# Patient Record
Sex: Male | Born: 1940 | Race: White | Hispanic: No | Smoking: Former smoker
Health system: Southern US, Community
[De-identification: ages and names within clinical notes are randomized; demographics above are authoritative.]

## PROBLEM LIST (undated history)

## (undated) DIAGNOSIS — M1990 Unspecified osteoarthritis, unspecified site: Secondary | ICD-10-CM

## (undated) DIAGNOSIS — T8859XA Other complications of anesthesia, initial encounter: Secondary | ICD-10-CM

## (undated) DIAGNOSIS — K219 Gastro-esophageal reflux disease without esophagitis: Secondary | ICD-10-CM

## (undated) DIAGNOSIS — T4145XA Adverse effect of unspecified anesthetic, initial encounter: Secondary | ICD-10-CM

## (undated) DIAGNOSIS — I82409 Acute embolism and thrombosis of unspecified deep veins of unspecified lower extremity: Secondary | ICD-10-CM

## (undated) DIAGNOSIS — G9520 Unspecified cord compression: Secondary | ICD-10-CM

## (undated) DIAGNOSIS — L0390 Cellulitis, unspecified: Secondary | ICD-10-CM

## (undated) DIAGNOSIS — I2699 Other pulmonary embolism without acute cor pulmonale: Secondary | ICD-10-CM

## (undated) DIAGNOSIS — R1310 Dysphagia, unspecified: Secondary | ICD-10-CM

## (undated) DIAGNOSIS — Z923 Personal history of irradiation: Secondary | ICD-10-CM

## (undated) DIAGNOSIS — L0291 Cutaneous abscess, unspecified: Secondary | ICD-10-CM

## (undated) DIAGNOSIS — C9000 Multiple myeloma not having achieved remission: Secondary | ICD-10-CM

## (undated) HISTORY — PX: BACK SURGERY: SHX140

## (undated) HISTORY — PX: FRACTURE SURGERY: SHX138

## (undated) HISTORY — PX: HEMORRHOID SURGERY: SHX153

## (undated) HISTORY — DX: Personal history of irradiation: Z92.3

---

## 2012-03-09 ENCOUNTER — Inpatient Hospital Stay (HOSPITAL_COMMUNITY): Payer: BC Managed Care – PPO

## 2012-03-09 ENCOUNTER — Encounter (HOSPITAL_BASED_OUTPATIENT_CLINIC_OR_DEPARTMENT_OTHER): Payer: Self-pay

## 2012-03-09 ENCOUNTER — Emergency Department (HOSPITAL_BASED_OUTPATIENT_CLINIC_OR_DEPARTMENT_OTHER): Payer: BC Managed Care – PPO

## 2012-03-09 ENCOUNTER — Inpatient Hospital Stay (HOSPITAL_BASED_OUTPATIENT_CLINIC_OR_DEPARTMENT_OTHER)
Admission: EM | Admit: 2012-03-09 | Discharge: 2012-03-24 | DRG: 401 | Disposition: A | Discharge status: Skilled Nursing Facility | Payer: BC Managed Care – PPO | Attending: Internal Medicine | Admitting: Internal Medicine

## 2012-03-09 DIAGNOSIS — IMO0002 Reserved for concepts with insufficient information to code with codable children: Secondary | ICD-10-CM

## 2012-03-09 DIAGNOSIS — K219 Gastro-esophageal reflux disease without esophagitis: Secondary | ICD-10-CM | POA: Diagnosis present

## 2012-03-09 DIAGNOSIS — M549 Dorsalgia, unspecified: Secondary | ICD-10-CM | POA: Diagnosis present

## 2012-03-09 DIAGNOSIS — E010 Iodine-deficiency related diffuse (endemic) goiter: Secondary | ICD-10-CM | POA: Diagnosis present

## 2012-03-09 DIAGNOSIS — I2510 Atherosclerotic heart disease of native coronary artery without angina pectoris: Secondary | ICD-10-CM | POA: Diagnosis present

## 2012-03-09 DIAGNOSIS — M899 Disorder of bone, unspecified: Secondary | ICD-10-CM | POA: Diagnosis present

## 2012-03-09 DIAGNOSIS — C9000 Multiple myeloma not having achieved remission: Principal | ICD-10-CM | POA: Diagnosis present

## 2012-03-09 DIAGNOSIS — R109 Unspecified abdominal pain: Secondary | ICD-10-CM | POA: Diagnosis present

## 2012-03-09 DIAGNOSIS — I10 Essential (primary) hypertension: Secondary | ICD-10-CM | POA: Diagnosis present

## 2012-03-09 DIAGNOSIS — E669 Obesity, unspecified: Secondary | ICD-10-CM | POA: Diagnosis present

## 2012-03-09 DIAGNOSIS — E049 Nontoxic goiter, unspecified: Secondary | ICD-10-CM | POA: Diagnosis present

## 2012-03-09 DIAGNOSIS — K5900 Constipation, unspecified: Secondary | ICD-10-CM | POA: Diagnosis present

## 2012-03-09 DIAGNOSIS — R7309 Other abnormal glucose: Secondary | ICD-10-CM | POA: Diagnosis present

## 2012-03-09 DIAGNOSIS — C7951 Secondary malignant neoplasm of bone: Secondary | ICD-10-CM

## 2012-03-09 DIAGNOSIS — K449 Diaphragmatic hernia without obstruction or gangrene: Secondary | ICD-10-CM | POA: Diagnosis present

## 2012-03-09 DIAGNOSIS — M4810 Ankylosing hyperostosis [Forestier], site unspecified: Secondary | ICD-10-CM

## 2012-03-09 DIAGNOSIS — E871 Hypo-osmolality and hyponatremia: Secondary | ICD-10-CM | POA: Diagnosis present

## 2012-03-09 HISTORY — DX: Other complications of anesthesia, initial encounter: T88.59XA

## 2012-03-09 HISTORY — DX: Unspecified osteoarthritis, unspecified site: M19.90

## 2012-03-09 HISTORY — DX: Adverse effect of unspecified anesthetic, initial encounter: T41.45XA

## 2012-03-09 HISTORY — DX: Gastro-esophageal reflux disease without esophagitis: K21.9

## 2012-03-09 LAB — COMPREHENSIVE METABOLIC PANEL
ALT: 51 U/L (ref 0–53)
Alkaline Phosphatase: 77 U/L (ref 39–117)
BUN: 13 mg/dL (ref 6–23)
CO2: 24 mEq/L (ref 19–32)
Calcium: 10.2 mg/dL (ref 8.4–10.5)
GFR calc Af Amer: 76 mL/min — ABNORMAL LOW (ref >90)
GFR calc non Af Amer: 66 mL/min — ABNORMAL LOW (ref >90)
Glucose, Bld: 111 mg/dL — ABNORMAL HIGH (ref 70–99)
Potassium: 3.8 mEq/L (ref 3.5–5.1)
Sodium: 135 mEq/L (ref 135–145)
Total Protein: 9.2 g/dL — ABNORMAL HIGH (ref 6.0–8.3)

## 2012-03-09 LAB — URINALYSIS, ROUTINE W REFLEX MICROSCOPIC
Bilirubin Urine: NEGATIVE
Glucose, UA: NEGATIVE mg/dL
Hgb urine dipstick: NEGATIVE
Specific Gravity, Urine: 1.036 — ABNORMAL HIGH (ref 1.005–1.030)
pH: 7 (ref 5.0–8.0)

## 2012-03-09 LAB — CBC
MCV: 91.2 fL (ref 78.0–100.0)
Platelets: 133 10*3/uL — ABNORMAL LOW (ref 150–400)
RDW: 13.5 % (ref 11.5–15.5)
WBC: 5.8 10*3/uL (ref 4.0–10.5)

## 2012-03-09 LAB — CBC WITH DIFFERENTIAL/PLATELET
Eosinophils Absolute: 0.1 10*3/uL (ref 0.0–0.7)
Eosinophils Relative: 2 % (ref 0–5)
Hemoglobin: 14.8 g/dL (ref 13.0–17.0)
Lymphocytes Relative: 24 % (ref 12–46)
Lymphs Abs: 1.4 10*3/uL (ref 0.7–4.0)
MCH: 32.7 pg (ref 26.0–34.0)
MCV: 89.8 fL (ref 78.0–100.0)
Monocytes Relative: 11 % (ref 3–12)
RBC: 4.52 MIL/uL (ref 4.22–5.81)
WBC: 5.9 10*3/uL (ref 4.0–10.5)

## 2012-03-09 LAB — CARDIAC PANEL(CRET KIN+CKTOT+MB+TROPI)
Relative Index: INVALID (ref 0.0–2.5)
Total CK: 80 U/L (ref 7–232)

## 2012-03-09 LAB — CREATININE, SERUM
Creatinine, Ser: 0.88 mg/dL (ref 0.50–1.35)
GFR calc Af Amer: >90 mL/min (ref >90)

## 2012-03-09 LAB — OCCULT BLOOD X 1 CARD TO LAB, STOOL: Fecal Occult Bld: NEGATIVE

## 2012-03-09 LAB — LIPASE, BLOOD: Lipase: 28 U/L (ref 11–59)

## 2012-03-09 MED ORDER — ONDANSETRON HCL 4 MG/2ML IJ SOLN
4.0000 mg | Freq: Once | INTRAMUSCULAR | Status: AC
  Administered 2012-03-09: 4 mg via INTRAVENOUS
  Filled 2012-03-09: qty 2

## 2012-03-09 MED ORDER — OXYCODONE HCL 5 MG PO TABS
5.0000 mg | ORAL_TABLET | ORAL | Status: DC | PRN
  Administered 2012-03-09 – 2012-03-24 (×30): 5 mg via ORAL
  Filled 2012-03-09 (×32): qty 1

## 2012-03-09 MED ORDER — SODIUM CHLORIDE 0.9 % IV BOLUS (SEPSIS)
1000.0000 mL | Freq: Once | INTRAVENOUS | Status: AC
  Administered 2012-03-09: 1000 mL via INTRAVENOUS

## 2012-03-09 MED ORDER — BISACODYL 10 MG RE SUPP: 10.0000 mg | Freq: Every day | RECTAL | Status: DC | PRN

## 2012-03-09 MED ORDER — ALUM & MAG HYDROXIDE-SIMETH 200-200-20 MG/5ML PO SUSP
30.0000 mL | ORAL | Status: DC | PRN
  Administered 2012-03-09 – 2012-03-24 (×11): 30 mL via ORAL
  Filled 2012-03-09 (×11): qty 30

## 2012-03-09 MED ORDER — MAGNESIUM CITRATE PO SOLN
1.0000 | Freq: Once | ORAL | Status: AC
  Administered 2012-03-09: 1 via ORAL

## 2012-03-09 MED ORDER — IOHEXOL 300 MG/ML  SOLN
100.0000 mL | Freq: Once | INTRAMUSCULAR | Status: AC | PRN
  Administered 2012-03-09: 100 mL via INTRAVENOUS

## 2012-03-09 MED ORDER — ONDANSETRON HCL 4 MG/2ML IJ SOLN
4.0000 mg | Freq: Four times a day (QID) | INTRAMUSCULAR | Status: DC | PRN
  Administered 2012-03-10 – 2012-03-24 (×3): 4 mg via INTRAVENOUS
  Filled 2012-03-09 (×3): qty 2

## 2012-03-09 MED ORDER — IOHEXOL 300 MG/ML  SOLN: 20.0000 mL | INTRAMUSCULAR | Status: DC

## 2012-03-09 MED ORDER — ACETAMINOPHEN 325 MG PO TABS: 650.0000 mg | ORAL_TABLET | Freq: Four times a day (QID) | ORAL | Status: DC | PRN

## 2012-03-09 MED ORDER — HYDROMORPHONE HCL PF 1 MG/ML IJ SOLN
1.0000 mg | Freq: Once | INTRAMUSCULAR | Status: AC
  Administered 2012-03-09: 1 mg via INTRAVENOUS
  Filled 2012-03-09: qty 1

## 2012-03-09 MED ORDER — SODIUM CHLORIDE 0.9 % IV SOLN: INTRAVENOUS | Status: DC

## 2012-03-09 MED ORDER — DOCUSATE SODIUM 100 MG PO CAPS
100.0000 mg | ORAL_CAPSULE | Freq: Two times a day (BID) | ORAL | Status: DC
  Administered 2012-03-09: 100 mg via ORAL
  Filled 2012-03-09 (×3): qty 1

## 2012-03-09 MED ORDER — ONDANSETRON HCL 4 MG PO TABS
4.0000 mg | ORAL_TABLET | Freq: Four times a day (QID) | ORAL | Status: DC | PRN
  Administered 2012-03-17: 4 mg via ORAL
  Filled 2012-03-09: qty 1

## 2012-03-09 MED ORDER — MAGNESIUM CITRATE PO SOLN
ORAL | Status: AC
  Filled 2012-03-09: qty 296

## 2012-03-09 MED ORDER — FLEET ENEMA 7-19 GM/118ML RE ENEM: 1.0000 | ENEMA | Freq: Once | RECTAL | Status: AC | PRN

## 2012-03-09 MED ORDER — SENNA 8.6 MG PO TABS
1.0000 | ORAL_TABLET | Freq: Every day | ORAL | Status: DC
  Administered 2012-03-09: 8.6 mg via ORAL
  Filled 2012-03-09: qty 1

## 2012-03-09 MED ORDER — ENOXAPARIN SODIUM 40 MG/0.4ML ~~LOC~~ SOLN: 40.0000 mg | SUBCUTANEOUS | Status: DC

## 2012-03-09 MED ORDER — MORPHINE SULFATE 4 MG/ML IJ SOLN
4.0000 mg | Freq: Once | INTRAMUSCULAR | Status: AC
  Administered 2012-03-09: 4 mg via INTRAVENOUS
  Filled 2012-03-09: qty 1

## 2012-03-09 MED ORDER — ZOLPIDEM TARTRATE 5 MG PO TABS
5.0000 mg | ORAL_TABLET | Freq: Once | ORAL | Status: AC
  Administered 2012-03-09: 5 mg via ORAL
  Filled 2012-03-09: qty 1

## 2012-03-09 MED ORDER — ENOXAPARIN SODIUM 60 MG/0.6ML ~~LOC~~ SOLN
60.0000 mg | SUBCUTANEOUS | Status: DC
  Administered 2012-03-09 – 2012-03-11 (×3): 60 mg via SUBCUTANEOUS
  Filled 2012-03-09 (×4): qty 0.6

## 2012-03-09 MED ORDER — ONDANSETRON HCL 4 MG/2ML IJ SOLN: 4.0000 mg | Freq: Three times a day (TID) | INTRAMUSCULAR | Status: DC | PRN

## 2012-03-09 MED ORDER — ACETAMINOPHEN 650 MG RE SUPP: 650.0000 mg | Freq: Four times a day (QID) | RECTAL | Status: DC | PRN

## 2012-03-09 MED ORDER — PANTOPRAZOLE SODIUM 40 MG PO TBEC
40.0000 mg | DELAYED_RELEASE_TABLET | Freq: Every day | ORAL | Status: DC
  Administered 2012-03-10 – 2012-03-24 (×15): 40 mg via ORAL
  Filled 2012-03-09 (×17): qty 1

## 2012-03-09 MED ORDER — SODIUM CHLORIDE 0.9 % IV SOLN
INTRAVENOUS | Status: DC
  Administered 2012-03-10 – 2012-03-11 (×2): via INTRAVENOUS

## 2012-03-09 NOTE — ED Notes (Signed)
Pt transported to and from CT.

## 2012-03-09 NOTE — ED Notes (Signed)
MD at bedside. 

## 2012-03-09 NOTE — ED Notes (Signed)
Urinal provided and pt drinking PO contrast. Wife at bedside.

## 2012-03-09 NOTE — ED Notes (Signed)
Pt reports abdominal pain x 1 week and constipation.

## 2012-03-09 NOTE — ED Provider Notes (Signed)
History     CSN: [id number]  Arrival date & time 03/09/12  1012   First MD Initiated Contact with Patient 03/09/12 1026      Chief Complaint  Patient presents with  . Abdominal Pain  . Constipation    (Consider location/radiation/quality/duration/timing/severity/associated sxs/prior treatment) HPI Comments: Patient presents with left-sided abdominal pain and constipation for the past week. He states 3 days ago and he felt constipated and took 3 laxatives. He had a bowel movement and then felt better. The pain returned shortly after the left side of his abdomen. He has not had a bowel movement since. Denies vomiting, fever, chest pain or shortness of breath. He has not had pain like this in the past. Denies any diarrhea. He was given pain medication for his back but has not had any more than 3 weeks. The pain is left paraspinal and has substantially improved. Is not radiate. No focal weakness, bowel or bladder incontinence, fever.  The history is provided by the patient.    Past Medical History  Diagnosis Date  . Arthritis   . GERD (gastroesophageal reflux disease)     Past Surgical History  Procedure Date  . Fracture surgery     No family history on file.  History  Substance Use Topics  . Smoking status: Former Games developer  . Smokeless tobacco: Never Used  . Alcohol Use: Yes     moderately      Review of Systems  Constitutional: Negative for fever, activity change and appetite change.  HENT: Negative for congestion and rhinorrhea.   Respiratory: Negative for cough, chest tightness and shortness of breath.   Cardiovascular: Negative for chest pain.  Gastrointestinal: Positive for abdominal pain and constipation. Negative for nausea, vomiting and diarrhea.  Genitourinary: Negative for dysuria.  Musculoskeletal: Negative for back pain.  Skin: Negative for rash.  Neurological: Negative for dizziness, weakness and headaches.    Allergies  Penicillins  Home Medications     Current Outpatient Rx  Name Route Sig Dispense Refill  . NAPROXEN 250 MG PO TABS Oral Take 250 mg by mouth 3 (three) times daily with meals.     . OMEPRAZOLE 20 MG PO CPDR Oral Take 20 mg by mouth daily as needed. GERD      BP 158/77  Pulse 108  Temp 97.8 F (36.6 C) (Oral)  Resp 22  Ht 5\' 8"  (1.727 m)  Wt 250 lb (113.399 kg)  BMI 38.01 kg/m2  SpO2 100%  Physical Exam  Constitutional: He is oriented to person, place, and time. He appears well-developed and well-nourished. No distress.       Uncomfortable  HENT:  Head: Normocephalic and atraumatic.  Mouth/Throat: Oropharynx is clear and moist. No oropharyngeal exudate.  Eyes: Conjunctivae are normal. Pupils are equal, round, and reactive to light.  Neck: Normal range of motion. Neck supple.  Cardiovascular: Normal rate, regular rhythm and normal heart sounds.   No murmur heard.      +2 DP, PT bilaterally, right femoral pulse Difficult to find L femoral pulse, patient reports history of same. Identified on bedside US with doppler wave form. Easily palpable DP and PT in L foot.  Pulmonary/Chest: Effort normal and breath sounds normal. No respiratory distress.  Abdominal: Soft. There is tenderness. There is no rebound and no guarding.       Left lower quadrant tenderness with mild guarding  Genitourinary:       Normal rectal tone No fecal impaction  Musculoskeletal: Normal range of motion.  He exhibits no edema and no tenderness.  Neurological: He is alert and oriented to person, place, and time. No cranial nerve deficit.  Skin: Skin is warm and dry.    ED Course  [place] bedside Date/Time: 03/09/2012 10:59 AM Performed by: Glynn Octave Authorized by: Glynn Octave Consent: Verbal consent obtained. Risks and benefits: risks, benefits and alternatives were discussed Consent given by: patient Local anesthesia used: no Patient sedated: no Patient tolerance: Patient tolerated the procedure well with no immediate  complications.   (including critical care time)  Labs Reviewed  CBC WITH DIFFERENTIAL - Abnormal; Notable for the following:    MCHC 36.5 (*)     Platelets 135 (*)     All other components within normal limits  COMPREHENSIVE METABOLIC PANEL - Abnormal; Notable for the following:    Glucose, Bld 111 (*)     Total Protein 9.2 (*)     AST 38 (*)     GFR calc non Af Amer 66 (*)     GFR calc Af Amer 76 (*)     All other components within normal limits  URINALYSIS, ROUTINE W REFLEX MICROSCOPIC - Abnormal; Notable for the following:    Specific Gravity, Urine 1.036 (*)     Ketones, ur 15 (*)     All other components within normal limits  CARDIAC PANEL(CRET KIN+CKTOT+MB+TROPI)  LACTIC ACID, PLASMA  LIPASE, BLOOD  OCCULT BLOOD X 1 CARD TO LAB, STOOL   Ct Abdomen Pelvis W Contrast  03/09/2012  *RADIOLOGY REPORT*  Clinical Data: LLQ pain, constipation  CT ABDOMEN AND PELVIS WITH CONTRAST  Technique:  Multidetector CT imaging of the abdomen and pelvis was performed following the standard protocol during bolus administration of intravenous contrast.  Contrast: OMNIPAQUE IOHEXOL 300 MG/ML  SOLN  Comparison: None.  Findings: Lung bases are essentially clear.  Small hiatal hernia.  Liver, pancreas, and adrenal glands are within normal limits.  Gallbladder is mildly distended but without associated inflammatory changes by CT.  No intrahepatic or extrahepatic ductal dilatation.  Kidneys are within normal limits.  No hydronephrosis.  No evidence of bowel obstruction.  Normal appendix.  Colonic diverticulosis, without associated inflammatory changes.  No suspicious abdominopelvic lymphadenopathy.  Prostatomegaly, measuring 6.1 cm in transverse dimension.  Bladder is unremarkable.  Expansile lytic lesion within the left posterior T12 vertebral body (series 2/image 26) with suspected cortical disruption posteriorly (sagittal image 69).  Additional suspected lytic lesions anteriorly in the L1 vertebral body  (sagittal image 66) and posteriorly in the L3 vertebral body (sagittal image 63).  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  IMPRESSION: No evidence of bowel obstruction.  Normal appendix.  Colonic diverticulosis, without associated inflammatory changes.  No CT findings to account for the patient's abdominal pain.  Expansile lytic lesion within the left T12 vertebral body with suspected cortical disruption posteriorly.  Additional suspected lytic lesions and L1 and L3.  Primary differential considerations are metastases and myeloma.  Original Report Authenticated By: Charline Bills, M.D.     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 Abdominal pain       MDM  Left lower quadrant abdominal pain with constipation. Recent back injury with narcotic use. No focal neurological deficits.  No fecal impaction on exam. Hemoccult negative. Enlarged prostate nontender.  Labs unremarkable. CT scan does not show explanation for the patient's abdominal pain. Lytic bone lesions however noted.  Concern for metastatic cancer, possibly prostate primary. Patient has no  PCP.  He still having abdominal pain and flank pain radiating to his back. Results discussed with patient and wife. Admission appropriate for symptom control in urgent followup of probable metastases.       Glynn Octave, MD 03/09/12 [phone]

## 2012-03-09 NOTE — ED Notes (Signed)
Pt is unable to void at present time. 

## 2012-03-09 NOTE — ED Notes (Signed)
Report called to Boneta Lucks, RN on unit 1300.

## 2012-03-09 NOTE — H&P (Signed)
[facility] History and Physical  [name] ZOX:[id number] DOB: [birth year] DOA: 03/09/2012  Referring physician: Dr. Glynn Octave PCP: No primary provider on file. Confirms that he does not have a PCP, but has seen Dr. Providence Lanius at [facility] in the past.  Chief Complaint: Left sided abdominal pain, constipation, back pain.  History of Present Illness: Robert Berger is an 71 y.o. male with a PMH of back injury under workman's compensation.  Approximately 1 week ago, began to have abdominal pain and constipation that was unrelieved by TID metamucil and OTC dulcolax use (finally moved bowels after 3 doses).  His abdominal pain improved after his bowels moved, but they returned within 24 hours.  Took a 4th dose of laxative without any relief, took a 5th dose which prompted a bowel movement.  Pain is left lower abdomen, but also has some right lower abdominal pain.  Pain is constant, varies in intensity from achy to sharp, and can be exacerbated by movement.  Went to [facility] this morning, but they were unable to see him so he presented to the [facility] ER.  He was given morphine which has now eased off the pain.    Review of Systems: Constitutional: No fever, no chills;  Appetite diminished x 1 month; +weight loss of 10 lbs in past month, no weight gain.  HEENT: No blurry vision, no diplopia, no pharyngitis, no dysphagia CV: No chest pain, no palpitations.  Resp: No SOB, no cough. GI: No nausea, no vomiting, + GERD symptoms, no diarrhea, no melena, no hematochezia, + constipation.  GU: + mild dysuria, no hematuria, +weak urine stream.  MSK: + myalgias and arthralgias of the lower back.  Neuro:  No headache, no focal neurological deficits, no history of seizures.  Psych: No depression, no anxiety.  Endo: No thyroid disease, no DM, no heat intolerance, no cold intolerance, no polyuria, no polydipsia  Skin: No rashes, no skin lesions.  Heme: No easy bruising, no history of blood diseases.  Past  Medical History Past Medical History  Diagnosis Date  . Arthritis   . GERD (gastroesophageal reflux disease)   . Complication of anesthesia     aspiration during surgery     Past Surgical History Past Surgical History  Procedure Date  . Fracture surgery     ankle  . Hemorrhoid surgery   . Back surgery     47 years ago     Social History: History   Social History  . Marital Status: Single    Spouse Name: N/A    Number of Children: 3  . Years of Education: 14   Occupational History  . Games developer, on medical leave    Social History Main Topics  . Smoking status: Former Smoker    Types: Cigarettes    Quit date: 03/09/1978  . Smokeless tobacco: Never Used  . Alcohol Use: Yes     1 glass of wine or a burbon daily.  . Drug Use: No  . Sexually Active: No   Other Topics Concern  . Not on file   Social History Narrative   Divorced x 2, lives with male partner (x 20 years). Normally able to ambulate without assistance.    Family History:  Family History  Problem Relation Age of Onset  . Cancer Maternal Grandfather   . Cancer Maternal Grandmother     Allergies: Penicillins  Meds: Prior to Admission medications   Medication Sig Start Date End Date Taking? Authorizing Provider  naproxen (  NAPROSYN) 250 MG tablet Take 250 mg by mouth 3 (three) times daily with meals.    Yes Historical Provider, MD  omeprazole (PRILOSEC) 20 MG capsule Take 20 mg by mouth daily as needed. GERD   Yes Historical Provider, MD    Physical Exam: Filed Vitals:   03/09/12 1204 03/09/12 1317 03/09/12 1352 03/09/12 1455  BP: 134/69 158/77 158/77 151/92  Pulse: 77 72 108 77  Temp:   97.8 F (36.6 C) 97.7 F (36.5 C)  TempSrc:    Oral  Resp: 18  22 24   Height:    5\' 8"  (1.727 m)  Weight:    113.399 kg (250 lb)  SpO2: 94% 97% 100% 92%     Physical Exam: Blood pressure 151/92, pulse 77, temperature 97.7 F (36.5 C), temperature source Oral, resp. rate 24, height 5\' 8"   (1.727 m), weight 113.399 kg (250 lb), SpO2 92.00%. BP 151/92  Pulse 77  Temp 97.7 F (36.5 C) (Oral)  Resp 24  Ht 5\' 8"  (1.727 m)  Wt 113.399 kg (250 lb)  BMI 38.01 kg/m2  SpO2 92%  General Appearance:    Alert, cooperative, no distress, appears stated age  Head:    Normocephalic, without obvious abnormality, atraumatic  Eyes:    PERRL, conjunctiva/corneas clear, EOM's intact   Ears:    Normal external ear canals, both ears  Nose:   Nares normal, septum midline, mucosa normal, no drainage   or sinus tenderness  Throat:   Lips, mucosa, and tongue normal; missing teeth with multiple caries  Neck:   Supple, symmetrical, trachea midline, no adenopathy;       thyroid:  No enlargement/tenderness/nodules; no carotid   bruit or JVD  Back:     Symmetric, no curvature, ROM normal, no CVA tenderness  Lungs:     Clear to auscultation bilaterally, respirations unlabored  Chest wall:    No tenderness or deformity  Heart:    Regular rate and rhythm, S1 and S2 normal, no murmur, rub   or gallop  Abdomen:     Soft, non-tender, bowel sounds active all four quadrants,    no masses, no organomegaly  Rectal:    Deferred.  Done by EDP.  Noted to have prostatomegaly  Extremities:   Extremities atraumatic, + hemosiderin deposits, trace edema  Pulses:   2+ and symmetric all extremities  Skin:   Skin color, texture, turgor normal, no rashes or lesions  Lymph nodes:   Cervical, supraclavicular, and axillary nodes normal  Neurologic:   CNII-XII intact. Normal strength    Labs on Admission:  Basic Metabolic Panel:  Lab 03/09/12 1610  NA 135  K 3.8  CL 102  CO2 24  GLUCOSE 111*  BUN 13  CREATININE 1.10  CALCIUM 10.2  MG --  PHOS --   Liver Function Tests:  Lab 03/09/12 1100  AST 38*  ALT 51  ALKPHOS 77  BILITOT 1.2  PROT 9.2*  ALBUMIN 3.9    Lab 03/09/12 1100  LIPASE 28  AMYLASE --   CBC:  Lab 03/09/12 1100  WBC 5.9  NEUTROABS 3.7  HGB 14.8  HCT 40.6  MCV 89.8  PLT 135*     Cardiac Enzymes:  Lab 03/09/12 1100  CKTOTAL 80  CKMB 1.9  CKMBINDEX --  TROPONINI <0.30   Radiological Exams on Admission:  Ct Abdomen Pelvis W Contrast 03/09/2012 IMPRESSION: No evidence of bowel obstruction.  Normal appendix.  Colonic diverticulosis, without associated inflammatory changes.  No CT findings to  account for the patient's abdominal pain.  Expansile lytic lesion within the left T12 vertebral body with suspected cortical disruption posteriorly.  Additional suspected lytic lesions and L1 and L3.  Primary differential considerations are metastases and myeloma.  Original Report Authenticated By: Charline Bills, M.D.    EKG: None obtained.  Assessment/Plan Principal Problem:  *Lytic bone lesions on xray  SPEP, UPEP, immunofixation, beta 2 microglobulin, serum light chains to rule out multiple myeloma.  Bone scan to evaluate other bones.  PSA panel to rule out prostate cancer.  Will get CXR to evaluate for lung cancer, may need CT chest, but multiple myeloma and prostate cancer higher in differential. Active Problems:  Abdominal pain  Likely from borderline hypercalcemia or constipation.  Check ionized calcium and place on bowel regimen.  Hydrate.  Back pain  Recent back strain and new lytic lesions likely the cause.  Pain medication as needed.  Constipation  Bowel regimen initiated.  Code Status: Full Family Communication: Michail Sermon (partner): [phone]; Jayshon Dommer (mother) [phone] Disposition Plan: Home, when stable.  Time spent: 1 hour.  Trine Fread [facility] Pager [phone]  If 7PM-7AM, please contact night-coverage [email or web address] Password [facility] 03/09/2012, 4:43 PM

## 2012-03-09 NOTE — Progress Notes (Signed)
Robert Berger, is a 71 y.o. male,   MRN: [id number]  -  DOB - [birth year]  Outpatient Primary MD for the patient is No primary provider on file.  in for    Chief Complaint  Patient presents with  . Abdominal Pain  . Constipation     Blood pressure 134/69, pulse 77, temperature 97.5 F (36.4 C), temperature source Oral, resp. rate 18, height 5\' 8"  (1.727 m), weight 113.399 kg (250 lb), SpO2 94.00%.  Active Problems:  Abdominal pain  Back pain  Constipation  Lytic bone lesions on xray    71 yo male with no PCP from [facility] being admitted for evaluation of abdominal pain, back pain. Does report constipation of late due to recent narcotic use to control back pain but has not taken for 3 weeks. No n/v/. No fever, sob. No LE weakness. Ct of abd pelvis done in ed and yields expansile lytic lesion within the left T12 vertebral body with suspected cortical disruption posteriorly. Additional suspected lytic lesions and L1 and L3. Primary differential considerations are metastases and myeloma. No bowel obstruction.  ED MD indicated that prostate fairly enlarged on exam and concerned for metastatic CA with possible primary of prostate.  VSS. Admit to medical floor  Gwenyth Bender, NP

## 2012-03-10 ENCOUNTER — Inpatient Hospital Stay (HOSPITAL_COMMUNITY): Payer: BC Managed Care – PPO

## 2012-03-10 ENCOUNTER — Observation Stay (HOSPITAL_COMMUNITY): Payer: BC Managed Care – PPO

## 2012-03-10 LAB — BETA 2 MICROGLOBULIN, SERUM: Beta-2 Microglobulin: 2.34 mg/L — ABNORMAL HIGH (ref 1.01–1.73)

## 2012-03-10 LAB — PSA: PSA: 3.89 ng/mL (ref <=4.00)

## 2012-03-10 LAB — BASIC METABOLIC PANEL
Calcium: 8.7 mg/dL (ref 8.4–10.5)
GFR calc Af Amer: >90 mL/min (ref >90)
GFR calc non Af Amer: 88 mL/min — ABNORMAL LOW (ref >90)
Glucose, Bld: 103 mg/dL — ABNORMAL HIGH (ref 70–99)
Sodium: 132 mEq/L — ABNORMAL LOW (ref 135–145)

## 2012-03-10 LAB — FREE PSA: PSA, Free Pct: 18 % — ABNORMAL LOW (ref >25)

## 2012-03-10 LAB — KAPPA/LAMBDA LIGHT CHAINS
Kappa free light chain: 1.6 mg/dL (ref 0.33–1.94)
Kappa, lambda light chain ratio: 6.15 — ABNORMAL HIGH (ref 0.26–1.65)
Lambda free light chains: 0.26 mg/dL — ABNORMAL LOW (ref 0.57–2.63)

## 2012-03-10 MED ORDER — NAPROXEN 250 MG PO TABS
250.0000 mg | ORAL_TABLET | Freq: Three times a day (TID) | ORAL | Status: DC
  Administered 2012-03-10 – 2012-03-15 (×15): 250 mg via ORAL
  Filled 2012-03-10 (×19): qty 1

## 2012-03-10 MED ORDER — IOHEXOL 300 MG/ML  SOLN
80.0000 mL | Freq: Once | INTRAMUSCULAR | Status: AC | PRN
  Administered 2012-03-10: 80 mL via INTRAVENOUS

## 2012-03-10 MED ORDER — HYOSCYAMINE SULFATE 0.125 MG SL SUBL
0.1250 mg | SUBLINGUAL_TABLET | SUBLINGUAL | Status: DC | PRN
  Administered 2012-03-10: 0.125 mg via SUBLINGUAL
  Filled 2012-03-10 (×2): qty 1

## 2012-03-10 MED ORDER — OXYCODONE HCL 15 MG PO TB12
15.0000 mg | ORAL_TABLET | Freq: Two times a day (BID) | ORAL | Status: DC
  Administered 2012-03-10 – 2012-03-16 (×13): 15 mg via ORAL
  Filled 2012-03-10 (×13): qty 1

## 2012-03-10 MED ORDER — TECHNETIUM TC 99M MEDRONATE IV KIT
25.0000 | PACK | Freq: Once | INTRAVENOUS | Status: AC | PRN
  Administered 2012-03-10: 25 via INTRAVENOUS

## 2012-03-10 MED ORDER — ZOLPIDEM TARTRATE 5 MG PO TABS
5.0000 mg | ORAL_TABLET | Freq: Every evening | ORAL | Status: DC | PRN
  Administered 2012-03-10 – 2012-03-21 (×5): 5 mg via ORAL
  Filled 2012-03-10 (×5): qty 1

## 2012-03-10 MED ORDER — ENSURE COMPLETE PO LIQD
237.0000 mL | Freq: Two times a day (BID) | ORAL | Status: DC
  Administered 2012-03-10 – 2012-03-24 (×19): 237 mL via ORAL

## 2012-03-10 NOTE — Progress Notes (Signed)
Patient has had two episodes of incontinent bowel this a.m. States he was given mag citrate in the E.D.  Bedside Commode provided but patient's second episode occurred while he was sleeping and he did not make it to the [facility].  Patient verbalized, "I wonder if I am losing control of my sphincter."  Will continue to monitor.

## 2012-03-10 NOTE — Progress Notes (Signed)
[facility] PROGRESS NOTE  Robert Berger ZOX:[id number] DOB: 1941-07-25 DOA: 03/09/2012 PCP: No primary provider on file.  Assessment/Plan: Principal Problem:  *Lytic bone lesions on xray   SPEP, UPEP, immunofixation, beta 2 microglobulin, serum light chains to rule out multiple myeloma studies pending.   Bone scan to evaluate other bones.   PSA panel not consistent with prostate cancer.   CXR unremarkable, get CT chest, but multiple myeloma and prostate cancer higher in differential.  Message left with IR to see if they can biopsy one of the bone lesions. Active Problems:  Abdominal pain   Likely from borderline hypercalcemia or constipation. Follow up ionized calcium.  Placed on bowel regimen.   Hydrated.  KVO IVF. Back pain   Recent back strain and new lytic lesions likely the cause.   Start Oxycontin for long acting pain control, continue OxyIR for breakthrough pain.  PT/OT evaluations. Constipation   Continue bowel regimen.  Code Status: Full Family Communication: Wife updated at bedside. Disposition Plan: Home, when stable.   Brief narrative: Robert Berger is a 71 year old man who presented to the hospital on 03/09/12 with a 1 week history of abdominal pain and constipation.  CT scans done in the ER showed lytic lesions of the thoracic and lumbar vertebrae concerning for malignancy so he was referred for admission.  Medical Consultants:  None.  Other Consultants:  None.  Procedures:  None.  Antibiotics:  None.  HPI/Subjective: Robert Berger is still having intermittent abdominal pain.  He has been incontinent of stool after being given magnesium citrate.  He still has some back pain but no dyspnea, cough, N/V.  Objective: Filed Vitals:   03/09/12 1352 03/09/12 1455 03/09/12 2118 03/10/12 0528  BP: 158/77 151/92 122/76 149/95  Pulse: 108 77 78 74  Temp: 97.8 F (36.6 C) 97.7 F (36.5 C) 97.8 F (36.6 C) 98.3 F (36.8 C)  TempSrc:  Oral  Oral Oral  Resp: 22 24 20 20   Height:  5\' 8"  (1.727 m)    Weight:  113.399 kg (250 lb)    SpO2: 100% 92% 95% 95%    Intake/Output Summary (Last 24 hours) at 03/10/12 1238 Last data filed at 03/10/12 0930  Gross per 24 hour  Intake    475 ml  Output    475 ml  Net      0 ml    Exam: Gen:  NAD Cardiovascular:  RRR, No M/R/G Respiratory: Lungs CTAB Gastrointestinal: Abdomen soft, NT/ND with hyperactive bowel sounds. Extremities: Trace edema  Data Reviewed: Basic Metabolic Panel:  Lab 03/10/12 8119 03/09/12 1727 03/09/12 1100  NA 132* -- 135  K 3.9 -- 3.8  CL 98 -- 102  CO2 27 -- 24  GLUCOSE 103* -- 111*  BUN 10 -- 13  CREATININE 0.80 0.88 1.10  CALCIUM 8.7 -- 10.2  MG -- -- --  PHOS -- -- --   GFR Estimated Creatinine Clearance: 103.5 ml/min (by C-G formula based on Cr of 0.8). Liver Function Tests:  Lab 03/09/12 1100  AST 38*  ALT 51  ALKPHOS 77  BILITOT 1.2  PROT 9.2*  ALBUMIN 3.9    Lab 03/09/12 1100  LIPASE 28  AMYLASE --   CBC:  Lab 03/09/12 1727 03/09/12 1100  WBC 5.8 5.9  NEUTROABS -- 3.7  HGB 14.3 14.8  HCT 40.3 40.6  MCV 91.2 89.8  PLT 133* 135*   Cardiac Enzymes:  Lab 03/09/12 1100  CKTOTAL 80  CKMB 1.9  CKMBINDEX --  TROPONINI <0.30     Ref. Range 03/09/2012 17:26  PSA Latest Range: <=4.00 ng/mL 3.89  PSA, Free No range found 0.69  PSA, Free Pct Latest Range: >25 % 18 (L)    Studies:  X-ray Chest Pa And Lateral  03/09/2012  IMPRESSION: Low lung volumes without acute disease.  Original Report Authenticated By: Consuello Bossier, M.D.    Nm Bone Scan Whole Body 03/10/2012  IMPRESSION: Increased radiotracer uptake in the T12 vertebra corresponds with the lytic lesion on recent CT, most compatible with neoplasm, likely metastatic disease or myeloma.  Original Report Authenticated By: Andreas Newport, M.D.    Ct Abdomen Pelvis W Contrast 03/09/2012 IMPRESSION: No evidence of bowel obstruction.  Normal appendix.  Colonic diverticulosis,  without associated inflammatory changes.  No CT findings to account for the patient's abdominal pain.  Expansile lytic lesion within the left T12 vertebral body with suspected cortical disruption posteriorly.  Additional suspected lytic lesions and L1 and L3.  Primary differential considerations are metastases and myeloma.  Original Report Authenticated By: Charline Bills, M.D.    Scheduled Meds:   . docusate sodium  100 mg Oral BID  . enoxaparin (LOVENOX) injection  60 mg Subcutaneous Q24H  .  HYDROmorphone (DILAUDID) injection  1 mg Intravenous Once  . magnesium citrate  1 Bottle Oral Once  . naproxen  250 mg Oral TID WC  . pantoprazole  40 mg Oral Q1200  . senna  1 tablet Oral QHS  . sodium chloride  1,000 mL Intravenous Once  . zolpidem  5 mg Oral Once  . DISCONTD: sodium chloride   Intravenous STAT  . DISCONTD: enoxaparin (LOVENOX) injection  40 mg Subcutaneous Q24H  . DISCONTD: iohexol  20 mL Oral Q1 Hr x 2   Continuous Infusions:   . sodium chloride 100 mL/hr at 03/10/12 0841    Time spent: 35 minutes.   LOS: 1 day   RAMA,CHRISTINA  [facility] Pager [phone].  If 8PM-8AM, please contact night-coverage at [email or web address], password [facility] 03/10/2012, 12:38 PM

## 2012-03-10 NOTE — Progress Notes (Signed)
INITIAL ADULT NUTRITION ASSESSMENT Date: 03/10/2012   Time: 9:51 AM Reason for Assessment: Consult   INTERVENTION: Ensure Complete BID. Recommend MD monitor and replete pt's magnesium and phosphorus as pt at risk of refeeding syndrome r/t pt's report of minimal intake over the past 5 weeks. Potassium WNL. Will monitor.   ASSESSMENT: Male 71 y.o.  Dx: Lytic bone lesions on xray  Food/Nutrition Related Hx: Pt admitted with history of abdominal pain and constipation for 1 week. CT of abdomen/pelvis yesterday showed no evidence of bowel obstruction, however showed expansile lytic lesion within the left T12 vertebral body with suspect cortical disruption posteriorly with other suspected lytic lesions. Pt suspected to have multiple myeloma or prostate CA. Met with pt and wife who report pt with poor appetite for the past 5 weeks with resulting 10 pound unintended weight loss. Pt reports during this time frame sometimes he would go 1-2 days without eating and when he would eat it would be 1 meal/day, typically a frozen dinner. Pt ate 75% of breakfast this morning and is interested in getting an Ensure for additional nutrition. Pt reports improvement in abdominal pain today.   Hx:  Past Medical History  Diagnosis Date  . Arthritis   . GERD (gastroesophageal reflux disease)   . Complication of anesthesia     aspiration during surgery   Related Meds:  Scheduled Meds:   . docusate sodium  100 mg Oral BID  . enoxaparin (LOVENOX) injection  60 mg Subcutaneous Q24H  .  HYDROmorphone (DILAUDID) injection  1 mg Intravenous Once  . magnesium citrate  1 Bottle Oral Once  .  morphine injection  4 mg Intravenous Once  . naproxen  250 mg Oral TID WC  . ondansetron (ZOFRAN) IV  4 mg Intravenous Once  . pantoprazole  40 mg Oral Q1200  . senna  1 tablet Oral QHS  . sodium chloride  1,000 mL Intravenous Once  . zolpidem  5 mg Oral Once  . DISCONTD: sodium chloride   Intravenous STAT  . DISCONTD: enoxaparin  (LOVENOX) injection  40 mg Subcutaneous Q24H  . DISCONTD: iohexol  20 mL Oral Q1 Hr x 2   Continuous Infusions:   . sodium chloride 100 mL/hr at 03/10/12 0841   PRN Meds:.acetaminophen, acetaminophen, alum & mag hydroxide-simeth, bisacodyl, iohexol, ondansetron (ZOFRAN) IV, ondansetron, oxyCODONE, sodium phosphate, DISCONTD: ondansetron (ZOFRAN) IV  Ht: 5\' 8"  (172.7 cm)  Wt: 250 lb (113.399 kg)  Ideal Wt: 154 lb % Ideal Wt: 162  Usual Wt: 260 lb % Usual Wt: 96  Body mass index is 38.01 kg/(m^2). Class II Obesity   Labs:  CMP     Component Value Date/Time   NA 132* 03/10/2012 0320   K 3.9 03/10/2012 0320   CL 98 03/10/2012 0320   CO2 27 03/10/2012 0320   GLUCOSE 103* 03/10/2012 0320   BUN 10 03/10/2012 0320   CREATININE 0.80 03/10/2012 0320   CALCIUM 8.7 03/10/2012 0320   PROT 9.2* 03/09/2012 1100   ALBUMIN 3.9 03/09/2012 1100   AST 38* 03/09/2012 1100   ALT 51 03/09/2012 1100   ALKPHOS 77 03/09/2012 1100   BILITOT 1.2 03/09/2012 1100   GFRNONAA 88* 03/10/2012 0320   GFRAA >90 03/10/2012 0320    Intake/Output Summary (Last 24 hours) at 03/10/12 1414 Last data filed at 03/10/12 0930  Gross per 24 hour  Intake    475 ml  Output    475 ml  Net      0 ml  Last BM - 03/10/12, diarrhea  Diet Order: General   IVF:    sodium chloride Last Rate: 100 mL/hr at 03/10/12 0841    Estimated Nutritional Needs:   Kcal: 1750-2100 Protein: 85-105g Fluid: 1.7-2.1L  NUTRITION DIAGNOSIS: -Increased nutrient needs (NI-5.1).  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   EDUCATION NEEDS: -No education needs identified at this time   Dietitian #: [phone]  DOCUMENTATION CODES Per approved criteria  -Severe malnutrition in the context of acute illness or injury -Obesity Unspecified    Marshall Cork 03/10/2012, 9:51 AM

## 2012-03-10 NOTE — Progress Notes (Addendum)
PT Cancellation Note  Treatment cancelled today due to medical issues with patient which prohibited therapy.  Pt reports he has too much pain to get out of bed today.  He states he has too much " internal abdominal pain " and low back muscles have been working too hard when he has to get up to bedside commode.  Pt is concerned about continuing his workman's comp physical therapy  Donnetta Hail 03/10/2012, 3:14 PM

## 2012-03-10 NOTE — Progress Notes (Signed)
Patient ID: Robert Berger, male   DOB: 1940/11/26, 71 y.o.   MRN: [id number] Pt tent scheduled for CT guided biopsy of L1 lytic lesion on 8/5. Imaging studies reviewed by Dr. Leonel Ramsay. Exam- Chest -CTA bilat, heart-RRR, abd - obese, soft, +BS, mild diffuse tenderness.  Filed Vitals:   03/09/12 1455 03/09/12 2118 03/10/12 0528 03/10/12 1415  BP: 151/92 122/76 149/95 122/69  Pulse: 77 78 74 73  Temp: 97.7 F (36.5 C) 97.8 F (36.6 C) 98.3 F (36.8 C) 98.4 F (36.9 C)  TempSrc: Oral Oral Oral Oral  Resp: 24 20 20 20   Height: 5\' 8"  (1.727 m)     Weight: 250 lb (113.399 kg)     SpO2: 92% 95% 95% 91%    Past Medical History  Diagnosis Date  . Arthritis   . GERD (gastroesophageal reflux disease)   . Complication of anesthesia     aspiration during surgery   Past Surgical History  Procedure Date  . Fracture surgery     ankle  . Hemorrhoid surgery   . Back surgery     47 years ago   X-ray Chest Pa And Lateral   03/09/2012  *RADIOLOGY REPORT*  Clinical Data: No chest complaints.  Osseous lytic lesions on CT of the abdomen.  CHEST - 2 VIEW  Comparison: Abdominal pelvic CT of earlier today.  Findings: Mild osteopenia.  Maintenance of thoracic vertebral body height.  Suspect ankylosing spondylitis or diffuse idiopathic skeletal hyperostosis involving the thoracic spine.  Low-volume AP frontal view.  Patient minimally rotated to the right. Normal heart size.  Normal mediastinal contours. No pleural effusion or pneumothorax.  Mildly low lung volumes. Clear lungs.  No other focal osseous lytic lesions are identified.  IMPRESSION: Low lung volumes without acute disease.  Original Report Authenticated By: Consuello Bossier, M.D.   Nm Bone Scan Whole Body  03/10/2012  *RADIOLOGY REPORT*  Clinical Data: Lumbar spine pain.  Evaluate lytic lesions.  NUCLEAR MEDICINE WHOLE BODY BONE SCINTIGRAPHY  Technique:  Whole body anterior and posterior images were obtained approximately 3 hours after intravenous  injection of radiopharmaceutical.  Radiopharmaceutical: CURIE TC-MDP TECHNETIUM TC 47M MEDRONATE IV KIT  Comparison: CT 03/09/2012.  Findings: Increased uptake is present in the T12 vertebra, greater on the right than left.  This corresponds with the lytic lesions seen on recent CT.  This is best seen on the posterior views with increased uptake projected over the pedicles and extension into the posterior elements.  Degenerative changes are present at the knees, shoulders, and ankles.  There is a small right posterior focus of increased uptake in the right ribs, apparently in the right posterior ninth rib. This could be degenerative or represent a tiny focus of metastatic disease not visualized on prior CT.  Retrospective review of the CT was performed and no definite lytic lesion was present.  IMPRESSION: Increased radiotracer uptake in the T12 vertebra corresponds with the lytic lesion on recent CT, most compatible with neoplasm, likely metastatic disease or myeloma.  Original Report Authenticated By: Andreas Newport, M.D.   Ct Abdomen Pelvis W Contrast  03/09/2012  *RADIOLOGY REPORT*  Clinical Data: LLQ pain, constipation  CT ABDOMEN AND PELVIS WITH CONTRAST  Technique:  Multidetector CT imaging of the abdomen and pelvis was performed following the standard protocol during bolus administration of intravenous contrast.  Contrast: OMNIPAQUE IOHEXOL 300 MG/ML  SOLN  Comparison: None.  Findings: Lung bases are essentially clear.  Small hiatal hernia.  Liver, pancreas,  and adrenal glands are within normal limits.  Gallbladder is mildly distended but without associated inflammatory changes by CT.  No intrahepatic or extrahepatic ductal dilatation.  Kidneys are within normal limits.  No hydronephrosis.  No evidence of bowel obstruction.  Normal appendix.  Colonic diverticulosis, without associated inflammatory changes.  No suspicious abdominopelvic lymphadenopathy.  Prostatomegaly, measuring 6.1 cm in  transverse dimension.  Bladder is unremarkable.  Expansile lytic lesion within the left posterior T12 vertebral body (series 2/image 26) with suspected cortical disruption posteriorly (sagittal image 69).  Additional suspected lytic lesions anteriorly in the L1 vertebral body (sagittal image 66) and posteriorly in the L3 vertebral body (sagittal image 63).  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  IMPRESSION: No evidence of bowel obstruction.  Normal appendix.  Colonic diverticulosis, without associated inflammatory changes.  No CT findings to account for the patient's abdominal pain.  Expansile lytic lesion within the left T12 vertebral body with suspected cortical disruption posteriorly.  Additional suspected lytic lesions and L1 and L3.  Primary differential considerations are metastases and myeloma.  Original Report Authenticated By: Charline Bills, M.D.   Results for orders placed during the hospital encounter of 03/09/12  CBC WITH DIFFERENTIAL      Component Value Range   WBC 5.9  4.0 - 10.5 K/uL   RBC 4.52  4.22 - 5.81 MIL/uL   Hemoglobin 14.8  13.0 - 17.0 g/dL   HCT 40.9  81.1 - 91.4 %   MCV 89.8  78.0 - 100.0 fL   MCH 32.7  26.0 - 34.0 pg   MCHC 36.5 (*) 30.0 - 36.0 g/dL   RDW 78.2  95.6 - 21.3 %   Platelets 135 (*) 150 - 400 K/uL   Neutrophils Relative 62  43 - 77 %   Neutro Abs 3.7  1.7 - 7.7 K/uL   Lymphocytes Relative 24  12 - 46 %   Lymphs Abs 1.4  0.7 - 4.0 K/uL   Monocytes Relative 11  3 - 12 %   Monocytes Absolute 0.6  0.1 - 1.0 K/uL   Eosinophils Relative 2  0 - 5 %   Eosinophils Absolute 0.1  0.0 - 0.7 K/uL   Basophils Relative 1  0 - 1 %   Basophils Absolute 0.0  0.0 - 0.1 K/uL  COMPREHENSIVE METABOLIC PANEL      Component Value Range   Sodium 135  135 - 145 mEq/L   Potassium 3.8  3.5 - 5.1 mEq/L   Chloride 102  96 - 112 mEq/L   CO2 24  19 - 32 mEq/L   Glucose, Bld 111 (*) 70 - 99 mg/dL   BUN 13  6  - 23 mg/dL   Creatinine, Ser 0.86  0.50 - 1.35 mg/dL   Calcium 57.8  8.4 - 46.9 mg/dL   Total Protein 9.2 (*) 6.0 - 8.3 g/dL   Albumin 3.9  3.5 - 5.2 g/dL   AST 38 (*) 0 - 37 U/L   ALT 51  0 - 53 U/L   Alkaline Phosphatase 77  39 - 117 U/L   Total Bilirubin 1.2  0.3 - 1.2 mg/dL   GFR calc non Af Amer 66 (*) >90 mL/min   GFR calc Af Amer 76 (*) >90 mL/min  CARDIAC PANEL(CRET KIN+CKTOT+MB+TROPI)      Component Value Range   Total CK 80  7 - 232 U/L   CK, MB 1.9  0.3 - 4.0 ng/mL   Troponin  I <0.30  <0.30 ng/mL   Relative Index RELATIVE INDEX IS INVALID  0.0 - 2.5  LACTIC ACID, PLASMA      Component Value Range   Lactic Acid, Venous 1.3  0.5 - 2.2 mmol/L  URINALYSIS, ROUTINE W REFLEX MICROSCOPIC      Component Value Range   Color, Urine YELLOW  YELLOW   APPearance CLEAR  CLEAR   Specific Gravity, Urine 1.036 (*) 1.005 - 1.030   pH 7.0  5.0 - 8.0   Glucose, UA NEGATIVE  NEGATIVE mg/dL   Hgb urine dipstick NEGATIVE  NEGATIVE   Bilirubin Urine NEGATIVE  NEGATIVE   Ketones, ur 15 (*) NEGATIVE mg/dL   Protein, ur NEGATIVE  NEGATIVE mg/dL   Urobilinogen, UA 0.2  0.0 - 1.0 mg/dL   Nitrite NEGATIVE  NEGATIVE   Leukocytes, UA NEGATIVE  NEGATIVE  LIPASE, BLOOD      Component Value Range   Lipase 28  11 - 59 U/L  OCCULT BLOOD X 1 CARD TO LAB, STOOL      Component Value Range   Fecal Occult Bld NEGATIVE    CBC      Component Value Range   WBC 5.8  4.0 - 10.5 K/uL   RBC 4.42  4.22 - 5.81 MIL/uL   Hemoglobin 14.3  13.0 - 17.0 g/dL   HCT 16.1  09.6 - 04.5 %   MCV 91.2  78.0 - 100.0 fL   MCH 32.4  26.0 - 34.0 pg   MCHC 35.5  30.0 - 36.0 g/dL   RDW 40.9  81.1 - 91.4 %   Platelets 133 (*) 150 - 400 K/uL  CREATININE, SERUM      Component Value Range   Creatinine, Ser 0.88  0.50 - 1.35 mg/dL   GFR calc non Af Amer 84 (*) >90 mL/min   GFR calc Af Amer >90  >90 mL/min  IMMUNOFIXATION ELECTROPHORESIS, URINE (WITH TOT PROT)      Component Value Range   Time RANDOM     Volume, Urine  RANDOM     Total Protein, Urine 5.3     Total Protein, Urine-Ur/day NOT CALC  10 - 140 mg/day   Albumin, U PENDING     Alpha 1, Urine PENDING     Alpha 2, Urine PENDING     Beta, Urine PENDING     Gamma Globulin, Urine PENDING     Free Kappa Lt Chains,Ur 4.13 (*) 0.14 - 2.42 mg/dL   Free Lt Chn Excr Rate NOT CALC     Free Lambda Lt Chains,Ur 0.28  0.02 - 0.67 mg/dL   Free Lambda Excretion/Day NOT CALC     Free Kappa/Lambda Ratio 14.75 (*) 2.04 - 10.37 ratio   Immunofixation, Urine PENDING    PSA      Component Value Range   PSA 3.89  <=4.00 ng/mL  FREE PSA      Component Value Range   PSA, Free 0.69     PSA, Free Pct 18 (*) >25 %  BETA 2 MICROGLOBULINE, SERUM      Component Value Range   Beta-2 Microglobulin 2.34 (*) 1.01 - 1.73 mg/L  KAPPA/LAMBDA LIGHT CHAINS      Component Value Range   Kappa free light chain 1.60  0.33 - 1.94 mg/dL   Lamda free light chains 0.26 (*) 0.57 - 2.63 mg/dL   Kappa, lamda light chain ratio 6.15 (*) 0.26 - 1.65  BASIC METABOLIC PANEL      Component Value  Range   Sodium 132 (*) 135 - 145 mEq/L   Potassium 3.9  3.5 - 5.1 mEq/L   Chloride 98  96 - 112 mEq/L   CO2 27  19 - 32 mEq/L   Glucose, Bld 103 (*) 70 - 99 mg/dL   BUN 10  6 - 23 mg/dL   Creatinine, Ser 5.28  0.50 - 1.35 mg/dL   Calcium 8.7  8.4 - 41.3 mg/dL   GFR calc non Af Amer 88 (*) >90 mL/min   GFR calc Af Amer >90  >90 mL/min    A/P: Pt with hx of abd/back pain and scattered bony lytic lesions on recent imaging suspicious for mets vs multiple myeloma. Plan is for CT guided biopsy of L1 lytic lesion on 8/5. Details/risks of above d/w pt/family with their understanding and consent.

## 2012-03-10 NOTE — Progress Notes (Signed)
Family requesting to speak with a financial counselor re: affordability of medications now that Workmen's Compensation is not longer able to pay.

## 2012-03-10 NOTE — Progress Notes (Signed)
   CARE MANAGEMENT NOTE 03/10/2012  Patient:  Robert Berger, Robert Berger   Account Number:  [id number]  Date Initiated:  03/10/2012  Documentation initiated by:  PEARSON,COOKIE  Subjective/Objective Assessment:   71 y.o. male with a PMH of back injury under workman's compensation. Presented with cco Left sided abdominal pain, constipation, back pain.     Action/Plan:   Plan to dc home with wife.   Anticipated DC Date:  03/11/2012   Anticipated DC Plan:  HOME/SELF CARE      DC Planning Services  CM consult      Choice offered to / List presented to:             Status of service:  In process, will continue to follow Medicare Important Message given?  NA - LOS <3 / Initial given by admissions (If response is "NO", the following Medicare IM given date fields will be blank) Date Medicare IM given:   Date Additional Medicare IM given:    Discharge Disposition:  HOME/SELF CARE  Per UR Regulation:  Reviewed for med. necessity/level of care/duration of stay  If discussed at Long Length of Stay Meetings, dates discussed:    Comments:  03/10/12 MPearson, RN, BSN Spoke with pt and wife at bedside concerning discharge plans, PCP and medications. Pt and wife states there is problem getting his medications. Pt has [facility]. Pt did not want PCP at present time related to pt being under workman's compensation. Pt was encouraged to call to make an appointment with a PCP, [facility]/ pt's wife. Pt states he will continue with [facility] for workman's comp.

## 2012-03-11 ENCOUNTER — Observation Stay (HOSPITAL_COMMUNITY): Payer: BC Managed Care – PPO

## 2012-03-11 DIAGNOSIS — M4810 Ankylosing hyperostosis [Forestier], site unspecified: Secondary | ICD-10-CM | POA: Diagnosis present

## 2012-03-11 DIAGNOSIS — K449 Diaphragmatic hernia without obstruction or gangrene: Secondary | ICD-10-CM | POA: Diagnosis present

## 2012-03-11 DIAGNOSIS — E010 Iodine-deficiency related diffuse (endemic) goiter: Secondary | ICD-10-CM | POA: Diagnosis present

## 2012-03-11 DIAGNOSIS — I2510 Atherosclerotic heart disease of native coronary artery without angina pectoris: Secondary | ICD-10-CM | POA: Diagnosis present

## 2012-03-11 DIAGNOSIS — K219 Gastro-esophageal reflux disease without esophagitis: Secondary | ICD-10-CM | POA: Diagnosis present

## 2012-03-11 LAB — LACTATE DEHYDROGENASE: LDH: 205 U/L (ref 94–250)

## 2012-03-11 MED ORDER — METHOCARBAMOL 500 MG PO TABS
500.0000 mg | ORAL_TABLET | Freq: Four times a day (QID) | ORAL | Status: DC | PRN
  Administered 2012-03-12 – 2012-03-22 (×11): 500 mg via ORAL
  Filled 2012-03-11 (×11): qty 1

## 2012-03-11 NOTE — Progress Notes (Signed)
[facility] PROGRESS NOTE  Roddy Bellamy ZOX:[id number] DOB: 08/19/1940 DOA: 03/09/2012 PCP: No primary provider on file.  Assessment/Plan: Principal Problem:  *Lytic bone lesions on xray   SPEP, UPEP, immunofixation, beta 2 microglobulin, serum light chains to rule out multiple myeloma studies pending. Add quantitative immunoglobulins.  Bone scan as noted below.   PSA panel not consistent with prostate cancer.   CXR unremarkable, CT chest negative for lung cancer.  Differential includes multiple myeloma, lymphoma (no palpable lymphadenopathy), or metastatic cancer from some other primary.  Preliminary SPEP/UPEP results abnormal.  Check LDH.  Check thyroid ultrasound.  IR to biopsy L1 lytic lesion. Active Problems: Thyromegaly, probable goiter  Check TSH.  Thyroid ultrasound. Hiatal hernia and fluid filled esophagus / GERD  Outpatient GI follow up for consideration of EGD.  PPI started for symptoms of GERD. 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   Start Oxycontin for long acting pain control, continue OxyIR for breakthrough pain.  PT/OT evaluations pending. Coronary atherosclerosis  Noted incidentally on CT of chest.  Will need outpatient evaluation.  Abdominal pain   Likely from borderline hypercalcemia or constipation. Follow up ionized calcium.  Placed on bowel regimen.   Hydrated.  KVO IVF. Constipation   Continue bowel regimen.  Code Status: Full Family Communication: Wife updated at bedside. Disposition Plan: Home, when stable.   Brief narrative: Mr. Vea is a 71 year old man who presented to the hospital on 03/09/12 with a 1 week history of abdominal pain and constipation.  CT scans done in the ER showed lytic lesions of the thoracic and lumbar vertebrae concerning for malignancy so he was referred for admission.  Medical  Consultants:  None.  Other Consultants:  None.  Procedures:  None.  Antibiotics:  None.  HPI/Subjective: Mr. Skolnick is still having intermittent abdominal pain and back spasms.  He was unable to participate in PT yesterday because of the pain.  He has been incontinent of stool but denies loss of sensation to the genitals or anus.  No dyspnea, cough, N/V.  Appetite is poor.  Objective: Filed Vitals:   03/10/12 0528 03/10/12 1415 03/10/12 2050 03/11/12 0525  BP: 149/95 122/69 112/68 143/79  Pulse: 74 73 72 84  Temp: 98.3 F (36.8 C) 98.4 F (36.9 C) 98.6 F (37 C) 98.1 F (36.7 C)  TempSrc: Oral Oral Oral Oral  Resp: 20 20 17 18   Height:      Weight:      SpO2: 95% 91% 92% 93%    Intake/Output Summary (Last 24 hours) at 03/11/12 0946 Last data filed at 03/11/12 0500  Gross per 24 hour  Intake   1330 ml  Output    650 ml  Net    680 ml    Exam: Gen:  NAD Cardiovascular:  RRR, No M/R/G Respiratory: Lungs CTAB Gastrointestinal: Abdomen soft, NT/ND with hyperactive bowel sounds. Extremities: Trace edema  Data Reviewed: Basic Metabolic Panel:  Lab 03/10/12 8119 03/09/12 1727 03/09/12 1100  NA 132* -- 135  K 3.9 -- 3.8  CL 98 -- 102  CO2 27 -- 24  GLUCOSE 103* -- 111*  BUN 10 -- 13  CREATININE 0.80 0.88 1.10  CALCIUM 8.7 -- 10.2  MG -- -- --  PHOS -- -- --   GFR Estimated Creatinine Clearance: 103.5 ml/min (by C-G formula based on Cr of 0.8). Liver Function Tests:  Lab 03/09/12  1100  AST 38*  ALT 51  ALKPHOS 77  BILITOT 1.2  PROT 9.2*  ALBUMIN 3.9    Lab 03/09/12 1100  LIPASE 28  AMYLASE --   CBC:  Lab 03/09/12 1727 03/09/12 1100  WBC 5.8 5.9  NEUTROABS -- 3.7  HGB 14.3 14.8  HCT 40.3 40.6  MCV 91.2 89.8  PLT 133* 135*   Cardiac Enzymes:  Lab 03/09/12 1100  CKTOTAL 80  CKMB 1.9  CKMBINDEX --  TROPONINI <0.30     Ref. Range 03/09/2012 17:26  PSA Latest Range: <=4.00 ng/mL 3.89  PSA, Free No range found 0.69  PSA, Free Pct  Latest Range: >25 % 18 (L)    Ref. Range 03/09/2012 19:52  Immunofixation, Urine No range found PENDING  Time-UPE24 No range found RANDOM  Volume, Urine-UPE24 No range found RANDOM  Total Protein, Urine-UPE24 No range found 5.3  Total Protein, Urine-Ur/day Latest Range: 10-140 mg/day NOT CALC  ALBUMIN, U No range found PENDING  Alpha 1, Urine No range found PENDING  Alpha 2, Urine No range found PENDING  Beta, Urine No range found PENDING  Gamma Globulin, Urine No range found PENDING  Free Kappa Lt Chains,Ur Latest Range: 0.14-2.42 mg/dL 8.29 (H)  Free Lt Chn Excr Rate No range found NOT CALC  Free Lambda Lt Chains,Ur Latest Range: 0.02-0.67 mg/dL 5.62  Free Lambda Excretion/Day No range found NOT CALC  Free Kappa/Lambda Ratio Latest Range: 2.04-10.37 ratio 14.75 (H)    Studies:  X-ray Chest Pa And Lateral  03/09/2012  IMPRESSION: Low lung volumes without acute disease.  Original Report Authenticated By: Consuello Bossier, M.D.    Nm Bone Scan Whole Body 03/10/2012  IMPRESSION: Increased radiotracer uptake in the T12 vertebra corresponds with the lytic lesion on recent CT, most compatible with neoplasm, likely metastatic disease or myeloma.  Original Report Authenticated By: Andreas Newport, M.D.    Ct Abdomen Pelvis W Contrast 03/09/2012 IMPRESSION: No evidence of bowel obstruction.  Normal appendix.  Colonic diverticulosis, without associated inflammatory changes.  No CT findings to account for the patient's abdominal pain.  Expansile lytic lesion within the left T12 vertebral body with suspected cortical disruption posteriorly.  Additional suspected lytic lesions and L1 and L3.  Primary differential considerations are metastases and myeloma.  Original Report Authenticated By: Charline Bills, M.D.    Ct Chest W Contrast 03/10/2012   IMPRESSION: 1.  Negative for lung cancer. 2.  Soft tissue density osteolytic lesions occupying T9 and T10 vertebral bodies, with possible small amount of  extraosseous extension to the right of T10.  Although nonspecific, lymphoma, myeloma and metastatic disease of the primary considerations. Small metastatic lesion in the right sternal manubrium also present. 3.  Small hiatal hernia and fluid distended esophagus.  There may be some esophageal thickening distally.  Consider follow-up endoscopy.  Original Report Authenticated By: Andreas Newport, M.D.    Scheduled Meds:    . enoxaparin (LOVENOX) injection  60 mg Subcutaneous Q24H  . feeding supplement  237 mL Oral BID BM  . naproxen  250 mg Oral TID WC  . oxyCODONE  15 mg Oral Q12H  . pantoprazole  40 mg Oral Q1200  . DISCONTD: docusate sodium  100 mg Oral BID  . DISCONTD: senna  1 tablet Oral QHS   Continuous Infusions:    . sodium chloride 20 mL/hr (03/10/12 1345)    Time spent: 35 minutes.   LOS: 2 days   Markeise Mathews  [facility] Pager [phone].  If  8PM-8AM, please contact night-coverage at [email or web address], password [email or web address] 03/11/2012, 9:46 AM

## 2012-03-11 NOTE — Evaluation (Signed)
Occupational Therapy Evaluation Patient Details Name: Damen Windsor MRN: [id number] DOB: 08-Jun-1941 Today's Date: 03/11/2012 Time: 4098-1191 OT Time Calculation (min): 20 min  OT Assessment / Plan / Recommendation Clinical Impression  This 71 year old man was admitted with back pain.  He was undergoing OP PT and is now found to have lytic bone lesions.  He will benefit from skilled OT to increase activity tolerance within pain tolerance while in acute setting.      OT Assessment  Patient needs continued OT Services    Follow Up Recommendations  No OT follow up (likely)    Barriers to Discharge      Equipment Recommendations   3:1 commode    Recommendations for Other Services    Frequency  Min 2X/week    Precautions / Restrictions Precautions Precautions: Back (for comfort) Precaution Comments: pt was getting OPPT due to LB strain prior adm/worker's comp injury Restrictions Weight Bearing Restrictions: No   Pertinent Vitals/Pain Back pain present at rest increased with bed mobility and sitting and ambulation.  Not rated.  Premedicated and repositioned    ADL  Eating/Feeding: Simulated;Independent (to set up:  depending on angle of HOB) Grooming: Simulated;Set up Where Assessed - Grooming: Unsupported sitting Upper Body Bathing: Simulated;Set up Where Assessed - Upper Body Bathing: Supine, head of bed flat Lower Body Bathing: Simulated;Minimal assistance Where Assessed - Lower Body Bathing: Rolling right and/or left Upper Body Dressing: Simulated;Set up Where Assessed - Upper Body Dressing: Supine, head of bed flat Lower Body Dressing: Performed;Minimal assistance Where Assessed - Lower Body Dressing: Rolling right and/or left Toilet Transfer: Simulated;Min guard (bed walked to opposite side of bed) Toilet Transfer Method: Sit to stand Toileting - Clothing Manipulation and Hygiene: Simulated;Minimal assistance Where Assessed - Glass blower/designer Manipulation and Hygiene:  Standing Equipment Used: Rolling walker Transfers/Ambulation Related to ADLs: min guard walking in room.  Pt's pain increases with sitting and standing activities:  sitting moreso than standing.  Able to sit only a few minutes either EOB or in straight back chair.    ADL Comments: Significant other has been helping with ADLs but he can cross legs to adjust socks, hook pants.    OT Diagnosis: Generalized weakness;Acute pain  OT Problem List: Decreased strength;Decreased activity tolerance;Pain;Decreased knowledge of use of DME or AE OT Treatment Interventions: Self-care/ADL training;Therapeutic activities;Patient/family education;DME and/or AE instruction   OT Goals Acute Rehab OT Goals OT Goal Formulation: With patient Time For Goal Achievement: 03/25/12 Potential to Achieve Goals: Good ADL Goals Pt Will Perform Grooming: with supervision;Standing at sink;Supported ADL Goal: Grooming - Progress: Goal set today Pt Will Transfer to Toilet: with supervision;Ambulation;3-in-1 ADL Goal: Toilet Transfer - Progress: Goal set today Pt Will Perform Toileting - Hygiene: with min assist;Standing at 3-in-1/toilet (all aspects) ADL Goal: Toileting - Hygiene - Progress: Goal set today Miscellaneous OT Goals Miscellaneous OT Goal #1: Pt will verbalize vs. demonstrate independently the use of AE to promote independence and minimize back pain OT Goal: Miscellaneous Goal #1 - Progress: Goal set today  Visit Information  Last OT Received On: 03/11/12 Assistance Needed: +1 PT/OT Co-Evaluation/Treatment: Yes    Subjective Data  Subjective: I'll wait on the short acting pain medicine and see how I do Patient Stated Goal: decrease pain   Prior Functioning  Vision/Perception  Home Living Lives With: Significant other (significant other) Available Help at Discharge: Family;Available PRN/intermittently Type of Home: House Home Access: Stairs to enter Entrance Stairs-Number of Steps: 1-2 Home Layout:  One level Bathroom  Shower/Tub: Health visitor: Standard Home Adaptive Equipment: Straight cane Prior Function Level of Independence: Independent Able to Take Stairs?: Yes Driving: Yes Communication Communication: No difficulties      Cognition  Overall Cognitive Status: Appears within functional limits for tasks assessed/performed Arousal/Alertness: Awake/alert Orientation Level: Appears intact for tasks assessed Behavior During Session: [facility] for tasks performed    Extremity/Trunk Assessment Right Upper Extremity Assessment RUE ROM/Strength/Tone: Within functional levels Left Upper Extremity Assessment LUE ROM/Strength/Tone: Within functional levels     Mobility Bed Mobility Bed Mobility: Rolling Left;Left Sidelying to Sit;Sit to Sidelying Right Rolling Left: 5: Supervision Left Sidelying to Sit: 4: Min assist;HOB flat Sit to Sidelying Right: 4: Min guard Details for Bed Mobility Assistance: cues for hand log roll Transfers Sit to Stand: 4: Min guard;From bed Stand to Sit: 4: Min guard;To bed Details for Transfer Assistance: pt places hands on RW to stand due to pain   Exercise    Balance    End of Session OT - End of Session Activity Tolerance: Patient limited by pain Patient left: in bed;with call bell/phone within reach;with family/visitor present  GO Functional Assessment Tool Used: clinical judgment Functional Limitation: Self care Self Care Current Status (O9629): At least 1 percent but less than 20 percent impaired, limited or restricted Self Care Goal Status (B2841): At least 1 percent but less than 20 percent impaired, limited or restricted   Adithya Difrancesco 03/11/2012, 1:13 PM Marica Otter, OTR/L [phone] 03/11/2012

## 2012-03-11 NOTE — Evaluation (Signed)
Physical Therapy Evaluation Patient Details Name: Robert Berger MRN: [id number] DOB: 27-May-1941 Today's Date: 03/11/2012 Time: 4696-2952 PT Time Calculation (min): 22 min  PT Assessment / Plan / Recommendation Clinical Impression   Robert Berger is a 71 year old man who presented to the hospital on 03/09/12 with a 1 week history of abdominal pain and constipation.  CT scans done in the ER showed lytic lesions of the thoracic and lumbar vertebrae concerning for malignancy so he was referred for admission.;pt will benefit from PT to maximize independence for home with support of significant other prn    PT Assessment  Patient needs continued PT services    Follow Up Recommendations  Home health PT    Barriers to Discharge        Equipment Recommendations  Rolling walker with 5" wheels    Recommendations for Other Services     Frequency Min 3X/week    Precautions / Restrictions Precautions Precautions: Back (for comfort) Precaution Comments: pt was getting OPPT due to LB strain prior adm/worker's comp injury Restrictions Weight Bearing Restrictions: No   Pertinent Vitals/Pain       Mobility  Bed Mobility Bed Mobility: Rolling Left;Left Sidelying to Sit;Sit to Sidelying Right Rolling Left: 5: Supervision Left Sidelying to Sit: 4: Min assist;HOB flat Sit to Sidelying Right: 4: Min guard Details for Bed Mobility Assistance: cues for hand log roll Transfers Transfers: Sit to Stand;Stand to Sit Sit to Stand: 4: Min guard;From bed Stand to Sit: 4: Min guard;To bed Details for Transfer Assistance: pt places hands on RW to stand due to pain Ambulation/Gait Ambulation/Gait Assistance: 4: Min guard;4: Min Environmental consultant (Feet): 10 Feet Assistive device: Rolling walker Gait Pattern: Step-to pattern    Exercises     PT Diagnosis: Difficulty walking  PT Problem List: Decreased activity tolerance;Decreased mobility;Pain;Decreased knowledge of use of DME;Decreased  knowledge of precautions PT Treatment Interventions: DME instruction;Gait training;Stair training;Functional mobility training;Therapeutic activities;Therapeutic exercise;Patient/family education   PT Goals Acute Rehab PT Goals PT Goal Formulation: With patient Time For Goal Achievement: 03/25/12 Potential to Achieve Goals: Good Pt will Roll Supine to Right Side: with supervision PT Goal: Rolling Supine to Right Side - Progress: Goal set today Pt will go Supine/Side to Sit: with supervision PT Goal: Supine/Side to Sit - Progress: Goal set today Pt will go Sit to Stand: with modified independence PT Goal: Sit to Stand - Progress: Goal set today Pt will Ambulate: 51 - 150 feet PT Goal: Ambulate - Progress: Goal set today Pt will Go Up / Down Stairs: 1-2 stairs;with min assist;with least restrictive assistive device PT Goal: Up/Down Stairs - Progress: Goal set today  Visit Information  Last PT Received On: 03/11/12 Assistance Needed: +1    Subjective Data  Subjective: pain is not too bad right now Patient Stated Goal: get better   Prior Functioning  Home Living Lives With: Significant other (significant other) Available Help at Discharge: Family;Available PRN/intermittently Type of Home: House Home Access: Stairs to enter [employer] of Steps: 1-2 Home Layout: One level Bathroom Shower/Tub: Health visitor: Standard Home Adaptive Equipment: Straight cane Prior Function Level of Independence: Independent Able to Take Stairs?: Yes Driving: Yes Communication Communication: No difficulties    Cognition  Overall Cognitive Status: Appears within functional limits for tasks assessed/performed Arousal/Alertness: Awake/alert Orientation Level: Appears intact for tasks assessed Behavior During Session: [facility] for tasks performed    Extremity/Trunk Assessment Right Upper Extremity Assessment RUE ROM/Strength/Tone: Within functional levels Left Upper  Extremity Assessment LUE ROM/Strength/Tone: Within functional levels Right Lower Extremity Assessment RLE ROM/Strength/Tone: [facility] for tasks assessed Left Lower Extremity Assessment LLE ROM/Strength/Tone: WFL for tasks assessed   Balance    End of Session PT - End of Session Activity Tolerance: Patient limited by pain Patient left: in bed;with call bell/phone within reach;with family/visitor present  GP Functional Assessment Tool Used: clinical judgement Functional Limitation: Mobility: Walking and moving around Mobility: Walking and Moving Around Current Status (R6045): At least 20 percent but less than 40 percent impaired, limited or restricted Mobility: Walking and Moving Around Goal Status [phone]): At least 1 percent but less than 20 percent impaired, limited or restricted   [facility] 03/11/2012, 1:30 PM

## 2012-03-12 ENCOUNTER — Observation Stay (HOSPITAL_COMMUNITY): Payer: BC Managed Care – PPO

## 2012-03-12 NOTE — Progress Notes (Signed)
[facility] PROGRESS NOTE  Robert Berger ZOX:[id number] DOB: [birth year] DOA: 03/09/2012 PCP: No primary provider on file.  Assessment/Plan: Principal Problem:  *Lytic bone lesions on xray   SPEP, UPEP, immunofixation, beta 2 microglobulin, serum light chains, quantitative immunoglobulins preliminary, final results still pending.    Bone scan as noted below.   PSA panel not consistent with prostate cancer.   CXR unremarkable, CT chest negative for lung cancer.  Differential includes multiple myeloma, lymphoma (no palpable lymphadenopathy), or metastatic cancer from some other primary.  Preliminary SPEP/UPEP results mildly abnormal.  LDH not elevated so lymphoma less likely.  Thyroid ultrasound shows multiple nodules, for biopsy of these per IR.  IR to biopsy L1 lytic lesion. 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  Thyroid ultrasound done 03/11/12, shows multiple nodules, will request IR biopsy. Hiatal hernia and fluid filled esophagus / GERD  Outpatient GI follow up for consideration of EGD, but may need sooner if no malignant source found with current work up.  PPI started for symptoms of GERD. 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   Continue Oxycontin for long acting pain control, continue OxyIR for breakthrough pain.  Add K pad.  PT/OT evaluations done, home health PT and a 3:1 commode recommended. Coronary atherosclerosis  Noted incidentally on CT of chest.  Will need outpatient evaluation.  Abdominal pain   Likely from borderline hypercalcemia or constipation. Follow up ionized calcium.  Placed on bowel regimen.   Hydrated.  KVO IVF. Constipation   Continue bowel regimen.  Code Status: Full Family Communication: Wife updated at bedside. Disposition Plan: Home, when stable.   Brief narrative: Robert Berger is a 71 year  old man who presented to the hospital on 03/09/12 with a 1 week history of abdominal pain and constipation.  CT scans done in the ER showed lytic lesions of the thoracic and lumbar vertebrae concerning for malignancy so he was referred for admission.  Medical Consultants:  None.  Other Consultants:  Physical therapy: HHPT recommended.  Occupational therapy: No further OT.  3:1 commode recommended.  Procedures:  None.  Antibiotics:  None.  HPI/Subjective: Robert Berger is still having intermittent abdominal pain and lower back spasms.   Appetite is poor.  Stated the Robaxin made him nauseated.  No vomiting.  Objective: Filed Vitals:   03/11/12 0525 03/11/12 1431 03/11/12 2044 03/12/12 0539  BP: 143/79 115/69 156/85 144/70  Pulse: 84 74 74 74  Temp: 98.1 F (36.7 C) 98.8 F (37.1 C) 98 F (36.7 C) 97.9 F (36.6 C)  TempSrc: Oral Oral Oral Oral  Resp: 18 16 18 18   Height:      Weight:      SpO2: 93% 93% 94% 97%    Intake/Output Summary (Last 24 hours) at 03/12/12 1048 Last data filed at 03/12/12 0900  Gross per 24 hour  Intake    840 ml  Output   1400 ml  Net   -560 ml    Exam: Gen:  NAD Cardiovascular:  RRR, No M/R/G Respiratory: Lungs CTAB Gastrointestinal: Abdomen soft, NT/ND with hyperactive bowel sounds. Extremities: Trace edema  Data Reviewed: Basic Metabolic Panel:  Lab 03/10/12 8119 03/09/12 1727 03/09/12 1100  NA 132* -- 135  K 3.9 -- 3.8  CL 98 -- 102  CO2 27 -- 24  GLUCOSE 103* -- 111*  BUN 10 -- 13  CREATININE 0.80 0.88 1.10  CALCIUM 8.7 -- 10.2  MG -- -- --  PHOS -- -- --   GFR Estimated Creatinine Clearance: 103.5 ml/min (by C-G formula based on Cr of 0.8). Liver Function Tests:  Lab 03/09/12 1100  AST 38*  ALT 51  ALKPHOS 77  BILITOT 1.2  PROT 9.2*  ALBUMIN 3.9    Ref. Range 03/11/2012 11:55  LDH Latest Range: 94-250 U/L 205    Lab 03/09/12 1100  LIPASE 28  AMYLASE --   CBC:  Lab 03/09/12 1727 03/09/12 1100  WBC 5.8  5.9  NEUTROABS -- 3.7  HGB 14.3 14.8  HCT 40.3 40.6  MCV 91.2 89.8  PLT 133* 135*   Cardiac Enzymes:  Lab 03/09/12 1100  CKTOTAL 80  CKMB 1.9  CKMBINDEX --  TROPONINI <0.30     Ref. Range 03/09/2012 17:26  PSA Latest Range: <=4.00 ng/mL 3.89  PSA, Free No range found 0.69  PSA, Free Pct Latest Range: >25 % 18 (L)     Ref. Range 03/11/2012 11:55  TSH Latest Range: 0.350-4.500 uIU/mL 0.815     Ref. Range 03/09/2012 17:26  Beta-2 Microglobulin Latest Range: 1.01-1.73 mg/L 2.34 (H)  Kappa free light chain Latest Range: 0.33-1.94 mg/dL 1.61  Lamda free light chains Latest Range: 0.57-2.63 mg/dL 0.96 (L)  Kappa, lamda light chain ratio Latest Range: 0.26-1.65  6.15 (H)   Studies:  X-ray Chest Pa And Lateral  03/09/2012  IMPRESSION: Low lung volumes without acute disease.  Original Report Authenticated By: Consuello Bossier, M.D.    Nm Bone Scan Whole Body 03/10/2012  IMPRESSION: Increased radiotracer uptake in the T12 vertebra corresponds with the lytic lesion on recent CT, most compatible with neoplasm, likely metastatic disease or myeloma.  Original Report Authenticated By: Andreas Newport, M.D.    Ct Abdomen Pelvis W Contrast 03/09/2012 IMPRESSION: No evidence of bowel obstruction.  Normal appendix.  Colonic diverticulosis, without associated inflammatory changes.  No CT findings to account for the patient's abdominal pain.  Expansile lytic lesion within the left T12 vertebral body with suspected cortical disruption posteriorly.  Additional suspected lytic lesions and L1 and L3.  Primary differential considerations are metastases and myeloma.  Original Report Authenticated By: Charline Bills, M.D.    Ct Chest W Contrast 03/10/2012   IMPRESSION: 1.  Negative for lung cancer. 2.  Soft tissue density osteolytic lesions occupying T9 and T10 vertebral bodies, with possible small amount of extraosseous extension to the right of T10.  Although nonspecific, lymphoma, myeloma and metastatic disease of  the primary considerations. Small metastatic lesion in the right sternal manubrium also present. 3.  Small hiatal hernia and fluid distended esophagus.  There may be some esophageal thickening distally.  Consider follow-up endoscopy.  Original Report Authenticated By: Andreas Newport, M.D.    US Soft Tissue Head/neck 03/11/2012  IMPRESSION:  Multiple bilateral thyroid nodules, dominant lesion on the left. Findings meet consensus criteria for biopsy.  Ultrasound-guided fine needle aspiration should be considered, as per the consensus statement: Management of Thyroid Nodules Detected at [place]:  Society of Radiologists in Ultrasound Consensus Conference Statement. Radiology 2005; [id number].  Original Report Authenticated By: Thora Lance III, M.D.    Scheduled Meds:    . enoxaparin (LOVENOX) injection  60 mg Subcutaneous Q24H  . feeding supplement  237 mL Oral BID BM  . naproxen  250 mg Oral TID WC  . oxyCODONE  15 mg Oral Q12H  . pantoprazole  40 mg Oral Q1200   Continuous Infusions:    .  sodium chloride 20 mL/hr at 03/11/12 1509    Time spent: 35 minutes.   LOS: 3 days   RAMA,CHRISTINA  [facility] Pager [phone].  If 8PM-8AM, please contact night-coverage at [email or web address], password [email or web address] 03/12/2012, 10:48 AM

## 2012-03-13 ENCOUNTER — Observation Stay (HOSPITAL_COMMUNITY): Payer: BC Managed Care – PPO

## 2012-03-13 DIAGNOSIS — R894 Abnormal immunological findings in specimens from other organs, systems and tissues: Secondary | ICD-10-CM

## 2012-03-13 LAB — UIFE/LIGHT CHAINS/TP QN, 24-HR UR
Albumin, U: DETECTED
Alpha 2, Urine: DETECTED — AB
Beta, Urine: DETECTED — AB
Gamma Globulin, Urine: DETECTED — AB

## 2012-03-13 LAB — PROTEIN ELECTROPHORESIS, SERUM
Alpha-2-Globulin: 12.4 % — ABNORMAL HIGH (ref 7.1–11.8)
Gamma Globulin: 29.9 % — ABNORMAL HIGH (ref 11.1–18.8)
M-Spike, %: 2.42 g/dL
Total Protein ELP: 9 g/dL — ABNORMAL HIGH (ref 6.0–8.3)

## 2012-03-13 LAB — CBC
HCT: 39 % (ref 39.0–52.0)
MCHC: 34.4 g/dL (ref 30.0–36.0)
Platelets: 138 10*3/uL — ABNORMAL LOW (ref 150–400)
RDW: 13.6 % (ref 11.5–15.5)

## 2012-03-13 LAB — PROTIME-INR: INR: 0.93 (ref 0.00–1.49)

## 2012-03-13 MED ORDER — FENTANYL CITRATE 0.05 MG/ML IJ SOLN
INTRAMUSCULAR | Status: AC | PRN
  Administered 2012-03-13 (×2): 50 ug via INTRAVENOUS
  Administered 2012-03-13: 100 ug via INTRAVENOUS

## 2012-03-13 MED ORDER — DEXTROSE-NACL 5-0.9 % IV SOLN
INTRAVENOUS | Status: DC
  Administered 2012-03-13 – 2012-03-14 (×2): via INTRAVENOUS

## 2012-03-13 MED ORDER — SODIUM CHLORIDE 0.9 % IV SOLN
90.0000 mg | Freq: Once | INTRAVENOUS | Status: AC
  Administered 2012-03-13: 90 mg via INTRAVENOUS
  Filled 2012-03-13: qty 10

## 2012-03-13 MED ORDER — MIDAZOLAM HCL 5 MG/5ML IJ SOLN
INTRAMUSCULAR | Status: AC | PRN
  Administered 2012-03-13: 1 mg via INTRAVENOUS
  Administered 2012-03-13: 2 mg via INTRAVENOUS

## 2012-03-13 MED ORDER — BISACODYL 5 MG PO TBEC: 5.0000 mg | DELAYED_RELEASE_TABLET | Freq: Every day | ORAL | Status: DC | PRN

## 2012-03-13 MED ORDER — VITAMINS A & D EX OINT
TOPICAL_OINTMENT | CUTANEOUS | Status: AC
  Filled 2012-03-13: qty 5

## 2012-03-13 MED ORDER — DOCUSATE SODIUM 100 MG PO CAPS
100.0000 mg | ORAL_CAPSULE | Freq: Two times a day (BID) | ORAL | Status: DC
  Administered 2012-03-13 – 2012-03-24 (×23): 100 mg via ORAL
  Filled 2012-03-13 (×24): qty 1

## 2012-03-13 NOTE — ED Notes (Signed)
Pt transported via bed with RN to [place] for thyroid Bx.

## 2012-03-13 NOTE — Consult Note (Signed)
#   [id number] is consult note.  Pete E.

## 2012-03-13 NOTE — Progress Notes (Signed)
[facility] PROGRESS NOTE  Robert Berger ZOX:[id number] DOB: October 13, 1940 DOA: 03/09/2012 PCP: No primary provider on file.  Assessment/Plan: Principal Problem:  *Lytic bone lesions on xray   SPEP, UPEP, immunofixation, beta 2 microglobulin, serum light chains, quantitative immunoglobulins preliminary, final results still pending.    Bone scan as noted below.   PSA panel not consistent with prostate cancer.   CXR unremarkable, CT chest negative for lung cancer.  Differential includes multiple myeloma, lymphoma (no palpable lymphadenopathy), or metastatic cancer from some other primary.  Preliminary SPEP/UPEP results mildly abnormal.  LDH not elevated so lymphoma less likely.  Thyroid ultrasound shows multiple nodules, for biopsy of these per IR.  IR to biopsy L1 lytic lesion. 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  Thyroid ultrasound done 03/11/12, shows multiple nodules, for biopsy. Hiatal hernia and fluid filled esophagus / GERD  Outpatient GI follow up for consideration of EGD, but may need sooner if no malignant source found with current work up.  PPI started for symptoms of GERD. 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   Continue Oxycontin for long acting pain control, continue OxyIR for breakthrough pain.  Add K pad.  PT/OT evaluations done, home health PT and a 3:1 commode recommended. Coronary atherosclerosis  Noted incidentally on CT of chest.  Will need outpatient evaluation.  Abdominal pain   Likely from borderline hypercalcemia or constipation. Follow up ionized calcium.  Placed on bowel regimen.   Hydrated.  KVO IVF. Constipation   Continue bowel regimen: Colace BID started, dulcolax PRN moderate constipation.  Code Status: Full Family Communication: Wife updated at bedside. Disposition Plan: Home, when  stable.   Brief narrative: Robert Berger is a 71 year old man who presented to the hospital on 03/09/12 with a 1 week history of abdominal pain and constipation.  CT scans done in the ER showed lytic lesions of the thoracic and lumbar vertebrae concerning for malignancy so he was referred for admission.  Medical Consultants:  None.  Other Consultants:  Physical therapy: HHPT recommended.  Occupational therapy: No further OT.  3:1 commode recommended.  Procedures:  None.  Antibiotics:  None.  HPI/Subjective: Robert Berger is still having intermittent abdominal pain and lower back spasms.  He feels a bit constipated today.  Reluctant to take laxatives because of his episodes of fecal incontinence early in his hospital stay.     Objective: Filed Vitals:   03/12/12 0539 03/12/12 1447 03/12/12 2040 03/13/12 0540  BP: 144/70 130/76 139/88 136/70  Pulse: 74 76 103 73  Temp: 97.9 F (36.6 C) 98.6 F (37 C) 98 F (36.7 C) 98.6 F (37 C)  TempSrc: Oral Oral Axillary Oral  Resp: 18 18 16 16   Height:      Weight:      SpO2: 97% 94% 93% 92%    Intake/Output Summary (Last 24 hours) at 03/13/12 1044 Last data filed at 03/13/12 0755  Gross per 24 hour  Intake    160 ml  Output   1225 ml  Net  -1065 ml    Exam: Gen:  NAD Cardiovascular:  RRR, No M/R/G Respiratory: Lungs CTAB Gastrointestinal: Abdomen soft, NT/ND with normal active bowel sounds. Extremities: Trace edema  Data Reviewed: Basic Metabolic Panel:  Lab 03/10/12 8119 03/09/12 1727 03/09/12 1100  NA 132* -- 135  K 3.9 -- 3.8  CL 98 -- 102  CO2 27 --  24  GLUCOSE 103* -- 111*  BUN 10 -- 13  CREATININE 0.80 0.88 1.10  CALCIUM 8.7 -- 10.2  MG -- -- --  PHOS -- -- --   GFR Estimated Creatinine Clearance: 103.5 ml/min (by C-G formula based on Cr of 0.8). Liver Function Tests:  Lab 03/09/12 1100  AST 38*  ALT 51  ALKPHOS 77  BILITOT 1.2  PROT 9.2*  ALBUMIN 3.9    Ref. Range 03/11/2012 11:55  LDH Latest  Range: 94-250 U/L 205    Lab 03/09/12 1100  LIPASE 28  AMYLASE --   CBC:  Lab 03/13/12 0428 03/09/12 1727 03/09/12 1100  WBC 4.4 5.8 5.9  NEUTROABS -- -- 3.7  HGB 13.4 14.3 14.8  HCT 39.0 40.3 40.6  MCV 93.1 91.2 89.8  PLT 138* 133* 135*   Cardiac Enzymes:  Lab 03/09/12 1100  CKTOTAL 80  CKMB 1.9  CKMBINDEX --  TROPONINI <0.30     Ref. Range 03/09/2012 17:26  PSA Latest Range: <=4.00 ng/mL 3.89  PSA, Free No range found 0.69  PSA, Free Pct Latest Range: >25 % 18 (L)     Ref. Range 03/11/2012 11:55  TSH Latest Range: 0.350-4.500 uIU/mL 0.815     Ref. Range 03/09/2012 17:26  Beta-2 Microglobulin Latest Range: 1.01-1.73 mg/L 2.34 (H)  Kappa free light chain Latest Range: 0.33-1.94 mg/dL 1.61  Lamda free light chains Latest Range: 0.57-2.63 mg/dL 0.96 (L)  Kappa, lamda light chain ratio Latest Range: 0.26-1.65  6.15 (H)   Studies:  X-ray Chest Pa And Lateral  03/09/2012  IMPRESSION: Low lung volumes without acute disease.  Original Report Authenticated By: Consuello Bossier, M.D.    Nm Bone Scan Whole Body 03/10/2012  IMPRESSION: Increased radiotracer uptake in the T12 vertebra corresponds with the lytic lesion on recent CT, most compatible with neoplasm, likely metastatic disease or myeloma.  Original Report Authenticated By: Andreas Newport, M.D.    Ct Abdomen Pelvis W Contrast 03/09/2012 IMPRESSION: No evidence of bowel obstruction.  Normal appendix.  Colonic diverticulosis, without associated inflammatory changes.  No CT findings to account for the patient's abdominal pain.  Expansile lytic lesion within the left T12 vertebral body with suspected cortical disruption posteriorly.  Additional suspected lytic lesions and L1 and L3.  Primary differential considerations are metastases and myeloma.  Original Report Authenticated By: Charline Bills, M.D.    Ct Chest W Contrast 03/10/2012   IMPRESSION: 1.  Negative for lung cancer. 2.  Soft tissue density osteolytic lesions occupying T9  and T10 vertebral bodies, with possible small amount of extraosseous extension to the right of T10.  Although nonspecific, lymphoma, myeloma and metastatic disease of the primary considerations. Small metastatic lesion in the right sternal manubrium also present. 3.  Small hiatal hernia and fluid distended esophagus.  There may be some esophageal thickening distally.  Consider follow-up endoscopy.  Original Report Authenticated By: Andreas Newport, M.D.    US Soft Tissue Head/neck 03/11/2012  IMPRESSION:  Multiple bilateral thyroid nodules, dominant lesion on the left. Findings meet consensus criteria for biopsy.  Ultrasound-guided fine needle aspiration should be considered, as per the consensus statement: Management of Thyroid Nodules Detected at [place]:  Society of Radiologists in Ultrasound Consensus Conference Statement. Radiology 2005; [id number].  Original Report Authenticated By: Thora Lance III, M.D.    Scheduled Meds:    . feeding supplement  237 mL Oral BID BM  . naproxen  250 mg Oral TID WC  . oxyCODONE  15 mg Oral Q12H  .  pantoprazole  40 mg Oral Q1200  . DISCONTD: enoxaparin (LOVENOX) injection  60 mg Subcutaneous Q24H   Continuous Infusions:    . sodium chloride 15 mL/hr at 03/12/12 1501    Time spent: 35 minutes.   LOS: 4 days   RAMA,CHRISTINA  [facility] Pager [phone].  If 8PM-8AM, please contact night-coverage at [email or web address], password [facility] 03/13/2012, 10:44 AM

## 2012-03-13 NOTE — Progress Notes (Signed)
  Subjective: Pt awaiting biopsies ;states he is hungry and thirsty;has intermittent back /abd pain; recent imaging studies reviewed with Dr. Bonnielee Haff and he feels that T12 lesion is best site to biopsy; we will also proceed with biopsy of a dominant left thyroid nodule as requested by Dr. Darnelle Catalan.  Objective: Vital signs in last 24 hours: Temp:  [98 F (36.7 C)-98.6 F (37 C)] 98.6 F (37 C) (08/05 0540) Pulse Rate:  [73-103] 73  (08/05 0540) Resp:  [16-18] 16  (08/05 0540) BP: (130-139)/(70-88) 136/70 mmHg (08/05 0540) SpO2:  [92 %-94 %] 92 % (08/05 0540) Last BM Date: 03/11/12  Intake/Output from previous day: 08/04 0701 - 08/05 0700 In: 520 [P.O.:360; I.V.:160] Out: 1175 [Urine:1175] Intake/Output this shift: Total I/O In: -  Out: 300 [Urine:300]  Chest- CTA bilat, heart-RRR  Lab Results:   [facility] 03/13/12 0428  WBC 4.4  HGB 13.4  HCT 39.0  PLT 138*   BMET No results found for this basename: NA:2,K:2,CL:2,CO2:2,GLUCOSE:2,BUN:2,CREATININE:2,CALCIUM:2 in the last 72 hours PT/INR  Basename 03/13/12 0428  LABPROT 12.7  INR 0.93   ABG No results found for this basename: PHART:2,PCO2:2,PO2:2,HCO3:2 in the last 72 hours  Studies/Results: US Soft Tissue Head/neck  03/11/2012  *RADIOLOGY REPORT*  Clinical Data: Asymmetric enlargement on recent CT  THYROID ULTRASOUND  Technique: Ultrasound examination of the thyroid gland and adjacent soft tissues was performed.  Comparison:  CT 03/10/2012  Findings:  Right thyroid lobe:  24 x 26 x 56 mm, inhomogeneous Left thyroid lobe:  24 x 35 x 59 mm Isthmus:  5 mm in thickness  Focal nodules:  7 x 8 x 10 mm complex mostly solid, mid-right 7 x 10 x 15 mm complex mostly solid, inferior right 8 x 12 x 14 mm solid, superior left 16 x 19 x 23 mm complex mostly solid, mid-left 12 x 14 x 14 mm hypoechoic solid, inferior left There are least three additional smaller bilateral and isthmic nodules measuring less than 1 cm.  Lymphadenopathy:  None  visualized.  IMPRESSION:  Multiple bilateral thyroid nodules, dominant lesion on the left. Findings meet consensus criteria for biopsy.  Ultrasound-guided fine needle aspiration should be considered, as per the consensus statement: Management of Thyroid Nodules Detected at [place]:  Society of Radiologists in Ultrasound Consensus Conference Statement. Radiology 2005; [id number].  Original Report Authenticated By: Osa Craver, M.D.    Anti-infectives: Anti-infectives    None      Assessment/Plan: Pt with scattered bony lytic lesions and thyroid nodules; plan is for CT guided biopsy of T12 lytic lesion and dominant left thyroid nodule. Details/risks of above d/w pt/wife with their understanding and consent.   LOS: 4 days    ALLRED,D [facility] 03/13/2012

## 2012-03-13 NOTE — Progress Notes (Signed)
Patient back from biopsies.  Resting comfortably.  VSS. Biopsy sites clean,dry intact. Will continue to monitor

## 2012-03-13 NOTE — ED Notes (Signed)
Pt arrived in [place].

## 2012-03-13 NOTE — Procedures (Signed)
L thyr nodule No comp

## 2012-03-13 NOTE — Progress Notes (Signed)
Nutrition Brief Note  Diet: NPO for biopsy, however previously on regular diet yesterday with intake 75-85% of meals  - Pt currently downstairs for thyroid biopsy, however nursing reports pt ate excellent yesterday. Magnesium and phosphorus not ordered. Pt with diarrhea on 8/3. Recommend adding Florastor.   Will continue to monitor intake.   Dietitian# [phone]

## 2012-03-13 NOTE — Progress Notes (Signed)
OT Note:  Pt was out having biopsy.  Will reattempt tomorrow as schedule permits.  [place]essmer, Flasher [phone] 03/13/2012

## 2012-03-13 NOTE — Progress Notes (Signed)
Physical Therapy Treatment Patient Details Name: Robert Berger MRN: [id number] DOB: Nov 14, 1940 Today's Date: 03/13/2012 Time: 6213-0865 PT Time Calculation (min): 12 min  PT Assessment / Plan / Recommendation Comments on Treatment Session  patient with back pain, moves impulsively.  unable to sit on BSC long enough for BM    Follow Up Recommendations  Home health PT    Barriers to Discharge        Equipment Recommendations  Rolling walker with 5" wheels    Recommendations for Other Services    Frequency Min 3X/week   Plan Discharge plan remains appropriate    Precautions / Restrictions Restrictions Weight Bearing Restrictions: No   Pertinent Vitals/Pain Increased pain with mobility so that pt was unable to progress gait    Mobility  Bed Mobility Bed Mobility: Rolling Left;Left Sidelying to Sit;Sit to Sidelying Right Rolling Left: 5: Supervision Left Sidelying to Sit: 4: Min assist;HOB flat Sit to Sidelying Right: 4: Min guard Details for Bed Mobility Assistance: pt directs how to assist him Transfers Transfers: Sit to Stand;Stand to Sit Sit to Stand: From bed;5: Supervision;From chair/3-in-1 Stand to Sit: To bed;5: Supervision;To chair/3-in-1 Details for Transfer Assistance: pt moves impulsively.   Ambulation/Gait Assistive device: Rolling walker Ambulation/Gait Assistance Details: pt deferred due to back pain General Gait Details: pt impulsive.  limited by back pain and spasm today . Awaiting procedure today to assess bone lesions Wheelchair Mobility Wheelchair Mobility: No    Exercises     PT Diagnosis:    PT Problem List:   PT Treatment Interventions:     PT Goals Acute Rehab PT Goals PT Goal: Rolling Supine to Right Side - Progress: Progressing toward goal PT Goal: Supine/Side to Sit - Progress: Progressing toward goal PT Goal: Sit to Stand - Progress: Progressing toward goal  Visit Information  Last PT Received On: 03/13/12 Assistance Needed: +1      Subjective Data  Subjective: I have to have a BM   Cognition  Overall Cognitive Status: Appears within functional limits for tasks assessed/performed Arousal/Alertness: Awake/alert Orientation Level: Appears intact for tasks assessed Behavior During Session: Robert Berger for tasks performed    Balance     End of Session PT - End of Session Activity Tolerance: Patient limited by pain Patient left: in bed;with call bell/phone within reach;with family/visitor present Nurse Communication: Mobility status   GP     Donnetta Hail 03/13/2012, 12:03 PM

## 2012-03-14 ENCOUNTER — Observation Stay (HOSPITAL_COMMUNITY): Payer: BC Managed Care – PPO

## 2012-03-14 ENCOUNTER — Inpatient Hospital Stay (HOSPITAL_COMMUNITY): Payer: BC Managed Care – PPO

## 2012-03-14 ENCOUNTER — Other Ambulatory Visit (HOSPITAL_COMMUNITY): Payer: BC Managed Care – PPO

## 2012-03-14 LAB — IGG, IGA, IGM: IgM, Serum: 40 mg/dL — ABNORMAL LOW (ref 41–251)

## 2012-03-14 LAB — PROTEIN ELECTROPH W RFLX QUANT IMMUNOGLOBULINS
Alpha-2-Globulin: 11.6 % (ref 7.1–11.8)
Gamma Globulin: 29.6 % — ABNORMAL HIGH (ref 11.1–18.8)
M-Spike, %: 1.95 g/dL

## 2012-03-14 MED ORDER — MAGNESIUM CITRATE PO SOLN
1.0000 | Freq: Once | ORAL | Status: AC
  Administered 2012-03-14: 1 via ORAL
  Filled 2012-03-14: qty 296

## 2012-03-14 MED ORDER — SALINE SPRAY 0.65 % NA SOLN
1.0000 | NASAL | Status: DC | PRN
  Filled 2012-03-14: qty 44

## 2012-03-14 MED ORDER — LORAZEPAM 2 MG/ML IJ SOLN
1.0000 mg | Freq: Once | INTRAMUSCULAR | Status: AC
  Administered 2012-03-14: 1 mg via INTRAVENOUS
  Filled 2012-03-14: qty 1

## 2012-03-14 NOTE — Progress Notes (Signed)
OT Note:  Pt declined OT this am due to pain whenever he gets up and moves around.    He has tests scheduled later.  Will check another time.  [place], OTR/L [phone] 03/14/2012

## 2012-03-14 NOTE — Progress Notes (Signed)
Pt's mother changed appointment made by CM for PCP to Dr. Oliver Barre on 05/03/12.  Dr. Myna Hidalgo states he will continue to follow pt from an Oncology prospective.  Spoke with pt concerning admission status and he understands that he is observation. Plan for pt to discharge home, follow up with Worker's Comp PT, MD and follow up MD appointments. mp

## 2012-03-14 NOTE — Progress Notes (Signed)
Physical Therapy Treatment note (late entry) 03/13/12 1157  PT Visit Information  Assistance Needed +1  PT Time Calculation  PT Start Time 1058  PT Stop Time 1110  PT Time Calculation (min) 12 min  Subjective Data  Subjective I have to have a BM  Cognition  Overall Cognitive Status Appears within functional limits for tasks assessed/performed  Arousal/Alertness Awake/alert  Orientation Level Appears intact for tasks assessed  Behavior During Session [facility] for tasks performed  Bed Mobility  Bed Mobility Rolling Left;Left Sidelying to Sit;Sit to Sidelying Right  Rolling Left 5: Supervision  Left Sidelying to Sit 4: Min assist;HOB flat  Sit to Sidelying Right 4: Min guard  Details for Bed Mobility Assistance pt directs how to assist him  Transfers  Transfers Sit to Stand;Stand to Sit  Sit to Stand From bed;5: Supervision;From chair/3-in-1  Stand to Sit To bed;5: Supervision;To chair/3-in-1  Details for Transfer Assistance pt moves impulsively.    Ambulation/Gait  Assistive device Rolling walker  Ambulation/Gait Assistance Details pt deferred due to back pain  General Gait Details pt impulsive.  limited by back pain and spasm today . Awaiting procedure today to assess bone lesions  Wheelchair Mobility  Wheelchair Mobility No  PT - End of Session  Activity Tolerance Patient limited by pain  Patient left in bed;with call bell/phone within reach;with family/visitor present  Nurse Communication Mobility status  PT - Assessment/Plan  Comments on Treatment Session patient with back pain, moves impulsively.  unable to sit on BSC long enough for BM  PT Plan Discharge plan remains appropriate  PT Frequency Min 3X/week  Follow Up Recommendations Home health PT  Equipment Recommended Rolling walker with 5" wheels  Acute Rehab PT Goals  PT Goal: Rolling Supine to Right Side - Progress Progressing toward goal  PT Goal: Supine/Side to Sit - Progress Progressing toward goal  PT Goal: Sit to  Stand - Progress Progressing toward goal

## 2012-03-14 NOTE — Progress Notes (Addendum)
Workup remains in progress. He is s/p thyroid biopsy and T12 vertebral body biopsy. No pathology back yet. MRIs ordered for today. Cannot accommodate Bone marrow biopsy today. Tentatively on schedule for 1100 tomorrow but would wait on other path reports.  Brayton El PA-C 03/14/2012 8:34 AM

## 2012-03-14 NOTE — Progress Notes (Signed)
Results of MRI called to Dr. Myna Hidalgo. He will review results. Robert Berger

## 2012-03-14 NOTE — Progress Notes (Signed)
[facility] PROGRESS NOTE  Robert Berger OZH:[id number] DOB: July 28, 1941 DOA: 03/09/2012 PCP: No primary provider on file.  Assessment/Plan: Principal Problem:  *Lytic bone lesions on xray   SPEP, UPEP, immunofixation, beta 2 microglobulin, serum light chains, quantitative immunoglobulins ordered on admission, and significant for an M-spike of 2.42.   Bone scan as noted below. Bone survey ordered.  MRI of C,T,L spine ordered.  PSA panel not consistent with prostate cancer.   CXR unremarkable, CT chest negative for lung cancer.  Differential includes multiple myeloma, lymphoma (no palpable lymphadenopathy), or metastatic cancer from some other primary.  SPEP/UPEP results mildly abnormal.  LDH not elevated so lymphoma less likely.  Thyroid ultrasound shows multiple nodules, status post biopsy per IR.  Status post biopsy of L1 lytic lesion, pathology pending.  Given a dose of Pamidronate 03/13/12. 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  Thyroid ultrasound done 03/11/12, shows multiple nodules, status post biopsy.  Pathology pending. Hiatal hernia and fluid filled esophagus / GERD  Outpatient GI follow up for consideration of EGD, but may need sooner if no malignant source found with current work up.  PPI started for symptoms of GERD. 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   Continue Oxycontin for long acting pain control, continue OxyIR for breakthrough pain.  Continue K pad.  PT/OT evaluations done, home health PT and a 3:1 commode recommended. Coronary atherosclerosis  Noted incidentally on CT of chest.  Will need outpatient evaluation.  Abdominal pain   Likely from borderline hypercalcemia or constipation. Unfortunately, ionized calcium never run from original samples, and sample "too old" to run (called lab to find out what the delay  was in resulting this).  Since calcium down after hydration, not much utility to obtaining specimen now.  Placed on bowel regimen.   Hydrated.  KVO IVF. Constipation   Continue bowel regimen: Colace BID started, dulcolax PRN moderate constipation.  No BM x 3 days, will give a bottle of magnesium citrate today.  Code Status: Full Family Communication: Girlfriend updated at bedside 03/13/12. Disposition Plan: Home, when stable.   Brief narrative: Robert Berger is a 71 year old man who presented to the hospital on 03/09/12 with a 1 week history of abdominal pain and constipation.  CT scans done in the ER showed lytic lesions of the thoracic and lumbar vertebrae concerning for malignancy so he was referred for admission.  He does not have a PCP and the reason his work up is taking place in the hospital is because there is a danger his diagnosis will be delayed if pursued in the outpatient arena, especially given that he does not have a PCP to coordinate his care.  Medical Consultants:  Dr. Arlan Organ, Oncology.  Other Consultants:  Physical therapy: HHPT recommended.  Occupational therapy: No further OT.  3:1 commode recommended.  Procedures:  None.  Antibiotics:  None.  HPI/Subjective: Robert Berger is still having intermittent abdominal pain and lower back spasms.  No BM x 3 days.  Complains of nasal congestion.  No dyspnea or chest pain.  Objective: Filed Vitals:   03/13/12 1817 03/13/12 1913 03/13/12 2136 03/14/12 0544  BP: 100/55 121/59 117/73 115/74  Pulse: 65 79 71 84  Temp: 97.6 F (36.4 C) 97.5 F (36.4 C) 97.8 F (36.6 C) 98.4 F (36.9 C)  TempSrc: Oral Oral Oral Oral  Resp: 14 14 16 16   Height:  Weight:      SpO2: 90% 92% 93% 95%    Intake/Output Summary (Last 24 hours) at 03/14/12 0746 Last data filed at 03/14/12 0700  Gross per 24 hour  Intake   2432 ml  Output   1150 ml  Net   1282 ml    Exam: Gen:  NAD Cardiovascular:  RRR, No  M/R/G Respiratory: Lungs CTAB Gastrointestinal: Abdomen soft, NT/ND with normal active bowel sounds. Extremities: Trace edema  Data Reviewed: Basic Metabolic Panel:  Lab 03/10/12 1610 03/09/12 1727 03/09/12 1100  NA 132* -- 135  K 3.9 -- 3.8  CL 98 -- 102  CO2 27 -- 24  GLUCOSE 103* -- 111*  BUN 10 -- 13  CREATININE 0.80 0.88 1.10  CALCIUM 8.7 -- 10.2  MG -- -- --  PHOS -- -- --   GFR Estimated Creatinine Clearance: 103.5 ml/min (by C-G formula based on Cr of 0.8). Liver Function Tests:  Lab 03/09/12 1100  AST 38*  ALT 51  ALKPHOS 77  BILITOT 1.2  PROT 9.2*  ALBUMIN 3.9    Ref. Range 03/11/2012 11:55  LDH Latest Range: 94-250 U/L 205    Lab 03/09/12 1100  LIPASE 28  AMYLASE --   CBC:  Lab 03/13/12 0428 03/09/12 1727 03/09/12 1100  WBC 4.4 5.8 5.9  NEUTROABS -- -- 3.7  HGB 13.4 14.3 14.8  HCT 39.0 40.3 40.6  MCV 93.1 91.2 89.8  PLT 138* 133* 135*   Cardiac Enzymes:  Lab 03/09/12 1100  CKTOTAL 80  CKMB 1.9  CKMBINDEX --  TROPONINI <0.30     Ref. Range 03/09/2012 17:26  PSA Latest Range: <=4.00 ng/mL 3.89  PSA, Free No range found 0.69  PSA, Free Pct Latest Range: >25 % 18 (L)     Ref. Range 03/11/2012 11:55  TSH Latest Range: 0.350-4.500 uIU/mL 0.815     Ref. Range 03/09/2012 17:26  Beta-2 Microglobulin Latest Range: 1.01-1.73 mg/L 2.34 (H)  Kappa free light chain Latest Range: 0.33-1.94 mg/dL 9.60  Lamda free light chains Latest Range: 0.57-2.63 mg/dL 4.54 (L)  Kappa, lamda light chain ratio Latest Range: 0.26-1.65  6.15 (H)   Studies:  X-ray Chest Pa And Lateral  03/09/2012  IMPRESSION: Low lung volumes without acute disease.  Original Report Authenticated By: Consuello Bossier, M.D.    Nm Bone Scan Whole Body 03/10/2012  IMPRESSION: Increased radiotracer uptake in the T12 vertebra corresponds with the lytic lesion on recent CT, most compatible with neoplasm, likely metastatic disease or myeloma.  Original Report Authenticated By: Andreas Newport, M.D.     Ct Abdomen Pelvis W Contrast 03/09/2012 IMPRESSION: No evidence of bowel obstruction.  Normal appendix.  Colonic diverticulosis, without associated inflammatory changes.  No CT findings to account for the patient's abdominal pain.  Expansile lytic lesion within the left T12 vertebral body with suspected cortical disruption posteriorly.  Additional suspected lytic lesions and L1 and L3.  Primary differential considerations are metastases and myeloma.  Original Report Authenticated By: Charline Bills, M.D.    Ct Chest W Contrast 03/10/2012   IMPRESSION: 1.  Negative for lung cancer. 2.  Soft tissue density osteolytic lesions occupying T9 and T10 vertebral bodies, with possible small amount of extraosseous extension to the right of T10.  Although nonspecific, lymphoma, myeloma and metastatic disease of the primary considerations. Small metastatic lesion in the right sternal manubrium also present. 3.  Small hiatal hernia and fluid distended esophagus.  There may be some esophageal thickening distally.  Consider follow-up  endoscopy.  Original Report Authenticated By: Andreas Newport, M.D.    US Soft Tissue Head/neck 03/11/2012  IMPRESSION:  Multiple bilateral thyroid nodules, dominant lesion on the left. Findings meet consensus criteria for biopsy.  Ultrasound-guided fine needle aspiration should be considered, as per the consensus statement: Management of Thyroid Nodules Detected at [place]:  Society of Radiologists in Ultrasound Consensus Conference Statement. Radiology 2005; [id number].  Original Report Authenticated By: Thora Lance III, M.D.    US Thyroid Biopsy 03/13/2012 IMPRESSION: Successful ultrasound-guided fine needle aspiration of a dominant left thyroid nodule.  Original Report Authenticated By: Donavan Burnet, M.D.    Ct Biopsy 03/13/2012 IMPRESSION: Successful CT-guided core biopsy of a T12 vertebral body lesion.  Original Report Authenticated By: Donavan Burnet, M.D.    Scheduled Meds:     . docusate sodium  100 mg Oral BID  . feeding supplement  237 mL Oral BID BM  . naproxen  250 mg Oral TID WC  . oxyCODONE  15 mg Oral Q12H  . pamidronate (AREDIA) 90 mg IVPB  90 mg Intravenous Once  . pantoprazole  40 mg Oral Q1200  . vitamin A & D       Continuous Infusions:    . dextrose 5 % and 0.9% NaCl 50 mL/hr at 03/13/12 1108  . DISCONTD: sodium chloride 15 mL/hr at 03/12/12 1501    Time spent: 35 minutes.   LOS: 5 days   RAMA,CHRISTINA  [facility] Pager [phone].  If 8PM-8AM, please contact night-coverage at [email or web address], password [facility] 03/14/2012, 7:46 AM

## 2012-03-14 NOTE — Consult Note (Signed)
NAMEMarland Kitchen  JOBIN, MONTELONGO NO.:  [id number]  MEDICAL RECORD NO.:  [id number]  LOCATION:  1334                         FACILITY:  [facility]  PHYSICIAN:  [name], M.D.  DATE OF BIRTH:  09-20-1940  DATE OF CONSULTATION: DATE OF DISCHARGE:                                CONSULTATION   REASON FOR CONSULTATION: 1. Multiple lytic bone lesions. 2. Monoclonal spike.  HISTORY OF PRESENT ILLNESS:  Mr. Corcoran is a 71 year old gentleman who is still working.  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  He is on [employer] for back injury.  He is having some issues with abdominal pain and constipation.  This began to worsen on him.  He ultimately was admitted on March 09, 2012.  However, when he was admitted and worked up, he was noted to have some lytic lesions in his bones.  CT scan of the abdomen and pelvis done on admission showed no bowel obstruction. He had diverticulosis.  He had expansile lytic lesion at the left T12 vertebral body.  Also noted were some lytic lesions at L1 and L3.  When he was admitted, he had a total protein of 9.2 with an albumin of 3.9.  Calcium is 10.2.  He was not anemic.  He had some additional x-ray studies done.  CT of the chest was done. This showed enlargement in the left thyroid lobe.  He did have some calcifications in the left thyroid.  There is no mediastinal or hilar adenopathy.  He had a coronary artery atherosclerosis.  Heart looked okay.  There is no mediastinal or hilar lymphadenopathy.  Lungs looked okay.  He had a lytic lesion in the right sternal manubrium.  He had soft tissue density lesions at T9 and T10.  There may be some right- sided extraosseous extension at T10.  No destructive bone lesions were noted.  No bone survey have been ordered yet.  No MRIs have been ordered yet.  A 24-hour urine was done.  He only had 5.3 mg of protein.  There was no monoclonal Bence-Jones protein noted in his 24-hour urine.  He did  have the SPEP done.  This did show M-spike of 2.42 g/dL.  There was no quantitative immunoglobulins done.  No serum IFE was done.  He had normal free light chains.  The kappa light chain of 1.6 mg/dL and lambda free light chain of 0.26 mg/dL.  Dr. Darnelle Catalan called [place], wanted to know that he was admitted for [place] to see the patient, so that he would not get lost to followup once he was discharged.  He has had no weight loss or weight gain.  There has been no problems with recurrent infections.  He has had no rashes.  He has had no bleeding.  There has been no change in bowel or bladder habits outside the recent constipation.  PAST MEDICAL HISTORY:  Remarkable for: 1. "Arthritis." 2. GERD.  ALLERGIES:  PENICILLIN.  ADMISSION MEDICATIONS:  Naprosyn and Prilosec.  SOCIAL HISTORY:  Remarkable for past tobacco use.  He stopped in 1979. He has daily alcohol use, that is mild.  He lives with his girlfriend who is being with now for  18 years.  Again, he is an Retail banker. There is exposures to solvents and petrochemicals.  FAMILY HISTORY:  Remarkable for "cancer" in maternal grandfather and maternal grandmother.  REVIEW OF SYSTEMS:  This is pretty much unremarkable.  PHYSICAL EXAMINATION:  GENERAL:  This is a somewhat disheveled-appearing white gentleman in no obvious distress.  VITAL SIGNS:  Temperature 97.7, pulse 68, respiratory rate 15, blood pressure 124/64. HEAD AND NECK:  Shows a normocephalic, atraumatic skull.  There are no ocular or oral lesions.  No adenopathy is noted in the neck. LUNGS:  Clear bilaterally.  There may be some slight decrease at the bases. CARDIAC:  Regular rate and rhythm with a normal S1 and S2.  There are no murmurs, rubs, or bruits. ABDOMEN:  Soft, he has good bowel sounds.  There is no fluid wave. There is no palpable abdominal mass.  There is no palpable hepatosplenomegaly. EXTREMITIES:  Shows no clubbing, cyanosis, or edema. SKIN:  No rashes,  ecchymosis, or petechiae. NEUROLOGIC:  Shows no focal neurological deficits.  LABORATORY STUDIES:  White cell count 4.4, hemoglobin 13.4, hematocrit 39, platelet count 138.  TSH is 0.185.  PSA is 3.89.  BUN 10, creatinine 0.8.  LDH is 205.  IMPRESSION:  Mr. Chait is a 71 year old gentleman with lytic bone lesions.  He has a monoclonal spike.  I am not convinced that he has myeloma.  His total protein is up with his albumin okay.  We will have to do a serum IFE.  He does not have any Bence-Jones protein in his urine, which is little bit troublesome for myeloma.  The M-spike still not be reactive too and underlying malignancy.  I suspect that his spike need to have a biopsy.  I do think the bone marrow apply would not be a bad idea.  I still think this would help [place] with respect to myeloma diagnosis.  He has these lytic bone lesions.  He does need to have a bone survey done.  We will get that ordered.  I think the Zometa or Aredia would be a good idea for him.  I think this could certainly give his bone some protection.  Again, some form of tissue diagnosis is going to be necessary here.  It is hard for [place] to really make any other recommendations till we know exactly what is going on with him.  He is a real nice guy.  He is originally from [place], [place].  We certainly had a good talk about the old [place] oilers back in the 58s and 79s.  His girlfriend was with [place].  I answered her questions.  He clearly needs to be in the hospital to continue his workup.     [name], M.D.     PRE/MEDQ  D:  03/13/2012  T:  03/14/2012  Job:  [id number]

## 2012-03-14 NOTE — Progress Notes (Signed)
Workup still in progress. He might be going for a bone marrow test today. He does need to have an MRI of the spine.  No thyroid biopsy results yet.  He is eating okay. He's had no fever. There's no bleeding. Still tough for him to move around too much secondary to pain. Again, and MRI can help [place] better assess for spine involvement. We might consider kyphoplasty or vertebroplasty depending on what our MRI shows.  Is still possible that this is myeloma. He has minimally elevated kappa light chains in his urine. We will we will see what his serum immunofixation shows.  Not much else to recommend for right now. Nutrition will be key.  We will still follow along. I agree with physical therapy.  Pete E.

## 2012-03-15 ENCOUNTER — Inpatient Hospital Stay (HOSPITAL_COMMUNITY): Payer: BC Managed Care – PPO

## 2012-03-15 ENCOUNTER — Ambulatory Visit
Admit: 2012-03-15 | Discharge: 2012-03-15 | Disposition: A | Discharge status: Home / Self Care | Payer: BC Managed Care – PPO | Attending: Radiation Oncology | Admitting: Radiation Oncology

## 2012-03-15 ENCOUNTER — Encounter (HOSPITAL_COMMUNITY): Payer: Self-pay | Admitting: Radiology

## 2012-03-15 DIAGNOSIS — M549 Dorsalgia, unspecified: Secondary | ICD-10-CM

## 2012-03-15 DIAGNOSIS — E871 Hypo-osmolality and hyponatremia: Secondary | ICD-10-CM | POA: Diagnosis present

## 2012-03-15 DIAGNOSIS — K5900 Constipation, unspecified: Secondary | ICD-10-CM

## 2012-03-15 DIAGNOSIS — C7951 Secondary malignant neoplasm of bone: Secondary | ICD-10-CM

## 2012-03-15 DIAGNOSIS — C801 Malignant (primary) neoplasm, unspecified: Secondary | ICD-10-CM

## 2012-03-15 DIAGNOSIS — G959 Disease of spinal cord, unspecified: Secondary | ICD-10-CM

## 2012-03-15 DIAGNOSIS — R109 Unspecified abdominal pain: Secondary | ICD-10-CM

## 2012-03-15 LAB — BASIC METABOLIC PANEL
CO2: 27 mEq/L (ref 19–32)
Chloride: 96 mEq/L (ref 96–112)
Glucose, Bld: 150 mg/dL — ABNORMAL HIGH (ref 70–99)
Potassium: 4 mEq/L (ref 3.5–5.1)
Sodium: 129 mEq/L — ABNORMAL LOW (ref 135–145)

## 2012-03-15 LAB — CBC
Hemoglobin: 13.4 g/dL (ref 13.0–17.0)
MCH: 32.4 pg (ref 26.0–34.0)
MCV: 91.5 fL (ref 78.0–100.0)
RBC: 4.14 MIL/uL — ABNORMAL LOW (ref 4.22–5.81)
WBC: 4.3 10*3/uL (ref 4.0–10.5)

## 2012-03-15 LAB — IMMUNOFIXATION ELECTROPHORESIS: IgA: 43 mg/dL — ABNORMAL LOW (ref 68–379)

## 2012-03-15 MED ORDER — HYDROMORPHONE HCL PF 1 MG/ML IJ SOLN
1.0000 mg | INTRAMUSCULAR | Status: DC | PRN
  Administered 2012-03-15 – 2012-03-21 (×14): 1 mg via INTRAVENOUS
  Filled 2012-03-15 (×16): qty 1

## 2012-03-15 MED ORDER — SODIUM CHLORIDE 0.9 % IV SOLN
INTRAVENOUS | Status: DC
  Administered 2012-03-15 – 2012-03-19 (×6): via INTRAVENOUS
  Administered 2012-03-20 (×2): 1000 mL via INTRAVENOUS
  Administered 2012-03-21: 22:00:00 via INTRAVENOUS

## 2012-03-15 MED ORDER — DEXAMETHASONE SODIUM PHOSPHATE 4 MG/ML IJ SOLN
20.0000 mg | INTRAMUSCULAR | Status: DC
  Administered 2012-03-15: 20 mg via INTRAVENOUS
  Filled 2012-03-15 (×2): qty 5

## 2012-03-15 MED ORDER — BACLOFEN 5 MG HALF TABLET
5.0000 mg | ORAL_TABLET | Freq: Three times a day (TID) | ORAL | Status: DC
  Administered 2012-03-15 – 2012-03-24 (×28): 5 mg via ORAL
  Filled 2012-03-15 (×30): qty 1

## 2012-03-15 MED ORDER — LORAZEPAM 2 MG/ML IJ SOLN: 1.0000 mg | Freq: Once | INTRAMUSCULAR | Status: DC

## 2012-03-15 MED ORDER — GADOBENATE DIMEGLUMINE 529 MG/ML IV SOLN
20.0000 mL | Freq: Once | INTRAVENOUS | Status: AC | PRN
  Administered 2012-03-15: 20 mL via INTRAVENOUS

## 2012-03-15 NOTE — Progress Notes (Signed)
Subjective: Pt is s/p thyroid bx and T12 vert body biopsy. We are requested to do bone marrow biopsy today. Also now requested for possible ablation/augmentation of lower Thoracic vertebral deformities on MRI, particularly T9, T10, and T12.   Objective: Physical Exam: BP 141/86  Pulse 99  Temp 98.8 F (37.1 C) (Oral)  Resp 18  Ht 5\' 8"  (1.727 m)  Wt 250 lb (113.399 kg)  BMI 38.01 kg/m2  SpO2 95% Lungs: CTA without w/r/r Heart: Regular   Labs: CBC  Basename 03/15/12 0402 03/13/12 0428  WBC 4.3 4.4  HGB 13.4 13.4  HCT 37.9* 39.0  PLT 123* 138*   BMET  Basename 03/15/12 0402  NA 129*  K 4.0  CL 96  CO2 27  GLUCOSE 150*  BUN 7  CREATININE 0.83  CALCIUM 8.2*   LFT No results found for this basename: PROT,ALBUMIN,AST,ALT,ALKPHOS,BILITOT,BILIDIR,IBILI,LIPASE in the last 72 hours PT/INR  Basename 03/13/12 0428  LABPROT 12.7  INR 0.93     Studies/Results: Mr Cervical Spine Wo Contrast  03/14/2012  *RADIOLOGY REPORT*  Clinical Data:  Lytic lesions noted on CT.  Possible myeloma.  Back pain.  MRI CERVICAL AND THORACIC SPINE WITHOUT CONTRAST  Technique:  Multiplanar and multiecho pulse sequences of the cervical spine, to include the craniocervical junction and cervicothoracic junction, and the thoracic spine, were obtained without intravenous contrast.  Comparison:  03/09/2012 CT of the abdomen and pelvis.  08/02 03/28/2012 CT of the chest.  03/10/2012 bone scan.  The present examination was ordered as a cervical, thoracic and lumbar spine MR with contrast however, the patient was not able to tolerate imaging and only a motion degraded MR of the cervical and thoracic spine without contrast was not able to be obtained.  MRI CERVICAL SPINE  Findings:  Motion degraded exam.  Subtle altered signal intensity of bone marrow throughout the cervical spine raises possibility of involvement by tumor although less pronounced than seen in the thoracic region.  No obvious epidural  extension of tumor in the region of the cervical spine.  Small areas of altered signal intensity suggest tumor involvement of portions of the clivus and occipital bone.  C5-6:  Broad-based disc osteophyte complex greater to the right with mild spinal stenosis and mild cord contact.  Uncinate bony overgrowth with moderate bilateral foraminal narrowing greater on the right.  C6-7:  Bulge/osteophyte greater to the right.  Mild spinal stenosis.  Minimal cord contact.  Mild bilateral foraminal narrowing.  IMPRESSION: Motion degraded exam.  Subtle altered signal intensity of bone marrow throughout the cervical spine raises possibility of involvement by tumor although less pronounced than seen in the thoracic region.  No obvious epidural extension of tumor in the region of the cervical spine.  Small areas of altered signal intensity suggest tumor involvement of portions of the clivus and occipital bone.  Degenerative changes C5-6 and C6-7 as noted above.  MRI THORACIC SPINE  Small areas of altered signal intensity involving portions of T 3 through T8 vertebral body and left T6 facet suggestive of involvement by tumor.  Tumor involvement of a majority of the T9 and T10 vertebral body with extension into the T8-9 disc space and superior aspect of the T11 vertebral body. T9 and T10 tumor involvement extends into the posterior elements and there is mild bony expansion of the posterior aspect of the diseased vertebra greater to left midline without significant spinal stenosis or cord compression. Paraspinal extension tumor extends in the anterior lateral position bilaterally.  Destructive lesion  T12 vertebra with 15% loss of height. Epidural extension of tumor with spinal stenosis and crowding of the distal cord / conus.  Extension of tumor into the neural foramen greater at the T12-L1 level with small amount of tumor extension into the T11-12 neural foramen.  Abnormal appearance of the ribs bilaterally.  This may represent  result of anemia.  IMPRESSION: Destructive lesion T12 vertebra with epidural and foraminal extension of tumor as detailed above.  This causes spinal stenosis and crowding of the distal cord / conus.  Tumor involvement from the lower T8 to upper T11 with mild bony expansion of the  posterior aspect of the diseased T9 and T10 vertebral body but without cord compression.  Small areas of tumor involvement T3-T8.  Please see above for further detail.  This has been made a PRA call report utilizing dashboard call feature.  Original Report Authenticated By: Fuller Canada, M.D.   Mr Thoracic Spine Wo Contrast  03/14/2012  *RADIOLOGY REPORT*  Clinical Data:  Lytic lesions noted on CT.  Possible myeloma.  Back pain.  MRI CERVICAL AND THORACIC SPINE WITHOUT CONTRAST  Technique:  Multiplanar and multiecho pulse sequences of the cervical spine, to include the craniocervical junction and cervicothoracic junction, and the thoracic spine, were obtained without intravenous contrast.  Comparison:  03/09/2012 CT of the abdomen and pelvis.  08/02 03/28/2012 CT of the chest.  03/10/2012 bone scan.  The present examination was ordered as a cervical, thoracic and lumbar spine MR with contrast however, the patient was not able to tolerate imaging and only a motion degraded MR of the cervical and thoracic spine without contrast was not able to be obtained.  MRI CERVICAL SPINE  Findings:  Motion degraded exam.  Subtle altered signal intensity of bone marrow throughout the cervical spine raises possibility of involvement by tumor although less pronounced than seen in the thoracic region.  No obvious epidural extension of tumor in the region of the cervical spine.  Small areas of altered signal intensity suggest tumor involvement of portions of the clivus and occipital bone.  C5-6:  Broad-based disc osteophyte complex greater to the right with mild spinal stenosis and mild cord contact.  Uncinate bony overgrowth with moderate bilateral  foraminal narrowing greater on the right.  C6-7:  Bulge/osteophyte greater to the right.  Mild spinal stenosis.  Minimal cord contact.  Mild bilateral foraminal narrowing.  IMPRESSION: Motion degraded exam.  Subtle altered signal intensity of bone marrow throughout the cervical spine raises possibility of involvement by tumor although less pronounced than seen in the thoracic region.  No obvious epidural extension of tumor in the region of the cervical spine.  Small areas of altered signal intensity suggest tumor involvement of portions of the clivus and occipital bone.  Degenerative changes C5-6 and C6-7 as noted above.  MRI THORACIC SPINE  Small areas of altered signal intensity involving portions of T 3 through T8 vertebral body and left T6 facet suggestive of involvement by tumor.  Tumor involvement of a majority of the T9 and T10 vertebral body with extension into the T8-9 disc space and superior aspect of the T11 vertebral body. T9 and T10 tumor involvement extends into the posterior elements and there is mild bony expansion of the posterior aspect of the diseased vertebra greater to left midline without significant spinal stenosis or cord compression. Paraspinal extension tumor extends in the anterior lateral position bilaterally.  Destructive lesion T12 vertebra with 15% loss of height. Epidural extension of tumor with  spinal stenosis and crowding of the distal cord / conus.  Extension of tumor into the neural foramen greater at the T12-L1 level with small amount of tumor extension into the T11-12 neural foramen.  Abnormal appearance of the ribs bilaterally.  This may represent result of anemia.  IMPRESSION: Destructive lesion T12 vertebra with epidural and foraminal extension of tumor as detailed above.  This causes spinal stenosis and crowding of the distal cord / conus.  Tumor involvement from the lower T8 to upper T11 with mild bony expansion of the  posterior aspect of the diseased T9 and T10 vertebral  body but without cord compression.  Small areas of tumor involvement T3-T8.  Please see above for further detail.  This has been made a PRA call report utilizing dashboard call feature.  Original Report Authenticated By: Fuller Canada, M.D.   US Thyroid Biopsy  03/13/2012  *RADIOLOGY REPORT*  Clinical Data/Indication: Dominant left thyroid nodule  ULTRASOUND-GUIDED BIOPSY OF A DOMINANT LEFT THYROID NODULE.  FINE NEEDLE ASPIRATION.  Procedure: The procedure, risks, benefits, and alternatives were explained to the patient. Questions regarding the procedure were encouraged and answered. The patient understands and consents to the procedure.  The neck was prepped with betadine in a sterile fashion, and a sterile drape was applied covering the operative field. A mask and sterile gloves were used for the procedure.  Under sonographic guidance, three 25 gauge fine needle aspirates of the dominant left thyroid nodule were obtained. Final imaging was performed.  Findings: The images document guide needle placement within the abdominal left thyroid nodule. Post biopsy images demonstrate no hemorrhage.  IMPRESSION: Successful ultrasound-guided fine needle aspiration of a dominant left thyroid nodule.  Original Report Authenticated By: Donavan Burnet, M.D.   Ct Biopsy  03/13/2012  *RADIOLOGY REPORT*  Clinical Data/Indication: T12 VERTEBRAL BODY LESION  CT-GUIDED BIOPSY HAVE A T12 VERTEBRAL BODY LESION.  CORE.  Sedation: Versed 2.0 mg, Fentanyl 100 mcg.  Total Moderate Sedation Time: 30 minutes.  Procedure: The procedure, risks, benefits, and alternatives were explained to the patient. Questions regarding the procedure were encouraged and answered. The patient understands and consents to the procedure.  The low back in the prone position was prepped with betadine in a sterile fashion, and a sterile drape was applied covering the operative field. A sterile gown and sterile gloves were used for the procedure.  Under CT  guidance, the 17 gauge needle was inserted into the T12 vertebral body lesion via left paramedian approach.  Four 18 gauge core biopsies were obtained. Final imaging was performed.  Patient tolerated the procedure well without complication.  Vital sign monitoring by nursing staff during the procedure will continue as patient is in the special procedures unit for post procedure observation.  Findings: The images document guide needle placement within the T12 vertebral body lesion.  Post biopsy images demonstrate no hemorrhage.  IMPRESSION: Successful CT-guided core biopsy of a T12 vertebral body lesion.  Original Report Authenticated By: Donavan Burnet, M.D.    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 Consent signed in chart. Will review all imaging after MRI L-Spine is completed. MD to review case for poss ablation/augmentation procedure. T12 biopsy done 8/5 still pending and BM bx for  today.    LOS: 6 days    Brayton El PA-C 03/15/2012 9:16 AM

## 2012-03-15 NOTE — Progress Notes (Signed)
I have seen the MRI results. We have a significant issue in the lower thoracic spine. At T12, this might be the biggest problem. I am going to see about the possibility of vertebral plasty by interventional radiology. If not at T12, possibly T9 and T10 might be feasible. He clearly will need radiation therapy. Please get them involved. I know that they will'll not do any treatment until a diagnosis is confirmed.  He cannot finish up the lumbar MRI. We'll see about doing this today under sedation with Ativan.  He goes for a bone marrow biopsy today. Still not sure that this is myeloma. 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  It is still critical that a diagnosis be made. I suspect that if the bone marrow is negative, a vertebroplasty with biopsies of the vertebral bodies would be the next step.  A bone survey still would be helpful. This however is not mandatory right now.  I just don't see any way that he is able to be discharged. He has these significant spinal lesions. If not treated properly, he could be at risk for cord involvement.  I really do not see him being able to copack to work given his current of work and the degree of spinal involvement.  I will be out of town for 3 days at a meeting. If there is any additional issues that need our attention, please let us know. We really are in" holding pattern" until we get some type of tissue confirmation of malignancy.  Pete E.  1 Corinthians 13:13

## 2012-03-15 NOTE — Progress Notes (Signed)
OT Cancellation Note  Treatment cancelled today due to patient receiving procedure or test. Pt currently at CT for biopsy. Will check back another time.  Camerin Ladouceur A OTR/L [phone] 03/15/2012, 11:44 AM

## 2012-03-15 NOTE — ED Notes (Signed)
New procedure.  Bone marrow bx.

## 2012-03-15 NOTE — Progress Notes (Signed)
PT Cancellation Note  ___Treatment cancelled today due to medical issues with patient which prohibited physical therapy/ awaiting results of BX.  ___ Treatment cancelled today due to patient receiving procedure or test   ___ Treatment cancelled today due to patient's refusal to participate   ___ Treatment cancelled today due to    [facility] PT [phone]

## 2012-03-15 NOTE — Progress Notes (Signed)
[facility] PROGRESS NOTE  Robert Berger ZOX:[id number] DOB: 03/03/1941 DOA: 03/09/2012 PCP: No primary provider on file.  Brief narrative: 71 year old man who presented to the hospital on 03/09/12 with a 1 week history of abdominal pain and constipation. CT scans done in the ER showed lytic lesions of the thoracic and lumbar vertebrae concerning for malignancy.  He does not have a PCP to coordinate his care and he is at risk of having cord involvement due spinal lytic lesions.  Assessment/Plan:   Principal Problem:  *Lytic bone lesions on xray  SPEP, UPEP, immunofixation, beta 2 microglobulin, serum light chains, quantitative immunoglobulins ordered on admission, and significant for an M-spike of 2.42.  CT guided biopsy of T12 vertebral body done 03/13/2012 Bone survey ordered.  Awaiting MRI to be completed. Patient did have some issues with MRI but was given option of having ativan prior to going for MRI. Will follow up PSA panel not consistent with prostate cancer.  CXR non revealing CT chest negative for lung malignancy hyroid ultrasound shows multiple nodules, status post biopsy per IR Given a dose of Pamidronate 03/13/12.  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  Thyroid ultrasound was done 03/11/12, shows multiple nodules status post biopsy[- pending pathology Hiatal hernia and fluid filled esophagus / GERD  PPI started for symptoms of GERD. 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 Continue K pad.  PT/OT evaluations done - home health PT and a 3:1 commode recommended. Abdominal pain  Likely due to constipation Continue current bowel regimen Hydrated.   Code Status: Full  Family Communication: Girlfriend updated at bedside 03/13/12.  Disposition Plan: Home, when stable.   Medical  Consultants:  Dr. Arlan Organ, Oncology.  Other Consultants:  Physical therapy: HHPT recommended.  Occupational therapy: No further OT. 3:1 commode recommended.  Procedures:  None.  Antibiotics:  None.  [name], MD  [facility] Pager [phone]  If 7PM-7AM, please contact night-coverage [email or web address] Password [facility] 03/15/2012, 7:35 AM   LOS: 6 days   HPI/Subjective: No acute events overnight. Still has pain.  Objective: Filed Vitals:   03/13/12 2136 03/14/12 0544 03/14/12 2147 03/15/12 0514  BP: 117/73 115/74 115/71 141/86  Pulse: 71 84 79 99  Temp: 97.8 F (36.6 C) 98.4 F (36.9 C) 98.8 F (37.1 C) 98.8 F (37.1 C)  TempSrc: Oral Oral Oral Oral  Resp: 16 16 16 18   Height:      Weight:      SpO2: 93% 95% 94% 95%    Intake/Output Summary (Last 24 hours) at 03/15/12 0735 Last data filed at 03/15/12 0600  Gross per 24 hour  Intake    275 ml  Output   1080 ml  Net   -805 ml    Exam:   General:  Pt is alert, follows commands appropriately, not in acute distress  Cardiovascular: Regular rate and rhythm, S1/S2, no murmurs, no rubs, no gallops  Respiratory: Clear to auscultation bilaterally, no wheezing, no crackles, no rhonchi  Abdomen: Soft, non tender, non distended, bowel sounds present, no guarding  Extremities:still lot of pain and not really able to move a lot  Neuro: Grossly nonfocal  Data Reviewed: Basic Metabolic Panel:  Lab 03/15/12 8295 03/10/12 0320 03/09/12 1100  NA 129* 132* 135  K 4.0 3.9 3.8  CL 96 98 102  CO2  27 27 24   GLUCOSE 150* 103* 111*  BUN 7 10 13   CREATININE 0.83 0.80 1.10  CALCIUM 8.2* 8.7 10.2   Liver Function Tests:  Lab 03/09/12 1100  AST 38*  ALT 51  ALKPHOS 77  BILITOT 1.2  PROT 9.2*  ALBUMIN 3.9    Lab 03/09/12 1100  LIPASE 28  AMYLASE --   No results found for this basename: AMMONIA:5 in the last 168 hours CBC:  Lab 03/15/12 0402 03/13/12 0428 03/09/12 1727 03/09/12 1100  WBC  4.3 4.4 5.8 5.9  HGB 13.4 13.4 14.3 14.8  HCT 37.9* 39.0 40.3 40.6  MCV 91.5 93.1 91.2 89.8  PLT 123* 138* 133* 135*   Cardiac Enzymes:  Lab 03/09/12 1100  CKTOTAL 80  CKMB 1.9  CKMBINDEX --  TROPONINI <0.30   BNP: No components found with this basename: POCBNP:5 CBG: No results found for this basename: GLUCAP:5 in the last 168 hours  No results found for this or any previous visit (from the past 240 hour(s)).   Studies: Mr Cervical Spine Wo Contrast  03/14/2012  *RADIOLOGY REPORT*  Clinical Data:  Lytic lesions noted on CT.  Possible myeloma.  Back pain.  MRI CERVICAL AND THORACIC SPINE WITHOUT CONTRAST  Technique:  Multiplanar and multiecho pulse sequences of the cervical spine, to include the craniocervical junction and cervicothoracic junction, and the thoracic spine, were obtained without intravenous contrast.  Comparison:  03/09/2012 CT of the abdomen and pelvis.  08/02 03/28/2012 CT of the chest.  03/10/2012 bone scan.  The present examination was ordered as a cervical, thoracic and lumbar spine MR with contrast however, the patient was not able to tolerate imaging and only a motion degraded MR of the cervical and thoracic spine without contrast was not able to be obtained.  MRI CERVICAL SPINE  Findings:  Motion degraded exam.  Subtle altered signal intensity of bone marrow throughout the cervical spine raises possibility of involvement by tumor although less pronounced than seen in the thoracic region.  No obvious epidural extension of tumor in the region of the cervical spine.  Small areas of altered signal intensity suggest tumor involvement of portions of the clivus and occipital bone.  C5-6:  Broad-based disc osteophyte complex greater to the right with mild spinal stenosis and mild cord contact.  Uncinate bony overgrowth with moderate bilateral foraminal narrowing greater on the right.  C6-7:  Bulge/osteophyte greater to the right.  Mild spinal stenosis.  Minimal cord contact.   Mild bilateral foraminal narrowing.  IMPRESSION: Motion degraded exam.  Subtle altered signal intensity of bone marrow throughout the cervical spine raises possibility of involvement by tumor although less pronounced than seen in the thoracic region.  No obvious epidural extension of tumor in the region of the cervical spine.  Small areas of altered signal intensity suggest tumor involvement of portions of the clivus and occipital bone.  Degenerative changes C5-6 and C6-7 as noted above.  MRI THORACIC SPINE  Small areas of altered signal intensity involving portions of T 3 through T8 vertebral body and left T6 facet suggestive of involvement by tumor.  Tumor involvement of a majority of the T9 and T10 vertebral body with extension into the T8-9 disc space and superior aspect of the T11 vertebral body. T9 and T10 tumor involvement extends into the posterior elements and there is mild bony expansion of the posterior aspect of the diseased vertebra greater to left midline without significant spinal stenosis or cord compression. Paraspinal extension tumor extends in  the anterior lateral position bilaterally.  Destructive lesion T12 vertebra with 15% loss of height. Epidural extension of tumor with spinal stenosis and crowding of the distal cord / conus.  Extension of tumor into the neural foramen greater at the T12-L1 level with small amount of tumor extension into the T11-12 neural foramen.  Abnormal appearance of the ribs bilaterally.  This may represent result of anemia.  IMPRESSION: Destructive lesion T12 vertebra with epidural and foraminal extension of tumor as detailed above.  This causes spinal stenosis and crowding of the distal cord / conus.  Tumor involvement from the lower T8 to upper T11 with mild bony expansion of the  posterior aspect of the diseased T9 and T10 vertebral body but without cord compression.  Small areas of tumor involvement T3-T8.  Please see above for further detail.  This has been made a  PRA call report utilizing dashboard call feature.  Original Report Authenticated By: Fuller Canada, M.D.   Mr Thoracic Spine Wo Contrast  03/14/2012  *RADIOLOGY REPORT*  Clinical Data:  Lytic lesions noted on CT.  Possible myeloma.  Back pain.  MRI CERVICAL AND THORACIC SPINE WITHOUT CONTRAST  Technique:  Multiplanar and multiecho pulse sequences of the cervical spine, to include the craniocervical junction and cervicothoracic junction, and the thoracic spine, were obtained without intravenous contrast.  Comparison:  03/09/2012 CT of the abdomen and pelvis.  08/02 03/28/2012 CT of the chest.  03/10/2012 bone scan.  The present examination was ordered as a cervical, thoracic and lumbar spine MR with contrast however, the patient was not able to tolerate imaging and only a motion degraded MR of the cervical and thoracic spine without contrast was not able to be obtained.  MRI CERVICAL SPINE  Findings:  Motion degraded exam.  Subtle altered signal intensity of bone marrow throughout the cervical spine raises possibility of involvement by tumor although less pronounced than seen in the thoracic region.  No obvious epidural extension of tumor in the region of the cervical spine.  Small areas of altered signal intensity suggest tumor involvement of portions of the clivus and occipital bone.  C5-6:  Broad-based disc osteophyte complex greater to the right with mild spinal stenosis and mild cord contact.  Uncinate bony overgrowth with moderate bilateral foraminal narrowing greater on the right.  C6-7:  Bulge/osteophyte greater to the right.  Mild spinal stenosis.  Minimal cord contact.  Mild bilateral foraminal narrowing.  IMPRESSION: Motion degraded exam.  Subtle altered signal intensity of bone marrow throughout the cervical spine raises possibility of involvement by tumor although less pronounced than seen in the thoracic region.  No obvious epidural extension of tumor in the region of the cervical spine.  Small  areas of altered signal intensity suggest tumor involvement of portions of the clivus and occipital bone.  Degenerative changes C5-6 and C6-7 as noted above.  MRI THORACIC SPINE  Small areas of altered signal intensity involving portions of T 3 through T8 vertebral body and left T6 facet suggestive of involvement by tumor.  Tumor involvement of a majority of the T9 and T10 vertebral body with extension into the T8-9 disc space and superior aspect of the T11 vertebral body. T9 and T10 tumor involvement extends into the posterior elements and there is mild bony expansion of the posterior aspect of the diseased vertebra greater to left midline without significant spinal stenosis or cord compression. Paraspinal extension tumor extends in the anterior lateral position bilaterally.  Destructive lesion T12 vertebra with 15%  loss of height. Epidural extension of tumor with spinal stenosis and crowding of the distal cord / conus.  Extension of tumor into the neural foramen greater at the T12-L1 level with small amount of tumor extension into the T11-12 neural foramen.  Abnormal appearance of the ribs bilaterally.  This may represent result of anemia.  IMPRESSION: Destructive lesion T12 vertebra with epidural and foraminal extension of tumor as detailed above.  This causes spinal stenosis and crowding of the distal cord / conus.  Tumor involvement from the lower T8 to upper T11 with mild bony expansion of the  posterior aspect of the diseased T9 and T10 vertebral body but without cord compression.  Small areas of tumor involvement T3-T8.  Please see above for further detail.  This has been made a PRA call report utilizing dashboard call feature.  Original Report Authenticated By: Fuller Canada, M.D.   US Thyroid Biopsy  03/13/2012  *RADIOLOGY REPORT*  Clinical Data/Indication: Dominant left thyroid nodule  ULTRASOUND-GUIDED BIOPSY OF A DOMINANT LEFT THYROID NODULE.  FINE NEEDLE ASPIRATION.  Procedure: The procedure,  risks, benefits, and alternatives were explained to the patient. Questions regarding the procedure were encouraged and answered. The patient understands and consents to the procedure.  The neck was prepped with betadine in a sterile fashion, and a sterile drape was applied covering the operative field. A mask and sterile gloves were used for the procedure.  Under sonographic guidance, three 25 gauge fine needle aspirates of the dominant left thyroid nodule were obtained. Final imaging was performed.  Findings: The images document guide needle placement within the abdominal left thyroid nodule. Post biopsy images demonstrate no hemorrhage.  IMPRESSION: Successful ultrasound-guided fine needle aspiration of a dominant left thyroid nodule.  Original Report Authenticated By: Donavan Burnet, M.D.   Ct Biopsy  03/13/2012  *RADIOLOGY REPORT*  Clinical Data/Indication: T12 VERTEBRAL BODY LESION  CT-GUIDED BIOPSY HAVE A T12 VERTEBRAL BODY LESION.  CORE.  Sedation: Versed 2.0 mg, Fentanyl 100 mcg.  Total Moderate Sedation Time: 30 minutes.  Procedure: The procedure, risks, benefits, and alternatives were explained to the patient. Questions regarding the procedure were encouraged and answered. The patient understands and consents to the procedure.  The low back in the prone position was prepped with betadine in a sterile fashion, and a sterile drape was applied covering the operative field. A sterile gown and sterile gloves were used for the procedure.  Under CT guidance, the 17 gauge needle was inserted into the T12 vertebral body lesion via left paramedian approach.  Four 18 gauge core biopsies were obtained. Final imaging was performed.  Patient tolerated the procedure well without complication.  Vital sign monitoring by nursing staff during the procedure will continue as patient is in the special procedures unit for post procedure observation.  Findings: The images document guide needle placement within the T12 vertebral  body lesion.  Post biopsy images demonstrate no hemorrhage.  IMPRESSION: Successful CT-guided core biopsy of a T12 vertebral body lesion.  Original Report Authenticated By: Donavan Burnet, M.D.    Scheduled Meds:   . docusate sodium  100 mg Oral BID  . feeding supplement  237 mL Oral BID BM  . LORazepam  1 mg Intravenous Once  . magnesium citrate  1 Bottle Oral Once  . naproxen  250 mg Oral TID WC  . oxyCODONE  15 mg Oral Q12H  . pantoprazole  40 mg Oral Q1200   Continuous Infusions:   . dextrose 5 % and  0.9% NaCl 50 mL/hr at 03/14/12 1557

## 2012-03-15 NOTE — Progress Notes (Signed)
Review of imaging by Dr. Corliss Skains. Pt is a candidate for co-ablation procedure of levels T9-T12. He wanted to set up for 8/8 am with anesthesia. Pathology from T12 biopsy is c/w myeloma. Bone marrow biopsy done today-pending I did give results to pt as I needed to discuss the procedure with him.  He is very overwhelmed and actually still a bit groggy from prior sedation procedures today. His wife is not here at present. He states he only had short conversation with Dr. Myna Hidalgo regarding possible diagnosis and subsequent treatments without too much detail, as final pathology was not back as of earlier today. RadOnc consultation pending also.  At this time, he would prefer to wait on any procedure until he has a better understanding of his diagnosis and all possible treatments, expectations, and care involved. He is also concerned about the cost of all this care. We will defer treatment until pt and wife can have a 'clear concise' treatment plan. We will follow very closely as co-ablation procedure can still be offered.  Robert El PA-C 03/15/2012 4:47 PM

## 2012-03-15 NOTE — Procedures (Signed)
Technically successful CT guided bone marrow aspiration and biopsy of left iliac crest. No immediate complications. Awaiting pathology report.    

## 2012-03-15 NOTE — Progress Notes (Addendum)
Radiation Oncology         [phone]) [phone] ________________________________  Initial inpatient Consultation  Name: Robert Berger MRN: [id number]  Date: 03/09/2012  DOB: 1940/10/01  REFERRING MD: Arlan Organ  DIAGNOSIS: Multiple myeloma  HISTORY OF PRESENT ILLNESS::Robert Berger is a 71 y.o. male who has a history of back pain and sciatica pain related to back injuries; however, the patient developed new pain in the low back weeks ago. It should be noted that he is fairly somnolent today due to pain medication/anesthesia and could only provide partial history.   The patient has multiple bone lesions which according to a recently released pathology report are due to multiple myeloma. The most worrisome lesion is at T12. This area was biopsied and the report revealed plasmacytoma/ plasma cell myeloma.  I reviewed the patient's full spine MRIs. On MRI, He has multilevel disease suspicious for tumor from the top of the C-spine through the sacrum. He has disease in the ilium as well. At the level of T12, there is diffuse tumor involvement with epidural extension of tumor with crowding of the distal cord/conus. He is receiving Decadron, IV.  Bone survey today reveals multiple lesions consistent with multiple myeloma.  Chest CT on 03/10/2012 was negative for lung cancer.  He is being considered for vertebroplasty. Results from a  bone marrow biopsy are pending. A thyroid biopsy was performed on  03/13/2012 which demonstrated nonneoplastic goiter.  The patient reports that he has not been able to walk due to the pain in his back; he denies any weakness in his extremities. He denies numbness in his extremities other than transient numbness in his left hand during an MRI. He denies any incontinence. He does report constipation, flatulence, and urinary symptoms (frequency, urgency) related to a chronic condition of BPH.   PREVIOUS RADIATION THERAPY: No  PAST MEDICAL HISTORY:  has a past medical  history of Arthritis; GERD (gastroesophageal reflux disease); and Complication of anesthesia.    PAST SURGICAL HISTORY: Past Surgical History  Procedure Date  . Fracture surgery     ankle  . Hemorrhoid surgery   . Back surgery     47 years ago    FAMILY HISTORY: family history includes Cancer in his maternal grandfather and maternal grandmother.  SOCIAL HISTORY:  reports that he quit smoking about 34 years ago. His smoking use included Cigarettes. He has never used smokeless tobacco. He reports that he drinks alcohol. He reports that he does not use illicit drugs.  ALLERGIES: Penicillins  MEDICATIONS:  Current Facility-Administered Medications  Medication Dose Route Frequency Provider Last Rate Last Dose  . 0.9 %  sodium chloride infusion   Intravenous Continuous [name], MD 50 mL/hr at 03/15/12 1011    . acetaminophen (TYLENOL) tablet 650 mg  650 mg Oral Q6H PRN Maryruth Bun Rama, MD       Or  . acetaminophen (TYLENOL) suppository 650 mg  650 mg Rectal Q6H PRN Maryruth Bun Rama, MD      . alum & mag hydroxide-simeth (MAALOX/MYLANTA) 200-200-20 MG/5ML suspension 30 mL  30 mL Oral Q4H PRN Maryruth Bun Rama, MD   30 mL at 03/14/12 2325  . baclofen (LIORESAL) tablet 5 mg  5 mg Oral TID Alison Murray, MD   5 mg at 03/15/12 1539  . bisacodyl (DULCOLAX) EC tablet 5 mg  5 mg Oral Daily PRN Maryruth Bun Rama, MD      . dexamethasone (DECADRON) injection 20 mg  20 mg Intravenous Q24H  [name], MD      . docusate sodium (COLACE) capsule 100 mg  100 mg Oral BID Maryruth Bun Rama, MD   100 mg at 03/15/12 1004  . feeding supplement (ENSURE COMPLETE) liquid 237 mL  237 mL Oral BID BM Lavena Bullion, RD   237 mL at 03/15/12 1329  . gadobenate dimeglumine (MULTIHANCE) injection 20 mL  20 mL Intravenous Once PRN Medication Radiologist, MD   20 mL at 03/15/12 1253  . HYDROmorphone (DILAUDID) injection 1 mg  1 mg Intravenous Q4H PRN [name], MD   1 mg at 03/15/12 1003  . hyoscyamine  (LEVSIN SL) SL tablet 0.125 mg  0.125 mg Sublingual Q4H PRN Maryruth Bun Rama, MD   0.125 mg at 03/10/12 1445  . LORazepam (ATIVAN) injection 1 mg  1 mg Intravenous Once [name], MD      . methocarbamol (ROBAXIN) tablet 500 mg  500 mg Oral Q6H PRN Maryruth Bun Rama, MD   500 mg at 03/15/12 0128  . ondansetron (ZOFRAN) tablet 4 mg  4 mg Oral Q6H PRN Maryruth Bun Rama, MD       Or  . ondansetron (ZOFRAN) injection 4 mg  4 mg Intravenous Q6H PRN Maryruth Bun Rama, MD   4 mg at 03/12/12 0122  . oxyCODONE (Oxy IR/ROXICODONE) immediate release tablet 5 mg  5 mg Oral Q4H PRN Maryruth Bun Rama, MD   5 mg at 03/15/12 0128  . oxyCODONE (OXYCONTIN) 12 hr tablet 15 mg  15 mg Oral Q12H Maryruth Bun Rama, MD   15 mg at 03/15/12 1005  . pantoprazole (PROTONIX) EC tablet 40 mg  40 mg Oral Q1200 Maryruth Bun Rama, MD   40 mg at 03/15/12 1330  . sodium chloride (OCEAN) 0.65 % nasal spray 1 spray  1 spray Each Nare PRN Maryruth Bun Rama, MD      . zolpidem (AMBIEN) tablet 5 mg  5 mg Oral QHS PRN Rolan Lipa, NP   5 mg at 03/13/12 2139  . DISCONTD: dextrose 5 %-0.9 % sodium chloride infusion   Intravenous Continuous Maryruth Bun Rama, MD 50 mL/hr at 03/14/12 1557    . DISCONTD: naproxen (NAPROSYN) tablet 250 mg  250 mg Oral TID WC Maryruth Bun Rama, MD   250 mg at 03/15/12 0840    REVIEW OF SYSTEMS:  As above   PHYSICAL EXAM:  height is 5\' 8"  (1.727 m) and weight is 250 lb (113.399 kg). His oral temperature is 98.1 F (36.7 C). His blood pressure is 112/67 and his pulse is 90. His respiration is 16 and oxygen saturation is 93%.   He is somnolent. Heart is regular in rate and rhythm. Chest is clear to auscultation bilaterally with minimal respiratory effort.  He has tenderness to palpation throughout the lumbar spine. No focal weakness in his legs. He denies numbness in his extremities. No lymphedema in his extremities.   LABORATORY DATA:  Lab Results  Component Value Date   WBC 4.3 03/15/2012   HGB  13.4 03/15/2012   HCT 37.9* 03/15/2012   MCV 91.5 03/15/2012   PLT 123* 03/15/2012   CMP     Component Value Date/Time   NA 129* 03/15/2012 0402   K 4.0 03/15/2012 0402   CL 96 03/15/2012 0402   CO2 27 03/15/2012 0402   GLUCOSE 150* 03/15/2012 0402   BUN 7 03/15/2012 0402   CREATININE 0.83 03/15/2012 0402   CALCIUM 8.2* 03/15/2012 0402  PROT 9.2* 03/09/2012 1100   ALBUMIN 3.9 03/09/2012 1100   AST 38* 03/09/2012 1100   ALT 51 03/09/2012 1100   ALKPHOS 77 03/09/2012 1100   BILITOT 1.2 03/09/2012 1100   GFRNONAA 86* 03/15/2012 0402   GFRAA >90 03/15/2012 0402      RADIOGRAPHY: X-ray Chest Pa And Lateral   03/09/2012  *RADIOLOGY REPORT*  Clinical Data: No chest complaints.  Osseous lytic lesions on CT of the abdomen.  CHEST - 2 VIEW  Comparison: Abdominal pelvic CT of earlier today.  Findings: Mild osteopenia.  Maintenance of thoracic vertebral body height.  Suspect ankylosing spondylitis or diffuse idiopathic skeletal hyperostosis involving the thoracic spine.  Low-volume AP frontal view.  Patient minimally rotated to the right. Normal heart size.  Normal mediastinal contours. No pleural effusion or pneumothorax.  Mildly low lung volumes. Clear lungs.  No other focal osseous lytic lesions are identified.  IMPRESSION: Low lung volumes without acute disease.  Original Report Authenticated By: Consuello Bossier, M.D.   Ct Chest W Contrast  03/10/2012  *RADIOLOGY REPORT*  Clinical Data: Lung cancer.  Lytic bone lesion.  Evaluate for primary.  CT CHEST WITH CONTRAST  Technique:  Multidetector CT imaging of the chest was performed following the standard protocol during bolus administration of intravenous contrast.  Contrast: 80mL OMNIPAQUE IOHEXOL 300 MG/ML  SOLN  Comparison: CT abdomen and bone scan.  Findings: Enlargement of the left thyroid lobe is present, incompletely visualized.  This probably represents thyroid goiter. Coarse calcifications are present in the enlarged left thyroid lobe.  There is no axillary adenopathy.  No  mediastinal or hilar adenopathy. Coronary artery atherosclerosis is present. If office based assessment of coronary risk factors has not been performed, it is now recommended.  Heart grossly appears within normal limits otherwise.  No pericardial effusion.  No pleural effusion.  Dependent atelectasis in the left lung.  Medial right upper lobe atelectasis or scarring with collapse along the right vertebral column.  Incidental imaging of the abdomen appears similar to the recent prior.  Fluid distends the esophagus.  Small hiatal hernia. Small calcified granuloma is present in the lateral right lower lobe (image 36 series 5).  There is a lytic lesion in the right sternal manubrium (image 11 series 5) measuring 15 mm.  Soft tissue density lesions also occupy the T9 and T10 vertebral bodies, best seen on sagittal imaging. There may be some right-sided extraosseous extension at T10.  No definite soft tissue mass in the central canal.  No destructive rib lesion is identified on the right to account for focus of increased radiotracer uptake and this may be secondary to degenerative disease.  Thoracic spine DISH.  IMPRESSION: 1.  Negative for lung cancer. 2.  Soft tissue density osteolytic lesions occupying T9 and T10 vertebral bodies, with possible small amount of extraosseous extension to the right of T10.  Although nonspecific, lymphoma, myeloma and metastatic disease of the primary considerations. Small metastatic lesion in the right sternal manubrium also present. 3.  Small hiatal hernia and fluid distended esophagus.  There may be some esophageal thickening distally.  Consider follow-up endoscopy.  Original Report Authenticated By: Andreas Newport, M.D.   Mr Cervical Spine Wo Contrast  03/14/2012  *RADIOLOGY REPORT*  Clinical Data:  Lytic lesions noted on CT.  Possible myeloma.  Back pain.  MRI CERVICAL AND THORACIC SPINE WITHOUT CONTRAST  Technique:  Multiplanar and multiecho pulse sequences of the cervical spine,  to include the craniocervical junction and cervicothoracic junction, and  the thoracic spine, were obtained without intravenous contrast.  Comparison:  03/09/2012 CT of the abdomen and pelvis.  08/02 03/28/2012 CT of the chest.  03/10/2012 bone scan.  The present examination was ordered as a cervical, thoracic and lumbar spine MR with contrast however, the patient was not able to tolerate imaging and only a motion degraded MR of the cervical and thoracic spine without contrast was not able to be obtained.  MRI CERVICAL SPINE  Findings:  Motion degraded exam.  Subtle altered signal intensity of bone marrow throughout the cervical spine raises possibility of involvement by tumor although less pronounced than seen in the thoracic region.  No obvious epidural extension of tumor in the region of the cervical spine.  Small areas of altered signal intensity suggest tumor involvement of portions of the clivus and occipital bone.  C5-6:  Broad-based disc osteophyte complex greater to the right with mild spinal stenosis and mild cord contact.  Uncinate bony overgrowth with moderate bilateral foraminal narrowing greater on the right.  C6-7:  Bulge/osteophyte greater to the right.  Mild spinal stenosis.  Minimal cord contact.  Mild bilateral foraminal narrowing.  IMPRESSION: Motion degraded exam.  Subtle altered signal intensity of bone marrow throughout the cervical spine raises possibility of involvement by tumor although less pronounced than seen in the thoracic region.  No obvious epidural extension of tumor in the region of the cervical spine.  Small areas of altered signal intensity suggest tumor involvement of portions of the clivus and occipital bone.  Degenerative changes C5-6 and C6-7 as noted above.  MRI THORACIC SPINE  Small areas of altered signal intensity involving portions of T 3 through T8 vertebral body and left T6 facet suggestive of involvement by tumor.  Tumor involvement of a majority of the T9 and T10  vertebral body with extension into the T8-9 disc space and superior aspect of the T11 vertebral body. T9 and T10 tumor involvement extends into the posterior elements and there is mild bony expansion of the posterior aspect of the diseased vertebra greater to left midline without significant spinal stenosis or cord compression. Paraspinal extension tumor extends in the anterior lateral position bilaterally.  Destructive lesion T12 vertebra with 15% loss of height. Epidural extension of tumor with spinal stenosis and crowding of the distal cord / conus.  Extension of tumor into the neural foramen greater at the T12-L1 level with small amount of tumor extension into the T11-12 neural foramen.  Abnormal appearance of the ribs bilaterally.  This may represent result of anemia.  IMPRESSION: Destructive lesion T12 vertebra with epidural and foraminal extension of tumor as detailed above.  This causes spinal stenosis and crowding of the distal cord / conus.  Tumor involvement from the lower T8 to upper T11 with mild bony expansion of the  posterior aspect of the diseased T9 and T10 vertebral body but without cord compression.  Small areas of tumor involvement T3-T8.  Please see above for further detail.  This has been made a PRA call report utilizing dashboard call feature.  Original Report Authenticated By: Fuller Canada, M.D.   Mr Cervical Spine W Contrast  03/15/2012  *RADIOLOGY REPORT*  Clinical Data:  Back pain.  Possible myeloma.  MRI CERVICAL AND THORACIC SPINE WITH CONTRAST  Technique:  Multiplanar and multiecho pulse sequences of the cervical spine, to include the craniocervical junction and cervicothoracic junction, and the thoracic spine, were obtained with contrast.  Contrast: 20mL MULTIHANCE GADOBENATE DIMEGLUMINE 529 MG/ML IV SOLN  Comparison:  Precontrast MR of the cervical and thoracic spine performed 03/14/2012.  MRI CERVICAL SPINE  Findings:  Motion degraded exam.  Abnormal areas of enhancement  consistent with involvement by tumor involving portions of the clivus, occipital condyles and all of the cervical vertebra.  Most prominent involvement is that involving the C2, C3 and C5 vertebral body as well as the right C5 facet and left C7 facet.  No obvious epidural extension of tumor noted on this motion degraded examination nor is there evidence of foraminal extension.  Regions of altered signal intensity involving portions of the cervical cord at the C3 and C5-6 level probably related to motion artifact rather than  involvement by tumor.  Enlarged left thyroid gland.  Tumor not excluded.  Ultrasound can be obtained for further delineation.  IMPRESSION: Motion degraded exam.  Abnormal areas of enhancement consistent with involvement by tumor involving portions of the clivus, occipital condyles and all of the cervical vertebra.  Most prominent involvement is that involving the C2, C3 and C5 vertebral body as well as the right C5 facet and left C7 facet.  No obvious epidural extension of tumor noted on this motion degraded examination nor is there evidence of foraminal extension.  Enlarged left thyroid gland.  Tumor not excluded.  Ultrasound can be obtained for further delineation.  MRI THORACIC SPINE  Findings: Only a significantly motion degraded sagittal post contrast examination of the thoracic spine was able to be obtained secondary to patient discomfort. Tumor involvement of T12 vertebra is better delineated on the lumbar spine MR.  Diffuse tumor involvement extends from the inferior T8 plate through the upper T11 end plate involving a majority of the T9 and T10 vertebral body extending into the posterior elements with mild expansion of the diseased vertebra contributing to the mild spinal stenosis.  Epidural extension of tumor/engorged veins greater on the right extending from T9 to below T12 (most notable epidural tumor at the T12 level).  Diffuse tumor involvement of the T12 vertebral body involving  posterior elements with loss of height centrally inferior aspect. Epidural extension of tumor with crowding of the distal cord / conus.  Foraminal extension of epidural disease more notable T12-L1 then at the T11-T12 level with encroachment upon the exiting T12 nerve roots.  Small areas of tumor involvement T3 through T8 vertebra including T3 and T4 spinous process, left T6 facet and left T8 facet.  IMPRESSION: Only a significantly motion degraded sagittal post contrast examination of the thoracic spine was able to be obtained.  Diffuse tumor involvement extends from the inferior T8 plate through the upper T11 end plate involving a majority of the T9 and T10 vertebral body extending into the posterior elements with mild expansion of the diseased vertebra contributing to the mild spinal stenosis.  Epidural extension of tumor/engorged veins greater on the right extending from T9 to below T12 (most notable epidural tumor at the T12 level).  Diffuse tumor involvement of the T12 vertebral body involving posterior elements with loss of height centrally inferior aspect. Epidural extension of tumor with crowding of the distal cord / conus.  Foraminal extension of epidural disease more notable T12-L1 then at the T11-T12 level with encroachment upon the exiting T12 nerve roots.  Small areas of tumor involvement T3 through T8 vertebra including T3 and T4 spinous process, left T6 facet and left T8 facet.  MRI LUMBAR SPINE WITHOUT AND WITH CONTRAST  Technique:  Multiplanar and multiecho pulse sequences of the lumbar spine were obtained without and with  intravenous contrast.  Contrast: 20mL MULTIHANCE GADOBENATE DIMEGLUMINE 529 MG/ML IV SOLN  Comparison:  CT abdomen and pelvis 03/09/2012.  Findings:  Tumor involvement of all of the visualized vertebra from L1 through the lowest aspect of the sacrum imaged (S3). Tumor involvement of the ilium bilaterally. Most notable tumor involvement L3 vertebral body and right aspect of the  sacral ala.  No epidural extension of tumor in the lumbar-sacral spine.  Conus L1 level.  L1-2:  Right posterior lateral/paracentral protrusion.  Slight crowding of the exiting right L1 nerve root and mild impression upon the right ventral aspect of the thecal sac.  L2-3:  Broad-based disc osteophyte complex greater right lateral position with slight impression upon the exiting right L2 nerve root.  Facet joint degenerative changes.  L3-4:  Baseline broad-based disc osteophyte complex.  Lateral extension touches but does not compress the exiting L3 nerve roots. Small central disc protrusion with minimal indentation upon the thecal sac.  Mild facet joint degenerative changes.  L4-5:  Small to moderate sized broad-based disc protrusion greater to the left.  Left greater than right lateral recess stenosis with mild spinal stenosis.  L5-S1:  Minimal bulge.  IMPRESSION: Tumor involvement of all of the visualized vertebra from L1 through the lowest aspect of the sacrum imaged (S3). Tumor involvement of the ilium bilaterally. Most notable tumor involvement L3 vertebral body and right aspect of the sacral ala.  No epidural extension of tumor in the lumbar-sacral spine.  Lumbar spine degenerative changes as detailed above.  Original Report Authenticated By: Fuller Canada, M.D.   Mr Thoracic Spine Wo Contrast  03/14/2012  *RADIOLOGY REPORT*  Clinical Data:  Lytic lesions noted on CT.  Possible myeloma.  Back pain.  MRI CERVICAL AND THORACIC SPINE WITHOUT CONTRAST  Technique:  Multiplanar and multiecho pulse sequences of the cervical spine, to include the craniocervical junction and cervicothoracic junction, and the thoracic spine, were obtained without intravenous contrast.  Comparison:  03/09/2012 CT of the abdomen and pelvis.  08/02 03/28/2012 CT of the chest.  03/10/2012 bone scan.  The present examination was ordered as a cervical, thoracic and lumbar spine MR with contrast however, the patient was not able to  tolerate imaging and only a motion degraded MR of the cervical and thoracic spine without contrast was not able to be obtained.  MRI CERVICAL SPINE  Findings:  Motion degraded exam.  Subtle altered signal intensity of bone marrow throughout the cervical spine raises possibility of involvement by tumor although less pronounced than seen in the thoracic region.  No obvious epidural extension of tumor in the region of the cervical spine.  Small areas of altered signal intensity suggest tumor involvement of portions of the clivus and occipital bone.  C5-6:  Broad-based disc osteophyte complex greater to the right with mild spinal stenosis and mild cord contact.  Uncinate bony overgrowth with moderate bilateral foraminal narrowing greater on the right.  C6-7:  Bulge/osteophyte greater to the right.  Mild spinal stenosis.  Minimal cord contact.  Mild bilateral foraminal narrowing.  IMPRESSION: Motion degraded exam.  Subtle altered signal intensity of bone marrow throughout the cervical spine raises possibility of involvement by tumor although less pronounced than seen in the thoracic region.  No obvious epidural extension of tumor in the region of the cervical spine.  Small areas of altered signal intensity suggest tumor involvement of portions of the clivus and occipital bone.  Degenerative changes C5-6 and C6-7 as noted above.  MRI THORACIC SPINE  Small areas of altered signal intensity involving portions of T 3 through T8 vertebral body and left T6 facet suggestive of involvement by tumor.  Tumor involvement of a majority of the T9 and T10 vertebral body with extension into the T8-9 disc space and superior aspect of the T11 vertebral body. T9 and T10 tumor involvement extends into the posterior elements and there is mild bony expansion of the posterior aspect of the diseased vertebra greater to left midline without significant spinal stenosis or cord compression. Paraspinal extension tumor extends in the anterior  lateral position bilaterally.  Destructive lesion T12 vertebra with 15% loss of height. Epidural extension of tumor with spinal stenosis and crowding of the distal cord / conus.  Extension of tumor into the neural foramen greater at the T12-L1 level with small amount of tumor extension into the T11-12 neural foramen.  Abnormal appearance of the ribs bilaterally.  This may represent result of anemia.  IMPRESSION: Destructive lesion T12 vertebra with epidural and foraminal extension of tumor as detailed above.  This causes spinal stenosis and crowding of the distal cord / conus.  Tumor involvement from the lower T8 to upper T11 with mild bony expansion of the  posterior aspect of the diseased T9 and T10 vertebral body but without cord compression.  Small areas of tumor involvement T3-T8.  Please see above for further detail.  This has been made a PRA call report utilizing dashboard call feature.  Original Report Authenticated By: Fuller Canada, M.D.   Mr Thoracic Spine W Contrast  03/15/2012  *RADIOLOGY REPORT*  Clinical Data:  Back pain.  Possible myeloma.  MRI CERVICAL AND THORACIC SPINE WITH CONTRAST  Technique:  Multiplanar and multiecho pulse sequences of the cervical spine, to include the craniocervical junction and cervicothoracic junction, and the thoracic spine, were obtained with contrast.  Contrast: 20mL MULTIHANCE GADOBENATE DIMEGLUMINE 529 MG/ML IV SOLN  Comparison:  Precontrast MR of the cervical and thoracic spine performed 03/14/2012.  MRI CERVICAL SPINE  Findings:  Motion degraded exam.  Abnormal areas of enhancement consistent with involvement by tumor involving portions of the clivus, occipital condyles and all of the cervical vertebra.  Most prominent involvement is that involving the C2, C3 and C5 vertebral body as well as the right C5 facet and left C7 facet.  No obvious epidural extension of tumor noted on this motion degraded examination nor is there evidence of foraminal extension.   Regions of altered signal intensity involving portions of the cervical cord at the C3 and C5-6 level probably related to motion artifact rather than  involvement by tumor.  Enlarged left thyroid gland.  Tumor not excluded.  Ultrasound can be obtained for further delineation.  IMPRESSION: Motion degraded exam.  Abnormal areas of enhancement consistent with involvement by tumor involving portions of the clivus, occipital condyles and all of the cervical vertebra.  Most prominent involvement is that involving the C2, C3 and C5 vertebral body as well as the right C5 facet and left C7 facet.  No obvious epidural extension of tumor noted on this motion degraded examination nor is there evidence of foraminal extension.  Enlarged left thyroid gland.  Tumor not excluded.  Ultrasound can be obtained for further delineation.  MRI THORACIC SPINE  Findings: Only a significantly motion degraded sagittal post contrast examination of the thoracic spine was able to be obtained secondary to patient discomfort. Tumor involvement of T12 vertebra is better delineated on the lumbar spine MR.  Diffuse tumor involvement extends from the  inferior T8 plate through the upper T11 end plate involving a majority of the T9 and T10 vertebral body extending into the posterior elements with mild expansion of the diseased vertebra contributing to the mild spinal stenosis.  Epidural extension of tumor/engorged veins greater on the right extending from T9 to below T12 (most notable epidural tumor at the T12 level).  Diffuse tumor involvement of the T12 vertebral body involving posterior elements with loss of height centrally inferior aspect. Epidural extension of tumor with crowding of the distal cord / conus.  Foraminal extension of epidural disease more notable T12-L1 then at the T11-T12 level with encroachment upon the exiting T12 nerve roots.  Small areas of tumor involvement T3 through T8 vertebra including T3 and T4 spinous process, left T6 facet  and left T8 facet.  IMPRESSION: Only a significantly motion degraded sagittal post contrast examination of the thoracic spine was able to be obtained.  Diffuse tumor involvement extends from the inferior T8 plate through the upper T11 end plate involving a majority of the T9 and T10 vertebral body extending into the posterior elements with mild expansion of the diseased vertebra contributing to the mild spinal stenosis.  Epidural extension of tumor/engorged veins greater on the right extending from T9 to below T12 (most notable epidural tumor at the T12 level).  Diffuse tumor involvement of the T12 vertebral body involving posterior elements with loss of height centrally inferior aspect. Epidural extension of tumor with crowding of the distal cord / conus.  Foraminal extension of epidural disease more notable T12-L1 then at the T11-T12 level with encroachment upon the exiting T12 nerve roots.  Small areas of tumor involvement T3 through T8 vertebra including T3 and T4 spinous process, left T6 facet and left T8 facet.  MRI LUMBAR SPINE WITHOUT AND WITH CONTRAST  Technique:  Multiplanar and multiecho pulse sequences of the lumbar spine were obtained without and with intravenous contrast.  Contrast: 20mL MULTIHANCE GADOBENATE DIMEGLUMINE 529 MG/ML IV SOLN  Comparison:  CT abdomen and pelvis 03/09/2012.  Findings:  Tumor involvement of all of the visualized vertebra from L1 through the lowest aspect of the sacrum imaged (S3). Tumor involvement of the ilium bilaterally. Most notable tumor involvement L3 vertebral body and right aspect of the sacral ala.  No epidural extension of tumor in the lumbar-sacral spine.  Conus L1 level.  L1-2:  Right posterior lateral/paracentral protrusion.  Slight crowding of the exiting right L1 nerve root and mild impression upon the right ventral aspect of the thecal sac.  L2-3:  Broad-based disc osteophyte complex greater right lateral position with slight impression upon the exiting right  L2 nerve root.  Facet joint degenerative changes.  L3-4:  Baseline broad-based disc osteophyte complex.  Lateral extension touches but does not compress the exiting L3 nerve roots. Small central disc protrusion with minimal indentation upon the thecal sac.  Mild facet joint degenerative changes.  L4-5:  Small to moderate sized broad-based disc protrusion greater to the left.  Left greater than right lateral recess stenosis with mild spinal stenosis.  L5-S1:  Minimal bulge.  IMPRESSION: Tumor involvement of all of the visualized vertebra from L1 through the lowest aspect of the sacrum imaged (S3). Tumor involvement of the ilium bilaterally. Most notable tumor involvement L3 vertebral body and right aspect of the sacral ala.  No epidural extension of tumor in the lumbar-sacral spine.  Lumbar spine degenerative changes as detailed above.  Original Report Authenticated By: Fuller Canada, M.D.   Mr Lumbar  Spine W Wo Contrast  03/15/2012  *RADIOLOGY REPORT*  Clinical Data:  Back pain.  Possible myeloma.  MRI CERVICAL AND THORACIC SPINE WITH CONTRAST  Technique:  Multiplanar and multiecho pulse sequences of the cervical spine, to include the craniocervical junction and cervicothoracic junction, and the thoracic spine, were obtained with contrast.  Contrast: 20mL MULTIHANCE GADOBENATE DIMEGLUMINE 529 MG/ML IV SOLN  Comparison:  Precontrast MR of the cervical and thoracic spine performed 03/14/2012.  MRI CERVICAL SPINE  Findings:  Motion degraded exam.  Abnormal areas of enhancement consistent with involvement by tumor involving portions of the clivus, occipital condyles and all of the cervical vertebra.  Most prominent involvement is that involving the C2, C3 and C5 vertebral body as well as the right C5 facet and left C7 facet.  No obvious epidural extension of tumor noted on this motion degraded examination nor is there evidence of foraminal extension.  Regions of altered signal intensity involving portions of the  cervical cord at the C3 and C5-6 level probably related to motion artifact rather than  involvement by tumor.  Enlarged left thyroid gland.  Tumor not excluded.  Ultrasound can be obtained for further delineation.  IMPRESSION: Motion degraded exam.  Abnormal areas of enhancement consistent with involvement by tumor involving portions of the clivus, occipital condyles and all of the cervical vertebra.  Most prominent involvement is that involving the C2, C3 and C5 vertebral body as well as the right C5 facet and left C7 facet.  No obvious epidural extension of tumor noted on this motion degraded examination nor is there evidence of foraminal extension.  Enlarged left thyroid gland.  Tumor not excluded.  Ultrasound can be obtained for further delineation.  MRI THORACIC SPINE  Findings: Only a significantly motion degraded sagittal post contrast examination of the thoracic spine was able to be obtained secondary to patient discomfort. Tumor involvement of T12 vertebra is better delineated on the lumbar spine MR.  Diffuse tumor involvement extends from the inferior T8 plate through the upper T11 end plate involving a majority of the T9 and T10 vertebral body extending into the posterior elements with mild expansion of the diseased vertebra contributing to the mild spinal stenosis.  Epidural extension of tumor/engorged veins greater on the right extending from T9 to below T12 (most notable epidural tumor at the T12 level).  Diffuse tumor involvement of the T12 vertebral body involving posterior elements with loss of height centrally inferior aspect. Epidural extension of tumor with crowding of the distal cord / conus.  Foraminal extension of epidural disease more notable T12-L1 then at the T11-T12 level with encroachment upon the exiting T12 nerve roots.  Small areas of tumor involvement T3 through T8 vertebra including T3 and T4 spinous process, left T6 facet and left T8 facet.  IMPRESSION: Only a significantly motion  degraded sagittal post contrast examination of the thoracic spine was able to be obtained.  Diffuse tumor involvement extends from the inferior T8 plate through the upper T11 end plate involving a majority of the T9 and T10 vertebral body extending into the posterior elements with mild expansion of the diseased vertebra contributing to the mild spinal stenosis.  Epidural extension of tumor/engorged veins greater on the right extending from T9 to below T12 (most notable epidural tumor at the T12 level).  Diffuse tumor involvement of the T12 vertebral body involving posterior elements with loss of height centrally inferior aspect. Epidural extension of tumor with crowding of the distal cord / conus.  Foraminal extension  of epidural disease more notable T12-L1 then at the T11-T12 level with encroachment upon the exiting T12 nerve roots.  Small areas of tumor involvement T3 through T8 vertebra including T3 and T4 spinous process, left T6 facet and left T8 facet.  MRI LUMBAR SPINE WITHOUT AND WITH CONTRAST  Technique:  Multiplanar and multiecho pulse sequences of the lumbar spine were obtained without and with intravenous contrast.  Contrast: 20mL MULTIHANCE GADOBENATE DIMEGLUMINE 529 MG/ML IV SOLN  Comparison:  CT abdomen and pelvis 03/09/2012.  Findings:  Tumor involvement of all of the visualized vertebra from L1 through the lowest aspect of the sacrum imaged (S3). Tumor involvement of the ilium bilaterally. Most notable tumor involvement L3 vertebral body and right aspect of the sacral ala.  No epidural extension of tumor in the lumbar-sacral spine.  Conus L1 level.  L1-2:  Right posterior lateral/paracentral protrusion.  Slight crowding of the exiting right L1 nerve root and mild impression upon the right ventral aspect of the thecal sac.  L2-3:  Broad-based disc osteophyte complex greater right lateral position with slight impression upon the exiting right L2 nerve root.  Facet joint degenerative changes.  L3-4:   Baseline broad-based disc osteophyte complex.  Lateral extension touches but does not compress the exiting L3 nerve roots. Small central disc protrusion with minimal indentation upon the thecal sac.  Mild facet joint degenerative changes.  L4-5:  Small to moderate sized broad-based disc protrusion greater to the left.  Left greater than right lateral recess stenosis with mild spinal stenosis.  L5-S1:  Minimal bulge.  IMPRESSION: Tumor involvement of all of the visualized vertebra from L1 through the lowest aspect of the sacrum imaged (S3). Tumor involvement of the ilium bilaterally. Most notable tumor involvement L3 vertebral body and right aspect of the sacral ala.  No epidural extension of tumor in the lumbar-sacral spine.  Lumbar spine degenerative changes as detailed above.  Original Report Authenticated By: Fuller Canada, M.D.   Nm Bone Scan Whole Body  03/10/2012  *RADIOLOGY REPORT*  Clinical Data: Lumbar spine pain.  Evaluate lytic lesions.  NUCLEAR MEDICINE WHOLE BODY BONE SCINTIGRAPHY  Technique:  Whole body anterior and posterior images were obtained approximately 3 hours after intravenous injection of radiopharmaceutical.  Radiopharmaceutical: CURIE TC-MDP TECHNETIUM TC 61M MEDRONATE IV KIT  Comparison: CT 03/09/2012.  Findings: Increased uptake is present in the T12 vertebra, greater on the right than left.  This corresponds with the lytic lesions seen on recent CT.  This is best seen on the posterior views with increased uptake projected over the pedicles and extension into the posterior elements.  Degenerative changes are present at the knees, shoulders, and ankles.  There is a small right posterior focus of increased uptake in the right ribs, apparently in the right posterior ninth rib. This could be degenerative or represent a tiny focus of metastatic disease not visualized on prior CT.  Retrospective review of the CT was performed and no definite lytic lesion was present.  IMPRESSION:  Increased radiotracer uptake in the T12 vertebra corresponds with the lytic lesion on recent CT, most compatible with neoplasm, likely metastatic disease or myeloma.  Original Report Authenticated By: Andreas Newport, M.D.   US Soft Tissue Head/neck  03/11/2012  *RADIOLOGY REPORT*  Clinical Data: Asymmetric enlargement on recent CT  THYROID ULTRASOUND  Technique: Ultrasound examination of the thyroid gland and adjacent soft tissues was performed.  Comparison:  CT 03/10/2012  Findings:  Right thyroid lobe:  24 x 26 x  56 mm, inhomogeneous Left thyroid lobe:  24 x 35 x 59 mm Isthmus:  5 mm in thickness  Focal nodules:  7 x 8 x 10 mm complex mostly solid, mid-right 7 x 10 x 15 mm complex mostly solid, inferior right 8 x 12 x 14 mm solid, superior left 16 x 19 x 23 mm complex mostly solid, mid-left 12 x 14 x 14 mm hypoechoic solid, inferior left There are least three additional smaller bilateral and isthmic nodules measuring less than 1 cm.  Lymphadenopathy:  None visualized.  IMPRESSION:  Multiple bilateral thyroid nodules, dominant lesion on the left. Findings meet consensus criteria for biopsy.  Ultrasound-guided fine needle aspiration should be considered, as per the consensus statement: Management of Thyroid Nodules Detected at [place]:  Society of Radiologists in Ultrasound Consensus Conference Statement. Radiology 2005; [id number].  Original Report Authenticated By: Osa Craver, M.D.   Ct Abdomen Pelvis W Contrast  03/09/2012  *RADIOLOGY REPORT*  Clinical Data: LLQ pain, constipation  CT ABDOMEN AND PELVIS WITH CONTRAST  Technique:  Multidetector CT imaging of the abdomen and pelvis was performed following the standard protocol during bolus administration of intravenous contrast.  Contrast: OMNIPAQUE IOHEXOL 300 MG/ML  SOLN  Comparison: None.  Findings: Lung bases are essentially clear.  Small hiatal hernia.  Liver, pancreas, and adrenal glands are within normal limits.  Gallbladder is mildly  distended but without associated inflammatory changes by CT.  No intrahepatic or extrahepatic ductal dilatation.  Kidneys are within normal limits.  No hydronephrosis.  No evidence of bowel obstruction.  Normal appendix.  Colonic diverticulosis, without associated inflammatory changes.  No suspicious abdominopelvic lymphadenopathy.  Prostatomegaly, measuring 6.1 cm in transverse dimension.  Bladder is unremarkable.  Expansile lytic lesion within the left posterior T12 vertebral body (series 2/image 26) with suspected cortical disruption posteriorly (sagittal image 69).  Additional suspected lytic lesions anteriorly in the L1 vertebral body (sagittal image 66) and posteriorly in the L3 vertebral body (sagittal image 63).  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  IMPRESSION: No evidence of bowel obstruction.  Normal appendix.  Colonic diverticulosis, without associated inflammatory changes.  No CT findings to account for the patient's abdominal pain.  Expansile lytic lesion within the left T12 vertebral body with suspected cortical disruption posteriorly.  Additional suspected lytic lesions and L1 and L3.  Primary differential considerations are metastases and myeloma.  Original Report Authenticated By: Charline Bills, M.D.   US Thyroid Biopsy  03/13/2012  *RADIOLOGY REPORT*  Clinical Data/Indication: Dominant left thyroid nodule  ULTRASOUND-GUIDED BIOPSY OF A DOMINANT LEFT THYROID NODULE.  FINE NEEDLE ASPIRATION.  Procedure: The procedure, risks, benefits, and alternatives were explained to the patient. Questions regarding the procedure were encouraged and answered. The patient understands and consents to the procedure.  The neck was prepped with betadine in a sterile fashion, and a sterile drape was applied covering the operative field. A mask and sterile gloves were used for the procedure.  Under sonographic guidance, three 25 gauge fine needle  aspirates of the dominant left thyroid nodule were obtained. Final imaging was performed.  Findings: The images document guide needle placement within the abdominal left thyroid nodule. Post biopsy images demonstrate no hemorrhage.  IMPRESSION: Successful ultrasound-guided fine needle aspiration of a dominant left thyroid nodule.  Original Report Authenticated By: Donavan Burnet, M.D.   Ct Biopsy  03/15/2012  *RADIOLOGY REPORT*  Indication: Evaluate for multiple myeloma  CT GUIDED LEFT ILIAC BONE MARROW ASPIRATION  AND BONE MARROW CORE BIOPSY  Intravenous medications: Fentanyl 100 mcg IV; Versed 1 mg IV  Sedation time: 10 minutes  Contrast volume: None  Complications: None immediate  PROCEDURE/FINDINGS:  Informed consent was obtained from the patient following an explanation of the procedure, risks, benefits and alternatives. The patient understands, agrees and consents for the procedure. All questions were addressed.  A time out was performed prior to the initiation of the procedure.  The patient was positioned prone and noncontrast localization CT was performed of the pelvis to demonstrate the iliac marrow spaces.  The operative site was prepped and draped in the usual sterile fashion.  Under sterile conditions and local anesthesia, an 11 gauge coaxial bone biopsy needle was advanced into the left iliac marrow space. Needle position was confirmed with CT imaging.  Initially, bone marrow aspiration was performed. Next, a bone marrow biopsy was obtained with the 11 gauge outer bone marrow device.  Samples were prepared with the cytotechnologist and deemed adequate.  The needle was removed intact.  Hemostasis was obtained with compression and a dressing was placed. The patient tolerated the procedure well without immediate post procedural complication.  IMPRESSION:  Successful CT guided left iliac bone marrow aspiration and core biopsy.  Original Report Authenticated By: Waynard Reeds, M.D.   Ct  Biopsy  03/13/2012  *RADIOLOGY REPORT*  Clinical Data/Indication: T12 VERTEBRAL BODY LESION  CT-GUIDED BIOPSY HAVE A T12 VERTEBRAL BODY LESION.  CORE.  Sedation: Versed 2.0 mg, Fentanyl 100 mcg.  Total Moderate Sedation Time: 30 minutes.  Procedure: The procedure, risks, benefits, and alternatives were explained to the patient. Questions regarding the procedure were encouraged and answered. The patient understands and consents to the procedure.  The low back in the prone position was prepped with betadine in a sterile fashion, and a sterile drape was applied covering the operative field. A sterile gown and sterile gloves were used for the procedure.  Under CT guidance, the 17 gauge needle was inserted into the T12 vertebral body lesion via left paramedian approach.  Four 18 gauge core biopsies were obtained. Final imaging was performed.  Patient tolerated the procedure well without complication.  Vital sign monitoring by nursing staff during the procedure will continue as patient is in the special procedures unit for post procedure observation.  Findings: The images document guide needle placement within the T12 vertebral body lesion.  Post biopsy images demonstrate no hemorrhage.  IMPRESSION: Successful CT-guided core biopsy of a T12 vertebral body lesion.  Original Report Authenticated By: Donavan Burnet, M.D.   Dg Bone Survey Met  03/15/2012  *RADIOLOGY REPORT*  Clinical Data: Multiple myeloma.  METASTATIC BONE SURVEY  Comparison: None.  Findings: There is one tiny lytic lesion in the frontal bone of the skull on the lateral view.  There are lytic lesions in T12, T10, T9 and T3 and T4.  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  Posterior elements and the vertebral body are partially destroyed at T12.  There are a few tiny areas of lucency in the pelvic bones and in the proximal femurs bilaterally. There are tiny lucent lesions in the right scapula and proximal right  humerus.  The lesions in the sacrum seen on the lumbar MRI are not appreciable on these radiographs.  IMPRESSION: Multiple lesions of multiple myeloma including destruction of the T12 vertebral body.  Lytic lesions elsewhere in the thoracic spine as described and as reported on the thoracic MRI.  Original Report Authenticated By: Gwynn Burly, M.D.      IMPRESSION/PLAN: This is an unfortunate 70 old gentleman with a new diagnosis of multiple myeloma and widespread bony disease. I think it's appropriate for him to proceed with vertebroplasty as this would probably give him more immediate palliation of his pain and also some stabilization. Once released by interventional radiology, he will be an excellent candidate for palliative radiotherapy to his spine.  I discussed the risks benefits and side effects of radiotherapy to the thoracic and lumbar spine with the patient; however, he was fairly groggy and I therefore did not ask him to sign the consent form. This can be signed at simulation.  The extent of tissue that I treat will in part depend on his sites of pain at the time of simuluation.  I will contact interventional radiology team in hopes that they can move forward with vertebroplasty. I will then be happy to follow with radiotherapy. I anticipate 2 weeks of treatment approximately 20 Gy/10 fractions; but, this can be shortened to a 1 week regimen if the patient's performance status is still fragile at simulation.  Of note, I will be on vacation starting on August 15. If the patient is ready, I would be happy to start his radiation planning before then. Otherwise, one of my partners will be able to do this while I am gone.   -----------------------------------  Lonie Peak, MD

## 2012-03-16 ENCOUNTER — Encounter (HOSPITAL_COMMUNITY)
Admission: EM | Disposition: A | Discharge status: Skilled Nursing Facility | Payer: Self-pay | Source: Home / Self Care | Attending: Internal Medicine

## 2012-03-16 ENCOUNTER — Ambulatory Visit (HOSPITAL_COMMUNITY)
Admission: RE | Admit: 2012-03-16 | Payer: BC Managed Care – PPO | Source: Ambulatory Visit | Admitting: Interventional Radiology

## 2012-03-16 ENCOUNTER — Ambulatory Visit (HOSPITAL_COMMUNITY): Admit: 2012-03-16 | Payer: BC Managed Care – PPO

## 2012-03-16 ENCOUNTER — Other Ambulatory Visit (HOSPITAL_COMMUNITY): Payer: BC Managed Care – PPO

## 2012-03-16 DIAGNOSIS — K219 Gastro-esophageal reflux disease without esophagitis: Secondary | ICD-10-CM

## 2012-03-16 LAB — CBC
HCT: 41.1 % (ref 39.0–52.0)
Hemoglobin: 14.2 g/dL (ref 13.0–17.0)
MCHC: 34.5 g/dL (ref 30.0–36.0)
MCV: 93 fL (ref 78.0–100.0)
RDW: 14 % (ref 11.5–15.5)

## 2012-03-16 LAB — BASIC METABOLIC PANEL
BUN: 9 mg/dL (ref 6–23)
Chloride: 102 mEq/L (ref 96–112)
Creatinine, Ser: 0.81 mg/dL (ref 0.50–1.35)
GFR calc Af Amer: >90 mL/min (ref >90)
Glucose, Bld: 223 mg/dL — ABNORMAL HIGH (ref 70–99)
Potassium: 4.4 mEq/L (ref 3.5–5.1)

## 2012-03-16 SURGERY — RADIOLOGY WITH ANESTHESIA
Anesthesia: General

## 2012-03-16 MED ORDER — DEXAMETHASONE 4 MG PO TABS
4.0000 mg | ORAL_TABLET | Freq: Four times a day (QID) | ORAL | Status: DC
  Administered 2012-03-16 – 2012-03-22 (×21): 4 mg via ORAL
  Filled 2012-03-16 (×29): qty 1

## 2012-03-16 MED ORDER — OXYCODONE HCL 20 MG PO TB12
20.0000 mg | ORAL_TABLET | Freq: Two times a day (BID) | ORAL | Status: DC
  Administered 2012-03-16 – 2012-03-24 (×15): 20 mg via ORAL
  Filled 2012-03-16 (×15): qty 1

## 2012-03-16 NOTE — Progress Notes (Addendum)
[facility] PROGRESS NOTE  Robert Berger ZOX:[id number] DOB: 1941-06-21 DOA: 03/09/2012 PCP: No primary provider on file.  Brief narrative:  71 year old man who presented to the hospital on 03/09/12 with a 1 week history of abdominal pain, back pain  and constipation. CT scan findings showed expansile lytic lesions involving thoracic and lumbar spine  The patient underwent CT guided biopsy of T 12 lesion with pathology finding consistent with plasma cell disorder. Diagnostic bone marrow biopsy subsequently done with preliminary finding of 11% plasma cells. Bone marrow aspirate for cytogenics and myeloma FISH panel are still pending.  Assessment/Plan:   Principal Problem:  *Back pain secondary to IgG kappa multiple myeloma Per oncology patient needs local therapy to take care of his pain before chemotherapy which includes kyphoplasty and palliative radiation (after kyphoplasty) I appreciate very much a wonderful explanation by an oncologist in regards to multiple myeloma treatment plan which is dependant on cytogenetics results and FISH panel Additionally, patient would benefit from Zometa to prevent skeletal related events but prior to that he would need dental evaluation as zometa can cause osteonecrosis of the jaw and this patient already has a poor dentition Continue dexamethasone IV 4 mg Q 4 hours  Pain regimen: Oxycontin 20 mg BID PO scheduled and dilaudid 1 mg Q 4 hours IV PRN and Roxicodone 5 mg Q 4 hours PRN PT evaluation - follow up; patient may benefit from short term rehab  Active Problems:  Thyromegaly, probable goiter  TSH WNL.  Thyroid ultrasound done 03/11/12, shows multiple nodules  status post biopsy[- pending pathology  Hiatal hernia and fluid filled esophagus / GERD  PPI started for symptoms of GERD.  Outpatient GI follow up  Abdominal pain  Likely radiating pain from back related to multiple myeloma Constipation likely but patient did have a BM today Continue  current bowel regimen    Code Status: Full  Family Communication: no family at bedside Disposition Plan: PT evaluation   Medical Consultants:  Dr. Arlan Organ, Oncology. Other Consultants:  Physical therapy Occupational therapy:  3:1 commode recommended. Procedures:  None. Antibiotics:  None.  [name], MD  [facility] Pager [phone]  If 7PM-7AM, please contact night-coverage [email or web address] Password [email or web address] 03/16/2012, 6:09 PM   LOS: 7 days   HPI/Subjective: No significant events overnight. Still complains of pain.  Objective: Filed Vitals:   03/15/12 1440 03/15/12 2115 03/16/12 0629 03/16/12 1410  BP: 112/67 99/65 130/82 123/57  Pulse: 90 72 109 102  Temp: 98.1 F (36.7 C) 98.2 F (36.8 C) 98.3 F (36.8 C) 97.6 F (36.4 C)  TempSrc: Oral Oral Oral Oral  Resp: 16 16 18 18   Height:      Weight:      SpO2: 93% 94% 94% 95%    Intake/Output Summary (Last 24 hours) at 03/16/12 1809 Last data filed at 03/16/12 1500  Gross per 24 hour  Intake   1080 ml  Output   3870 ml  Net  -2790 ml    Exam:   General:  Pt is alert, follows commands appropriately, not in acute distress  Cardiovascular: Regular rate and rhythm, S1/S2, no murmurs, no rubs, no gallops  Respiratory: Clear to auscultation bilaterally, no wheezing, no crackles, no rhonchi  Abdomen: Soft, non tender, non distended, bowel sounds present, no guarding  Extremities: No edema, pulses DP and PT palpable bilaterally  Neuro: Grossly nonfocal  Data Reviewed: Basic Metabolic Panel:  Lab 03/16/12 8295 03/15/12 0402 03/10/12 0320  NA 132* 129*  132*  K 4.4 4.0 3.9  CL 102 96 98  CO2 21 27 27   GLUCOSE 223* 150* 103*  BUN 9 7 10   CREATININE 0.81 0.83 0.80  CALCIUM 8.2* 8.2* 8.7  MG -- -- --  PHOS -- -- --   Liver Function Tests: No results found for this basename: AST:5,ALT:5,ALKPHOS:5,BILITOT:5,PROT:5,ALBUMIN:5 in the last 168 hours No results found for this basename:  LIPASE:5,AMYLASE:5 in the last 168 hours No results found for this basename: AMMONIA:5 in the last 168 hours  CBC:  Lab 03/16/12 0356 03/15/12 0402 03/13/12 0428  WBC 4.3 4.3 4.4  NEUTROABS -- -- --  HGB 14.2 13.4 13.4  HCT 41.1 37.9* 39.0  MCV 93.0 91.5 93.1  PLT 144* 123* 138*   Cardiac Enzymes: No results found for this basename: CKTOTAL:5,CKMB:5,CKMBINDEX:5,TROPONINI:5 in the last 168 hours BNP: No components found with this basename: POCBNP:5 CBG: No results found for this basename: GLUCAP:5 in the last 168 hours  No results found for this or any previous visit (from the past 240 hour(s)).   Studies: Mr Cervical Spine W Contrast 03/15/2012  *RADIOLOGY REPORT*  Clinical Data:  Back pain.  Possible myeloma.  MRI CERVICAL AND THORACIC SPINE WITH CONTRAST  Technique:  Multiplanar and multiecho pulse sequences of the cervical spine, to include the craniocervical junction and cervicothoracic junction, and the thoracic spine, were obtained with contrast.  Contrast: 20mL MULTIHANCE GADOBENATE DIMEGLUMINE 529 MG/ML IV SOLN  Comparison:  Precontrast MR of the cervical and thoracic spine performed 03/14/2012.  MRI CERVICAL SPINE  Findings:  Motion degraded exam.  Abnormal areas of enhancement consistent with involvement by tumor involving portions of the clivus, occipital condyles and all of the cervical vertebra.  Most prominent involvement is that involving the C2, C3 and C5 vertebral body as well as the right C5 facet and left C7 facet.  No obvious epidural extension of tumor noted on this motion degraded examination nor is there evidence of foraminal extension.  Regions of altered signal intensity involving portions of the cervical cord at the C3 and C5-6 level probably related to motion artifact rather than  involvement by tumor.  Enlarged left thyroid gland.  Tumor not excluded.  Ultrasound can be obtained for further delineation.  IMPRESSION: Motion degraded exam.  Abnormal areas of  enhancement consistent with involvement by tumor involving portions of the clivus, occipital condyles and all of the cervical vertebra.  Most prominent involvement is that involving the C2, C3 and C5 vertebral body as well as the right C5 facet and left C7 facet.  No obvious epidural extension of tumor noted on this motion degraded examination nor is there evidence of foraminal extension.  Enlarged left thyroid gland.  Tumor not excluded.  Ultrasound can be obtained for further delineation.  MRI THORACIC SPINE  Findings: Only a significantly motion degraded sagittal post contrast examination of the thoracic spine was able to be obtained secondary to patient discomfort. Tumor involvement of T12 vertebra is better delineated on the lumbar spine MR.  Diffuse tumor involvement extends from the inferior T8 plate through the upper T11 end plate involving a majority of the T9 and T10 vertebral body extending into the posterior elements with mild expansion of the diseased vertebra contributing to the mild spinal stenosis.  Epidural extension of tumor/engorged veins greater on the right extending from T9 to below T12 (most notable epidural tumor at the T12 level).  Diffuse tumor involvement of the T12 vertebral body involving posterior elements with loss of height  centrally inferior aspect. Epidural extension of tumor with crowding of the distal cord / conus.  Foraminal extension of epidural disease more notable T12-L1 then at the T11-T12 level with encroachment upon the exiting T12 nerve roots.  Small areas of tumor involvement T3 through T8 vertebra including T3 and T4 spinous process, left T6 facet and left T8 facet.  IMPRESSION: Only a significantly motion degraded sagittal post contrast examination of the thoracic spine was able to be obtained.  Diffuse tumor involvement extends from the inferior T8 plate through the upper T11 end plate involving a majority of the T9 and T10 vertebral body extending into the posterior  elements with mild expansion of the diseased vertebra contributing to the mild spinal stenosis.  Epidural extension of tumor/engorged veins greater on the right extending from T9 to below T12 (most notable epidural tumor at the T12 level).  Diffuse tumor involvement of the T12 vertebral body involving posterior elements with loss of height centrally inferior aspect. Epidural extension of tumor with crowding of the distal cord / conus.  Foraminal extension of epidural disease more notable T12-L1 then at the T11-T12 level with encroachment upon the exiting T12 nerve roots.  Small areas of tumor involvement T3 through T8 vertebra including T3 and T4 spinous process, left T6 facet and left T8 facet.  MRI LUMBAR SPINE WITHOUT AND WITH CONTRAST  Technique:  Multiplanar and multiecho pulse sequences of the lumbar spine were obtained without and with intravenous contrast.  Contrast: 20mL MULTIHANCE GADOBENATE DIMEGLUMINE 529 MG/ML IV SOLN  Comparison:  CT abdomen and pelvis 03/09/2012.  Findings:  Tumor involvement of all of the visualized vertebra from L1 through the lowest aspect of the sacrum imaged (S3). Tumor involvement of the ilium bilaterally. Most notable tumor involvement L3 vertebral body and right aspect of the sacral ala.  No epidural extension of tumor in the lumbar-sacral spine.  Conus L1 level.  L1-2:  Right posterior lateral/paracentral protrusion.  Slight crowding of the exiting right L1 nerve root and mild impression upon the right ventral aspect of the thecal sac.  L2-3:  Broad-based disc osteophyte complex greater right lateral position with slight impression upon the exiting right L2 nerve root.  Facet joint degenerative changes.  L3-4:  Baseline broad-based disc osteophyte complex.  Lateral extension touches but does not compress the exiting L3 nerve roots. Small central disc protrusion with minimal indentation upon the thecal sac.  Mild facet joint degenerative changes.  L4-5:  Small to moderate  sized broad-based disc protrusion greater to the left.  Left greater than right lateral recess stenosis with mild spinal stenosis.  L5-S1:  Minimal bulge.  IMPRESSION: Tumor involvement of all of the visualized vertebra from L1 through the lowest aspect of the sacrum imaged (S3). Tumor involvement of the ilium bilaterally. Most notable tumor involvement L3 vertebral body and right aspect of the sacral ala.  No epidural extension of tumor in the lumbar-sacral spine.  Lumbar spine degenerative changes as detailed above.  Original Report Authenticated By: Fuller Canada, M.D.   Mr Thoracic Spine W Contrast 03/15/2012  *RADIOLOGY REPORT*  Clinical Data:  Back pain.  Possible myeloma.  MRI CERVICAL AND THORACIC SPINE WITH CONTRAST  Technique:  Multiplanar and multiecho pulse sequences of the cervical spine, to include the craniocervical junction and cervicothoracic junction, and the thoracic spine, were obtained with contrast.  Contrast: 20mL MULTIHANCE GADOBENATE DIMEGLUMINE 529 MG/ML IV SOLN  Comparison:  Precontrast MR of the cervical and thoracic spine performed 03/14/2012.  MRI  CERVICAL SPINE  Findings:  Motion degraded exam.  Abnormal areas of enhancement consistent with involvement by tumor involving portions of the clivus, occipital condyles and all of the cervical vertebra.  Most prominent involvement is that involving the C2, C3 and C5 vertebral body as well as the right C5 facet and left C7 facet.  No obvious epidural extension of tumor noted on this motion degraded examination nor is there evidence of foraminal extension.  Regions of altered signal intensity involving portions of the cervical cord at the C3 and C5-6 level probably related to motion artifact rather than  involvement by tumor.  Enlarged left thyroid gland.  Tumor not excluded.  Ultrasound can be obtained for further delineation.  IMPRESSION: Motion degraded exam.  Abnormal areas of enhancement consistent with involvement by tumor involving  portions of the clivus, occipital condyles and all of the cervical vertebra.  Most prominent involvement is that involving the C2, C3 and C5 vertebral body as well as the right C5 facet and left C7 facet.  No obvious epidural extension of tumor noted on this motion degraded examination nor is there evidence of foraminal extension.  Enlarged left thyroid gland.  Tumor not excluded.  Ultrasound can be obtained for further delineation.  MRI THORACIC SPINE  Findings: Only a significantly motion degraded sagittal post contrast examination of the thoracic spine was able to be obtained secondary to patient discomfort. Tumor involvement of T12 vertebra is better delineated on the lumbar spine MR.  Diffuse tumor involvement extends from the inferior T8 plate through the upper T11 end plate involving a majority of the T9 and T10 vertebral body extending into the posterior elements with mild expansion of the diseased vertebra contributing to the mild spinal stenosis.  Epidural extension of tumor/engorged veins greater on the right extending from T9 to below T12 (most notable epidural tumor at the T12 level).  Diffuse tumor involvement of the T12 vertebral body involving posterior elements with loss of height centrally inferior aspect. Epidural extension of tumor with crowding of the distal cord / conus.  Foraminal extension of epidural disease more notable T12-L1 then at the T11-T12 level with encroachment upon the exiting T12 nerve roots.  Small areas of tumor involvement T3 through T8 vertebra including T3 and T4 spinous process, left T6 facet and left T8 facet.  IMPRESSION: Only a significantly motion degraded sagittal post contrast examination of the thoracic spine was able to be obtained.  Diffuse tumor involvement extends from the inferior T8 plate through the upper T11 end plate involving a majority of the T9 and T10 vertebral body extending into the posterior elements with mild expansion of the diseased vertebra  contributing to the mild spinal stenosis.  Epidural extension of tumor/engorged veins greater on the right extending from T9 to below T12 (most notable epidural tumor at the T12 level).  Diffuse tumor involvement of the T12 vertebral body involving posterior elements with loss of height centrally inferior aspect. Epidural extension of tumor with crowding of the distal cord / conus.  Foraminal extension of epidural disease more notable T12-L1 then at the T11-T12 level with encroachment upon the exiting T12 nerve roots.  Small areas of tumor involvement T3 through T8 vertebra including T3 and T4 spinous process, left T6 facet and left T8 facet.  MRI LUMBAR SPINE WITHOUT AND WITH CONTRAST  Technique:  Multiplanar and multiecho pulse sequences of the lumbar spine were obtained without and with intravenous contrast.  Contrast: 20mL MULTIHANCE GADOBENATE DIMEGLUMINE 529 MG/ML IV SOLN  Comparison:  CT abdomen and pelvis 03/09/2012.  Findings:  Tumor involvement of all of the visualized vertebra from L1 through the lowest aspect of the sacrum imaged (S3). Tumor involvement of the ilium bilaterally. Most notable tumor involvement L3 vertebral body and right aspect of the sacral ala.  No epidural extension of tumor in the lumbar-sacral spine.  Conus L1 level.  L1-2:  Right posterior lateral/paracentral protrusion.  Slight crowding of the exiting right L1 nerve root and mild impression upon the right ventral aspect of the thecal sac.  L2-3:  Broad-based disc osteophyte complex greater right lateral position with slight impression upon the exiting right L2 nerve root.  Facet joint degenerative changes.  L3-4:  Baseline broad-based disc osteophyte complex.  Lateral extension touches but does not compress the exiting L3 nerve roots. Small central disc protrusion with minimal indentation upon the thecal sac.  Mild facet joint degenerative changes.  L4-5:  Small to moderate sized broad-based disc protrusion greater to the left.   Left greater than right lateral recess stenosis with mild spinal stenosis.  L5-S1:  Minimal bulge.  IMPRESSION: Tumor involvement of all of the visualized vertebra from L1 through the lowest aspect of the sacrum imaged (S3). Tumor involvement of the ilium bilaterally. Most notable tumor involvement L3 vertebral body and right aspect of the sacral ala.  No epidural extension of tumor in the lumbar-sacral spine.  Lumbar spine degenerative changes as detailed above.  Original Report Authenticated By: Fuller Canada, M.D.   Mr Lumbar Spine W Wo Contrast 03/15/2012  *RADIOLOGY REPORT*  Clinical Data:  Back pain.  Possible myeloma.  MRI CERVICAL AND THORACIC SPINE WITH CONTRAST  Technique:  Multiplanar and multiecho pulse sequences of the cervical spine, to include the craniocervical junction and cervicothoracic junction, and the thoracic spine, were obtained with contrast.  Contrast: 20mL MULTIHANCE GADOBENATE DIMEGLUMINE 529 MG/ML IV SOLN  Comparison:  Precontrast MR of the cervical and thoracic spine performed 03/14/2012.  MRI CERVICAL SPINE  Findings:  Motion degraded exam.  Abnormal areas of enhancement consistent with involvement by tumor involving portions of the clivus, occipital condyles and all of the cervical vertebra.  Most prominent involvement is that involving the C2, C3 and C5 vertebral body as well as the right C5 facet and left C7 facet.  No obvious epidural extension of tumor noted on this motion degraded examination nor is there evidence of foraminal extension.  Regions of altered signal intensity involving portions of the cervical cord at the C3 and C5-6 level probably related to motion artifact rather than  involvement by tumor.  Enlarged left thyroid gland.  Tumor not excluded.  Ultrasound can be obtained for further delineation.  IMPRESSION: Motion degraded exam.  Abnormal areas of enhancement consistent with involvement by tumor involving portions of the clivus, occipital condyles and all of the  cervical vertebra.  Most prominent involvement is that involving the C2, C3 and C5 vertebral body as well as the right C5 facet and left C7 facet.  No obvious epidural extension of tumor noted on this motion degraded examination nor is there evidence of foraminal extension.  Enlarged left thyroid gland.  Tumor not excluded.  Ultrasound can be obtained for further delineation.  MRI THORACIC SPINE  Findings: Only a significantly motion degraded sagittal post contrast examination of the thoracic spine was able to be obtained secondary to patient discomfort. Tumor involvement of T12 vertebra is better delineated on the lumbar spine MR.  Diffuse tumor involvement extends from the inferior  T8 plate through the upper T11 end plate involving a majority of the T9 and T10 vertebral body extending into the posterior elements with mild expansion of the diseased vertebra contributing to the mild spinal stenosis.  Epidural extension of tumor/engorged veins greater on the right extending from T9 to below T12 (most notable epidural tumor at the T12 level).  Diffuse tumor involvement of the T12 vertebral body involving posterior elements with loss of height centrally inferior aspect. Epidural extension of tumor with crowding of the distal cord / conus.  Foraminal extension of epidural disease more notable T12-L1 then at the T11-T12 level with encroachment upon the exiting T12 nerve roots.  Small areas of tumor involvement T3 through T8 vertebra including T3 and T4 spinous process, left T6 facet and left T8 facet.  IMPRESSION: Only a significantly motion degraded sagittal post contrast examination of the thoracic spine was able to be obtained.  Diffuse tumor involvement extends from the inferior T8 plate through the upper T11 end plate involving a majority of the T9 and T10 vertebral body extending into the posterior elements with mild expansion of the diseased vertebra contributing to the mild spinal stenosis.  Epidural extension of  tumor/engorged veins greater on the right extending from T9 to below T12 (most notable epidural tumor at the T12 level).  Diffuse tumor involvement of the T12 vertebral body involving posterior elements with loss of height centrally inferior aspect. Epidural extension of tumor with crowding of the distal cord / conus.  Foraminal extension of epidural disease more notable T12-L1 then at the T11-T12 level with encroachment upon the exiting T12 nerve roots.  Small areas of tumor involvement T3 through T8 vertebra including T3 and T4 spinous process, left T6 facet and left T8 facet.  MRI LUMBAR SPINE WITHOUT AND WITH CONTRAST  Technique:  Multiplanar and multiecho pulse sequences of the lumbar spine were obtained without and with intravenous contrast.  Contrast: 20mL MULTIHANCE GADOBENATE DIMEGLUMINE 529 MG/ML IV SOLN  Comparison:  CT abdomen and pelvis 03/09/2012.  Findings:  Tumor involvement of all of the visualized vertebra from L1 through the lowest aspect of the sacrum imaged (S3). Tumor involvement of the ilium bilaterally. Most notable tumor involvement L3 vertebral body and right aspect of the sacral ala.  No epidural extension of tumor in the lumbar-sacral spine.  Conus L1 level.  L1-2:  Right posterior lateral/paracentral protrusion.  Slight crowding of the exiting right L1 nerve root and mild impression upon the right ventral aspect of the thecal sac.  L2-3:  Broad-based disc osteophyte complex greater right lateral position with slight impression upon the exiting right L2 nerve root.  Facet joint degenerative changes.  L3-4:  Baseline broad-based disc osteophyte complex.  Lateral extension touches but does not compress the exiting L3 nerve roots. Small central disc protrusion with minimal indentation upon the thecal sac.  Mild facet joint degenerative changes.  L4-5:  Small to moderate sized broad-based disc protrusion greater to the left.  Left greater than right lateral recess stenosis with mild spinal  stenosis.  L5-S1:  Minimal bulge.  IMPRESSION: Tumor involvement of all of the visualized vertebra from L1 through the lowest aspect of the sacrum imaged (S3). Tumor involvement of the ilium bilaterally. Most notable tumor involvement L3 vertebral body and right aspect of the sacral ala.  No epidural extension of tumor in the lumbar-sacral spine.  Lumbar spine degenerative changes as detailed above.  Original Report Authenticated By: Fuller Canada, M.D.   Ct Biopsy 03/15/2012  *  IMPRESSION:  Successful CT guided left iliac bone marrow aspiration and core biopsy.    Dg Bone Survey Met 03/15/2012  *  IMPRESSION: Multiple lesions of multiple myeloma including destruction of the T12 vertebral body.  Lytic lesions elsewhere in the thoracic spine.   Scheduled Meds:   . baclofen  5 mg Oral TID  . dexamethasone  4 mg Oral Q6H  . docusate sodium  100 mg Oral BID  . feeding supplement  237 mL Oral BID BM  . LORazepam  1 mg Intravenous Once  . oxyCODONE  20 mg Oral Q12H  . pantoprazole  40 mg Oral Q1200   Continuous Infusions:   . sodium chloride 50 mL/hr at 03/16/12 0246

## 2012-03-16 NOTE — Progress Notes (Signed)
PT Cancellation Note  Treatment cancelled today due to medical issues with patient which prohibited therapy.  Results of  Tests reviewed and note that pt is under consideration for interventional radiology.  Will discontinue PT attempts at this time due to uncontrolled pain and spinal instability. Please reorder PT when pt will be ready to participate and progress ambulation  Donnetta Hail 03/16/2012, 1:54 PM

## 2012-03-16 NOTE — Progress Notes (Signed)
Lengthy visit.  Patient gave me his life history. 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 He is taking in all the pieces of knowledge and putting them to together to figure out what the cancer means for his life; this means getting all the information he can get is important to him. He has been with his current partner 18 years. They have a deep and caring relationship. He is very smart, sharp, and perceptive. 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  Presence; listening; support.  03/16/12 1600  Clinical Encounter Type  Visited With Patient  Visit Type Initial;Spiritual support;Social support  Referral From Nurse  Consult/Referral To Chaplain  Recommendations Follow up  Spiritual Encounters  Spiritual Needs Emotional;Other (Comment) (Presence; listening)  Stress Factors  Patient Stress Factors Health changes

## 2012-03-16 NOTE — Progress Notes (Addendum)
BP 130/82  Pulse 109  Temp 98.3 F (36.8 C) (Oral)  Resp 18  Ht 5\' 8"  (1.727 m)  Wt 250 lb (113.399 kg)  BMI 38.01 kg/m2  SpO2 94% Pt much more alert and coherent today. His wife is also present. Reviewed notes and communication from Dr. Basilio Cairo. Agree that we could do co-ablation procedure prior to palliative radiation. We re-discussed co-ablation procedure today as a part of his treatment plan. He has a better understanding of that and seems more willing to proceed with getting that set up. However, he and his wife still have many questions regarding his diagnosis, treatment plans, overall prognosis, etc. We will begin looking at next available time on schedule to set up co-ablation procedure with Dr. Corliss Skains, as this will need to be done at [facility] with general anesthesia. Will contact Dr. Gustavo Lah office, though per his note, he will be out of town. Perhaps a partner will be able to come and discuss with pt and family.   Brayton El PA-C 03/16/2012 11:27 AM  Tentatively next available with Dr. Corliss Skains and anesthesia at [facility] is Tues 8/13. We are placing on schedule for then.  1:49 PM

## 2012-03-16 NOTE — Consult Note (Signed)
[facility] INPATIENT PROGRESS NOTE - CONSULT FOLLOW UP NOTE  Name: Robert Berger      MRN: [id number]    Location: 1334/1334-01  Date: 03/16/2012 Time:6:08 PM   Subjective: Interval History:Mathan Blancett was diagnosed with multiple myeloma today with returned pathology.  Dr. Myna Hidalgo is out of town; therefore, primary team asked me for discussion of pathology result.    I personally reviewed his PMH, current presentation, lab test, radiology.     He was in [facility] until a few weeks ago when he hurt his left back when he twisted his back.  However, recently, he developed severe bilateral abdominal pain and back pain.  Pain is crampy; 10/10; decreased to 4/10 with pain med.  He denied bone pain elsewhere.  He denied anorexia, SOB, chest pain, recurrent infection, paresthesia, leg weakness, bowel/bladder incontinence.   Work up on 03/09/12 with abd CT which showed an expansile lytic lesion within the left T12 vertebral body with suspected cortical disruption posteriorly.  There were additional suspected lytic lesions and L1 and L3. CT chest on 03/10/12 showed no lung mass.  However, there were soft tissue density osteolytic lesions occupying T9 and T10 vertebral bodies, with possible small amount of extraosseous  extension to the right of T10.  He underwent CT-guided biopsy of T12 lesion with pathology today consistent with plasma cell disorder.  He underwent on 03/15/2012 diagnostic bone marrow biopsy with prelim result of 11% plasma cell.  Bone marrow aspirate was sent for cytogenetics and myeloma FISH panel; result of both are pending for about 3 more weeks.      Objective: Vital signs in last 24 hours: Temp:  [97.6 F (36.4 C)-98.3 F (36.8 C)] 97.6 F (36.4 C) (08/08 1410) Pulse Rate:  [72-109] 102  (08/08 1410) Resp:  [16-18] 18  (08/08 1410) BP: (99-130)/(57-82) 123/57 mmHg (08/08 1410) SpO2:  [94 %-95 %] 95 % (08/08 1410)     PHYSICAL EXAM:  Gen: obese man, in no acute  distress. Eyes: No scleral icterus or jaundice. ENT: There was no oropharyngeal lesions. Neck was supple without thyromegaly. Lymphatics: Negative for cervical, supraclavicular, axillary, or inguinal adenopathy.  Respiratory: Lungs were clear bilaterally without wheezing or crackles. Cardiovascular: normal heart rate and rhythm; S1/S2; without murmur, rubs, or gallop. There was no pedal edema. GI: Abdomen was soft, flat, nondistended, without organomegaly. There was tenderness to palpation of diffuse all four abdominal quadrants. He could not sit up for me to palpate his vertebral spine due to pain.  Skin exam was without ecchymosis, petechiae. Neuro exam was nonfocal.  He was moving all 4 extremities.   Studies/Results: WBC 4.3; Hgb 14.2; Plt 144.  Cr 0.81;  Na 132;  Albumin at 3.9.  SPEP with positive M spike at 1.95 gm/L; Ig G elevated at 3010 mg/dL; IgA 50; IgM 40.  Serum kappa:lambda ratio elevated at 6.15.   beta2 microglobulin  at 2.34 mg/dL  MEDICATIONS: reviewed.     Assessment/Plan:  IgG kappa multiple myeloma; International staging system stage I due to (beta2 microglobulin <3.5 and albumin >3.5).  Staging in myeloma is not as important as cytogenetics and myeloma FISH panel.  Patients with del17 tends to do very poorly with available treatments.  He does not have anemia, hypercalcemia, or renal failure.  However, he has very symptomatic lytic bone lesions.  He will need systemic treatment.  However, he needs local therapy first to take care of his bone pain before chemo.  I agree with kyphoplasty  and palliative radiation.  I discussed with him and his wife potential chemotherapy regimens including but not limited to Revlimid/Cytoxan/Dex; or Revlimid/Velcade/Dex.  The choice of chemo is physician-dependent and risk dependent.  If he has del 17 mutation, he would benefit from Velcade-containing regimen.  Therefore, I will defer the exact treatment regimen to Dr. Myna Hidalgo when he returns and  cytogenetic/FISH data are available.   He will also benefit from Zometa or pamidronate to decrease risk of skeletal-related event (pain, fracture).  However, he will needs dental clearance first to decrease risk of osteonecrosis of the jaw.   Given his age of 7; he may not be a candidate for autologous bone marrow transplant.  However, depending on how he tolerates induction chemo; and aggressiveness of the transplant center, he may be considered.  Again, this is to be discussed after a few cycles of chemo.   RECOMMENDATIONS: - I switched IV dexamethasone to 4mg  PO q6hours.  This is to be down titrated by Dr. Myna Hidalgo as outpatient.  - I increased OxyContin from 15mg  to 20mg  PO BID since he still requires breakthrough Dilaudid.  - Hospitalist to consider switching IV Dilaudid to oral Dilaudid for breakthrough after kyphoplasty.  - I agree with Dr. Basilio Cairo to start palliative radiation after kyphoplasty.  - Consider setting up for SNF as patient will benefit from rehab in my opinion.  - I placed Dental consult before starting bisphosphonate.  - I ordered 24hour urine collection for urine protein electrophoresis.     The length of time of the face-to-face encounter was 60  minutes. More than 50% of time was spent counseling and coordination of care.

## 2012-03-17 ENCOUNTER — Encounter (HOSPITAL_COMMUNITY): Payer: Self-pay | Admitting: Pharmacy Technician

## 2012-03-17 LAB — BASIC METABOLIC PANEL
BUN: 16 mg/dL (ref 6–23)
Calcium: 8.2 mg/dL — ABNORMAL LOW (ref 8.4–10.5)
Chloride: 103 mEq/L (ref 96–112)
Creatinine, Ser: 0.75 mg/dL (ref 0.50–1.35)
GFR calc Af Amer: >90 mL/min (ref >90)
GFR calc non Af Amer: >90 mL/min (ref >90)

## 2012-03-17 LAB — CBC
HCT: 38.6 % — ABNORMAL LOW (ref 39.0–52.0)
MCH: 32.4 pg (ref 26.0–34.0)
MCHC: 35.2 g/dL (ref 30.0–36.0)
MCV: 91.9 fL (ref 78.0–100.0)
RDW: 14.3 % (ref 11.5–15.5)

## 2012-03-17 NOTE — Progress Notes (Signed)
Nutrition Follow-up  Intervention: Nutrition signing off, all goals met, pt eating excellent without any nutritional needs. Will continue to monitor intake and be available as needed.   Diet Order: Regular, intake 100% of meals, Ensure intake variable, typically has been getting 1/day   - Pt found to have multiple myeloma. Plan is for ablation/vertebroplasty followed by palliative radiation therapy. No BM since 8/5. Pt has been eating excellent.   Meds: Scheduled Meds:   . baclofen  5 mg Oral TID  . dexamethasone  4 mg Oral Q6H  . docusate sodium  100 mg Oral BID  . feeding supplement  237 mL Oral BID BM  . LORazepam  1 mg Intravenous Once  . oxyCODONE  20 mg Oral Q12H  . pantoprazole  40 mg Oral Q1200  . DISCONTD: dexamethasone  20 mg Intravenous Q24H  . DISCONTD: oxyCODONE  15 mg Oral Q12H   Continuous Infusions:   . sodium chloride 50 mL/hr at 03/16/12 2136   PRN Meds:.acetaminophen, acetaminophen, alum & mag hydroxide-simeth, bisacodyl, HYDROmorphone (DILAUDID) injection, hyoscyamine, methocarbamol, ondansetron (ZOFRAN) IV, ondansetron, oxyCODONE, sodium chloride, zolpidem  Labs:  CMP     Component Value Date/Time   NA 134* 03/17/2012 0347   K 4.4 03/17/2012 0347   CL 103 03/17/2012 0347   CO2 23 03/17/2012 0347   GLUCOSE 149* 03/17/2012 0347   BUN 16 03/17/2012 0347   CREATININE 0.75 03/17/2012 0347   CALCIUM 8.2* 03/17/2012 0347   PROT 9.2* 03/09/2012 1100   ALBUMIN 3.9 03/09/2012 1100   AST 38* 03/09/2012 1100   ALT 51 03/09/2012 1100   ALKPHOS 77 03/09/2012 1100   BILITOT 1.2 03/09/2012 1100   GFRNONAA >90 03/17/2012 0347   GFRAA >90 03/17/2012 0347     Intake/Output Summary (Last 24 hours) at 03/17/12 1637 Last data filed at 03/17/12 1200  Gross per 24 hour  Intake 2783.33 ml  Output   1300 ml  Net 1483.33 ml    Weight Status:  No new weights  Estimated needs:   1750-2100 calories 85-105g protein  Nutrition Dx: Increased nutrient needs - ongoing  Goal: Pt to consume >90% of  meals/supplements.   Monitor: Intake  Dietitian# [phone]

## 2012-03-17 NOTE — Progress Notes (Signed)
[facility] PROGRESS NOTE  Robert Berger ZOX:[id number] DOB: 19-Dec-1940 DOA: 03/09/2012 PCP: No primary provider on file.   Brief narrative:  71 year old man who presented to the hospital on 03/09/12 with a 1 week history of abdominal pain, back pain and constipation. CT scan findings showed expansile lytic lesions involving thoracic and lumbar spine The patient underwent CT guided biopsy of T 12 lesion with pathology finding consistent with plasma cell disorder. Diagnostic bone marrow biopsy subsequently done with preliminary finding of 11% plasma cells. Bone marrow aspirate for cytogenics and myeloma FISH panel are still pending.  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  Per oncology patient needs local therapy to take care of his pain before chemotherapy which includes kyphoplasty and palliative radiation (after kyphoplasty)  I appreciate very much a wonderful explanation by an oncologist in regards to multiple myeloma treatment plan which is dependant on cytogenetics results and FISH panel  Pre oncology the patient would benefit from Zometa to prevent skeletal related events but prior to that he would need dental evaluation as zometa can cause osteonecrosis of the jaw and this patient already has a poor dentition  Continue dexamethasone IV 4 mg Q 4 hours; monitor CBG at least twice daily  Current pain regimen: Oxycontin 20 mg BID PO scheduled and dilaudid 1 mg Q 4 hours IV PRN and Roxicodone 5 mg Q 4 hours PRN  PT evaluation - although appropriate may not be done at this time due to back pain with evan small starins  Active Problems:  Thyromegaly, probable goiter  TSH WNL.  Thyroid ultrasound done 03/11/12, shows multiple nodules  status post biopsy[- pending pathology  Hiatal hernia and fluid filled esophagus / GERD  Continue  Protonix daily Outpatient GI follow up  Abdominal pain  Likely radiating pain from back related to multiple myeloma  Constipation likely but patient did have a BM today  Continue current bowel regimen   Code Status: Full  Family Communication: no family at bedside  Disposition Plan: PT evaluation - pending  Medical Consultants:  Dr. Arlan Organ, Oncology.  Other Consultants:  Physical therapy  Occupational therapy: 3:1 commode recommended. Procedures:  Plan 03/21/2012 ablation/vertebroplasty procedure of T9-T12 with general anesthesia Antibiotics:  None.  [name], MD  [facility] Pager [phone]  If 7PM-7AM, please contact night-coverage [email or web address] Password [email or web address] 03/17/2012, 10:22 AM   LOS: 8 days   HPI/Subjective: No acute events overnight.  Objective: Filed Vitals:   03/16/12 1410 03/16/12 2133 03/17/12 0208 03/17/12 0606  BP: 123/57 159/91  134/83  Pulse: 102 95  82  Temp: 97.6 F (36.4 C) 98.2 F (36.8 C)  98.8 F (37.1 C)  TempSrc: Oral Oral  Oral  Resp: 18 18 13 18   Height:      Weight:      SpO2: 95% 98%  99%    Intake/Output Summary (Last 24 hours) at 03/17/12 1022 Last data filed at 03/17/12 0900  Gross per 24 hour  Intake 2783.33 ml  Output   2050 ml  Net 733.33 ml    Exam:   General:  Pt is alert, follows commands appropriately, not in acute distress  Cardiovascular: Regular rate and rhythm, S1/S2, no murmurs, no rubs, no gallops  Respiratory: Clear to auscultation bilaterally, no wheezing, no crackles, no rhonchi  Abdomen: Soft, non tender, non distended, bowel sounds present,  no guarding  Extremities: No edema, pulses DP and PT palpable bilaterally  Neuro: Grossly nonfocal  Data Reviewed: Basic Metabolic Panel:  Lab 03/17/12 1610 03/16/12 0356 03/15/12 0402  NA 134* 132* 129*  K 4.4 4.4 4.0  CL 103 102 96  CO2 23 21 27   GLUCOSE 149* 223* 150*  BUN 16 9 7   CREATININE 0.75 0.81 0.83  CALCIUM 8.2*  8.2* 8.2*  MG -- -- --  PHOS -- -- --   CBC:  Lab 03/17/12 0347 03/16/12 0356 03/15/12 0402 03/13/12 0428  WBC 9.2 4.3 4.3 4.4  HGB 13.6 14.2 13.4 13.4  HCT 38.6* 41.1 37.9* 39.0  MCV 91.9 93.0 91.5 93.1  PLT 160 144* 123* 138*   Cardiac Enzymes: No results found for this basename: CKTOTAL:5,CKMB:5,CKMBINDEX:5,TROPONINI:5 in the last 168 hours BNP: No components found with this basename: POCBNP:5 CBG: No results found for this basename: GLUCAP:5 in the last 168 hours  No results found for this or any previous visit (from the past 240 hour(s)).   Studies: Mr Cervical Spine W Contrast 03/15/2012  * IMPRESSION: Motion degraded exam.  Abnormal areas of enhancement consistent with involvement by tumor involving portions of the clivus, occipital condyles and all of the cervical vertebra.  Most prominent involvement is that involving the C2, C3 and C5 vertebral body as well as the right C5 facet and left C7 facet.  No obvious epidural extension of tumor noted on this motion degraded examination nor is there evidence of foraminal extension.  Enlarged left thyroid gland.  Tumor not excluded.  Ultrasound can be obtained for further delineation.  MRI THORACIC SPINE  Findings: Only a significantly motion degraded sagittal post contrast examination of the thoracic spine was able to be obtained secondary to patient discomfort. Tumor involvement of T12 vertebra is better delineated on the lumbar spine MR.  Diffuse tumor involvement extends from the inferior T8 plate through the upper T11 end plate involving a majority of the T9 and T10 vertebral body extending into the posterior elements with mild expansion of the diseased vertebra contributing to the mild spinal stenosis.  Epidural extension of tumor/engorged veins greater on the right extending from T9 to below T12 (most notable epidural tumor at the T12 level).  Diffuse tumor involvement of the T12 vertebral body involving posterior elements with loss of  height centrally inferior aspect. Epidural extension of tumor with crowding of the distal cord / conus.  Foraminal extension of epidural disease more notable T12-L1 then at the T11-T12 level with encroachment upon the exiting T12 nerve roots.  Small areas of tumor involvement T3 through T8 vertebra including T3 and T4 spinous process, left T6 facet and left T8 facet.  IMPRESSION: Only a significantly motion degraded sagittal post contrast examination of the thoracic spine was able to be obtained.  Diffuse tumor involvement extends from the inferior T8 plate through the upper T11 end plate involving a majority of the T9 and T10 vertebral body extending into the posterior elements with mild expansion of the diseased vertebra contributing to the mild spinal stenosis.  Epidural extension of tumor/engorged veins greater on the right extending from T9 to below T12 (most notable epidural tumor at the T12 level).  Diffuse tumor involvement of the T12 vertebral body involving posterior elements with loss of height centrally inferior aspect. Epidural extension of tumor with crowding of the distal cord / conus.  Foraminal extension of epidural disease more notable T12-L1 then at the T11-T12 level with encroachment upon the exiting  T12 nerve roots.  Small areas of tumor involvement T3 through T8 vertebra including T3 and T4 spinous process, left T6 facet and left T8 facet.  MRI LUMBAR SPINE WITHOUT AND WITH CONTRAST  Technique:  Multiplanar and multiecho pulse sequences of the lumbar spine were obtained without and with intravenous contrast.  Contrast: 20mL MULTIHANCE GADOBENATE DIMEGLUMINE 529 MG/ML IV SOLN  Comparison:  CT abdomen and pelvis 03/09/2012.  Findings:  Tumor involvement of all of the visualized vertebra from L1 through the lowest aspect of the sacrum imaged (S3). Tumor involvement of the ilium bilaterally. Most notable tumor involvement L3 vertebral body and right aspect of the sacral ala.  No epidural extension  of tumor in the lumbar-sacral spine.  Conus L1 level.  L1-2:  Right posterior lateral/paracentral protrusion.  Slight crowding of the exiting right L1 nerve root and mild impression upon the right ventral aspect of the thecal sac.  L2-3:  Broad-based disc osteophyte complex greater right lateral position with slight impression upon the exiting right L2 nerve root.  Facet joint degenerative changes.  L3-4:  Baseline broad-based disc osteophyte complex.  Lateral extension touches but does not compress the exiting L3 nerve roots. Small central disc protrusion with minimal indentation upon the thecal sac.  Mild facet joint degenerative changes.  L4-5:  Small to moderate sized broad-based disc protrusion greater to the left.  Left greater than right lateral recess stenosis with mild spinal stenosis.  L5-S1:  Minimal bulge.  IMPRESSION: Tumor involvement of all of the visualized vertebra from L1 through the lowest aspect of the sacrum imaged (S3). Tumor involvement of the ilium bilaterally. Most notable tumor involvement L3 vertebral body and right aspect of the sacral ala.  No epidural extension of tumor in the lumbar-sacral spine.  Lumbar spine degenerative changes as detailed above.    Mr Thoracic Spine W Contrast 03/15/2012  * IMPRESSION: Motion degraded exam.  Abnormal areas of enhancement consistent with involvement by tumor involving portions of the clivus, occipital condyles and all of the cervical vertebra.  Most prominent involvement is that involving the C2, C3 and C5 vertebral body as well as the right C5 facet and left C7 facet.  No obvious epidural extension of tumor noted on this motion degraded examination nor is there evidence of foraminal extension.  Enlarged left thyroid gland.  Tumor not excluded.  Ultrasound can be obtained for further delineation.  MRI THORACIC SPINE  Findings: Only a significantly motion degraded sagittal post contrast examination of the thoracic spine was able to be obtained  secondary to patient discomfort. Tumor involvement of T12 vertebra is better delineated on the lumbar spine MR.  Diffuse tumor involvement extends from the inferior T8 plate through the upper T11 end plate involving a majority of the T9 and T10 vertebral body extending into the posterior elements with mild expansion of the diseased vertebra contributing to the mild spinal stenosis.  Epidural extension of tumor/engorged veins greater on the right extending from T9 to below T12 (most notable epidural tumor at the T12 level).  Diffuse tumor involvement of the T12 vertebral body involving posterior elements with loss of height centrally inferior aspect. Epidural extension of tumor with crowding of the distal cord / conus.  Foraminal extension of epidural disease more notable T12-L1 then at the T11-T12 level with encroachment upon the exiting T12 nerve roots.  Small areas of tumor involvement T3 through T8 vertebra including T3 and T4 spinous process, left T6 facet and left T8 facet.  IMPRESSION: Only  a significantly motion degraded sagittal post contrast examination of the thoracic spine was able to be obtained.  Diffuse tumor involvement extends from the inferior T8 plate through the upper T11 end plate involving a majority of the T9 and T10 vertebral body extending into the posterior elements with mild expansion of the diseased vertebra contributing to the mild spinal stenosis.  Epidural extension of tumor/engorged veins greater on the right extending from T9 to below T12 (most notable epidural tumor at the T12 level).  Diffuse tumor involvement of the T12 vertebral body involving posterior elements with loss of height centrally inferior aspect. Epidural extension of tumor with crowding of the distal cord / conus.  Foraminal extension of epidural disease more notable T12-L1 then at the T11-T12 level with encroachment upon the exiting T12 nerve roots.  Small areas of tumor involvement T3 through T8 vertebra including  T3 and T4 spinous process, left T6 facet and left T8 facet.  MRI LUMBAR SPINE WITHOUT AND WITH CONTRAST  Technique:  Multiplanar and multiecho pulse sequences of the lumbar spine were obtained without and with intravenous contrast.  Contrast: 20mL MULTIHANCE GADOBENATE DIMEGLUMINE 529 MG/ML IV SOLN  Comparison:  CT abdomen and pelvis 03/09/2012.  Findings:  Tumor involvement of all of the visualized vertebra from L1 through the lowest aspect of the sacrum imaged (S3). Tumor involvement of the ilium bilaterally. Most notable tumor involvement L3 vertebral body and right aspect of the sacral ala.  No epidural extension of tumor in the lumbar-sacral spine.  Conus L1 level.  L1-2:  Right posterior lateral/paracentral protrusion.  Slight crowding of the exiting right L1 nerve root and mild impression upon the right ventral aspect of the thecal sac.  L2-3:  Broad-based disc osteophyte complex greater right lateral position with slight impression upon the exiting right L2 nerve root.  Facet joint degenerative changes.  L3-4:  Baseline broad-based disc osteophyte complex.  Lateral extension touches but does not compress the exiting L3 nerve roots. Small central disc protrusion with minimal indentation upon the thecal sac.  Mild facet joint degenerative changes.  L4-5:  Small to moderate sized broad-based disc protrusion greater to the left.  Left greater than right lateral recess stenosis with mild spinal stenosis.  L5-S1:  Minimal bulge.  IMPRESSION: Tumor involvement of all of the visualized vertebra from L1 through the lowest aspect of the sacrum imaged (S3). Tumor involvement of the ilium bilaterally. Most notable tumor involvement L3 vertebral body and right aspect of the sacral ala.  No epidural extension of tumor in the lumbar-sacral spine.  Lumbar spine degenerative changes as detailed above.     Mr Lumbar Spine W Wo Contrast 03/15/2012  *IMPRESSION: Motion degraded exam.  Abnormal areas of enhancement consistent  with involvement by tumor involving portions of the clivus, occipital condyles and all of the cervical vertebra.  Most prominent involvement is that involving the C2, C3 and C5 vertebral body as well as the right C5 facet and left C7 facet.  No obvious epidural extension of tumor noted on this motion degraded examination nor is there evidence of foraminal extension.  Enlarged left thyroid gland.  Tumor not excluded.  Ultrasound can be obtained for further delineation.  MRI THORACIC SPINE  Findings: Only a significantly motion degraded sagittal post contrast examination of the thoracic spine was able to be obtained secondary to patient discomfort. Tumor involvement of T12 vertebra is better delineated on the lumbar spine MR.  Diffuse tumor involvement extends from the inferior T8 plate through  the upper T11 end plate involving a majority of the T9 and T10 vertebral body extending into the posterior elements with mild expansion of the diseased vertebra contributing to the mild spinal stenosis.  Epidural extension of tumor/engorged veins greater on the right extending from T9 to below T12 (most notable epidural tumor at the T12 level).  Diffuse tumor involvement of the T12 vertebral body involving posterior elements with loss of height centrally inferior aspect. Epidural extension of tumor with crowding of the distal cord / conus.  Foraminal extension of epidural disease more notable T12-L1 then at the T11-T12 level with encroachment upon the exiting T12 nerve roots.  Small areas of tumor involvement T3 through T8 vertebra including T3 and T4 spinous process, left T6 facet and left T8 facet.  IMPRESSION: Only a significantly motion degraded sagittal post contrast examination of the thoracic spine was able to be obtained.  Diffuse tumor involvement extends from the inferior T8 plate through the upper T11 end plate involving a majority of the T9 and T10 vertebral body extending into the posterior elements with mild  expansion of the diseased vertebra contributing to the mild spinal stenosis.  Epidural extension of tumor/engorged veins greater on the right extending from T9 to below T12 (most notable epidural tumor at the T12 level).  Diffuse tumor involvement of the T12 vertebral body involving posterior elements with loss of height centrally inferior aspect. Epidural extension of tumor with crowding of the distal cord / conus.  Foraminal extension of epidural disease more notable T12-L1 then at the T11-T12 level with encroachment upon the exiting T12 nerve roots.  Small areas of tumor involvement T3 through T8 vertebra including T3 and T4 spinous process, left T6 facet and left T8 facet.  MRI LUMBAR SPINE WITHOUT AND WITH CONTRAST  Technique:  Multiplanar and multiecho pulse sequences of the lumbar spine were obtained without and with intravenous contrast.  Contrast: 20mL MULTIHANCE GADOBENATE DIMEGLUMINE 529 MG/ML IV SOLN  Comparison:  CT abdomen and pelvis 03/09/2012.  Findings:  Tumor involvement of all of the visualized vertebra from L1 through the lowest aspect of the sacrum imaged (S3). Tumor involvement of the ilium bilaterally. Most notable tumor involvement L3 vertebral body and right aspect of the sacral ala.  No epidural extension of tumor in the lumbar-sacral spine.  Conus L1 level.  L1-2:  Right posterior lateral/paracentral protrusion.  Slight crowding of the exiting right L1 nerve root and mild impression upon the right ventral aspect of the thecal sac.  L2-3:  Broad-based disc osteophyte complex greater right lateral position with slight impression upon the exiting right L2 nerve root.  Facet joint degenerative changes.  L3-4:  Baseline broad-based disc osteophyte complex.  Lateral extension touches but does not compress the exiting L3 nerve roots. Small central disc protrusion with minimal indentation upon the thecal sac.  Mild facet joint degenerative changes.  L4-5:  Small to moderate sized broad-based disc  protrusion greater to the left.  Left greater than right lateral recess stenosis with mild spinal stenosis.  L5-S1:  Minimal bulge.  IMPRESSION: Tumor involvement of all of the visualized vertebra from L1 through the lowest aspect of the sacrum imaged (S3). Tumor involvement of the ilium bilaterally. Most notable tumor involvement L3 vertebral body and right aspect of the sacral ala.  No epidural extension of tumor in the lumbar-sacral spine.  Lumbar spine degenerative changes as detailed above.    Ct Biopsy 03/15/2012  * IMPRESSION:  Successful CT guided left iliac bone marrow  aspiration and core biopsy.    Dg Bone Survey Met 03/15/2012  *  IMPRESSION: Multiple lesions of multiple myeloma including destruction of the T12 vertebral body.  Lytic lesions elsewhere in the thoracic spine as described and as reported on the thoracic MRI.     Scheduled Meds:   . baclofen  5 mg Oral TID  . dexamethasone  4 mg Oral Q6H  . docusate sodium  100 mg Oral BID  . feeding supplement  237 mL Oral BID BM  . LORazepam  1 mg Intravenous Once  . oxyCODONE  20 mg Oral Q12H  . pantoprazole  40 mg Oral Q1200   Continuous Infusions:   . sodium chloride 50 mL/hr at 03/16/12 2136

## 2012-03-17 NOTE — Progress Notes (Signed)
OT Cancellation Note  Treatment cancelled today due to medical issues with patient which prohibited therapy. Results of Tests reviewed and note that pt is under consideration for interventional radiology. Will discontinue OT attempts at this time due to uncontrolled pain and spinal instability. Please reorder OT when pt will be ready to participate.   Khalil Belote A OTR/L [phone] 03/17/2012, 7:56 AM

## 2012-03-17 NOTE — Progress Notes (Signed)
BP 134/83  Pulse 82  Temp 98.8 F (37.1 C) (Oral)  Resp 18  Ht 5\' 8"  (1.727 m)  Wt 250 lb (113.399 kg)  BMI 38.01 kg/m2  SpO2 99% Pt feels ok. Had good extensive conversation with Dr. Gaylyn Rong yesterday regarding diagnosis, prognosis, and all treatments involved. He has a much better grasp on the overall picture. He is very much agreeable to move forward with co-ablation procedure followed by palliative radiation therapy. Our next earliest available date/time to do ablation/vertebroplasty procedure of T9-T12 with general anesthesia is next week Tues, 8/13 and he is on our schedule. We rediscussed the procedure including risks, complications, and expectations for recovery. Pain control until then and we will follow along in preparation for Tues. Will report to Dr. Corliss Skains.  Brayton El PA-C 03/17/2012 9:34 AM

## 2012-03-18 MED ORDER — POLYETHYLENE GLYCOL 3350 17 G PO PACK
17.0000 g | PACK | Freq: Every day | ORAL | Status: DC
  Administered 2012-03-18 – 2012-03-23 (×4): 17 g via ORAL
  Filled 2012-03-18 (×7): qty 1

## 2012-03-18 MED ORDER — BISACODYL 5 MG PO TBEC: 10.0000 mg | DELAYED_RELEASE_TABLET | Freq: Every day | ORAL | Status: DC | PRN

## 2012-03-18 NOTE — Progress Notes (Addendum)
[facility] PROGRESS NOTE  Robert Berger RUE:[id number] DOB: 02-23-1941 DOA: 03/09/2012 PCP: No primary provider on file.  Brief narrative:  71 year old man who presented to the hospital on 03/09/12 with a 1 week history of abdominal pain, back pain and constipation. CT scan findings showed expansile lytic lesions involving thoracic and lumbar spine The patient underwent CT guided biopsy of T 12 lesion with pathology finding consistent with plasma cell disorder. Diagnostic bone marrow biopsy subsequently done with preliminary finding of 11% plasma cells. Bone marrow aspirate for cytogenics and myeloma FISH panel are still pending.  Plan pr radiology is for ablation/vertebroplasty procedure of T9-T12 with general anesthesia is 03/21/2012   Assessment/Plan:   Principal Problem:  *Back pain secondary to IgG kappa multiple myeloma  needs local therapy to take care of his pain before chemotherapy which includes kyphoplasty and palliative radiation (after kyphoplasty)  I appreciate very much a wonderful explanation by an oncologist in regards to multiple myeloma treatment plan which is dependant on cytogenetics results and FISH panel  patient would benefit from Zometa to prevent skeletal related events but prior to that he would need dental evaluation as zometa can cause osteonecrosis of the jaw and this patient already has a poor dentition  Will continue dexamethasone IV 4 mg Q 4 hours; monitor CBG at least twice daily  Current pain regimen: Oxycontin 20 mg BID PO scheduled and dilaudid 1 mg Q 4 hours IV PRN and Roxicodone 5 mg Q 4 hours PRN  PT evaluation - although appropriate may not be done at this time due to back pain with evan small strains  Active Problems:  Thyromegaly, probable goiter  TSH WNL.  Thyroid ultrasound done 03/11/12, shows multiple nodules  status post biopsy[- pending pathology Hiatal hernia and fluid filled esophagus / GERD  Continue Protonix daily  Abdominal pain    Likely radiating pain from back related to multiple myeloma  Will add miralax to current bowel regimen  Code Status: Full  Family Communication: no family at bedside  Disposition Plan: PT evaluation - pending   Medical Consultants:  Dr. Arlan Organ, Oncology.  Other Consultants:  Physical therapy  Occupational therapy: 3:1 commode recommended. Procedures:  Plan 03/21/2012 ablation/vertebroplasty procedure of T9-T12 with general anesthesia Antibiotics:  None.  [name], MD  [facility] Pager [phone]  If 7PM-7AM, please contact night-coverage [email or web address] Password [facility] 03/18/2012, 8:54 AM   LOS: 9 days   HPI/Subjective: Has back pain this morning but was relieved with dilaudid.   Objective: Filed Vitals:   03/17/12 0606 03/17/12 1414 03/17/12 2046 03/18/12 0534  BP: 134/83 155/88 142/80 145/80  Pulse: 82 86 78 78  Temp: 98.8 F (37.1 C) 98.2 F (36.8 C) 97.3 F (36.3 C) 97.8 F (36.6 C)  TempSrc: Oral  Oral Oral  Resp: 18 16 18 18   Height:      Weight:      SpO2: 99% 96% 97% 93%    Intake/Output Summary (Last 24 hours) at 03/18/12 0854 Last data filed at 03/18/12 0413  Gross per 24 hour  Intake   1560 ml  Output   2260 ml  Net   -700 ml    Exam:   General:  Pt is alert, follows commands appropriately, not in acute distress  Cardiovascular: Regular rate and rhythm, S1/S2, no murmurs, no rubs, no gallops  Respiratory: Clear to auscultation bilaterally, no wheezing, no crackles, no rhonchi  Abdomen: Soft, non tender, non distended, bowel sounds present, no guarding  Extremities: No edema, pulses DP and PT palpable bilaterally  Neuro: Grossly nonfocal  Data Reviewed: Basic Metabolic Panel:  Lab 03/17/12 1610 03/16/12 0356 03/15/12 0402  NA 134* 132* 129*  K 4.4 4.4 4.0  CL 103 102 96  CO2 23 21 27   GLUCOSE 149* 223* 150*  BUN 16 9 7   CREATININE 0.75 0.81 0.83  CALCIUM 8.2* 8.2* 8.2*  MG -- -- --  PHOS -- -- --    Liver Function Tests: No results found for this basename: AST:5,ALT:5,ALKPHOS:5,BILITOT:5,PROT:5,ALBUMIN:5 in the last 168 hours No results found for this basename: LIPASE:5,AMYLASE:5 in the last 168 hours No results found for this basename: AMMONIA:5 in the last 168 hours CBC:  Lab 03/17/12 0347 03/16/12 0356 03/15/12 0402 03/13/12 0428  WBC 9.2 4.3 4.3 4.4  NEUTROABS -- -- -- --  HGB 13.6 14.2 13.4 13.4  HCT 38.6* 41.1 37.9* 39.0  MCV 91.9 93.0 91.5 93.1  PLT 160 144* 123* 138*   Cardiac Enzymes: No results found for this basename: CKTOTAL:5,CKMB:5,CKMBINDEX:5,TROPONINI:5 in the last 168 hours BNP: No components found with this basename: POCBNP:5 CBG: No results found for this basename: GLUCAP:5 in the last 168 hours  No results found for this or any previous visit (from the past 240 hour(s)).   Studies: No results found.  Scheduled Meds:   . baclofen  5 mg Oral TID  . dexamethasone  4 mg Oral Q6H  . docusate sodium  100 mg Oral BID  . feeding supplement  237 mL Oral BID BM  . LORazepam  1 mg Intravenous Once  . oxyCODONE  20 mg Oral Q12H  . pantoprazole  40 mg Oral Q1200   Continuous Infusions:   . sodium chloride 50 mL/hr at 03/17/12 1733

## 2012-03-19 NOTE — Progress Notes (Signed)
Pt. States if he lays flat his pain is #4 and if he moves or bends it is a sharp pain that goes up to 8-9.Marland Kitchen  Rolls side to side in bed.

## 2012-03-19 NOTE — Plan of Care (Signed)
Problem: Phase II Progression Outcomes Goal: Progress activity as tolerated unless otherwise ordered Outcome: Progressing Ambulating with assist

## 2012-03-19 NOTE — Progress Notes (Addendum)
Patient ID: Robert Berger, male   DOB: [birth year], 70 y.o.   MRN: [id number] [facility] PROGRESS NOTE  Robert Berger BJY:[id number] DOB: 1940/09/06 DOA: 03/09/2012 PCP: No primary provider on file.  Brief narrative: 71 year old man who presented to the hospital on 03/09/12 with a 1 week history of abdominal pain, back pain and constipation. CT scan findings showed expansile lytic lesions involving thoracic and lumbar spine The patient underwent CT guided biopsy of T 12 lesion with pathology finding consistent with plasma cell disorder. Diagnostic bone marrow biopsy subsequently done with preliminary finding of 11% plasma cells. Bone marrow aspirate for cytogenics and myeloma FISH panel are still pending.  Plan pr radiology is for ablation/vertebroplasty procedure of T9-T12 with general anesthesia is 03/21/2012   Assessment/Plan:  Principal Problem:  *Back pain secondary to IgG kappa multiple myeloma  Patient needs local therapy to take care of his pain before chemotherapy which includes kyphoplasty and palliative radiation (after kyphoplasty)  Appreciated very much a wonderful explanation by an oncologist in regards to multiple myeloma treatment plan which is dependant on cytogenetics results and FISH panel  After dental evaluation will start Zometa to prevent skeletal related events but prior to that he would need dental evaluation as zometa can cause osteonecrosis of the jaw and this patient already has a poor dentition  Will continue dexamethasone IV 4 mg Q 4 hours; monitor CBG at least twice daily  Current pain regimen: Oxycontin 20 mg BID PO scheduled and dilaudid 1 mg Q 4 hours IV PRN and Roxicodone 5 mg Q 4 hours PRN  PT evaluation - although appropriate may not be done at this time due to back pain with evan small strains  Active Problems:  Thyromegaly, probable goiter  TSH WNL.  Thyroid ultrasound done 03/11/12, shows multiple nodules  status post biopsy[- non neoplastic  goiter Hiatal hernia and fluid filled esophagus / GERD  Continue Protonix daily  Abdominal pain  Secondary to radiating pain from back related to multiple myeloma  Patient had BM today; continue current bowel regimen  Code Status: Full  Family Communication: no family at bedside  Disposition Plan: PT evaluation - pending   Medical Consultants:  Dr. Arlan Organ, Oncology.  Other Consultants:  Physical therapy  Occupational therapy: 3:1 commode recommended. Procedures:  Plan 03/21/2012 ablation/vertebroplasty procedure of T9-T12 with general anesthesia Antibiotics:  None.  [name], MD  [facility] Pager [phone]  If 7PM-7AM, please contact night-coverage [email or web address] Password [facility] 03/19/2012, 6:49 AM   LOS: 10 days   HPI/Subjective: No acute events overnight.  Objective: Filed Vitals:   03/18/12 0534 03/18/12 1443 03/18/12 2105 03/19/12 0507  BP: 145/80 146/90 134/79 152/81  Pulse: 78 79 70 91  Temp: 97.8 F (36.6 C) 98.2 F (36.8 C) 98.2 F (36.8 C) 98.8 F (37.1 C)  TempSrc: Oral Oral Oral Oral  Resp: 18 18 20 18   Height:      Weight:      SpO2: 93% 95% 93% 96%    Intake/Output Summary (Last 24 hours) at 03/19/12 0649 Last data filed at 03/19/12 0600  Gross per 24 hour  Intake   4317 ml  Output   2350 ml  Net   1967 ml    Exam:   General:  Pt is alert, follows commands appropriately, not in acute distress  Cardiovascular: Regular rate and rhythm, S1/S2, no murmurs, no rubs, no gallops  Respiratory: Clear to auscultation bilaterally, no wheezing, no crackles, no rhonchi  Abdomen: Soft,  non tender, non distended, bowel sounds present, no guarding  Extremities: No edema, pulses DP and PT palpable bilaterally  Neuro: Grossly nonfocal  Data Reviewed: Basic Metabolic Panel:  Lab 03/17/12 1610 03/16/12 0356 03/15/12 0402  NA 134* 132* 129*  K 4.4 4.4 4.0  CL 103 102 96  CO2 23 21 27   GLUCOSE 149* 223* 150*  BUN 16 9 7    CREATININE 0.75 0.81 0.83  CALCIUM 8.2* 8.2* 8.2*   CBC:  Lab 03/17/12 0347 03/16/12 0356 03/15/12 0402 03/13/12 0428  WBC 9.2 4.3 4.3 4.4  NEUTROABS -- -- -- --  HGB 13.6 14.2 13.4 13.4  HCT 38.6* 41.1 37.9* 39.0  MCV 91.9 93.0 91.5 93.1  PLT 160 144* 123* 138*    Studies: No results found.  Scheduled Meds:   . baclofen  5 mg Oral TID  . dexamethasone  4 mg Oral Q6H  . docusate sodium  100 mg Oral BID  . feeding supplement  237 mL Oral BID BM  . LORazepam  1 mg Intravenous Once  . oxyCODONE  20 mg Oral Q12H  . pantoprazole  40 mg Oral Q1200  . polyethylene glycol  17 g Oral Daily   Continuous Infusions:   . sodium chloride 50 mL/hr at 03/19/12 [phone]

## 2012-03-19 NOTE — Plan of Care (Signed)
Problem: Phase III Progression Outcomes Goal: Voiding independently Outcome: Progressing Using urinal     

## 2012-03-20 ENCOUNTER — Encounter (HOSPITAL_COMMUNITY): Payer: Self-pay | Admitting: *Deleted

## 2012-03-20 DIAGNOSIS — M899 Disorder of bone, unspecified: Secondary | ICD-10-CM

## 2012-03-20 DIAGNOSIS — C9000 Multiple myeloma not having achieved remission: Principal | ICD-10-CM

## 2012-03-20 DIAGNOSIS — M949 Disorder of cartilage, unspecified: Secondary | ICD-10-CM

## 2012-03-20 LAB — BASIC METABOLIC PANEL
Calcium: 8 mg/dL — ABNORMAL LOW (ref 8.4–10.5)
Chloride: 102 mEq/L (ref 96–112)
Creatinine, Ser: 0.72 mg/dL (ref 0.50–1.35)
GFR calc Af Amer: >90 mL/min (ref >90)

## 2012-03-20 LAB — GLUCOSE, CAPILLARY
Glucose-Capillary: 162 mg/dL — ABNORMAL HIGH (ref 70–99)
Glucose-Capillary: 177 mg/dL — ABNORMAL HIGH (ref 70–99)

## 2012-03-20 LAB — CBC
MCV: 91.4 fL (ref 78.0–100.0)
Platelets: 215 10*3/uL (ref 150–400)
RDW: 13.9 % (ref 11.5–15.5)
WBC: 10.5 10*3/uL (ref 4.0–10.5)

## 2012-03-20 MED ORDER — INSULIN ASPART 100 UNIT/ML ~~LOC~~ SOLN
0.0000 [IU] | Freq: Three times a day (TID) | SUBCUTANEOUS | Status: DC
  Administered 2012-03-21 – 2012-03-22 (×4): 2 [IU] via SUBCUTANEOUS
  Administered 2012-03-23: 8 [IU] via SUBCUTANEOUS
  Administered 2012-03-23 (×2): 2 [IU] via SUBCUTANEOUS
  Administered 2012-03-24: 3 [IU] via SUBCUTANEOUS
  Administered 2012-03-24: 2 [IU] via SUBCUTANEOUS

## 2012-03-20 MED ORDER — SODIUM CHLORIDE 0.9 % IV SOLN
1500.0000 mg | INTRAVENOUS | Status: AC
  Administered 2012-03-21: 1500 mg via INTRAVENOUS
  Filled 2012-03-20: qty 1500

## 2012-03-20 MED ORDER — AMLODIPINE BESYLATE 5 MG PO TABS
5.0000 mg | ORAL_TABLET | Freq: Every day | ORAL | Status: DC
  Administered 2012-03-20 – 2012-03-24 (×4): 5 mg via ORAL
  Filled 2012-03-20 (×5): qty 1

## 2012-03-20 MED ORDER — INSULIN ASPART 100 UNIT/ML ~~LOC~~ SOLN
0.0000 [IU] | Freq: Every day | SUBCUTANEOUS | Status: DC
  Administered 2012-03-20 – 2012-03-23 (×3): 2 [IU] via SUBCUTANEOUS

## 2012-03-20 NOTE — Progress Notes (Signed)
Subjective: Pt doing fairly well today; no sig back  /flank pain as long as recumbent but during movement pain exacerbated, primarily in bilateral flank regions  Objective: Vital signs in last 24 hours: Temp:  [97.7 F (36.5 C)-98.4 F (36.9 C)] 98.3 F (36.8 C) (08/12 0525) Pulse Rate:  [67-80] 67  (08/12 0525) Resp:  [18-20] 20  (08/12 0525) BP: (143-151)/(83-92) 150/92 mmHg (08/12 0525) SpO2:  [96 %-98 %] 97 % (08/12 0525) Last BM Date: 03/19/12  Intake/Output from previous day: 08/11 0701 - 08/12 0700 In: 2394 [P.O.:1794; I.V.:600] Out: 1975 [Urine:1975] Intake/Output this shift: Total I/O In: -  Out: 401 [Urine:400; Stool:1]  Pt awake/alert;chest- CTA bilat, heart-RRR, abd - obese, soft, +BS, tender lat abd regions bilat, ext with FROM, sens fxn intact, no sig edema  Lab Results:  No results found for this basename: WBC:2,HGB:2,HCT:2,PLT:2 in the last 72 hours BMET No results found for this basename: NA:2,K:2,CL:2,CO2:2,GLUCOSE:2,BUN:2,CREATININE:2,CALCIUM:2 in the last 72 hours PT/INR No results found for this basename: LABPROT:2,INR:2 in the last 72 hours ABG No results found for this basename: PHART:2,PCO2:2,PO2:2,HCO3:2 in the last 72 hours Results for orders placed during the hospital encounter of 03/09/12  CBC WITH DIFFERENTIAL      Component Value Range   WBC 5.9  4.0 - 10.5 K/uL   RBC 4.52  4.22 - 5.81 MIL/uL   Hemoglobin 14.8  13.0 - 17.0 g/dL   HCT 08.6  57.8 - 46.9 %   MCV 89.8  78.0 - 100.0 fL   MCH 32.7  26.0 - 34.0 pg   MCHC 36.5 (*) 30.0 - 36.0 g/dL   RDW 62.9  52.8 - 41.3 %   Platelets 135 (*) 150 - 400 K/uL   Neutrophils Relative 62  43 - 77 %   Neutro Abs 3.7  1.7 - 7.7 K/uL   Lymphocytes Relative 24  12 - 46 %   Lymphs Abs 1.4  0.7 - 4.0 K/uL   Monocytes Relative 11  3 - 12 %   Monocytes Absolute 0.6  0.1 - 1.0 K/uL   Eosinophils Relative 2  0 - 5 %   Eosinophils Absolute 0.1  0.0 - 0.7 K/uL   Basophils Relative 1  0 - 1 %   Basophils  Absolute 0.0  0.0 - 0.1 K/uL  COMPREHENSIVE METABOLIC PANEL      Component Value Range   Sodium 135  135 - 145 mEq/L   Potassium 3.8  3.5 - 5.1 mEq/L   Chloride 102  96 - 112 mEq/L   CO2 24  19 - 32 mEq/L   Glucose, Bld 111 (*) 70 - 99 mg/dL   BUN 13  6 - 23 mg/dL   Creatinine, Ser 2.44  0.50 - 1.35 mg/dL   Calcium 01.0  8.4 - 27.2 mg/dL   Total Protein 9.2 (*) 6.0 - 8.3 g/dL   Albumin 3.9  3.5 - 5.2 g/dL   AST 38 (*) 0 - 37 U/L   ALT 51  0 - 53 U/L   Alkaline Phosphatase 77  39 - 117 U/L   Total Bilirubin 1.2  0.3 - 1.2 mg/dL   GFR calc non Af Amer 66 (*) >90 mL/min   GFR calc Af Amer 76 (*) >90 mL/min  CARDIAC PANEL(CRET KIN+CKTOT+MB+TROPI)      Component Value Range   Total CK 80  7 - 232 U/L   CK, MB 1.9  0.3 - 4.0 ng/mL   Troponin I <0.30  <0.30 ng/mL  Relative Index RELATIVE INDEX IS INVALID  0.0 - 2.5  LACTIC ACID, PLASMA      Component Value Range   Lactic Acid, Venous 1.3  0.5 - 2.2 mmol/L  URINALYSIS, ROUTINE W REFLEX MICROSCOPIC      Component Value Range   Color, Urine YELLOW  YELLOW   APPearance CLEAR  CLEAR   Specific Gravity, Urine 1.036 (*) 1.005 - 1.030   pH 7.0  5.0 - 8.0   Glucose, UA NEGATIVE  NEGATIVE mg/dL   Hgb urine dipstick NEGATIVE  NEGATIVE   Bilirubin Urine NEGATIVE  NEGATIVE   Ketones, ur 15 (*) NEGATIVE mg/dL   Protein, ur NEGATIVE  NEGATIVE mg/dL   Urobilinogen, UA 0.2  0.0 - 1.0 mg/dL   Nitrite NEGATIVE  NEGATIVE   Leukocytes, UA NEGATIVE  NEGATIVE  LIPASE, BLOOD      Component Value Range   Lipase 28  11 - 59 U/L  OCCULT BLOOD X 1 CARD TO LAB, STOOL      Component Value Range   Fecal Occult Bld NEGATIVE    CBC      Component Value Range   WBC 5.8  4.0 - 10.5 K/uL   RBC 4.42  4.22 - 5.81 MIL/uL   Hemoglobin 14.3  13.0 - 17.0 g/dL   HCT 16.1  09.6 - 04.5 %   MCV 91.2  78.0 - 100.0 fL   MCH 32.4  26.0 - 34.0 pg   MCHC 35.5  30.0 - 36.0 g/dL   RDW 40.9  81.1 - 91.4 %   Platelets 133 (*) 150 - 400 K/uL  CREATININE, SERUM       Component Value Range   Creatinine, Ser 0.88  0.50 - 1.35 mg/dL   GFR calc non Af Amer 84 (*) >90 mL/min   GFR calc Af Amer >90  >90 mL/min  IMMUNOFIXATION ELECTROPHORESIS, URINE (WITH TOT PROT)      Component Value Range   Time RANDOM     Volume, Urine RANDOM     Total Protein, Urine 5.3     Total Protein, Urine-Ur/day NOT CALC  10 - 140 mg/day   Albumin, U DETECTED  DETECTED   Alpha 1, Urine DETECTED (*) NONE DETECTED   Alpha 2, Urine DETECTED (*) NONE DETECTED   Beta, Urine DETECTED (*) NONE DETECTED   Gamma Globulin, Urine DETECTED (*) NONE DETECTED   Free Kappa Lt Chains,Ur 4.13 (*) 0.14 - 2.42 mg/dL   Free Lt Chn Excr Rate NOT CALC     Free Lambda Lt Chains,Ur 0.28  0.02 - 0.67 mg/dL   Free Lambda Excretion/Day NOT CALC     Free Kappa/Lambda Ratio 14.75 (*) 2.04 - 10.37 ratio   Immunofixation, Urine (NOTE)    PSA      Component Value Range   PSA 3.89  <=4.00 ng/mL  FREE PSA      Component Value Range   PSA, Free 0.69     PSA, Free Pct 18 (*) >25 %  BETA 2 MICROGLOBULINE, SERUM      Component Value Range   Beta-2 Microglobulin 2.34 (*) 1.01 - 1.73 mg/L  KAPPA/LAMBDA LIGHT CHAINS      Component Value Range   Kappa free light chain 1.60  0.33 - 1.94 mg/dL   Lamda free light chains 0.26 (*) 0.57 - 2.63 mg/dL   Kappa, lamda light chain ratio 6.15 (*) 0.26 - 1.65  PROTEIN ELECTROPHORESIS, SERUM      Component Value Range  Total Protein ELP 9.0 (*) 6.0 - 8.3 g/dL   Albumin ELP 09.8 (*) 55.8 - 66.1 %   Alpha-1-Globulin 3.9  2.9 - 4.9 %   Alpha-2-Globulin 12.4 (*) 7.1 - 11.8 %   Beta Globulin 4.2 (*) 4.7 - 7.2 %   Beta 2 3.2  3.2 - 6.5 %   Gamma Globulin 29.9 (*) 11.1 - 18.8 %   M-Spike, % 2.42     SPE Interp. (NOTE)     Comment (NOTE)    BASIC METABOLIC PANEL      Component Value Range   Sodium 132 (*) 135 - 145 mEq/L   Potassium 3.9  3.5 - 5.1 mEq/L   Chloride 98  96 - 112 mEq/L   CO2 27  19 - 32 mEq/L   Glucose, Bld 103 (*) 70 - 99 mg/dL   BUN 10  6 - 23 mg/dL     Creatinine, Ser 1.19  0.50 - 1.35 mg/dL   Calcium 8.7  8.4 - 14.7 mg/dL   GFR calc non Af Amer 88 (*) >90 mL/min   GFR calc Af Amer >90  >90 mL/min  TSH      Component Value Range   TSH 0.815  0.350 - 4.500 uIU/mL  LACTATE DEHYDROGENASE      Component Value Range   LDH 205  94 - 250 U/L  PROTEIN ELECTROPH W RFLX QUANT IMMUNOGLOBULINS      Component Value Range   Total Protein ELP 7.4  6.0 - 8.3 g/dL   Albumin ELP 82.9 (*) 55.8 - 66.1 %   Alpha-1-Globulin 4.4  2.9 - 4.9 %   Alpha-2-Globulin 11.6  7.1 - 11.8 %   Beta Globulin 5.3  4.7 - 7.2 %   Beta 2 2.3 (*) 3.2 - 6.5 %   Gamma Globulin 29.6 (*) 11.1 - 18.8 %   M-Spike, % 1.95     SPE Interp. (NOTE)     Comment (NOTE)    CBC      Component Value Range   WBC 4.4  4.0 - 10.5 K/uL   RBC 4.19 (*) 4.22 - 5.81 MIL/uL   Hemoglobin 13.4  13.0 - 17.0 g/dL   HCT 56.2  13.0 - 86.5 %   MCV 93.1  78.0 - 100.0 fL   MCH 32.0  26.0 - 34.0 pg   MCHC 34.4  30.0 - 36.0 g/dL   RDW 78.4  69.6 - 29.5 %   Platelets 138 (*) 150 - 400 K/uL  PROTIME-INR      Component Value Range   Prothrombin Time 12.7  11.6 - 15.2 seconds   INR 0.93  0.00 - 1.49  APTT      Component Value Range   aPTT 31  24 - 37 seconds  IMMUNOFIXATION ELECTROPHORESIS      Component Value Range   Total Protein ELP 7.0  6.0 - 8.3 g/dL   IgG (Immunoglobin G), Serum 2760 (*) 650 - 1600 mg/dL   IgA 43 (*) 68 - 284 mg/dL   IgM, Serum 37 (*) 41 - 251 mg/dL   Immunofix Electr Int (NOTE)    IMMUNOFIXATION ADD-ON      Component Value Range   Immunofix Electr Int (NOTE)    IGG, IGA, IGM      Component Value Range   IgG (Immunoglobin G), Serum 3010 (*) 650 - 1600 mg/dL   IgA 50 (*) 68 - 132 mg/dL   IgM, Serum 40 (*) 41 - 251 mg/dL  BASIC METABOLIC PANEL      Component Value Range   Sodium 129 (*) 135 - 145 mEq/L   Potassium 4.0  3.5 - 5.1 mEq/L   Chloride 96  96 - 112 mEq/L   CO2 27  19 - 32 mEq/L   Glucose, Bld 150 (*) 70 - 99 mg/dL   BUN 7  6 - 23 mg/dL   Creatinine,  Ser 0.86  0.50 - 1.35 mg/dL   Calcium 8.2 (*) 8.4 - 10.5 mg/dL   GFR calc non Af Amer 86 (*) >90 mL/min   GFR calc Af Amer >90  >90 mL/min  CBC      Component Value Range   WBC 4.3  4.0 - 10.5 K/uL   RBC 4.14 (*) 4.22 - 5.81 MIL/uL   Hemoglobin 13.4  13.0 - 17.0 g/dL   HCT 57.8 (*) 46.9 - 62.9 %   MCV 91.5  78.0 - 100.0 fL   MCH 32.4  26.0 - 34.0 pg   MCHC 35.4  30.0 - 36.0 g/dL   RDW 52.8  41.3 - 24.4 %   Platelets 123 (*) 150 - 400 K/uL  CBC      Component Value Range   WBC 4.3  4.0 - 10.5 K/uL   RBC 4.42  4.22 - 5.81 MIL/uL   Hemoglobin 14.2  13.0 - 17.0 g/dL   HCT 01.0  27.2 - 53.6 %   MCV 93.0  78.0 - 100.0 fL   MCH 32.1  26.0 - 34.0 pg   MCHC 34.5  30.0 - 36.0 g/dL   RDW 64.4  03.4 - 74.2 %   Platelets 144 (*) 150 - 400 K/uL  BASIC METABOLIC PANEL      Component Value Range   Sodium 132 (*) 135 - 145 mEq/L   Potassium 4.4  3.5 - 5.1 mEq/L   Chloride 102  96 - 112 mEq/L   CO2 21  19 - 32 mEq/L   Glucose, Bld 223 (*) 70 - 99 mg/dL   BUN 9  6 - 23 mg/dL   Creatinine, Ser 5.95  0.50 - 1.35 mg/dL   Calcium 8.2 (*) 8.4 - 10.5 mg/dL   GFR calc non Af Amer 87 (*) >90 mL/min   GFR calc Af Amer >90  >90 mL/min  CBC      Component Value Range   WBC 9.2  4.0 - 10.5 K/uL   RBC 4.20 (*) 4.22 - 5.81 MIL/uL   Hemoglobin 13.6  13.0 - 17.0 g/dL   HCT 63.8 (*) 75.6 - 43.3 %   MCV 91.9  78.0 - 100.0 fL   MCH 32.4  26.0 - 34.0 pg   MCHC 35.2  30.0 - 36.0 g/dL   RDW 29.5  18.8 - 41.6 %   Platelets 160  150 - 400 K/uL  BASIC METABOLIC PANEL      Component Value Range   Sodium 134 (*) 135 - 145 mEq/L   Potassium 4.4  3.5 - 5.1 mEq/L   Chloride 103  96 - 112 mEq/L   CO2 23  19 - 32 mEq/L   Glucose, Bld 149 (*) 70 - 99 mg/dL   BUN 16  6 - 23 mg/dL   Creatinine, Ser 6.06  0.50 - 1.35 mg/dL   Calcium 8.2 (*) 8.4 - 10.5 mg/dL   GFR calc non Af Amer >90  >90 mL/min   GFR calc Af Amer >90  >90 mL/min   X-ray Chest Pa And  Lateral   03/09/2012  *RADIOLOGY REPORT*  Clinical Data: No  chest complaints.  Osseous lytic lesions on CT of the abdomen.  CHEST - 2 VIEW  Comparison: Abdominal pelvic CT of earlier today.  Findings: Mild osteopenia.  Maintenance of thoracic vertebral body height.  Suspect ankylosing spondylitis or diffuse idiopathic skeletal hyperostosis involving the thoracic spine.  Low-volume AP frontal view.  Patient minimally rotated to the right. Normal heart size.  Normal mediastinal contours. No pleural effusion or pneumothorax.  Mildly low lung volumes. Clear lungs.  No other focal osseous lytic lesions are identified.  IMPRESSION: Low lung volumes without acute disease.  Original Report Authenticated By: Consuello Bossier, M.D.   Ct Chest W Contrast  03/10/2012  *RADIOLOGY REPORT*  Clinical Data: Lung cancer.  Lytic bone lesion.  Evaluate for primary.  CT CHEST WITH CONTRAST  Technique:  Multidetector CT imaging of the chest was performed following the standard protocol during bolus administration of intravenous contrast.  Contrast: 80mL OMNIPAQUE IOHEXOL 300 MG/ML  SOLN  Comparison: CT abdomen and bone scan.  Findings: Enlargement of the left thyroid lobe is present, incompletely visualized.  This probably represents thyroid goiter. Coarse calcifications are present in the enlarged left thyroid lobe.  There is no axillary adenopathy.  No mediastinal or hilar adenopathy. Coronary artery atherosclerosis is present. If office based assessment of coronary risk factors has not been performed, it is now recommended.  Heart grossly appears within normal limits otherwise.  No pericardial effusion.  No pleural effusion.  Dependent atelectasis in the left lung.  Medial right upper lobe atelectasis or scarring with collapse along the right vertebral column.  Incidental imaging of the abdomen appears similar to the recent prior.  Fluid distends the esophagus.  Small hiatal hernia. Small calcified granuloma is present in the lateral right lower lobe (image 36 series 5).  There is a lytic  lesion in the right sternal manubrium (image 11 series 5) measuring 15 mm.  Soft tissue density lesions also occupy the T9 and T10 vertebral bodies, best seen on sagittal imaging. There may be some right-sided extraosseous extension at T10.  No definite soft tissue mass in the central canal.  No destructive rib lesion is identified on the right to account for focus of increased radiotracer uptake and this may be secondary to degenerative disease.  Thoracic spine DISH.  IMPRESSION: 1.  Negative for lung cancer. 2.  Soft tissue density osteolytic lesions occupying T9 and T10 vertebral bodies, with possible small amount of extraosseous extension to the right of T10.  Although nonspecific, lymphoma, myeloma and metastatic disease of the primary considerations. Small metastatic lesion in the right sternal manubrium also present. 3.  Small hiatal hernia and fluid distended esophagus.  There may be some esophageal thickening distally.  Consider follow-up endoscopy.  Original Report Authenticated By: Andreas Newport, M.D.   Mr Cervical Spine Wo Contrast  03/14/2012  *RADIOLOGY REPORT*  Clinical Data:  Lytic lesions noted on CT.  Possible myeloma.  Back pain.  MRI CERVICAL AND THORACIC SPINE WITHOUT CONTRAST  Technique:  Multiplanar and multiecho pulse sequences of the cervical spine, to include the craniocervical junction and cervicothoracic junction, and the thoracic spine, were obtained without intravenous contrast.  Comparison:  03/09/2012 CT of the abdomen and pelvis.  08/02 03/28/2012 CT of the chest.  03/10/2012 bone scan.  The present examination was ordered as a cervical, thoracic and lumbar spine MR with contrast however, the patient was not able to tolerate imaging and only  a motion degraded MR of the cervical and thoracic spine without contrast was not able to be obtained.  MRI CERVICAL SPINE  Findings:  Motion degraded exam.  Subtle altered signal intensity of bone marrow throughout the cervical spine raises  possibility of involvement by tumor although less pronounced than seen in the thoracic region.  No obvious epidural extension of tumor in the region of the cervical spine.  Small areas of altered signal intensity suggest tumor involvement of portions of the clivus and occipital bone.  C5-6:  Broad-based disc osteophyte complex greater to the right with mild spinal stenosis and mild cord contact.  Uncinate bony overgrowth with moderate bilateral foraminal narrowing greater on the right.  C6-7:  Bulge/osteophyte greater to the right.  Mild spinal stenosis.  Minimal cord contact.  Mild bilateral foraminal narrowing.  IMPRESSION: Motion degraded exam.  Subtle altered signal intensity of bone marrow throughout the cervical spine raises possibility of involvement by tumor although less pronounced than seen in the thoracic region.  No obvious epidural extension of tumor in the region of the cervical spine.  Small areas of altered signal intensity suggest tumor involvement of portions of the clivus and occipital bone.  Degenerative changes C5-6 and C6-7 as noted above.  MRI THORACIC SPINE  Small areas of altered signal intensity involving portions of T 3 through T8 vertebral body and left T6 facet suggestive of involvement by tumor.  Tumor involvement of a majority of the T9 and T10 vertebral body with extension into the T8-9 disc space and superior aspect of the T11 vertebral body. T9 and T10 tumor involvement extends into the posterior elements and there is mild bony expansion of the posterior aspect of the diseased vertebra greater to left midline without significant spinal stenosis or cord compression. Paraspinal extension tumor extends in the anterior lateral position bilaterally.  Destructive lesion T12 vertebra with 15% loss of height. Epidural extension of tumor with spinal stenosis and crowding of the distal cord / conus.  Extension of tumor into the neural foramen greater at the T12-L1 level with small amount of  tumor extension into the T11-12 neural foramen.  Abnormal appearance of the ribs bilaterally.  This may represent result of anemia.  IMPRESSION: Destructive lesion T12 vertebra with epidural and foraminal extension of tumor as detailed above.  This causes spinal stenosis and crowding of the distal cord / conus.  Tumor involvement from the lower T8 to upper T11 with mild bony expansion of the  posterior aspect of the diseased T9 and T10 vertebral body but without cord compression.  Small areas of tumor involvement T3-T8.  Please see above for further detail.  This has been made a PRA call report utilizing dashboard call feature.  Original Report Authenticated By: Fuller Canada, M.D.   Mr Cervical Spine W Contrast  03/15/2012  *RADIOLOGY REPORT*  Clinical Data:  Back pain.  Possible myeloma.  MRI CERVICAL AND THORACIC SPINE WITH CONTRAST  Technique:  Multiplanar and multiecho pulse sequences of the cervical spine, to include the craniocervical junction and cervicothoracic junction, and the thoracic spine, were obtained with contrast.  Contrast: 20mL MULTIHANCE GADOBENATE DIMEGLUMINE 529 MG/ML IV SOLN  Comparison:  Precontrast MR of the cervical and thoracic spine performed 03/14/2012.  MRI CERVICAL SPINE  Findings:  Motion degraded exam.  Abnormal areas of enhancement consistent with involvement by tumor involving portions of the clivus, occipital condyles and all of the cervical vertebra.  Most prominent involvement is that involving the C2, C3 and  C5 vertebral body as well as the right C5 facet and left C7 facet.  No obvious epidural extension of tumor noted on this motion degraded examination nor is there evidence of foraminal extension.  Regions of altered signal intensity involving portions of the cervical cord at the C3 and C5-6 level probably related to motion artifact rather than  involvement by tumor.  Enlarged left thyroid gland.  Tumor not excluded.  Ultrasound can be obtained for further delineation.   IMPRESSION: Motion degraded exam.  Abnormal areas of enhancement consistent with involvement by tumor involving portions of the clivus, occipital condyles and all of the cervical vertebra.  Most prominent involvement is that involving the C2, C3 and C5 vertebral body as well as the right C5 facet and left C7 facet.  No obvious epidural extension of tumor noted on this motion degraded examination nor is there evidence of foraminal extension.  Enlarged left thyroid gland.  Tumor not excluded.  Ultrasound can be obtained for further delineation.  MRI THORACIC SPINE  Findings: Only a significantly motion degraded sagittal post contrast examination of the thoracic spine was able to be obtained secondary to patient discomfort. Tumor involvement of T12 vertebra is better delineated on the lumbar spine MR.  Diffuse tumor involvement extends from the inferior T8 plate through the upper T11 end plate involving a majority of the T9 and T10 vertebral body extending into the posterior elements with mild expansion of the diseased vertebra contributing to the mild spinal stenosis.  Epidural extension of tumor/engorged veins greater on the right extending from T9 to below T12 (most notable epidural tumor at the T12 level).  Diffuse tumor involvement of the T12 vertebral body involving posterior elements with loss of height centrally inferior aspect. Epidural extension of tumor with crowding of the distal cord / conus.  Foraminal extension of epidural disease more notable T12-L1 then at the T11-T12 level with encroachment upon the exiting T12 nerve roots.  Small areas of tumor involvement T3 through T8 vertebra including T3 and T4 spinous process, left T6 facet and left T8 facet.  IMPRESSION: Only a significantly motion degraded sagittal post contrast examination of the thoracic spine was able to be obtained.  Diffuse tumor involvement extends from the inferior T8 plate through the upper T11 end plate involving a majority of the T9  and T10 vertebral body extending into the posterior elements with mild expansion of the diseased vertebra contributing to the mild spinal stenosis.  Epidural extension of tumor/engorged veins greater on the right extending from T9 to below T12 (most notable epidural tumor at the T12 level).  Diffuse tumor involvement of the T12 vertebral body involving posterior elements with loss of height centrally inferior aspect. Epidural extension of tumor with crowding of the distal cord / conus.  Foraminal extension of epidural disease more notable T12-L1 then at the T11-T12 level with encroachment upon the exiting T12 nerve roots.  Small areas of tumor involvement T3 through T8 vertebra including T3 and T4 spinous process, left T6 facet and left T8 facet.  MRI LUMBAR SPINE WITHOUT AND WITH CONTRAST  Technique:  Multiplanar and multiecho pulse sequences of the lumbar spine were obtained without and with intravenous contrast.  Contrast: 20mL MULTIHANCE GADOBENATE DIMEGLUMINE 529 MG/ML IV SOLN  Comparison:  CT abdomen and pelvis 03/09/2012.  Findings:  Tumor involvement of all of the visualized vertebra from L1 through the lowest aspect of the sacrum imaged (S3). Tumor involvement of the ilium bilaterally. Most notable tumor involvement L3 vertebral  body and right aspect of the sacral ala.  No epidural extension of tumor in the lumbar-sacral spine.  Conus L1 level.  L1-2:  Right posterior lateral/paracentral protrusion.  Slight crowding of the exiting right L1 nerve root and mild impression upon the right ventral aspect of the thecal sac.  L2-3:  Broad-based disc osteophyte complex greater right lateral position with slight impression upon the exiting right L2 nerve root.  Facet joint degenerative changes.  L3-4:  Baseline broad-based disc osteophyte complex.  Lateral extension touches but does not compress the exiting L3 nerve roots. Small central disc protrusion with minimal indentation upon the thecal sac.  Mild facet joint  degenerative changes.  L4-5:  Small to moderate sized broad-based disc protrusion greater to the left.  Left greater than right lateral recess stenosis with mild spinal stenosis.  L5-S1:  Minimal bulge.  IMPRESSION: Tumor involvement of all of the visualized vertebra from L1 through the lowest aspect of the sacrum imaged (S3). Tumor involvement of the ilium bilaterally. Most notable tumor involvement L3 vertebral body and right aspect of the sacral ala.  No epidural extension of tumor in the lumbar-sacral spine.  Lumbar spine degenerative changes as detailed above.  Original Report Authenticated By: Fuller Canada, M.D.   Mr Thoracic Spine Wo Contrast  03/14/2012  *RADIOLOGY REPORT*  Clinical Data:  Lytic lesions noted on CT.  Possible myeloma.  Back pain.  MRI CERVICAL AND THORACIC SPINE WITHOUT CONTRAST  Technique:  Multiplanar and multiecho pulse sequences of the cervical spine, to include the craniocervical junction and cervicothoracic junction, and the thoracic spine, were obtained without intravenous contrast.  Comparison:  03/09/2012 CT of the abdomen and pelvis.  08/02 03/28/2012 CT of the chest.  03/10/2012 bone scan.  The present examination was ordered as a cervical, thoracic and lumbar spine MR with contrast however, the patient was not able to tolerate imaging and only a motion degraded MR of the cervical and thoracic spine without contrast was not able to be obtained.  MRI CERVICAL SPINE  Findings:  Motion degraded exam.  Subtle altered signal intensity of bone marrow throughout the cervical spine raises possibility of involvement by tumor although less pronounced than seen in the thoracic region.  No obvious epidural extension of tumor in the region of the cervical spine.  Small areas of altered signal intensity suggest tumor involvement of portions of the clivus and occipital bone.  C5-6:  Broad-based disc osteophyte complex greater to the right with mild spinal stenosis and mild cord contact.   Uncinate bony overgrowth with moderate bilateral foraminal narrowing greater on the right.  C6-7:  Bulge/osteophyte greater to the right.  Mild spinal stenosis.  Minimal cord contact.  Mild bilateral foraminal narrowing.  IMPRESSION: Motion degraded exam.  Subtle altered signal intensity of bone marrow throughout the cervical spine raises possibility of involvement by tumor although less pronounced than seen in the thoracic region.  No obvious epidural extension of tumor in the region of the cervical spine.  Small areas of altered signal intensity suggest tumor involvement of portions of the clivus and occipital bone.  Degenerative changes C5-6 and C6-7 as noted above.  MRI THORACIC SPINE  Small areas of altered signal intensity involving portions of T 3 through T8 vertebral body and left T6 facet suggestive of involvement by tumor.  Tumor involvement of a majority of the T9 and T10 vertebral body with extension into the T8-9 disc space and superior aspect of the T11 vertebral body. T9 and  T10 tumor involvement extends into the posterior elements and there is mild bony expansion of the posterior aspect of the diseased vertebra greater to left midline without significant spinal stenosis or cord compression. Paraspinal extension tumor extends in the anterior lateral position bilaterally.  Destructive lesion T12 vertebra with 15% loss of height. Epidural extension of tumor with spinal stenosis and crowding of the distal cord / conus.  Extension of tumor into the neural foramen greater at the T12-L1 level with small amount of tumor extension into the T11-12 neural foramen.  Abnormal appearance of the ribs bilaterally.  This may represent result of anemia.  IMPRESSION: Destructive lesion T12 vertebra with epidural and foraminal extension of tumor as detailed above.  This causes spinal stenosis and crowding of the distal cord / conus.  Tumor involvement from the lower T8 to upper T11 with mild bony expansion of the   posterior aspect of the diseased T9 and T10 vertebral body but without cord compression.  Small areas of tumor involvement T3-T8.  Please see above for further detail.  This has been made a PRA call report utilizing dashboard call feature.  Original Report Authenticated By: Fuller Canada, M.D.   Mr Thoracic Spine W Contrast  03/15/2012  *RADIOLOGY REPORT*  Clinical Data:  Back pain.  Possible myeloma.  MRI CERVICAL AND THORACIC SPINE WITH CONTRAST  Technique:  Multiplanar and multiecho pulse sequences of the cervical spine, to include the craniocervical junction and cervicothoracic junction, and the thoracic spine, were obtained with contrast.  Contrast: 20mL MULTIHANCE GADOBENATE DIMEGLUMINE 529 MG/ML IV SOLN  Comparison:  Precontrast MR of the cervical and thoracic spine performed 03/14/2012.  MRI CERVICAL SPINE  Findings:  Motion degraded exam.  Abnormal areas of enhancement consistent with involvement by tumor involving portions of the clivus, occipital condyles and all of the cervical vertebra.  Most prominent involvement is that involving the C2, C3 and C5 vertebral body as well as the right C5 facet and left C7 facet.  No obvious epidural extension of tumor noted on this motion degraded examination nor is there evidence of foraminal extension.  Regions of altered signal intensity involving portions of the cervical cord at the C3 and C5-6 level probably related to motion artifact rather than  involvement by tumor.  Enlarged left thyroid gland.  Tumor not excluded.  Ultrasound can be obtained for further delineation.  IMPRESSION: Motion degraded exam.  Abnormal areas of enhancement consistent with involvement by tumor involving portions of the clivus, occipital condyles and all of the cervical vertebra.  Most prominent involvement is that involving the C2, C3 and C5 vertebral body as well as the right C5 facet and left C7 facet.  No obvious epidural extension of tumor noted on this motion degraded  examination nor is there evidence of foraminal extension.  Enlarged left thyroid gland.  Tumor not excluded.  Ultrasound can be obtained for further delineation.  MRI THORACIC SPINE  Findings: Only a significantly motion degraded sagittal post contrast examination of the thoracic spine was able to be obtained secondary to patient discomfort. Tumor involvement of T12 vertebra is better delineated on the lumbar spine MR.  Diffuse tumor involvement extends from the inferior T8 plate through the upper T11 end plate involving a majority of the T9 and T10 vertebral body extending into the posterior elements with mild expansion of the diseased vertebra contributing to the mild spinal stenosis.  Epidural extension of tumor/engorged veins greater on the right extending from T9 to below T12 (most  notable epidural tumor at the T12 level).  Diffuse tumor involvement of the T12 vertebral body involving posterior elements with loss of height centrally inferior aspect. Epidural extension of tumor with crowding of the distal cord / conus.  Foraminal extension of epidural disease more notable T12-L1 then at the T11-T12 level with encroachment upon the exiting T12 nerve roots.  Small areas of tumor involvement T3 through T8 vertebra including T3 and T4 spinous process, left T6 facet and left T8 facet.  IMPRESSION: Only a significantly motion degraded sagittal post contrast examination of the thoracic spine was able to be obtained.  Diffuse tumor involvement extends from the inferior T8 plate through the upper T11 end plate involving a majority of the T9 and T10 vertebral body extending into the posterior elements with mild expansion of the diseased vertebra contributing to the mild spinal stenosis.  Epidural extension of tumor/engorged veins greater on the right extending from T9 to below T12 (most notable epidural tumor at the T12 level).  Diffuse tumor involvement of the T12 vertebral body involving posterior elements with loss of  height centrally inferior aspect. Epidural extension of tumor with crowding of the distal cord / conus.  Foraminal extension of epidural disease more notable T12-L1 then at the T11-T12 level with encroachment upon the exiting T12 nerve roots.  Small areas of tumor involvement T3 through T8 vertebra including T3 and T4 spinous process, left T6 facet and left T8 facet.  MRI LUMBAR SPINE WITHOUT AND WITH CONTRAST  Technique:  Multiplanar and multiecho pulse sequences of the lumbar spine were obtained without and with intravenous contrast.  Contrast: 20mL MULTIHANCE GADOBENATE DIMEGLUMINE 529 MG/ML IV SOLN  Comparison:  CT abdomen and pelvis 03/09/2012.  Findings:  Tumor involvement of all of the visualized vertebra from L1 through the lowest aspect of the sacrum imaged (S3). Tumor involvement of the ilium bilaterally. Most notable tumor involvement L3 vertebral body and right aspect of the sacral ala.  No epidural extension of tumor in the lumbar-sacral spine.  Conus L1 level.  L1-2:  Right posterior lateral/paracentral protrusion.  Slight crowding of the exiting right L1 nerve root and mild impression upon the right ventral aspect of the thecal sac.  L2-3:  Broad-based disc osteophyte complex greater right lateral position with slight impression upon the exiting right L2 nerve root.  Facet joint degenerative changes.  L3-4:  Baseline broad-based disc osteophyte complex.  Lateral extension touches but does not compress the exiting L3 nerve roots. Small central disc protrusion with minimal indentation upon the thecal sac.  Mild facet joint degenerative changes.  L4-5:  Small to moderate sized broad-based disc protrusion greater to the left.  Left greater than right lateral recess stenosis with mild spinal stenosis.  L5-S1:  Minimal bulge.  IMPRESSION: Tumor involvement of all of the visualized vertebra from L1 through the lowest aspect of the sacrum imaged (S3). Tumor involvement of the ilium bilaterally. Most notable  tumor involvement L3 vertebral body and right aspect of the sacral ala.  No epidural extension of tumor in the lumbar-sacral spine.  Lumbar spine degenerative changes as detailed above.  Original Report Authenticated By: Fuller Canada, M.D.   Mr Lumbar Spine W Wo Contrast  03/15/2012  *RADIOLOGY REPORT*  Clinical Data:  Back pain.  Possible myeloma.  MRI CERVICAL AND THORACIC SPINE WITH CONTRAST  Technique:  Multiplanar and multiecho pulse sequences of the cervical spine, to include the craniocervical junction and cervicothoracic junction, and the thoracic spine, were obtained with contrast.  Contrast: 20mL MULTIHANCE GADOBENATE DIMEGLUMINE 529 MG/ML IV SOLN  Comparison:  Precontrast MR of the cervical and thoracic spine performed 03/14/2012.  MRI CERVICAL SPINE  Findings:  Motion degraded exam.  Abnormal areas of enhancement consistent with involvement by tumor involving portions of the clivus, occipital condyles and all of the cervical vertebra.  Most prominent involvement is that involving the C2, C3 and C5 vertebral body as well as the right C5 facet and left C7 facet.  No obvious epidural extension of tumor noted on this motion degraded examination nor is there evidence of foraminal extension.  Regions of altered signal intensity involving portions of the cervical cord at the C3 and C5-6 level probably related to motion artifact rather than  involvement by tumor.  Enlarged left thyroid gland.  Tumor not excluded.  Ultrasound can be obtained for further delineation.  IMPRESSION: Motion degraded exam.  Abnormal areas of enhancement consistent with involvement by tumor involving portions of the clivus, occipital condyles and all of the cervical vertebra.  Most prominent involvement is that involving the C2, C3 and C5 vertebral body as well as the right C5 facet and left C7 facet.  No obvious epidural extension of tumor noted on this motion degraded examination nor is there evidence of foraminal extension.   Enlarged left thyroid gland.  Tumor not excluded.  Ultrasound can be obtained for further delineation.  MRI THORACIC SPINE  Findings: Only a significantly motion degraded sagittal post contrast examination of the thoracic spine was able to be obtained secondary to patient discomfort. Tumor involvement of T12 vertebra is better delineated on the lumbar spine MR.  Diffuse tumor involvement extends from the inferior T8 plate through the upper T11 end plate involving a majority of the T9 and T10 vertebral body extending into the posterior elements with mild expansion of the diseased vertebra contributing to the mild spinal stenosis.  Epidural extension of tumor/engorged veins greater on the right extending from T9 to below T12 (most notable epidural tumor at the T12 level).  Diffuse tumor involvement of the T12 vertebral body involving posterior elements with loss of height centrally inferior aspect. Epidural extension of tumor with crowding of the distal cord / conus.  Foraminal extension of epidural disease more notable T12-L1 then at the T11-T12 level with encroachment upon the exiting T12 nerve roots.  Small areas of tumor involvement T3 through T8 vertebra including T3 and T4 spinous process, left T6 facet and left T8 facet.  IMPRESSION: Only a significantly motion degraded sagittal post contrast examination of the thoracic spine was able to be obtained.  Diffuse tumor involvement extends from the inferior T8 plate through the upper T11 end plate involving a majority of the T9 and T10 vertebral body extending into the posterior elements with mild expansion of the diseased vertebra contributing to the mild spinal stenosis.  Epidural extension of tumor/engorged veins greater on the right extending from T9 to below T12 (most notable epidural tumor at the T12 level).  Diffuse tumor involvement of the T12 vertebral body involving posterior elements with loss of height centrally inferior aspect. Epidural extension of  tumor with crowding of the distal cord / conus.  Foraminal extension of epidural disease more notable T12-L1 then at the T11-T12 level with encroachment upon the exiting T12 nerve roots.  Small areas of tumor involvement T3 through T8 vertebra including T3 and T4 spinous process, left T6 facet and left T8 facet.  MRI LUMBAR SPINE WITHOUT AND WITH CONTRAST  Technique:  Multiplanar and  multiecho pulse sequences of the lumbar spine were obtained without and with intravenous contrast.  Contrast: 20mL MULTIHANCE GADOBENATE DIMEGLUMINE 529 MG/ML IV SOLN  Comparison:  CT abdomen and pelvis 03/09/2012.  Findings:  Tumor involvement of all of the visualized vertebra from L1 through the lowest aspect of the sacrum imaged (S3). Tumor involvement of the ilium bilaterally. Most notable tumor involvement L3 vertebral body and right aspect of the sacral ala.  No epidural extension of tumor in the lumbar-sacral spine.  Conus L1 level.  L1-2:  Right posterior lateral/paracentral protrusion.  Slight crowding of the exiting right L1 nerve root and mild impression upon the right ventral aspect of the thecal sac.  L2-3:  Broad-based disc osteophyte complex greater right lateral position with slight impression upon the exiting right L2 nerve root.  Facet joint degenerative changes.  L3-4:  Baseline broad-based disc osteophyte complex.  Lateral extension touches but does not compress the exiting L3 nerve roots. Small central disc protrusion with minimal indentation upon the thecal sac.  Mild facet joint degenerative changes.  L4-5:  Small to moderate sized broad-based disc protrusion greater to the left.  Left greater than right lateral recess stenosis with mild spinal stenosis.  L5-S1:  Minimal bulge.  IMPRESSION: Tumor involvement of all of the visualized vertebra from L1 through the lowest aspect of the sacrum imaged (S3). Tumor involvement of the ilium bilaterally. Most notable tumor involvement L3 vertebral body and right aspect of  the sacral ala.  No epidural extension of tumor in the lumbar-sacral spine.  Lumbar spine degenerative changes as detailed above.  Original Report Authenticated By: Fuller Canada, M.D.   Nm Bone Scan Whole Body  03/10/2012  *RADIOLOGY REPORT*  Clinical Data: Lumbar spine pain.  Evaluate lytic lesions.  NUCLEAR MEDICINE WHOLE BODY BONE SCINTIGRAPHY  Technique:  Whole body anterior and posterior images were obtained approximately 3 hours after intravenous injection of radiopharmaceutical.  Radiopharmaceutical: CURIE TC-MDP TECHNETIUM TC 8M MEDRONATE IV KIT  Comparison: CT 03/09/2012.  Findings: Increased uptake is present in the T12 vertebra, greater on the right than left.  This corresponds with the lytic lesions seen on recent CT.  This is best seen on the posterior views with increased uptake projected over the pedicles and extension into the posterior elements.  Degenerative changes are present at the knees, shoulders, and ankles.  There is a small right posterior focus of increased uptake in the right ribs, apparently in the right posterior ninth rib. This could be degenerative or represent a tiny focus of metastatic disease not visualized on prior CT.  Retrospective review of the CT was performed and no definite lytic lesion was present.  IMPRESSION: Increased radiotracer uptake in the T12 vertebra corresponds with the lytic lesion on recent CT, most compatible with neoplasm, likely metastatic disease or myeloma.  Original Report Authenticated By: Andreas Newport, M.D.   US Soft Tissue Head/neck  03/11/2012  *RADIOLOGY REPORT*  Clinical Data: Asymmetric enlargement on recent CT  THYROID ULTRASOUND  Technique: Ultrasound examination of the thyroid gland and adjacent soft tissues was performed.  Comparison:  CT 03/10/2012  Findings:  Right thyroid lobe:  24 x 26 x 56 mm, inhomogeneous Left thyroid lobe:  24 x 35 x 59 mm Isthmus:  5 mm in thickness  Focal nodules:  7 x 8 x 10 mm complex mostly solid,  mid-right 7 x 10 x 15 mm complex mostly solid, inferior right 8 x 12 x 14 mm solid, superior left 16 x  19 x 23 mm complex mostly solid, mid-left 12 x 14 x 14 mm hypoechoic solid, inferior left There are least three additional smaller bilateral and isthmic nodules measuring less than 1 cm.  Lymphadenopathy:  None visualized.  IMPRESSION:  Multiple bilateral thyroid nodules, dominant lesion on the left. Findings meet consensus criteria for biopsy.  Ultrasound-guided fine needle aspiration should be considered, as per the consensus statement: Management of Thyroid Nodules Detected at [place]:  Society of Radiologists in Ultrasound Consensus Conference Statement. Radiology 2005; [id number].  Original Report Authenticated By: Osa Craver, M.D.   Ct Abdomen Pelvis W Contrast  03/09/2012  *RADIOLOGY REPORT*  Clinical Data: LLQ pain, constipation  CT ABDOMEN AND PELVIS WITH CONTRAST  Technique:  Multidetector CT imaging of the abdomen and pelvis was performed following the standard protocol during bolus administration of intravenous contrast.  Contrast: OMNIPAQUE IOHEXOL 300 MG/ML  SOLN  Comparison: None.  Findings: Lung bases are essentially clear.  Small hiatal hernia.  Liver, pancreas, and adrenal glands are within normal limits.  Gallbladder is mildly distended but without associated inflammatory changes by CT.  No intrahepatic or extrahepatic ductal dilatation.  Kidneys are within normal limits.  No hydronephrosis.  No evidence of bowel obstruction.  Normal appendix.  Colonic diverticulosis, without associated inflammatory changes.  No suspicious abdominopelvic lymphadenopathy.  Prostatomegaly, measuring 6.1 cm in transverse dimension.  Bladder is unremarkable.  Expansile lytic lesion within the left posterior T12 vertebral body (series 2/image 26) with suspected cortical disruption posteriorly (sagittal image 69).  Additional suspected lytic lesions anteriorly in the L1 vertebral body (sagittal image 66)  and posteriorly in the L3 vertebral body (sagittal image 63).  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  IMPRESSION: No evidence of bowel obstruction.  Normal appendix.  Colonic diverticulosis, without associated inflammatory changes.  No CT findings to account for the patient's abdominal pain.  Expansile lytic lesion within the left T12 vertebral body with suspected cortical disruption posteriorly.  Additional suspected lytic lesions and L1 and L3.  Primary differential considerations are metastases and myeloma.  Original Report Authenticated By: Charline Bills, M.D.   US Thyroid Biopsy  03/13/2012  *RADIOLOGY REPORT*  Clinical Data/Indication: Dominant left thyroid nodule  ULTRASOUND-GUIDED BIOPSY OF A DOMINANT LEFT THYROID NODULE.  FINE NEEDLE ASPIRATION.  Procedure: The procedure, risks, benefits, and alternatives were explained to the patient. Questions regarding the procedure were encouraged and answered. The patient understands and consents to the procedure.  The neck was prepped with betadine in a sterile fashion, and a sterile drape was applied covering the operative field. A mask and sterile gloves were used for the procedure.  Under sonographic guidance, three 25 gauge fine needle aspirates of the dominant left thyroid nodule were obtained. Final imaging was performed.  Findings: The images document guide needle placement within the abdominal left thyroid nodule. Post biopsy images demonstrate no hemorrhage.  IMPRESSION: Successful ultrasound-guided fine needle aspiration of a dominant left thyroid nodule.  Original Report Authenticated By: Donavan Burnet, M.D.   Ct Biopsy  03/15/2012  *RADIOLOGY REPORT*  Indication: Evaluate for multiple myeloma  CT GUIDED LEFT ILIAC BONE MARROW ASPIRATION AND BONE MARROW CORE BIOPSY  Intravenous medications: Fentanyl 100 mcg IV; Versed 1 mg IV  Sedation time: 10 minutes  Contrast volume: None   Complications: None immediate  PROCEDURE/FINDINGS:  Informed consent was obtained from the patient following an explanation of the procedure, risks, benefits and alternatives. The patient understands, agrees and consents  for the procedure. All questions were addressed.  A time out was performed prior to the initiation of the procedure.  The patient was positioned prone and noncontrast localization CT was performed of the pelvis to demonstrate the iliac marrow spaces.  The operative site was prepped and draped in the usual sterile fashion.  Under sterile conditions and local anesthesia, an 11 gauge coaxial bone biopsy needle was advanced into the left iliac marrow space. Needle position was confirmed with CT imaging.  Initially, bone marrow aspiration was performed. Next, a bone marrow biopsy was obtained with the 11 gauge outer bone marrow device.  Samples were prepared with the cytotechnologist and deemed adequate.  The needle was removed intact.  Hemostasis was obtained with compression and a dressing was placed. The patient tolerated the procedure well without immediate post procedural complication.  IMPRESSION:  Successful CT guided left iliac bone marrow aspiration and core biopsy.  Original Report Authenticated By: Waynard Reeds, M.D.   Ct Biopsy  03/13/2012  *RADIOLOGY REPORT*  Clinical Data/Indication: T12 VERTEBRAL BODY LESION  CT-GUIDED BIOPSY HAVE A T12 VERTEBRAL BODY LESION.  CORE.  Sedation: Versed 2.0 mg, Fentanyl 100 mcg.  Total Moderate Sedation Time: 30 minutes.  Procedure: The procedure, risks, benefits, and alternatives were explained to the patient. Questions regarding the procedure were encouraged and answered. The patient understands and consents to the procedure.  The low back in the prone position was prepped with betadine in a sterile fashion, and a sterile drape was applied covering the operative field. A sterile gown and sterile gloves were used for the procedure.  Under CT guidance,  the 17 gauge needle was inserted into the T12 vertebral body lesion via left paramedian approach.  Four 18 gauge core biopsies were obtained. Final imaging was performed.  Patient tolerated the procedure well without complication.  Vital sign monitoring by nursing staff during the procedure will continue as patient is in the special procedures unit for post procedure observation.  Findings: The images document guide needle placement within the T12 vertebral body lesion.  Post biopsy images demonstrate no hemorrhage.  IMPRESSION: Successful CT-guided core biopsy of a T12 vertebral body lesion.  Original Report Authenticated By: Donavan Burnet, M.D.   Dg Bone Survey Met  03/15/2012  *RADIOLOGY REPORT*  Clinical Data: Multiple myeloma.  METASTATIC BONE SURVEY  Comparison: None.  Findings: There is one tiny lytic lesion in the frontal bone of the skull on the lateral view.  There are lytic lesions in T12, T10, T9 and T3 and T4.  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  Posterior elements and the vertebral body are partially destroyed at T12.  There are a few tiny areas of lucency in the pelvic bones and in the proximal femurs bilaterally. There are tiny lucent lesions in the right scapula and proximal right humerus.  The lesions in the sacrum seen on the lumbar MRI are not appreciable on these radiographs.  IMPRESSION: Multiple lesions of multiple myeloma including destruction of the T12 vertebral body.  Lytic lesions elsewhere in the thoracic spine as described and as reported on the thoracic MRI.  Original Report Authenticated By: Gwynn Burly, M.D.   Studies/Results: No results found.  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 Details/risks  of  procedure d/w pt/wife with their understanding and consent. Pt will be transported via [facility] to [facility] 8/13 to arrive by 0815 for 0930 start time tent. He will return to [facility] following procedure/PACU clearance.    LOS: 11 days    Diamante Truszkowski,D [facility] 03/20/2012

## 2012-03-20 NOTE — Progress Notes (Signed)
I appreciate everybody's help in my absence. I especially appreciate Dr. Gaylyn Rong for helping out and talking to Robert Berger and his girlfriend about the diagnosis.  We are dealing with myeloma. He does not have a lot of protein but yet his biopsies are clearly show myeloma.  He is to have his kyphoplasty. Hopefully we'll have this tomorrow.  Radiation therapy is clearly necessary. Dr. Karoline Caldwell from radiation oncology is aware of RobertBerger and will initiate radiation therapy once the kyphoplasty was done.  Robert Berger will also needs systemic chemotherapy. He should respond nicely to our programs that we have for myeloma. I probably would consider Revlimid/Velcade. I think this would be very reasonable for him. I think he can tolerate this.  He still has not had the MRI of his lumbar spine. I'm not sure as to why he did not have this last week.  He wanted know if he could go home. I told him that I thought it would be very difficult for him at home right now. Again, if he has a good response to the kyphoplasty with pain control, then he might be able to go home. He would need to make sure that he can get transportation for his radiation therapy. I would suspect radiation would be 3 weeks.  We can initiate chemotherapy this week. I'm sure the hospital probably could not get Revlimid for him. Velcade is subcutaneous that should not be a problem. That also should not interfere with radiation therapy.  I spent about 30 minutes with Robert Berger. I explained what was the plan and he seemed to understand this. For now, the priority is to get the kyphoplasty. This is supposed to be set up for tomorrow at [facility]. We will see how the kyphoplasty does and how well his pain is controlled and how mobile he will be.  Robert E.

## 2012-03-20 NOTE — Progress Notes (Signed)
Inpt at [facility], spoke with pt and pt's nurse, Arline Asp, regarding procedure tomorrow.

## 2012-03-20 NOTE — Progress Notes (Addendum)
[facility] PROGRESS NOTE  Martavious Hartel ZOX:[id number] DOB: July 08, 1941 DOA: 03/09/2012 PCP: No primary provider on file.  Brief narrative: 71 year old man who presented to the hospital on 03/09/12 with a 1 week history of abdominal pain, back pain and constipation. CT scan findings showed expansile lytic lesions involving thoracic and lumbar spine The patient underwent CT guided biopsy of T12 lesion with pathology findings consistent with plasma cell disorder. Diagnostic bone marrow biopsy subsequently done with findings of 11% plasma cells. Bone marrow aspirate for cytogenics and myeloma FISH panel are still pending.  Plan per radiology is for ablation/vertebroplasty procedure of T9-T12  03/21/2012 and chemotherapy afterwards. We still need PT evaluation which has been put on hold due to back pain and hopefully will be assumed post kyphoplasty.  Assessment/Plan:   Principal Problem:  *Back pain secondary to IgG kappa multiple myeloma  Per oncology patient needs local therapy for pain control before chemotherapy which includes kyphoplasty and palliative radiation (after kyphoplasty)  Appreciated very much a wonderful explanation by an oncologist in regards to multiple myeloma treatment plan which is dependant on cytogenetics results and FISH panel  Patient was already given Zometa 03/13/2012 but the plan is to perhaps give it again to prevent skeletal related events but prior to that he would need dental evaluation as zometa can cause osteonecrosis of the jaw and this patient already has a poor dentition  Continue dexamethasone PO 4 mg Q 4 hours; CBG under relatively good control Current pain regimen: Oxycontin 20 mg BID PO scheduled and dilaudid 1 mg Q 4 hours IV PRN and Roxicodone 5 mg Q 4 hours PRN  PT evaluation - pending  Active Problems:  Thyromegaly, probable goiter Thyroid ultrasound done 03/11/12, shows multiple nodules  status post biopsy[- non neoplastic goiter  Hypertension  BP  150/92; slightly above goal  Start Norvasc 5 mg daily  Hyperglycemia  Steroid induced  CBG 146, 244, 166  Start sliding scale insulin TIDAC and HS  Hiatal hernia and fluid filled esophagus / GERD  Continue Protonix daily   Abdominal pain  Secondary to radiating pain from back related to multiple myeloma  Patient had BM today and yesterday; continue current bowel regimen  Code Status: Full  Family Communication: girlfriend at bedside, updated 03/20/2012  Disposition Plan: PT evaluation - pending   Medical Consultants:  Dr. Arlan Organ, Oncology.  Other Consultants:  Physical therapy - pending Occupational therapy: (recomendation for 3:1 commode) Procedures:  Plan 03/21/2012 ablation/vertebroplasty procedure of T9-T12 with general anesthesia Antibiotics:  None.  [name], MD  [facility] Pager [phone]  If 7PM-7AM, please contact night-coverage [email or web address] Password [email or web address] 03/20/2012, 1:51 PM   LOS: 11 days   HPI/Subjective: No acute events overnight.  Objective: Filed Vitals:   03/19/12 0507 03/19/12 1518 03/19/12 2049 03/20/12 0525  BP: 152/81 151/83 143/83 150/92  Pulse: 91 80 68 67  Temp: 98.8 F (37.1 C) 97.7 F (36.5 C) 98.4 F (36.9 C) 98.3 F (36.8 C)  TempSrc: Oral Oral Oral Oral  Resp: 18 18 19 20   Height:      Weight:      SpO2: 96% 96% 98% 97%    Intake/Output Summary (Last 24 hours) at 03/20/12 1351 Last data filed at 03/20/12 1249  Gross per 24 hour  Intake   1317 ml  Output   2301 ml  Net   -984 ml    Exam:   General:  Pt is alert, follows commands appropriately, not in acute  distress  Cardiovascular: Regular rate and rhythm, S1/S2, no murmurs, no rubs, no gallops  Respiratory: Clear to auscultation bilaterally, no wheezing, no crackles, no rhonchi  Abdomen: Soft, non tender, non distended, bowel sounds present, no guarding  Extremities: No edema, pulses DP and PT palpable bilaterally  Neuro: Grossly  nonfocal  Data Reviewed: Basic Metabolic Panel:  Lab 03/17/12 4540 03/16/12 0356 03/15/12 0402  NA 134* 132* 129*  K 4.4 4.4 4.0  CL 103 102 96  CO2 23 21 27   GLUCOSE 149* 223* 150*  BUN 16 9 7   CREATININE 0.75 0.81 0.83  CALCIUM 8.2* 8.2* 8.2*   CBC:  Lab 03/17/12 0347 03/16/12 0356 03/15/12 0402  WBC 9.2 4.3 4.3  HGB 13.6 14.2 13.4  HCT 38.6* 41.1 37.9*  MCV 91.9 93.0 91.5  PLT 160 144* 123*   CBG:  Lab 03/20/12 0753 03/19/12 1704 03/19/12 0723 03/18/12 1706 03/18/12 0747  GLUCAP 146* 244* 166* 162* 191*    No results found for this or any previous visit (from the past 240 hour(s)).   Studies: No results found.  Scheduled Meds:   . baclofen  5 mg Oral TID  . dexamethasone  4 mg Oral Q6H  . docusate sodium  100 mg Oral BID  . feeding supplement  237 mL Oral BID BM  . oxyCODONE  20 mg Oral Q12H  . pantoprazole  40 mg Oral Q1200  . polyethylene glycol  17 g Oral Daily  . vancomycin  1,500 mg Intravenous On Call   Continuous Infusions:   . sodium chloride 1,000 mL (03/20/12 0145)

## 2012-03-20 NOTE — Progress Notes (Signed)
Dr. Myna Hidalgo stated the pt. Could leave present IV in. Site looks good. Dsg and tubing changed.

## 2012-03-21 ENCOUNTER — Ambulatory Visit (HOSPITAL_COMMUNITY)
Admit: 2012-03-21 | Discharge: 2012-03-21 | Disposition: A | Discharge status: Home / Self Care | Payer: BC Managed Care – PPO

## 2012-03-21 ENCOUNTER — Ambulatory Visit (HOSPITAL_COMMUNITY): Admit: 2012-03-21 | Payer: BC Managed Care – PPO

## 2012-03-21 ENCOUNTER — Encounter (HOSPITAL_COMMUNITY): Payer: Self-pay | Admitting: Certified Registered"

## 2012-03-21 ENCOUNTER — Inpatient Hospital Stay (HOSPITAL_COMMUNITY): Payer: BC Managed Care – PPO | Admitting: Certified Registered"

## 2012-03-21 ENCOUNTER — Ambulatory Visit (HOSPITAL_COMMUNITY)
Admission: RE | Admit: 2012-03-21 | Payer: BC Managed Care – PPO | Source: Ambulatory Visit | Admitting: Interventional Radiology

## 2012-03-21 ENCOUNTER — Encounter (HOSPITAL_COMMUNITY)
Admission: EM | Disposition: A | Discharge status: Skilled Nursing Facility | Payer: Self-pay | Source: Home / Self Care | Attending: Internal Medicine

## 2012-03-21 ENCOUNTER — Ambulatory Visit (HOSPITAL_COMMUNITY)
Admit: 2012-03-21 | Discharge: 2012-03-21 | Disposition: A | Discharge status: Home / Self Care | Payer: BC Managed Care – PPO | Attending: Hematology & Oncology | Admitting: Hematology & Oncology

## 2012-03-21 ENCOUNTER — Encounter (HOSPITAL_COMMUNITY): Payer: Self-pay | Admitting: *Deleted

## 2012-03-21 DIAGNOSIS — E049 Nontoxic goiter, unspecified: Secondary | ICD-10-CM

## 2012-03-21 DIAGNOSIS — E871 Hypo-osmolality and hyponatremia: Secondary | ICD-10-CM

## 2012-03-21 LAB — PROTEIN ELECTROPH W RFLX QUANT IMMUNOGLOBULINS
Albumin ELP: 46.4 % — ABNORMAL LOW (ref 55.8–66.1)
Alpha-1-Globulin: 4.5 % (ref 2.9–4.9)
Beta Globulin: 4.7 % (ref 4.7–7.2)
Total Protein ELP: 6.9 g/dL (ref 6.0–8.3)

## 2012-03-21 LAB — GLUCOSE, CAPILLARY: Glucose-Capillary: 124 mg/dL — ABNORMAL HIGH (ref 70–99)

## 2012-03-21 LAB — UIFE/LIGHT CHAINS/TP QN, 24-HR UR
Alpha 1, Urine: DETECTED — AB
Alpha 2, Urine: DETECTED — AB
Beta, Urine: DETECTED — AB
Free Kappa/Lambda Ratio: 16.59 ratio — ABNORMAL HIGH (ref 2.04–10.37)
Free Lt Chn Excr Rate: 78.48 mg/d
Total Protein, Urine-Ur/day: 99 mg/d (ref 10–140)
Total Protein, Urine: 4.6 mg/dL

## 2012-03-21 LAB — CBC
HCT: 39.4 % (ref 39.0–52.0)
Platelets: 193 10*3/uL (ref 150–400)
RDW: 13.8 % (ref 11.5–15.5)
WBC: 9.5 10*3/uL (ref 4.0–10.5)

## 2012-03-21 LAB — BASIC METABOLIC PANEL
Chloride: 103 mEq/L (ref 96–112)
GFR calc Af Amer: >90 mL/min (ref >90)
Potassium: 4.6 mEq/L (ref 3.5–5.1)
Sodium: 132 mEq/L — ABNORMAL LOW (ref 135–145)

## 2012-03-21 SURGERY — RADIOLOGY WITH ANESTHESIA
Anesthesia: General

## 2012-03-21 MED ORDER — PROPOFOL 10 MG/ML IV EMUL
INTRAVENOUS | Status: DC | PRN
  Administered 2012-03-21: 150 mg via INTRAVENOUS

## 2012-03-21 MED ORDER — ONDANSETRON HCL 4 MG/2ML IJ SOLN: 4.0000 mg | Freq: Once | INTRAMUSCULAR | Status: DC | PRN

## 2012-03-21 MED ORDER — GLYCOPYRROLATE 0.2 MG/ML IJ SOLN
INTRAMUSCULAR | Status: DC | PRN
  Administered 2012-03-21: .8 mg via INTRAVENOUS

## 2012-03-21 MED ORDER — HYDROMORPHONE HCL PF 1 MG/ML IJ SOLN
0.2500 mg | INTRAMUSCULAR | Status: DC | PRN
  Administered 2012-03-21 (×4): 0.5 mg via INTRAVENOUS

## 2012-03-21 MED ORDER — SODIUM CHLORIDE 0.9 % IV SOLN: INTRAVENOUS | Status: AC

## 2012-03-21 MED ORDER — ROCURONIUM BROMIDE 100 MG/10ML IV SOLN
INTRAVENOUS | Status: DC | PRN
  Administered 2012-03-21: 10 mg via INTRAVENOUS
  Administered 2012-03-21: 30 mg via INTRAVENOUS
  Administered 2012-03-21 (×2): 10 mg via INTRAVENOUS

## 2012-03-21 MED ORDER — NEOSTIGMINE METHYLSULFATE 1 MG/ML IJ SOLN
INTRAMUSCULAR | Status: DC | PRN
  Administered 2012-03-21: 5 mg via INTRAVENOUS

## 2012-03-21 MED ORDER — LACTATED RINGERS IV SOLN
INTRAVENOUS | Status: DC | PRN
  Administered 2012-03-21 (×2): via INTRAVENOUS

## 2012-03-21 MED ORDER — ONDANSETRON HCL 4 MG/2ML IJ SOLN
INTRAMUSCULAR | Status: DC | PRN
  Administered 2012-03-21: 4 mg via INTRAVENOUS

## 2012-03-21 MED ORDER — LIDOCAINE HCL (CARDIAC) 20 MG/ML IV SOLN
INTRAVENOUS | Status: DC | PRN
  Administered 2012-03-21: 80 mg via INTRAVENOUS

## 2012-03-21 MED ORDER — LIDOCAINE HCL 4 % MT SOLN
OROMUCOSAL | Status: DC | PRN
  Administered 2012-03-21: 4 mL via TOPICAL

## 2012-03-21 MED ORDER — HYDROMORPHONE HCL PF 1 MG/ML IJ SOLN: 1.0000 mg | INTRAMUSCULAR | Status: DC | PRN

## 2012-03-21 MED ORDER — HYDROMORPHONE HCL PF 1 MG/ML IJ SOLN
1.0000 mg | INTRAMUSCULAR | Status: AC | PRN
  Administered 2012-03-21 – 2012-03-22 (×6): 1 mg via INTRAVENOUS
  Filled 2012-03-21 (×5): qty 1

## 2012-03-21 MED ORDER — FENTANYL CITRATE 0.05 MG/ML IJ SOLN
INTRAMUSCULAR | Status: DC | PRN
  Administered 2012-03-21 (×3): 50 ug via INTRAVENOUS
  Administered 2012-03-21: 100 ug via INTRAVENOUS

## 2012-03-21 MED ORDER — HYDROMORPHONE HCL PF 1 MG/ML IJ SOLN
0.2500 mg | INTRAMUSCULAR | Status: DC | PRN
  Administered 2012-03-21: 0.5 mg via INTRAVENOUS

## 2012-03-21 NOTE — Transfer of Care (Signed)
Immediate Anesthesia Transfer of Care Note  Patient: Robert Berger  Procedure(s) Performed: Procedure(s) (LRB): RADIOLOGY WITH ANESTHESIA (N/A)  Patient Location: PACU  Anesthesia Type: General  Level of Consciousness: awake, alert  and oriented  Airway & Oxygen Therapy: Patient Spontanous Breathing and Patient connected to nasal cannula oxygen  Post-op Assessment: Report given to PACU RN and Patient moving all extremities X 4  Post vital signs: Reviewed and stable  Complications: No apparent anesthesia complications

## 2012-03-21 NOTE — Anesthesia Procedure Notes (Signed)
Procedure Name: Intubation Date/Time: 03/21/2012 10:04 AM Performed by: Jefm Miles E Pre-anesthesia Checklist: Patient identified, Patient being monitored, Timeout performed, Emergency Drugs available and Suction available Patient Re-evaluated:Patient Re-evaluated prior to inductionOxygen Delivery Method: Circle system utilized Preoxygenation: Pre-oxygenation with 100% oxygen Intubation Type: IV induction Ventilation: Two handed mask ventilation required and Oral airway inserted - appropriate to patient size Laryngoscope Size: Mac and 4 Grade View: Grade II Tube type: Oral Tube size: 7.5 mm Number of attempts: 1 Airway Equipment and Method: Stylet and LTA kit utilized Placement Confirmation: ETT inserted through vocal cords under direct vision,  breath sounds checked- equal and bilateral and positive ETCO2 Secured at: 23 cm Tube secured with: Tape Dental Injury: Teeth and Oropharynx as per pre-operative assessment

## 2012-03-21 NOTE — Progress Notes (Signed)
Patient reports no history of HTN but patient has been placed on anti- hypertensive while in hospital patient reports this is due to the pain and situational. Dr. Katrinka Blazing notified per Dr. Katrinka Blazing no EKG ordered

## 2012-03-21 NOTE — Anesthesia Preprocedure Evaluation (Addendum)
Anesthesia Evaluation  Patient identified by MRN, date of birth, ID band Patient awake    Reviewed: Allergy & Precautions, H&P , NPO status , Patient's Chart, lab work & pertinent test results  History of Anesthesia Complications (+) PROLONGED EMERGENCE  Airway Mallampati: I TM Distance: >3 FB Neck ROM: full    Dental  (+) Poor Dentition, Missing and Dental Advisory Given   Pulmonary former smoker,          Cardiovascular + CAD Rhythm:regular Rate:Normal     Neuro/Psych  Neuromuscular disease    GI/Hepatic hiatal hernia, GERD-  Medicated,  Endo/Other    Renal/GU      Musculoskeletal   Abdominal   Peds  Hematology   Anesthesia Other Findings   Reproductive/Obstetrics                          Anesthesia Physical Anesthesia Plan  ASA: II  Anesthesia Plan: General   Post-op Pain Management:    Induction: Intravenous  Airway Management Planned: Oral ETT  Additional Equipment:   Intra-op Plan:   Post-operative Plan:   Informed Consent: I have reviewed the patients History and Physical, chart, labs and discussed the procedure including the risks, benefits and alternatives for the proposed anesthesia with the patient or authorized representative who has indicated his/her understanding and acceptance.     Plan Discussed with: CRNA, Anesthesiologist and Surgeon  Anesthesia Plan Comments:         Anesthesia Quick Evaluation

## 2012-03-21 NOTE — Anesthesia Postprocedure Evaluation (Signed)
  Anesthesia Post-op Note  Patient: Robert Berger  Procedure(s) Performed: Procedure(s) (LRB): RADIOLOGY WITH ANESTHESIA (N/A)  Patient Location: PACU  Anesthesia Type: General  Level of Consciousness: awake, alert  and oriented  Airway and Oxygen Therapy: Patient Spontanous Breathing and Patient connected to nasal cannula oxygen  Post-op Pain: mild  Post-op Assessment: Post-op Vital signs reviewed  Post-op Vital Signs: Reviewed  Complications: No apparent anesthesia complications

## 2012-03-21 NOTE — Procedures (Signed)
S/P bone tumor ablation at T9,T10,T11 and T12 ,with KP augmentation. Multiple myeloma.

## 2012-03-21 NOTE — Preoperative (Signed)
Beta Blockers   Reason not to administer Beta Blockers:Not Applicable 

## 2012-03-21 NOTE — Progress Notes (Addendum)
[facility] PROGRESS NOTE  Yisrael Obryan ZOX:[id number] DOB: 1940-09-19 DOA: 03/09/2012 PCP: No primary provider on file.  Brief narrative: 71 year old man who presented to the hospital on 03/09/12 with a 1 week history of abdominal pain, back pain and constipation. CT scan findings showed expansile lytic lesions involving thoracic and lumbar spine The patient underwent CT guided biopsy of T12 lesion with pathology findings consistent with plasma cell disorder. Diagnostic bone marrow biopsy subsequently done with findings of 11% plasma cells. Bone marrow aspirate for cytogenics and myeloma FISH panel are still pending.  Patient will be going to [facility] for kyphoplasty to date 03/21/2012. We still need PT evaluation which has been put on hold due to back pain and hopefully will be assumed post kyphoplasty.   Assessment/Plan:   Principal Problem:  *Back pain secondary to IgG kappa multiple myeloma  Per oncology patient needs local therapy for pain control before chemotherapy which includes kyphoplasty and palliative radiation (after kyphoplasty)  Patient was already given Zometa 03/13/2012 but the plan is to perhaps give it again to prevent skeletal related events but prior to that he would need dental evaluation as zometa can cause osteonecrosis of the jaw and this patient already has a poor dentition  Continue dexamethasone PO 4 mg Q 4 hours Current pain regimen: Oxycontin 20 mg BID PO scheduled and dilaudid 1 mg Q 4 hours IV PRN and Roxicodone 5 mg Q 4 hours PRN  PT evaluation - pending  Active Problems:  Thyromegaly, probable goiter  Thyroid ultrasound done 03/11/12, shows multiple nodules  status post biopsy[- non neoplastic goiter  Hypertension  BP 152/85; slightly above goal  Started Norvasc 5 mg daily  Hyperglycemia  Steroid induced  CBG 222, 177, 146 Start sliding scale insulin TIDAC and HS  Hiatal hernia and fluid filled esophagus / GERD  Continue Protonix daily    Abdominal pain  Secondary to radiating pain from back related to multiple myeloma  Patient has now had bowel movement on daily basis; continue current bowel regimen  Code Status: Full  Family Communication: girlfriend at bedside, updated 03/20/2012  Disposition Plan: PT evaluation - pending   Medical Consultants:  Dr. Arlan Organ, Oncology.  Other Consultants:  Physical therapy - pending  Occupational therapy: (recomendation for 3:1 commode) Procedures:  Kyphoplasty 03/21/2012 Antibiotics:  None.  [name], MD  [facility] Pager [phone]  If 7PM-7AM, please contact night-coverage [email or web address] Password [facility] 03/21/2012, 6:54 AM   LOS: 12 days   HPI/Subjective: No acute events overnight.  Objective: Filed Vitals:   03/20/12 0525 03/20/12 1409 03/20/12 2135 03/21/12 0617  BP: 150/92 148/91 106/63 152/85  Pulse: 67 73 74 68  Temp: 98.3 F (36.8 C) 98.5 F (36.9 C) 98.2 F (36.8 C) 97.8 F (36.6 C)  TempSrc: Oral Oral Oral Oral  Resp: 20 20 18 18   Height:      Weight:      SpO2: 97% 97% 97% 96%    Intake/Output Summary (Last 24 hours) at 03/21/12 0654 Last data filed at 03/21/12 0500  Gross per 24 hour  Intake   2834 ml  Output   2451 ml  Net    383 ml    Exam:   General:  Pt is alert, follows commands appropriately, not in acute distress  Cardiovascular: Regular rate and rhythm, S1/S2, no murmurs, no rubs, no gallops  Respiratory: Clear to auscultation bilaterally, no wheezing, no crackles, no rhonchi  Abdomen: Soft, non tender, non distended, bowel  sounds present, no guarding  Extremities: No edema, pulses DP and PT palpable bilaterally  Neuro: Grossly nonfocal  Data Reviewed: Basic Metabolic Panel:  Lab 03/21/12 1610 03/20/12 1500 03/17/12 0347 03/16/12 0356 03/15/12 0402  NA 132* 132* 134* 132* 129*  K 4.6 4.3 4.4 4.4 4.0  CL 103 102 103 102 96  CO2 23 22 23 21 27   GLUCOSE 160* 199* 149* 223* 150*  BUN 19 20 16 9  7   CREATININE 0.67 0.72 0.75 0.81 0.83  CALCIUM 7.7* 8.0* 8.2* 8.2* 8.2*   CBC:  Lab 03/21/12 0405 03/20/12 1500 03/17/12 0347 03/16/12 0356 03/15/12 0402  WBC 9.5 10.5 9.2 4.3 4.3  HGB 13.6 13.8 13.6 14.2 13.4  HCT 39.4 39.4 38.6* 41.1 37.9*  MCV 91.4 91.4 91.9 93.0 91.5  PLT 193 215 160 144* 123*   CBG:  Lab 03/20/12 2233 03/20/12 1701 03/20/12 0753 03/19/12 1704 03/19/12 0723  GLUCAP 222* 177* 146* 244* 166*    No results found for this or any previous visit (from the past 240 hour(s)).   Studies: No results found.  Scheduled Meds:   . amLODipine  5 mg Oral Daily  . baclofen  5 mg Oral TID  . dexamethasone  4 mg Oral Q6H  . docusate sodium  100 mg Oral BID  . feeding supplement  237 mL Oral BID BM  . insulin aspart  0-15 Units Subcutaneous TID WC  . insulin aspart  0-5 Units Subcutaneous QHS  . LORazepam  1 mg Intravenous Once  . oxyCODONE  20 mg Oral Q12H  . pantoprazole  40 mg Oral Q1200  . polyethylene glycol  17 g Oral Daily  . vancomycin  1,500 mg Intravenous On Call   Continuous Infusions:   . sodium chloride 1,000 mL (03/20/12 1712)

## 2012-03-22 ENCOUNTER — Ambulatory Visit
Admit: 2012-03-22 | Discharge: 2012-03-22 | Disposition: A | Discharge status: Home / Self Care | Payer: BC Managed Care – PPO | Attending: Radiation Oncology | Admitting: Radiation Oncology

## 2012-03-22 ENCOUNTER — Telehealth: Payer: Self-pay | Admitting: *Deleted

## 2012-03-22 DIAGNOSIS — Z510 Encounter for antineoplastic radiation therapy: Secondary | ICD-10-CM | POA: Insufficient documentation

## 2012-03-22 DIAGNOSIS — C9000 Multiple myeloma not having achieved remission: Secondary | ICD-10-CM | POA: Insufficient documentation

## 2012-03-22 LAB — GLUCOSE, CAPILLARY
Glucose-Capillary: 130 mg/dL — ABNORMAL HIGH (ref 70–99)
Glucose-Capillary: 217 mg/dL — ABNORMAL HIGH (ref 70–99)

## 2012-03-22 MED ORDER — ENOXAPARIN SODIUM 60 MG/0.6ML ~~LOC~~ SOLN
60.0000 mg | SUBCUTANEOUS | Status: DC
  Administered 2012-03-22 – 2012-03-24 (×3): 60 mg via SUBCUTANEOUS
  Filled 2012-03-22 (×4): qty 0.6

## 2012-03-22 MED ORDER — DEXAMETHASONE SODIUM PHOSPHATE 4 MG/ML IJ SOLN: 40.0000 mg | INTRAMUSCULAR | Status: DC

## 2012-03-22 MED ORDER — SODIUM CHLORIDE 0.9 % IV SOLN
40.0000 mg | INTRAVENOUS | Status: DC
  Administered 2012-03-22 – 2012-03-23 (×2): 40 mg via INTRAVENOUS
  Filled 2012-03-22 (×2): qty 4

## 2012-03-22 NOTE — Progress Notes (Signed)
Simulation and treatment planning note  Diagnosis: multiple myeloma  The patient is status post vertebroplasty and has been cleared by interventional radiology for radiotherapy.  The patient was laid in the supine position on the treatment table with his arms over his head and legs in an alphacradle. High Resolution CT axial imaging was obtained of the patient's spine. An isocenter was placed in the lower thoracic spine. Skin markings are made and he tolerated the procedure well.  Treatment planning: I plan to treat from the top of T8 through the majority of the L4 with an AP PA arrangement using MLCs for custom blocks. I will prescribe 20 gray in 10 fractions.

## 2012-03-22 NOTE — Progress Notes (Addendum)
Subjective: Pt feeling much better this am. Had a lot of discomfort after procedure yesterday but once he got some ice packs to treatment area, he felt much better. He reports his pain is still present but improved this am. He is now able to roll over in bed and is anxious to get started with therapy to increase his activity. Denies pain or weakness in LE  Objective: Physical Exam: BP 130/87  Pulse 73  Temp 98 F (36.7 C) (Oral)  Resp 18  Ht 5\' 8"  (1.727 m)  Wt 250 lb (113.399 kg)  BMI 38.01 kg/m2  SpO2 93% Treatment area with minimal edema, dressings intact. Minimal tenderness. LE strength 5/5. Normal sensation   Labs: CBC  Basename 03/21/12 0405 03/20/12 1500  WBC 9.5 10.5  HGB 13.6 13.8  HCT 39.4 39.4  PLT 193 215   BMET  Basename 03/21/12 0405 03/20/12 1500  NA 132* 132*  K 4.6 4.3  CL 103 102  CO2 23 22  GLUCOSE 160* 199*  BUN 19 20  CREATININE 0.67 0.72  CALCIUM 7.7* 8.0*   LFT No results found for this basename: PROT,ALBUMIN,AST,ALT,ALKPHOS,BILITOT,BILIDIR,IBILI,LIPASE in the last 72 hours PT/INR No results found for this basename: LABPROT:2,INR:2 in the last 72 hours   Studies/Results: No results found.  Assessment/Plan: Multiple myeloma S/p ablalation and KP augmentation of T9-T12 OK to increase activity with PT, must use RW for ambulation. Radiation therapy to start, pt expresses he would like to review that again before starting. Decadron starting per Onc. Will check again tomorrow.    LOS: 13 days    Brayton El PA-C 03/22/2012 9:34 AM

## 2012-03-22 NOTE — Clinical Social Work Placement (Deleted)
     Clinical Social Work Department CLINICAL SOCIAL WORK PLACEMENT NOTE 03/28/2012  Patient:  Robert Berger, Robert Berger  Account Number:  [id number] Admit date:  03/09/2012  Clinical Social Worker:  Reece Levy, Theresia Majors  Date/time:  03/22/2012 12:53 PM  Clinical Social Work is seeking post-discharge placement for this patient at the following level of care:   SKILLED NURSING   (*CSW will update this form in Epic as items are completed)   03/22/2012  Patient/family provided with [facility] Department of Clinical Social Works list of facilities offering this level of care within the geographic area requested by the patient (or if unable, by the patients family).  03/22/2012  Patient/family informed of their freedom to choose among providers that offer the needed level of care, that participate in Medicare, Medicaid or managed care program needed by the patient, have an available bed and are willing to accept the patient.  03/22/2012  Patient/family informed of [facility] ownership interest in [facility], as well as of the fact that they are under no obligation to receive care at this facility.  PASARR submitted to E[employer] on 03/22/2012 PASARR number received from [employer] on 03/27/2012  FL2 transmitted to all facilities in geographic area requested by pt/family on  03/22/2012 FL2 transmitted to all facilities within larger geographic area on   Patient informed that his/her managed care company has contracts with or will negotiate with  certain facilities, including the following:     Patient/family informed of bed offers received:  03/27/2012 Patient chooses bed at [place], [facility] Physician recommends and patient chooses bed at    Patient to be transferred to [place], [facility] on  03/27/2012 Patient to be transferred to facility by EMS  The following physician request were entered in Epic:   Additional Comments:

## 2012-03-22 NOTE — Progress Notes (Signed)
Robert Berger had kyphoplasty yesterday. Interventional radiology the great job. It looks like he has several levels done.  He can now go for radiation therapy. I'll put him on some high-dose Decadron. This is toxic for myeloma. I will put him on 40 mg a day for 4 days. Will then get back on low-dose Decadron during his radiation.  He hopefully will be able to be ambulatory. Hopefully physical therapy will be able to work with him. His lab work has been okay. His blood sugars will need to be watched as they are already.  His vital signs are all stable. There is no changes in his physical exam.  We will continue to follow along. I may consider a dose of Velcade this week which is given subcutaneously.  I think the next issue is discharge planning a lot will depend on his ambulatory status.  Robert E.  Robert Berger 1:9

## 2012-03-22 NOTE — Progress Notes (Signed)
[facility]                                                                                Patient Demographics  Robert Berger, is a 71 y.o. male  GNF:[id number]  QIO:[id number]  DOB - 09/28/1940  Admit date - 03/09/2012  Admitting Physician Zannie Cove, MD  Outpatient Primary MD for the patient is [name], MD  LOS - 13   Chief Complaint  Patient presents with  . Abdominal Pain  . Constipation        Assessment & Plan   Brief narrative:  71 year old man who presented to the hospital on 03/09/12 with a 1 week history of abdominal pain, back pain and constipation. CT scan findings showed expansile lytic lesions involving thoracic and lumbar spine The patient underwent CT guided biopsy of T12 lesion with pathology findings consistent with plasma cell disorder. Diagnostic bone marrow biopsy subsequently done with findings of 11% plasma cells. Bone marrow aspirate for cytogenics and myeloma FISH panel are still pending.   Post ablation/vertebroplasty procedure of T9-T12 03/21/2012 on 03/21/2012 by IR. PT to evaluate today   Back pain secondary to IgG kappa multiple myeloma  Per oncology patient needs local therapy for pain control before chemotherapy he is status post kyphoplasty on 03/21/2012 have informed radiation oncology to start as needed, discussed with Dr. Myna Hidalgo patient's oncologist on 03/22/2012 Patient was already given Zometa 03/13/2012 but the plan is to perhaps give it again to prevent skeletal related events but prior to that he would need dental evaluation outpatient as zometa can cause osteonecrosis of the jaw and this patient already has a poor dentition  Decadron dose has been increased by Dr. 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  Thyroid ultrasound done 03/11/12, shows  multiple nodules  status post biopsy- non neoplastic goiter    Hypertension  Stable on 5 mg of daily Norvasc    Hyperglycemia  Steroid induced  Started on sliding scale insulin TIDAC and HS   CBG (last 3)   Basename 03/22/12 0736 03/21/12 2141 03/21/12 1653  GLUCAP 130* 158* 132*      Hiatal hernia and fluid filled esophagus / GERD  Continue Protonix daily    Abdominal pain  Secondary to radiating pain from back related to multiple myeloma  Patient has now had bowel movement on daily basis; continue current bowel regimen Pain-free today      Code Status: Full  Family Communication: Discussed with patient  Disposition Plan: Likely home    Procedures kyphoplasty on 03/21/2012    Consults  oncology, radiation oncology, IR for kyphoplasty   Antibiotics  none   Time Spent in minutes  35   DVT Prophylaxis  Lovenox    Leroy Sea M.D on 03/22/2012 at 12:16 PM  Between 7am to 7pm - Pager - [phone]  After 7pm go to [email or web address] - password [email or web address]  And look for the night coverage person covering for me after hours  [facility] Office  [phone]    Subjective:   [name] today has, No headache, No chest pain, No abdominal pain - No Nausea, No new weakness tingling or numbness, No Cough - SOB. Back pain is much improved Objective:   Filed Vitals:   03/21/12 1459 03/22/12 0108 03/22/12 0557 03/22/12 1033  BP:  138/80 130/87 146/71  Pulse: 74 89 73   Temp:  97.6 F (36.4 C) 98 F (36.7 C)   TempSrc:  Oral Oral   Resp: 21 19 18    Height:      Weight:      SpO2: 99% 95% 93%     Wt Readings from Last 3 Encounters:  03/09/12 113.399 kg (250 lb)  03/09/12 113.399 kg (250 lb)  03/09/12 113.399 kg (250 lb)     Intake/Output Summary (Last 24 hours) at 03/22/12 1216 Last data filed at 03/22/12 0700  Gross per 24 hour  Intake   1150 ml  Output   1350 ml  Net   -200 ml    Exam Awake Alert, Oriented X 3, No  new F.N deficits, Normal affect [place].AT,PERRAL Supple Neck,No JVD, No cervical lymphadenopathy appriciated.  Symmetrical Chest wall movement, Good air movement bilaterally, CTAB RRR,No Gallops,Rubs or new Murmurs, No Parasternal Heave +ve B.Sounds, Abd Soft, Non tender, No organomegaly appriciated, No rebound - guarding or rigidity. No Cyanosis, Clubbing or edema, No new Rash or bruise     Data Review   CBC  Lab 03/21/12 0405 03/20/12 1500 03/17/12 0347 03/16/12 0356  WBC 9.5 10.5 9.2 4.3  HGB 13.6 13.8 13.6 14.2  HCT 39.4 39.4 38.6* 41.1  PLT 193 215 160 144*  MCV 91.4 91.4 91.9 93.0  MCH 31.6 32.0 32.4 32.1  MCHC 34.5 35.0 35.2 34.5  RDW 13.8 13.9 14.3 14.0  LYMPHSABS -- -- -- --  MONOABS -- -- -- --  EOSABS -- -- -- --  BASOSABS -- -- -- --  BANDABS -- -- -- --    Chemistries   Lab 03/21/12 0405 03/20/12 1500 03/17/12 0347 03/16/12 0356  NA 132* 132* 134* 132*  K 4.6 4.3 4.4 4.4  CL 103 102 103 102  CO2 23 22 23 21   GLUCOSE 160* 199* 149* 223*  BUN 19 20 16 9   CREATININE 0.67 0.72 0.75 0.81  CALCIUM 7.7* 8.0* 8.2* 8.2*  MG -- -- -- --  AST -- -- -- --  ALT -- -- -- --  ALKPHOS -- -- -- --  BILITOT -- -- -- --   ------------------------------------------------------------------------------------------------------------------ estimated creatinine clearance is 103.5 ml/min (by C-G formula based on Cr of 0.67). ------------------------------------------------------------------------------------------------------------------ No results found for this basename: HGBA1C:2 in the last 72 hours ------------------------------------------------------------------------------------------------------------------ No results found for this basename: CHOL:2,HDL:2,LDLCALC:2,TRIG:2,CHOLHDL:2,LDLDIRECT:2 in the last 72 hours ------------------------------------------------------------------------------------------------------------------ No results found for this basename:  TSH,T4TOTAL,FREET3,T3FREE,THYROIDAB in the last 72 hours ------------------------------------------------------------------------------------------------------------------ No results found for this basename: VITAMINB12:2,FOLATE:2,FERRITIN:2,TIBC:2,IRON:2,RETICCTPCT:2 in the last 72 hours  Coagulation profile No results found for this basename: INR:5,PROTIME:5 in the last 168 hours  No results found for this basename: DDIMER:2 in the last 72 hours  Cardiac Enzymes No results found for this basename: CK:3,CKMB:3,TROPONINI:3,MYOGLOBIN:3 in the last 168 hours ------------------------------------------------------------------------------------------------------------------ No components found with this basename: POCBNP:3  Micro Results No results found for this or any previous visit (from the past 240 hour(s)).  Radiology Reports X-ray Chest Pa And Lateral   03/09/2012  *RADIOLOGY REPORT*  Clinical Data: No chest complaints.  Osseous lytic lesions on CT  of the abdomen.  CHEST - 2 VIEW  Comparison: Abdominal pelvic CT of earlier today.  Findings: Mild osteopenia.  Maintenance of thoracic vertebral body height.  Suspect ankylosing spondylitis or diffuse idiopathic skeletal hyperostosis involving the thoracic spine.  Low-volume AP frontal view.  Patient minimally rotated to the right. Normal heart size.  Normal mediastinal contours. No pleural effusion or pneumothorax.  Mildly low lung volumes. Clear lungs.  No other focal osseous lytic lesions are identified.  IMPRESSION: Low lung volumes without acute disease.  Original Report Authenticated By: Consuello Bossier, M.D.   Ct Chest W Contrast  03/10/2012  *RADIOLOGY REPORT*  Clinical Data: Lung cancer.  Lytic bone lesion.  Evaluate for primary.  CT CHEST WITH CONTRAST  Technique:  Multidetector CT imaging of the chest was performed following the standard protocol during bolus administration of intravenous contrast.  Contrast: 80mL OMNIPAQUE IOHEXOL 300 MG/ML   SOLN  Comparison: CT abdomen and bone scan.  Findings: Enlargement of the left thyroid lobe is present, incompletely visualized.  This probably represents thyroid goiter. Coarse calcifications are present in the enlarged left thyroid lobe.  There is no axillary adenopathy.  No mediastinal or hilar adenopathy. Coronary artery atherosclerosis is present. If office based assessment of coronary risk factors has not been performed, it is now recommended.  Heart grossly appears within normal limits otherwise.  No pericardial effusion.  No pleural effusion.  Dependent atelectasis in the left lung.  Medial right upper lobe atelectasis or scarring with collapse along the right vertebral column.  Incidental imaging of the abdomen appears similar to the recent prior.  Fluid distends the esophagus.  Small hiatal hernia. Small calcified granuloma is present in the lateral right lower lobe (image 36 series 5).  There is a lytic lesion in the right sternal manubrium (image 11 series 5) measuring 15 mm.  Soft tissue density lesions also occupy the T9 and T10 vertebral bodies, best seen on sagittal imaging. There may be some right-sided extraosseous extension at T10.  No definite soft tissue mass in the central canal.  No destructive rib lesion is identified on the right to account for focus of increased radiotracer uptake and this may be secondary to degenerative disease.  Thoracic spine DISH.  IMPRESSION: 1.  Negative for lung cancer. 2.  Soft tissue density osteolytic lesions occupying T9 and T10 vertebral bodies, with possible small amount of extraosseous extension to the right of T10.  Although nonspecific, lymphoma, myeloma and metastatic disease of the primary considerations. Small metastatic lesion in the right sternal manubrium also present. 3.  Small hiatal hernia and fluid distended esophagus.  There may be some esophageal thickening distally.  Consider follow-up endoscopy.  Original Report Authenticated By: Andreas Newport, M.D.   Mr Cervical Spine Wo Contrast  03/14/2012  *RADIOLOGY REPORT*  Clinical Data:  Lytic lesions noted on CT.  Possible myeloma.  Back pain.  MRI CERVICAL AND THORACIC SPINE WITHOUT CONTRAST  Technique:  Multiplanar and multiecho pulse sequences of the cervical spine, to include the craniocervical junction and cervicothoracic junction, and the thoracic spine, were obtained without intravenous contrast.  Comparison:  03/09/2012 CT of the abdomen and pelvis.  08/02 03/28/2012 CT of the chest.  03/10/2012 bone scan.  The present examination was ordered as a cervical, thoracic and lumbar spine MR with contrast however, the patient was not able to tolerate imaging and only a motion degraded MR of the cervical and thoracic spine without contrast was not able to be obtained.  MRI CERVICAL SPINE  Findings:  Motion degraded exam.  Subtle altered signal intensity of bone marrow throughout the cervical spine raises possibility of involvement by tumor although less pronounced than seen in the thoracic region.  No obvious epidural extension of tumor in the region of the cervical spine.  Small areas of altered signal intensity suggest tumor involvement of portions of the clivus and occipital bone.  C5-6:  Broad-based disc osteophyte complex greater to the right with mild spinal stenosis and mild cord contact.  Uncinate bony overgrowth with moderate bilateral foraminal narrowing greater on the right.  C6-7:  Bulge/osteophyte greater to the right.  Mild spinal stenosis.  Minimal cord contact.  Mild bilateral foraminal narrowing.  IMPRESSION: Motion degraded exam.  Subtle altered signal intensity of bone marrow throughout the cervical spine raises possibility of involvement by tumor although less pronounced than seen in the thoracic region.  No obvious epidural extension of tumor in the region of the cervical spine.  Small areas of altered signal intensity suggest tumor involvement of portions of the clivus and occipital  bone.  Degenerative changes C5-6 and C6-7 as noted above.  MRI THORACIC SPINE  Small areas of altered signal intensity involving portions of T 3 through T8 vertebral body and left T6 facet suggestive of involvement by tumor.  Tumor involvement of a majority of the T9 and T10 vertebral body with extension into the T8-9 disc space and superior aspect of the T11 vertebral body. T9 and T10 tumor involvement extends into the posterior elements and there is mild bony expansion of the posterior aspect of the diseased vertebra greater to left midline without significant spinal stenosis or cord compression. Paraspinal extension tumor extends in the anterior lateral position bilaterally.  Destructive lesion T12 vertebra with 15% loss of height. Epidural extension of tumor with spinal stenosis and crowding of the distal cord / conus.  Extension of tumor into the neural foramen greater at the T12-L1 level with small amount of tumor extension into the T11-12 neural foramen.  Abnormal appearance of the ribs bilaterally.  This may represent result of anemia.  IMPRESSION: Destructive lesion T12 vertebra with epidural and foraminal extension of tumor as detailed above.  This causes spinal stenosis and crowding of the distal cord / conus.  Tumor involvement from the lower T8 to upper T11 with mild bony expansion of the  posterior aspect of the diseased T9 and T10 vertebral body but without cord compression.  Small areas of tumor involvement T3-T8.  Please see above for further detail.  This has been made a PRA call report utilizing dashboard call feature.  Original Report Authenticated By: Fuller Canada, M.D.   Mr Cervical Spine W Contrast  03/15/2012  *RADIOLOGY REPORT*  Clinical Data:  Back pain.  Possible myeloma.  MRI CERVICAL AND THORACIC SPINE WITH CONTRAST  Technique:  Multiplanar and multiecho pulse sequences of the cervical spine, to include the craniocervical junction and cervicothoracic junction, and the thoracic  spine, were obtained with contrast.  Contrast: 20mL MULTIHANCE GADOBENATE DIMEGLUMINE 529 MG/ML IV SOLN  Comparison:  Precontrast MR of the cervical and thoracic spine performed 03/14/2012.  MRI CERVICAL SPINE  Findings:  Motion degraded exam.  Abnormal areas of enhancement consistent with involvement by tumor involving portions of the clivus, occipital condyles and all of the cervical vertebra.  Most prominent involvement is that involving the C2, C3 and C5 vertebral body as well as the right C5 facet and left C7 facet.  No obvious epidural extension  of tumor noted on this motion degraded examination nor is there evidence of foraminal extension.  Regions of altered signal intensity involving portions of the cervical cord at the C3 and C5-6 level probably related to motion artifact rather than  involvement by tumor.  Enlarged left thyroid gland.  Tumor not excluded.  Ultrasound can be obtained for further delineation.  IMPRESSION: Motion degraded exam.  Abnormal areas of enhancement consistent with involvement by tumor involving portions of the clivus, occipital condyles and all of the cervical vertebra.  Most prominent involvement is that involving the C2, C3 and C5 vertebral body as well as the right C5 facet and left C7 facet.  No obvious epidural extension of tumor noted on this motion degraded examination nor is there evidence of foraminal extension.  Enlarged left thyroid gland.  Tumor not excluded.  Ultrasound can be obtained for further delineation.  MRI THORACIC SPINE  Findings: Only a significantly motion degraded sagittal post contrast examination of the thoracic spine was able to be obtained secondary to patient discomfort. Tumor involvement of T12 vertebra is better delineated on the lumbar spine MR.  Diffuse tumor involvement extends from the inferior T8 plate through the upper T11 end plate involving a majority of the T9 and T10 vertebral body extending into the posterior elements with mild expansion  of the diseased vertebra contributing to the mild spinal stenosis.  Epidural extension of tumor/engorged veins greater on the right extending from T9 to below T12 (most notable epidural tumor at the T12 level).  Diffuse tumor involvement of the T12 vertebral body involving posterior elements with loss of height centrally inferior aspect. Epidural extension of tumor with crowding of the distal cord / conus.  Foraminal extension of epidural disease more notable T12-L1 then at the T11-T12 level with encroachment upon the exiting T12 nerve roots.  Small areas of tumor involvement T3 through T8 vertebra including T3 and T4 spinous process, left T6 facet and left T8 facet.  IMPRESSION: Only a significantly motion degraded sagittal post contrast examination of the thoracic spine was able to be obtained.  Diffuse tumor involvement extends from the inferior T8 plate through the upper T11 end plate involving a majority of the T9 and T10 vertebral body extending into the posterior elements with mild expansion of the diseased vertebra contributing to the mild spinal stenosis.  Epidural extension of tumor/engorged veins greater on the right extending from T9 to below T12 (most notable epidural tumor at the T12 level).  Diffuse tumor involvement of the T12 vertebral body involving posterior elements with loss of height centrally inferior aspect. Epidural extension of tumor with crowding of the distal cord / conus.  Foraminal extension of epidural disease more notable T12-L1 then at the T11-T12 level with encroachment upon the exiting T12 nerve roots.  Small areas of tumor involvement T3 through T8 vertebra including T3 and T4 spinous process, left T6 facet and left T8 facet.  MRI LUMBAR SPINE WITHOUT AND WITH CONTRAST  Technique:  Multiplanar and multiecho pulse sequences of the lumbar spine were obtained without and with intravenous contrast.  Contrast: 20mL MULTIHANCE GADOBENATE DIMEGLUMINE 529 MG/ML IV SOLN  Comparison:  CT  abdomen and pelvis 03/09/2012.  Findings:  Tumor involvement of all of the visualized vertebra from L1 through the lowest aspect of the sacrum imaged (S3). Tumor involvement of the ilium bilaterally. Most notable tumor involvement L3 vertebral body and right aspect of the sacral ala.  No epidural extension of tumor in the lumbar-sacral spine.  Conus L1 level.  L1-2:  Right posterior lateral/paracentral protrusion.  Slight crowding of the exiting right L1 nerve root and mild impression upon the right ventral aspect of the thecal sac.  L2-3:  Broad-based disc osteophyte complex greater right lateral position with slight impression upon the exiting right L2 nerve root.  Facet joint degenerative changes.  L3-4:  Baseline broad-based disc osteophyte complex.  Lateral extension touches but does not compress the exiting L3 nerve roots. Small central disc protrusion with minimal indentation upon the thecal sac.  Mild facet joint degenerative changes.  L4-5:  Small to moderate sized broad-based disc protrusion greater to the left.  Left greater than right lateral recess stenosis with mild spinal stenosis.  L5-S1:  Minimal bulge.  IMPRESSION: Tumor involvement of all of the visualized vertebra from L1 through the lowest aspect of the sacrum imaged (S3). Tumor involvement of the ilium bilaterally. Most notable tumor involvement L3 vertebral body and right aspect of the sacral ala.  No epidural extension of tumor in the lumbar-sacral spine.  Lumbar spine degenerative changes as detailed above.  Original Report Authenticated By: Fuller Canada, M.D.   Mr Thoracic Spine Wo Contrast  03/14/2012  *RADIOLOGY REPORT*  Clinical Data:  Lytic lesions noted on CT.  Possible myeloma.  Back pain.  MRI CERVICAL AND THORACIC SPINE WITHOUT CONTRAST  Technique:  Multiplanar and multiecho pulse sequences of the cervical spine, to include the craniocervical junction and cervicothoracic junction, and the thoracic spine, were obtained without  intravenous contrast.  Comparison:  03/09/2012 CT of the abdomen and pelvis.  08/02 03/28/2012 CT of the chest.  03/10/2012 bone scan.  The present examination was ordered as a cervical, thoracic and lumbar spine MR with contrast however, the patient was not able to tolerate imaging and only a motion degraded MR of the cervical and thoracic spine without contrast was not able to be obtained.  MRI CERVICAL SPINE  Findings:  Motion degraded exam.  Subtle altered signal intensity of bone marrow throughout the cervical spine raises possibility of involvement by tumor although less pronounced than seen in the thoracic region.  No obvious epidural extension of tumor in the region of the cervical spine.  Small areas of altered signal intensity suggest tumor involvement of portions of the clivus and occipital bone.  C5-6:  Broad-based disc osteophyte complex greater to the right with mild spinal stenosis and mild cord contact.  Uncinate bony overgrowth with moderate bilateral foraminal narrowing greater on the right.  C6-7:  Bulge/osteophyte greater to the right.  Mild spinal stenosis.  Minimal cord contact.  Mild bilateral foraminal narrowing.  IMPRESSION: Motion degraded exam.  Subtle altered signal intensity of bone marrow throughout the cervical spine raises possibility of involvement by tumor although less pronounced than seen in the thoracic region.  No obvious epidural extension of tumor in the region of the cervical spine.  Small areas of altered signal intensity suggest tumor involvement of portions of the clivus and occipital bone.  Degenerative changes C5-6 and C6-7 as noted above.  MRI THORACIC SPINE  Small areas of altered signal intensity involving portions of T 3 through T8 vertebral body and left T6 facet suggestive of involvement by tumor.  Tumor involvement of a majority of the T9 and T10 vertebral body with extension into the T8-9 disc space and superior aspect of the T11 vertebral body. T9 and T10 tumor  involvement extends into the posterior elements and there is mild bony expansion of the posterior aspect of  the diseased vertebra greater to left midline without significant spinal stenosis or cord compression. Paraspinal extension tumor extends in the anterior lateral position bilaterally.  Destructive lesion T12 vertebra with 15% loss of height. Epidural extension of tumor with spinal stenosis and crowding of the distal cord / conus.  Extension of tumor into the neural foramen greater at the T12-L1 level with small amount of tumor extension into the T11-12 neural foramen.  Abnormal appearance of the ribs bilaterally.  This may represent result of anemia.  IMPRESSION: Destructive lesion T12 vertebra with epidural and foraminal extension of tumor as detailed above.  This causes spinal stenosis and crowding of the distal cord / conus.  Tumor involvement from the lower T8 to upper T11 with mild bony expansion of the  posterior aspect of the diseased T9 and T10 vertebral body but without cord compression.  Small areas of tumor involvement T3-T8.  Please see above for further detail.  This has been made a PRA call report utilizing dashboard call feature.  Original Report Authenticated By: Fuller Canada, M.D.   Mr Thoracic Spine W Contrast  03/15/2012  *RADIOLOGY REPORT*  Clinical Data:  Back pain.  Possible myeloma.  MRI CERVICAL AND THORACIC SPINE WITH CONTRAST  Technique:  Multiplanar and multiecho pulse sequences of the cervical spine, to include the craniocervical junction and cervicothoracic junction, and the thoracic spine, were obtained with contrast.  Contrast: 20mL MULTIHANCE GADOBENATE DIMEGLUMINE 529 MG/ML IV SOLN  Comparison:  Precontrast MR of the cervical and thoracic spine performed 03/14/2012.  MRI CERVICAL SPINE  Findings:  Motion degraded exam.  Abnormal areas of enhancement consistent with involvement by tumor involving portions of the clivus, occipital condyles and all of the cervical vertebra.   Most prominent involvement is that involving the C2, C3 and C5 vertebral body as well as the right C5 facet and left C7 facet.  No obvious epidural extension of tumor noted on this motion degraded examination nor is there evidence of foraminal extension.  Regions of altered signal intensity involving portions of the cervical cord at the C3 and C5-6 level probably related to motion artifact rather than  involvement by tumor.  Enlarged left thyroid gland.  Tumor not excluded.  Ultrasound can be obtained for further delineation.  IMPRESSION: Motion degraded exam.  Abnormal areas of enhancement consistent with involvement by tumor involving portions of the clivus, occipital condyles and all of the cervical vertebra.  Most prominent involvement is that involving the C2, C3 and C5 vertebral body as well as the right C5 facet and left C7 facet.  No obvious epidural extension of tumor noted on this motion degraded examination nor is there evidence of foraminal extension.  Enlarged left thyroid gland.  Tumor not excluded.  Ultrasound can be obtained for further delineation.  MRI THORACIC SPINE  Findings: Only a significantly motion degraded sagittal post contrast examination of the thoracic spine was able to be obtained secondary to patient discomfort. Tumor involvement of T12 vertebra is better delineated on the lumbar spine MR.  Diffuse tumor involvement extends from the inferior T8 plate through the upper T11 end plate involving a majority of the T9 and T10 vertebral body extending into the posterior elements with mild expansion of the diseased vertebra contributing to the mild spinal stenosis.  Epidural extension of tumor/engorged veins greater on the right extending from T9 to below T12 (most notable epidural tumor at the T12 level).  Diffuse tumor involvement of the T12 vertebral body involving posterior elements with  loss of height centrally inferior aspect. Epidural extension of tumor with crowding of the distal cord  / conus.  Foraminal extension of epidural disease more notable T12-L1 then at the T11-T12 level with encroachment upon the exiting T12 nerve roots.  Small areas of tumor involvement T3 through T8 vertebra including T3 and T4 spinous process, left T6 facet and left T8 facet.  IMPRESSION: Only a significantly motion degraded sagittal post contrast examination of the thoracic spine was able to be obtained.  Diffuse tumor involvement extends from the inferior T8 plate through the upper T11 end plate involving a majority of the T9 and T10 vertebral body extending into the posterior elements with mild expansion of the diseased vertebra contributing to the mild spinal stenosis.  Epidural extension of tumor/engorged veins greater on the right extending from T9 to below T12 (most notable epidural tumor at the T12 level).  Diffuse tumor involvement of the T12 vertebral body involving posterior elements with loss of height centrally inferior aspect. Epidural extension of tumor with crowding of the distal cord / conus.  Foraminal extension of epidural disease more notable T12-L1 then at the T11-T12 level with encroachment upon the exiting T12 nerve roots.  Small areas of tumor involvement T3 through T8 vertebra including T3 and T4 spinous process, left T6 facet and left T8 facet.  MRI LUMBAR SPINE WITHOUT AND WITH CONTRAST  Technique:  Multiplanar and multiecho pulse sequences of the lumbar spine were obtained without and with intravenous contrast.  Contrast: 20mL MULTIHANCE GADOBENATE DIMEGLUMINE 529 MG/ML IV SOLN  Comparison:  CT abdomen and pelvis 03/09/2012.  Findings:  Tumor involvement of all of the visualized vertebra from L1 through the lowest aspect of the sacrum imaged (S3). Tumor involvement of the ilium bilaterally. Most notable tumor involvement L3 vertebral body and right aspect of the sacral ala.  No epidural extension of tumor in the lumbar-sacral spine.  Conus L1 level.  L1-2:  Right posterior  lateral/paracentral protrusion.  Slight crowding of the exiting right L1 nerve root and mild impression upon the right ventral aspect of the thecal sac.  L2-3:  Broad-based disc osteophyte complex greater right lateral position with slight impression upon the exiting right L2 nerve root.  Facet joint degenerative changes.  L3-4:  Baseline broad-based disc osteophyte complex.  Lateral extension touches but does not compress the exiting L3 nerve roots. Small central disc protrusion with minimal indentation upon the thecal sac.  Mild facet joint degenerative changes.  L4-5:  Small to moderate sized broad-based disc protrusion greater to the left.  Left greater than right lateral recess stenosis with mild spinal stenosis.  L5-S1:  Minimal bulge.  IMPRESSION: Tumor involvement of all of the visualized vertebra from L1 through the lowest aspect of the sacrum imaged (S3). Tumor involvement of the ilium bilaterally. Most notable tumor involvement L3 vertebral body and right aspect of the sacral ala.  No epidural extension of tumor in the lumbar-sacral spine.  Lumbar spine degenerative changes as detailed above.  Original Report Authenticated By: Fuller Canada, M.D.   Mr Lumbar Spine W Wo Contrast  03/15/2012  *RADIOLOGY REPORT*  Clinical Data:  Back pain.  Possible myeloma.  MRI CERVICAL AND THORACIC SPINE WITH CONTRAST  Technique:  Multiplanar and multiecho pulse sequences of the cervical spine, to include the craniocervical junction and cervicothoracic junction, and the thoracic spine, were obtained with contrast.  Contrast: 20mL MULTIHANCE GADOBENATE DIMEGLUMINE 529 MG/ML IV SOLN  Comparison:  Precontrast MR of the cervical and thoracic  spine performed 03/14/2012.  MRI CERVICAL SPINE  Findings:  Motion degraded exam.  Abnormal areas of enhancement consistent with involvement by tumor involving portions of the clivus, occipital condyles and all of the cervical vertebra.  Most prominent involvement is that involving  the C2, C3 and C5 vertebral body as well as the right C5 facet and left C7 facet.  No obvious epidural extension of tumor noted on this motion degraded examination nor is there evidence of foraminal extension.  Regions of altered signal intensity involving portions of the cervical cord at the C3 and C5-6 level probably related to motion artifact rather than  involvement by tumor.  Enlarged left thyroid gland.  Tumor not excluded.  Ultrasound can be obtained for further delineation.  IMPRESSION: Motion degraded exam.  Abnormal areas of enhancement consistent with involvement by tumor involving portions of the clivus, occipital condyles and all of the cervical vertebra.  Most prominent involvement is that involving the C2, C3 and C5 vertebral body as well as the right C5 facet and left C7 facet.  No obvious epidural extension of tumor noted on this motion degraded examination nor is there evidence of foraminal extension.  Enlarged left thyroid gland.  Tumor not excluded.  Ultrasound can be obtained for further delineation.  MRI THORACIC SPINE  Findings: Only a significantly motion degraded sagittal post contrast examination of the thoracic spine was able to be obtained secondary to patient discomfort. Tumor involvement of T12 vertebra is better delineated on the lumbar spine MR.  Diffuse tumor involvement extends from the inferior T8 plate through the upper T11 end plate involving a majority of the T9 and T10 vertebral body extending into the posterior elements with mild expansion of the diseased vertebra contributing to the mild spinal stenosis.  Epidural extension of tumor/engorged veins greater on the right extending from T9 to below T12 (most notable epidural tumor at the T12 level).  Diffuse tumor involvement of the T12 vertebral body involving posterior elements with loss of height centrally inferior aspect. Epidural extension of tumor with crowding of the distal cord / conus.  Foraminal extension of epidural  disease more notable T12-L1 then at the T11-T12 level with encroachment upon the exiting T12 nerve roots.  Small areas of tumor involvement T3 through T8 vertebra including T3 and T4 spinous process, left T6 facet and left T8 facet.  IMPRESSION: Only a significantly motion degraded sagittal post contrast examination of the thoracic spine was able to be obtained.  Diffuse tumor involvement extends from the inferior T8 plate through the upper T11 end plate involving a majority of the T9 and T10 vertebral body extending into the posterior elements with mild expansion of the diseased vertebra contributing to the mild spinal stenosis.  Epidural extension of tumor/engorged veins greater on the right extending from T9 to below T12 (most notable epidural tumor at the T12 level).  Diffuse tumor involvement of the T12 vertebral body involving posterior elements with loss of height centrally inferior aspect. Epidural extension of tumor with crowding of the distal cord / conus.  Foraminal extension of epidural disease more notable T12-L1 then at the T11-T12 level with encroachment upon the exiting T12 nerve roots.  Small areas of tumor involvement T3 through T8 vertebra including T3 and T4 spinous process, left T6 facet and left T8 facet.  MRI LUMBAR SPINE WITHOUT AND WITH CONTRAST  Technique:  Multiplanar and multiecho pulse sequences of the lumbar spine were obtained without and with intravenous contrast.  Contrast: 20mL MULTIHANCE GADOBENATE  DIMEGLUMINE 529 MG/ML IV SOLN  Comparison:  CT abdomen and pelvis 03/09/2012.  Findings:  Tumor involvement of all of the visualized vertebra from L1 through the lowest aspect of the sacrum imaged (S3). Tumor involvement of the ilium bilaterally. Most notable tumor involvement L3 vertebral body and right aspect of the sacral ala.  No epidural extension of tumor in the lumbar-sacral spine.  Conus L1 level.  L1-2:  Right posterior lateral/paracentral protrusion.  Slight crowding of the  exiting right L1 nerve root and mild impression upon the right ventral aspect of the thecal sac.  L2-3:  Broad-based disc osteophyte complex greater right lateral position with slight impression upon the exiting right L2 nerve root.  Facet joint degenerative changes.  L3-4:  Baseline broad-based disc osteophyte complex.  Lateral extension touches but does not compress the exiting L3 nerve roots. Small central disc protrusion with minimal indentation upon the thecal sac.  Mild facet joint degenerative changes.  L4-5:  Small to moderate sized broad-based disc protrusion greater to the left.  Left greater than right lateral recess stenosis with mild spinal stenosis.  L5-S1:  Minimal bulge.  IMPRESSION: Tumor involvement of all of the visualized vertebra from L1 through the lowest aspect of the sacrum imaged (S3). Tumor involvement of the ilium bilaterally. Most notable tumor involvement L3 vertebral body and right aspect of the sacral ala.  No epidural extension of tumor in the lumbar-sacral spine.  Lumbar spine degenerative changes as detailed above.  Original Report Authenticated By: Fuller Canada, M.D.   Nm Bone Scan Whole Body  03/10/2012  *RADIOLOGY REPORT*  Clinical Data: Lumbar spine pain.  Evaluate lytic lesions.  NUCLEAR MEDICINE WHOLE BODY BONE SCINTIGRAPHY  Technique:  Whole body anterior and posterior images were obtained approximately 3 hours after intravenous injection of radiopharmaceutical.  Radiopharmaceutical: CURIE TC-MDP TECHNETIUM TC 32M MEDRONATE IV KIT  Comparison: CT 03/09/2012.  Findings: Increased uptake is present in the T12 vertebra, greater on the right than left.  This corresponds with the lytic lesions seen on recent CT.  This is best seen on the posterior views with increased uptake projected over the pedicles and extension into the posterior elements.  Degenerative changes are present at the knees, shoulders, and ankles.  There is a small right posterior focus of increased  uptake in the right ribs, apparently in the right posterior ninth rib. This could be degenerative or represent a tiny focus of metastatic disease not visualized on prior CT.  Retrospective review of the CT was performed and no definite lytic lesion was present.  IMPRESSION: Increased radiotracer uptake in the T12 vertebra corresponds with the lytic lesion on recent CT, most compatible with neoplasm, likely metastatic disease or myeloma.  Original Report Authenticated By: Andreas Newport, M.D.   US Soft Tissue Head/neck  03/11/2012  *RADIOLOGY REPORT*  Clinical Data: Asymmetric enlargement on recent CT  THYROID ULTRASOUND  Technique: Ultrasound examination of the thyroid gland and adjacent soft tissues was performed.  Comparison:  CT 03/10/2012  Findings:  Right thyroid lobe:  24 x 26 x 56 mm, inhomogeneous Left thyroid lobe:  24 x 35 x 59 mm Isthmus:  5 mm in thickness  Focal nodules:  7 x 8 x 10 mm complex mostly solid, mid-right 7 x 10 x 15 mm complex mostly solid, inferior right 8 x 12 x 14 mm solid, superior left 16 x 19 x 23 mm complex mostly solid, mid-left 12 x 14 x 14 mm hypoechoic solid, inferior left There  are least three additional smaller bilateral and isthmic nodules measuring less than 1 cm.  Lymphadenopathy:  None visualized.  IMPRESSION:  Multiple bilateral thyroid nodules, dominant lesion on the left. Findings meet consensus criteria for biopsy.  Ultrasound-guided fine needle aspiration should be considered, as per the consensus statement: Management of Thyroid Nodules Detected at [place]:  Society of Radiologists in Ultrasound Consensus Conference Statement. Radiology 2005; [id number].  Original Report Authenticated By: Osa Craver, M.D.   Ct Abdomen Pelvis W Contrast  03/09/2012  *RADIOLOGY REPORT*  Clinical Data: LLQ pain, constipation  CT ABDOMEN AND PELVIS WITH CONTRAST  Technique:  Multidetector CT imaging of the abdomen and pelvis was performed following the standard protocol during  bolus administration of intravenous contrast.  Contrast: OMNIPAQUE IOHEXOL 300 MG/ML  SOLN  Comparison: None.  Findings: Lung bases are essentially clear.  Small hiatal hernia.  Liver, pancreas, and adrenal glands are within normal limits.  Gallbladder is mildly distended but without associated inflammatory changes by CT.  No intrahepatic or extrahepatic ductal dilatation.  Kidneys are within normal limits.  No hydronephrosis.  No evidence of bowel obstruction.  Normal appendix.  Colonic diverticulosis, without associated inflammatory changes.  No suspicious abdominopelvic lymphadenopathy.  Prostatomegaly, measuring 6.1 cm in transverse dimension.  Bladder is unremarkable.  Expansile lytic lesion within the left posterior T12 vertebral body (series 2/image 26) with suspected cortical disruption posteriorly (sagittal image 69).  Additional suspected lytic lesions anteriorly in the L1 vertebral body (sagittal image 66) and posteriorly in the L3 vertebral body (sagittal image 63).  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  IMPRESSION: No evidence of bowel obstruction.  Normal appendix.  Colonic diverticulosis, without associated inflammatory changes.  No CT findings to account for the patient's abdominal pain.  Expansile lytic lesion within the left T12 vertebral body with suspected cortical disruption posteriorly.  Additional suspected lytic lesions and L1 and L3.  Primary differential considerations are metastases and myeloma.  Original Report Authenticated By: Charline Bills, M.D.   US Thyroid Biopsy  03/13/2012  *RADIOLOGY REPORT*  Clinical Data/Indication: Dominant left thyroid nodule  ULTRASOUND-GUIDED BIOPSY OF A DOMINANT LEFT THYROID NODULE.  FINE NEEDLE ASPIRATION.  Procedure: The procedure, risks, benefits, and alternatives were explained to the patient. Questions regarding the procedure were encouraged and answered. The patient  understands and consents to the procedure.  The neck was prepped with betadine in a sterile fashion, and a sterile drape was applied covering the operative field. A mask and sterile gloves were used for the procedure.  Under sonographic guidance, three 25 gauge fine needle aspirates of the dominant left thyroid nodule were obtained. Final imaging was performed.  Findings: The images document guide needle placement within the abdominal left thyroid nodule. Post biopsy images demonstrate no hemorrhage.  IMPRESSION: Successful ultrasound-guided fine needle aspiration of a dominant left thyroid nodule.  Original Report Authenticated By: Donavan Burnet, M.D.   Ct Biopsy  03/15/2012  *RADIOLOGY REPORT*  Indication: Evaluate for multiple myeloma  CT GUIDED LEFT ILIAC BONE MARROW ASPIRATION AND BONE MARROW CORE BIOPSY  Intravenous medications: Fentanyl 100 mcg IV; Versed 1 mg IV  Sedation time: 10 minutes  Contrast volume: None  Complications: None immediate  PROCEDURE/FINDINGS:  Informed consent was obtained from the patient following an explanation of the procedure, risks, benefits and alternatives. The patient understands, agrees and consents for the procedure. All questions were addressed.  A time out was performed prior to the initiation of the  procedure.  The patient was positioned prone and noncontrast localization CT was performed of the pelvis to demonstrate the iliac marrow spaces.  The operative site was prepped and draped in the usual sterile fashion.  Under sterile conditions and local anesthesia, an 11 gauge coaxial bone biopsy needle was advanced into the left iliac marrow space. Needle position was confirmed with CT imaging.  Initially, bone marrow aspiration was performed. Next, a bone marrow biopsy was obtained with the 11 gauge outer bone marrow device.  Samples were prepared with the cytotechnologist and deemed adequate.  The needle was removed intact.  Hemostasis was obtained with compression and a  dressing was placed. The patient tolerated the procedure well without immediate post procedural complication.  IMPRESSION:  Successful CT guided left iliac bone marrow aspiration and core biopsy.  Original Report Authenticated By: Waynard Reeds, M.D.   Ct Biopsy  03/13/2012  *RADIOLOGY REPORT*  Clinical Data/Indication: T12 VERTEBRAL BODY LESION  CT-GUIDED BIOPSY HAVE A T12 VERTEBRAL BODY LESION.  CORE.  Sedation: Versed 2.0 mg, Fentanyl 100 mcg.  Total Moderate Sedation Time: 30 minutes.  Procedure: The procedure, risks, benefits, and alternatives were explained to the patient. Questions regarding the procedure were encouraged and answered. The patient understands and consents to the procedure.  The low back in the prone position was prepped with betadine in a sterile fashion, and a sterile drape was applied covering the operative field. A sterile gown and sterile gloves were used for the procedure.  Under CT guidance, the 17 gauge needle was inserted into the T12 vertebral body lesion via left paramedian approach.  Four 18 gauge core biopsies were obtained. Final imaging was performed.  Patient tolerated the procedure well without complication.  Vital sign monitoring by nursing staff during the procedure will continue as patient is in the special procedures unit for post procedure observation.  Findings: The images document guide needle placement within the T12 vertebral body lesion.  Post biopsy images demonstrate no hemorrhage.  IMPRESSION: Successful CT-guided core biopsy of a T12 vertebral body lesion.  Original Report Authenticated By: Donavan Burnet, M.D.   Dg Bone Survey Met  03/15/2012  *RADIOLOGY REPORT*  Clinical Data: Multiple myeloma.  METASTATIC BONE SURVEY  Comparison: None.  Findings: There is one tiny lytic lesion in the frontal bone of the skull on the lateral view.  There are lytic lesions in T12, T10, T9 and T3 and T4.  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  Posterior elements and the vertebral body are partially destroyed at T12.  There are a few tiny areas of lucency in the pelvic bones and in the proximal femurs bilaterally. There are tiny lucent lesions in the right scapula and proximal right humerus.  The lesions in the sacrum seen on the lumbar MRI are not appreciable on these radiographs.  IMPRESSION: Multiple lesions of multiple myeloma including destruction of the T12 vertebral body.  Lytic lesions elsewhere in the thoracic spine as described and as reported on the thoracic MRI.  Original Report Authenticated By: Gwynn Burly, M.D.    Scheduled Meds:   . amLODipine  5 mg Oral Daily  . baclofen  5 mg Oral TID  . dexamethasone (DECADRON) IVPB  40 mg Intravenous Q24H  . docusate sodium  100 mg Oral BID  . enoxaparin  60 mg Subcutaneous Q24H  . feeding supplement  237 mL Oral BID BM  . insulin aspart  0-15 Units  Subcutaneous TID WC  . insulin aspart  0-5 Units Subcutaneous QHS  . LORazepam  1 mg Intravenous Once  . oxyCODONE  20 mg Oral Q12H  . pantoprazole  40 mg Oral Q1200  . polyethylene glycol  17 g Oral Daily  . DISCONTD: dexamethasone  40 mg Intravenous Q24H  . DISCONTD: dexamethasone  4 mg Oral Q6H   Continuous Infusions:   . sodium chloride 50 mL/hr at 03/21/12 1608  . DISCONTD: sodium chloride 50 mL/hr at 03/21/12 2154   PRN Meds:.acetaminophen, acetaminophen, alum & mag hydroxide-simeth, bisacodyl, HYDROmorphone (DILAUDID) injection, hyoscyamine, methocarbamol, ondansetron (ZOFRAN) IV, ondansetron, oxyCODONE, sodium chloride, zolpidem, DISCONTD:  HYDROmorphone (DILAUDID) injection, DISCONTD:  HYDROmorphone (DILAUDID) injection, DISCONTD:  HYDROmorphone (DILAUDID) injection, DISCONTD:  HYDROmorphone (DILAUDID) injection, DISCONTD: ondansetron (ZOFRAN) IV DISCONTD: ondansetron (ZOFRAN) IV

## 2012-03-22 NOTE — Evaluation (Signed)
Physical Therapy Re-Evaluation  Patient Details Name: Robert Berger MRN: [id number] DOB: 04-12-1941 Today's Date: 03/22/2012 Time: 4098-1191 PT Time Calculation (min): 15 min  PT Assessment / Plan / Recommendation Clinical Impression  This is a PT re-evaluation for pt who underwent kyphoplasty 8/13.  He is experiencing some pain relief, but does have increase in pain in upright He will benefit from PT for LE and core strengtheing for back protection prior to d/c home.  Recommend ST-SNF    PT Assessment  Patient needs continued PT services    Follow Up Recommendations  Skilled nursing facility    Barriers to Discharge        Equipment Recommendations  Rolling walker with 5" wheels    Recommendations for Other Services     Frequency Min 3X/week    Precautions / Restrictions Precautions Precautions: Back Restrictions Weight Bearing Restrictions: No   Pertinent Vitals/Pain Pt c/o pain in back from surgery that progressed to more of the pain he had before while he was upright.       Mobility  Bed Mobility Bed Mobility: Rolling Left;Left Sidelying to Sit;Sit to Sidelying Right Rolling Left: 5: Supervision Left Sidelying to Sit: 4: Min assist;HOB flat Sit to Sidelying Right: 4: Min guard Details for Bed Mobility Assistance: pt directs how to assist him Transfers Transfers: Sit to Stand;Stand to Sit Sit to Stand: From bed;5: Supervision;From chair/3-in-1 Stand to Sit: To bed;5: Supervision;To chair/3-in-1 Details for Transfer Assistance: pt is able to use back precautions to control movement Ambulation/Gait Ambulation/Gait Assistance: 4: Min assist Ambulation Distance (Feet): 6 Feet Assistive device: Rolling walker Ambulation/Gait Assistance Details: pt started to have increase in back pain as he walked that was like the pain he experienced before the kyphoplasty His MD said it would take a while for this pain to go away Gait Pattern: Step-to pattern Gait velocity:  decreased General Gait Details: pt protective of back Wheelchair Mobility Wheelchair Mobility: No    Exercises     PT Diagnosis: Difficulty walking;Abnormality of gait;Generalized weakness  PT Problem List: Decreased activity tolerance;Decreased mobility;Pain;Decreased knowledge of use of DME;Decreased knowledge of precautions PT Treatment Interventions: DME instruction;Gait training;Stair training;Functional mobility training;Therapeutic activities;Therapeutic exercise;Patient/family education   PT Goals Acute Rehab PT Goals PT Goal Formulation: With patient/family Time For Goal Achievement: 04/05/12 Potential to Achieve Goals: Good Pt will Roll Supine to Right Side: with modified independence PT Goal: Rolling Supine to Right Side - Progress: Goal set today Pt will go Supine/Side to Sit: with modified independence PT Goal: Supine/Side to Sit - Progress: Goal set today Pt will go Sit to Stand: with modified independence PT Goal: Sit to Stand - Progress: Goal set today Pt will Ambulate: >150 feet;with modified independence;with least restrictive assistive device PT Goal: Ambulate - Progress: Goal set today Pt will Go Up / Down Stairs: 1-2 stairs;with least restrictive assistive device;with modified independence PT Goal: Up/Down Stairs - Progress: Goal set today  Visit Information  Last PT Received On: 03/22/12 Assistance Needed: +1    Subjective Data  Subjective: I have to have radiation Patient Stated Goal: to get better    Prior Functioning  Home Living Lives With: Significant other (significant other) Available Help at Discharge: Family;Available PRN/intermittently Type of Home: House Home Access: Stairs to enter [employer] of Steps: 1-2 Home Layout: One level Bathroom Shower/Tub: Health visitor: Standard Home Adaptive Equipment: Straight cane Prior Function Level of Independence: Independent Able to Take Stairs?: Yes Driving:  Yes Communication Communication: No difficulties  Cognition  Overall Cognitive Status: Appears within functional limits for tasks assessed/performed Arousal/Alertness: Awake/alert Orientation Level: Appears intact for tasks assessed Behavior During Session: [facility] for tasks performed    Extremity/Trunk Assessment Right Upper Extremity Assessment RUE ROM/Strength/Tone: Within functional levels Left Upper Extremity Assessment LUE ROM/Strength/Tone: Within functional levels Right Lower Extremity Assessment RLE ROM/Strength/Tone: [facility] for tasks assessed Left Lower Extremity Assessment LLE ROM/Strength/Tone: WFL for tasks assessed   Balance    End of Session PT - End of Session Activity Tolerance: Patient limited by pain Patient left: in bed;with call bell/phone within reach;with family/visitor present Nurse Communication: Mobility status  GP     Donnetta Hail 03/22/2012, 4:12 PM

## 2012-03-22 NOTE — Clinical Social Work Psychosocial (Signed)
     Clinical Social Work Department BRIEF PSYCHOSOCIAL ASSESSMENT 03/22/2012  Patient:  Robert Berger,Robert Berger     Account Number:  [id number]     Admit date:  03/09/2012  Clinical Social Worker:  Robin Searing  Date/Time:  03/22/2012 12:45 PM  Referred by:  Physician  Date Referred:  03/22/2012 Referred for  SNF Placement   Other Referral:   Interview type:  Patient Other interview type:    PSYCHOSOCIAL DATA Living Status:  WIFE Admitted from facility:   Level of care:   Primary support name:  wife Primary support relationship to patient:  FAMILY Degree of support available:   good    CURRENT CONCERNS Current Concerns  Post-Acute Placement   Other Concerns:    SOCIAL WORK ASSESSMENT / PLAN Patient feels he will need SNF at d/c- he is s/p khyphoplasty. Patient has [employer] as primary and he understnads they will require SNF auth for transfer.  Await PT/OT evals s/p Kyhphoplasty   Assessment/plan status:  Other - See comment Other assessment/ plan:   Will complete FL2 and PASARR for SNF search.   Information/referral to community resources:   SNF  EMS  HH/DME    PATIENTS/FAMILYS RESPONSE TO PLAN OF CARE: Patient is receptive to this plan- understands the insurance auth and probable limited in network SNF's. He is optimistic  and eager to rehabilitate.

## 2012-03-22 NOTE — Progress Notes (Signed)
Pt had late lunch and therefore delayed insulin administration. CBG for dinner was 217, but at time taken pt had just finished late lunch. Will continue to monitor pt. Dinner correction will be reported to night shift to complete when pt eats dinner later.

## 2012-03-22 NOTE — Telephone Encounter (Signed)
Dr. Simonne Maffucci called stating patient had his kyphoplasty yesterday and will be able to start his radiation therapy today,  Tahnked Dr.  And will will let the appropriate Doctor and nurse know , patient is Dr.Squire's patient, notified CT Sim  Of patient in  room 1334  And his Ct SIm schedule for today at 1100 am is okay to start his radiation today,notified Anne Fu 10:20 AM

## 2012-03-23 ENCOUNTER — Ambulatory Visit
Admit: 2012-03-23 | Discharge: 2012-03-23 | Disposition: A | Discharge status: Home / Self Care | Payer: BC Managed Care – PPO | Attending: Radiation Oncology | Admitting: Radiation Oncology

## 2012-03-23 ENCOUNTER — Other Ambulatory Visit: Payer: Self-pay | Admitting: Hematology & Oncology

## 2012-03-23 DIAGNOSIS — I2510 Atherosclerotic heart disease of native coronary artery without angina pectoris: Secondary | ICD-10-CM

## 2012-03-23 DIAGNOSIS — C9000 Multiple myeloma not having achieved remission: Secondary | ICD-10-CM

## 2012-03-23 LAB — GLUCOSE, CAPILLARY
Glucose-Capillary: 135 mg/dL — ABNORMAL HIGH (ref 70–99)
Glucose-Capillary: 135 mg/dL — ABNORMAL HIGH (ref 70–99)
Glucose-Capillary: 130 mg/dL — ABNORMAL HIGH (ref 70–99)
Glucose-Capillary: 134 mg/dL — ABNORMAL HIGH (ref 70–99)

## 2012-03-23 LAB — CREATININE, SERUM: GFR calc Af Amer: >90 mL/min (ref >90)

## 2012-03-23 MED ORDER — DEXAMETHASONE 4 MG PO TABS
6.0000 mg | ORAL_TABLET | Freq: Two times a day (BID) | ORAL | Status: DC
  Administered 2012-03-24: 6 mg via ORAL
  Filled 2012-03-23 (×3): qty 1.5

## 2012-03-23 MED ORDER — FAMCICLOVIR 500 MG PO TABS
500.0000 mg | ORAL_TABLET | Freq: Every day | ORAL | Status: DC
  Administered 2012-03-23 – 2012-03-24 (×2): 500 mg via ORAL
  Filled 2012-03-23 (×2): qty 1

## 2012-03-23 MED ORDER — DEXAMETHASONE 6 MG PO TABS
6.0000 mg | ORAL_TABLET | Freq: Two times a day (BID) | ORAL | Status: DC
  Filled 2012-03-23 (×2): qty 1

## 2012-03-23 MED ORDER — ONDANSETRON HCL 8 MG PO TABS
8.0000 mg | ORAL_TABLET | Freq: Once | ORAL | Status: AC
  Administered 2012-03-23: 8 mg via ORAL
  Filled 2012-03-23: qty 1

## 2012-03-23 MED ORDER — BORTEZOMIB CHEMO SQ INJECTION 3.5 MG (2.5MG/ML)
1.3000 mg/m2 | Freq: Once | INTRAMUSCULAR | Status: AC
  Administered 2012-03-23: 3 mg via SUBCUTANEOUS
  Filled 2012-03-23: qty 1.2

## 2012-03-23 NOTE — Progress Notes (Signed)
  Radiation Oncology         [phone]) [phone] ________________________________  Name: Robert Berger MRN: [id number]  Date: 03/23/2012  DOB: 1940/12/21  Simulation Verification Note  Status: inpatient  NARRATIVE: The patient was brought to the treatment unit and placed in the planned treatment position. The clinical setup was verified. Then port films were obtained and uploaded to the radiation oncology medical record software.  The treatment beams were carefully compared against the planned radiation fields. The position location and shape of the radiation fields was reviewed. They targeted volume of tissue appears to be appropriately covered by the radiation beams. Organs at risk appear to be excluded as planned.  Based on my personal review, I approved the simulation verification. The patient's treatment will proceed as planned.  -----------------------------------  Billie Lade, PhD, MD

## 2012-03-23 NOTE — Progress Notes (Signed)
Pt provided with chemo education about Velcade. "Chemotherapy and You" book provided with Velcade handout.  Reviewed common side effects and precautions. Pt asked appropriate questions. Consent to administer chemo obtained.

## 2012-03-23 NOTE — Progress Notes (Signed)
[facility]                                                                                Patient Demographics  Robert Berger, is a 71 y.o. male  ZOX:[id number]  [id number]  DOB - Nov 05, 1940  Admit date - 03/09/2012  Admitting Physician Zannie Cove, MD  Outpatient Primary MD for the patient is [name], MD  LOS - 14   Chief Complaint  Patient presents with  . Abdominal Pain  . Constipation        Assessment & Plan   Brief narrative:  71 year old man who presented to the hospital on 03/09/12 with a 1 week history of abdominal pain, back pain and constipation. CT scan findings showed expansile lytic lesions involving thoracic and lumbar spine The patient underwent CT guided biopsy of T12 lesion with pathology findings consistent with plasma cell disorder. Diagnostic bone marrow biopsy subsequently done with findings of 11% plasma cells. Bone marrow aspirate for cytogenics and myeloma FISH panel are still pending.   Post ablation/vertebroplasty procedure of T9-T12 03/21/2012 on 03/21/2012 by IR. PT to evaluate today   Back pain secondary to IgG kappa multiple myeloma  Per oncology patient needs local therapy for pain control before chemotherapy he is status post kyphoplasty on 03/21/2012 have informed radiation oncology to start as needed, discussed with Dr. Myna Hidalgo patient's oncologist on 03/22/2012 Patient was already given Zometa 03/13/2012 but the plan is to perhaps give it again to prevent skeletal related events but prior to that he would need dental evaluation outpatient as zometa can cause osteonecrosis of the jaw and this patient already has a poor dentition  Decadron dose has been changed to oral as per Dr. Myna Hidalgo who I discussed the case with on 03/23/2012. Continue PT OT will need placement.      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    Hypertension  Stable on 5 mg of daily Norvasc    Hyperglycemia  Steroid induced  Started on sliding scale insulin TIDAC and HS   CBG (last 3)   Basename 03/23/12 1225 03/23/12 0749 03/22/12 2051  GLUCAP 134* 130* 245*      Hiatal hernia and fluid filled esophagus / GERD  Continue Protonix daily      Abdominal pain  Secondary to radiating pain from back related to multiple myeloma  Patient has now had bowel movement on daily basis; continue current bowel regimen Pain-free today      Code Status: Full  Family Communication: Discussed with patient  Disposition Plan: Likely home    Procedures kyphoplasty on 03/21/2012    Consults  oncology, radiation oncology, IR for kyphoplasty   Antibiotics  none   Time Spent in minutes  35   DVT Prophylaxis  Lovenox    Leroy Sea M.D on 03/23/2012 at 12:55 PM  Between 7am to 7pm - Pager - [phone]  After 7pm go to [email or web address] - password [email or web address]  And look for the night coverage person covering for me after hours  [facility] Office  [phone]    Subjective:   [name] today has, No headache, No chest pain, No abdominal pain - No Nausea, No new weakness tingling or numbness, No Cough - SOB. Back pain is much improved.  Objective:   Filed Vitals:   03/22/12 1340 03/22/12 2153 03/23/12 0557 03/23/12 0953  BP: 152/86 144/91 130/80 129/78  Pulse: 76 79 67   Temp: 97.7 F (36.5 C) 98.4 F (36.9 C) 98.2 F (36.8 C)   TempSrc: Oral Oral Oral   Resp: 18 18 20    Height:      Weight:      SpO2: 97% 95% 96%     Wt Readings from Last 3 Encounters:  03/09/12 113.399 kg (250 lb)  03/09/12 113.399 kg (250 lb)  03/09/12 113.399 kg (250 lb)     Intake/Output Summary (Last 24 hours) at 03/23/12 1255 Last data filed at 03/23/12 0557  Gross per 24 hour  Intake   3247 ml  Output   2725 ml  Net    522 ml     Exam Awake Alert, Oriented X 3, No new F.N deficits, Normal affect [place].AT,PERRAL Supple Neck,No JVD, No cervical lymphadenopathy appriciated.  Symmetrical Chest wall movement, Good air movement bilaterally, CTAB RRR,No Gallops,Rubs or new Murmurs, No Parasternal Heave +ve B.Sounds, Abd Soft, Non tender, No organomegaly appriciated, No rebound - guarding or rigidity. No Cyanosis, Clubbing or edema, No new Rash or bruise     Data Review   CBC  Lab 03/21/12 0405 03/20/12 1500 03/17/12 0347  WBC 9.5 10.5 9.2  HGB 13.6 13.8 13.6  HCT 39.4 39.4 38.6*  PLT 193 215 160  MCV 91.4 91.4 91.9  MCH 31.6 32.0 32.4  MCHC 34.5 35.0 35.2  RDW 13.8 13.9 14.3  LYMPHSABS -- -- --  MONOABS -- -- --  EOSABS -- -- --  BASOSABS -- -- --  BANDABS -- -- --    Chemistries   Lab 03/23/12 0451 03/21/12 0405 03/20/12 1500 03/17/12 0347  NA -- 132* 132* 134*  K -- 4.6 4.3 4.4  CL -- 103 102 103  CO2 -- 23 22 23   GLUCOSE -- 160* 199* 149*  BUN -- 19 20 16   CREATININE 0.70 0.67 0.72 0.75  CALCIUM -- 7.7* 8.0* 8.2*  MG -- -- -- --  AST -- -- -- --  ALT -- -- -- --  ALKPHOS -- -- -- --  BILITOT -- -- -- --   ------------------------------------------------------------------------------------------------------------------ estimated creatinine clearance is 103.5 ml/min (by C-G formula based on Cr of 0.7). ------------------------------------------------------------------------------------------------------------------ No results found for this basename: HGBA1C:2 in the last 72 hours ------------------------------------------------------------------------------------------------------------------ No results found for this basename: CHOL:2,HDL:2,LDLCALC:2,TRIG:2,CHOLHDL:2,LDLDIRECT:2 in the last 72 hours ------------------------------------------------------------------------------------------------------------------ No results found for this basename: TSH,T4TOTAL,FREET3,T3FREE,THYROIDAB in the  last 72 hours ------------------------------------------------------------------------------------------------------------------ No results found for this basename: VITAMINB12:2,FOLATE:2,FERRITIN:2,TIBC:2,IRON:2,RETICCTPCT:2 in the last 72 hours  Coagulation profile No results found for this basename: INR:5,PROTIME:5 in the last 168 hours  No results found for this basename: DDIMER:2 in the last 72 hours  Cardiac Enzymes No results found for this basename: CK:3,CKMB:3,TROPONINI:3,MYOGLOBIN:3 in the last 168 hours ------------------------------------------------------------------------------------------------------------------ No components found with this basename: POCBNP:3  Micro Results No results found for this or any previous visit (from the past 240 hour(s)).  Radiology Reports X-ray Chest Pa And Lateral   03/09/2012  *RADIOLOGY REPORT*  Clinical Data: No chest complaints.  Osseous lytic lesions on CT of the abdomen.  CHEST - 2 VIEW  Comparison: Abdominal pelvic CT of earlier  today.  Findings: Mild osteopenia.  Maintenance of thoracic vertebral body height.  Suspect ankylosing spondylitis or diffuse idiopathic skeletal hyperostosis involving the thoracic spine.  Low-volume AP frontal view.  Patient minimally rotated to the right. Normal heart size.  Normal mediastinal contours. No pleural effusion or pneumothorax.  Mildly low lung volumes. Clear lungs.  No other focal osseous lytic lesions are identified.  IMPRESSION: Low lung volumes without acute disease.  Original Report Authenticated By: Consuello Bossier, M.D.   Ct Chest W Contrast  03/10/2012  *RADIOLOGY REPORT*  Clinical Data: Lung cancer.  Lytic bone lesion.  Evaluate for primary.  CT CHEST WITH CONTRAST  Technique:  Multidetector CT imaging of the chest was performed following the standard protocol during bolus administration of intravenous contrast.  Contrast: 80mL OMNIPAQUE IOHEXOL 300 MG/ML  SOLN  Comparison: CT abdomen and bone  scan.  Findings: Enlargement of the left thyroid lobe is present, incompletely visualized.  This probably represents thyroid goiter. Coarse calcifications are present in the enlarged left thyroid lobe.  There is no axillary adenopathy.  No mediastinal or hilar adenopathy. Coronary artery atherosclerosis is present. If office based assessment of coronary risk factors has not been performed, it is now recommended.  Heart grossly appears within normal limits otherwise.  No pericardial effusion.  No pleural effusion.  Dependent atelectasis in the left lung.  Medial right upper lobe atelectasis or scarring with collapse along the right vertebral column.  Incidental imaging of the abdomen appears similar to the recent prior.  Fluid distends the esophagus.  Small hiatal hernia. Small calcified granuloma is present in the lateral right lower lobe (image 36 series 5).  There is a lytic lesion in the right sternal manubrium (image 11 series 5) measuring 15 mm.  Soft tissue density lesions also occupy the T9 and T10 vertebral bodies, best seen on sagittal imaging. There may be some right-sided extraosseous extension at T10.  No definite soft tissue mass in the central canal.  No destructive rib lesion is identified on the right to account for focus of increased radiotracer uptake and this may be secondary to degenerative disease.  Thoracic spine DISH.  IMPRESSION: 1.  Negative for lung cancer. 2.  Soft tissue density osteolytic lesions occupying T9 and T10 vertebral bodies, with possible small amount of extraosseous extension to the right of T10.  Although nonspecific, lymphoma, myeloma and metastatic disease of the primary considerations. Small metastatic lesion in the right sternal manubrium also present. 3.  Small hiatal hernia and fluid distended esophagus.  There may be some esophageal thickening distally.  Consider follow-up endoscopy.  Original Report Authenticated By: Andreas Newport, M.D.   Mr Cervical Spine Wo  Contrast  03/14/2012  *RADIOLOGY REPORT*  Clinical Data:  Lytic lesions noted on CT.  Possible myeloma.  Back pain.  MRI CERVICAL AND THORACIC SPINE WITHOUT CONTRAST  Technique:  Multiplanar and multiecho pulse sequences of the cervical spine, to include the craniocervical junction and cervicothoracic junction, and the thoracic spine, were obtained without intravenous contrast.  Comparison:  03/09/2012 CT of the abdomen and pelvis.  08/02 03/28/2012 CT of the chest.  03/10/2012 bone scan.  The present examination was ordered as a cervical, thoracic and lumbar spine MR with contrast however, the patient was not able to tolerate imaging and only a motion degraded MR of the cervical and thoracic spine without contrast was not able to be obtained.  MRI CERVICAL SPINE  Findings:  Motion degraded exam.  Subtle altered signal intensity of  bone marrow throughout the cervical spine raises possibility of involvement by tumor although less pronounced than seen in the thoracic region.  No obvious epidural extension of tumor in the region of the cervical spine.  Small areas of altered signal intensity suggest tumor involvement of portions of the clivus and occipital bone.  C5-6:  Broad-based disc osteophyte complex greater to the right with mild spinal stenosis and mild cord contact.  Uncinate bony overgrowth with moderate bilateral foraminal narrowing greater on the right.  C6-7:  Bulge/osteophyte greater to the right.  Mild spinal stenosis.  Minimal cord contact.  Mild bilateral foraminal narrowing.  IMPRESSION: Motion degraded exam.  Subtle altered signal intensity of bone marrow throughout the cervical spine raises possibility of involvement by tumor although less pronounced than seen in the thoracic region.  No obvious epidural extension of tumor in the region of the cervical spine.  Small areas of altered signal intensity suggest tumor involvement of portions of the clivus and occipital bone.  Degenerative changes C5-6 and  C6-7 as noted above.  MRI THORACIC SPINE  Small areas of altered signal intensity involving portions of T 3 through T8 vertebral body and left T6 facet suggestive of involvement by tumor.  Tumor involvement of a majority of the T9 and T10 vertebral body with extension into the T8-9 disc space and superior aspect of the T11 vertebral body. T9 and T10 tumor involvement extends into the posterior elements and there is mild bony expansion of the posterior aspect of the diseased vertebra greater to left midline without significant spinal stenosis or cord compression. Paraspinal extension tumor extends in the anterior lateral position bilaterally.  Destructive lesion T12 vertebra with 15% loss of height. Epidural extension of tumor with spinal stenosis and crowding of the distal cord / conus.  Extension of tumor into the neural foramen greater at the T12-L1 level with small amount of tumor extension into the T11-12 neural foramen.  Abnormal appearance of the ribs bilaterally.  This may represent result of anemia.  IMPRESSION: Destructive lesion T12 vertebra with epidural and foraminal extension of tumor as detailed above.  This causes spinal stenosis and crowding of the distal cord / conus.  Tumor involvement from the lower T8 to upper T11 with mild bony expansion of the  posterior aspect of the diseased T9 and T10 vertebral body but without cord compression.  Small areas of tumor involvement T3-T8.  Please see above for further detail.  This has been made a PRA call report utilizing dashboard call feature.  Original Report Authenticated By: Fuller Canada, M.D.   Mr Cervical Spine W Contrast  03/15/2012  *RADIOLOGY REPORT*  Clinical Data:  Back pain.  Possible myeloma.  MRI CERVICAL AND THORACIC SPINE WITH CONTRAST  Technique:  Multiplanar and multiecho pulse sequences of the cervical spine, to include the craniocervical junction and cervicothoracic junction, and the thoracic spine, were obtained with contrast.   Contrast: 20mL MULTIHANCE GADOBENATE DIMEGLUMINE 529 MG/ML IV SOLN  Comparison:  Precontrast MR of the cervical and thoracic spine performed 03/14/2012.  MRI CERVICAL SPINE  Findings:  Motion degraded exam.  Abnormal areas of enhancement consistent with involvement by tumor involving portions of the clivus, occipital condyles and all of the cervical vertebra.  Most prominent involvement is that involving the C2, C3 and C5 vertebral body as well as the right C5 facet and left C7 facet.  No obvious epidural extension of tumor noted on this motion degraded examination nor is there evidence of foraminal extension.  Regions of altered signal intensity involving portions of the cervical cord at the C3 and C5-6 level probably related to motion artifact rather than  involvement by tumor.  Enlarged left thyroid gland.  Tumor not excluded.  Ultrasound can be obtained for further delineation.  IMPRESSION: Motion degraded exam.  Abnormal areas of enhancement consistent with involvement by tumor involving portions of the clivus, occipital condyles and all of the cervical vertebra.  Most prominent involvement is that involving the C2, C3 and C5 vertebral body as well as the right C5 facet and left C7 facet.  No obvious epidural extension of tumor noted on this motion degraded examination nor is there evidence of foraminal extension.  Enlarged left thyroid gland.  Tumor not excluded.  Ultrasound can be obtained for further delineation.  MRI THORACIC SPINE  Findings: Only a significantly motion degraded sagittal post contrast examination of the thoracic spine was able to be obtained secondary to patient discomfort. Tumor involvement of T12 vertebra is better delineated on the lumbar spine MR.  Diffuse tumor involvement extends from the inferior T8 plate through the upper T11 end plate involving a majority of the T9 and T10 vertebral body extending into the posterior elements with mild expansion of the diseased vertebra contributing  to the mild spinal stenosis.  Epidural extension of tumor/engorged veins greater on the right extending from T9 to below T12 (most notable epidural tumor at the T12 level).  Diffuse tumor involvement of the T12 vertebral body involving posterior elements with loss of height centrally inferior aspect. Epidural extension of tumor with crowding of the distal cord / conus.  Foraminal extension of epidural disease more notable T12-L1 then at the T11-T12 level with encroachment upon the exiting T12 nerve roots.  Small areas of tumor involvement T3 through T8 vertebra including T3 and T4 spinous process, left T6 facet and left T8 facet.  IMPRESSION: Only a significantly motion degraded sagittal post contrast examination of the thoracic spine was able to be obtained.  Diffuse tumor involvement extends from the inferior T8 plate through the upper T11 end plate involving a majority of the T9 and T10 vertebral body extending into the posterior elements with mild expansion of the diseased vertebra contributing to the mild spinal stenosis.  Epidural extension of tumor/engorged veins greater on the right extending from T9 to below T12 (most notable epidural tumor at the T12 level).  Diffuse tumor involvement of the T12 vertebral body involving posterior elements with loss of height centrally inferior aspect. Epidural extension of tumor with crowding of the distal cord / conus.  Foraminal extension of epidural disease more notable T12-L1 then at the T11-T12 level with encroachment upon the exiting T12 nerve roots.  Small areas of tumor involvement T3 through T8 vertebra including T3 and T4 spinous process, left T6 facet and left T8 facet.  MRI LUMBAR SPINE WITHOUT AND WITH CONTRAST  Technique:  Multiplanar and multiecho pulse sequences of the lumbar spine were obtained without and with intravenous contrast.  Contrast: 20mL MULTIHANCE GADOBENATE DIMEGLUMINE 529 MG/ML IV SOLN  Comparison:  CT abdomen and pelvis 03/09/2012.   Findings:  Tumor involvement of all of the visualized vertebra from L1 through the lowest aspect of the sacrum imaged (S3). Tumor involvement of the ilium bilaterally. Most notable tumor involvement L3 vertebral body and right aspect of the sacral ala.  No epidural extension of tumor in the lumbar-sacral spine.  Conus L1 level.  L1-2:  Right posterior lateral/paracentral protrusion.  Slight crowding of the exiting  right L1 nerve root and mild impression upon the right ventral aspect of the thecal sac.  L2-3:  Broad-based disc osteophyte complex greater right lateral position with slight impression upon the exiting right L2 nerve root.  Facet joint degenerative changes.  L3-4:  Baseline broad-based disc osteophyte complex.  Lateral extension touches but does not compress the exiting L3 nerve roots. Small central disc protrusion with minimal indentation upon the thecal sac.  Mild facet joint degenerative changes.  L4-5:  Small to moderate sized broad-based disc protrusion greater to the left.  Left greater than right lateral recess stenosis with mild spinal stenosis.  L5-S1:  Minimal bulge.  IMPRESSION: Tumor involvement of all of the visualized vertebra from L1 through the lowest aspect of the sacrum imaged (S3). Tumor involvement of the ilium bilaterally. Most notable tumor involvement L3 vertebral body and right aspect of the sacral ala.  No epidural extension of tumor in the lumbar-sacral spine.  Lumbar spine degenerative changes as detailed above.  Original Report Authenticated By: Fuller Canada, M.D.   Mr Thoracic Spine Wo Contrast  03/14/2012  *RADIOLOGY REPORT*  Clinical Data:  Lytic lesions noted on CT.  Possible myeloma.  Back pain.  MRI CERVICAL AND THORACIC SPINE WITHOUT CONTRAST  Technique:  Multiplanar and multiecho pulse sequences of the cervical spine, to include the craniocervical junction and cervicothoracic junction, and the thoracic spine, were obtained without intravenous contrast.   Comparison:  03/09/2012 CT of the abdomen and pelvis.  08/02 03/28/2012 CT of the chest.  03/10/2012 bone scan.  The present examination was ordered as a cervical, thoracic and lumbar spine MR with contrast however, the patient was not able to tolerate imaging and only a motion degraded MR of the cervical and thoracic spine without contrast was not able to be obtained.  MRI CERVICAL SPINE  Findings:  Motion degraded exam.  Subtle altered signal intensity of bone marrow throughout the cervical spine raises possibility of involvement by tumor although less pronounced than seen in the thoracic region.  No obvious epidural extension of tumor in the region of the cervical spine.  Small areas of altered signal intensity suggest tumor involvement of portions of the clivus and occipital bone.  C5-6:  Broad-based disc osteophyte complex greater to the right with mild spinal stenosis and mild cord contact.  Uncinate bony overgrowth with moderate bilateral foraminal narrowing greater on the right.  C6-7:  Bulge/osteophyte greater to the right.  Mild spinal stenosis.  Minimal cord contact.  Mild bilateral foraminal narrowing.  IMPRESSION: Motion degraded exam.  Subtle altered signal intensity of bone marrow throughout the cervical spine raises possibility of involvement by tumor although less pronounced than seen in the thoracic region.  No obvious epidural extension of tumor in the region of the cervical spine.  Small areas of altered signal intensity suggest tumor involvement of portions of the clivus and occipital bone.  Degenerative changes C5-6 and C6-7 as noted above.  MRI THORACIC SPINE  Small areas of altered signal intensity involving portions of T 3 through T8 vertebral body and left T6 facet suggestive of involvement by tumor.  Tumor involvement of a majority of the T9 and T10 vertebral body with extension into the T8-9 disc space and superior aspect of the T11 vertebral body. T9 and T10 tumor involvement extends  into the posterior elements and there is mild bony expansion of the posterior aspect of the diseased vertebra greater to left midline without significant spinal stenosis or cord compression. Paraspinal extension  tumor extends in the anterior lateral position bilaterally.  Destructive lesion T12 vertebra with 15% loss of height. Epidural extension of tumor with spinal stenosis and crowding of the distal cord / conus.  Extension of tumor into the neural foramen greater at the T12-L1 level with small amount of tumor extension into the T11-12 neural foramen.  Abnormal appearance of the ribs bilaterally.  This may represent result of anemia.  IMPRESSION: Destructive lesion T12 vertebra with epidural and foraminal extension of tumor as detailed above.  This causes spinal stenosis and crowding of the distal cord / conus.  Tumor involvement from the lower T8 to upper T11 with mild bony expansion of the  posterior aspect of the diseased T9 and T10 vertebral body but without cord compression.  Small areas of tumor involvement T3-T8.  Please see above for further detail.  This has been made a PRA call report utilizing dashboard call feature.  Original Report Authenticated By: Fuller Canada, M.D.   Mr Thoracic Spine W Contrast  03/15/2012  *RADIOLOGY REPORT*  Clinical Data:  Back pain.  Possible myeloma.  MRI CERVICAL AND THORACIC SPINE WITH CONTRAST  Technique:  Multiplanar and multiecho pulse sequences of the cervical spine, to include the craniocervical junction and cervicothoracic junction, and the thoracic spine, were obtained with contrast.  Contrast: 20mL MULTIHANCE GADOBENATE DIMEGLUMINE 529 MG/ML IV SOLN  Comparison:  Precontrast MR of the cervical and thoracic spine performed 03/14/2012.  MRI CERVICAL SPINE  Findings:  Motion degraded exam.  Abnormal areas of enhancement consistent with involvement by tumor involving portions of the clivus, occipital condyles and all of the cervical vertebra.  Most prominent  involvement is that involving the C2, C3 and C5 vertebral body as well as the right C5 facet and left C7 facet.  No obvious epidural extension of tumor noted on this motion degraded examination nor is there evidence of foraminal extension.  Regions of altered signal intensity involving portions of the cervical cord at the C3 and C5-6 level probably related to motion artifact rather than  involvement by tumor.  Enlarged left thyroid gland.  Tumor not excluded.  Ultrasound can be obtained for further delineation.  IMPRESSION: Motion degraded exam.  Abnormal areas of enhancement consistent with involvement by tumor involving portions of the clivus, occipital condyles and all of the cervical vertebra.  Most prominent involvement is that involving the C2, C3 and C5 vertebral body as well as the right C5 facet and left C7 facet.  No obvious epidural extension of tumor noted on this motion degraded examination nor is there evidence of foraminal extension.  Enlarged left thyroid gland.  Tumor not excluded.  Ultrasound can be obtained for further delineation.  MRI THORACIC SPINE  Findings: Only a significantly motion degraded sagittal post contrast examination of the thoracic spine was able to be obtained secondary to patient discomfort. Tumor involvement of T12 vertebra is better delineated on the lumbar spine MR.  Diffuse tumor involvement extends from the inferior T8 plate through the upper T11 end plate involving a majority of the T9 and T10 vertebral body extending into the posterior elements with mild expansion of the diseased vertebra contributing to the mild spinal stenosis.  Epidural extension of tumor/engorged veins greater on the right extending from T9 to below T12 (most notable epidural tumor at the T12 level).  Diffuse tumor involvement of the T12 vertebral body involving posterior elements with loss of height centrally inferior aspect. Epidural extension of tumor with crowding of the distal cord /  conus.   Foraminal extension of epidural disease more notable T12-L1 then at the T11-T12 level with encroachment upon the exiting T12 nerve roots.  Small areas of tumor involvement T3 through T8 vertebra including T3 and T4 spinous process, left T6 facet and left T8 facet.  IMPRESSION: Only a significantly motion degraded sagittal post contrast examination of the thoracic spine was able to be obtained.  Diffuse tumor involvement extends from the inferior T8 plate through the upper T11 end plate involving a majority of the T9 and T10 vertebral body extending into the posterior elements with mild expansion of the diseased vertebra contributing to the mild spinal stenosis.  Epidural extension of tumor/engorged veins greater on the right extending from T9 to below T12 (most notable epidural tumor at the T12 level).  Diffuse tumor involvement of the T12 vertebral body involving posterior elements with loss of height centrally inferior aspect. Epidural extension of tumor with crowding of the distal cord / conus.  Foraminal extension of epidural disease more notable T12-L1 then at the T11-T12 level with encroachment upon the exiting T12 nerve roots.  Small areas of tumor involvement T3 through T8 vertebra including T3 and T4 spinous process, left T6 facet and left T8 facet.  MRI LUMBAR SPINE WITHOUT AND WITH CONTRAST  Technique:  Multiplanar and multiecho pulse sequences of the lumbar spine were obtained without and with intravenous contrast.  Contrast: 20mL MULTIHANCE GADOBENATE DIMEGLUMINE 529 MG/ML IV SOLN  Comparison:  CT abdomen and pelvis 03/09/2012.  Findings:  Tumor involvement of all of the visualized vertebra from L1 through the lowest aspect of the sacrum imaged (S3). Tumor involvement of the ilium bilaterally. Most notable tumor involvement L3 vertebral body and right aspect of the sacral ala.  No epidural extension of tumor in the lumbar-sacral spine.  Conus L1 level.  L1-2:  Right posterior lateral/paracentral  protrusion.  Slight crowding of the exiting right L1 nerve root and mild impression upon the right ventral aspect of the thecal sac.  L2-3:  Broad-based disc osteophyte complex greater right lateral position with slight impression upon the exiting right L2 nerve root.  Facet joint degenerative changes.  L3-4:  Baseline broad-based disc osteophyte complex.  Lateral extension touches but does not compress the exiting L3 nerve roots. Small central disc protrusion with minimal indentation upon the thecal sac.  Mild facet joint degenerative changes.  L4-5:  Small to moderate sized broad-based disc protrusion greater to the left.  Left greater than right lateral recess stenosis with mild spinal stenosis.  L5-S1:  Minimal bulge.  IMPRESSION: Tumor involvement of all of the visualized vertebra from L1 through the lowest aspect of the sacrum imaged (S3). Tumor involvement of the ilium bilaterally. Most notable tumor involvement L3 vertebral body and right aspect of the sacral ala.  No epidural extension of tumor in the lumbar-sacral spine.  Lumbar spine degenerative changes as detailed above.  Original Report Authenticated By: Fuller Canada, M.D.   Mr Lumbar Spine W Wo Contrast  03/15/2012  *RADIOLOGY REPORT*  Clinical Data:  Back pain.  Possible myeloma.  MRI CERVICAL AND THORACIC SPINE WITH CONTRAST  Technique:  Multiplanar and multiecho pulse sequences of the cervical spine, to include the craniocervical junction and cervicothoracic junction, and the thoracic spine, were obtained with contrast.  Contrast: 20mL MULTIHANCE GADOBENATE DIMEGLUMINE 529 MG/ML IV SOLN  Comparison:  Precontrast MR of the cervical and thoracic spine performed 03/14/2012.  MRI CERVICAL SPINE  Findings:  Motion degraded exam.  Abnormal areas of  enhancement consistent with involvement by tumor involving portions of the clivus, occipital condyles and all of the cervical vertebra.  Most prominent involvement is that involving the C2, C3 and C5  vertebral body as well as the right C5 facet and left C7 facet.  No obvious epidural extension of tumor noted on this motion degraded examination nor is there evidence of foraminal extension.  Regions of altered signal intensity involving portions of the cervical cord at the C3 and C5-6 level probably related to motion artifact rather than  involvement by tumor.  Enlarged left thyroid gland.  Tumor not excluded.  Ultrasound can be obtained for further delineation.  IMPRESSION: Motion degraded exam.  Abnormal areas of enhancement consistent with involvement by tumor involving portions of the clivus, occipital condyles and all of the cervical vertebra.  Most prominent involvement is that involving the C2, C3 and C5 vertebral body as well as the right C5 facet and left C7 facet.  No obvious epidural extension of tumor noted on this motion degraded examination nor is there evidence of foraminal extension.  Enlarged left thyroid gland.  Tumor not excluded.  Ultrasound can be obtained for further delineation.  MRI THORACIC SPINE  Findings: Only a significantly motion degraded sagittal post contrast examination of the thoracic spine was able to be obtained secondary to patient discomfort. Tumor involvement of T12 vertebra is better delineated on the lumbar spine MR.  Diffuse tumor involvement extends from the inferior T8 plate through the upper T11 end plate involving a majority of the T9 and T10 vertebral body extending into the posterior elements with mild expansion of the diseased vertebra contributing to the mild spinal stenosis.  Epidural extension of tumor/engorged veins greater on the right extending from T9 to below T12 (most notable epidural tumor at the T12 level).  Diffuse tumor involvement of the T12 vertebral body involving posterior elements with loss of height centrally inferior aspect. Epidural extension of tumor with crowding of the distal cord / conus.  Foraminal extension of epidural disease more notable  T12-L1 then at the T11-T12 level with encroachment upon the exiting T12 nerve roots.  Small areas of tumor involvement T3 through T8 vertebra including T3 and T4 spinous process, left T6 facet and left T8 facet.  IMPRESSION: Only a significantly motion degraded sagittal post contrast examination of the thoracic spine was able to be obtained.  Diffuse tumor involvement extends from the inferior T8 plate through the upper T11 end plate involving a majority of the T9 and T10 vertebral body extending into the posterior elements with mild expansion of the diseased vertebra contributing to the mild spinal stenosis.  Epidural extension of tumor/engorged veins greater on the right extending from T9 to below T12 (most notable epidural tumor at the T12 level).  Diffuse tumor involvement of the T12 vertebral body involving posterior elements with loss of height centrally inferior aspect. Epidural extension of tumor with crowding of the distal cord / conus.  Foraminal extension of epidural disease more notable T12-L1 then at the T11-T12 level with encroachment upon the exiting T12 nerve roots.  Small areas of tumor involvement T3 through T8 vertebra including T3 and T4 spinous process, left T6 facet and left T8 facet.  MRI LUMBAR SPINE WITHOUT AND WITH CONTRAST  Technique:  Multiplanar and multiecho pulse sequences of the lumbar spine were obtained without and with intravenous contrast.  Contrast: 20mL MULTIHANCE GADOBENATE DIMEGLUMINE 529 MG/ML IV SOLN  Comparison:  CT abdomen and pelvis 03/09/2012.  Findings:  Tumor  involvement of all of the visualized vertebra from L1 through the lowest aspect of the sacrum imaged (S3). Tumor involvement of the ilium bilaterally. Most notable tumor involvement L3 vertebral body and right aspect of the sacral ala.  No epidural extension of tumor in the lumbar-sacral spine.  Conus L1 level.  L1-2:  Right posterior lateral/paracentral protrusion.  Slight crowding of the exiting right L1 nerve  root and mild impression upon the right ventral aspect of the thecal sac.  L2-3:  Broad-based disc osteophyte complex greater right lateral position with slight impression upon the exiting right L2 nerve root.  Facet joint degenerative changes.  L3-4:  Baseline broad-based disc osteophyte complex.  Lateral extension touches but does not compress the exiting L3 nerve roots. Small central disc protrusion with minimal indentation upon the thecal sac.  Mild facet joint degenerative changes.  L4-5:  Small to moderate sized broad-based disc protrusion greater to the left.  Left greater than right lateral recess stenosis with mild spinal stenosis.  L5-S1:  Minimal bulge.  IMPRESSION: Tumor involvement of all of the visualized vertebra from L1 through the lowest aspect of the sacrum imaged (S3). Tumor involvement of the ilium bilaterally. Most notable tumor involvement L3 vertebral body and right aspect of the sacral ala.  No epidural extension of tumor in the lumbar-sacral spine.  Lumbar spine degenerative changes as detailed above.  Original Report Authenticated By: Fuller Canada, M.D.   Nm Bone Scan Whole Body  03/10/2012  *RADIOLOGY REPORT*  Clinical Data: Lumbar spine pain.  Evaluate lytic lesions.  NUCLEAR MEDICINE WHOLE BODY BONE SCINTIGRAPHY  Technique:  Whole body anterior and posterior images were obtained approximately 3 hours after intravenous injection of radiopharmaceutical.  Radiopharmaceutical: CURIE TC-MDP TECHNETIUM TC 69M MEDRONATE IV KIT  Comparison: CT 03/09/2012.  Findings: Increased uptake is present in the T12 vertebra, greater on the right than left.  This corresponds with the lytic lesions seen on recent CT.  This is best seen on the posterior views with increased uptake projected over the pedicles and extension into the posterior elements.  Degenerative changes are present at the knees, shoulders, and ankles.  There is a small right posterior focus of increased uptake in the right  ribs, apparently in the right posterior ninth rib. This could be degenerative or represent a tiny focus of metastatic disease not visualized on prior CT.  Retrospective review of the CT was performed and no definite lytic lesion was present.  IMPRESSION: Increased radiotracer uptake in the T12 vertebra corresponds with the lytic lesion on recent CT, most compatible with neoplasm, likely metastatic disease or myeloma.  Original Report Authenticated By: Andreas Newport, M.D.   US Soft Tissue Head/neck  03/11/2012  *RADIOLOGY REPORT*  Clinical Data: Asymmetric enlargement on recent CT  THYROID ULTRASOUND  Technique: Ultrasound examination of the thyroid gland and adjacent soft tissues was performed.  Comparison:  CT 03/10/2012  Findings:  Right thyroid lobe:  24 x 26 x 56 mm, inhomogeneous Left thyroid lobe:  24 x 35 x 59 mm Isthmus:  5 mm in thickness  Focal nodules:  7 x 8 x 10 mm complex mostly solid, mid-right 7 x 10 x 15 mm complex mostly solid, inferior right 8 x 12 x 14 mm solid, superior left 16 x 19 x 23 mm complex mostly solid, mid-left 12 x 14 x 14 mm hypoechoic solid, inferior left There are least three additional smaller bilateral and isthmic nodules measuring less than 1 cm.  Lymphadenopathy:  None visualized.  IMPRESSION:  Multiple bilateral thyroid nodules, dominant lesion on the left. Findings meet consensus criteria for biopsy.  Ultrasound-guided fine needle aspiration should be considered, as per the consensus statement: Management of Thyroid Nodules Detected at [place]:  Society of Radiologists in Ultrasound Consensus Conference Statement. Radiology 2005; [id number].  Original Report Authenticated By: Osa Craver, M.D.   Ct Abdomen Pelvis W Contrast  03/09/2012  *RADIOLOGY REPORT*  Clinical Data: LLQ pain, constipation  CT ABDOMEN AND PELVIS WITH CONTRAST  Technique:  Multidetector CT imaging of the abdomen and pelvis was performed following the standard protocol during bolus administration  of intravenous contrast.  Contrast: OMNIPAQUE IOHEXOL 300 MG/ML  SOLN  Comparison: None.  Findings: Lung bases are essentially clear.  Small hiatal hernia.  Liver, pancreas, and adrenal glands are within normal limits.  Gallbladder is mildly distended but without associated inflammatory changes by CT.  No intrahepatic or extrahepatic ductal dilatation.  Kidneys are within normal limits.  No hydronephrosis.  No evidence of bowel obstruction.  Normal appendix.  Colonic diverticulosis, without associated inflammatory changes.  No suspicious abdominopelvic lymphadenopathy.  Prostatomegaly, measuring 6.1 cm in transverse dimension.  Bladder is unremarkable.  Expansile lytic lesion within the left posterior T12 vertebral body (series 2/image 26) with suspected cortical disruption posteriorly (sagittal image 69).  Additional suspected lytic lesions anteriorly in the L1 vertebral body (sagittal image 66) and posteriorly in the L3 vertebral body (sagittal image 63).  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  IMPRESSION: No evidence of bowel obstruction.  Normal appendix.  Colonic diverticulosis, without associated inflammatory changes.  No CT findings to account for the patient's abdominal pain.  Expansile lytic lesion within the left T12 vertebral body with suspected cortical disruption posteriorly.  Additional suspected lytic lesions and L1 and L3.  Primary differential considerations are metastases and myeloma.  Original Report Authenticated By: Charline Bills, M.D.   US Thyroid Biopsy  03/13/2012  *RADIOLOGY REPORT*  Clinical Data/Indication: Dominant left thyroid nodule  ULTRASOUND-GUIDED BIOPSY OF A DOMINANT LEFT THYROID NODULE.  FINE NEEDLE ASPIRATION.  Procedure: The procedure, risks, benefits, and alternatives were explained to the patient. Questions regarding the procedure were encouraged and answered. The patient understands and consents  to the procedure.  The neck was prepped with betadine in a sterile fashion, and a sterile drape was applied covering the operative field. A mask and sterile gloves were used for the procedure.  Under sonographic guidance, three 25 gauge fine needle aspirates of the dominant left thyroid nodule were obtained. Final imaging was performed.  Findings: The images document guide needle placement within the abdominal left thyroid nodule. Post biopsy images demonstrate no hemorrhage.  IMPRESSION: Successful ultrasound-guided fine needle aspiration of a dominant left thyroid nodule.  Original Report Authenticated By: Donavan Burnet, M.D.   Ct Biopsy  03/15/2012  *RADIOLOGY REPORT*  Indication: Evaluate for multiple myeloma  CT GUIDED LEFT ILIAC BONE MARROW ASPIRATION AND BONE MARROW CORE BIOPSY  Intravenous medications: Fentanyl 100 mcg IV; Versed 1 mg IV  Sedation time: 10 minutes  Contrast volume: None  Complications: None immediate  PROCEDURE/FINDINGS:  Informed consent was obtained from the patient following an explanation of the procedure, risks, benefits and alternatives. The patient understands, agrees and consents for the procedure. All questions were addressed.  A time out was performed prior to the initiation of the procedure.  The patient was positioned prone and noncontrast localization CT was performed of the pelvis to  demonstrate the iliac marrow spaces.  The operative site was prepped and draped in the usual sterile fashion.  Under sterile conditions and local anesthesia, an 11 gauge coaxial bone biopsy needle was advanced into the left iliac marrow space. Needle position was confirmed with CT imaging.  Initially, bone marrow aspiration was performed. Next, a bone marrow biopsy was obtained with the 11 gauge outer bone marrow device.  Samples were prepared with the cytotechnologist and deemed adequate.  The needle was removed intact.  Hemostasis was obtained with compression and a dressing was placed. The  patient tolerated the procedure well without immediate post procedural complication.  IMPRESSION:  Successful CT guided left iliac bone marrow aspiration and core biopsy.  Original Report Authenticated By: Waynard Reeds, M.D.   Ct Biopsy  03/13/2012  *RADIOLOGY REPORT*  Clinical Data/Indication: T12 VERTEBRAL BODY LESION  CT-GUIDED BIOPSY HAVE A T12 VERTEBRAL BODY LESION.  CORE.  Sedation: Versed 2.0 mg, Fentanyl 100 mcg.  Total Moderate Sedation Time: 30 minutes.  Procedure: The procedure, risks, benefits, and alternatives were explained to the patient. Questions regarding the procedure were encouraged and answered. The patient understands and consents to the procedure.  The low back in the prone position was prepped with betadine in a sterile fashion, and a sterile drape was applied covering the operative field. A sterile gown and sterile gloves were used for the procedure.  Under CT guidance, the 17 gauge needle was inserted into the T12 vertebral body lesion via left paramedian approach.  Four 18 gauge core biopsies were obtained. Final imaging was performed.  Patient tolerated the procedure well without complication.  Vital sign monitoring by nursing staff during the procedure will continue as patient is in the special procedures unit for post procedure observation.  Findings: The images document guide needle placement within the T12 vertebral body lesion.  Post biopsy images demonstrate no hemorrhage.  IMPRESSION: Successful CT-guided core biopsy of a T12 vertebral body lesion.  Original Report Authenticated By: Donavan Burnet, M.D.   Dg Bone Survey Met  03/15/2012  *RADIOLOGY REPORT*  Clinical Data: Multiple myeloma.  METASTATIC BONE SURVEY  Comparison: None.  Findings: There is one tiny lytic lesion in the frontal bone of the skull on the lateral view.  There are lytic lesions in T12, T10, T9 and T3 and T4.  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  Posterior  elements and the vertebral body are partially destroyed at T12.  There are a few tiny areas of lucency in the pelvic bones and in the proximal femurs bilaterally. There are tiny lucent lesions in the right scapula and proximal right humerus.  The lesions in the sacrum seen on the lumbar MRI are not appreciable on these radiographs.  IMPRESSION: Multiple lesions of multiple myeloma including destruction of the T12 vertebral body.  Lytic lesions elsewhere in the thoracic spine as described and as reported on the thoracic MRI.  Original Report Authenticated By: Gwynn Burly, M.D.    Scheduled Meds:    . amLODipine  5 mg Oral Daily  . baclofen  5 mg Oral TID  . bortezomib SQ  1.3 mg/m2 (Treatment Plan Actual) Subcutaneous Once  . dexamethasone  6 mg Oral Q12H  . docusate sodium  100 mg Oral BID  . enoxaparin  60 mg Subcutaneous Q24H  . famciclovir  500 mg Oral Daily  . feeding supplement  237 mL Oral BID BM  . insulin aspart  0-15 Units Subcutaneous TID WC  . insulin aspart  0-5 Units Subcutaneous QHS  . LORazepam  1 mg Intravenous Once  . ondansetron  8 mg Oral Once  . oxyCODONE  20 mg Oral Q12H  . pantoprazole  40 mg Oral Q1200  . polyethylene glycol  17 g Oral Daily  . DISCONTD: dexamethasone (DECADRON) IVPB  40 mg Intravenous Q24H   Continuous Infusions:  PRN Meds:.acetaminophen, acetaminophen, alum & mag hydroxide-simeth, bisacodyl, hyoscyamine, methocarbamol, ondansetron (ZOFRAN) IV, ondansetron, oxyCODONE, sodium chloride, zolpidem

## 2012-03-23 NOTE — Progress Notes (Signed)
[name] is feeling better with less back pain. He did get a beta stable physical therapy. He walked a little bit better he still has a long way to go. I suspect he probably will need rehabilitation as an outpatient.  He has a kyphoplasty has done a nice job for him. I did do suspect that he will improve as he continues to become a more mobile.  He starts radiation therapy today. I totally appreciate Dr. Karoline Caldwell in her prompt attention to his problem.  I will go ahead and give him a dose of Velcade today. I think that some systemic therapy will help. He was on high-dose Decadron.  He's been need to be on shingles prophylaxis. I prefer Famvir.  The next step is him to outpatient therapy. Once again as an outpatient, we can then look at starting Revlimid.   His lab work has looked decent.  I talked with him about chemotherapy. He is ready to start. I don't see any issues with respect to the Velcade. This should have minimal toxicity.  [name] has a great attitude. He is very proactive and we'll continue to work hard to get better. His ultimate goal, I think, is to try to get back to work.  I'll continue pray for him.  Pete E.

## 2012-03-23 NOTE — Progress Notes (Signed)
2 Days Post-Op  Subjective: Pt currently off the floor and in radiation oncology dept; nurse reports pt has decreased back pain and needing less pain meds; no new neurological problems  Objective: Vital signs in last 24 hours: Temp:  [98.2 F (36.8 C)-98.4 F (36.9 C)] 98.2 F (36.8 C) (08/15 0557) Pulse Rate:  [67-79] 67  (08/15 0557) Resp:  [18-20] 20  (08/15 0557) BP: (129-144)/(78-91) 129/78 mmHg (08/15 0953) SpO2:  [95 %-96 %] 96 % (08/15 0557) Last BM Date: 03/20/12  Intake/Output from previous day: 08/14 0701 - 08/15 0700 In: 3541 [P.O.:2394; IV Piggyback:54] Out: 2725 [Urine:2725] Intake/Output this shift: Total I/O In: -  Out: 350 [Urine:350]    Lab Results:   Basename 03/21/12 0405 03/20/12 1500  WBC 9.5 10.5  HGB 13.6 13.8  HCT 39.4 39.4  PLT 193 215   BMET  Basename 03/23/12 0451 03/21/12 0405 03/20/12 1500  NA -- 132* 132*  K -- 4.6 4.3  CL -- 103 102  CO2 -- 23 22  GLUCOSE -- 160* 199*  BUN -- 19 20  CREATININE 0.70 0.67 --  CALCIUM -- 7.7* 8.0*   PT/INR No results found for this basename: LABPROT:2,INR:2 in the last 72 hours ABG No results found for this basename: PHART:2,PCO2:2,PO2:2,HCO3:2 in the last 72 hours  Studies/Results: Ir Kyphoplasty Or Sacroplasty  03/23/2012  *RADIOLOGY REPORT*  Clinical Data:  Patient with severe low back pain secondary to pathologic compression fractures at T9, T10, T11 and T12.  History of multiple myeloma.  BONE TUMOR ABLATION FOLLOWED BY VERTEBRAL BODY AUGMENTATION  Comparison:  MRI scan of the thoracolumbar spine of 03/15/2012.  Following a full explanation of the procedure along with the potential associated complications, an informed witnessed consent was obtained.  The patient was put under general anesthesia by the Department of Anesthesiology at [facility].  The patient was then placed prone on the fluoroscopic table.  The skin overlying the thoracolumbar region was then prepped and draped in the  usual sterile fashion.  The right pedicle at T11, the left pedicle at T12, the left pedicle at T10 and the right pedicle at T9 were then infiltrated with 0.25% bupivacaine.  Using biplane intermittent fluoroscopy, 10.5-gauge DFine spine needle were advanced into the posterior third at each of the levels.  Using separate DFine core bone biopsy needles, biopsies were obtained using 20 ml syringes at each level, and sent for pathologic analysis.  Through each working needle, a DFine ablation radiofrequency device was advanced and manipulated using biplane intermittent fluoroscopy to create ablation of the involved vertebral bodies.  At T9, total ablation time was 120 seconds achieving a temperature of 51 degrees Celsius.  At T10 in a neutral and inferior position, ablation time was approximately 25 seconds achieving a temperature of 50 Celsius.  At T11 ablations were performed in the neutral position for 3.5 minutes, and the inferior location for 1 minute, and the superior location for 1 minute and contralaterally for 15 seconds.  At each level temperatures reached were between 46 degrees Celsius and 50 degrees Celsius at the distal end of the ablation device.  At T12 ablation was performed in the neutral and inferior aspects of the vertebral body achieving temperatures of 50 degrees Celsius for approximately 1 minute.  The ablation device was removed.  The working spine needles were then advanced to the junction of the anterior and middle thirds of each of the vertebral bodies.  Methylmethacrylate mixture was then reconstituted with Tobramycin in  the DFine mixing system. This was then loaded onto the DFine injector device. The injector devices were then locked into position at the hubs of the needles.  Pulsed delivery of methylmethacrylate mixture was then performed at each of the levels.  Excellent filling was obtained in the AP and lateral projections.  There was no extrusion noted into disc spaces or posteriorly  into the spinal canal at each of the levels.  No paraspinous venous contamination was seen.  The needles were then removed.  Hemostasis was achieved at the skin entry sites.  At T12 incomplete crossing of the midline was noted.  This prompted the use of a 13-gauge Cook spine needle through the right pedicle with placement of methylmethacrylate mixture using a Stryker delivery device system.  Again no extravasation was noted into the adjacent paraspinous soft tissues or posteriorly into the spinal canal.  This needle was then removed.  Hemostasis was achieved at the skin entry site.  No acute cardiovascular or neurological changes were seen during the procedure.  The patient's general anesthetic was then reversed.  The patient was then extubated without difficulty.  Upon recovery the patient's neurological status remained at his pre-procedural baseline.  The patient was then transferred to the Neuro PACU for recovery.  IMPRESSION 1. Status post fluoroscopic-guided needle placement for bone biopsy at T9, T10, T11 and T12. 2.  Status post vertebral body radiofrequency ablation followed by augmentation for painful pathologic compression fractures at T9, T10, T11, and T12, using the DFine kyphoplasty technique.  Original Report Authenticated By: Oneal Grout, M.D.   Ir Kyphoplasty Or Sacroplasty  03/23/2012  *RADIOLOGY REPORT*  Clinical Data:  Patient with severe low back pain secondary to pathologic compression fractures at T9, T10, T11 and T12.  History of multiple myeloma.  BONE TUMOR ABLATION FOLLOWED BY VERTEBRAL BODY AUGMENTATION  Comparison:  MRI scan of the thoracolumbar spine of 03/15/2012.  Following a full explanation of the procedure along with the potential associated complications, an informed witnessed consent was obtained.  The patient was put under general anesthesia by the Department of Anesthesiology at [facility].  The patient was then placed prone on the fluoroscopic table.  The  skin overlying the thoracolumbar region was then prepped and draped in the usual sterile fashion.  The right pedicle at T11, the left pedicle at T12, the left pedicle at T10 and the right pedicle at T9 were then infiltrated with 0.25% bupivacaine.  Using biplane intermittent fluoroscopy, 10.5-gauge DFine spine needle were advanced into the posterior third at each of the levels.  Using separate DFine core bone biopsy needles, biopsies were obtained using 20 ml syringes at each level, and sent for pathologic analysis.  Through each working needle, a DFine ablation radiofrequency device was advanced and manipulated using biplane intermittent fluoroscopy to create ablation of the involved vertebral bodies.  At T9, total ablation time was 120 seconds achieving a temperature of 51 degrees Celsius.  At T10 in a neutral and inferior position, ablation time was approximately 25 seconds achieving a temperature of 50 Celsius.  At T11 ablations were performed in the neutral position for 3.5 minutes, and the inferior location for 1 minute, and the superior location for 1 minute and contralaterally for 15 seconds.  At each level temperatures reached were between 46 degrees Celsius and 50 degrees Celsius at the distal end of the ablation device.  At T12 ablation was performed in the neutral and inferior aspects of the vertebral body achieving temperatures of  50 degrees Celsius for approximately 1 minute.  The ablation device was removed.  The working spine needles were then advanced to the junction of the anterior and middle thirds of each of the vertebral bodies.  Methylmethacrylate mixture was then reconstituted with Tobramycin in the DFine mixing system. This was then loaded onto the DFine injector device. The injector devices were then locked into position at the hubs of the needles.  Pulsed delivery of methylmethacrylate mixture was then performed at each of the levels.  Excellent filling was obtained in the AP and lateral  projections.  There was no extrusion noted into disc spaces or posteriorly into the spinal canal at each of the levels.  No paraspinous venous contamination was seen.  The needles were then removed.  Hemostasis was achieved at the skin entry sites.  At T12 incomplete crossing of the midline was noted.  This prompted the use of a 13-gauge Cook spine needle through the right pedicle with placement of methylmethacrylate mixture using a Stryker delivery device system.  Again no extravasation was noted into the adjacent paraspinous soft tissues or posteriorly into the spinal canal.  This needle was then removed.  Hemostasis was achieved at the skin entry site.  No acute cardiovascular or neurological changes were seen during the procedure.  The patient's general anesthetic was then reversed.  The patient was then extubated without difficulty.  Upon recovery the patient's neurological status remained at his pre-procedural baseline.  The patient was then transferred to the Neuro PACU for recovery.  IMPRESSION 1. Status post fluoroscopic-guided needle placement for bone biopsy at T9, T10, T11 and T12. 2.  Status post vertebral body radiofrequency ablation followed by augmentation for painful pathologic compression fractures at T9, T10, T11, and T12, using the DFine kyphoplasty technique.  Original Report Authenticated By: Oneal Grout, M.D.   Ir Kyphoplasty Or Sacroplasty  03/23/2012  *RADIOLOGY REPORT*  Clinical Data:  Patient with severe low back pain secondary to pathologic compression fractures at T9, T10, T11 and T12.  History of multiple myeloma.  BONE TUMOR ABLATION FOLLOWED BY VERTEBRAL BODY AUGMENTATION  Comparison:  MRI scan of the thoracolumbar spine of 03/15/2012.  Following a full explanation of the procedure along with the potential associated complications, an informed witnessed consent was obtained.  The patient was put under general anesthesia by the Department of Anesthesiology at [facility].  The patient was then placed prone on the fluoroscopic table.  The skin overlying the thoracolumbar region was then prepped and draped in the usual sterile fashion.  The right pedicle at T11, the left pedicle at T12, the left pedicle at T10 and the right pedicle at T9 were then infiltrated with 0.25% bupivacaine.  Using biplane intermittent fluoroscopy, 10.5-gauge DFine spine needle were advanced into the posterior third at each of the levels.  Using separate DFine core bone biopsy needles, biopsies were obtained using 20 ml syringes at each level, and sent for pathologic analysis.  Through each working needle, a DFine ablation radiofrequency device was advanced and manipulated using biplane intermittent fluoroscopy to create ablation of the involved vertebral bodies.  At T9, total ablation time was 120 seconds achieving a temperature of 51 degrees Celsius.  At T10 in a neutral and inferior position, ablation time was approximately 25 seconds achieving a temperature of 50 Celsius.  At T11 ablations were performed in the neutral position for 3.5 minutes, and the inferior location for 1 minute, and the superior location for 1 minute and contralaterally  for 15 seconds.  At each level temperatures reached were between 46 degrees Celsius and 50 degrees Celsius at the distal end of the ablation device.  At T12 ablation was performed in the neutral and inferior aspects of the vertebral body achieving temperatures of 50 degrees Celsius for approximately 1 minute.  The ablation device was removed.  The working spine needles were then advanced to the junction of the anterior and middle thirds of each of the vertebral bodies.  Methylmethacrylate mixture was then reconstituted with Tobramycin in the DFine mixing system. This was then loaded onto the DFine injector device. The injector devices were then locked into position at the hubs of the needles.  Pulsed delivery of methylmethacrylate mixture was then performed at  each of the levels.  Excellent filling was obtained in the AP and lateral projections.  There was no extrusion noted into disc spaces or posteriorly into the spinal canal at each of the levels.  No paraspinous venous contamination was seen.  The needles were then removed.  Hemostasis was achieved at the skin entry sites.  At T12 incomplete crossing of the midline was noted.  This prompted the use of a 13-gauge Cook spine needle through the right pedicle with placement of methylmethacrylate mixture using a Stryker delivery device system.  Again no extravasation was noted into the adjacent paraspinous soft tissues or posteriorly into the spinal canal.  This needle was then removed.  Hemostasis was achieved at the skin entry site.  No acute cardiovascular or neurological changes were seen during the procedure.  The patient's general anesthetic was then reversed.  The patient was then extubated without difficulty.  Upon recovery the patient's neurological status remained at his pre-procedural baseline.  The patient was then transferred to the Neuro PACU for recovery.  IMPRESSION 1. Status post fluoroscopic-guided needle placement for bone biopsy at T9, T10, T11 and T12. 2.  Status post vertebral body radiofrequency ablation followed by augmentation for painful pathologic compression fractures at T9, T10, T11, and T12, using the DFine kyphoplasty technique.  Original Report Authenticated By: Oneal Grout, M.D.   Ir Kyphoplasty Or Sacroplasty  03/23/2012  *RADIOLOGY REPORT*  Clinical Data:  Patient with severe low back pain secondary to pathologic compression fractures at T9, T10, T11 and T12.  History of multiple myeloma.  BONE TUMOR ABLATION FOLLOWED BY VERTEBRAL BODY AUGMENTATION  Comparison:  MRI scan of the thoracolumbar spine of 03/15/2012.  Following a full explanation of the procedure along with the potential associated complications, an informed witnessed consent was obtained.  The patient was put  under general anesthesia by the Department of Anesthesiology at [facility].  The patient was then placed prone on the fluoroscopic table.  The skin overlying the thoracolumbar region was then prepped and draped in the usual sterile fashion.  The right pedicle at T11, the left pedicle at T12, the left pedicle at T10 and the right pedicle at T9 were then infiltrated with 0.25% bupivacaine.  Using biplane intermittent fluoroscopy, 10.5-gauge DFine spine needle were advanced into the posterior third at each of the levels.  Using separate DFine core bone biopsy needles, biopsies were obtained using 20 ml syringes at each level, and sent for pathologic analysis.  Through each working needle, a DFine ablation radiofrequency device was advanced and manipulated using biplane intermittent fluoroscopy to create ablation of the involved vertebral bodies.  At T9, total ablation time was 120 seconds achieving a temperature of 51 degrees Celsius.  At T10 in a  neutral and inferior position, ablation time was approximately 25 seconds achieving a temperature of 50 Celsius.  At T11 ablations were performed in the neutral position for 3.5 minutes, and the inferior location for 1 minute, and the superior location for 1 minute and contralaterally for 15 seconds.  At each level temperatures reached were between 46 degrees Celsius and 50 degrees Celsius at the distal end of the ablation device.  At T12 ablation was performed in the neutral and inferior aspects of the vertebral body achieving temperatures of 50 degrees Celsius for approximately 1 minute.  The ablation device was removed.  The working spine needles were then advanced to the junction of the anterior and middle thirds of each of the vertebral bodies.  Methylmethacrylate mixture was then reconstituted with Tobramycin in the DFine mixing system. This was then loaded onto the DFine injector device. The injector devices were then locked into position at the hubs of the  needles.  Pulsed delivery of methylmethacrylate mixture was then performed at each of the levels.  Excellent filling was obtained in the AP and lateral projections.  There was no extrusion noted into disc spaces or posteriorly into the spinal canal at each of the levels.  No paraspinous venous contamination was seen.  The needles were then removed.  Hemostasis was achieved at the skin entry sites.  At T12 incomplete crossing of the midline was noted.  This prompted the use of a 13-gauge Cook spine needle through the right pedicle with placement of methylmethacrylate mixture using a Stryker delivery device system.  Again no extravasation was noted into the adjacent paraspinous soft tissues or posteriorly into the spinal canal.  This needle was then removed.  Hemostasis was achieved at the skin entry site.  No acute cardiovascular or neurological changes were seen during the procedure.  The patient's general anesthetic was then reversed.  The patient was then extubated without difficulty.  Upon recovery the patient's neurological status remained at his pre-procedural baseline.  The patient was then transferred to the Neuro PACU for recovery.  IMPRESSION 1. Status post fluoroscopic-guided needle placement for bone biopsy at T9, T10, T11 and T12. 2.  Status post vertebral body radiofrequency ablation followed by augmentation for painful pathologic compression fractures at T9, T10, T11, and T12, using the DFine kyphoplasty technique.  Original Report Authenticated By: Oneal Grout, M.D.   Ir Bone Tumor Rf Ablation  03/23/2012  *RADIOLOGY REPORT*  Clinical Data:  Patient with severe low back pain secondary to pathologic compression fractures at T9, T10, T11 and T12.  History of multiple myeloma.  BONE TUMOR ABLATION FOLLOWED BY VERTEBRAL BODY AUGMENTATION  Comparison:  MRI scan of the thoracolumbar spine of 03/15/2012.  Following a full explanation of the procedure along with the potential associated  complications, an informed witnessed consent was obtained.  The patient was put under general anesthesia by the Department of Anesthesiology at [facility].  The patient was then placed prone on the fluoroscopic table.  The skin overlying the thoracolumbar region was then prepped and draped in the usual sterile fashion.  The right pedicle at T11, the left pedicle at T12, the left pedicle at T10 and the right pedicle at T9 were then infiltrated with 0.25% bupivacaine.  Using biplane intermittent fluoroscopy, 10.5-gauge DFine spine needle were advanced into the posterior third at each of the levels.  Using separate DFine core bone biopsy needles, biopsies were obtained using 20 ml syringes at each level, and sent for pathologic analysis.  Through each working needle, a DFine ablation radiofrequency device was advanced and manipulated using biplane intermittent fluoroscopy to create ablation of the involved vertebral bodies.  At T9, total ablation time was 120 seconds achieving a temperature of 51 degrees Celsius.  At T10 in a neutral and inferior position, ablation time was approximately 25 seconds achieving a temperature of 50 Celsius.  At T11 ablations were performed in the neutral position for 3.5 minutes, and the inferior location for 1 minute, and the superior location for 1 minute and contralaterally for 15 seconds.  At each level temperatures reached were between 46 degrees Celsius and 50 degrees Celsius at the distal end of the ablation device.  At T12 ablation was performed in the neutral and inferior aspects of the vertebral body achieving temperatures of 50 degrees Celsius for approximately 1 minute.  The ablation device was removed.  The working spine needles were then advanced to the junction of the anterior and middle thirds of each of the vertebral bodies.  Methylmethacrylate mixture was then reconstituted with Tobramycin in the DFine mixing system. This was then loaded onto the DFine injector  device. The injector devices were then locked into position at the hubs of the needles.  Pulsed delivery of methylmethacrylate mixture was then performed at each of the levels.  Excellent filling was obtained in the AP and lateral projections.  There was no extrusion noted into disc spaces or posteriorly into the spinal canal at each of the levels.  No paraspinous venous contamination was seen.  The needles were then removed.  Hemostasis was achieved at the skin entry sites.  At T12 incomplete crossing of the midline was noted.  This prompted the use of a 13-gauge Cook spine needle through the right pedicle with placement of methylmethacrylate mixture using a Stryker delivery device system.  Again no extravasation was noted into the adjacent paraspinous soft tissues or posteriorly into the spinal canal.  This needle was then removed.  Hemostasis was achieved at the skin entry site.  No acute cardiovascular or neurological changes were seen during the procedure.  The patient's general anesthetic was then reversed.  The patient was then extubated without difficulty.  Upon recovery the patient's neurological status remained at his pre-procedural baseline.  The patient was then transferred to the Neuro PACU for recovery.  IMPRESSION 1. Status post fluoroscopic-guided needle placement for bone biopsy at T9, T10, T11 and T12. 2.  Status post vertebral body radiofrequency ablation followed by augmentation for painful pathologic compression fractures at T9, T10, T11, and T12, using the DFine kyphoplasty technique.  Original Report Authenticated By: Oneal Grout, M.D.   Ir Bone Tumor Rf Ablation  03/23/2012  *RADIOLOGY REPORT*  Clinical Data:  Patient with severe low back pain secondary to pathologic compression fractures at T9, T10, T11 and T12.  History of multiple myeloma.  BONE TUMOR ABLATION FOLLOWED BY VERTEBRAL BODY AUGMENTATION  Comparison:  MRI scan of the thoracolumbar spine of 03/15/2012.  Following a  full explanation of the procedure along with the potential associated complications, an informed witnessed consent was obtained.  The patient was put under general anesthesia by the Department of Anesthesiology at [facility].  The patient was then placed prone on the fluoroscopic table.  The skin overlying the thoracolumbar region was then prepped and draped in the usual sterile fashion.  The right pedicle at T11, the left pedicle at T12, the left pedicle at T10 and the right pedicle at T9 were then infiltrated with  0.25% bupivacaine.  Using biplane intermittent fluoroscopy, 10.5-gauge DFine spine needle were advanced into the posterior third at each of the levels.  Using separate DFine core bone biopsy needles, biopsies were obtained using 20 ml syringes at each level, and sent for pathologic analysis.  Through each working needle, a DFine ablation radiofrequency device was advanced and manipulated using biplane intermittent fluoroscopy to create ablation of the involved vertebral bodies.  At T9, total ablation time was 120 seconds achieving a temperature of 51 degrees Celsius.  At T10 in a neutral and inferior position, ablation time was approximately 25 seconds achieving a temperature of 50 Celsius.  At T11 ablations were performed in the neutral position for 3.5 minutes, and the inferior location for 1 minute, and the superior location for 1 minute and contralaterally for 15 seconds.  At each level temperatures reached were between 46 degrees Celsius and 50 degrees Celsius at the distal end of the ablation device.  At T12 ablation was performed in the neutral and inferior aspects of the vertebral body achieving temperatures of 50 degrees Celsius for approximately 1 minute.  The ablation device was removed.  The working spine needles were then advanced to the junction of the anterior and middle thirds of each of the vertebral bodies.  Methylmethacrylate mixture was then reconstituted with Tobramycin in  the DFine mixing system. This was then loaded onto the DFine injector device. The injector devices were then locked into position at the hubs of the needles.  Pulsed delivery of methylmethacrylate mixture was then performed at each of the levels.  Excellent filling was obtained in the AP and lateral projections.  There was no extrusion noted into disc spaces or posteriorly into the spinal canal at each of the levels.  No paraspinous venous contamination was seen.  The needles were then removed.  Hemostasis was achieved at the skin entry sites.  At T12 incomplete crossing of the midline was noted.  This prompted the use of a 13-gauge Cook spine needle through the right pedicle with placement of methylmethacrylate mixture using a Stryker delivery device system.  Again no extravasation was noted into the adjacent paraspinous soft tissues or posteriorly into the spinal canal.  This needle was then removed.  Hemostasis was achieved at the skin entry site.  No acute cardiovascular or neurological changes were seen during the procedure.  The patient's general anesthetic was then reversed.  The patient was then extubated without difficulty.  Upon recovery the patient's neurological status remained at his pre-procedural baseline.  The patient was then transferred to the Neuro PACU for recovery.  IMPRESSION 1. Status post fluoroscopic-guided needle placement for bone biopsy at T9, T10, T11 and T12. 2.  Status post vertebral body radiofrequency ablation followed by augmentation for painful pathologic compression fractures at T9, T10, T11, and T12, using the DFine kyphoplasty technique.  Original Report Authenticated By: Oneal Grout, M.D.   Ir Bone Tumor Rf Ablation  03/23/2012  *RADIOLOGY REPORT*  Clinical Data:  Patient with severe low back pain secondary to pathologic compression fractures at T9, T10, T11 and T12.  History of multiple myeloma.  BONE TUMOR ABLATION FOLLOWED BY VERTEBRAL BODY AUGMENTATION   Comparison:  MRI scan of the thoracolumbar spine of 03/15/2012.  Following a full explanation of the procedure along with the potential associated complications, an informed witnessed consent was obtained.  The patient was put under general anesthesia by the Department of Anesthesiology at [facility].  The patient was then placed prone on  the fluoroscopic table.  The skin overlying the thoracolumbar region was then prepped and draped in the usual sterile fashion.  The right pedicle at T11, the left pedicle at T12, the left pedicle at T10 and the right pedicle at T9 were then infiltrated with 0.25% bupivacaine.  Using biplane intermittent fluoroscopy, 10.5-gauge DFine spine needle were advanced into the posterior third at each of the levels.  Using separate DFine core bone biopsy needles, biopsies were obtained using 20 ml syringes at each level, and sent for pathologic analysis.  Through each working needle, a DFine ablation radiofrequency device was advanced and manipulated using biplane intermittent fluoroscopy to create ablation of the involved vertebral bodies.  At T9, total ablation time was 120 seconds achieving a temperature of 51 degrees Celsius.  At T10 in a neutral and inferior position, ablation time was approximately 25 seconds achieving a temperature of 50 Celsius.  At T11 ablations were performed in the neutral position for 3.5 minutes, and the inferior location for 1 minute, and the superior location for 1 minute and contralaterally for 15 seconds.  At each level temperatures reached were between 46 degrees Celsius and 50 degrees Celsius at the distal end of the ablation device.  At T12 ablation was performed in the neutral and inferior aspects of the vertebral body achieving temperatures of 50 degrees Celsius for approximately 1 minute.  The ablation device was removed.  The working spine needles were then advanced to the junction of the anterior and middle thirds of each of the vertebral  bodies.  Methylmethacrylate mixture was then reconstituted with Tobramycin in the DFine mixing system. This was then loaded onto the DFine injector device. The injector devices were then locked into position at the hubs of the needles.  Pulsed delivery of methylmethacrylate mixture was then performed at each of the levels.  Excellent filling was obtained in the AP and lateral projections.  There was no extrusion noted into disc spaces or posteriorly into the spinal canal at each of the levels.  No paraspinous venous contamination was seen.  The needles were then removed.  Hemostasis was achieved at the skin entry sites.  At T12 incomplete crossing of the midline was noted.  This prompted the use of a 13-gauge Cook spine needle through the right pedicle with placement of methylmethacrylate mixture using a Stryker delivery device system.  Again no extravasation was noted into the adjacent paraspinous soft tissues or posteriorly into the spinal canal.  This needle was then removed.  Hemostasis was achieved at the skin entry site.  No acute cardiovascular or neurological changes were seen during the procedure.  The patient's general anesthetic was then reversed.  The patient was then extubated without difficulty.  Upon recovery the patient's neurological status remained at his pre-procedural baseline.  The patient was then transferred to the Neuro PACU for recovery.  IMPRESSION 1. Status post fluoroscopic-guided needle placement for bone biopsy at T9, T10, T11 and T12. 2.  Status post vertebral body radiofrequency ablation followed by augmentation for painful pathologic compression fractures at T9, T10, T11, and T12, using the DFine kyphoplasty technique.  Original Report Authenticated By: Oneal Grout, M.D.   Ir Bone Tumor Rf Ablation  03/23/2012  *RADIOLOGY REPORT*  Clinical Data:  Patient with severe low back pain secondary to pathologic compression fractures at T9, T10, T11 and T12.  History of multiple  myeloma.  BONE TUMOR ABLATION FOLLOWED BY VERTEBRAL BODY AUGMENTATION  Comparison:  MRI scan of the thoracolumbar spine of  03/15/2012.  Following a full explanation of the procedure along with the potential associated complications, an informed witnessed consent was obtained.  The patient was put under general anesthesia by the Department of Anesthesiology at [facility].  The patient was then placed prone on the fluoroscopic table.  The skin overlying the thoracolumbar region was then prepped and draped in the usual sterile fashion.  The right pedicle at T11, the left pedicle at T12, the left pedicle at T10 and the right pedicle at T9 were then infiltrated with 0.25% bupivacaine.  Using biplane intermittent fluoroscopy, 10.5-gauge DFine spine needle were advanced into the posterior third at each of the levels.  Using separate DFine core bone biopsy needles, biopsies were obtained using 20 ml syringes at each level, and sent for pathologic analysis.  Through each working needle, a DFine ablation radiofrequency device was advanced and manipulated using biplane intermittent fluoroscopy to create ablation of the involved vertebral bodies.  At T9, total ablation time was 120 seconds achieving a temperature of 51 degrees Celsius.  At T10 in a neutral and inferior position, ablation time was approximately 25 seconds achieving a temperature of 50 Celsius.  At T11 ablations were performed in the neutral position for 3.5 minutes, and the inferior location for 1 minute, and the superior location for 1 minute and contralaterally for 15 seconds.  At each level temperatures reached were between 46 degrees Celsius and 50 degrees Celsius at the distal end of the ablation device.  At T12 ablation was performed in the neutral and inferior aspects of the vertebral body achieving temperatures of 50 degrees Celsius for approximately 1 minute.  The ablation device was removed.  The working spine needles were then advanced to the  junction of the anterior and middle thirds of each of the vertebral bodies.  Methylmethacrylate mixture was then reconstituted with Tobramycin in the DFine mixing system. This was then loaded onto the DFine injector device. The injector devices were then locked into position at the hubs of the needles.  Pulsed delivery of methylmethacrylate mixture was then performed at each of the levels.  Excellent filling was obtained in the AP and lateral projections.  There was no extrusion noted into disc spaces or posteriorly into the spinal canal at each of the levels.  No paraspinous venous contamination was seen.  The needles were then removed.  Hemostasis was achieved at the skin entry sites.  At T12 incomplete crossing of the midline was noted.  This prompted the use of a 13-gauge Cook spine needle through the right pedicle with placement of methylmethacrylate mixture using a Stryker delivery device system.  Again no extravasation was noted into the adjacent paraspinous soft tissues or posteriorly into the spinal canal.  This needle was then removed.  Hemostasis was achieved at the skin entry site.  No acute cardiovascular or neurological changes were seen during the procedure.  The patient's general anesthetic was then reversed.  The patient was then extubated without difficulty.  Upon recovery the patient's neurological status remained at his pre-procedural baseline.  The patient was then transferred to the Neuro PACU for recovery.  IMPRESSION 1. Status post fluoroscopic-guided needle placement for bone biopsy at T9, T10, T11 and T12. 2.  Status post vertebral body radiofrequency ablation followed by augmentation for painful pathologic compression fractures at T9, T10, T11, and T12, using the DFine kyphoplasty technique.  Original Report Authenticated By: Oneal Grout, M.D.    Anti-infectives: Anti-infectives     Start  Dose/Rate Route Frequency Ordered Stop   03/23/12 1100   famciclovir [facility])  tablet 500 mg        500 mg Oral Daily 03/23/12 0958     03/21/12 0800   vancomycin (VANCOCIN) 1,500 mg in sodium chloride 0.9 % 500 mL IVPB        1,500 mg 250 mL/hr over 120 Minutes Intravenous On call 03/20/12 1040 03/21/12 0945          Assessment/Plan: s/p T9-12 RFA/BX/KP 8/13; check path; PT/rad tx ; f/u with Dr. Corliss Skains in 2 weeks as OP    LOS: 14 days    Adina Puzzo,D [facility] 03/23/2012

## 2012-03-24 ENCOUNTER — Ambulatory Visit
Admit: 2012-03-24 | Discharge: 2012-03-24 | Disposition: A | Discharge status: Home / Self Care | Payer: BC Managed Care – PPO | Attending: Radiation Oncology | Admitting: Radiation Oncology

## 2012-03-24 ENCOUNTER — Telehealth: Payer: Self-pay | Admitting: *Deleted

## 2012-03-24 LAB — GLUCOSE, CAPILLARY
Glucose-Capillary: 230 mg/dL — ABNORMAL HIGH (ref 70–99)
Glucose-Capillary: 150 mg/dL — ABNORMAL HIGH (ref 70–99)

## 2012-03-24 MED ORDER — POLYETHYLENE GLYCOL 3350 17 G PO PACK: 17.0000 g | PACK | Freq: Every day | ORAL | Status: AC

## 2012-03-24 MED ORDER — DEXAMETHASONE 6 MG PO TABS: 6.0000 mg | ORAL_TABLET | Freq: Two times a day (BID) | ORAL | Status: AC

## 2012-03-24 MED ORDER — AMLODIPINE BESYLATE 5 MG PO TABS: 5.0000 mg | ORAL_TABLET | Freq: Every day | ORAL | Status: DC

## 2012-03-24 MED ORDER — OXYCODONE HCL 5 MG PO TABS: 5.0000 mg | ORAL_TABLET | ORAL | Status: AC | PRN

## 2012-03-24 MED ORDER — OXYCODONE HCL 20 MG PO TB12: 20.0000 mg | ORAL_TABLET | Freq: Two times a day (BID) | ORAL | Status: DC

## 2012-03-24 MED ORDER — INSULIN ASPART 100 UNIT/ML ~~LOC~~ SOLN: SUBCUTANEOUS | Status: DC

## 2012-03-24 MED ORDER — BACLOFEN 5 MG HALF TABLET: 5.0000 mg | ORAL_TABLET | Freq: Three times a day (TID) | ORAL | Status: DC

## 2012-03-24 MED ORDER — DSS 100 MG PO CAPS: 100.0000 mg | ORAL_CAPSULE | Freq: Two times a day (BID) | ORAL | Status: AC

## 2012-03-24 MED ORDER — ONDANSETRON HCL 4 MG PO TABS: 4.0000 mg | ORAL_TABLET | Freq: Four times a day (QID) | ORAL | Status: AC | PRN

## 2012-03-24 MED ORDER — FAMCICLOVIR 500 MG PO TABS: 500.0000 mg | ORAL_TABLET | Freq: Every day | ORAL | Status: AC

## 2012-03-24 MED ORDER — BISACODYL 5 MG PO TBEC: 10.0000 mg | DELAYED_RELEASE_TABLET | Freq: Every day | ORAL | Status: DC | PRN

## 2012-03-24 MED ORDER — METHOCARBAMOL 500 MG PO TABS: 500.0000 mg | ORAL_TABLET | Freq: Four times a day (QID) | ORAL | Status: DC | PRN

## 2012-03-24 MED ORDER — ZOLPIDEM TARTRATE 5 MG PO TABS: 5.0000 mg | ORAL_TABLET | Freq: Every evening | ORAL | Status: DC | PRN

## 2012-03-24 NOTE — Progress Notes (Signed)
MD,  Patient this evening reports slight numbness (no pain)/ peripheral neuropathy radiating from his left foot to his lower left leg.  Will monitor this and informed patient to notify this nurse if it continues to get worse.Robert Berger

## 2012-03-24 NOTE — Discharge Summary (Signed)
[facility]                                                                                   Robert Berger, is a 71 y.o. male  DOB 1941-07-14  MRN [id number].  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 Arthritis   . GERD (gastroesophageal reflux disease)   . Complication of anesthesia     aspiration during surgery, difficult to wake up    Past Surgical History  Procedure Date  . Fracture surgery     ankle  . Hemorrhoid surgery   . Back surgery     47 years ago     Recommendations for primary care physician for things to follow:   Please make sure patient follows up with below dictated follows.    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 CT scan findings showed expansile lytic lesions involving thoracic and lumbar spine The patient underwent CT guided biopsy of T12 lesion with pathology findings consistent with plasma cell disorder. Diagnostic bone marrow biopsy subsequently done with findings of 11% plasma cells suggestive of multiple myeloma. The patient was being followed by Dr. Myna Hidalgo oncologist along with radiation oncology team he is status post ablation/vertebroplasty procedure of T9-T12 03/21/2012 on 03/21/2012 by IR. His back pain and constipation are much improved, he does have mild tingling in his left leg without any weakness which needs to be closely monitored I would recommend one time outpatient followup with neurosurgery in the next 5-7 days.     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 Corliss Skains interventional radiologist, also getting radiation treatments by Dr. Basilio Cairo, his next chemotherapy treatment will be decided by Dr. Myna Hidalgo his oncologist. Patient's back pain is much improved he needs continued PT OT to help with his deconditioning. Will need close followup with interventional radiologist Dr. Corliss Skains R.,  Dr. Myna Hidalgo and Dr. Basilio Cairo all are detailed below in the followup section.   Patient was already given Zometa 03/13/2012 but the plan is to perhaps give it again to prevent skeletal related events but prior to that he would need dental evaluation outpatient as zometa can cause osteonecrosis of the jaw and this patient already has a poor dentition.   Decadron dose has been changed to oral as per Dr. Myna Hidalgo who I discussed the case with on 03/23/2012. Should is also getting prophylactic treatment for shingles prevention per Dr. Myna Hidalgo, she needs to see Dr. Myna Hidalgo his oncologist in a week to decide on future chemotherapy, Decadron  and famciclovir taper.   Patient does have mild tingling in the left leg which most likely is due to his spine lesion from multiple myeloma and recent surgery with possible mild surrounding edema, continue Decadron continue PT, one time outpatient followup with neurosurgeon as outlined below 5-7 days. If numbness increases or he develops any paresthesias in the perinatal area along with any weakness he should be urgently seen by neurosurgeon. Currently he has no weakness whatsoever and his plantars are downgoing bilaterally. No bowel or bladder incontinence or paresthesias in the perineal area.      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     Hypertension  Stable on 5 mg of daily Norvasc continue.     Steroid-induced hyperglycemia - continue pre-meal sliding scale NovoLog with q. a.c. and at bedtime Accu-Checks   CBG (last 3)   Basename 03/24/12 1148 03/24/12 0745 03/23/12 2156  GLUCAP 187* 150* 230*       Hiatal hernia and fluid filled esophagus / GERD  Continue Protonix daily symptoms much better.    Abdominal pain  Secondary to radiating pain from back related to multiple myeloma  Patient has now had bowel movement on daily basis; continue current bowel regimen  Pain-free today      Today   Subjective:   Robert Berger today has no headache,no chest abdominal pain,no new weakness tingling or numbness, feels much better.  Objective:   Blood pressure 118/58, pulse 76, temperature 98.2 F (36.8 C), temperature source Oral, resp. rate 20, height 5\' 8"  (1.727 m), weight 113.399 kg (250 lb), SpO2 98.00%.   Intake/Output Summary (Last 24 hours) at 03/24/12 1204 Last data filed at 03/24/12 0900  Gross per 24 hour  Intake    240 ml  Output   2125 ml  Net  -1885 ml    Exam Awake Alert, Oriented *3, No  new F.N deficits, Normal affect, and strength 5 x 5 in all 4 extremities plantars down going bilaterally. [place].AT,PERRAL Supple Neck,No JVD, No cervical lymphadenopathy appriciated.  Symmetrical Chest wall movement, Good air movement bilaterally, CTAB RRR,No Gallops,Rubs or new Murmurs, No Parasternal Heave +ve B.Sounds, Abd Soft, Non tender, No organomegaly appriciated, No rebound -guarding or rigidity. No Cyanosis, Clubbing or edema, No new Rash or bruise,  Data Review   Major procedures and Radiology Reports - PLEASE review detailed and final reports for all details in brief -   Kyphoplasty on 03/21/2012   X-ray Chest Pa And Lateral   03/09/2012  *RADIOLOGY REPORT*  Clinical Data: No chest complaints.  Osseous lytic lesions on CT of the abdomen.  CHEST - 2 VIEW  Comparison: Abdominal pelvic CT of earlier today.  Findings: Mild osteopenia.  Maintenance of  thoracic vertebral body height.  Suspect ankylosing spondylitis or diffuse idiopathic skeletal hyperostosis involving the thoracic spine.  Low-volume AP frontal view.  Patient minimally rotated to the right. Normal heart size.  Normal mediastinal contours. No pleural effusion or pneumothorax.  Mildly low lung volumes. Clear lungs.  No other focal osseous lytic lesions are identified.  IMPRESSION: Low lung volumes without acute disease.  Original Report Authenticated By: Consuello Bossier, M.D.   Ct Chest W Contrast  03/10/2012  *RADIOLOGY REPORT*  Clinical Data: Lung cancer.  Lytic bone lesion.  Evaluate for primary.  CT CHEST WITH CONTRAST  Technique:  Multidetector CT imaging of the chest was performed following the standard protocol during bolus administration of intravenous contrast.  Contrast: 80mL OMNIPAQUE IOHEXOL 300 MG/ML  SOLN  Comparison: CT abdomen and bone scan.  Findings: Enlargement of the left thyroid lobe is present, incompletely visualized.  This probably represents thyroid goiter. Coarse calcifications are present in the enlarged  left thyroid lobe.  There is no axillary adenopathy.  No mediastinal or hilar adenopathy. Coronary artery atherosclerosis is present. If office based assessment of coronary risk factors has not been performed, it is now recommended.  Heart grossly appears within normal limits otherwise.  No pericardial effusion.  No pleural effusion.  Dependent atelectasis in the left lung.  Medial right upper lobe atelectasis or scarring with collapse along the right vertebral column.  Incidental imaging of the abdomen appears similar to the recent prior.  Fluid distends the esophagus.  Small hiatal hernia. Small calcified granuloma is present in the lateral right lower lobe (image 36 series 5).  There is a lytic lesion in the right sternal manubrium (image 11 series 5) measuring 15 mm.  Soft tissue density lesions also occupy the T9 and T10 vertebral bodies, best seen on sagittal imaging. There may be some right-sided extraosseous extension at T10.  No definite soft tissue mass in the central canal.  No destructive rib lesion is identified on the right to account for focus of increased radiotracer uptake and this may be secondary to degenerative disease.  Thoracic spine DISH.  IMPRESSION: 1.  Negative for lung cancer. 2.  Soft tissue density osteolytic lesions occupying T9 and T10 vertebral bodies, with possible small amount of extraosseous extension to the right of T10.  Although nonspecific, lymphoma, myeloma and metastatic disease of the primary considerations. Small metastatic lesion in the right sternal manubrium also present. 3.  Small hiatal hernia and fluid distended esophagus.  There may be some esophageal thickening distally.  Consider follow-up endoscopy.  Original Report Authenticated By: Andreas Newport, M.D.   Mr Cervical Spine Wo Contrast  03/14/2012  *RADIOLOGY REPORT*  Clinical Data:  Lytic lesions noted on CT.  Possible myeloma.  Back pain.  MRI CERVICAL AND THORACIC SPINE WITHOUT CONTRAST  Technique:   Multiplanar and multiecho pulse sequences of the cervical spine, to include the craniocervical junction and cervicothoracic junction, and the thoracic spine, were obtained without intravenous contrast.  Comparison:  03/09/2012 CT of the abdomen and pelvis.  08/02 03/28/2012 CT of the chest.  03/10/2012 bone scan.  The present examination was ordered as a cervical, thoracic and lumbar spine MR with contrast however, the patient was not able to tolerate imaging and only a motion degraded MR of the cervical and thoracic spine without contrast was not able to be obtained.  MRI CERVICAL SPINE  Findings:  Motion degraded exam.  Subtle altered signal intensity of bone marrow throughout the cervical spine raises possibility  of involvement by tumor although less pronounced than seen in the thoracic region.  No obvious epidural extension of tumor in the region of the cervical spine.  Small areas of altered signal intensity suggest tumor involvement of portions of the clivus and occipital bone.  C5-6:  Broad-based disc osteophyte complex greater to the right with mild spinal stenosis and mild cord contact.  Uncinate bony overgrowth with moderate bilateral foraminal narrowing greater on the right.  C6-7:  Bulge/osteophyte greater to the right.  Mild spinal stenosis.  Minimal cord contact.  Mild bilateral foraminal narrowing.  IMPRESSION: Motion degraded exam.  Subtle altered signal intensity of bone marrow throughout the cervical spine raises possibility of involvement by tumor although less pronounced than seen in the thoracic region.  No obvious epidural extension of tumor in the region of the cervical spine.  Small areas of altered signal intensity suggest tumor involvement of portions of the clivus and occipital bone.  Degenerative changes C5-6 and C6-7 as noted above.  MRI THORACIC SPINE  Small areas of altered signal intensity involving portions of T 3 through T8 vertebral body and left T6 facet suggestive of involvement  by tumor.  Tumor involvement of a majority of the T9 and T10 vertebral body with extension into the T8-9 disc space and superior aspect of the T11 vertebral body. T9 and T10 tumor involvement extends into the posterior elements and there is mild bony expansion of the posterior aspect of the diseased vertebra greater to left midline without significant spinal stenosis or cord compression. Paraspinal extension tumor extends in the anterior lateral position bilaterally.  Destructive lesion T12 vertebra with 15% loss of height. Epidural extension of tumor with spinal stenosis and crowding of the distal cord / conus.  Extension of tumor into the neural foramen greater at the T12-L1 level with small amount of tumor extension into the T11-12 neural foramen.  Abnormal appearance of the ribs bilaterally.  This may represent result of anemia.  IMPRESSION: Destructive lesion T12 vertebra with epidural and foraminal extension of tumor as detailed above.  This causes spinal stenosis and crowding of the distal cord / conus.  Tumor involvement from the lower T8 to upper T11 with mild bony expansion of the  posterior aspect of the diseased T9 and T10 vertebral body but without cord compression.  Small areas of tumor involvement T3-T8.  Please see above for further detail.  This has been made a PRA call report utilizing dashboard call feature.  Original Report Authenticated By: Fuller Canada, M.D.   Mr Cervical Spine W Contrast  03/15/2012  *RADIOLOGY REPORT*  Clinical Data:  Back pain.  Possible myeloma.  MRI CERVICAL AND THORACIC SPINE WITH CONTRAST  Technique:  Multiplanar and multiecho pulse sequences of the cervical spine, to include the craniocervical junction and cervicothoracic junction, and the thoracic spine, were obtained with contrast.  Contrast: 20mL MULTIHANCE GADOBENATE DIMEGLUMINE 529 MG/ML IV SOLN  Comparison:  Precontrast MR of the cervical and thoracic spine performed 03/14/2012.  MRI CERVICAL SPINE  Findings:   Motion degraded exam.  Abnormal areas of enhancement consistent with involvement by tumor involving portions of the clivus, occipital condyles and all of the cervical vertebra.  Most prominent involvement is that involving the C2, C3 and C5 vertebral body as well as the right C5 facet and left C7 facet.  No obvious epidural extension of tumor noted on this motion degraded examination nor is there evidence of foraminal extension.  Regions of altered signal intensity involving portions  of the cervical cord at the C3 and C5-6 level probably related to motion artifact rather than  involvement by tumor.  Enlarged left thyroid gland.  Tumor not excluded.  Ultrasound can be obtained for further delineation.  IMPRESSION: Motion degraded exam.  Abnormal areas of enhancement consistent with involvement by tumor involving portions of the clivus, occipital condyles and all of the cervical vertebra.  Most prominent involvement is that involving the C2, C3 and C5 vertebral body as well as the right C5 facet and left C7 facet.  No obvious epidural extension of tumor noted on this motion degraded examination nor is there evidence of foraminal extension.  Enlarged left thyroid gland.  Tumor not excluded.  Ultrasound can be obtained for further delineation.  MRI THORACIC SPINE  Findings: Only a significantly motion degraded sagittal post contrast examination of the thoracic spine was able to be obtained secondary to patient discomfort. Tumor involvement of T12 vertebra is better delineated on the lumbar spine MR.  Diffuse tumor involvement extends from the inferior T8 plate through the upper T11 end plate involving a majority of the T9 and T10 vertebral body extending into the posterior elements with mild expansion of the diseased vertebra contributing to the mild spinal stenosis.  Epidural extension of tumor/engorged veins greater on the right extending from T9 to below T12 (most notable epidural tumor at the T12 level).  Diffuse  tumor involvement of the T12 vertebral body involving posterior elements with loss of height centrally inferior aspect. Epidural extension of tumor with crowding of the distal cord / conus.  Foraminal extension of epidural disease more notable T12-L1 then at the T11-T12 level with encroachment upon the exiting T12 nerve roots.  Small areas of tumor involvement T3 through T8 vertebra including T3 and T4 spinous process, left T6 facet and left T8 facet.  IMPRESSION: Only a significantly motion degraded sagittal post contrast examination of the thoracic spine was able to be obtained.  Diffuse tumor involvement extends from the inferior T8 plate through the upper T11 end plate involving a majority of the T9 and T10 vertebral body extending into the posterior elements with mild expansion of the diseased vertebra contributing to the mild spinal stenosis.  Epidural extension of tumor/engorged veins greater on the right extending from T9 to below T12 (most notable epidural tumor at the T12 level).  Diffuse tumor involvement of the T12 vertebral body involving posterior elements with loss of height centrally inferior aspect. Epidural extension of tumor with crowding of the distal cord / conus.  Foraminal extension of epidural disease more notable T12-L1 then at the T11-T12 level with encroachment upon the exiting T12 nerve roots.  Small areas of tumor involvement T3 through T8 vertebra including T3 and T4 spinous process, left T6 facet and left T8 facet.  MRI LUMBAR SPINE WITHOUT AND WITH CONTRAST  Technique:  Multiplanar and multiecho pulse sequences of the lumbar spine were obtained without and with intravenous contrast.  Contrast: 20mL MULTIHANCE GADOBENATE DIMEGLUMINE 529 MG/ML IV SOLN  Comparison:  CT abdomen and pelvis 03/09/2012.  Findings:  Tumor involvement of all of the visualized vertebra from L1 through the lowest aspect of the sacrum imaged (S3). Tumor involvement of the ilium bilaterally. Most notable tumor  involvement L3 vertebral body and right aspect of the sacral ala.  No epidural extension of tumor in the lumbar-sacral spine.  Conus L1 level.  L1-2:  Right posterior lateral/paracentral protrusion.  Slight crowding of the exiting right L1 nerve root and mild impression  upon the right ventral aspect of the thecal sac.  L2-3:  Broad-based disc osteophyte complex greater right lateral position with slight impression upon the exiting right L2 nerve root.  Facet joint degenerative changes.  L3-4:  Baseline broad-based disc osteophyte complex.  Lateral extension touches but does not compress the exiting L3 nerve roots. Small central disc protrusion with minimal indentation upon the thecal sac.  Mild facet joint degenerative changes.  L4-5:  Small to moderate sized broad-based disc protrusion greater to the left.  Left greater than right lateral recess stenosis with mild spinal stenosis.  L5-S1:  Minimal bulge.  IMPRESSION: Tumor involvement of all of the visualized vertebra from L1 through the lowest aspect of the sacrum imaged (S3). Tumor involvement of the ilium bilaterally. Most notable tumor involvement L3 vertebral body and right aspect of the sacral ala.  No epidural extension of tumor in the lumbar-sacral spine.  Lumbar spine degenerative changes as detailed above.  Original Report Authenticated By: Fuller Canada, M.D.   Mr Thoracic Spine Wo Contrast  03/14/2012  *RADIOLOGY REPORT*  Clinical Data:  Lytic lesions noted on CT.  Possible myeloma.  Back pain.  MRI CERVICAL AND THORACIC SPINE WITHOUT CONTRAST  Technique:  Multiplanar and multiecho pulse sequences of the cervical spine, to include the craniocervical junction and cervicothoracic junction, and the thoracic spine, were obtained without intravenous contrast.  Comparison:  03/09/2012 CT of the abdomen and pelvis.  08/02 03/28/2012 CT of the chest.  03/10/2012 bone scan.  The present examination was ordered as a cervical, thoracic and lumbar spine MR with  contrast however, the patient was not able to tolerate imaging and only a motion degraded MR of the cervical and thoracic spine without contrast was not able to be obtained.  MRI CERVICAL SPINE  Findings:  Motion degraded exam.  Subtle altered signal intensity of bone marrow throughout the cervical spine raises possibility of involvement by tumor although less pronounced than seen in the thoracic region.  No obvious epidural extension of tumor in the region of the cervical spine.  Small areas of altered signal intensity suggest tumor involvement of portions of the clivus and occipital bone.  C5-6:  Broad-based disc osteophyte complex greater to the right with mild spinal stenosis and mild cord contact.  Uncinate bony overgrowth with moderate bilateral foraminal narrowing greater on the right.  C6-7:  Bulge/osteophyte greater to the right.  Mild spinal stenosis.  Minimal cord contact.  Mild bilateral foraminal narrowing.  IMPRESSION: Motion degraded exam.  Subtle altered signal intensity of bone marrow throughout the cervical spine raises possibility of involvement by tumor although less pronounced than seen in the thoracic region.  No obvious epidural extension of tumor in the region of the cervical spine.  Small areas of altered signal intensity suggest tumor involvement of portions of the clivus and occipital bone.  Degenerative changes C5-6 and C6-7 as noted above.  MRI THORACIC SPINE  Small areas of altered signal intensity involving portions of T 3 through T8 vertebral body and left T6 facet suggestive of involvement by tumor.  Tumor involvement of a majority of the T9 and T10 vertebral body with extension into the T8-9 disc space and superior aspect of the T11 vertebral body. T9 and T10 tumor involvement extends into the posterior elements and there is mild bony expansion of the posterior aspect of the diseased vertebra greater to left midline without significant spinal stenosis or cord compression. Paraspinal  extension tumor extends in the anterior lateral position  bilaterally.  Destructive lesion T12 vertebra with 15% loss of height. Epidural extension of tumor with spinal stenosis and crowding of the distal cord / conus.  Extension of tumor into the neural foramen greater at the T12-L1 level with small amount of tumor extension into the T11-12 neural foramen.  Abnormal appearance of the ribs bilaterally.  This may represent result of anemia.  IMPRESSION: Destructive lesion T12 vertebra with epidural and foraminal extension of tumor as detailed above.  This causes spinal stenosis and crowding of the distal cord / conus.  Tumor involvement from the lower T8 to upper T11 with mild bony expansion of the  posterior aspect of the diseased T9 and T10 vertebral body but without cord compression.  Small areas of tumor involvement T3-T8.  Please see above for further detail.  This has been made a PRA call report utilizing dashboard call feature.  Original Report Authenticated By: Fuller Canada, M.D.   Mr Thoracic Spine W Contrast  03/15/2012  *RADIOLOGY REPORT*  Clinical Data:  Back pain.  Possible myeloma.  MRI CERVICAL AND THORACIC SPINE WITH CONTRAST  Technique:  Multiplanar and multiecho pulse sequences of the cervical spine, to include the craniocervical junction and cervicothoracic junction, and the thoracic spine, were obtained with contrast.  Contrast: 20mL MULTIHANCE GADOBENATE DIMEGLUMINE 529 MG/ML IV SOLN  Comparison:  Precontrast MR of the cervical and thoracic spine performed 03/14/2012.  MRI CERVICAL SPINE  Findings:  Motion degraded exam.  Abnormal areas of enhancement consistent with involvement by tumor involving portions of the clivus, occipital condyles and all of the cervical vertebra.  Most prominent involvement is that involving the C2, C3 and C5 vertebral body as well as the right C5 facet and left C7 facet.  No obvious epidural extension of tumor noted on this motion degraded examination nor is there  evidence of foraminal extension.  Regions of altered signal intensity involving portions of the cervical cord at the C3 and C5-6 level probably related to motion artifact rather than  involvement by tumor.  Enlarged left thyroid gland.  Tumor not excluded.  Ultrasound can be obtained for further delineation.  IMPRESSION: Motion degraded exam.  Abnormal areas of enhancement consistent with involvement by tumor involving portions of the clivus, occipital condyles and all of the cervical vertebra.  Most prominent involvement is that involving the C2, C3 and C5 vertebral body as well as the right C5 facet and left C7 facet.  No obvious epidural extension of tumor noted on this motion degraded examination nor is there evidence of foraminal extension.  Enlarged left thyroid gland.  Tumor not excluded.  Ultrasound can be obtained for further delineation.  MRI THORACIC SPINE  Findings: Only a significantly motion degraded sagittal post contrast examination of the thoracic spine was able to be obtained secondary to patient discomfort. Tumor involvement of T12 vertebra is better delineated on the lumbar spine MR.  Diffuse tumor involvement extends from the inferior T8 plate through the upper T11 end plate involving a majority of the T9 and T10 vertebral body extending into the posterior elements with mild expansion of the diseased vertebra contributing to the mild spinal stenosis.  Epidural extension of tumor/engorged veins greater on the right extending from T9 to below T12 (most notable epidural tumor at the T12 level).  Diffuse tumor involvement of the T12 vertebral body involving posterior elements with loss of height centrally inferior aspect. Epidural extension of tumor with crowding of the distal cord / conus.  Foraminal extension of epidural  disease more notable T12-L1 then at the T11-T12 level with encroachment upon the exiting T12 nerve roots.  Small areas of tumor involvement T3 through T8 vertebra including T3 and  T4 spinous process, left T6 facet and left T8 facet.  IMPRESSION: Only a significantly motion degraded sagittal post contrast examination of the thoracic spine was able to be obtained.  Diffuse tumor involvement extends from the inferior T8 plate through the upper T11 end plate involving a majority of the T9 and T10 vertebral body extending into the posterior elements with mild expansion of the diseased vertebra contributing to the mild spinal stenosis.  Epidural extension of tumor/engorged veins greater on the right extending from T9 to below T12 (most notable epidural tumor at the T12 level).  Diffuse tumor involvement of the T12 vertebral body involving posterior elements with loss of height centrally inferior aspect. Epidural extension of tumor with crowding of the distal cord / conus.  Foraminal extension of epidural disease more notable T12-L1 then at the T11-T12 level with encroachment upon the exiting T12 nerve roots.  Small areas of tumor involvement T3 through T8 vertebra including T3 and T4 spinous process, left T6 facet and left T8 facet.  MRI LUMBAR SPINE WITHOUT AND WITH CONTRAST  Technique:  Multiplanar and multiecho pulse sequences of the lumbar spine were obtained without and with intravenous contrast.  Contrast: 20mL MULTIHANCE GADOBENATE DIMEGLUMINE 529 MG/ML IV SOLN  Comparison:  CT abdomen and pelvis 03/09/2012.  Findings:  Tumor involvement of all of the visualized vertebra from L1 through the lowest aspect of the sacrum imaged (S3). Tumor involvement of the ilium bilaterally. Most notable tumor involvement L3 vertebral body and right aspect of the sacral ala.  No epidural extension of tumor in the lumbar-sacral spine.  Conus L1 level.  L1-2:  Right posterior lateral/paracentral protrusion.  Slight crowding of the exiting right L1 nerve root and mild impression upon the right ventral aspect of the thecal sac.  L2-3:  Broad-based disc osteophyte complex greater right lateral position with slight  impression upon the exiting right L2 nerve root.  Facet joint degenerative changes.  L3-4:  Baseline broad-based disc osteophyte complex.  Lateral extension touches but does not compress the exiting L3 nerve roots. Small central disc protrusion with minimal indentation upon the thecal sac.  Mild facet joint degenerative changes.  L4-5:  Small to moderate sized broad-based disc protrusion greater to the left.  Left greater than right lateral recess stenosis with mild spinal stenosis.  L5-S1:  Minimal bulge.  IMPRESSION: Tumor involvement of all of the visualized vertebra from L1 through the lowest aspect of the sacrum imaged (S3). Tumor involvement of the ilium bilaterally. Most notable tumor involvement L3 vertebral body and right aspect of the sacral ala.  No epidural extension of tumor in the lumbar-sacral spine.  Lumbar spine degenerative changes as detailed above.  Original Report Authenticated By: Fuller Canada, M.D.   Mr Lumbar Spine W Wo Contrast  03/15/2012  *RADIOLOGY REPORT*  Clinical Data:  Back pain.  Possible myeloma.  MRI CERVICAL AND THORACIC SPINE WITH CONTRAST  Technique:  Multiplanar and multiecho pulse sequences of the cervical spine, to include the craniocervical junction and cervicothoracic junction, and the thoracic spine, were obtained with contrast.  Contrast: 20mL MULTIHANCE GADOBENATE DIMEGLUMINE 529 MG/ML IV SOLN  Comparison:  Precontrast MR of the cervical and thoracic spine performed 03/14/2012.  MRI CERVICAL SPINE  Findings:  Motion degraded exam.  Abnormal areas of enhancement consistent with involvement by tumor  involving portions of the clivus, occipital condyles and all of the cervical vertebra.  Most prominent involvement is that involving the C2, C3 and C5 vertebral body as well as the right C5 facet and left C7 facet.  No obvious epidural extension of tumor noted on this motion degraded examination nor is there evidence of foraminal extension.  Regions of altered signal  intensity involving portions of the cervical cord at the C3 and C5-6 level probably related to motion artifact rather than  involvement by tumor.  Enlarged left thyroid gland.  Tumor not excluded.  Ultrasound can be obtained for further delineation.  IMPRESSION: Motion degraded exam.  Abnormal areas of enhancement consistent with involvement by tumor involving portions of the clivus, occipital condyles and all of the cervical vertebra.  Most prominent involvement is that involving the C2, C3 and C5 vertebral body as well as the right C5 facet and left C7 facet.  No obvious epidural extension of tumor noted on this motion degraded examination nor is there evidence of foraminal extension.  Enlarged left thyroid gland.  Tumor not excluded.  Ultrasound can be obtained for further delineation.  MRI THORACIC SPINE  Findings: Only a significantly motion degraded sagittal post contrast examination of the thoracic spine was able to be obtained secondary to patient discomfort. Tumor involvement of T12 vertebra is better delineated on the lumbar spine MR.  Diffuse tumor involvement extends from the inferior T8 plate through the upper T11 end plate involving a majority of the T9 and T10 vertebral body extending into the posterior elements with mild expansion of the diseased vertebra contributing to the mild spinal stenosis.  Epidural extension of tumor/engorged veins greater on the right extending from T9 to below T12 (most notable epidural tumor at the T12 level).  Diffuse tumor involvement of the T12 vertebral body involving posterior elements with loss of height centrally inferior aspect. Epidural extension of tumor with crowding of the distal cord / conus.  Foraminal extension of epidural disease more notable T12-L1 then at the T11-T12 level with encroachment upon the exiting T12 nerve roots.  Small areas of tumor involvement T3 through T8 vertebra including T3 and T4 spinous process, left T6 facet and left T8 facet.   IMPRESSION: Only a significantly motion degraded sagittal post contrast examination of the thoracic spine was able to be obtained.  Diffuse tumor involvement extends from the inferior T8 plate through the upper T11 end plate involving a majority of the T9 and T10 vertebral body extending into the posterior elements with mild expansion of the diseased vertebra contributing to the mild spinal stenosis.  Epidural extension of tumor/engorged veins greater on the right extending from T9 to below T12 (most notable epidural tumor at the T12 level).  Diffuse tumor involvement of the T12 vertebral body involving posterior elements with loss of height centrally inferior aspect. Epidural extension of tumor with crowding of the distal cord / conus.  Foraminal extension of epidural disease more notable T12-L1 then at the T11-T12 level with encroachment upon the exiting T12 nerve roots.  Small areas of tumor involvement T3 through T8 vertebra including T3 and T4 spinous process, left T6 facet and left T8 facet.  MRI LUMBAR SPINE WITHOUT AND WITH CONTRAST  Technique:  Multiplanar and multiecho pulse sequences of the lumbar spine were obtained without and with intravenous contrast.  Contrast: 20mL MULTIHANCE GADOBENATE DIMEGLUMINE 529 MG/ML IV SOLN  Comparison:  CT abdomen and pelvis 03/09/2012.  Findings:  Tumor involvement of all of the visualized  vertebra from L1 through the lowest aspect of the sacrum imaged (S3). Tumor involvement of the ilium bilaterally. Most notable tumor involvement L3 vertebral body and right aspect of the sacral ala.  No epidural extension of tumor in the lumbar-sacral spine.  Conus L1 level.  L1-2:  Right posterior lateral/paracentral protrusion.  Slight crowding of the exiting right L1 nerve root and mild impression upon the right ventral aspect of the thecal sac.  L2-3:  Broad-based disc osteophyte complex greater right lateral position with slight impression upon the exiting right L2 nerve root.   Facet joint degenerative changes.  L3-4:  Baseline broad-based disc osteophyte complex.  Lateral extension touches but does not compress the exiting L3 nerve roots. Small central disc protrusion with minimal indentation upon the thecal sac.  Mild facet joint degenerative changes.  L4-5:  Small to moderate sized broad-based disc protrusion greater to the left.  Left greater than right lateral recess stenosis with mild spinal stenosis.  L5-S1:  Minimal bulge.  IMPRESSION: Tumor involvement of all of the visualized vertebra from L1 through the lowest aspect of the sacrum imaged (S3). Tumor involvement of the ilium bilaterally. Most notable tumor involvement L3 vertebral body and right aspect of the sacral ala.  No epidural extension of tumor in the lumbar-sacral spine.  Lumbar spine degenerative changes as detailed above.  Original Report Authenticated By: Fuller Canada, M.D.   Nm Bone Scan Whole Body  03/10/2012  *RADIOLOGY REPORT*  Clinical Data: Lumbar spine pain.  Evaluate lytic lesions.  NUCLEAR MEDICINE WHOLE BODY BONE SCINTIGRAPHY  Technique:  Whole body anterior and posterior images were obtained approximately 3 hours after intravenous injection of radiopharmaceutical.  Radiopharmaceutical: CURIE TC-MDP TECHNETIUM TC 19M MEDRONATE IV KIT  Comparison: CT 03/09/2012.  Findings: Increased uptake is present in the T12 vertebra, greater on the right than left.  This corresponds with the lytic lesions seen on recent CT.  This is best seen on the posterior views with increased uptake projected over the pedicles and extension into the posterior elements.  Degenerative changes are present at the knees, shoulders, and ankles.  There is a small right posterior focus of increased uptake in the right ribs, apparently in the right posterior ninth rib. This could be degenerative or represent a tiny focus of metastatic disease not visualized on prior CT.  Retrospective review of the CT was performed and no definite  lytic lesion was present.  IMPRESSION: Increased radiotracer uptake in the T12 vertebra corresponds with the lytic lesion on recent CT, most compatible with neoplasm, likely metastatic disease or myeloma.  Original Report Authenticated By: Andreas Newport, M.D.   US Soft Tissue Head/neck  03/11/2012  *RADIOLOGY REPORT*  Clinical Data: Asymmetric enlargement on recent CT  THYROID ULTRASOUND  Technique: Ultrasound examination of the thyroid gland and adjacent soft tissues was performed.  Comparison:  CT 03/10/2012  Findings:  Right thyroid lobe:  24 x 26 x 56 mm, inhomogeneous Left thyroid lobe:  24 x 35 x 59 mm Isthmus:  5 mm in thickness  Focal nodules:  7 x 8 x 10 mm complex mostly solid, mid-right 7 x 10 x 15 mm complex mostly solid, inferior right 8 x 12 x 14 mm solid, superior left 16 x 19 x 23 mm complex mostly solid, mid-left 12 x 14 x 14 mm hypoechoic solid, inferior left There are least three additional smaller bilateral and isthmic nodules measuring less than 1 cm.  Lymphadenopathy:  None visualized.  IMPRESSION:  Multiple  bilateral thyroid nodules, dominant lesion on the left. Findings meet consensus criteria for biopsy.  Ultrasound-guided fine needle aspiration should be considered, as per the consensus statement: Management of Thyroid Nodules Detected at [place]:  Society of Radiologists in Ultrasound Consensus Conference Statement. Radiology 2005; [id number].  Original Report Authenticated By: Osa Craver, M.D.   Ct Abdomen Pelvis W Contrast  03/09/2012  *RADIOLOGY REPORT*  Clinical Data: LLQ pain, constipation  CT ABDOMEN AND PELVIS WITH CONTRAST  Technique:  Multidetector CT imaging of the abdomen and pelvis was performed following the standard protocol during bolus administration of intravenous contrast.  Contrast: OMNIPAQUE IOHEXOL 300 MG/ML  SOLN  Comparison: None.  Findings: Lung bases are essentially clear.  Small hiatal hernia.  Liver, pancreas, and adrenal glands are within  normal limits.  Gallbladder is mildly distended but without associated inflammatory changes by CT.  No intrahepatic or extrahepatic ductal dilatation.  Kidneys are within normal limits.  No hydronephrosis.  No evidence of bowel obstruction.  Normal appendix.  Colonic diverticulosis, without associated inflammatory changes.  No suspicious abdominopelvic lymphadenopathy.  Prostatomegaly, measuring 6.1 cm in transverse dimension.  Bladder is unremarkable.  Expansile lytic lesion within the left posterior T12 vertebral body (series 2/image 26) with suspected cortical disruption posteriorly (sagittal image 69).  Additional suspected lytic lesions anteriorly in the L1 vertebral body (sagittal image 66) and posteriorly in the L3 vertebral body (sagittal image 63).  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  IMPRESSION: No evidence of bowel obstruction.  Normal appendix.  Colonic diverticulosis, without associated inflammatory changes.  No CT findings to account for the patient's abdominal pain.  Expansile lytic lesion within the left T12 vertebral body with suspected cortical disruption posteriorly.  Additional suspected lytic lesions and L1 and L3.  Primary differential considerations are metastases and myeloma.  Original Report Authenticated By: Charline Bills, M.D.   Ir Kyphoplasty Or Sacroplasty  03/23/2012  *RADIOLOGY REPORT*  Clinical Data:  Patient with severe low back pain secondary to pathologic compression fractures at T9, T10, T11 and T12.  History of multiple myeloma.  BONE TUMOR ABLATION FOLLOWED BY VERTEBRAL BODY AUGMENTATION  Comparison:  MRI scan of the thoracolumbar spine of 03/15/2012.  Following a full explanation of the procedure along with the potential associated complications, an informed witnessed consent was obtained.  The patient was put under general anesthesia by the Department of Anesthesiology at [facility].  The  patient was then placed prone on the fluoroscopic table.  The skin overlying the thoracolumbar region was then prepped and draped in the usual sterile fashion.  The right pedicle at T11, the left pedicle at T12, the left pedicle at T10 and the right pedicle at T9 were then infiltrated with 0.25% bupivacaine.  Using biplane intermittent fluoroscopy, 10.5-gauge DFine spine needle were advanced into the posterior third at each of the levels.  Using separate DFine core bone biopsy needles, biopsies were obtained using 20 ml syringes at each level, and sent for pathologic analysis.  Through each working needle, a DFine ablation radiofrequency device was advanced and manipulated using biplane intermittent fluoroscopy to create ablation of the involved vertebral bodies.  At T9, total ablation time was 120 seconds achieving a temperature of 51 degrees Celsius.  At T10 in a neutral and inferior position, ablation time was approximately 25 seconds achieving a temperature of 50 Celsius.  At T11 ablations were performed in the neutral position for 3.5 minutes, and the inferior location for  1 minute, and the superior location for 1 minute and contralaterally for 15 seconds.  At each level temperatures reached were between 46 degrees Celsius and 50 degrees Celsius at the distal end of the ablation device.  At T12 ablation was performed in the neutral and inferior aspects of the vertebral body achieving temperatures of 50 degrees Celsius for approximately 1 minute.  The ablation device was removed.  The working spine needles were then advanced to the junction of the anterior and middle thirds of each of the vertebral bodies.  Methylmethacrylate mixture was then reconstituted with Tobramycin in the DFine mixing system. This was then loaded onto the DFine injector device. The injector devices were then locked into position at the hubs of the needles.  Pulsed delivery of methylmethacrylate mixture was then performed at each of the  levels.  Excellent filling was obtained in the AP and lateral projections.  There was no extrusion noted into disc spaces or posteriorly into the spinal canal at each of the levels.  No paraspinous venous contamination was seen.  The needles were then removed.  Hemostasis was achieved at the skin entry sites.  At T12 incomplete crossing of the midline was noted.  This prompted the use of a 13-gauge Cook spine needle through the right pedicle with placement of methylmethacrylate mixture using a Stryker delivery device system.  Again no extravasation was noted into the adjacent paraspinous soft tissues or posteriorly into the spinal canal.  This needle was then removed.  Hemostasis was achieved at the skin entry site.  No acute cardiovascular or neurological changes were seen during the procedure.  The patient's general anesthetic was then reversed.  The patient was then extubated without difficulty.  Upon recovery the patient's neurological status remained at his pre-procedural baseline.  The patient was then transferred to the Neuro PACU for recovery.  IMPRESSION 1. Status post fluoroscopic-guided needle placement for bone biopsy at T9, T10, T11 and T12. 2.  Status post vertebral body radiofrequency ablation followed by augmentation for painful pathologic compression fractures at T9, T10, T11, and T12, using the DFine kyphoplasty technique.  Original Report Authenticated By: Oneal Grout, M.D.   Ir Kyphoplasty Or Sacroplasty  03/23/2012  *RADIOLOGY REPORT*  Clinical Data:  Patient with severe low back pain secondary to pathologic compression fractures at T9, T10, T11 and T12.  History of multiple myeloma.  BONE TUMOR ABLATION FOLLOWED BY VERTEBRAL BODY AUGMENTATION  Comparison:  MRI scan of the thoracolumbar spine of 03/15/2012.  Following a full explanation of the procedure along with the potential associated complications, an informed witnessed consent was obtained.  The patient was put under general  anesthesia by the Department of Anesthesiology at [facility].  The patient was then placed prone on the fluoroscopic table.  The skin overlying the thoracolumbar region was then prepped and draped in the usual sterile fashion.  The right pedicle at T11, the left pedicle at T12, the left pedicle at T10 and the right pedicle at T9 were then infiltrated with 0.25% bupivacaine.  Using biplane intermittent fluoroscopy, 10.5-gauge DFine spine needle were advanced into the posterior third at each of the levels.  Using separate DFine core bone biopsy needles, biopsies were obtained using 20 ml syringes at each level, and sent for pathologic analysis.  Through each working needle, a DFine ablation radiofrequency device was advanced and manipulated using biplane intermittent fluoroscopy to create ablation of the involved vertebral bodies.  At T9, total ablation time was 120 seconds achieving  a temperature of 51 degrees Celsius.  At T10 in a neutral and inferior position, ablation time was approximately 25 seconds achieving a temperature of 50 Celsius.  At T11 ablations were performed in the neutral position for 3.5 minutes, and the inferior location for 1 minute, and the superior location for 1 minute and contralaterally for 15 seconds.  At each level temperatures reached were between 46 degrees Celsius and 50 degrees Celsius at the distal end of the ablation device.  At T12 ablation was performed in the neutral and inferior aspects of the vertebral body achieving temperatures of 50 degrees Celsius for approximately 1 minute.  The ablation device was removed.  The working spine needles were then advanced to the junction of the anterior and middle thirds of each of the vertebral bodies.  Methylmethacrylate mixture was then reconstituted with Tobramycin in the DFine mixing system. This was then loaded onto the DFine injector device. The injector devices were then locked into position at the hubs of the needles.  Pulsed  delivery of methylmethacrylate mixture was then performed at each of the levels.  Excellent filling was obtained in the AP and lateral projections.  There was no extrusion noted into disc spaces or posteriorly into the spinal canal at each of the levels.  No paraspinous venous contamination was seen.  The needles were then removed.  Hemostasis was achieved at the skin entry sites.  At T12 incomplete crossing of the midline was noted.  This prompted the use of a 13-gauge Cook spine needle through the right pedicle with placement of methylmethacrylate mixture using a Stryker delivery device system.  Again no extravasation was noted into the adjacent paraspinous soft tissues or posteriorly into the spinal canal.  This needle was then removed.  Hemostasis was achieved at the skin entry site.  No acute cardiovascular or neurological changes were seen during the procedure.  The patient's general anesthetic was then reversed.  The patient was then extubated without difficulty.  Upon recovery the patient's neurological status remained at his pre-procedural baseline.  The patient was then transferred to the Neuro PACU for recovery.  IMPRESSION 1. Status post fluoroscopic-guided needle placement for bone biopsy at T9, T10, T11 and T12. 2.  Status post vertebral body radiofrequency ablation followed by augmentation for painful pathologic compression fractures at T9, T10, T11, and T12, using the DFine kyphoplasty technique.  Original Report Authenticated By: Oneal Grout, M.D.   Ir Kyphoplasty Or Sacroplasty  03/23/2012  *RADIOLOGY REPORT*  Clinical Data:  Patient with severe low back pain secondary to pathologic compression fractures at T9, T10, T11 and T12.  History of multiple myeloma.  BONE TUMOR ABLATION FOLLOWED BY VERTEBRAL BODY AUGMENTATION  Comparison:  MRI scan of the thoracolumbar spine of 03/15/2012.  Following a full explanation of the procedure along with the potential associated complications, an  informed witnessed consent was obtained.  The patient was put under general anesthesia by the Department of Anesthesiology at [facility].  The patient was then placed prone on the fluoroscopic table.  The skin overlying the thoracolumbar region was then prepped and draped in the usual sterile fashion.  The right pedicle at T11, the left pedicle at T12, the left pedicle at T10 and the right pedicle at T9 were then infiltrated with 0.25% bupivacaine.  Using biplane intermittent fluoroscopy, 10.5-gauge DFine spine needle were advanced into the posterior third at each of the levels.  Using separate DFine core bone biopsy needles, biopsies were obtained using 20 ml  syringes at each level, and sent for pathologic analysis.  Through each working needle, a DFine ablation radiofrequency device was advanced and manipulated using biplane intermittent fluoroscopy to create ablation of the involved vertebral bodies.  At T9, total ablation time was 120 seconds achieving a temperature of 51 degrees Celsius.  At T10 in a neutral and inferior position, ablation time was approximately 25 seconds achieving a temperature of 50 Celsius.  At T11 ablations were performed in the neutral position for 3.5 minutes, and the inferior location for 1 minute, and the superior location for 1 minute and contralaterally for 15 seconds.  At each level temperatures reached were between 46 degrees Celsius and 50 degrees Celsius at the distal end of the ablation device.  At T12 ablation was performed in the neutral and inferior aspects of the vertebral body achieving temperatures of 50 degrees Celsius for approximately 1 minute.  The ablation device was removed.  The working spine needles were then advanced to the junction of the anterior and middle thirds of each of the vertebral bodies.  Methylmethacrylate mixture was then reconstituted with Tobramycin in the DFine mixing system. This was then loaded onto the DFine injector device. The  injector devices were then locked into position at the hubs of the needles.  Pulsed delivery of methylmethacrylate mixture was then performed at each of the levels.  Excellent filling was obtained in the AP and lateral projections.  There was no extrusion noted into disc spaces or posteriorly into the spinal canal at each of the levels.  No paraspinous venous contamination was seen.  The needles were then removed.  Hemostasis was achieved at the skin entry sites.  At T12 incomplete crossing of the midline was noted.  This prompted the use of a 13-gauge Cook spine needle through the right pedicle with placement of methylmethacrylate mixture using a Stryker delivery device system.  Again no extravasation was noted into the adjacent paraspinous soft tissues or posteriorly into the spinal canal.  This needle was then removed.  Hemostasis was achieved at the skin entry site.  No acute cardiovascular or neurological changes were seen during the procedure.  The patient's general anesthetic was then reversed.  The patient was then extubated without difficulty.  Upon recovery the patient's neurological status remained at his pre-procedural baseline.  The patient was then transferred to the Neuro PACU for recovery.  IMPRESSION 1. Status post fluoroscopic-guided needle placement for bone biopsy at T9, T10, T11 and T12. 2.  Status post vertebral body radiofrequency ablation followed by augmentation for painful pathologic compression fractures at T9, T10, T11, and T12, using the DFine kyphoplasty technique.  Original Report Authenticated By: Oneal Grout, M.D.   Ir Kyphoplasty Or Sacroplasty  03/23/2012  *RADIOLOGY REPORT*  Clinical Data:  Patient with severe low back pain secondary to pathologic compression fractures at T9, T10, T11 and T12.  History of multiple myeloma.  BONE TUMOR ABLATION FOLLOWED BY VERTEBRAL BODY AUGMENTATION  Comparison:  MRI scan of the thoracolumbar spine of 03/15/2012.  Following a full  explanation of the procedure along with the potential associated complications, an informed witnessed consent was obtained.  The patient was put under general anesthesia by the Department of Anesthesiology at [facility].  The patient was then placed prone on the fluoroscopic table.  The skin overlying the thoracolumbar region was then prepped and draped in the usual sterile fashion.  The right pedicle at T11, the left pedicle at T12, the left pedicle at T10 and  the right pedicle at T9 were then infiltrated with 0.25% bupivacaine.  Using biplane intermittent fluoroscopy, 10.5-gauge DFine spine needle were advanced into the posterior third at each of the levels.  Using separate DFine core bone biopsy needles, biopsies were obtained using 20 ml syringes at each level, and sent for pathologic analysis.  Through each working needle, a DFine ablation radiofrequency device was advanced and manipulated using biplane intermittent fluoroscopy to create ablation of the involved vertebral bodies.  At T9, total ablation time was 120 seconds achieving a temperature of 51 degrees Celsius.  At T10 in a neutral and inferior position, ablation time was approximately 25 seconds achieving a temperature of 50 Celsius.  At T11 ablations were performed in the neutral position for 3.5 minutes, and the inferior location for 1 minute, and the superior location for 1 minute and contralaterally for 15 seconds.  At each level temperatures reached were between 46 degrees Celsius and 50 degrees Celsius at the distal end of the ablation device.  At T12 ablation was performed in the neutral and inferior aspects of the vertebral body achieving temperatures of 50 degrees Celsius for approximately 1 minute.  The ablation device was removed.  The working spine needles were then advanced to the junction of the anterior and middle thirds of each of the vertebral bodies.  Methylmethacrylate mixture was then reconstituted with Tobramycin in the  DFine mixing system. This was then loaded onto the DFine injector device. The injector devices were then locked into position at the hubs of the needles.  Pulsed delivery of methylmethacrylate mixture was then performed at each of the levels.  Excellent filling was obtained in the AP and lateral projections.  There was no extrusion noted into disc spaces or posteriorly into the spinal canal at each of the levels.  No paraspinous venous contamination was seen.  The needles were then removed.  Hemostasis was achieved at the skin entry sites.  At T12 incomplete crossing of the midline was noted.  This prompted the use of a 13-gauge Cook spine needle through the right pedicle with placement of methylmethacrylate mixture using a Stryker delivery device system.  Again no extravasation was noted into the adjacent paraspinous soft tissues or posteriorly into the spinal canal.  This needle was then removed.  Hemostasis was achieved at the skin entry site.  No acute cardiovascular or neurological changes were seen during the procedure.  The patient's general anesthetic was then reversed.  The patient was then extubated without difficulty.  Upon recovery the patient's neurological status remained at his pre-procedural baseline.  The patient was then transferred to the Neuro PACU for recovery.  IMPRESSION 1. Status post fluoroscopic-guided needle placement for bone biopsy at T9, T10, T11 and T12. 2.  Status post vertebral body radiofrequency ablation followed by augmentation for painful pathologic compression fractures at T9, T10, T11, and T12, using the DFine kyphoplasty technique.  Original Report Authenticated By: Oneal Grout, M.D.   US Thyroid Biopsy  03/13/2012  *RADIOLOGY REPORT*  Clinical Data/Indication: Dominant left thyroid nodule  ULTRASOUND-GUIDED BIOPSY OF A DOMINANT LEFT THYROID NODULE.  FINE NEEDLE ASPIRATION.  Procedure: The procedure, risks, benefits, and alternatives were explained to the patient.  Questions regarding the procedure were encouraged and answered. The patient understands and consents to the procedure.  The neck was prepped with betadine in a sterile fashion, and a sterile drape was applied covering the operative field. A mask and sterile gloves were used for the procedure.  Under sonographic guidance,  three 25 gauge fine needle aspirates of the dominant left thyroid nodule were obtained. Final imaging was performed.  Findings: The images document guide needle placement within the abdominal left thyroid nodule. Post biopsy images demonstrate no hemorrhage.  IMPRESSION: Successful ultrasound-guided fine needle aspiration of a dominant left thyroid nodule.  Original Report Authenticated By: Donavan Burnet, M.D.   Ir Bone Tumor Rf Ablation  03/23/2012  *RADIOLOGY REPORT*  Clinical Data:  Patient with severe low back pain secondary to pathologic compression fractures at T9, T10, T11 and T12.  History of multiple myeloma.  BONE TUMOR ABLATION FOLLOWED BY VERTEBRAL BODY AUGMENTATION  Comparison:  MRI scan of the thoracolumbar spine of 03/15/2012.  Following a full explanation of the procedure along with the potential associated complications, an informed witnessed consent was obtained.  The patient was put under general anesthesia by the Department of Anesthesiology at [facility].  The patient was then placed prone on the fluoroscopic table.  The skin overlying the thoracolumbar region was then prepped and draped in the usual sterile fashion.  The right pedicle at T11, the left pedicle at T12, the left pedicle at T10 and the right pedicle at T9 were then infiltrated with 0.25% bupivacaine.  Using biplane intermittent fluoroscopy, 10.5-gauge DFine spine needle were advanced into the posterior third at each of the levels.  Using separate DFine core bone biopsy needles, biopsies were obtained using 20 ml syringes at each level, and sent for pathologic analysis.  Through each working needle, a  DFine ablation radiofrequency device was advanced and manipulated using biplane intermittent fluoroscopy to create ablation of the involved vertebral bodies.  At T9, total ablation time was 120 seconds achieving a temperature of 51 degrees Celsius.  At T10 in a neutral and inferior position, ablation time was approximately 25 seconds achieving a temperature of 50 Celsius.  At T11 ablations were performed in the neutral position for 3.5 minutes, and the inferior location for 1 minute, and the superior location for 1 minute and contralaterally for 15 seconds.  At each level temperatures reached were between 46 degrees Celsius and 50 degrees Celsius at the distal end of the ablation device.  At T12 ablation was performed in the neutral and inferior aspects of the vertebral body achieving temperatures of 50 degrees Celsius for approximately 1 minute.  The ablation device was removed.  The working spine needles were then advanced to the junction of the anterior and middle thirds of each of the vertebral bodies.  Methylmethacrylate mixture was then reconstituted with Tobramycin in the DFine mixing system. This was then loaded onto the DFine injector device. The injector devices were then locked into position at the hubs of the needles.  Pulsed delivery of methylmethacrylate mixture was then performed at each of the levels.  Excellent filling was obtained in the AP and lateral projections.  There was no extrusion noted into disc spaces or posteriorly into the spinal canal at each of the levels.  No paraspinous venous contamination was seen.  The needles were then removed.  Hemostasis was achieved at the skin entry sites.  At T12 incomplete crossing of the midline was noted.  This prompted the use of a 13-gauge Cook spine needle through the right pedicle with placement of methylmethacrylate mixture using a Stryker delivery device system.  Again no extravasation was noted into the adjacent paraspinous soft tissues or  posteriorly into the spinal canal.  This needle was then removed.  Hemostasis was achieved at the skin entry  site.  No acute cardiovascular or neurological changes were seen during the procedure.  The patient's general anesthetic was then reversed.  The patient was then extubated without difficulty.  Upon recovery the patient's neurological status remained at his pre-procedural baseline.  The patient was then transferred to the Neuro PACU for recovery.  IMPRESSION 1. Status post fluoroscopic-guided needle placement for bone biopsy at T9, T10, T11 and T12. 2.  Status post vertebral body radiofrequency ablation followed by augmentation for painful pathologic compression fractures at T9, T10, T11, and T12, using the DFine kyphoplasty technique.  Original Report Authenticated By: Oneal Grout, M.D.   Ir Bone Tumor Rf Ablation  03/23/2012  *RADIOLOGY REPORT*  Clinical Data:  Patient with severe low back pain secondary to pathologic compression fractures at T9, T10, T11 and T12.  History of multiple myeloma.  BONE TUMOR ABLATION FOLLOWED BY VERTEBRAL BODY AUGMENTATION  Comparison:  MRI scan of the thoracolumbar spine of 03/15/2012.  Following a full explanation of the procedure along with the potential associated complications, an informed witnessed consent was obtained.  The patient was put under general anesthesia by the Department of Anesthesiology at [facility].  The patient was then placed prone on the fluoroscopic table.  The skin overlying the thoracolumbar region was then prepped and draped in the usual sterile fashion.  The right pedicle at T11, the left pedicle at T12, the left pedicle at T10 and the right pedicle at T9 were then infiltrated with 0.25% bupivacaine.  Using biplane intermittent fluoroscopy, 10.5-gauge DFine spine needle were advanced into the posterior third at each of the levels.  Using separate DFine core bone biopsy needles, biopsies were obtained using 20 ml syringes at  each level, and sent for pathologic analysis.  Through each working needle, a DFine ablation radiofrequency device was advanced and manipulated using biplane intermittent fluoroscopy to create ablation of the involved vertebral bodies.  At T9, total ablation time was 120 seconds achieving a temperature of 51 degrees Celsius.  At T10 in a neutral and inferior position, ablation time was approximately 25 seconds achieving a temperature of 50 Celsius.  At T11 ablations were performed in the neutral position for 3.5 minutes, and the inferior location for 1 minute, and the superior location for 1 minute and contralaterally for 15 seconds.  At each level temperatures reached were between 46 degrees Celsius and 50 degrees Celsius at the distal end of the ablation device.  At T12 ablation was performed in the neutral and inferior aspects of the vertebral body achieving temperatures of 50 degrees Celsius for approximately 1 minute.  The ablation device was removed.  The working spine needles were then advanced to the junction of the anterior and middle thirds of each of the vertebral bodies.  Methylmethacrylate mixture was then reconstituted with Tobramycin in the DFine mixing system. This was then loaded onto the DFine injector device. The injector devices were then locked into position at the hubs of the needles.  Pulsed delivery of methylmethacrylate mixture was then performed at each of the levels.  Excellent filling was obtained in the AP and lateral projections.  There was no extrusion noted into disc spaces or posteriorly into the spinal canal at each of the levels.  No paraspinous venous contamination was seen.  The needles were then removed.  Hemostasis was achieved at the skin entry sites.  At T12 incomplete crossing of the midline was noted.  This prompted the use of a 13-gauge Cook spine needle through the  right pedicle with placement of methylmethacrylate mixture using a Stryker delivery device system.  Again no  extravasation was noted into the adjacent paraspinous soft tissues or posteriorly into the spinal canal.  This needle was then removed.  Hemostasis was achieved at the skin entry site.  No acute cardiovascular or neurological changes were seen during the procedure.  The patient's general anesthetic was then reversed.  The patient was then extubated without difficulty.  Upon recovery the patient's neurological status remained at his pre-procedural baseline.  The patient was then transferred to the Neuro PACU for recovery.  IMPRESSION 1. Status post fluoroscopic-guided needle placement for bone biopsy at T9, T10, T11 and T12. 2.  Status post vertebral body radiofrequency ablation followed by augmentation for painful pathologic compression fractures at T9, T10, T11, and T12, using the DFine kyphoplasty technique.  Original Report Authenticated By: Oneal Grout, M.D.   Ir Bone Tumor Rf Ablation  03/23/2012  *RADIOLOGY REPORT*  Clinical Data:  Patient with severe low back pain secondary to pathologic compression fractures at T9, T10, T11 and T12.  History of multiple myeloma.  BONE TUMOR ABLATION FOLLOWED BY VERTEBRAL BODY AUGMENTATION  Comparison:  MRI scan of the thoracolumbar spine of 03/15/2012.  Following a full explanation of the procedure along with the potential associated complications, an informed witnessed consent was obtained.  The patient was put under general anesthesia by the Department of Anesthesiology at [facility].  The patient was then placed prone on the fluoroscopic table.  The skin overlying the thoracolumbar region was then prepped and draped in the usual sterile fashion.  The right pedicle at T11, the left pedicle at T12, the left pedicle at T10 and the right pedicle at T9 were then infiltrated with 0.25% bupivacaine.  Using biplane intermittent fluoroscopy, 10.5-gauge DFine spine needle were advanced into the posterior third at each of the levels.  Using separate DFine core  bone biopsy needles, biopsies were obtained using 20 ml syringes at each level, and sent for pathologic analysis.  Through each working needle, a DFine ablation radiofrequency device was advanced and manipulated using biplane intermittent fluoroscopy to create ablation of the involved vertebral bodies.  At T9, total ablation time was 120 seconds achieving a temperature of 51 degrees Celsius.  At T10 in a neutral and inferior position, ablation time was approximately 25 seconds achieving a temperature of 50 Celsius.  At T11 ablations were performed in the neutral position for 3.5 minutes, and the inferior location for 1 minute, and the superior location for 1 minute and contralaterally for 15 seconds.  At each level temperatures reached were between 46 degrees Celsius and 50 degrees Celsius at the distal end of the ablation device.  At T12 ablation was performed in the neutral and inferior aspects of the vertebral body achieving temperatures of 50 degrees Celsius for approximately 1 minute.  The ablation device was removed.  The working spine needles were then advanced to the junction of the anterior and middle thirds of each of the vertebral bodies.  Methylmethacrylate mixture was then reconstituted with Tobramycin in the DFine mixing system. This was then loaded onto the DFine injector device. The injector devices were then locked into position at the hubs of the needles.  Pulsed delivery of methylmethacrylate mixture was then performed at each of the levels.  Excellent filling was obtained in the AP and lateral projections.  There was no extrusion noted into disc spaces or posteriorly into the spinal canal at each of the  levels.  No paraspinous venous contamination was seen.  The needles were then removed.  Hemostasis was achieved at the skin entry sites.  At T12 incomplete crossing of the midline was noted.  This prompted the use of a 13-gauge Cook spine needle through the right pedicle with placement of  methylmethacrylate mixture using a Stryker delivery device system.  Again no extravasation was noted into the adjacent paraspinous soft tissues or posteriorly into the spinal canal.  This needle was then removed.  Hemostasis was achieved at the skin entry site.  No acute cardiovascular or neurological changes were seen during the procedure.  The patient's general anesthetic was then reversed.  The patient was then extubated without difficulty.  Upon recovery the patient's neurological status remained at his pre-procedural baseline.  The patient was then transferred to the Neuro PACU for recovery.  IMPRESSION 1. Status post fluoroscopic-guided needle placement for bone biopsy at T9, T10, T11 and T12. 2.  Status post vertebral body radiofrequency ablation followed by augmentation for painful pathologic compression fractures at T9, T10, T11, and T12, using the DFine kyphoplasty technique.  Original Report Authenticated By: Oneal Grout, M.D.   Ir Bone Tumor Rf Ablation  03/23/2012  *RADIOLOGY REPORT*  Clinical Data:  Patient with severe low back pain secondary to pathologic compression fractures at T9, T10, T11 and T12.  History of multiple myeloma.  BONE TUMOR ABLATION FOLLOWED BY VERTEBRAL BODY AUGMENTATION  Comparison:  MRI scan of the thoracolumbar spine of 03/15/2012.  Following a full explanation of the procedure along with the potential associated complications, an informed witnessed consent was obtained.  The patient was put under general anesthesia by the Department of Anesthesiology at [facility].  The patient was then placed prone on the fluoroscopic table.  The skin overlying the thoracolumbar region was then prepped and draped in the usual sterile fashion.  The right pedicle at T11, the left pedicle at T12, the left pedicle at T10 and the right pedicle at T9 were then infiltrated with 0.25% bupivacaine.  Using biplane intermittent fluoroscopy, 10.5-gauge DFine spine needle were advanced  into the posterior third at each of the levels.  Using separate DFine core bone biopsy needles, biopsies were obtained using 20 ml syringes at each level, and sent for pathologic analysis.  Through each working needle, a DFine ablation radiofrequency device was advanced and manipulated using biplane intermittent fluoroscopy to create ablation of the involved vertebral bodies.  At T9, total ablation time was 120 seconds achieving a temperature of 51 degrees Celsius.  At T10 in a neutral and inferior position, ablation time was approximately 25 seconds achieving a temperature of 50 Celsius.  At T11 ablations were performed in the neutral position for 3.5 minutes, and the inferior location for 1 minute, and the superior location for 1 minute and contralaterally for 15 seconds.  At each level temperatures reached were between 46 degrees Celsius and 50 degrees Celsius at the distal end of the ablation device.  At T12 ablation was performed in the neutral and inferior aspects of the vertebral body achieving temperatures of 50 degrees Celsius for approximately 1 minute.  The ablation device was removed.  The working spine needles were then advanced to the junction of the anterior and middle thirds of each of the vertebral bodies.  Methylmethacrylate mixture was then reconstituted with Tobramycin in the DFine mixing system. This was then loaded onto the DFine injector device. The injector devices were then locked into position at the hubs of  the needles.  Pulsed delivery of methylmethacrylate mixture was then performed at each of the levels.  Excellent filling was obtained in the AP and lateral projections.  There was no extrusion noted into disc spaces or posteriorly into the spinal canal at each of the levels.  No paraspinous venous contamination was seen.  The needles were then removed.  Hemostasis was achieved at the skin entry sites.  At T12 incomplete crossing of the midline was noted.  This prompted the use of a  13-gauge Cook spine needle through the right pedicle with placement of methylmethacrylate mixture using a Stryker delivery device system.  Again no extravasation was noted into the adjacent paraspinous soft tissues or posteriorly into the spinal canal.  This needle was then removed.  Hemostasis was achieved at the skin entry site.  No acute cardiovascular or neurological changes were seen during the procedure.  The patient's general anesthetic was then reversed.  The patient was then extubated without difficulty.  Upon recovery the patient's neurological status remained at his pre-procedural baseline.  The patient was then transferred to the Neuro PACU for recovery.  IMPRESSION 1. Status post fluoroscopic-guided needle placement for bone biopsy at T9, T10, T11 and T12. 2.  Status post vertebral body radiofrequency ablation followed by augmentation for painful pathologic compression fractures at T9, T10, T11, and T12, using the DFine kyphoplasty technique.  Original Report Authenticated By: Oneal Grout, M.D.   Ct Biopsy  03/15/2012  *RADIOLOGY REPORT*  Indication: Evaluate for multiple myeloma  CT GUIDED LEFT ILIAC BONE MARROW ASPIRATION AND BONE MARROW CORE BIOPSY  Intravenous medications: Fentanyl 100 mcg IV; Versed 1 mg IV  Sedation time: 10 minutes  Contrast volume: None  Complications: None immediate  PROCEDURE/FINDINGS:  Informed consent was obtained from the patient following an explanation of the procedure, risks, benefits and alternatives. The patient understands, agrees and consents for the procedure. All questions were addressed.  A time out was performed prior to the initiation of the procedure.  The patient was positioned prone and noncontrast localization CT was performed of the pelvis to demonstrate the iliac marrow spaces.  The operative site was prepped and draped in the usual sterile fashion.  Under sterile conditions and local anesthesia, an 11 gauge coaxial bone biopsy needle was  advanced into the left iliac marrow space. Needle position was confirmed with CT imaging.  Initially, bone marrow aspiration was performed. Next, a bone marrow biopsy was obtained with the 11 gauge outer bone marrow device.  Samples were prepared with the cytotechnologist and deemed adequate.  The needle was removed intact.  Hemostasis was obtained with compression and a dressing was placed. The patient tolerated the procedure well without immediate post procedural complication.  IMPRESSION:  Successful CT guided left iliac bone marrow aspiration and core biopsy.  Original Report Authenticated By: Waynard Reeds, M.D.   Ct Biopsy  03/13/2012  *RADIOLOGY REPORT*  Clinical Data/Indication: T12 VERTEBRAL BODY LESION  CT-GUIDED BIOPSY HAVE A T12 VERTEBRAL BODY LESION.  CORE.  Sedation: Versed 2.0 mg, Fentanyl 100 mcg.  Total Moderate Sedation Time: 30 minutes.  Procedure: The procedure, risks, benefits, and alternatives were explained to the patient. Questions regarding the procedure were encouraged and answered. The patient understands and consents to the procedure.  The low back in the prone position was prepped with betadine in a sterile fashion, and a sterile drape was applied covering the operative field. A sterile gown and sterile gloves were used for the procedure.  Under  CT guidance, the 17 gauge needle was inserted into the T12 vertebral body lesion via left paramedian approach.  Four 18 gauge core biopsies were obtained. Final imaging was performed.  Patient tolerated the procedure well without complication.  Vital sign monitoring by nursing staff during the procedure will continue as patient is in the special procedures unit for post procedure observation.  Findings: The images document guide needle placement within the T12 vertebral body lesion.  Post biopsy images demonstrate no hemorrhage.  IMPRESSION: Successful CT-guided core biopsy of a T12 vertebral body lesion.  Original Report Authenticated By:  Donavan Burnet, M.D.   Dg Bone Survey Met  03/15/2012  *RADIOLOGY REPORT*  Clinical Data: Multiple myeloma.  METASTATIC BONE SURVEY  Comparison: None.  Findings: There is one tiny lytic lesion in the frontal bone of the skull on the lateral view.  There are lytic lesions in T12, T10, T9 and T3 and T4.  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  Posterior elements and the vertebral body are partially destroyed at T12.  There are a few tiny areas of lucency in the pelvic bones and in the proximal femurs bilaterally. There are tiny lucent lesions in the right scapula and proximal right humerus.  The lesions in the sacrum seen on the lumbar MRI are not appreciable on these radiographs.  IMPRESSION: Multiple lesions of multiple myeloma including destruction of the T12 vertebral body.  Lytic lesions elsewhere in the thoracic spine as described and as reported on the thoracic MRI.  Original Report Authenticated By: Gwynn Burly, M.D.      CBC w Diff: Lab Results  Component Value Date   WBC 9.5 03/21/2012   HGB 13.6 03/21/2012   HCT 39.4 03/21/2012   PLT 193 03/21/2012   LYMPHOPCT 24 03/09/2012   MONOPCT 11 03/09/2012   EOSPCT 2 03/09/2012   BASOPCT 1 03/09/2012    CMP: Lab Results  Component Value Date   NA 132* 03/21/2012   K 4.6 03/21/2012   CL 103 03/21/2012   CO2 23 03/21/2012   BUN 19 03/21/2012   CREATININE 0.70 03/23/2012   PROT 9.2* 03/09/2012   ALBUMIN 3.9 03/09/2012   BILITOT 1.2 03/09/2012   ALKPHOS 77 03/09/2012   AST 38* 03/09/2012   ALT 51 03/09/2012  .   Discharge Instructions     Follow with Primary MD [name], MD in 4 days   Get CBC, CMP, checked 4 days by Primary MD and again as instructed by your Primary MD. Get a 2 view Chest X ray done next visit if you had Pneumonia of Lung problems at the Hospital.  Patient needs to follow with the following physicians -  Get Medicines reviewed and adjusted.  Please request your Prim.MD to go over  all Hospital Tests and Procedure/Radiological results at the follow up, please get all Hospital records sent to your Prim MD by signing hospital release before you go home.  Activity: As tolerated with Full fall precautions use walker/cane & assistance as needed   Diet: Heart healthy-low carbohydrate  For Heart failure patients - Check your Weight same time everyday, if you gain over 2 pounds, or you develop in leg swelling, experience more shortness of breath or chest pain, call your Primary MD immediately. Follow Cardiac Low Salt Diet and 1.8 lit/day fluid restriction.  Disposition rehabilitation - [facility] in [place].  If you experience worsening of your admission symptoms, develop shortness of breath, life  threatening emergency, suicidal or homicidal thoughts you must seek medical attention immediately by calling 911 or calling your MD immediately  if symptoms less severe.  You Must read complete instructions/literature along with all the possible adverse reactions/side effects for all the Medicines you take and that have been prescribed to you. Take any new Medicines after you have completely understood and accpet all the possible adverse reactions/side effects.   Do not drive if your were admitted for syncope or siezures until you have seen by Primary MD or a Neurologist and advised to drive.  Do not drive when taking Pain medications.    Do not take more than prescribed Pain, Sleep and Anxiety Medications  Special Instructions: If you have smoked or chewed Tobacco  in the last 2 yrs please stop smoking, stop any regular Alcohol  and or any Recreational drug use.  Wear Seat belts while driving.  Follow-up Information    Follow up with Oliver Barre, MD. (appointment at 0930am)    Contact information:   [address] [facility] [address] [place] [address] [place] [address] [phone]       Follow up with [name], MD. (please call for appointment)    Contact  information:   [address], [address] [place] [address] [phone]       Follow up with [facility]. (please follow up with worker's comp PT,call for next appointment)       Follow up with Oneal Grout, MD. Schedule an appointment as soon as possible for a visit in 2 weeks.   Contact information:   [address] [place] [address] [phone]       Follow up with Romero Belling, MD. Schedule an appointment as soon as possible for a visit in 1 week.   Contact information:   [address] [facility] [address] [place] [address] [phone]       Follow up with HATCHER, STEVEN L, DDS. Schedule an appointment as soon as possible for a visit in 1 week.   Contact information:   [address] [place] [address] [phone]       Follow up with Reinaldo Meeker, MD. Schedule an appointment as soon as possible for a visit in 3 days.   Contact information:   [address] [place] [address] [phone]       Follow up with Buckner Malta, MD. Schedule an appointment as soon as possible for a visit in 3 days.   Contact information:   [address] [place] [address] [phone]            Discharge Medications   Medication List  As of 03/24/2012 12:04 PM   START taking these medications         amLODipine 5 MG tablet   Commonly known as: NORVASC   Take 1 tablet (5 mg total) by mouth daily.      baclofen 5 mg Tabs   Commonly known as: LIORESAL   Take 0.5 tablets (5 mg total) by mouth 3 (three) times daily.      bisacodyl 5 MG EC tablet   Commonly known as: DULCOLAX   Take 2 tablets (10 mg total) by mouth daily as needed.      dexamethasone 6 MG tablet   Commonly known as: DECADRON   Take 1 tablet (6 mg total) by mouth 2 (two) times daily.  DSS 100 MG Caps   Take 100 mg by mouth 2 (two) times daily.       famciclovir 500 MG tablet   Commonly known as: FAMVIR   Take 1 tablet (500 mg total) by mouth daily.      insulin aspart 100 UNIT/ML injection   Commonly known as: novoLOG   Before each meal 3 times a day, 140-199 - 2 units, 200-250 - 4 units, 251-299 - 6 units,  300-349 - 8 units,  350 or above 10 units.      methocarbamol 500 MG tablet   Commonly known as: ROBAXIN   Take 1 tablet (500 mg total) by mouth every 6 (six) hours as needed.      ondansetron 4 MG tablet   Commonly known as: ZOFRAN   Take 1 tablet (4 mg total) by mouth every 6 (six) hours as needed for nausea.      * oxyCODONE 5 MG immediate release tablet   Commonly known as: Oxy IR/ROXICODONE   Take 1 tablet (5 mg total) by mouth every 4 (four) hours as needed.      * oxyCODONE 20 MG 12 hr tablet   Commonly known as: OXYCONTIN   Take 1 tablet (20 mg total) by mouth every 12 (twelve) hours.      polyethylene glycol packet   Commonly known as: MIRALAX / GLYCOLAX   Take 17 g by mouth daily.      zolpidem 5 MG tablet   Commonly known as: AMBIEN   Take 1 tablet (5 mg total) by mouth at bedtime as needed for sleep.     * Notice: This list has 2 medication(s) that are the same as other medications prescribed for you. Read the directions carefully, and ask your doctor or other care provider to review them with you.       CONTINUE taking these medications         omeprazole 20 MG capsule   Commonly known as: PRILOSEC         STOP taking these medications         naproxen 250 MG tablet          Where to get your medications    These are the prescriptions that you need to pick up.   You may get these medications from any pharmacy.         baclofen 5 mg Tabs   methocarbamol 500 MG tablet   oxyCODONE 20 MG 12 hr tablet   oxyCODONE 5 MG immediate release tablet   zolpidem 5 MG tablet         Information on where to get these meds is not yet available. Ask your nurse or doctor.         amLODipine 5 MG  tablet   bisacodyl 5 MG EC tablet   dexamethasone 6 MG tablet   DSS 100 MG Caps   famciclovir 500 MG tablet   insulin aspart 100 UNIT/ML injection   ondansetron 4 MG tablet   polyethylene glycol packet               Total Time in preparing paper work, data evaluation and todays exam - 35 minutes  Leroy Sea M.D on 03/24/2012 at 12:04 PM  [facility] Office  [phone]

## 2012-03-24 NOTE — Telephone Encounter (Signed)
Called [name] and confirmed that Mr Robert Berger is a patient.  His primary caregiver is Physiological scientist.   Faxed a copy of the schedule at 7:40pm.  Requested patient arrive at 2:30pm on Monday to ensure he is on time for the 2:50PM TREATMENT.  Phone number to call regarding patient care is [phone].  Fax number is:[phone]

## 2012-03-24 NOTE — Progress Notes (Signed)
Physical Therapy Treatment Patient Details Name: Eladio Dentremont MRN: [id number] DOB: 1940-10-18 Today's Date: 03/24/2012 Time: 4098-1191 PT Time Calculation (min): 35 min  PT Assessment / Plan / Recommendation Comments on Treatment Session  Pt. tolerated ambulation well. Pt. will need to be mod. Independent in order to DC to home.agree w/ SNF    Follow Up Recommendations  Skilled nursing facility    Barriers to Discharge        Equipment Recommendations  Rolling walker with 5" wheels    Recommendations for Other Services    Frequency Min 3X/week   Plan Discharge plan remains appropriate;Frequency remains appropriate    Precautions / Restrictions Precautions Precautions: Fall   Pertinent Vitals/Pain Back 5/10 premedicated.    Mobility  Bed Mobility Bed Mobility: Sit to Sidelying Right Rolling Left: 5: Supervision Left Sidelying to Sit: 4: Min guard Sit to Supine: 5: Supervision Details for Bed Mobility Assistance: Pt  actually rolls over and gets up on his hands and knees to get up to edge of bed. Transfers Transfers: Sit to Stand;Stand to Sit Sit to Stand: 4: Min assist;With upper extremity assist;From bed Stand to Sit: 4: Min guard;5: Supervision;To bed Ambulation/Gait Ambulation/Gait Assistance: 4: Min assist Ambulation Distance (Feet): 80 Feet Assistive device: Rolling walker Ambulation/Gait Assistance Details: Pt has less increase in R back pain.  Gait Pattern: Step-to pattern Gait velocity: decreased    Exercises     PT Diagnosis:    PT Problem List:   PT Treatment Interventions:     PT Goals Acute Rehab PT Goals Pt will Roll Supine to Right Side: with modified independence PT Goal: Rolling Supine to Right Side - Progress: Progressing toward goal Pt will go Supine/Side to Sit: with modified independence PT Goal: Supine/Side to Sit - Progress: Progressing toward goal Pt will go Sit to Stand: with modified independence PT Goal: Sit to Stand - Progress:  Progressing toward goal Pt will Ambulate: >150 feet;with modified independence;with least restrictive assistive device PT Goal: Ambulate - Progress: Progressing toward goal  Visit Information  Last PT Received On: 03/24/12 Assistance Needed: +1    Subjective Data  Subjective: I need to know where I am going.   Cognition  Overall Cognitive Status: Appears within functional limits for tasks assessed/performed Arousal/Alertness: Awake/alert Behavior During Session: [facility] for tasks performed    Balance     End of Session PT - End of Session Activity Tolerance: Patient tolerated treatment well Patient left: in bed;with call bell/phone within reach;with family/visitor present Nurse Communication: Mobility status   GP     Rada Hay 03/24/2012, 3:53 [id number]

## 2012-03-27 ENCOUNTER — Ambulatory Visit
Admit: 2012-03-27 | Discharge: 2012-03-27 | Disposition: A | Discharge status: Home / Self Care | Payer: BC Managed Care – PPO | Attending: Radiation Oncology | Admitting: Radiation Oncology

## 2012-03-27 ENCOUNTER — Ambulatory Visit
Admission: RE | Admit: 2012-03-27 | Discharge: 2012-03-27 | Disposition: A | Discharge status: Home / Self Care | Payer: BC Managed Care – PPO | Source: Ambulatory Visit | Attending: Radiation Oncology | Admitting: Radiation Oncology

## 2012-03-27 DIAGNOSIS — C9000 Multiple myeloma not having achieved remission: Secondary | ICD-10-CM

## 2012-03-27 NOTE — Progress Notes (Addendum)
Pt currently residing at rehab center for PT. He states he is virtually pain free unless sitting for periods of time. He states standing is less painful than sitting. Pt denies loss of appetite, fatigue. Post sim ed completed w/pt; gave pt "Radiation and You" booklet, contact info for [facility], RN.  Pt questions when he will have chemotherapy. Informed pt will call Dr Gustavo Lah office to discuss w/RN.  Received call from Amy, RN, Dr Gustavo Lah office. She states pt will not require concurrent chemo-radiation tx. Pt will have FU w/Dr Myna Hidalgo; she will try to contact pt.

## 2012-03-27 NOTE — Progress Notes (Signed)
Weekly Management Note:  Site:T8-L4 spine Current Dose:  600  cGy Projected Dose: 2000  cGy  Narrative: The patient is seen today for routine under treatment assessment. CBCT/MVCT images/port films were reviewed. The chart was reviewed.   He resides at the rehabilitation center in his pain is under good control. He does have discomfort when sitting up for more than 2 minutes at a time. He started Velcade chemotherapy last Friday.  Physical Examination: There were no vitals filed for this visit..  Weight:  . No significant skin changes. Neurologic examination unchanged.  Impression: Tolerating radiation therapy well.  Plan: Continue radiation therapy as planned.

## 2012-03-28 ENCOUNTER — Ambulatory Visit
Admission: RE | Admit: 2012-03-28 | Discharge: 2012-03-28 | Disposition: A | Discharge status: Home / Self Care | Payer: BC Managed Care – PPO | Source: Ambulatory Visit | Attending: Radiation Oncology | Admitting: Radiation Oncology

## 2012-03-28 NOTE — Clinical Social Work Placement (Signed)
     Clinical Social Work Department CLINICAL SOCIAL WORK PLACEMENT NOTE 03/28/2012  Patient:  Robert Berger, Robert Berger  Account Number:  [id number] Admit date:  03/09/2012  Clinical Social Worker:  Reece Levy, Theresia Majors  Date/time:  03/22/2012 12:53 PM  Clinical Social Work is seeking post-discharge placement for this patient at the following level of care:   SKILLED NURSING   (*CSW will update this form in Epic as items are completed)   03/22/2012  Patient/family provided with [facility] Department of Clinical Social Works list of facilities offering this level of care within the geographic area requested by the patient (or if unable, by the patients family).  03/22/2012  Patient/family informed of their freedom to choose among providers that offer the needed level of care, that participate in Medicare, Medicaid or managed care program needed by the patient, have an available bed and are willing to accept the patient.  03/22/2012  Patient/family informed of [facility] ownership interest in [facility], as well as of the fact that they are under no obligation to receive care at this facility.  PASARR submitted to EDS on 03/22/2012 PASARR number received from EDS on 03/24/2012  FL2 transmitted to all facilities in geographic area requested by pt/family on  03/22/2012 FL2 transmitted to all facilities within larger geographic area on   Patient informed that his/her managed care company has contracts with or will negotiate with  certain facilities, including the following:     Patient/family informed of bed offers received:  03/27/2012 Patient chooses bed at [facility] [place] Physician recommends and patient chooses bed at    Patient to be transferred to [facility] [place] on  03/24/2012 Patient to be transferred to facility by EMS  The following physician request were entered in Epic:   Additional Comments:

## 2012-03-29 ENCOUNTER — Ambulatory Visit
Admission: RE | Admit: 2012-03-29 | Discharge: 2012-03-29 | Disposition: A | Discharge status: Home / Self Care | Payer: BC Managed Care – PPO | Source: Ambulatory Visit | Attending: Radiation Oncology | Admitting: Radiation Oncology

## 2012-03-30 ENCOUNTER — Ambulatory Visit
Admit: 2012-03-30 | Discharge: 2012-03-30 | Disposition: A | Discharge status: Home / Self Care | Payer: BC Managed Care – PPO | Attending: Radiation Oncology | Admitting: Radiation Oncology

## 2012-03-30 ENCOUNTER — Telehealth: Payer: Self-pay | Admitting: Hematology & Oncology

## 2012-03-30 NOTE — Telephone Encounter (Signed)
Chip Boer from [facility] called patient has follow-up appointment on 8-26. Per Dr. Myna Hidalgo scheduled with [facility]

## 2012-03-31 ENCOUNTER — Ambulatory Visit
Admission: RE | Admit: 2012-03-31 | Discharge: 2012-03-31 | Disposition: A | Discharge status: Home / Self Care | Payer: BC Managed Care – PPO | Source: Ambulatory Visit | Attending: Radiation Oncology | Admitting: Radiation Oncology

## 2012-04-03 ENCOUNTER — Ambulatory Visit
Admit: 2012-04-03 | Discharge: 2012-04-03 | Disposition: A | Discharge status: Home / Self Care | Payer: BC Managed Care – PPO | Attending: Radiation Oncology | Admitting: Radiation Oncology

## 2012-04-03 ENCOUNTER — Telehealth: Payer: Self-pay | Admitting: Hematology & Oncology

## 2012-04-03 ENCOUNTER — Other Ambulatory Visit: Payer: BC Managed Care – PPO | Admitting: Lab

## 2012-04-03 ENCOUNTER — Ambulatory Visit: Payer: BC Managed Care – PPO | Admitting: Medical

## 2012-04-03 NOTE — Telephone Encounter (Signed)
Left message for patient to call back for changed appt date and time.  Patient called back and is aware of 04/05/12 appt.  He states, he will be here.

## 2012-04-03 NOTE — Telephone Encounter (Signed)
Patient called and cx 04/03/12 appt and resch for 04/04/12.  Nurse was notified of the appt changes

## 2012-04-04 ENCOUNTER — Other Ambulatory Visit: Payer: BC Managed Care – PPO | Admitting: Lab

## 2012-04-04 ENCOUNTER — Ambulatory Visit: Payer: BC Managed Care – PPO | Admitting: Medical

## 2012-04-04 ENCOUNTER — Ambulatory Visit
Admit: 2012-04-04 | Discharge: 2012-04-04 | Disposition: A | Discharge status: Home / Self Care | Payer: BC Managed Care – PPO | Attending: Radiation Oncology | Admitting: Radiation Oncology

## 2012-04-04 ENCOUNTER — Ambulatory Visit (HOSPITAL_COMMUNITY): Payer: BC Managed Care – PPO

## 2012-04-05 ENCOUNTER — Ambulatory Visit
Admission: RE | Admit: 2012-04-05 | Discharge: 2012-04-05 | Disposition: A | Discharge status: Home / Self Care | Payer: BC Managed Care – PPO | Source: Ambulatory Visit | Attending: Radiation Oncology | Admitting: Radiation Oncology

## 2012-04-05 ENCOUNTER — Ambulatory Visit (HOSPITAL_BASED_OUTPATIENT_CLINIC_OR_DEPARTMENT_OTHER): Payer: Medicare Other | Admitting: Medical

## 2012-04-05 ENCOUNTER — Encounter: Payer: Self-pay | Admitting: Radiation Oncology

## 2012-04-05 ENCOUNTER — Ambulatory Visit (HOSPITAL_BASED_OUTPATIENT_CLINIC_OR_DEPARTMENT_OTHER): Payer: Medicare Other | Admitting: Lab

## 2012-04-05 ENCOUNTER — Ambulatory Visit
Admit: 2012-04-05 | Discharge: 2012-04-05 | Disposition: A | Discharge status: Home / Self Care | Payer: BC Managed Care – PPO | Attending: Radiation Oncology | Admitting: Radiation Oncology

## 2012-04-05 VITALS — BP 115/84 | HR 77 | Temp 98.0°F | Resp 22 | Ht 68.0 in | Wt 230.0 lb

## 2012-04-05 DIAGNOSIS — C9000 Multiple myeloma not having achieved remission: Secondary | ICD-10-CM

## 2012-04-05 LAB — CBC WITH DIFFERENTIAL (CANCER CENTER ONLY)
BASO#: 0 10*3/uL (ref 0.0–0.2)
HCT: 39.6 % (ref 38.7–49.9)
HGB: 14.1 g/dL (ref 13.0–17.1)
LYMPH#: 0.1 10*3/uL — ABNORMAL LOW (ref 0.9–3.3)
LYMPH%: 1.3 % — ABNORMAL LOW (ref 14.0–48.0)
MCHC: 35.6 g/dL (ref 32.0–35.9)
MCV: 93 fL (ref 82–98)
MONO#: 0.5 10*3/uL (ref 0.1–0.9)
NEUT%: 94.1 % — ABNORMAL HIGH (ref 40.0–80.0)
WBC: 10.7 10*3/uL — ABNORMAL HIGH (ref 4.0–10.0)

## 2012-04-05 LAB — CMP (CANCER CENTER ONLY)
ALT(SGPT): 74 U/L — ABNORMAL HIGH (ref 10–47)
AST: 33 U/L (ref 11–38)
Albumin: 2.6 g/dL — ABNORMAL LOW (ref 3.3–5.5)
Alkaline Phosphatase: 62 U/L (ref 26–84)
Potassium: 5.2 mEq/L — ABNORMAL HIGH (ref 3.3–4.7)
Sodium: 132 mEq/L (ref 128–145)
Total Protein: 5.4 g/dL — ABNORMAL LOW (ref 6.4–8.1)

## 2012-04-05 NOTE — Progress Notes (Signed)
   Weekly Management Note Completed Radiotherapy. Total Dose:  20Gy   Narrative:  The patient presents for routine under treatment assessment on last day of radiotherapy.  CBCT/MVCT images/Port film x-rays were reviewed.  The chart was checked.  Back pain is better. Status post one cycle of Velcade.  Possible discharge from rehabilitation in 2 days. He says that its most difficult to sit upright. He is able to walk for fairly long distances MRI on his back. He reports numbness in his toes bilaterally. He denies nausea vomiting or diarrhea.  Physical Findings:  vitals were not taken for this visit. lying on a gurney, conversant and alert.  Impression:  The patient has tolerated radiotherapy.  Plan:  Routine follow-up in one month. ________________________________   Lonie Peak, M.D.

## 2012-04-05 NOTE — Patient Instructions (Signed)
We will start you chemotherapy next week. He we'll get weekly for 3 weeks. We'll also give you a bone medicine to strengthen your spine.  Make sure you a to well. Please try to get around as much as possible. Use your walker to help you ambulate.  Did not get dehydrated. Please drink a lot of fluid.  If you have any bleeding or any problems with temperature over 102 please let us know.  AB have a lot of problems with pain please let us know.

## 2012-04-05 NOTE — Progress Notes (Signed)
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  Current therapy:Velcade (3 weeks on and one week off), and Zometa #1 Patient is status post one dose of velcade out of a three week cycle as an inpatient. #2 patient is status post 1 dose of Zometa as an inpatient. #3 patient is status post  IR bone tumor Radiofrequency ablation at T9, T10, T11, and T12, with kyphoplasty augmentation on 03/21/2012  #4. The patient is currently receiving radiotherapy of T8 through the majority of L4  Interim history: Robert Berger presents today for an office followup visit after being discharged from the hospital.  Robert Berger is a very pleasant, 71 year old, gentleman, who was recently diagnosed with multiple myeloma.  Robert Berger did have quite an extensive workup in the hospital.  He does not have a lot of protein, but yet.  His biopsies are clearly showing myeloma.  He did receive an IR bone tumor Radiofrequency ablation at T9, T10, T11, and T12, with kyphoplasty augmentation on 03/21/2012 .  For compression fraction of the thoracic spine.  Robert Berger, reports, that this did significantly help in terms of his pain.  His only pain is when he sits up for a couple of minutes.  He was transported today by stretcher just to make sure.  He would be comfortable, but normally he is able to walk with a walker.  Robert Berger was discharged from the hospital to a rehabilitation to receive physical therapy and will be discharged home this Friday.  Overall, Robert Berger, reports, that his pain is fairly well controlled.  He had been receiving OxyContin and oxycodone at rehabilitation, but only takes it on an as needed basis.  Robert Berger, reports, that he has a decreased appetite.  He's not having any nausea, vomiting, diarrhea, or constipation.  He denies any chest pain, or shortness of breath.  He denies any cough, fevers, chills, or night sweats.  He did have an unintentional weight loss of 10 pounds in the past month.  He is  positive for GERD symptoms.  He denies any obvious, or abnormal bleeding.  He denies any rashes, headaches or vision, changes.  It was explained to Robert Berger on the hospital, that we will continue with Velcade 3 weeks on one-week off along with Zometa once a month.  For the time being.  We will hold off on Revlimid as Robert Berger is quite sedentary at this point and is high-risk for a DVT or PE.  Once.  Robert Berger becomes more active.  We can go ahead and initiate Revlimid.  Review of Systems: decreased appetite, intermittent lower back pain, otherwise: Pt. Denies any changes in their vision, hearing, adenopathy, fevers, chills, nausea, vomiting, diarrhea, constipation, chest pain, shortness of breath, passing blood, passing out, blacking out,  any changes in skin, joints, neurologic or psychiatric except as noted.  Physical Exam: This is a pleasant, somewhat disheveled appearing, 71 year old, gentleman, in no obvious distress. Vitals: Temperature 98.0 degrees, pulse 77, respirations 22 blood pressure 115/84.  Weight 230 pounds HEENT reveals a normocephalic, atraumatic skull, no scleral icterus, no oral lesions  Neck is supple without any cervical or supraclavicular adenopathy.  Lungs are clear to auscultation bilaterally. There are no wheezes, rales or rhonci Cardiac is regular rate and rhythm with a normal S1 and S2. There are no murmurs, rubs, or bruits.  Abdomen is soft with good bowel sounds, there is no palpable mass. There is no palpable hepatosplenomegaly. There is no palpable fluid wave.  Musculoskeletal no tenderness of the spine, ribs, or hips.  Extremities there are no clubbing, cyanosis, or edema.  Skin no petechia, purpura or ecchymosis Neurologic is nonfocal.  Laboratory Data: White count 10.7, hemoglobin 14.1, hematocrit 39.6, platelets 65,000 On 03/13/2012.  His M spike was 1.86 g/dL, IgG 5621, 308 mg/dL, beta-2 microglobulin 6..57, kappa free light chain 1.6, 0 mg/dL His 84-ONGE  urine, that was done in the hospital, had 5.3 mg protein.  There was no monoclonal Bence-Jones protein noted in his 24-hour urine.  Metastatic bone survey: 03/15/2012 Impression: Multiple lesions of multiple myeloma including destruction of the T12 vertebral body.  Lytic lesions elsewhere in the thoracic spine.  MRI of the lumbar spine with and without contrast: 03/15/2012 Impression: Tumor involvement of all of the visualized vertebral from L1 through the lowest aspect of the sacrum.  Tumor involvement of the ileum bilaterally.  Most notable tumor involvement.  L3 vertebral body, and right aspect of the sacral ala. No epidural extension of tumor in the lumbar, sacral spine. Lumbar spine, degenerative changes.  Current Outpatient Prescriptions on File Prior to Visit  Medication Sig Dispense Refill  . amLODipine (NORVASC) 5 MG tablet Take 1 tablet (5 mg total) by mouth daily.      . baclofen (LIORESAL) 5 mg TABS Take 0.5 tablets (5 mg total) by mouth 3 (three) times daily.  20 tablet  0  . bisacodyl (DULCOLAX) 5 MG EC tablet Take 2 tablets (10 mg total) by mouth daily as needed.  30 tablet    . insulin aspart (NOVOLOG) 100 UNIT/ML injection Before each meal 3 times a day, 140-199 - 2 units, 200-250 - 4 units, 251-299 - 6 units,  300-349 - 8 units,  350 or above 10 units.  1 vial    . omeprazole (PRILOSEC) 20 MG capsule Take 20 mg by mouth daily as needed. GERD      . oxyCODONE (OXYCONTIN) 20 MG 12 hr tablet Take 1 tablet (20 mg total) by mouth every 12 (twelve) hours.  20 tablet  0   Assessment/Plan: This is a pleasant-appearing 71 year old, gentleman, with the following issues:  #1 new diagnosis of multiple myeloma-he has had one dose of Velcade while in the hospital.  He's also had one dose Zometa in the hospital.  We will go ahead and continue to treat him with Velcade and Zometa for the time being.  He will receive Velcade 3 weeks on and one-week off.  For the time being, we will not treat.   Robert Berger with Velcade as he is high risk for DVT or PE.  At some point down the road, once.  Robert Berger is more mobile, we may go ahead and start him on Revlimid.  Robert Berger will continue with radio therapy.  Again, he is status post radiofrequency ablation at T9-T12 with kyphoplasty augmentation on 03/21/2012  #2 bony lesions-he will continue on Zometa 4 mg IV every 4 weeks  #3 pain-he currently is on OxyContin and oxycodone as needed.  #4.  Followup- I will plan on seeing Robert Berger.  Back on 05/10/2012, but before then should there be questions or concerns.

## 2012-04-06 ENCOUNTER — Ambulatory Visit: Payer: BC Managed Care – PPO

## 2012-04-06 NOTE — Progress Notes (Deleted)
°  Radiation Oncology         [phone] ________________________________  Name: Robert Berger MRN: [id number]  Date: 04/05/2012  DOB: [birth year]  End of Treatment Note  Diagnosis:   Multiple myeloma  Indication for treatment: palliative    Radiation treatment dates:   03/23/2012-04/05/2012  Site/dose:  T8-L4 spine / 20 Gy/64fractions  Beams/energy:  AP/PA / 18 MV photons  Narrative: The patient tolerated radiation treatment relatively well.    Plan: The patient has completed radiation treatment. The patient will return to radiation oncology clinic for routine followup in one month. I advised them to call or return sooner if they have any questions or concerns related to their recovery or treatment.  -----------------------------------  Lonie Peak, MD

## 2012-04-07 ENCOUNTER — Ambulatory Visit: Payer: BC Managed Care – PPO

## 2012-04-07 LAB — PROTEIN ELECTROPHORESIS, SERUM, WITH REFLEX
M-Spike, %: 0.57 g/dL
Total Protein, Serum Electrophoresis: 5.4 g/dL — ABNORMAL LOW (ref 6.0–8.3)

## 2012-04-07 LAB — IGG, IGA, IGM
IgG (Immunoglobin G), Serum: 802 mg/dL (ref 650–1600)
IgM, Serum: 87 mg/dL (ref 41–251)

## 2012-04-07 LAB — KAPPA/LAMBDA LIGHT CHAINS: Kappa:Lambda Ratio: 0.11 — ABNORMAL LOW (ref 0.26–1.65)

## 2012-04-11 ENCOUNTER — Ambulatory Visit (HOSPITAL_COMMUNITY)
Admission: RE | Admit: 2012-04-11 | Discharge: 2012-04-11 | Disposition: A | Discharge status: Home / Self Care | Payer: BC Managed Care – PPO | Source: Ambulatory Visit | Attending: Interventional Radiology | Admitting: Interventional Radiology

## 2012-04-11 ENCOUNTER — Ambulatory Visit: Payer: BC Managed Care – PPO

## 2012-04-12 ENCOUNTER — Ambulatory Visit: Payer: BC Managed Care – PPO

## 2012-04-13 ENCOUNTER — Ambulatory Visit: Payer: BC Managed Care – PPO

## 2012-04-13 ENCOUNTER — Other Ambulatory Visit (HOSPITAL_BASED_OUTPATIENT_CLINIC_OR_DEPARTMENT_OTHER): Payer: Medicare Other | Admitting: Lab

## 2012-04-13 ENCOUNTER — Ambulatory Visit (HOSPITAL_BASED_OUTPATIENT_CLINIC_OR_DEPARTMENT_OTHER): Payer: Medicare Other

## 2012-04-13 ENCOUNTER — Encounter: Payer: Self-pay | Admitting: Hematology & Oncology

## 2012-04-13 ENCOUNTER — Other Ambulatory Visit: Payer: Medicare Other

## 2012-04-13 VITALS — BP 135/90 | HR 77 | Temp 98.4°F | Resp 20

## 2012-04-13 DIAGNOSIS — Z5112 Encounter for antineoplastic immunotherapy: Secondary | ICD-10-CM

## 2012-04-13 DIAGNOSIS — C9000 Multiple myeloma not having achieved remission: Secondary | ICD-10-CM

## 2012-04-13 LAB — CBC WITH DIFFERENTIAL (CANCER CENTER ONLY)
Eosinophils Absolute: 0 10*3/uL (ref 0.0–0.5)
LYMPH%: 2.9 % — ABNORMAL LOW (ref 14.0–48.0)
MCH: 33 pg (ref 28.0–33.4)
MCV: 93 fL (ref 82–98)
MONO%: 3.8 % (ref 0.0–13.0)
Platelets: 52 10*3/uL — ABNORMAL LOW (ref 145–400)
RBC: 4.51 10*6/uL (ref 4.20–5.70)
RDW: 13.8 % (ref 11.1–15.7)

## 2012-04-13 MED ORDER — SODIUM CHLORIDE 0.9 % IV SOLN
Freq: Once | INTRAVENOUS | Status: AC
  Administered 2012-04-13: 10:00:00 via INTRAVENOUS

## 2012-04-13 MED ORDER — ZOLEDRONIC ACID 4 MG/100ML IV SOLN
4.0000 mg | Freq: Once | INTRAVENOUS | Status: AC
  Administered 2012-04-13: 4 mg via INTRAVENOUS
  Filled 2012-04-13: qty 100

## 2012-04-13 MED ORDER — BORTEZOMIB CHEMO SQ INJECTION 3.5 MG (2.5MG/ML)
1.3000 mg/m2 | Freq: Once | INTRAMUSCULAR | Status: AC
  Administered 2012-04-13: 3 mg via SUBCUTANEOUS
  Filled 2012-04-13: qty 3

## 2012-04-13 MED ORDER — ONDANSETRON HCL 8 MG PO TABS
8.0000 mg | ORAL_TABLET | Freq: Once | ORAL | Status: AC
  Administered 2012-04-13: 8 mg via ORAL

## 2012-04-13 NOTE — Progress Notes (Signed)
04/13/2012 Dr. Myna Hidalgo aware of platelet count 52, ok to treat. Teola Bradley, Johanan Skorupski [employer]

## 2012-04-13 NOTE — Patient Instructions (Addendum)
Zoledronic Acid injection (Hypercalcemia, Oncology) What is this medicine? ZOLEDRONIC ACID (ZOE le dron ik AS id) lowers the amount of calcium loss from bone. It is used to treat too much calcium in your blood from cancer. It is also used to prevent complications of cancer that has spread to the bone. This medicine may be used for other purposes; ask your health care provider or pharmacist if you have questions. What should I tell my health care provider before I take this medicine? They need to know if you have any of these conditions: -aspirin-sensitive asthma -dental disease -kidney disease -an unusual or allergic reaction to zoledronic acid, other medicines, foods, dyes, or preservatives -pregnant or trying to get pregnant -breast-feeding How should I use this medicine? This medicine is for infusion into a vein. It is given by a health care professional in a hospital or clinic setting. Talk to your pediatrician regarding the use of this medicine in children. Special care may be needed. Overdosage: If you think you have taken too much of this medicine contact a poison control center or emergency room at once. NOTE: This medicine is only for you. Do not share this medicine with others. What if I miss a dose? It is important not to miss your dose. Call your doctor or health care professional if you are unable to keep an appointment. What may interact with this medicine? -certain antibiotics given by injection -NSAIDs, medicines for pain and inflammation, like ibuprofen or naproxen -some diuretics like bumetanide, furosemide -teriparatide -thalidomide This list may not describe all possible interactions. Give your health care provider a list of all the medicines, herbs, non-prescription drugs, or dietary supplements you use. Also tell them if you smoke, drink alcohol, or use illegal drugs. Some items may interact with your medicine. What should I watch for while using this medicine? Visit  your doctor or health care professional for regular checkups. It may be some time before you see the benefit from this medicine. Do not stop taking your medicine unless your doctor tells you to. Your doctor may order blood tests or other tests to see how you are doing. Women should inform their doctor if they wish to become pregnant or think they might be pregnant. There is a potential for serious side effects to an unborn child. Talk to your health care professional or pharmacist for more information. You should make sure that you get enough calcium and vitamin D while you are taking this medicine. Discuss the foods you eat and the vitamins you take with your health care professional. Some people who take this medicine have severe bone, joint, and/or muscle pain. This medicine may also increase your risk for a broken thigh bone. Tell your doctor right away if you have pain in your upper leg or groin. Tell your doctor if you have any pain that does not go away or that gets worse. What side effects may I notice from receiving this medicine? Side effects that you should report to your doctor or health care professional as soon as possible: -allergic reactions like skin rash, itching or hives, swelling of the face, lips, or tongue -anxiety, confusion, or depression -breathing problems -changes in vision -feeling faint or lightheaded, falls -jaw burning, cramping, pain -muscle cramps, stiffness, or weakness -trouble passing urine or change in the amount of urine Side effects that usually do not require medical attention (report to your doctor or health care professional if they continue or are bothersome): -bone, joint, or muscle pain -  fever -hair loss -irritation at site where injected -loss of appetite -nausea, vomiting -stomach upset -tired This list may not describe all possible side effects. Call your doctor for medical advice about side effects. You may report side effects to FDA at  1-800-FDA-1088. Where should I keep my medicine? This drug is given in a hospital or clinic and will not be stored at home. NOTE: This sheet is a summary. It may not cover all possible information. If you have questions about this medicine, talk to your doctor, pharmacist, or health care provider.  2012, [employer] Standard. (01/22/2011 9:06:58 AM)Bortezomib injection What is this medicine? BORTEZOMIB (bor TEZ oh mib) is a chemotherapy drug. It slows the growth of cancer cells. This medicine is used to treat multiple myeloma, lymphoma, and other cancers. This medicine may be used for other purposes; ask your health care provider or pharmacist if you have questions. What should I tell my health care provider before I take this medicine? They need to know if you have any of these conditions: -heart disease -irregular heartbeat -liver disease -low blood counts, like low white blood cells, platelets, or hemoglobin -peripheral neuropathy -taking medicine for blood pressure -an unusual or allergic reaction to bortezomib, mannitol, boron, other medicines, foods, dyes, or preservatives -pregnant or trying to get pregnant -breast-feeding How should I use this medicine? This medicine is for injection into a vein or for injection under the skin. It is given by a health care professional in a hospital or clinic setting. Talk to your pediatrician regarding the use of this medicine in children. Special care may be needed. Overdosage: If you think you have taken too much of this medicine contact a poison control center or emergency room at once. NOTE: This medicine is only for you. Do not share this medicine with others. What if I miss a dose? It is important not to miss your dose. Call your doctor or health care professional if you are unable to keep an appointment. What may interact with this medicine? -medicines for diabetes -medicines to increase blood counts like filgrastim, pegfilgrastim,  sargramostim -zalcitabine Talk to your doctor or health care professional before taking any of these medicines: -acetaminophen -aspirin -ibuprofen -ketoprofen -naproxen This list may not describe all possible interactions. Give your health care provider a list of all the medicines, herbs, non-prescription drugs, or dietary supplements you use. Also tell them if you smoke, drink alcohol, or use illegal drugs. Some items may interact with your medicine. What should I watch for while using this medicine? Visit your doctor for checks on your progress. This drug may make you feel generally unwell. This is not uncommon, as chemotherapy can affect healthy cells as well as cancer cells. Report any side effects. Continue your course of treatment even though you feel ill unless your doctor tells you to stop. You may get drowsy or dizzy. Do not drive, use machinery, or do anything that needs mental alertness until you know how this medicine affects you. Do not stand or sit up quickly, especially if you are an older patient. This reduces the risk of dizzy or fainting spells. In some cases, you may be given additional medicines to help with side effects. Follow all directions for their use. Call your doctor or health care professional for advice if you get a fever, chills or sore throat, or other symptoms of a cold or flu. Do not treat yourself. This drug decreases your body's ability to fight infections. Try to avoid being around people   who are sick. This medicine may increase your risk to bruise or bleed. Call your doctor or health care professional if you notice any unusual bleeding. Be careful brushing and flossing your teeth or using a toothpick because you may get an infection or bleed more easily. If you have any dental work done, tell your dentist you are receiving this medicine. Avoid taking products that contain aspirin, acetaminophen, ibuprofen, naproxen, or ketoprofen unless instructed by your doctor.  These medicines may hide a fever. Do not become pregnant while taking this medicine. Women should inform their doctor if they wish to become pregnant or think they might be pregnant. There is a potential for serious side effects to an unborn child. Talk to your health care professional or pharmacist for more information. Do not breast-feed an infant while taking this medicine. You may have vomiting or diarrhea while taking this medicine. Drink water or other fluids as directed. What side effects may I notice from receiving this medicine? Side effects that you should report to your doctor or health care professional as soon as possible: -allergic reactions like skin rash, itching or hives, swelling of the face, lips, or tongue -breathing problems -changes in hearing -changes in vision -fast, irregular heartbeat -feeling faint or lightheaded, falls -pain, tingling, numbness in the hands or feet -seizures -swelling of the ankles, feet, hands -unusual bleeding or bruising -unusually weak or tired -vomiting Side effects that usually do not require medical attention (report to your doctor or health care professional if they continue or are bothersome): -changes in emotions or moods -constipation -diarrhea -loss of appetite -headache -irritation at site where injected -nausea This list may not describe all possible side effects. Call your doctor for medical advice about side effects. You may report side effects to FDA at 1-800-FDA-1088. Where should I keep my medicine? This drug is given in a hospital or clinic and will not be stored at home. NOTE: This sheet is a summary. It may not cover all possible information. If you have questions about this medicine, talk to your doctor, pharmacist, or health care provider.  2012, [employer] Standard. (09/02/2010 11:42:36 AM) 

## 2012-04-14 ENCOUNTER — Other Ambulatory Visit: Payer: Self-pay | Admitting: Hematology & Oncology

## 2012-04-14 ENCOUNTER — Other Ambulatory Visit: Payer: BC Managed Care – PPO | Admitting: Lab

## 2012-04-14 ENCOUNTER — Ambulatory Visit: Payer: BC Managed Care – PPO | Admitting: Hematology & Oncology

## 2012-04-18 NOTE — Progress Notes (Signed)
°  Radiation Oncology         [phone] °________________________________ ° °Name: Robert Berger MRN: [id number]  °Date: 04/05/2012  DOB: 04/18/1941 ° °End of Treatment Note ° °Diagnosis:   Multiple myeloma ° °Indication for treatment: palliative   ° °Radiation treatment dates:   03/23/2012-04/05/2012 ° °Site/dose:  T8-L4 spine / 20 Gy/10fractions ° °Beams/energy:  AP/PA / 18 MV photons ° °Narrative: The patient tolerated radiation treatment relatively well.   ° °Plan: The patient has completed radiation treatment. The patient will return to radiation oncology clinic for routine followup in one month. I advised them to call or return sooner if they have any questions or concerns related to their recovery or treatment. ° °----------------------------------- ° °Alfonso Carden, MD ° °

## 2012-04-20 ENCOUNTER — Other Ambulatory Visit (HOSPITAL_BASED_OUTPATIENT_CLINIC_OR_DEPARTMENT_OTHER): Payer: Medicare Other | Admitting: Lab

## 2012-04-20 ENCOUNTER — Ambulatory Visit (HOSPITAL_BASED_OUTPATIENT_CLINIC_OR_DEPARTMENT_OTHER): Payer: Medicare Other

## 2012-04-20 VITALS — BP 145/92 | HR 103 | Temp 97.9°F | Resp 20

## 2012-04-20 DIAGNOSIS — C9000 Multiple myeloma not having achieved remission: Secondary | ICD-10-CM

## 2012-04-20 DIAGNOSIS — Z5112 Encounter for antineoplastic immunotherapy: Secondary | ICD-10-CM

## 2012-04-20 LAB — CBC WITH DIFFERENTIAL (CANCER CENTER ONLY)
BASO%: 0.1 % (ref 0.0–2.0)
EOS%: 0 % (ref 0.0–7.0)
LYMPH%: 3 % — ABNORMAL LOW (ref 14.0–48.0)
MCV: 93 fL (ref 82–98)
MONO#: 0.2 10*3/uL (ref 0.1–0.9)
MONO%: 2.6 % (ref 0.0–13.0)
Platelets: 68 10*3/uL — ABNORMAL LOW (ref 145–400)
RDW: 13.6 % (ref 11.1–15.7)
WBC: 7 10*3/uL (ref 4.0–10.0)

## 2012-04-20 MED ORDER — BORTEZOMIB CHEMO SQ INJECTION 3.5 MG (2.5MG/ML)
1.3000 mg/m2 | Freq: Once | INTRAMUSCULAR | Status: AC
  Administered 2012-04-20: 3 mg via SUBCUTANEOUS
  Filled 2012-04-20: qty 1.2

## 2012-04-20 MED ORDER — ONDANSETRON HCL 8 MG PO TABS
8.0000 mg | ORAL_TABLET | Freq: Once | ORAL | Status: AC
  Administered 2012-04-20: 8 mg via ORAL

## 2012-04-20 NOTE — Patient Instructions (Signed)

## 2012-04-21 ENCOUNTER — Emergency Department (HOSPITAL_COMMUNITY)
Admission: EM | Admit: 2012-04-21 | Discharge: 2012-04-21 | Disposition: A | Discharge status: Home / Self Care | Payer: BC Managed Care – PPO | Attending: Emergency Medicine | Admitting: Emergency Medicine

## 2012-04-21 ENCOUNTER — Telehealth: Payer: Self-pay | Admitting: *Deleted

## 2012-04-21 ENCOUNTER — Encounter (HOSPITAL_COMMUNITY): Payer: Self-pay | Admitting: *Deleted

## 2012-04-21 DIAGNOSIS — R531 Weakness: Secondary | ICD-10-CM

## 2012-04-21 DIAGNOSIS — M009 Pyogenic arthritis, unspecified: Secondary | ICD-10-CM | POA: Insufficient documentation

## 2012-04-21 DIAGNOSIS — C9000 Multiple myeloma not having achieved remission: Secondary | ICD-10-CM | POA: Insufficient documentation

## 2012-04-21 DIAGNOSIS — R5383 Other fatigue: Secondary | ICD-10-CM | POA: Insufficient documentation

## 2012-04-21 DIAGNOSIS — M71129 Other infective bursitis, unspecified elbow: Secondary | ICD-10-CM

## 2012-04-21 DIAGNOSIS — K219 Gastro-esophageal reflux disease without esophagitis: Secondary | ICD-10-CM | POA: Insufficient documentation

## 2012-04-21 DIAGNOSIS — Z880 Allergy status to penicillin: Secondary | ICD-10-CM | POA: Insufficient documentation

## 2012-04-21 DIAGNOSIS — R000 Tachycardia, unspecified: Secondary | ICD-10-CM | POA: Insufficient documentation

## 2012-04-21 DIAGNOSIS — Z87891 Personal history of nicotine dependence: Secondary | ICD-10-CM | POA: Insufficient documentation

## 2012-04-21 DIAGNOSIS — Z79899 Other long term (current) drug therapy: Secondary | ICD-10-CM | POA: Insufficient documentation

## 2012-04-21 DIAGNOSIS — R5381 Other malaise: Secondary | ICD-10-CM | POA: Insufficient documentation

## 2012-04-21 LAB — CBC WITH DIFFERENTIAL/PLATELET
Eosinophils Relative: 0 % (ref 0–5)
HCT: 40.7 % (ref 39.0–52.0)
Lymphs Abs: 0.1 10*3/uL — ABNORMAL LOW (ref 0.7–4.0)
MCV: 91.5 fL (ref 78.0–100.0)
Monocytes Relative: 4 % (ref 3–12)
Platelets: 51 10*3/uL — ABNORMAL LOW (ref 150–400)
RBC: 4.45 MIL/uL (ref 4.22–5.81)
WBC: 5.4 10*3/uL (ref 4.0–10.5)

## 2012-04-21 LAB — COMPREHENSIVE METABOLIC PANEL
ALT: 192 U/L — ABNORMAL HIGH (ref 0–53)
AST: 110 U/L — ABNORMAL HIGH (ref 0–37)
Alkaline Phosphatase: 74 U/L (ref 39–117)
CO2: 25 mEq/L (ref 19–32)
Chloride: 96 mEq/L (ref 96–112)
Creatinine, Ser: 0.46 mg/dL — ABNORMAL LOW (ref 0.50–1.35)
GFR calc non Af Amer: >90 mL/min (ref >90)
Potassium: 4.9 mEq/L (ref 3.5–5.1)
Total Bilirubin: 0.7 mg/dL (ref 0.3–1.2)

## 2012-04-21 MED ORDER — LIDOCAINE HCL 1 % IJ SOLN
INTRAMUSCULAR | Status: AC
  Administered 2012-04-21: 22:00:00
  Filled 2012-04-21: qty 20

## 2012-04-21 MED ORDER — SODIUM CHLORIDE 0.9 % IV BOLUS (SEPSIS)
1000.0000 mL | Freq: Once | INTRAVENOUS | Status: AC
  Administered 2012-04-21: 1000 mL via INTRAVENOUS

## 2012-04-21 MED ORDER — SULFAMETHOXAZOLE-TRIMETHOPRIM 800-160 MG PO TABS: 1.0000 | ORAL_TABLET | Freq: Two times a day (BID) | ORAL | Status: AC

## 2012-04-21 MED ORDER — SULFAMETHOXAZOLE-TMP DS 800-160 MG PO TABS
1.0000 | ORAL_TABLET | Freq: Once | ORAL | Status: AC
  Administered 2012-04-21: 1 via ORAL
  Filled 2012-04-21: qty 1

## 2012-04-21 NOTE — ED Notes (Addendum)
Please see triage note.  Swab given to pt-complains of dry mouth.  Pt reports noticing L elbow swelling, redness and pain today.  Warm to touch.  Pt reports receiving chemo yesterday, states that he's become severely weak since.  Pt's family at bedside reports pt have gotten so weak that pt is unable to stand up to use the BR.

## 2012-04-21 NOTE — ED Notes (Signed)
Pt is here for weakness,  He resides at home,  States he was diagnosed this past July with multiple myeloma and slowing declining,  Wife is at bedside,  Pt also has complaints of left elbow pain with fluid,  Dr Anitra Lauth to drain  Fluid,  Per Dr Anitra Lauth request,  Lidocaine and needles at bedside

## 2012-04-21 NOTE — Telephone Encounter (Signed)
Received a call from PT who was seeing the pt at home. She is concerned about his pulse rate ranging between 120 - 130 and O2 sat of 90%. His BP is approx 150/80-90s. Rates his pain around 4. Encouraged him to be evaluated in the ER as he is at documented higher risk for DVT/PE due to immobility. Spoke directly to the pt. He agreed to this. Will have the ambulance transport him.

## 2012-04-21 NOTE — ED Notes (Signed)
Per EMS, pt from home, reports weakness.  Recently had back surgery x 1 week ago, reports severe back pain, unable to ambulate d/t weakness.   Pt is a Ca pt.  Last chemo tx was yesterday.  Pt is A&Ox 4.  L also requesting to have his L elbow evaluated d/t pain and redness.  Denies any injury at present.

## 2012-04-21 NOTE — ED Provider Notes (Addendum)
History     CSN: [id number]  Arrival date & time 04/21/12  1753   First MD Initiated Contact with Patient 04/21/12 1841      Chief Complaint  Patient presents with  . Weakness    (Consider location/radiation/quality/duration/timing/severity/associated sxs/prior treatment) HPI Comments: Patient is also complaining of elbow pain that is 10 out of 10 that started in the last 24 hours. The pain does not radiate but is worse with palpation of the elbow. He states this has happened once before but had gone away spontaneously  Patient is a 71 y.o. male presenting with weakness. The history is provided by the patient.  Weakness Primary symptoms do not include focal weakness, loss of sensation, nausea or vomiting. Primary symptoms comment: Generalized weakness The symptoms began 12 to 24 hours ago. The episode lasted 1 day. The symptoms are unchanged. The neurological symptoms are diffuse. Context: Started after his second dose of chemotherapy yesterday.  Additional symptoms include weakness. Medical issues also include cancer.    Past Medical History  Diagnosis Date  . Arthritis   . GERD (gastroesophageal reflux disease)   . Complication of anesthesia     aspiration during surgery, difficult to wake up    Past Surgical History  Procedure Date  . Fracture surgery     ankle  . Hemorrhoid surgery   . Back surgery     47 years ago    Family History  Problem Relation Age of Onset  . Cancer Maternal Grandfather   . Cancer Maternal Grandmother     History  Substance Use Topics  . Smoking status: Former Smoker    Types: Cigarettes    Quit date: 03/09/1978  . Smokeless tobacco: Never Used  . Alcohol Use: Yes     1 glass of wine or a burbon daily.      Review of Systems  Gastrointestinal: Negative for nausea and vomiting.  Neurological: Positive for weakness. Negative for focal weakness.  All other systems reviewed and are negative.    Allergies  Penicillins  Home  Medications   Current Outpatient Rx  Name Route Sig Dispense Refill  . AMLODIPINE BESYLATE 5 MG PO TABS Oral Take 5 mg by mouth daily.    Marland Kitchen BACLOFEN 5 MG HALF TABLET Oral Take 5 mg by mouth 3 (three) times daily.    Marland Kitchen BISACODYL 5 MG PO TBEC Oral Take 10 mg by mouth daily as needed. Constipation    . DEXAMETHASONE 4 MG PO TABS  6 mg Twice daily.    Marland Kitchen FAMCICLOVIR 500 MG PO TABS Oral Take 500 mg by mouth Daily.     Marland Kitchen ONDANSETRON HCL 4 MG PO TABS Oral Take 4 mg by mouth every 8 (eight) hours as needed. Nausea    . OXYCODONE HCL 5 MG PO TABS Oral Take 5 mg by mouth every 4 (four) hours as needed. Breakthrough pain    . OXYCODONE HCL ER 20 MG PO TB12 Oral Take 20 mg by mouth every 12 (twelve) hours.    Marland Kitchen ZOLPIDEM TARTRATE 5 MG PO TABS  5 mg At bedtime as needed. sleep      BP 133/83  Pulse 112  Temp 97.9 F (36.6 C) (Oral)  Resp 16  SpO2 95%  Physical Exam  Nursing note and vitals reviewed. Constitutional: He is oriented to person, place, and time. He appears well-developed and well-nourished. No distress.  HENT:  Head: Normocephalic and atraumatic.  Mouth/Throat: Oropharynx is clear and moist. Mucous membranes are  dry.  Eyes: Conjunctivae normal and EOM are normal. Pupils are equal, round, and reactive to light.  Neck: Normal range of motion. Neck supple.  Cardiovascular: Normal rate, regular rhythm and intact distal pulses.   No murmur heard. Pulmonary/Chest: Effort normal and breath sounds normal. No respiratory distress. He has no wheezes. He has no rales.  Abdominal: Soft. He exhibits no distension. There is no tenderness. There is no rebound and no guarding.  Musculoskeletal: Normal range of motion. He exhibits no edema and no tenderness.       Arms: Neurological: He is alert and oriented to person, place, and time. No sensory deficit.       4/5 muscle strength in bilateral lower extremities. 4/5 muscle strength in bilateral hands, biceps, triceps  Skin: Skin is warm and dry. No  rash noted. No erythema.  Psychiatric: He has a normal mood and affect. His behavior is normal.    ED Course  Procedures (including critical care time)  Labs Reviewed  CBC WITH DIFFERENTIAL - Abnormal; Notable for the following:    Platelets 51 (*)     Neutrophils Relative 94 (*)     Lymphocytes Relative 2 (*)     Lymphs Abs 0.1 (*)     All other components within normal limits  COMPREHENSIVE METABOLIC PANEL - Abnormal; Notable for the following:    Sodium 131 (*)     Glucose, Bld 345 (*)     Creatinine, Ser 0.46 (*)     Calcium 8.2 (*)     Total Protein 5.2 (*)     Albumin 2.3 (*)     AST 110 (*)     ALT 192 (*)     All other components within normal limits  CK  URINALYSIS, ROUTINE W REFLEX MICROSCOPIC  WOUND CULTURE   No results found.  INCISION AND DRAINAGE Performed by: Gwyneth Sprout Consent: Verbal consent obtained. Risks and benefits: risks, benefits and alternatives were discussed Type: abscess  Body area: Left olecranon bursa  Anesthesia: local infiltration  Local anesthetic: lidocaine 1 % without epinephrine  Anesthetic total: 5 ml  Complexity: Simple A 23-gauge needle was inserted   Drainage: purulent  Drainage amount: 5 cc   Packing material: none Patient tolerance: Patient tolerated the procedure well with no immediate complications.     No diagnosis found.    MDM   Patient is here complaining of diffuse weakness that started today after receiving his second dose of chemotherapy yesterday for multiple myeloma. He did have kyphoplasty done of his back 2 weeks ago and radiation however he is not complaining of any back pain or solely leg weakness. The weakness is diffuse and arms and legs. He is able to pick up both legs and his sensation is unchanged him his surgery. He is neurovascularly intact with 2+ pulses bilaterally. Patient's vital signs are normal except for mild tachycardia. He has no vomiting or abdominal pain. He denies cough,  shortness of breath or chest pain. Labs indicate new mild elevation in his LFTs which will be watched by his oncologist. Spoke with the on-call oncologist and discussed lab tests and he felt the patient was feeling better he can go home. No signs of rhabdo. Secondly patient has a septic olecranon bursitis. The left elbow is warm tender and painful but he is able to fully move the elbow without signs of septic joint. Fluid was removed and cultured. Patient was placed on Bactrim.  9:58 PM Patient feels better after fluids and we'll  discharge home.        Gwyneth Sprout, MD 04/21/12 1610  Gwyneth Sprout, MD 04/26/12 [phone]

## 2012-04-21 NOTE — ED Notes (Signed)
[id number]<ZH> Expected date:04/21/12<BR> Expected time:<BR> Means of arrival:<BR> Comments:<BR> EMS 15 male cancer pt

## 2012-04-22 NOTE — ED Notes (Signed)
Prescription e-scribed to [facility] cancelled and called in to [facility] on [address] at [phone] (septra ds as per d/c instructions); pt states pharmacy at [facility] closed all weekend.

## 2012-04-24 LAB — WOUND CULTURE

## 2012-04-25 ENCOUNTER — Encounter (HOSPITAL_COMMUNITY): Payer: Self-pay | Admitting: Emergency Medicine

## 2012-04-25 ENCOUNTER — Emergency Department (HOSPITAL_COMMUNITY)
Admission: EM | Admit: 2012-04-25 | Discharge: 2012-04-25 | Disposition: A | Discharge status: Home / Self Care | Payer: BC Managed Care – PPO | Attending: Emergency Medicine | Admitting: Emergency Medicine

## 2012-04-25 ENCOUNTER — Emergency Department (HOSPITAL_COMMUNITY): Payer: BC Managed Care – PPO

## 2012-04-25 DIAGNOSIS — L02419 Cutaneous abscess of limb, unspecified: Secondary | ICD-10-CM

## 2012-04-25 DIAGNOSIS — Z79899 Other long term (current) drug therapy: Secondary | ICD-10-CM | POA: Insufficient documentation

## 2012-04-25 DIAGNOSIS — C9000 Multiple myeloma not having achieved remission: Secondary | ICD-10-CM | POA: Insufficient documentation

## 2012-04-25 DIAGNOSIS — IMO0002 Reserved for concepts with insufficient information to code with codable children: Secondary | ICD-10-CM | POA: Insufficient documentation

## 2012-04-25 DIAGNOSIS — L03119 Cellulitis of unspecified part of limb: Secondary | ICD-10-CM

## 2012-04-25 DIAGNOSIS — Z880 Allergy status to penicillin: Secondary | ICD-10-CM | POA: Insufficient documentation

## 2012-04-25 DIAGNOSIS — Z87891 Personal history of nicotine dependence: Secondary | ICD-10-CM | POA: Insufficient documentation

## 2012-04-25 HISTORY — DX: Multiple myeloma not having achieved remission: C90.00

## 2012-04-25 LAB — BASIC METABOLIC PANEL
BUN: 30 mg/dL — ABNORMAL HIGH (ref 6–23)
CO2: 26 mEq/L (ref 19–32)
Calcium: 8.4 mg/dL (ref 8.4–10.5)
Chloride: 97 mEq/L (ref 96–112)
Creatinine, Ser: 0.56 mg/dL (ref 0.50–1.35)
GFR calc Af Amer: >90 mL/min (ref >90)
GFR calc non Af Amer: >90 mL/min (ref >90)
Glucose, Bld: 245 mg/dL — ABNORMAL HIGH (ref 70–99)
Potassium: 5.1 mEq/L (ref 3.5–5.1)
Sodium: 133 mEq/L — ABNORMAL LOW (ref 135–145)

## 2012-04-25 LAB — CBC WITH DIFFERENTIAL/PLATELET
Basophils Absolute: 0 10*3/uL (ref 0.0–0.1)
Basophils Relative: 0 % (ref 0–1)
Eosinophils Absolute: 0 10*3/uL (ref 0.0–0.7)
Eosinophils Relative: 0 % (ref 0–5)
HCT: 39.4 % (ref 39.0–52.0)
Hemoglobin: 13.9 g/dL (ref 13.0–17.0)
Lymphocytes Relative: 3 % — ABNORMAL LOW (ref 12–46)
Lymphs Abs: 0.2 10*3/uL — ABNORMAL LOW (ref 0.7–4.0)
MCH: 32.4 pg (ref 26.0–34.0)
MCHC: 35.3 g/dL (ref 30.0–36.0)
MCV: 91.8 fL (ref 78.0–100.0)
Monocytes Absolute: 0.2 10*3/uL (ref 0.1–1.0)
Monocytes Relative: 3 % (ref 3–12)
Neutro Abs: 7.3 10*3/uL (ref 1.7–7.7)
Neutrophils Relative %: 94 % — ABNORMAL HIGH (ref 43–77)
Platelets: DECREASED 10*3/uL (ref 150–400)
RBC: 4.29 MIL/uL (ref 4.22–5.81)
RDW: 14 % (ref 11.5–15.5)
WBC: 7.7 10*3/uL (ref 4.0–10.5)

## 2012-04-25 MED ORDER — CLINDAMYCIN PHOSPHATE 900 MG/50ML IV SOLN
900.0000 mg | Freq: Once | INTRAVENOUS | Status: AC
  Administered 2012-04-25: 900 mg via INTRAVENOUS
  Filled 2012-04-25: qty 50

## 2012-04-25 MED ORDER — LIDOCAINE-EPINEPHRINE (PF) 1 %-1:200000 IJ SOLN
INTRAMUSCULAR | Status: AC
  Filled 2012-04-25: qty 10

## 2012-04-25 NOTE — ED Notes (Signed)
Ortho tech at bedside for application of arm sling.  

## 2012-04-25 NOTE — ED Notes (Signed)
MD at bedside. Dr. Ghim at bedside.  

## 2012-04-25 NOTE — ED Notes (Signed)
Per report from [employer]  Pt is awaiting ride home by [facility] ,  They have been called and this pt is 3rd in line,  Pt is resting quietly at this time,  NAD,  Pt has been given discharge instructions by prior nurse Chere RN

## 2012-04-25 NOTE — ED Provider Notes (Signed)
History     CSN: [id number]  Arrival date & time 04/25/12  1158   First MD Initiated Contact with Patient 04/25/12 1327      Chief Complaint  Patient presents with  . Abscess    left elbow    (Consider location/radiation/quality/duration/timing/severity/associated sxs/prior treatment) HPI Patient presents emergency department with left elbow swelling and redness.  Patient, states he seen here one week ago, and they aspirated 5 cc of purulent material.  Patient, states he was placed on Bactrim, but states the area got worse over the last 2-3 day.  Patient, states, to move his elbow, but it is painful.  Patient denies nausea, vomiting, fever, weakness, headache, blurred vision, chest pain, or shortness of breath.  Past Medical History  Diagnosis Date  . Arthritis   . GERD (gastroesophageal reflux disease)   . Complication of anesthesia     aspiration during surgery, difficult to wake up  . Multiple myeloma     Past Surgical History  Procedure Date  . Fracture surgery     ankle  . Hemorrhoid surgery   . Back surgery     47 years ago    Family History  Problem Relation Age of Onset  . Cancer Maternal Grandfather   . Cancer Maternal Grandmother     History  Substance Use Topics  . Smoking status: Former Smoker    Types: Cigarettes    Quit date: 03/09/1978  . Smokeless tobacco: Never Used  . Alcohol Use: Yes     1 glass of wine or a burbon daily.      Review of Systems All other systems negative except as documented in the HPI. All pertinent positives and negatives as reviewed in the HPI.  Allergies  Penicillins  Home Medications   Current Outpatient Rx  Name Route Sig Dispense Refill  . AMLODIPINE BESYLATE 5 MG PO TABS Oral Take 5 mg by mouth daily.    Marland Kitchen BACLOFEN 5 MG HALF TABLET Oral Take 5 mg by mouth 3 (three) times daily.    Marland Kitchen DEXAMETHASONE 4 MG PO TABS Oral Take 6 mg by mouth Twice daily.     Marland Kitchen FAMCICLOVIR 500 MG PO TABS Oral Take 500 mg by mouth  Daily.     Marland Kitchen ONDANSETRON HCL 4 MG PO TABS Oral Take 4 mg by mouth every 8 (eight) hours as needed. Nausea    . OXYCODONE HCL 5 MG PO TABS Oral Take 5 mg by mouth every 4 (four) hours as needed. Breakthrough pain    . OXYCODONE HCL ER 20 MG PO TB12 Oral Take 20 mg by mouth every 12 (twelve) hours.    Marland Kitchen POLYETHYLENE GLYCOL 3350 PO PACK Oral Take 17 g by mouth once.    . SULFAMETHOXAZOLE-TRIMETHOPRIM 800-160 MG PO TABS Oral Take 1 tablet by mouth every 12 (twelve) hours. 14 tablet 0  . ZOLPIDEM TARTRATE 5 MG PO TABS Oral Take 5 mg by mouth At bedtime as needed. sleep      BP 143/78  Pulse 83  Temp 97.5 F (36.4 C) (Oral)  Resp 16  SpO2 95%  Physical Exam  Nursing note and vitals reviewed. Constitutional: He appears well-developed and well-nourished.  HENT:  Head: Normocephalic and atraumatic.  Mouth/Throat: Oropharynx is clear and moist.  Cardiovascular: Normal rate, regular rhythm and normal heart sounds.  Exam reveals no gallop and no friction rub.   No murmur heard. Pulmonary/Chest: Effort normal and breath sounds normal.  Musculoskeletal:  Arms: Skin: Skin is warm and dry. There is erythema.    ED Course  Procedures (including critical care time)  Labs Reviewed  CBC WITH DIFFERENTIAL - Abnormal; Notable for the following:    Neutrophils Relative 94 (*)     Lymphocytes Relative 3 (*)     Lymphs Abs 0.2 (*)     All other components within normal limits  BASIC METABOLIC PANEL - Abnormal; Notable for the following:    Sodium 133 (*)     Glucose, Bld 245 (*)     BUN 30 (*)     All other components within normal limits   Dg Elbow Complete Left  04/25/2012  *RADIOLOGY REPORT*  Clinical Data: 71 year old male pain and swelling.  Abscess drained 5 days ago. History multiple myeloma.  LEFT ELBOW - COMPLETE 3+ VIEW  Comparison: None.  Findings: No joint effusion identified.  Bone mineralization about the elbow is within normal limits.  No destructive or lytic lesion  identified.  Degenerative spurring at the olecranon.  Joint spaces preserved.  No fracture or dislocation.  No subcutaneous gas. No radiopaque foreign body identified.  IMPRESSION: No acute or lytic osseous abnormality identified about the left elbow.   Original Report Authenticated By: [name], M.D.    spoke with Dr. Amanda Pea  from  hand surgery.  He came in and evaluated the patient and drained the abscess.  He placed a drain in the wound and will see the patient in his office tomorrow morning.    MDM  MDM Reviewed: vitals, nursing note and previous chart Interpretation: labs Consults: orthopedics            Carlyle Dolly, PA-C 04/25/12 1833

## 2012-04-25 NOTE — ED Notes (Signed)
[id number]> Expected date:<BR> Expected time:<BR> Means of arrival:<BR> Comments:<BR> 71yoM- abcess to elbow; bed bound

## 2012-04-25 NOTE — ED Notes (Signed)
Patient's s/s of infection began one week ago- patient's left elbow is red/swollen/warm to touch.  Patient is undergoing treatment with PO ABX (unknown).  Patient states his elbow started swelling and hurting again last night.  Patient denies fevers.

## 2012-04-25 NOTE — ED Notes (Signed)
Patient transported to X-ray 

## 2012-04-25 NOTE — ED Notes (Signed)
Hand surgeon at bedside for I&D of L elbow.

## 2012-04-25 NOTE — ED Notes (Signed)
+   Septra-I/D done. Chart appended per protocol MD.

## 2012-04-25 NOTE — Consult Note (Signed)
  Please see full consult DICTATION #[id number] Dominica Severin MD

## 2012-04-25 NOTE — ED Notes (Signed)
[facility] called to take pt to his residence.

## 2012-04-26 NOTE — Consult Note (Signed)
NAMEMarland Kitchen  BOHANNON, COMPHER NO.:  [id number]  MEDICAL RECORD NO.:  [id number]  LOCATION:  WOTF                         FACILITY:  [facility]  PHYSICIAN:  Dionne Ano. Ellajane Stong, M.D.DATE OF BIRTH:  1941/01/07  DATE OF CONSULTATION: DATE OF DISCHARGE:  04/25/2012                                CONSULTATION   I had the pleasure to see Robert Berger in the [facility] Emergency Room.  I have reviewed his notes and findings.  It appears that he was seen by emergency room staff a week ago, had an aspiration of his left elbow performed.  It ultimately grew out Staph aureus sensitive to oxacillin.  Since that time, he has taken Bactrim and continues to worsen.  He has swelling in his left elbow about the bursa region.  This is worsening and produced a return visit to the emergency room.  I have discussed these issues with him at length and the findings.  It appears that Dr. Anitra Lauth saw the patient and presided over an aspiration with 23-gauge needle.  The patient had purulent discharge. He was discharged home.  I do not know what discharge instructions were given at that time.  Today he presents with worsening characteristics.  I have discussed with him the findings.  Robert Berger asked me to see and treat him today.  The patient and myself have discussed to the upper extremity predicament in detail.  Unfortunately he has history since July of multiple myeloma. He is undergoing chemotherapy which has left his body very weak and he is unable to walk even with assistance.  He is having quite a time with this.  At this juncture, the patient has noted pain and worsening.  He has been on Bactrim/Septra DS b.i.d. per his discharge instructions at last visit.  PAST MEDICAL AND SURGICAL HISTORY:  Updated in detail.  He has a history of arthritis, multiple myeloma, gastroesophageal reflux disease.  ALLERGIES:  PENICILLIN.  SOCIAL HISTORY:  He is a former smoker.  He does not use  smokeless tobacco.  He typically consumes a glass of wine or bourbon daily.  He does not take any illicit drugs.  He is divorced twice, lives with a male partner.  The patient and I have discussed these issues at length.  PAST SURGICAL HISTORY:  Ankle fracture, hemorrhoid, and back surgery. He also notes a kyphoplasty recently it appears.  He is poor on the details of these issues.  PHYSICAL EXAMINATION:  GENERAL:  He is bedridden.  He has ability to move his arms and legs but states he cannot walk. ABDOMEN:  Protuberant, nontender, nondistended. CHEST:  Clear. HEENT:  Within normal limits grossly. EXTREMITIES:  He has no evidence of right upper extremity abnormality or infection.  Left upper extremity has a swollen painful olecranon bursa with fluctuance.  There is erythema and cellulitis surrounding this area.  I have reviewed these findings at great length.  IMPRESSION:  Infected olecranon bursa times greater than a week.  PLAN:  I have discussed this patient's findings given the fact that he had an aspiration which grew out bacteria a week ago and has had continuing worsening.  I would certainly recommend a formal  I and D.  He has bacterial sepsis in his bursa and this needs a formal irrigation and debridement.  He was consented for this.  PROCEDURE NOTE:  The patient was taken to the procedure suite, underwent a lidocaine field block.  Following this, he had a 2 less than 1 cm incisions made proximal and distal to the bursa.  I dissected down and decompressed the abscess followed by a bursectomy in regards to portions of the bursa.  A large amount of purulent fluid was removed.  Following removal of the purulent fluid, I then placed greater than a liter of saline through and through both holes and placed a Penrose drain.  Thus vast I and D of the deep abscess about the olecranon bursa and bursectomy was accomplished without difficulty.  The patient was given  clindamycin in the ER IV.  We are going to continue his Septra twice a day.  I am going to see him tomorrow in my office at 11:30.  I have discussed with the patient these issues at length of these notes, etc.  He understands the followup care.  I have given him my cellular phone, discussed him that he absolutely needs to be cognizant of the seriousness of the injury and the fact that we need to do everything in our power to try to get rid of infection.  In short, he is a chemotherapy patient who has multiple myeloma.  His defense system is down in our estimation and we need to absolutely work very hard to try and restore him to a quiescent state of affairs. Surgical I and D, antibiotics and other measures are the rule.  These notes have been discussed.  We will see him back in the office tomorrow at 11:30 and should any problems arise, he will notify.     Dionne Ano. Amanda Pea, M.D.     [facility]  D:  04/25/2012  T:  04/26/2012  Job:  [id number]  cc:   [name], M.D. Fax: [phone]  Carlyle Dolly, PA  Gwyneth Sprout, MD

## 2012-04-27 ENCOUNTER — Ambulatory Visit: Payer: BC Managed Care – PPO

## 2012-04-27 ENCOUNTER — Telehealth: Payer: Self-pay | Admitting: Hematology & Oncology

## 2012-04-27 ENCOUNTER — Other Ambulatory Visit: Payer: BC Managed Care – PPO | Admitting: Lab

## 2012-04-27 NOTE — Telephone Encounter (Signed)
I spoke to Robert Berger on the phone today. I actually saw him in the waiting area today. He does had surgery on his left elbow because of an abscess in the bursa . I do not feel that he needed to have any chemotherapy because of his recent elbow infection and and that he is responding nicely to treatment so far. Apparently status is very weak. This could be from the chemotherapy. He also had radiation.  He is also complaining more of his back. However, he says that he just is weaker and not able to do what he would like. He is getting physical therapy but they are not coming out right now. I told him to give him a call to see if he cannot come out.  I also told to start him back on the Decadron. He is on 6 mg twice a day. I told to go to 4 mg twice a day for 4 days and then go to 4 mg a day until we see him back.  I think that a break from chemotherapy will certainly good doing well. We did get his quality of life better.  this he understands.  We will plan to get him back in early  October. We will check his labs and myeloma studies. We certainly might be oh to hold on any further chemotherapy. Again, this is about quality-of-life. Robert Berger agrees with this.  I spent a good 20 minutes on the phone with him. Hopefully, we see him next he'll be able to walk in.  Cindee Lame

## 2012-04-27 NOTE — Progress Notes (Unsigned)
Pt came in today on stretcher.  Dr. Myna Hidalgo spoke with pt in lobby.  Pt states he had his elbow drained again the the OR this time.  Per Dr. Myna Hidalgo, tx will be held until Oct 2.  Pt aware.

## 2012-04-27 NOTE — ED Provider Notes (Signed)
Medical screening examination/treatment/procedure(s) were conducted as a shared visit with non-physician practitioner(s) and myself.  I personally evaluated the patient during the encounter  Pt with recurring swelling, redness to left elbow, was aspirated and 5 cc of purulent material drained last week.  No systemic fevers, has gotten more swollen.  Able to bend, no numbness or weakness.  Small area of fluctuance at olecranon, surrounding erythema suggestive of cellulitis and some abscess formation.  Needs ortho referral for likely more extensive I&D.    Gavin Pound. Oletta Lamas, MD 04/27/12 2134

## 2012-04-28 LAB — WOUND CULTURE: Special Requests: NORMAL

## 2012-04-29 NOTE — ED Notes (Signed)
+  Wound. Patient treated with Septra DS. Sensitive to same. Per protocol MD. °

## 2012-04-30 LAB — ANAEROBIC CULTURE

## 2012-05-01 ENCOUNTER — Ambulatory Visit: Payer: Medicare Other | Admitting: Internal Medicine

## 2012-05-02 ENCOUNTER — Encounter: Payer: Self-pay | Admitting: *Deleted

## 2012-05-02 NOTE — Progress Notes (Signed)
Pt called asking if we could give his physical therapist pyrometers for his blood pressure and heart rate so they could continue his physical therapy.  Dr. Gustavo Lah parameters faxed to Debria Garret at [facility].  Spoke with [place] and she states pt will not do even the simplest bed exercises on his own. States she will go out to visit once more and see how the patient does.

## 2012-05-02 NOTE — Progress Notes (Unsigned)
Pt called asking for Dr. Myna Hidalgo to give the Physical Therapist parameter for his heart rate and blood pressure.  Per Dr. Myna Hidalgo, PT

## 2012-05-03 ENCOUNTER — Ambulatory Visit: Payer: BC Managed Care – PPO | Admitting: Internal Medicine

## 2012-05-05 ENCOUNTER — Ambulatory Visit: Payer: Medicare Other | Admitting: Radiation Oncology

## 2012-05-06 ENCOUNTER — Emergency Department (HOSPITAL_COMMUNITY)
Admission: EM | Admit: 2012-05-06 | Discharge: 2012-05-06 | Disposition: A | Discharge status: Home / Self Care | Payer: BC Managed Care – PPO | Attending: Emergency Medicine | Admitting: Emergency Medicine

## 2012-05-06 ENCOUNTER — Encounter (HOSPITAL_COMMUNITY): Payer: Self-pay | Admitting: *Deleted

## 2012-05-06 DIAGNOSIS — Z79899 Other long term (current) drug therapy: Secondary | ICD-10-CM | POA: Insufficient documentation

## 2012-05-06 DIAGNOSIS — R1310 Dysphagia, unspecified: Secondary | ICD-10-CM

## 2012-05-06 DIAGNOSIS — R531 Weakness: Secondary | ICD-10-CM

## 2012-05-06 DIAGNOSIS — Z880 Allergy status to penicillin: Secondary | ICD-10-CM | POA: Insufficient documentation

## 2012-05-06 DIAGNOSIS — R29898 Other symptoms and signs involving the musculoskeletal system: Secondary | ICD-10-CM

## 2012-05-06 DIAGNOSIS — C9000 Multiple myeloma not having achieved remission: Secondary | ICD-10-CM | POA: Insufficient documentation

## 2012-05-06 DIAGNOSIS — K219 Gastro-esophageal reflux disease without esophagitis: Secondary | ICD-10-CM | POA: Insufficient documentation

## 2012-05-06 DIAGNOSIS — M129 Arthropathy, unspecified: Secondary | ICD-10-CM | POA: Insufficient documentation

## 2012-05-06 DIAGNOSIS — Z87891 Personal history of nicotine dependence: Secondary | ICD-10-CM | POA: Insufficient documentation

## 2012-05-06 DIAGNOSIS — R5381 Other malaise: Secondary | ICD-10-CM | POA: Insufficient documentation

## 2012-05-06 DIAGNOSIS — R5383 Other fatigue: Secondary | ICD-10-CM | POA: Insufficient documentation

## 2012-05-06 DIAGNOSIS — B370 Candidal stomatitis: Secondary | ICD-10-CM | POA: Insufficient documentation

## 2012-05-06 HISTORY — DX: Dysphagia, unspecified: R13.10

## 2012-05-06 LAB — CBC WITH DIFFERENTIAL/PLATELET
Basophils Relative: 1 % (ref 0–1)
Eosinophils Absolute: 0 10*3/uL (ref 0.0–0.7)
Eosinophils Relative: 0 % (ref 0–5)
HCT: 32.3 % — ABNORMAL LOW (ref 39.0–52.0)
Hemoglobin: 11.2 g/dL — ABNORMAL LOW (ref 13.0–17.0)
Lymphocytes Relative: 18 % (ref 12–46)
MCH: 31.9 pg (ref 26.0–34.0)
MCHC: 34.7 g/dL (ref 30.0–36.0)
Neutro Abs: 2.2 10*3/uL (ref 1.7–7.7)
Neutrophils Relative %: 74 % (ref 43–77)
RBC: 3.51 MIL/uL — ABNORMAL LOW (ref 4.22–5.81)

## 2012-05-06 LAB — COMPREHENSIVE METABOLIC PANEL
ALT: 111 U/L — ABNORMAL HIGH (ref 0–53)
AST: 43 U/L — ABNORMAL HIGH (ref 0–37)
Albumin: 1.9 g/dL — ABNORMAL LOW (ref 3.5–5.2)
Alkaline Phosphatase: 100 U/L (ref 39–117)
CO2: 22 mEq/L (ref 19–32)
Chloride: 101 mEq/L (ref 96–112)
GFR calc non Af Amer: >90 mL/min (ref >90)
Potassium: 5 mEq/L (ref 3.5–5.1)
Sodium: 133 mEq/L — ABNORMAL LOW (ref 135–145)
Total Bilirubin: 0.7 mg/dL (ref 0.3–1.2)

## 2012-05-06 LAB — URINALYSIS, ROUTINE W REFLEX MICROSCOPIC
Bilirubin Urine: NEGATIVE
Glucose, UA: 500 mg/dL — AB
Hgb urine dipstick: NEGATIVE
Ketones, ur: NEGATIVE mg/dL
Protein, ur: NEGATIVE mg/dL
Urobilinogen, UA: 1 mg/dL (ref 0.0–1.0)

## 2012-05-06 MED ORDER — SODIUM CHLORIDE 0.9 % IV BOLUS (SEPSIS)
500.0000 mL | Freq: Once | INTRAVENOUS | Status: AC
  Administered 2012-05-06: 500 mL via INTRAVENOUS

## 2012-05-06 MED ORDER — BACITRACIN ZINC 500 UNIT/GM EX OINT
TOPICAL_OINTMENT | CUTANEOUS | Status: AC
  Administered 2012-05-06: 1
  Filled 2012-05-06: qty 0.9

## 2012-05-06 MED ORDER — FLUCONAZOLE 150 MG PO TABS
150.0000 mg | ORAL_TABLET | Freq: Once | ORAL | Status: AC
  Administered 2012-05-06: 150 mg via ORAL
  Filled 2012-05-06: qty 1

## 2012-05-06 MED ORDER — OXYCODONE-ACETAMINOPHEN 5-325 MG PO TABS
2.0000 | ORAL_TABLET | Freq: Once | ORAL | Status: AC
  Administered 2012-05-06: 2 via ORAL
  Filled 2012-05-06: qty 2

## 2012-05-06 MED ORDER — FLUCONAZOLE 200 MG PO TABS: 200.0000 mg | ORAL_TABLET | Freq: Every day | ORAL | Status: DC

## 2012-05-06 NOTE — ED Notes (Signed)
Patient is alert and oriented x3.  She states that he has an increase in urination Over the last few days.  Per EMS his blood sugar was 214.

## 2012-05-06 NOTE — ED Provider Notes (Signed)
History     CSN: [id number]  Arrival date & time 05/06/12  9147   First MD Initiated Contact with Patient 05/06/12 [phone]      Chief Complaint  Patient presents with  . Dysphagia    (Consider location/radiation/quality/duration/timing/severity/associated sxs/prior treatment) The history is provided by the patient.  pt w hx multiple myeloma c/o  Soreness w swallowing for past few days, feeling as if mouth/throat is dry, states does not feel like throat infection. No cough or other uri symptoms. No fever or chills. States feels as if urinating more frequently than normal. No dysuria. Is eating and drinking, but states less than normal. w multiple myeloma, denies prior transfusion, notes last chemo 2 weeks ago. Denies abd pain. No nvd. No cp or sob. No headache. Generally weak, no focal numbness or weakness. States compliant w meds, no recent change in meds. Takes decadron, states checked bs and was 214, states normally his blood sugars are in the 100s.     Past Medical History  Diagnosis Date  . Arthritis   . GERD (gastroesophageal reflux disease)   . Complication of anesthesia     aspiration during surgery, difficult to wake up  . Multiple myeloma     Past Surgical History  Procedure Date  . Fracture surgery     ankle  . Hemorrhoid surgery   . Back surgery     47 years ago    Family History  Problem Relation Age of Onset  . Cancer Maternal Grandfather   . Cancer Maternal Grandmother     History  Substance Use Topics  . Smoking status: Former Smoker    Types: Cigarettes    Quit date: 03/09/1978  . Smokeless tobacco: Never Used  . Alcohol Use: Yes     1 glass of wine or a burbon daily.      Review of Systems  Constitutional: Negative for fever.  HENT: Negative for neck pain and neck stiffness.   Eyes: Negative for visual disturbance.  Respiratory: Negative for cough and shortness of breath.   Cardiovascular: Negative for chest pain.  Gastrointestinal: Negative  for abdominal pain.  Genitourinary: Negative for flank pain.  Musculoskeletal: Negative for back pain.  Skin: Negative for rash.  Neurological: Negative for headaches.  Hematological: Does not bruise/bleed easily.  Psychiatric/Behavioral: Negative for confusion.    Allergies  Penicillins  Home Medications   Current Outpatient Rx  Name Route Sig Dispense Refill  . AMLODIPINE BESYLATE 5 MG PO TABS Oral Take 5 mg by mouth daily.    Marland Kitchen BACLOFEN 5 MG HALF TABLET Oral Take 5 mg by mouth 3 (three) times daily.    Marland Kitchen DEXAMETHASONE 4 MG PO TABS Oral Take 6 mg by mouth Twice daily.     Marland Kitchen FAMCICLOVIR 500 MG PO TABS Oral Take 500 mg by mouth Daily.     Marland Kitchen ONDANSETRON HCL 4 MG PO TABS Oral Take 4 mg by mouth every 8 (eight) hours as needed. Nausea    . OXYCODONE HCL 5 MG PO TABS Oral Take 5 mg by mouth every 4 (four) hours as needed. Breakthrough pain    . OXYCODONE HCL ER 20 MG PO TB12 Oral Take 20 mg by mouth every 12 (twelve) hours.    Marland Kitchen POLYETHYLENE GLYCOL 3350 PO PACK Oral Take 17 g by mouth once.    Marland Kitchen ZOLPIDEM TARTRATE 5 MG PO TABS Oral Take 5 mg by mouth At bedtime as needed. sleep      BP 116/73  Pulse 98  Temp 99.2 F (37.3 C) (Oral)  Resp 18  SpO2 95%  Physical Exam  Nursing note and vitals reviewed. Constitutional: He is oriented to person, place, and time. He appears well-developed and well-nourished. No distress.  HENT:  Head: Atraumatic.       Oral thrush, pharynx otherwise normal.   Eyes: Pupils are equal, round, and reactive to light.  Neck: Normal range of motion. Neck supple. No tracheal deviation present.       No stiffness or rigidity  Cardiovascular: Normal rate, regular rhythm, normal heart sounds and intact distal pulses.   Pulmonary/Chest: Effort normal and breath sounds normal. No accessory muscle usage. No respiratory distress.  Abdominal: Soft. Bowel sounds are normal. He exhibits no distension and no mass. There is no tenderness. There is no rebound and no  guarding.  Genitourinary:       No cva tenderness. Light brown stool, heme neg  Musculoskeletal: Normal range of motion. He exhibits no edema and no tenderness.       Healing wound left elbow, no purulent drainage, no cellulitis.   Lymphadenopathy:    He has no cervical adenopathy.  Neurological: He is alert and oriented to person, place, and time.       Motor intact bil.   Skin: Skin is warm and dry.  Psychiatric: He has a normal mood and affect.    ED Course  Procedures (including critical care time)   Results for orders placed during the hospital encounter of 05/06/12  CBC WITH DIFFERENTIAL      Component Value Range   WBC 2.9 (*) 4.0 - 10.5 K/uL   RBC 3.51 (*) 4.22 - 5.81 MIL/uL   Hemoglobin 11.2 (*) 13.0 - 17.0 g/dL   HCT 16.1 (*) 09.6 - 04.5 %   MCV 92.0  78.0 - 100.0 fL   MCH 31.9  26.0 - 34.0 pg   MCHC 34.7  30.0 - 36.0 g/dL   RDW 40.9  81.1 - 91.4 %   Platelets 62 (*) 150 - 400 K/uL   Neutrophils Relative 74  43 - 77 %   Lymphocytes Relative 18  12 - 46 %   Monocytes Relative 7  3 - 12 %   Eosinophils Relative 0  0 - 5 %   Basophils Relative 1  0 - 1 %   Neutro Abs 2.2  1.7 - 7.7 K/uL   Lymphs Abs 0.5 (*) 0.7 - 4.0 K/uL   Monocytes Absolute 0.2  0.1 - 1.0 K/uL   Eosinophils Absolute 0.0  0.0 - 0.7 K/uL   Basophils Absolute 0.0  0.0 - 0.1 K/uL   RBC Morphology RARE NRBCs     WBC Morphology ATYPICAL LYMPHOCYTES     Smear Review LARGE PLATELETS PRESENT    COMPREHENSIVE METABOLIC PANEL      Component Value Range   Sodium 133 (*) 135 - 145 mEq/L   Potassium 5.0  3.5 - 5.1 mEq/L   Chloride 101  96 - 112 mEq/L   CO2 22  19 - 32 mEq/L   Glucose, Bld 187 (*) 70 - 99 mg/dL   BUN 13  6 - 23 mg/dL   Creatinine, Ser 7.82 (*) 0.50 - 1.35 mg/dL   Calcium 7.9 (*) 8.4 - 10.5 mg/dL   Total Protein 5.0 (*) 6.0 - 8.3 g/dL   Albumin 1.9 (*) 3.5 - 5.2 g/dL   AST 43 (*) 0 - 37 U/L   ALT 111 (*) 0 - 53  U/L   Alkaline Phosphatase 100  39 - 117 U/L   Total Bilirubin 0.7  0.3 -  1.2 mg/dL   GFR calc non Af Amer >90  >90 mL/min   GFR calc Af Amer >90  >90 mL/min  URINALYSIS, ROUTINE W REFLEX MICROSCOPIC      Component Value Range   Color, Urine YELLOW  YELLOW   APPearance CLEAR  CLEAR   Specific Gravity, Urine 1.027  1.005 - 1.030   pH 6.5  5.0 - 8.0   Glucose, UA 500 (*) NEGATIVE mg/dL   Hgb urine dipstick NEGATIVE  NEGATIVE   Bilirubin Urine NEGATIVE  NEGATIVE   Ketones, ur NEGATIVE  NEGATIVE mg/dL   Protein, ur NEGATIVE  NEGATIVE mg/dL   Urobilinogen, UA 1.0  0.0 - 1.0 mg/dL   Nitrite NEGATIVE  NEGATIVE   Leukocytes, UA NEGATIVE  NEGATIVE    MDM  Iv ns bolus. Labs.   Reviewed nursing notes and prior charts for additional history.    Iv ns in ed. Po fluids. Fluconazole for thrush.   Pt notes generally weak for 2-3 months during which time he has been bed bound, has home health, home PT. Discussed w social work re maximize home health, home PT, deconditioning.   Discussed pt, symptoms, labs w his on call md.   Discussed w heme/onc md on call for Dr Myna Hidalgo. He will leave message w Dr Myna Hidalgo Monday to facilitate close follow up of pt.        Suzi Roots, MD 05/06/12 [phone]

## 2012-05-06 NOTE — ED Notes (Signed)
[facility] called to check on ETA for patient transport.

## 2012-05-06 NOTE — ED Notes (Signed)
Patient is alert and oriented x3.  He is complaining of dry mouth with dysphagia. He denies any pain.

## 2012-05-06 NOTE — ED Notes (Signed)
Patient still unable to give urine sample at this time.  

## 2012-05-06 NOTE — ED Notes (Addendum)
MD contacted CSW about pt in regards to initiating home health services for pt. CSW contacted CM at [phone] to inform CM of request. CM will contact MD to follow up.  Janann Colonel., MSW, [facility] Clinical Social Worker [phone]

## 2012-05-08 ENCOUNTER — Ambulatory Visit: Payer: BC Managed Care – PPO | Admitting: Internal Medicine

## 2012-05-08 ENCOUNTER — Other Ambulatory Visit: Payer: Self-pay | Admitting: *Deleted

## 2012-05-08 DIAGNOSIS — C9000 Multiple myeloma not having achieved remission: Secondary | ICD-10-CM

## 2012-05-08 MED ORDER — OXYCODONE HCL 20 MG PO TB12: 20.0000 mg | ORAL_TABLET | Freq: Two times a day (BID) | ORAL | Status: DC

## 2012-05-08 MED ORDER — AMLODIPINE BESYLATE 5 MG PO TABS: 5.0000 mg | ORAL_TABLET | Freq: Every day | ORAL | Status: DC

## 2012-05-08 MED ORDER — BACLOFEN 5 MG HALF TABLET: 5.0000 mg | ORAL_TABLET | Freq: Three times a day (TID) | ORAL | Status: DC

## 2012-05-08 NOTE — Care Management Note (Signed)
    Page 1 of 1   05/06/2012     4:03:40 PM   CARE MANAGEMENT NOTE 05/06/2012  Patient:  Robert Berger, Robert Berger   Account Number:  [id number]  Date Initiated:  05/06/2012  Documentation initiated by:  Konrad Felix  Subjective/Objective Assessment:   Patient seen in the ED with complaints of dry mouth and frequent urination.     Action/Plan:   Discharge to home when medically stable.   Anticipated DC Date:  05/06/2012   Anticipated DC Plan:  HOME W HOME HEALTH SERVICES      DC Planning Services  CM consult      [facility] Choice  HOME HEALTH   Choice offered to / List presented to:  C-1 Patient           [facility] agency  [facility]   Status of service:  Completed, signed off Medicare Important Message given?   (If response is "NO", the following Medicare IM given date fields will be blank) Date Medicare IM given:   Date Additional Medicare IM given:    Discharge Disposition:  HOME W HOME HEALTH SERVICES  Per UR Regulation:  Reviewed for med. necessity/level of care/duration of stay  If discussed at Long Length of Stay Meetings, dates discussed:    Comments:  05/06/2012  Konrad Felix RN, case manager   [phone] Consulted to provide increased home health services. Physician requested that I ensure services are being maximized. I have contacted [place] and requested an updated evaluation be performed and bring in social work to the home. I have also sent a consult to social work, particularly the cancer center SW. I also suggested that patient contact Dr. Myna Hidalgo first thing Monday morning since he had to cancel his previous appointment.

## 2012-05-09 ENCOUNTER — Ambulatory Visit
Admission: RE | Admit: 2012-05-09 | Discharge: 2012-05-09 | Disposition: A | Discharge status: Home / Self Care | Payer: BC Managed Care – PPO | Source: Ambulatory Visit | Attending: Radiation Oncology | Admitting: Radiation Oncology

## 2012-05-09 ENCOUNTER — Encounter: Payer: Self-pay | Admitting: Radiation Oncology

## 2012-05-09 VITALS — BP 115/77 | HR 108 | Temp 98.9°F | Resp 20

## 2012-05-09 DIAGNOSIS — C9000 Multiple myeloma not having achieved remission: Secondary | ICD-10-CM

## 2012-05-09 HISTORY — DX: Personal history of irradiation: Z92.3

## 2012-05-09 HISTORY — DX: Dysphagia, unspecified: R13.10

## 2012-05-09 NOTE — Progress Notes (Signed)
Radiation Oncology         [phone] ________________________________  Name: Robert Berger MRN: [id number]  Date: 05/09/2012  DOB: 1940-10-15  Follow-Up Visit Note  CC: [name], MD  [name], MD  Diagnosis:   Multiple myeloma  Interval Since Last Radiation: The patient completed 20 Gray in 10 fractions from T8-L4 on 04/05/2012  Narrative:  The patient returns today for routine follow-up.  He is accompanied by EMTs. His taken here by ambulance. He is supine on a gurney. He is alert and oriented. He reports that he's been receiving chemotherapy and Dr. Gustavo Lah office. He follows up with him later this week. He reports that he is diffusely weak and he has a physical therapist visit him at home. He also has a home health nurse and his significant other helping him out. He reports that he has to lie down for most the day because when he sits up he has fairly intense pain in his lower back, the lumbar region. He reports that his pain is better than it had been before radiotherapy. He reports some stiffness and pain in the anterior portions of his feet and legs that comes and goes. No new sites of pain.                  ALLERGIES:  is allergic to penicillins.  Meds: Current Outpatient Prescriptions  Medication Sig Dispense Refill  . amLODipine (NORVASC) 5 MG tablet Take 1 tablet (5 mg total) by mouth daily.  30 tablet  1  . baclofen (LIORESAL) 5 mg TABS Take 0.5 tablets (5 mg total) by mouth 3 (three) times daily.  90 tablet  0  . dexamethasone (DECADRON) 4 MG tablet Take 4 mg by mouth daily with breakfast.       . docusate sodium (COLACE) 100 MG capsule Take 100 mg by mouth 2 (two) times daily as needed. For constipation.      . famciclovir (FAMVIR) 500 MG tablet Take 500 mg by mouth daily.       . fluconazole (DIFLUCAN) 200 MG tablet Take 1 tablet (200 mg total) by mouth daily.  7 tablet  0  . methocarbamol (ROBAXIN) 500 MG tablet Take 500 mg by mouth 4 (four) times  daily as needed. For pain.      Marland Kitchen ondansetron (ZOFRAN) 4 MG tablet Take 4 mg by mouth every 8 (eight) hours as needed. Nausea      . oxyCODONE (OXY IR/ROXICODONE) 5 MG immediate release tablet Take 5 mg by mouth every 4 (four) hours as needed. Breakthrough pain      . oxyCODONE (OXYCONTIN) 20 MG 12 hr tablet Take 1 tablet (20 mg total) by mouth every 12 (twelve) hours.  60 tablet  0  . polyethylene glycol (MIRALAX / GLYCOLAX) packet Take 17 g by mouth daily as needed. For constipation.      . sulfamethoxazole-trimethoprim (BACTRIM DS) 800-160 MG per tablet Take 1 tablet by mouth 2 (two) times daily.      Marland Kitchen zolpidem (AMBIEN) 5 MG tablet Take 5 mg by mouth at bedtime as needed. sleep        Physical Findings: The patient is in no acute distress. Patient is alert and oriented.  oral temperature is 98.9 F (37.2 C). His blood pressure is 115/77 and his pulse is 108. His respiration is 20.   Lying on a gurney. No acute distress. No oral pharyngeal thrush. Heart regular in rate and rhythm. Chest clear to auscultation.  He is able to flex his hips and elevate both legs off of the gurney (for a few centimeters ) with difficulty.  He denies lower extremity numbness.   Lab Findings: Lab Results  Component Value Date   WBC 2.9* 05/06/2012   HGB 11.2* 05/06/2012   HCT 32.3* 05/06/2012   MCV 92.0 05/06/2012   PLT 62* 05/06/2012    CMP     Component Value Date/Time   NA 133* 05/06/2012 0836   NA 132 04/05/2012 1202   K 5.0 05/06/2012 0836   K 5.2* 04/05/2012 1202   CL 101 05/06/2012 0836   CL 101 04/05/2012 1202   CO2 22 05/06/2012 0836   CO2 26 04/05/2012 1202   GLUCOSE 187* 05/06/2012 0836   GLUCOSE 145* 04/05/2012 1202   BUN 13 05/06/2012 0836   BUN 22 04/05/2012 1202   CREATININE 0.40* 05/06/2012 0836   CREATININE 0.5* 04/05/2012 1202   CALCIUM 7.9* 05/06/2012 0836   CALCIUM 8.3 04/05/2012 1202   PROT 5.0* 05/06/2012 0836   PROT 5.4* 04/05/2012 1202   ALBUMIN 1.9* 05/06/2012 0836   AST 43* 05/06/2012 0836    AST 33 04/05/2012 1202   ALT 111* 05/06/2012 0836   ALKPHOS 100 05/06/2012 0836   ALKPHOS 62 04/05/2012 1202   BILITOT 0.7 05/06/2012 0836   BILITOT 1.10 04/05/2012 1202   GFRNONAA >90 05/06/2012 0836   GFRAA >90 05/06/2012 0836      Radiographic Findings: Dg Elbow Complete Left  04/25/2012  *RADIOLOGY REPORT*  Clinical Data: 71 year old male pain and swelling.  Abscess drained 5 days ago. History multiple myeloma.  LEFT ELBOW - COMPLETE 3+ VIEW  Comparison: None.  Findings: No joint effusion identified.  Bone mineralization about the elbow is within normal limits.  No destructive or lytic lesion identified.  Degenerative spurring at the olecranon.  Joint spaces preserved.  No fracture or dislocation.  No subcutaneous gas. No radiopaque foreign body identified.  IMPRESSION: No acute or lytic osseous abnormality identified about the left elbow.   Original Report Authenticated By: [name], M.D.     Impression:  The patient is recovering from the effects of radiation.   Plan:  It seems that he has some palliation of his pain but deconditioning remains a problem for him. I do not see any indications for further radiotherapy at this time. I explained to him that I am happy to see him back on an as-needed basis and he is comfortable with that. In the meantime he'll continue physical therapy, home health, and followup with medical oncology. I encouraged her to call if he has any issues in the interim. _____________________________________   Lonie Peak, MD

## 2012-05-10 ENCOUNTER — Ambulatory Visit (HOSPITAL_BASED_OUTPATIENT_CLINIC_OR_DEPARTMENT_OTHER): Payer: BC Managed Care – PPO | Admitting: Medical

## 2012-05-10 ENCOUNTER — Ambulatory Visit (HOSPITAL_BASED_OUTPATIENT_CLINIC_OR_DEPARTMENT_OTHER): Payer: BC Managed Care – PPO

## 2012-05-10 ENCOUNTER — Ambulatory Visit (HOSPITAL_BASED_OUTPATIENT_CLINIC_OR_DEPARTMENT_OTHER): Payer: BC Managed Care – PPO | Admitting: Lab

## 2012-05-10 VITALS — BP 122/76 | HR 109 | Temp 97.6°F | Resp 22 | Ht 68.0 in

## 2012-05-10 DIAGNOSIS — E119 Type 2 diabetes mellitus without complications: Secondary | ICD-10-CM | POA: Insufficient documentation

## 2012-05-10 DIAGNOSIS — M949 Disorder of cartilage, unspecified: Secondary | ICD-10-CM

## 2012-05-10 DIAGNOSIS — R52 Pain, unspecified: Secondary | ICD-10-CM

## 2012-05-10 DIAGNOSIS — C9000 Multiple myeloma not having achieved remission: Secondary | ICD-10-CM

## 2012-05-10 LAB — CBC WITH DIFFERENTIAL (CANCER CENTER ONLY)
BASO%: 0.2 % (ref 0.0–2.0)
Eosinophils Absolute: 0 10*3/uL (ref 0.0–0.5)
LYMPH#: 0.5 10*3/uL — ABNORMAL LOW (ref 0.9–3.3)
MONO#: 0.2 10*3/uL (ref 0.1–0.9)
NEUT#: 4 10*3/uL (ref 1.5–6.5)
Platelets: 98 10*3/uL — ABNORMAL LOW (ref 145–400)
RBC: 3.25 10*6/uL — ABNORMAL LOW (ref 4.20–5.70)
RDW: 16 % — ABNORMAL HIGH (ref 11.1–15.7)
WBC: 4.8 10*3/uL (ref 4.0–10.0)

## 2012-05-10 LAB — CMP (CANCER CENTER ONLY)
ALT(SGPT): 104 U/L — ABNORMAL HIGH (ref 10–47)
AST: 43 U/L — ABNORMAL HIGH (ref 11–38)
Albumin: 2.7 g/dL — ABNORMAL LOW (ref 3.3–5.5)
Alkaline Phosphatase: 113 U/L — ABNORMAL HIGH (ref 26–84)
BUN, Bld: 13 mg/dL (ref 7–22)
Potassium: 5.1 mEq/L — ABNORMAL HIGH (ref 3.3–4.7)
Sodium: 137 mEq/L (ref 128–145)
Total Protein: 5.9 g/dL — ABNORMAL LOW (ref 6.4–8.1)

## 2012-05-10 LAB — TECHNOLOGIST REVIEW CHCC SATELLITE: Tech Review: 5

## 2012-05-10 LAB — CHCC SATELLITE - SMEAR

## 2012-05-10 MED ORDER — ZOLEDRONIC ACID 4 MG/5ML IV CONC
4.0000 mg | Freq: Once | INTRAVENOUS | Status: AC
  Administered 2012-05-10: 4 mg via INTRAVENOUS
  Filled 2012-05-10: qty 5

## 2012-05-10 MED ORDER — DEXAMETHASONE SODIUM PHOSPHATE 4 MG/ML IJ SOLN
20.0000 mg | Freq: Once | INTRAMUSCULAR | Status: AC
  Administered 2012-05-10: 20 mg via INTRAVENOUS

## 2012-05-10 MED ORDER — ALTEPLASE 2 MG IJ SOLR
2.0000 mg | Freq: Once | INTRAMUSCULAR | Status: DC | PRN
  Filled 2012-05-10: qty 2

## 2012-05-10 MED ORDER — INSULIN REGULAR HUMAN 100 UNIT/ML IJ SOLN
15.0000 [IU] | Freq: Once | INTRAMUSCULAR | Status: AC
  Administered 2012-05-10: 15 [IU] via SUBCUTANEOUS

## 2012-05-10 MED ORDER — SODIUM CHLORIDE 0.9 % IV SOLN
Freq: Once | INTRAVENOUS | Status: AC
  Administered 2012-05-10: 12:00:00 via INTRAVENOUS

## 2012-05-10 NOTE — Progress Notes (Signed)
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  Current therapy: Zometa 4mg  IV monthly.  Velcade is currently on hold.  #1 Patient is status post three cycles of velcade (he had one dose in the hospital).   #2 patient is currently receiving Zometa 4mg  IV monthly #3 patient is status post  IR bone tumor Radiofrequency ablation at T9, T10, T11, and T12, with kyphoplasty augmentation on 03/21/2012  #4. The patient is s/p radiotherapy of T8 through the majority of L4 on 04/05/12  Interim history: [name] presents today for an office followup visit.  Overall, he, reports, that he's doing relatively well.  A couple weeks ago.  We did decide to go ahead and hold his Velcade as he was having some progressive weakness.  He had also had to have surgery on his left elbow, secondary to an abscess in the bursa.  He did complete radiotherapy E. of his T8-L4.  Back on 04/05/2012.  He, reports, that he still continues to have some mild back pain, which he drinks around a 2-3.  He now for the most part, is completely sedentary at home.  His wife is really haven't be a full-time caretaker.  I think for the time being that we should continue with his Zometa.  I believe our other problem now is the fact that Mr.Bertsch reports his blood sugars have been running in the high 100s to low 200s.  We will go ahead and get a stat, blood, sugar today since he has not had anything to eat since last night.  I believe we're going to have to go ahead and start him on long-acting insulin.  We will go ahead and plan on giving Mr. Varallo Decadron 20 mg IV today, as such, we will also give him regular insulin 15 units today.  We will make sure.  We have him set up with diabetic teaching.  Mr. Maliszewski reports, that he has a decreased appetite.  He's not having any nausea, vomiting, diarrhea, or constipation.  He denies any chest pain, or shortness of breath.  He denies any cough, fevers, chills, or night sweats.  He is  positive for GERD symptoms.  He denies any obvious, or abnormal bleeding.  He denies any rashes, headaches or vision, changes.   Review of Systems: decreased appetite, intermittent lower back pain, otherwise: Pt. Denies any changes in their vision, hearing, adenopathy, fevers, chills, nausea, vomiting, diarrhea, constipation, chest pain, shortness of breath, passing blood, passing out, blacking out,  any changes in skin, joints, neurologic or psychiatric except as noted.  Physical Exam: This is a pleasant, somewhat disheveled appearing, 71 year old, gentleman, in no obvious distress. Vitals: Temperature 97.6 degrees, pulse 109, respirations 22 blood pressure 120/76.  Weight was not taken today. HEENT reveals a normocephalic, atraumatic skull, no scleral icterus, no oral lesions  Neck is supple without any cervical or supraclavicular adenopathy.  Lungs are clear to auscultation bilaterally. There are no wheezes, rales or rhonci Cardiac is regular rate and rhythm with a normal S1 and S2. There are no murmurs, rubs, or bruits.  Abdomen is soft with good bowel sounds, there is no palpable mass. There is no palpable hepatosplenomegaly. There is no palpable fluid wave.  Musculoskeletal no tenderness of the spine, ribs, or hips.  Extremities there are no clubbing, cyanosis, or edema.  Skin no petechia, purpura or ecchymosis Neurologic is nonfocal.  Laboratory Data: White count 4.8, hemoglobin 10.6, hematocrit 30.8, platelets 98,000 On 04/05/2012.  His M spike was  0.57 g/dL, IgG 161,  kappa free light chain 0.03 mg/dL His 09-UEAV urine, that was done in the hospital, had 5.3 mg protein.  There was no monoclonal Bence-Jones protein noted in his 24-hour urine.  Metastatic bone survey: 03/15/2012 Impression: Multiple lesions of multiple myeloma including destruction of the T12 vertebral body.  Lytic lesions elsewhere in the thoracic spine.  MRI of the lumbar spine with and without contrast:  03/15/2012 Impression: Tumor involvement of all of the visualized vertebral from L1 through the lowest aspect of the sacrum.  Tumor involvement of the ileum bilaterally.  Most notable tumor involvement.  L3 vertebral body, and right aspect of the sacral ala. No epidural extension of tumor in the lumbar, sacral spine. Lumbar spine, degenerative changes.  Current Outpatient Prescriptions on File Prior to Visit  Medication Sig Dispense Refill  . amLODipine (NORVASC) 5 MG tablet Take 1 tablet (5 mg total) by mouth daily.  30 tablet  1  . baclofen (LIORESAL) 5 mg TABS Take 0.5 tablets (5 mg total) by mouth 3 (three) times daily.  90 tablet  0  . dexamethasone (DECADRON) 4 MG tablet Take 4 mg by mouth daily with breakfast.       . docusate sodium (COLACE) 100 MG capsule Take 100 mg by mouth 2 (two) times daily as needed. For constipation.      . famciclovir (FAMVIR) 500 MG tablet Take 500 mg by mouth daily.       . fluconazole (DIFLUCAN) 200 MG tablet Take 1 tablet (200 mg total) by mouth daily.  7 tablet  0  . methocarbamol (ROBAXIN) 500 MG tablet Take 500 mg by mouth 4 (four) times daily as needed. For pain.      Marland Kitchen ondansetron (ZOFRAN) 4 MG tablet Take 4 mg by mouth every 8 (eight) hours as needed. Nausea      . oxyCODONE (OXY IR/ROXICODONE) 5 MG immediate release tablet Take 5 mg by mouth every 4 (four) hours as needed. Breakthrough pain      . oxyCODONE (OXYCONTIN) 20 MG 12 hr tablet Take 1 tablet (20 mg total) by mouth every 12 (twelve) hours.  60 tablet  0  . polyethylene glycol (MIRALAX / GLYCOLAX) packet Take 17 g by mouth daily as needed. For constipation.      . sulfamethoxazole-trimethoprim (BACTRIM DS) 800-160 MG per tablet Take 1 tablet by mouth 2 (two) times daily.      Marland Kitchen zolpidem (AMBIEN) 5 MG tablet Take 5 mg by mouth at bedtime as needed. sleep       Assessment/Plan: This is a pleasant-appearing 71 year old, gentleman, with the following issues:  #1 new diagnosis of multiple  myeloma-he is s/p 3 cycles of velcade (he had one dose in the hospital)  He receives Zometa 4mg  IV monthly. We will continue to hold the Velcade secondary to poor tolerance and weakness.  We will continue with Zometa 4mg  IV monthly.  Overall, [name], protein studies have come down quite nicely.  Again, we need to look at the big picture of things, and right now ,in terms of quality of life, will treat supportively.  He is  s/p radiotherapy of T8 through the majority of L4 on 04/05/12  Again, he is status post radiofrequency ablation at T9-T12 with kyphoplasty augmentation on 03/21/2012.  We will also go ahead and get [name] a dose of Decadron 20 mg IV today.  #2 bony lesions-he will continue on Zometa 4 mg IV every 4 weeks  #3 pain-he  currently is on OxyContin and oxycodone as needed.  #4 new-onset diabetes.  [name], fasting glucose today was 203.  We will go ahead and get him set up on IV long-acting Lantus, as well as diabetic teaching.  #5.  Followup- I will plan on seeing [name].  Back on 05/24/2012, but before then should there be questions or concerns.

## 2012-05-10 NOTE — Progress Notes (Signed)
10:29 AM Report received from [facility]. Patient placed in bedroom, to be seen by Eunice Blase, PA. Lance Sell Johnson  1315 Report given to [employer]. Teola Bradley, Chelbie Jarnagin [employer]

## 2012-05-10 NOTE — Patient Instructions (Addendum)
Dexamethasone injection What is this medicine? DEXAMETHASONE (dex a METH a sone) is a corticosteroid. It is used to treat inflammation of the skin, joints, lungs, and other organs. Common conditions treated include asthma, allergies, and arthritis. It is also used for other conditions, like blood disorders and diseases of the adrenal glands. This medicine may be used for other purposes; ask your health care provider or pharmacist if you have questions. What should I tell my health care provider before I take this medicine? They need to know if you have any of these conditions: -blood clotting problems -Cushing's syndrome -diabetes -glaucoma -heart problems or disease -high blood pressure -infection like herpes, measles, tuberculosis, or chickenpox -kidney disease -liver disease -mental problems -myasthenia gravis -osteoporosis -previous heart attack -seizures -stomach, ulcer or intestine disease including colitis and diverticulitis -thyroid problem -an unusual or allergic reaction to dexamethasone, corticosteroids, other medicines, lactose, foods, dyes, or preservatives -pregnant or trying to get pregnant -breast-feeding How should I use this medicine? This medicine is for injection into a muscle, joint, lesion, soft tissue, or vein. It is given by a health care professional in a hospital or clinic setting. Talk to your pediatrician regarding the use of this medicine in children. Special care may be needed. Overdosage: If you think you have taken too much of this medicine contact a poison control center or emergency room at once. NOTE: This medicine is only for you. Do not share this medicine with others. What if I miss a dose? This may not apply. If you are having a series of injections over a prolonged period, try not to miss an appointment. Call your doctor or health care professional to reschedule if you are unable to keep an appointment. What may interact with this medicine? Do  not take this medicine with any of the following medications: -mifepristone, RU-486 -vaccines This medicine may also interact with the following medications: -amphotericin B -antibiotics like clarithromycin, erythromycin, and troleandomycin -aspirin and aspirin-like drugs -barbiturates like phenobarbital -carbamazepine -cholestyramine -cholinesterase inhibitors like donepezil, galantamine, rivastigmine, and tacrine -cyclosporine -digoxin -diuretics -ephedrine -male hormones, like estrogens or progestins and birth control pills -indinavir -isoniazid -ketoconazole -medicines for diabetes -medicines that improve muscle tone or strength for conditions like myasthenia gravis -NSAIDs, medicines for pain and inflammation, like ibuprofen or naproxen -phenytoin -rifampin -thalidomide -warfarin This list may not describe all possible interactions. Give your health care provider a list of all the medicines, herbs, non-prescription drugs, or dietary supplements you use. Also tell them if you smoke, drink alcohol, or use illegal drugs. Some items may interact with your medicine. What should I watch for while using this medicine? Your condition will be monitored carefully while you are receiving this medicine. If you are taking this medicine for a long time, carry an identification card with your name and address, the type and dose of your medicine, and your doctor's name and address. This medicine may increase your risk of getting an infection. Stay away from people who are sick. Tell your doctor or health care professional if you are around anyone with measles or chickenpox. Talk to your health care provider before you get any vaccines that you take this medicine. If you are going to have surgery, tell your doctor or health care professional that you have taken this medicine within the last twelve months. Ask your doctor or health care professional about your diet. You may need to lower the  amount of salt you eat. The medicine can increase your blood sugar. If you   are a diabetic check with your doctor if you need help adjusting the dose of your diabetic medicine. What side effects may I notice from receiving this medicine? Side effects that you should report to your doctor or health care professional as soon as possible: -allergic reactions like skin rash, itching or hives, swelling of the face, lips, or tongue -black or tarry stools -change in the amount of urine -changes in vision -confusion, excitement, restlessness, a false sense of well-being -fever, sore throat, sneezing, cough, or other signs of infection, wounds that will not heal -hallucinations -increased thirst -mental depression, mood swings, mistaken feelings of self importance or of being mistreated -pain in hips, back, ribs, arms, shoulders, or legs -pain, redness, or irritation at the injection site -redness, blistering, peeling or loosening of the skin, including inside the mouth -rounding out of face -swelling of feet or lower legs -unusual bleeding or bruising -unusual tired or weak -wounds that do not heal Side effects that usually do not require medical attention (report to your doctor or health care professional if they continue or are bothersome): -diarrhea or constipation -change in taste -headache -nausea, vomiting -skin problems, acne, thin and shiny skin -touble sleeping -unusual growth of hair on the face or body -weight gain This list may not describe all possible side effects. Call your doctor for medical advice about side effects. You may report side effects to FDA at 1-800-FDA-1088. Where should I keep my medicine? This drug is given in a hospital or clinic and will not be stored at home. NOTE: This sheet is a summary. It may not cover all possible information. If you have questions about this medicine, talk to your doctor, pharmacist, or health care provider.  2012, [employer]. (11/16/2007 2:04:12 PM) 

## 2012-05-12 LAB — IGG, IGA, IGM
IgA: 183 mg/dL (ref 68–379)
IgM, Serum: 165 mg/dL (ref 41–251)

## 2012-05-12 LAB — PROTEIN ELECTROPHORESIS, SERUM, WITH REFLEX
Gamma Globulin: 13.2 % (ref 11.1–18.8)
M-Spike, %: 0.18 g/dL
Total Protein, Serum Electrophoresis: 5.4 g/dL — ABNORMAL LOW (ref 6.0–8.3)

## 2012-05-12 LAB — KAPPA/LAMBDA LIGHT CHAINS
Kappa:Lambda Ratio: 0.29 (ref 0.26–1.65)
Lambda Free Lght Chn: 0.92 mg/dL (ref 0.57–2.63)

## 2012-05-13 ENCOUNTER — Encounter (HOSPITAL_COMMUNITY): Payer: Self-pay | Admitting: Emergency Medicine

## 2012-05-13 ENCOUNTER — Inpatient Hospital Stay (HOSPITAL_COMMUNITY)
Admission: EM | Admit: 2012-05-13 | Discharge: 2012-05-24 | DRG: 585 | Disposition: A | Discharge status: Skilled Nursing Facility | Payer: BC Managed Care – PPO | Attending: Internal Medicine | Admitting: Internal Medicine

## 2012-05-13 ENCOUNTER — Other Ambulatory Visit: Payer: Self-pay

## 2012-05-13 ENCOUNTER — Emergency Department (HOSPITAL_COMMUNITY): Payer: BC Managed Care – PPO

## 2012-05-13 ENCOUNTER — Inpatient Hospital Stay (HOSPITAL_COMMUNITY): Payer: BC Managed Care – PPO

## 2012-05-13 ENCOUNTER — Encounter (HOSPITAL_COMMUNITY): Payer: Self-pay | Admitting: Anesthesiology

## 2012-05-13 ENCOUNTER — Inpatient Hospital Stay (HOSPITAL_COMMUNITY): Payer: BC Managed Care – PPO | Admitting: Anesthesiology

## 2012-05-13 DIAGNOSIS — I2699 Other pulmonary embolism without acute cor pulmonale: Secondary | ICD-10-CM

## 2012-05-13 DIAGNOSIS — J9600 Acute respiratory failure, unspecified whether with hypoxia or hypercapnia: Secondary | ICD-10-CM

## 2012-05-13 DIAGNOSIS — F32A Depression, unspecified: Secondary | ICD-10-CM | POA: Diagnosis present

## 2012-05-13 DIAGNOSIS — R0902 Hypoxemia: Secondary | ICD-10-CM

## 2012-05-13 DIAGNOSIS — E2749 Other adrenocortical insufficiency: Secondary | ICD-10-CM | POA: Diagnosis present

## 2012-05-13 DIAGNOSIS — M899 Disorder of bone, unspecified: Secondary | ICD-10-CM

## 2012-05-13 DIAGNOSIS — K631 Perforation of intestine (nontraumatic): Secondary | ICD-10-CM

## 2012-05-13 DIAGNOSIS — D630 Anemia in neoplastic disease: Secondary | ICD-10-CM | POA: Diagnosis present

## 2012-05-13 DIAGNOSIS — K5909 Other constipation: Secondary | ICD-10-CM | POA: Diagnosis present

## 2012-05-13 DIAGNOSIS — R4182 Altered mental status, unspecified: Secondary | ICD-10-CM

## 2012-05-13 DIAGNOSIS — E010 Iodine-deficiency related diffuse (endemic) goiter: Secondary | ICD-10-CM

## 2012-05-13 DIAGNOSIS — R109 Unspecified abdominal pain: Secondary | ICD-10-CM

## 2012-05-13 DIAGNOSIS — I82409 Acute embolism and thrombosis of unspecified deep veins of unspecified lower extremity: Secondary | ICD-10-CM | POA: Diagnosis present

## 2012-05-13 DIAGNOSIS — M4810 Ankylosing hyperostosis [Forestier], site unspecified: Secondary | ICD-10-CM

## 2012-05-13 DIAGNOSIS — I2510 Atherosclerotic heart disease of native coronary artery without angina pectoris: Secondary | ICD-10-CM

## 2012-05-13 DIAGNOSIS — E119 Type 2 diabetes mellitus without complications: Secondary | ICD-10-CM | POA: Diagnosis present

## 2012-05-13 DIAGNOSIS — E871 Hypo-osmolality and hyponatremia: Secondary | ICD-10-CM

## 2012-05-13 DIAGNOSIS — E876 Hypokalemia: Secondary | ICD-10-CM | POA: Diagnosis not present

## 2012-05-13 DIAGNOSIS — K219 Gastro-esophageal reflux disease without esophagitis: Secondary | ICD-10-CM | POA: Diagnosis present

## 2012-05-13 DIAGNOSIS — C9000 Multiple myeloma not having achieved remission: Secondary | ICD-10-CM | POA: Diagnosis present

## 2012-05-13 DIAGNOSIS — F3289 Other specified depressive episodes: Secondary | ICD-10-CM | POA: Diagnosis present

## 2012-05-13 DIAGNOSIS — K658 Other peritonitis: Secondary | ICD-10-CM | POA: Diagnosis present

## 2012-05-13 DIAGNOSIS — R5381 Other malaise: Secondary | ICD-10-CM | POA: Diagnosis present

## 2012-05-13 DIAGNOSIS — I10 Essential (primary) hypertension: Secondary | ICD-10-CM

## 2012-05-13 DIAGNOSIS — K449 Diaphragmatic hernia without obstruction or gangrene: Secondary | ICD-10-CM | POA: Diagnosis present

## 2012-05-13 DIAGNOSIS — F329 Major depressive disorder, single episode, unspecified: Secondary | ICD-10-CM

## 2012-05-13 DIAGNOSIS — M549 Dorsalgia, unspecified: Secondary | ICD-10-CM

## 2012-05-13 DIAGNOSIS — K5900 Constipation, unspecified: Secondary | ICD-10-CM

## 2012-05-13 DIAGNOSIS — E46 Unspecified protein-calorie malnutrition: Secondary | ICD-10-CM | POA: Diagnosis present

## 2012-05-13 DIAGNOSIS — R627 Adult failure to thrive: Secondary | ICD-10-CM | POA: Diagnosis present

## 2012-05-13 DIAGNOSIS — K5732 Diverticulitis of large intestine without perforation or abscess without bleeding: Principal | ICD-10-CM | POA: Diagnosis present

## 2012-05-13 LAB — URINALYSIS, ROUTINE W REFLEX MICROSCOPIC
Bilirubin Urine: NEGATIVE
Glucose, UA: 500 mg/dL — AB
Hgb urine dipstick: NEGATIVE
Ketones, ur: NEGATIVE mg/dL
Leukocytes, UA: NEGATIVE
Protein, ur: NEGATIVE mg/dL
pH: 5.5 (ref 5.0–8.0)

## 2012-05-13 LAB — COMPREHENSIVE METABOLIC PANEL
ALT: 6 U/L (ref 0–53)
AST: 11 U/L (ref 0–37)
Alkaline Phosphatase: 99 U/L (ref 39–117)
CO2: 25 mEq/L (ref 19–32)
Calcium: 9.6 mg/dL (ref 8.4–10.5)
Chloride: 97 mEq/L (ref 96–112)
GFR calc Af Amer: >90 mL/min (ref >90)
GFR calc non Af Amer: >90 mL/min (ref >90)
Glucose, Bld: 163 mg/dL — ABNORMAL HIGH (ref 70–99)
Sodium: 132 mEq/L — ABNORMAL LOW (ref 135–145)
Total Bilirubin: 0.4 mg/dL (ref 0.3–1.2)

## 2012-05-13 LAB — CBC
Hemoglobin: 11.5 g/dL — ABNORMAL LOW (ref 13.0–17.0)
MCH: 31.9 pg (ref 26.0–34.0)
Platelets: 128 10*3/uL — ABNORMAL LOW (ref 150–400)
RBC: 3.61 MIL/uL — ABNORMAL LOW (ref 4.22–5.81)
WBC: 5.8 10*3/uL (ref 4.0–10.5)

## 2012-05-13 LAB — DIFFERENTIAL
Basophils Absolute: 0 10*3/uL (ref 0.0–0.1)
Eosinophils Relative: 0 % (ref 0–5)
Lymphocytes Relative: 8 % — ABNORMAL LOW (ref 12–46)
Lymphs Abs: 0.4 10*3/uL — ABNORMAL LOW (ref 0.7–4.0)
Neutrophils Relative %: 89 % — ABNORMAL HIGH (ref 43–77)

## 2012-05-13 LAB — BASIC METABOLIC PANEL
CO2: 22 mEq/L (ref 19–32)
GFR calc non Af Amer: >90 mL/min (ref >90)
Glucose, Bld: 292 mg/dL — ABNORMAL HIGH (ref 70–99)
Potassium: 5 mEq/L (ref 3.5–5.1)
Sodium: 133 mEq/L — ABNORMAL LOW (ref 135–145)

## 2012-05-13 LAB — POCT I-STAT TROPONIN I: Troponin i, poc: 0.01 ng/mL (ref 0.00–0.08)

## 2012-05-13 LAB — GLUCOSE, CAPILLARY

## 2012-05-13 MED ORDER — SODIUM CHLORIDE 0.9 % IV SOLN
1.0000 g | Freq: Three times a day (TID) | INTRAVENOUS | Status: DC
  Administered 2012-05-13 – 2012-05-21 (×23): 1 g via INTRAVENOUS
  Filled 2012-05-13 (×25): qty 1

## 2012-05-13 MED ORDER — SODIUM CHLORIDE 0.9 % IV SOLN
INTRAVENOUS | Status: DC
  Administered 2012-05-13: 100 mL/h via INTRAVENOUS

## 2012-05-13 MED ORDER — SODIUM CHLORIDE 0.9 % IV SOLN
250.0000 mL | INTRAVENOUS | Status: DC | PRN
  Administered 2012-05-14: 11:00:00 via INTRAVENOUS

## 2012-05-13 MED ORDER — FENTANYL CITRATE 0.05 MG/ML IJ SOLN
25.0000 ug | INTRAMUSCULAR | Status: DC | PRN
  Administered 2012-05-14: 50 ug via INTRAVENOUS
  Filled 2012-05-13: qty 2

## 2012-05-13 MED ORDER — SODIUM CHLORIDE 0.9 % IV SOLN: 1000.0000 mL | INTRAVENOUS | Status: DC

## 2012-05-13 MED ORDER — SODIUM CHLORIDE 0.9 % IV SOLN
1000.0000 mL | Freq: Once | INTRAVENOUS | Status: DC
Start: 2012-05-13 — End: 2012-05-15

## 2012-05-13 MED ORDER — METRONIDAZOLE IN NACL 5-0.79 MG/ML-% IV SOLN
500.0000 mg | Freq: Three times a day (TID) | INTRAVENOUS | Status: DC
  Administered 2012-05-13: 500 mg via INTRAVENOUS
  Filled 2012-05-13: qty 100

## 2012-05-13 MED ORDER — SODIUM CHLORIDE 0.9 % IV BOLUS (SEPSIS)
500.0000 mL | Freq: Once | INTRAVENOUS | Status: AC
  Administered 2012-05-13: 19:00:00 via INTRAVENOUS

## 2012-05-13 MED ORDER — MORPHINE SULFATE 2 MG/ML IJ SOLN
2.0000 mg | Freq: Once | INTRAMUSCULAR | Status: AC
  Administered 2012-05-13: 2 mg via INTRAVENOUS

## 2012-05-13 MED ORDER — MORPHINE SULFATE 2 MG/ML IJ SOLN
INTRAMUSCULAR | Status: AC
  Filled 2012-05-13: qty 1

## 2012-05-13 MED ORDER — INSULIN ASPART 100 UNIT/ML ~~LOC~~ SOLN
0.0000 [IU] | SUBCUTANEOUS | Status: DC
Start: 2012-05-14 — End: 2012-05-18
  Administered 2012-05-14: 5 [IU] via SUBCUTANEOUS
  Administered 2012-05-14 (×2): 2 [IU] via SUBCUTANEOUS
  Administered 2012-05-14 – 2012-05-15 (×2): 3 [IU] via SUBCUTANEOUS
  Administered 2012-05-15: 2 [IU] via SUBCUTANEOUS
  Administered 2012-05-15 (×3): 1 [IU] via SUBCUTANEOUS
  Administered 2012-05-15: 2 [IU] via SUBCUTANEOUS
  Administered 2012-05-16 (×4): 1 [IU] via SUBCUTANEOUS
  Administered 2012-05-17 (×2): 0 [IU] via SUBCUTANEOUS

## 2012-05-13 MED ORDER — SODIUM CHLORIDE 0.9 % IV SOLN
1000.0000 mL | Freq: Once | INTRAVENOUS | Status: AC
  Administered 2012-05-13: 1000 mL via INTRAVENOUS

## 2012-05-13 MED ORDER — INSULIN REGULAR HUMAN 100 UNIT/ML IJ SOLN: 2.0000 [IU] | Freq: Four times a day (QID) | INTRAMUSCULAR | Status: DC

## 2012-05-13 MED ORDER — LEVOFLOXACIN IN D5W 750 MG/150ML IV SOLN
750.0000 mg | INTRAVENOUS | Status: DC
  Administered 2012-05-13: 750 mg via INTRAVENOUS
  Filled 2012-05-13: qty 150

## 2012-05-13 MED ORDER — HYDROCORTISONE SOD SUCCINATE 100 MG IJ SOLR
100.0000 mg | Freq: Three times a day (TID) | INTRAMUSCULAR | Status: DC
  Administered 2012-05-13 – 2012-05-16 (×8): 100 mg via INTRAVENOUS
  Filled 2012-05-13 (×9): qty 2

## 2012-05-13 MED ORDER — IOHEXOL 350 MG/ML SOLN
125.0000 mL | Freq: Once | INTRAVENOUS | Status: AC | PRN
  Administered 2012-05-13: 125 mL via INTRAVENOUS

## 2012-05-13 NOTE — ED Notes (Signed)
Patient transported to CT 

## 2012-05-13 NOTE — ED Notes (Signed)
AVW:[id number]<XB> Expected date:05/13/12<BR> Expected time: 5:05 PM<BR> Means of arrival:Ambulance<BR> Comments:<BR>

## 2012-05-13 NOTE — ED Notes (Signed)
CBG taken at 22:34 was done in error. RN Tora Duck notified.

## 2012-05-13 NOTE — ED Notes (Signed)
Pt able to speak in complete sentences.  Lungs clear and equal.  Denies chest pain.  States he does spend a lot of time lying down

## 2012-05-13 NOTE — Progress Notes (Signed)
ANTIBIOTIC CONSULT NOTE - INITIAL  Pharmacy Consult for Merrem Indication: abdominal infection  Allergies  Allergen Reactions  . Penicillins Swelling    Patient Measurements: Weight: 215 lb (97.523 kg)   Vital Signs: Temp: 99.6 F (37.6 C) (10/05 1852) Temp src: Rectal (10/05 1852) BP: 127/94 mmHg (10/05 1815) Pulse Rate: 120  (10/05 1900) Intake/Output from previous day:   Intake/Output from this shift: Total I/O In: 500 [I.V.:500] Out: -   Labs:  Basename 05/13/12 1821 05/13/12 1740  WBC -- 5.8  HGB -- 11.5*  PLT -- 128*  LABCREA -- --  CREATININE 0.74 0.50   The CrCl is unknown because both a height and weight (above a minimum accepted value) are required for this calculation. No results found for this basename: VANCOTROUGH:2,VANCOPEAK:2,VANCORANDOM:2,GENTTROUGH:2,GENTPEAK:2,GENTRANDOM:2,TOBRATROUGH:2,TOBRAPEAK:2,TOBRARND:2,AMIKACINPEAK:2,AMIKACINTROU:2,AMIKACIN:2, in the last 72 hours   Microbiology: Recent Results (from the past 720 hour(s))  WOUND CULTURE     Status: Normal   Collection Time   04/21/12  9:21 PM      Component Value Range Status Comment   Specimen Description ARM   Final    Special Requests Immunocompromised   Final    Gram Stain     Final    Value: MODERATE WBC PRESENT,BOTH PMN AND MONONUCLEAR     NO SQUAMOUS EPITHELIAL CELLS SEEN     ABUNDANT GRAM POSITIVE COCCI IN PAIRS     IN CLUSTERS   Culture     Final    Value: ABUNDANT STAPHYLOCOCCUS AUREUS     Note: RIFAMPIN AND GENTAMICIN SHOULD NOT BE USED AS SINGLE DRUGS FOR TREATMENT OF STAPH INFECTIONS.   Report Status 04/24/2012 FINAL   Final    Organism ID, Bacteria STAPHYLOCOCCUS AUREUS   Final   ANAEROBIC CULTURE     Status: Normal   Collection Time   04/25/12  5:20 PM      Component Value Range Status Comment   Specimen Description ARM   Final    Special Requests NONE   Final    Gram Stain     Final    Value: MODERATE WBC PRESENT,BOTH PMN AND MONONUCLEAR     NO SQUAMOUS EPITHELIAL  CELLS SEEN     FEW GRAM POSITIVE COCCI     IN PAIRS IN CLUSTERS   Culture NO ANAEROBES ISOLATED   Final    Report Status 04/30/2012 FINAL   Final   WOUND CULTURE     Status: Normal   Collection Time   04/25/12  6:11 PM      Component Value Range Status Comment   Specimen Description ARM   Final    Special Requests Normal   Final    Gram Stain     Final    Value: MODERATE WBC PRESENT,BOTH PMN AND MONONUCLEAR     NO SQUAMOUS EPITHELIAL CELLS SEEN     FEW GRAM POSITIVE COCCI     IN PAIRS IN CLUSTERS   Culture     Final    Value: ABUNDANT STAPHYLOCOCCUS AUREUS     Note: RIFAMPIN AND GENTAMICIN SHOULD NOT BE USED AS SINGLE DRUGS FOR TREATMENT OF STAPH INFECTIONS.   Report Status 04/28/2012 FINAL   Final    Organism ID, Bacteria STAPHYLOCOCCUS AUREUS   Final     Medical History: Past Medical History  Diagnosis Date  . Arthritis   . GERD (gastroesophageal reflux disease)   . Complication of anesthesia     aspiration during surgery, difficult to wake up  . Multiple myeloma   .  Status post radiation therapy 03/23/12 - 04/05/12    T8 - L4/ 20 Gy/ 10 Fractions  . Dysphagia 05/06/12    ED Visit    Medications:  Scheduled:    . sodium chloride  1,000 mL Intravenous Once   Followed by  . sodium chloride  1,000 mL Intravenous Once  . hydrocortisone sod succinate (SOLU-CORTEF) injection  100 mg Intravenous Q8H  . insulin regular  2-8 Units Subcutaneous Q6H  . levofloxacin (LEVAQUIN) IV  750 mg Intravenous Q24H  . metronidazole  500 mg Intravenous Q8H  . sodium chloride  500 mL Intravenous Once   Infusions:    . sodium chloride    . sodium chloride     PRN: sodium chloride, fentaNYL, iohexol Assessment: Pt with history of multiple myeloma and admitted due to SOB. Pt currently on Levaquin and Flagyl and Merrem is ordered for abdominal infection. MD aware of PCN allergy=swelling but she wants to give Merrem. Per MD, d/c Levaquin and Flagyl. She will also inform RN to check for  reaction after Merrem given.  Plan:  .Merrem 1gm IV Q8h .Will f/u Scr and adjust dose as needed.   Dorethea Clan 05/13/2012,9:39 PM

## 2012-05-13 NOTE — ED Provider Notes (Addendum)
History     CSN: [id number]  Arrival date & time 05/13/12  1722   First MD Initiated Contact with Patient 05/13/12 1737      Chief Complaint  Patient presents with  . Shortness of Breath    (Consider location/radiation/quality/duration/timing/severity/associated sxs/prior treatment) HPI Patient with history of multiple myeloma complaining of general malaise, weakness, and dyspnea after awakening today.  Noted bs elevated at 275 today.  Patient has had borderline diabetes, not on meds.  Diagnosed with multiple myeloma in July 31.  No history of lung problems, quit smoking thirty years ago.  No fever, cough, chest pain, rhinnorhea, congestion, abdominal pain, nausea , vomiting, diarrhea, peripheral edema, ho dvt, or pe, headache, lateralized weakness, or decreased po intake, or dysuria.  Patient with some increased frequency of urination.  Dyspnea improved here with oxygen.  Patient states he does not sit up or get up at home.   Past Medical History  Diagnosis Date  . Arthritis   . GERD (gastroesophageal reflux disease)   . Complication of anesthesia     aspiration during surgery, difficult to wake up  . Multiple myeloma   . Status post radiation therapy 03/23/12 - 04/05/12    T8 - L4/ 20 Gy/ 10 Fractions  . Dysphagia 05/06/12    ED Visit    Past Surgical History  Procedure Date  . Fracture surgery     ankle  . Hemorrhoid surgery   . Back surgery     47 years ago    Family History  Problem Relation Age of Onset  . Cancer Maternal Grandfather   . Cancer Maternal Grandmother     History  Substance Use Topics  . Smoking status: Former Smoker    Types: Cigarettes    Quit date: 03/09/1978  . Smokeless tobacco: Never Used  . Alcohol Use: Yes     1 glass of wine or a burbon daily.      Review of Systems  Constitutional: Positive for activity change, appetite change and fatigue. Negative for fever and chills.  HENT: Negative for rhinorrhea, drooling and neck stiffness.     Eyes: Negative for visual disturbance.  Respiratory: Positive for shortness of breath.   Cardiovascular: Negative for chest pain.  Gastrointestinal: Negative for vomiting, diarrhea and blood in stool.  Genitourinary: Negative for dysuria, frequency and decreased urine volume.  Musculoskeletal: Negative for myalgias and joint swelling.  Skin: Negative for rash.  Neurological: Negative for weakness.  Hematological: Negative for adenopathy.  Psychiatric/Behavioral: Negative for agitation.    Allergies  Penicillins  Home Medications   Current Outpatient Rx  Name Route Sig Dispense Refill  . AMLODIPINE BESYLATE 5 MG PO TABS Oral Take 5 mg by mouth every morning.    Marland Kitchen BACLOFEN 10 MG PO TABS Oral Take 10 mg by mouth 3 (three) times daily.    Marland Kitchen BISACODYL 5 MG PO TBEC Oral Take 5-10 mg by mouth at bedtime.    Marland Kitchen DEXAMETHASONE 4 MG PO TABS Oral Take 4 mg by mouth daily with breakfast.     . FAMCICLOVIR 500 MG PO TABS Oral Take 500 mg by mouth every morning.     Marland Kitchen FLUCONAZOLE 200 MG PO TABS Oral Take 200 mg by mouth every morning.    Marland Kitchen METHOCARBAMOL 500 MG PO TABS Oral Take 500 mg by mouth 4 (four) times daily as needed. For pain.    Marland Kitchen ONDANSETRON HCL 4 MG PO TABS Oral Take 4 mg by mouth every 8 (eight)  hours as needed. Nausea    . OXYCODONE HCL 5 MG PO TABS Oral Take 5 mg by mouth every 4 (four) hours as needed. Breakthrough pain    . OXYCODONE HCL ER 20 MG PO TB12 Oral Take 1 tablet (20 mg total) by mouth every 12 (twelve) hours. 60 tablet 0  . POLYETHYLENE GLYCOL 3350 PO PACK Oral Take 17 g by mouth daily as needed. For constipation.    . SULFAMETHOXAZOLE-TMP DS 800-160 MG PO TABS Oral Take 1 tablet by mouth 2 (two) times daily.    Marland Kitchen ZOLPIDEM TARTRATE 5 MG PO TABS Oral Take 5 mg by mouth at bedtime as needed. sleep      BP 131/92  Pulse 135  Resp 22  SpO2 93%  Physical Exam  Nursing note and vitals reviewed. Constitutional: He is oriented to person, place, and time. He appears  well-developed and well-nourished.  HENT:  Head: Normocephalic and atraumatic.  Eyes: Conjunctivae normal and EOM are normal. Pupils are equal, round, and reactive to light.  Neck: Neck supple.  Cardiovascular: Tachycardia present.   Pulmonary/Chest:       Decreased bs bilateral bases  Abdominal: Soft.       Distended mild tenderness ruq and epigastrium   Musculoskeletal: Normal range of motion. He exhibits no tenderness.       Some chronic venous stasis changes  Neurological: He is alert and oriented to person, place, and time.  Skin: Skin is warm and dry.  Psychiatric: He has a normal mood and affect. His behavior is normal. Judgment and thought content normal.    ED Course  Procedures (including critical care time)   Labs Reviewed  BASIC METABOLIC PANEL  CBC   No results found.   No diagnosis found.   Date: 05/13/2012  Rate: 133  Rhythm: sinus tachycardia  QRS Axis: normal  Intervals: normal  ST/T Wave abnormalities: normal  Conduction Disutrbances:none  Narrative Interpretation:   Old EKG Reviewed: changes noted    MDM  Initial free air on chest x-Derl Abalos reported to me by Dr. [name]. Patient's finding discussed with Dr. Gerrit Friends and CT of abdomen and pelvis obtained. Patient reexamined and has slight right upper quadrant and epigastric tenderness. CT results called to me by Dr. Azucena Kuba patient's CT results significant for bilateral lower lobe pulmonary embolism as well as free air in the abdomen with source likely from left lower quadrant small bowel or diverticulitis. Patient is allergic to Zosyn. The antibiotics are being ordered.   Patient's care discussed with Dr. Frederico Hamman on call for critical care. She advises that the fallopian to see the patient.  I have discussed the above with the patient and his wife. They voice understanding of the plan. The patient remains tachycardic at 120 with systolic blood pressure 124.        Hilario Quarry, MD 05/13/12 2055  Drs.  Gerkin and Giddings at bedside.  CRITICAL CARE Performed by: Hilario Quarry   Total critical care time: 90  Critical care time was exclusive of separately billable procedures and treating other patients.  Critical care was necessary to treat or prevent imminent or life-threatening deterioration.  Critical care was time spent personally by me on the following activities: development of treatment plan with patient and/or surrogate as well as nursing, discussions with consultants, evaluation of patient's response to treatment, examination of patient, obtaining history from patient or surrogate, ordering and performing treatments and interventions, ordering and review of laboratory studies, ordering and review of radiographic  studies, pulse oximetry and re-evaluation of patient's condition.   Hilario Quarry, MD 05/13/12 1610  Hilario Quarry, MD 07/05/12 [phone]

## 2012-05-13 NOTE — ED Notes (Addendum)
Culture set 1 drawn by RN. Culture set 2 drawn by MD.

## 2012-05-13 NOTE — ED Notes (Signed)
Patient transported to X-ray 

## 2012-05-13 NOTE — ED Notes (Signed)
Pt from home.  An hour ago pt states he began to get sob.  Put on pulse ox and it showed 85%.  Fire dept arrived and pt put on 4L [place] which brought o2 sat at 97%.  Pt has hx of multiple myloma and last treatment 3 weeks ago.  Pt denies asthma/ copd.  Pt also took 5mg  oxycodone prior to ems arrival for chronic back pain.  Pt states he woke up this am and felt achy.

## 2012-05-13 NOTE — H&P (Signed)
Name: Robert Berger MRN: [id number] DOB: March 19, 1941    LOS: 1  Referring Provider:  Dr. Rosalia Hammers Reason for Referral:  PE, bowel perforation  PULMONARY / CRITICAL CARE MEDICINE  HPI:  Robert Berger is a very pleasant 71 y/o man with a past medical history significant for multiple myeloma with multiple bone lytic lesions currently being treated with velcade and zometa.  He presented to the [facility] ED on the evening of 10/5 with shortness of breath.  He has a pulse oximeter at home and noted that his saturations were in the mid-80s.  In the ED he had a chest x-ray which did not show any pulmonary infiltrate but did show free air in the abdomen.  A CTA was done and showed bilateral PEs and confirmed a large amount of free air in the abdomen thought to be due to perforation in the left lower abdomen.  [name] surprisingly denies any significant abdominal pain.  His last bowel movement was about 4 days ago and his wife reports that he is usually impacted, has very painful hard bowel movements.  He has not had any nausea or vomiting. He has been basically bed bound for the last 3 weeks.  Prior to that he was walking with a walker. He has not had any fevers or chills, cough, and no significant pain.   Past Medical History  Diagnosis Date  . Arthritis   . GERD (gastroesophageal reflux disease)   . Complication of anesthesia     aspiration during surgery, difficult to wake up  . Multiple myeloma   . Status post radiation therapy 03/23/12 - 04/05/12    T8 - L4/ 20 Gy/ 10 Fractions  . Dysphagia 05/06/12    ED Visit   Past Surgical History  Procedure Date  . Fracture surgery     ankle  . Hemorrhoid surgery   . Back surgery     47 years ago   Prior to Admission medications   Medication Sig Start Date End Date Taking? Authorizing Provider  amLODipine (NORVASC) 5 MG tablet Take 5 mg by mouth every morning. 05/08/12 05/08/13 Yes [name], MD  baclofen (LIORESAL) 10 MG tablet Take 10 mg by  mouth 3 (three) times daily.   Yes Historical Provider, MD  bisacodyl (BISAC-EVAC) 5 MG EC tablet Take 5-10 mg by mouth at bedtime.   Yes Historical Provider, MD  dexamethasone (DECADRON) 4 MG tablet Take 4 mg by mouth daily with breakfast.  04/07/12  Yes Historical Provider, MD  famciclovir (FAMVIR) 500 MG tablet Take 500 mg by mouth every morning.  04/07/12  Yes Historical Provider, MD  fluconazole (DIFLUCAN) 200 MG tablet Take 200 mg by mouth every morning. 05/06/12 05/13/12 Yes Suzi Roots, MD  methocarbamol (ROBAXIN) 500 MG tablet Take 500 mg by mouth 4 (four) times daily as needed. For pain.   Yes Historical Provider, MD  ondansetron (ZOFRAN) 4 MG tablet Take 4 mg by mouth every 8 (eight) hours as needed. Nausea   Yes Historical Provider, MD  oxyCODONE (OXY IR/ROXICODONE) 5 MG immediate release tablet Take 5 mg by mouth every 4 (four) hours as needed. Breakthrough pain   Yes Historical Provider, MD  oxyCODONE (OXYCONTIN) 20 MG 12 hr tablet Take 1 tablet (20 mg total) by mouth every 12 (twelve) hours. 05/08/12  Yes [name], MD  polyethylene glycol (MIRALAX / GLYCOLAX) packet Take 17 g by mouth daily as needed. For constipation.   Yes Historical Provider, MD  sulfamethoxazole-trimethoprim (  BACTRIM DS) 800-160 MG per tablet Take 1 tablet by mouth 2 (two) times daily.   Yes Historical Provider, MD  zolpidem (AMBIEN) 5 MG tablet Take 5 mg by mouth at bedtime as needed. sleep 04/07/12  Yes Historical Provider, MD   Allergies Allergies  Allergen Reactions  . Penicillins Swelling    Family History Family History  Problem Relation Age of Onset  . Cancer Maternal Grandfather   . Cancer Maternal Grandmother    Social History  reports that he quit smoking about 34 years ago. His smoking use included Cigarettes. He has never used smokeless tobacco. He reports that he drinks alcohol. He reports that he does not use illicit drugs.  Review Of Systems:  Ten systems were reviewed and were  negative other than stated in the HPI  Brief patient description:  71 y/o man with multiple myeloma, bilateral PEs and bowel perforation requiring surgical intervention.   Events Since Admission: Central line placement  Current Status: stable  Vital Signs: Temp:  [99.6 F (37.6 C)] 99.6 F (37.6 C) (10/05 1852) Pulse Rate:  [109-135] 109  (10/05 2331) Resp:  [18-26] 22  (10/05 2331) BP: (114-131)/(87-94) 114/87 mmHg (10/05 2331) SpO2:  [93 %-98 %] 98 % (10/05 2331) Weight:  [97.523 kg (215 lb)] 97.523 kg (215 lb) (10/05 2146)  Physical Examination: General:  Well appearing elderly male, laying in bed in NAD Neuro:  Alert and oriented x3, CN II-XII intact HEENT:  PERRL, EOMI, OP clear Neck:  Supple, no masses Cardiovascular:  NRRR, no mrg Lungs:  CTAB, no wrr Abdomen:  Soft, moderately tender to deep palpation, absent bowel sounds Musculoskeletal:  No joint abnormalitiles, trace bilateral LE edema Skin:  Chronic venous stasis changes on bilateral LE, no rash  Principal Problem:  *Bowel perforation Active Problems:  PE (pulmonary thromboembolism)   ASSESSMENT AND PLAN  PULMONARY No results found for this basename: PHART:5,PCO2:5,PCO2ART:5,PO2ART:5,HCO3:5,O2SAT:5 in the last 168 hours Ventilator Settings:   CXR:  Free air in abdomen ETT:  none  A:  Bilateral PEs shown on CTA P:   - cannot be anticoagulated in the acute setting as patient requires ex-lap for likely perforated bowel. - IVC filter placement in by VIR prior to going to OR - will start heparin when felt safe from a surgical standpoint.  - oxygen by nasal canula to maintain sats greater than 90%  CARDIOVASCULAR  Lab 05/13/12 1821  TROPONINI <0.30  LATICACIDVEN --  PROBNP --   ECG:  NSR Lines: right subclavian triple lumen catheter  A: Currently hemodynamically stable P:  - on chronic dexamethasone as part of chemo regimen - start stress dose steroids  RENAL  Lab 05/13/12 1821 05/13/12 1740  05/10/12 1112  NA 132* 133* 137  K 4.1 5.0 --  CL 97 97 96*  CO2 25 22 29   BUN 16 23 13   CREATININE 0.74 0.50 0.5*  CALCIUM 9.6 8.6 8.5  MG -- -- --  PHOS -- -- --   Intake/Output      10/05 0701 - 10/06 0700   I.V. (mL/kg) 500 (5.1)   Total Intake(mL/kg) 500 (5.1)   Net +500       Urine Occurrence 1 x    Foley:  none  A:  Currently euvolemic P:   - Start NS at 100cc per hour prior to going to OR - follow urine output  GASTROINTESTINAL  Lab 05/13/12 1821 05/10/12 1112  AST 11 43*  ALT 6 --  ALKPHOS 99 113*  BILITOT 0.4 1.10  PROT 6.8 5.9*  ALBUMIN 3.3* --    A:  Bowel perforation likely secondary to severe constipation 2/2 to multiple myeloma and opioid use. 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 Pulmonary and Critical Care Medicine [facility] Pager: [phone]  05/14/2012, 12:37 AM  30 minutes of critical care time was spent in the care of this  patient not including procedures which are documented elsewhere.

## 2012-05-14 ENCOUNTER — Inpatient Hospital Stay (HOSPITAL_COMMUNITY): Payer: BC Managed Care – PPO

## 2012-05-14 ENCOUNTER — Encounter (HOSPITAL_COMMUNITY): Payer: Self-pay | Admitting: Anesthesiology

## 2012-05-14 ENCOUNTER — Encounter (HOSPITAL_COMMUNITY)
Admission: EM | Disposition: A | Discharge status: Skilled Nursing Facility | Payer: Self-pay | Source: Home / Self Care | Attending: Internal Medicine

## 2012-05-14 ENCOUNTER — Encounter (HOSPITAL_COMMUNITY): Payer: Self-pay | Admitting: *Deleted

## 2012-05-14 ENCOUNTER — Encounter (HOSPITAL_COMMUNITY): Payer: Self-pay | Admitting: Surgery

## 2012-05-14 ENCOUNTER — Inpatient Hospital Stay (HOSPITAL_COMMUNITY): Payer: BC Managed Care – PPO | Admitting: Anesthesiology

## 2012-05-14 DIAGNOSIS — K631 Perforation of intestine (nontraumatic): Secondary | ICD-10-CM

## 2012-05-14 DIAGNOSIS — R4182 Altered mental status, unspecified: Secondary | ICD-10-CM

## 2012-05-14 DIAGNOSIS — I2699 Other pulmonary embolism without acute cor pulmonale: Secondary | ICD-10-CM

## 2012-05-14 DIAGNOSIS — J9600 Acute respiratory failure, unspecified whether with hypoxia or hypercapnia: Secondary | ICD-10-CM

## 2012-05-14 DIAGNOSIS — R0902 Hypoxemia: Secondary | ICD-10-CM

## 2012-05-14 DIAGNOSIS — I10 Essential (primary) hypertension: Secondary | ICD-10-CM

## 2012-05-14 DIAGNOSIS — K5732 Diverticulitis of large intestine without perforation or abscess without bleeding: Secondary | ICD-10-CM

## 2012-05-14 HISTORY — PX: COLOSTOMY: SHX63

## 2012-05-14 HISTORY — PX: LAPAROTOMY: SHX154

## 2012-05-14 LAB — GLUCOSE, CAPILLARY
Glucose-Capillary: 185 mg/dL — ABNORMAL HIGH (ref 70–99)
Glucose-Capillary: 165 mg/dL — ABNORMAL HIGH (ref 70–99)
Glucose-Capillary: 281 mg/dL — ABNORMAL HIGH (ref 70–99)
Glucose-Capillary: 206 mg/dL — ABNORMAL HIGH (ref 70–99)

## 2012-05-14 LAB — BASIC METABOLIC PANEL
CO2: 24 mEq/L (ref 19–32)
Calcium: 8 mg/dL — ABNORMAL LOW (ref 8.4–10.5)
Creatinine, Ser: 0.38 mg/dL — ABNORMAL LOW (ref 0.50–1.35)
GFR calc Af Amer: >90 mL/min (ref >90)

## 2012-05-14 LAB — BLOOD GAS, ARTERIAL
Acid-base deficit: 3.5 mmol/L — ABNORMAL HIGH (ref 0.0–2.0)
Drawn by: 340271
FIO2: 1 %
PEEP: 5 cmH2O
RATE: 14 resp/min
pCO2 arterial: 36.9 mmHg (ref 35.0–45.0)
pH, Arterial: 7.369 (ref 7.350–7.450)
pO2, Arterial: 195 mmHg — ABNORMAL HIGH (ref 80.0–100.0)

## 2012-05-14 LAB — CBC
MCH: 32.1 pg (ref 26.0–34.0)
MCV: 94.1 fL (ref 78.0–100.0)
Platelets: 111 10*3/uL — ABNORMAL LOW (ref 150–400)
RDW: 17 % — ABNORMAL HIGH (ref 11.5–15.5)

## 2012-05-14 LAB — APTT: aPTT: 28 seconds (ref 24–37)

## 2012-05-14 LAB — MAGNESIUM: Magnesium: 2.4 mg/dL (ref 1.5–2.5)

## 2012-05-14 LAB — HEPARIN LEVEL (UNFRACTIONATED): Heparin Unfractionated: 0.43 IU/mL (ref 0.30–0.70)

## 2012-05-14 SURGERY — LAPAROTOMY, EXPLORATORY
Anesthesia: General | Site: Abdomen | Wound class: Dirty or Infected

## 2012-05-14 MED ORDER — FENTANYL BOLUS VIA INFUSION
50.0000 ug | Freq: Four times a day (QID) | INTRAVENOUS | Status: DC | PRN
  Administered 2012-05-16 – 2012-05-17 (×2): 100 ug via INTRAVENOUS
  Filled 2012-05-14: qty 100

## 2012-05-14 MED ORDER — FENTANYL CITRATE 0.05 MG/ML IJ SOLN
INTRAMUSCULAR | Status: AC
  Filled 2012-05-14: qty 2

## 2012-05-14 MED ORDER — IOHEXOL 300 MG/ML  SOLN
100.0000 mL | Freq: Once | INTRAMUSCULAR | Status: AC | PRN
  Administered 2012-05-14: 35 mL via INTRAVENOUS

## 2012-05-14 MED ORDER — KCL IN DEXTROSE-NACL 20-5-0.45 MEQ/L-%-% IV SOLN
INTRAVENOUS | Status: AC
  Filled 2012-05-14: qty 1000

## 2012-05-14 MED ORDER — HYDROMORPHONE HCL PF 1 MG/ML IJ SOLN: 0.2500 mg | INTRAMUSCULAR | Status: DC | PRN

## 2012-05-14 MED ORDER — PANTOPRAZOLE SODIUM 40 MG IV SOLR
40.0000 mg | INTRAVENOUS | Status: DC
  Administered 2012-05-14 – 2012-05-15 (×2): 40 mg via INTRAVENOUS
  Filled 2012-05-14 (×3): qty 40

## 2012-05-14 MED ORDER — SODIUM CHLORIDE 0.9 % IR SOLN
Status: DC | PRN
  Administered 2012-05-14: 3000 mL

## 2012-05-14 MED ORDER — NALOXONE HCL 0.4 MG/ML IJ SOLN: 0.4000 mg | INTRAMUSCULAR | Status: DC | PRN

## 2012-05-14 MED ORDER — MIDAZOLAM HCL 5 MG/5ML IJ SOLN
INTRAMUSCULAR | Status: DC | PRN
  Administered 2012-05-14: 2 mg via INTRAVENOUS

## 2012-05-14 MED ORDER — MIDAZOLAM HCL 2 MG/2ML IJ SOLN
INTRAMUSCULAR | Status: AC
  Filled 2012-05-14: qty 2

## 2012-05-14 MED ORDER — NEOSTIGMINE METHYLSULFATE 1 MG/ML IJ SOLN
INTRAMUSCULAR | Status: DC | PRN
  Administered 2012-05-14: 5 mg via INTRAVENOUS

## 2012-05-14 MED ORDER — SODIUM CHLORIDE 0.9 % IV SOLN
50.0000 ug/h | INTRAVENOUS | Status: DC
  Administered 2012-05-14: 75 ug/h via INTRAVENOUS
  Administered 2012-05-15: 110 ug/h via INTRAVENOUS
  Administered 2012-05-16 – 2012-05-17 (×2): 100 ug/h via INTRAVENOUS
  Filled 2012-05-14 (×4): qty 50

## 2012-05-14 MED ORDER — DIPHENHYDRAMINE HCL 12.5 MG/5ML PO ELIX: 12.5000 mg | ORAL_SOLUTION | Freq: Four times a day (QID) | ORAL | Status: DC | PRN

## 2012-05-14 MED ORDER — VANCOMYCIN HCL IN DEXTROSE 1-5 GM/200ML-% IV SOLN
1000.0000 mg | Freq: Three times a day (TID) | INTRAVENOUS | Status: DC
  Administered 2012-05-14 – 2012-05-15 (×3): 1000 mg via INTRAVENOUS
  Filled 2012-05-14 (×5): qty 200

## 2012-05-14 MED ORDER — FENTANYL CITRATE 0.05 MG/ML IJ SOLN
INTRAMUSCULAR | Status: DC | PRN
  Administered 2012-05-14 (×2): 100 ug via INTRAVENOUS
  Administered 2012-05-14: 50 ug via INTRAVENOUS

## 2012-05-14 MED ORDER — SODIUM CHLORIDE 0.9 % IV SOLN
2.0000 mg/h | INTRAVENOUS | Status: DC
  Administered 2012-05-14 (×2): 3 mg/h via INTRAVENOUS
  Administered 2012-05-15 – 2012-05-16 (×2): 2 mg/h via INTRAVENOUS
  Filled 2012-05-14 (×5): qty 10

## 2012-05-14 MED ORDER — LACTATED RINGERS IV SOLN
INTRAVENOUS | Status: DC | PRN
  Administered 2012-05-14 (×2): via INTRAVENOUS

## 2012-05-14 MED ORDER — ACETAMINOPHEN 10 MG/ML IV SOLN
INTRAVENOUS | Status: DC | PRN
  Administered 2012-05-14: 1000 mg via INTRAVENOUS

## 2012-05-14 MED ORDER — PROMETHAZINE HCL 25 MG/ML IJ SOLN: 6.2500 mg | INTRAMUSCULAR | Status: DC | PRN

## 2012-05-14 MED ORDER — HYDROMORPHONE HCL PF 1 MG/ML IJ SOLN
INTRAMUSCULAR | Status: DC | PRN
  Administered 2012-05-14: 1 mg via INTRAVENOUS

## 2012-05-14 MED ORDER — GLYCOPYRROLATE 0.2 MG/ML IJ SOLN
INTRAMUSCULAR | Status: DC | PRN
  Administered 2012-05-14: 0.6 mg via INTRAVENOUS

## 2012-05-14 MED ORDER — CHLORHEXIDINE GLUCONATE 0.12 % MT SOLN
15.0000 mL | Freq: Two times a day (BID) | OROMUCOSAL | Status: DC
  Administered 2012-05-14 – 2012-05-17 (×8): 15 mL via OROMUCOSAL
  Filled 2012-05-14 (×6): qty 15

## 2012-05-14 MED ORDER — PROPOFOL 10 MG/ML IV BOLUS
INTRAVENOUS | Status: DC | PRN
  Administered 2012-05-14: 70 mg via INTRAVENOUS
  Administered 2012-05-14: 50 mg via INTRAVENOUS

## 2012-05-14 MED ORDER — MIDAZOLAM BOLUS VIA INFUSION
1.0000 mg | INTRAVENOUS | Status: DC | PRN
  Administered 2012-05-16: 2 mg via INTRAVENOUS
  Administered 2012-05-17: 1 mg via INTRAVENOUS
  Filled 2012-05-14: qty 2

## 2012-05-14 MED ORDER — HYDROMORPHONE 0.3 MG/ML IV SOLN: INTRAVENOUS | Status: DC

## 2012-05-14 MED ORDER — ROCURONIUM BROMIDE 100 MG/10ML IV SOLN
INTRAVENOUS | Status: DC | PRN
  Administered 2012-05-14: 25 mg via INTRAVENOUS

## 2012-05-14 MED ORDER — KCL IN DEXTROSE-NACL 20-5-0.45 MEQ/L-%-% IV SOLN
INTRAVENOUS | Status: DC
  Administered 2012-05-14: 11:00:00 via INTRAVENOUS
  Filled 2012-05-14 (×4): qty 1000

## 2012-05-14 MED ORDER — ACETAMINOPHEN 10 MG/ML IV SOLN
INTRAVENOUS | Status: AC
  Filled 2012-05-14: qty 100

## 2012-05-14 MED ORDER — DIPHENHYDRAMINE HCL 50 MG/ML IJ SOLN: 12.5000 mg | Freq: Four times a day (QID) | INTRAMUSCULAR | Status: DC | PRN

## 2012-05-14 MED ORDER — BIOTENE DRY MOUTH MT LIQD
15.0000 mL | Freq: Four times a day (QID) | OROMUCOSAL | Status: DC
  Administered 2012-05-14 – 2012-05-15 (×6): 15 mL via OROMUCOSAL

## 2012-05-14 MED ORDER — HEPARIN (PORCINE) IN NACL 100-0.45 UNIT/ML-% IJ SOLN
1500.0000 [IU]/h | INTRAMUSCULAR | Status: DC
  Administered 2012-05-14 – 2012-05-15 (×2): 1500 [IU]/h via INTRAVENOUS
  Filled 2012-05-14 (×3): qty 250

## 2012-05-14 MED ORDER — SODIUM CHLORIDE 0.9 % IJ SOLN: 9.0000 mL | INTRAMUSCULAR | Status: DC | PRN

## 2012-05-14 MED ORDER — SUCCINYLCHOLINE CHLORIDE 20 MG/ML IJ SOLN
INTRAMUSCULAR | Status: DC | PRN
  Administered 2012-05-14: 60 mg via INTRAVENOUS
  Administered 2012-05-14: 140 mg via INTRAVENOUS

## 2012-05-14 MED ORDER — ONDANSETRON HCL 4 MG/2ML IJ SOLN
INTRAMUSCULAR | Status: DC | PRN
  Administered 2012-05-14: 4 mg via INTRAVENOUS

## 2012-05-14 MED ORDER — HYDROMORPHONE HCL 1 MG/ML PO LIQD
1.0000 mg | Freq: Four times a day (QID) | ORAL | Status: DC | PRN
  Filled 2012-05-14: qty 1

## 2012-05-14 MED ORDER — ONDANSETRON HCL 4 MG/2ML IJ SOLN: 4.0000 mg | Freq: Four times a day (QID) | INTRAMUSCULAR | Status: DC | PRN

## 2012-05-14 MED ORDER — ETOMIDATE 2 MG/ML IV SOLN
INTRAVENOUS | Status: DC | PRN
  Administered 2012-05-14: 10 mg via INTRAVENOUS

## 2012-05-14 SURGICAL SUPPLY — 43 items
APPLICATOR COTTON TIP 6IN STRL (MISCELLANEOUS) ×2 IMPLANT
BLADE EXTENDED COATED 6.5IN (ELECTRODE) ×4 IMPLANT
BLADE HEX COATED 2.75 (ELECTRODE) ×2 IMPLANT
CANISTER SUCTION 2500CC (MISCELLANEOUS) ×2 IMPLANT
CLOTH BEACON ORANGE TIMEOUT ST (SAFETY) ×2 IMPLANT
COVER MAYO STAND STRL (DRAPES) ×2 IMPLANT
DRAPE LAPAROSCOPIC ABDOMINAL (DRAPES) ×2 IMPLANT
DRAPE WARM FLUID 44X44 (DRAPE) ×2 IMPLANT
DRSG PAD ABDOMINAL 8X10 ST (GAUZE/BANDAGES/DRESSINGS) ×2 IMPLANT
ELECT BLADE TIP CTD 4 INCH (ELECTRODE) ×2 IMPLANT
ELECT REM PT RETURN 9FT ADLT (ELECTROSURGICAL) ×2
ELECTRODE REM PT RTRN 9FT ADLT (ELECTROSURGICAL) ×1 IMPLANT
GLOVE BIOGEL PI IND STRL 7.0 (GLOVE) ×1 IMPLANT
GLOVE BIOGEL PI INDICATOR 7.0 (GLOVE) ×1
GLOVE SURG ORTHO 8.0 STRL STRW (GLOVE) ×2 IMPLANT
GOWN STRL NON-REIN LRG LVL3 (GOWN DISPOSABLE) ×2 IMPLANT
GOWN STRL REIN XL XLG (GOWN DISPOSABLE) ×6 IMPLANT
KIT BASIN OR (CUSTOM PROCEDURE TRAY) ×2 IMPLANT
NS IRRIG 1000ML POUR BTL (IV SOLUTION) ×2 IMPLANT
PACK GENERAL/GYN (CUSTOM PROCEDURE TRAY) ×2 IMPLANT
RELOAD PROXIMATE 75MM BLUE (ENDOMECHANICALS) ×2 IMPLANT
SPONGE GAUZE 4X4 12PLY (GAUZE/BANDAGES/DRESSINGS) ×2 IMPLANT
SPONGE LAP 18X18 X RAY DECT (DISPOSABLE) ×8 IMPLANT
STAPLER PROXIMATE 75MM BLUE (STAPLE) ×2 IMPLANT
STAPLER VISISTAT 35W (STAPLE) ×2 IMPLANT
SUCTION POOLE TIP (SUCTIONS) ×2 IMPLANT
SUT NOV 1 T60/GS (SUTURE) ×2 IMPLANT
SUT NOVA NAB DX-16 0-1 5-0 T12 (SUTURE) ×8 IMPLANT
SUT PROLENE 2 0 CT2 30 (SUTURE) ×2 IMPLANT
SUT SILK 2 0 (SUTURE) ×1
SUT SILK 2 0 SH CR/8 (SUTURE) ×2 IMPLANT
SUT SILK 2-0 18XBRD TIE 12 (SUTURE) ×1 IMPLANT
SUT SILK 3 0 (SUTURE) ×1
SUT SILK 3 0 SH CR/8 (SUTURE) ×2 IMPLANT
SUT SILK 3-0 18XBRD TIE 12 (SUTURE) ×1 IMPLANT
SUT VIC AB 3-0 SH 8-18 (SUTURE) ×2 IMPLANT
SUT VICRYL 2 0 18  UND BR (SUTURE)
SUT VICRYL 2 0 18 UND BR (SUTURE) IMPLANT
TAPE CLOTH SURG 4X10 WHT LF (GAUZE/BANDAGES/DRESSINGS) ×2 IMPLANT
TOWEL OR 17X26 10 PK STRL BLUE (TOWEL DISPOSABLE) ×4 IMPLANT
TRAY FOLEY CATH 14FRSI W/METER (CATHETERS) ×2 IMPLANT
WATER STERILE IRR 1500ML POUR (IV SOLUTION) ×2 IMPLANT
YANKAUER SUCT BULB TIP NO VENT (SUCTIONS) ×2 IMPLANT

## 2012-05-14 NOTE — Progress Notes (Signed)
ANTICOAGULATION CONSULT NOTE - Initial Consult  Pharmacy Consult for Heparin Indication: pulmonary embolus/DVT  Allergies  Allergen Reactions  . Penicillins Swelling    Patient Measurements: Height: 5\' 10"  (177.8 cm) Weight: 212 lb 15.4 oz (96.6 kg) IBW/kg (Calculated) : 73  Heparin Dosing Weight: 92.8  Vital Signs: Temp: 97.7 F (36.5 C) (10/06 1000) Temp src: Axillary (10/06 1000) BP: 124/87 mmHg (10/06 1200) Pulse Rate: 106  (10/06 1200)  Labs:  Basename 05/14/12 1131 05/14/12 0530 05/13/12 1821 05/13/12 1740  HGB -- 10.3* -- 11.5*  HCT -- 30.2* -- 33.8*  PLT -- 111* -- 128*  APTT 28 -- -- --  LABPROT -- -- 15.3* --  INR -- -- 1.23 --  HEPARINUNFRC -- -- -- --  CREATININE -- 0.38* 0.74 0.50  CKTOTAL -- -- -- --  CKMB -- -- -- --  TROPONINI -- -- <0.30 --    Estimated Creatinine Clearance: 98.7 ml/min (by C-G formula based on Cr of 0.38).   Medical History: Past Medical History  Diagnosis Date  . Arthritis   . GERD (gastroesophageal reflux disease)   . Complication of anesthesia     aspiration during surgery, difficult to wake up  . Multiple myeloma   . Status post radiation therapy 03/23/12 - 04/05/12    T8 - L4/ 20 Gy/ 10 Fractions  . Dysphagia 05/06/12    ED Visit    Medications:  Scheduled:    . sodium chloride  1,000 mL Intravenous Once   Followed by  . sodium chloride  1,000 mL Intravenous Once  . dextrose 5 % and 0.45 % NaCl with KCl 20 mEq/L      . fentaNYL      . hydrocortisone sod succinate (SOLU-CORTEF) injection  100 mg Intravenous Q8H  . insulin aspart  0-9 Units Subcutaneous Q4H  . meropenem (MERREM) IV  1 g Intravenous Q8H  . midazolam      .  morphine injection  2 mg Intravenous Once  . morphine      . sodium chloride  500 mL Intravenous Once  . vancomycin  1,000 mg Intravenous Q8H  . DISCONTD: HYDROmorphone PCA 0.3 mg/mL   Intravenous Q4H  . DISCONTD: insulin regular  2-8 Units Subcutaneous Q6H  . DISCONTD: levofloxacin  (LEVAQUIN) IV  750 mg Intravenous Q24H  . DISCONTD: metronidazole  500 mg Intravenous Q8H   Infusions:    . sodium chloride    . dextrose 5 % and 0.45 % NaCl with KCl 20 mEq/L 125 mL/hr at 05/14/12 1035  . fentaNYL infusion INTRAVENOUS 100 mcg/hr (05/14/12 1200)  . midazolam (VERSED) infusion 3 mg/hr (05/14/12 1200)  . DISCONTD: sodium chloride Stopped (05/14/12 1038)   PRN: sodium chloride, fentaNYL, iohexol, iohexol, midazolam, DISCONTD: diphenhydrAMINE, DISCONTD: diphenhydrAMINE, DISCONTD: fentaNYL, DISCONTD:  HYDROmorphone (DILAUDID) injection, DISCONTD:  HYDROmorphone (DILAUDID) injection, DISCONTD: HYDROmorphone HCl, DISCONTD: naloxone, DISCONTD: ondansetron (ZOFRAN) IV, DISCONTD: promethazine, DISCONTD: promethazine, DISCONTD: sodium chloride, DISCONTD: sodium chloride irrigation  Assessment:  71 yo M with bilateral PE's on CTA   IVC filter placed by IR  DVT in common femoral vein and posterior tibial veins per vascular lab Goal of Therapy:  Heparin level 0.3-0.7 units/ml Monitor platelets by anticoagulation protocol: Yes   Plan:  1) Start heparin infusion at 1500 units/hr at 5pm today as requested since patient will post op 6 hr at that time. No Load requested. 2) Heparin level for 11pm today 3) Daily heparin level starting 10/7 0500 along with CBC   Shon Mansouri,  Joline Maxcy 05/14/2012,12:44 PM

## 2012-05-14 NOTE — Transfer of Care (Signed)
Immediate Anesthesia Transfer of Care Note  Patient: Robert Berger  Procedure(s) Performed: Procedure(s) (LRB) with comments: EXPLORATORY LAPAROTOMY (N/A) LAPAROSCOPIC SIGMOID COLECTOMY (N/A) COLOSTOMY (N/A)  Patient Location: PACU and ICU  Anesthesia Type: General  Level of Consciousness: sedated and unresponsive  Airway & Oxygen Therapy: Patient remains intubated per anesthesia plan  Post-op Assessment: Report given to PACU RN and Post -op Vital signs reviewed and stable  Post vital signs: Reviewed and stable  Complications: No apparent anesthesia complications

## 2012-05-14 NOTE — Op Note (Signed)
NAMEMarland Kitchen  Berger, Robert NO.:  [id number]  MEDICAL RECORD NO.:  [id number]  LOCATION:  1227                         FACILITY:  [facility]  PHYSICIAN:  Velora Heckler, MD      DATE OF BIRTH:  1941-02-11  DATE OF PROCEDURE:  05/14/2012                               OPERATIVE REPORT   PREOPERATIVE DIAGNOSIS:  Perforated viscus.  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  PROCEDURE: 1. Exploratory laparotomy. 2. Sigmoid colectomy. 3. Descending colostomy with Hartmann's pouch.  SURGEON:  Velora Heckler, MD, FACS  ASSISTANT:  Ardeth Sportsman, MD, FACS  ANESTHESIA:  General per Dr. Quentin Cornwall. Denenny.  ESTIMATED BLOOD LOSS:  Minimal.  PREPARATION:  ChloraPrep.  COMPLICATIONS:  None.  INDICATIONS:  The patient is a 71 year old white male, who presented to the emergency department with complaints of shortness of breath. Evaluation included CT scans of the chest, abdomen, and pelvis, which demonstrated bilateral pulmonary emboli and a large amount of free intraperitoneal air.  Inflammatory changes were noted in the left lower quadrant.  The patient underwent placement of a inferior vena cava filter preoperatively.  He was then brought to the operating room at this time for exploration for perforated viscus.  BODY OF REPORT:  Procedure was done in OR #1 at the [facility].  The patient was brought to the operating room, placed in supine position on the operating room table.  Following administration of general anesthesia, the patient was positioned and then prepped and draped in the usual strict aseptic fashion.  After ascertaining that an adequate level of anesthesia had been achieved, a midline abdominal incision was made.  Peritoneal cavity was entered cautiously.  There was a small amount of cloudy ascitic fluid within the peritoneal cavity.  There was evidence of pneumatosis along the  sigmoid colon.  There was free air present within the peritoneal cavity. Exploration reveals an inflammatory process in the left lower quadrant. Two loops of small bowel are adherent to the sigmoid colon.  These were gently mobilized with blunt dissection.  They have an inflammatory exudate on their surface but this appears to be secondary involvement of the small bowel.  There was a indurated hard firm mass in the mid sigmoid colon.  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  No signs of metastatic disease were identified.  Incision was lengthened and a Balfour retractor was placed.  Sigmoid colon was mobilized from its lateral peritoneal attachments using the electrocautery for hemostasis.  A point proximal to the inflammatory mass was selected and transected with a GIA stapler.  Mesentery was divided with the ligature.  Dissection was carried distally to just above the pelvic peritoneal reflection.  At this point, the bowel was transected with a GIA stapler and the specimen was passed off the field and submitted to Pathology for review.  Good hemostasis was achieved.  Pelvis and lower abdomen were irrigated copiously with warm saline.  Descending colon was mobilized along its lateral peritoneal attachments to allow for enough length to bring out a descending colostomy on the left mid abdominal wall.  Abdomen was copiously  irrigated with warm saline which was evacuated. Small bowel was run from the ligament of Treitz to the ileocecal valve. Cecum appears normal and full of stool.  Appendix was grossly normal. The remainder of the small bowel was normal except for the 2 areas which were involved in the inflammatory process which appeared to be intact and secondarily involved with inflammation.  Nasogastric tube was positioned within the stomach.  All fluid was evacuated.  An elliptical incision was made in the left mid  abdominal wall with a #10 blade.  A plug of adipose tissue was excised with the electrocautery.  The fascia was incised in a cruciate fashion and the peritoneal cavity was entered cautiously.  Using a Babcock clamp, the descending colon was delivered through the ostomy site in the left mid abdominal wall.  There was no tension.  Omentum was used to cover the small bowel.  Midline incision was closed with interrupted #1 Novafil simple sutures.  Subcutaneous tissues were irrigated.  Skin was closed with stainless steel staples.  Colostomy was matured by excising excess adipose tissue.  Staple line was excised.  The edges of the colon were matured to the skin edges circumferentially with interrupted 3-0 Vicryl simple sutures.  Colostomy appliance and sterile dressings were applied to the abdominal wounds.  The patient was awakened from anesthesia by Dr. Council Mechanic and prepared for transfer back to the intensive care unit.  The patient tolerated the procedure well.  Velora Heckler, MD, [facility] Office: [phone]    TMG/MEDQ  D:  05/14/2012  T:  05/14/2012  Job:  [id number]  cc:   [name], M.D. Fax: [phone]

## 2012-05-14 NOTE — Progress Notes (Signed)
Name: Robert Berger MRN: [id number] DOB: 07-03-1941    LOS: 1  Referring Provider:  Dr. Rosalia Hammers Reason for Referral:  PE, bowel perforation  PULMONARY / CRITICAL CARE MEDICINE  HPI:  Robert Berger is a very pleasant 71 y/o man with a past medical history significant for multiple myeloma with multiple bone lytic lesions currently being treated with velcade and zometa.  He presented to the [facility] ED on the evening of 10/5 with shortness of breath.  He has a pulse oximeter at home and noted that his saturations were in the mid-80s.  In the ED he had a chest x-ray which did not show any pulmonary infiltrate but did show free air in the abdomen.  A CTA was done and showed bilateral PEs and confirmed a large amount of free air in the abdomen thought to be due to perforation in the left lower abdomen.  Robert Berger surprisingly denies any significant abdominal pain.  His last bowel movement was about 4 days ago and his wife reports that he is usually impacted, has very painful hard bowel movements.  He has not had any nausea or vomiting. He has been basically bed bound for the last 3 weeks.  Prior to that he was walking with a walker. He has not had any fevers or chills, cough, and no significant pain.   Vital Signs: Temp:  [97.5 F (36.4 C)-99.6 F (37.6 C)] 98.8 F (37.1 C) (10/06 0400) Pulse Rate:  [94-135] 133  (10/06 0600) Resp:  [11-26] 20  (10/06 0600) BP: (114-131)/(78-98) 118/85 mmHg (10/06 0600) SpO2:  [93 %-98 %] 96 % (10/06 0600) FiO2 (%):  [100 %] 100 % (10/06 1000) Weight:  [96.6 kg (212 lb 15.4 oz)-97.523 kg (215 lb)] 96.6 kg (212 lb 15.4 oz) (10/06 0500)  Physical Examination: General:  Well appearing elderly male, laying in bed in NAD Neuro:  Alert and oriented x3, CN II-XII intact HEENT:  PERRL, EOMI, OP clear Neck:  Supple, no masses Cardiovascular:  NRRR, no mrg Lungs:  CTAB, no wrr Abdomen:  Soft, moderately tender to deep palpation, absent bowel sounds Musculoskeletal:   No joint abnormalitiles, trace bilateral LE edema Skin:  Chronic venous stasis changes on bilateral LE, no rash  Principal Problem:  *Bowel perforation Active Problems:  PE (pulmonary thromboembolism)  ASSESSMENT AND PLAN  PULMONARY No results found for this basename: PHART:5,PCO2:5,PCO2ART:5,PO2ART:5,HCO3:5,O2SAT:5 in the last 168 hours Ventilator Settings: Vent Mode:  [-] PRVC FiO2 (%):  [100 %] 100 % Set Rate:  [14 bmp] 14 bmp Vt Set:  [600 mL] 600 mL PEEP:  [5 cmH20] 5 cmH20 CXR:  Free air in abdomen ETT:  none  A:  Bilateral PEs shown on CTA P:   - Can anticoagulate 6 hours after surgery with no bolus. - IVC filter placed by IR. - Full vent support for today.  - SBT in AM.  CARDIOVASCULAR  Lab 05/13/12 1821  TROPONINI <0.30  LATICACIDVEN --  PROBNP --   ECG:  NSR Lines: right subclavian triple lumen catheter  A: Currently hemodynamically stable P:  - On chronic dexamethasone as part of chemo regimen. - Continue stress dose steroids. - Will need to restart beta blockers once ok to use GI tract.  RENAL  Lab 05/14/12 0530 05/13/12 1821 05/13/12 1740 05/10/12 1112  NA 135 132* 133* 137  K 4.2 4.1 -- --  CL 101 97 97 96*  CO2 24 25 22 29   BUN 16 16 23 13   CREATININE 0.38* 0.74  0.50 0.5*  CALCIUM 8.0* 9.6 8.6 8.5  MG 2.4 -- -- --  PHOS 3.4 -- -- --   Intake/Output      10/05 0701 - 10/06 0700 10/06 0701 - 10/07 0700   I.V. (mL/kg) 900 (9.3) 1300 (13.5)   IV Piggyback 100    Total Intake(mL/kg) 1000 (10.4) 1300 (13.5)   Urine (mL/kg/hr) 575 (0.2) 400   Blood  25   Total Output 575 425   Net +425 +875        Urine Occurrence 1 x      Intake/Output Summary (Last 24 hours) at 05/14/12 1037 Last data filed at 05/14/12 1008  Gross per 24 hour  Intake   2300 ml  Output   1000 ml  Net   1300 ml   Foley:  none  A:  Currently euvolemic P:   - Decrease NS to 50 ml/hr. - Follow urine output and BMET  GASTROINTESTINAL  Lab 05/13/12 1821 05/10/12  1112  AST 11 43*  ALT 6 --  ALKPHOS 99 113*  BILITOT 0.4 1.10  PROT 6.8 5.9*  ALBUMIN 3.3* --   A:  Bowel perforation likely secondary to severe constipation 2/2 to multiple myeloma and opioid use. P:   - NPO - Post op for perf - Meropenem for enteric pathogens - If not ok to use GI tract over the next 48-72 hours per surgery will need to consider TPN.  HEMATOLOGIC  Lab 05/14/12 0530 05/13/12 1821 05/13/12 1740 05/10/12 1016  HGB 10.3* -- 11.5* 10.6*  HCT 30.2* -- 33.8* 30.8*  PLT 111* -- 128* 98*  INR -- 1.23 -- --  APTT -- -- -- --   A:  Anemia and thrombocytopenia P:  - Likely secondary to myeloma - Monitor CBC daily  INFECTIOUS  Lab 05/14/12 0530 05/13/12 1740 05/10/12 1016  WBC 3.4* 5.8 4.8  PROCALCITON -- -- --   Cultures: Blood  Antibiotics: Meropenem, given levaquin and and flagyl in ED  A:  Bowel perforation P:   - Continue abx   ENDOCRINE  Lab 05/14/12 0729 05/14/12 0448 05/14/12 0103 05/13/12 2236 05/13/12 2148  GLUCAP 165* 185* 181* 102* 234*   A:  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  CC time 35 min.  Alyson Reedy, M.D. [facility] Pulmonary/Critical Care Medicine. Pager: [phone]. After hours pager: [phone].

## 2012-05-14 NOTE — Anesthesia Preprocedure Evaluation (Addendum)
Anesthesia Evaluation  Patient identified by MRN, date of birth, ID band Patient awake  General Assessment Comment:Multiple Myeloma with adrenal suppression secondary to steroids.  Reviewed: Allergy & Precautions, H&P , NPO status , Patient's Chart, lab work & pertinent test results  Airway Mallampati: II TM Distance: >3 FB Neck ROM: Full    Dental  (+) Poor Dentition and Missing   Pulmonary PE Current bilateral PE, s/p IVC filter. breath sounds clear to auscultation  Pulmonary exam normal       Cardiovascular hypertension, Pt. on medications + CAD Rhythm:Regular Rate:Normal  Denies recent cardiac symptoms.   Neuro/Psych  Neuromuscular disease negative psych ROS   GI/Hepatic Neg liver ROS, hiatal hernia, GERD-  ,  Endo/Other  diabetes, Type 2  Renal/GU negative Renal ROS  negative genitourinary   Musculoskeletal negative musculoskeletal ROS (+)   Abdominal (+) + obese,   Peds negative pediatric ROS (+)  Hematology negative hematology ROS (+)   Anesthesia Other Findings   Reproductive/Obstetrics negative OB ROS                           Anesthesia Physical Anesthesia Plan  ASA: III and Emergent  Anesthesia Plan: General   Post-op Pain Management:    Induction: Intravenous  Airway Management Planned: Oral ETT  Additional Equipment:   Intra-op Plan:   Post-operative Plan: Possible Post-op intubation/ventilation  Informed Consent: I have reviewed the patients History and Physical, chart, labs and discussed the procedure including the risks, benefits and alternatives for the proposed anesthesia with the patient or authorized representative who has indicated his/her understanding and acceptance.   Dental advisory given  Plan Discussed with: CRNA  Anesthesia Plan Comments: (HR 130's. BP stable. Shortness of breath and bilateral PE's. Discussed possible postop ventilation with  patient.)       Anesthesia Quick Evaluation

## 2012-05-14 NOTE — Consult Note (Signed)
General Surgery [facility] - Attending  Reason for Consult: free intraperitoneal air, perforated viscus  Referring Physician:  Dr. Margarita Grizzle, [facility]  Robert Berger is an 71 y.o. male.   HPI: the patient is a 71 year old white male who presented to the emergency department with a chief complaint of shortness of breath. Evaluation included radiographic studies which demonstrated free intraperitoneal air. Source of perforation was not clear but appears to be distal small bowel or colon. There is a small amount of fluid in the pelvis. There is a large amount of free intraperitoneal air.  Patient is currently undergoing treatment for multiple myeloma. He has been on chemotherapy. He has had problems with chronic constipation since beginning chemotherapy. He gives no other history of gastrointestinal disease. He has had no prior abdominal surgery.  During workup in the emergency department a CT scan of the chest shows evidence of bilateral pulmonary emboli. Patient was seen in consultation by general surgery and was also evaluated in the emergency department by critical care medicine. He is now admitted to the critical care medicine service. We have discussed management. While this patient certainly requires exploratory laparotomy in an urgent fashion, he cannot be anticoagulated at this point. Therefore we have requested that interventional radiology place a vena caval filter prior to surgery.  Past Medical History  Diagnosis Date  . Arthritis   . GERD (gastroesophageal reflux disease)   . Complication of anesthesia     aspiration during surgery, difficult to wake up  . Multiple myeloma   . Status post radiation therapy 03/23/12 - 04/05/12    T8 - L4/ 20 Gy/ 10 Fractions  . Dysphagia 05/06/12    ED Visit    Past Surgical History  Procedure Date  . Fracture surgery     ankle  . Hemorrhoid surgery   . Back surgery     47 years ago    Family History  Problem  Relation Age of Onset  . Cancer Maternal Grandfather   . Cancer Maternal Grandmother     Social History:  reports that he quit smoking about 34 years ago. His smoking use included Cigarettes. He has never used smokeless tobacco. He reports that he drinks alcohol. He reports that he does not use illicit drugs.  Allergies:  Allergies  Allergen Reactions  . Penicillins Swelling    Medications: I have reviewed the patient's current medications.  Results for orders placed during the hospital encounter of 05/13/12 (from the past 48 hour(s))  BASIC METABOLIC PANEL     Status: Abnormal   Collection Time   05/13/12  5:40 PM      Component Value Range Comment   Sodium 133 (*) 135 - 145 mEq/L    Potassium 5.0  3.5 - 5.1 mEq/L    Chloride 97  96 - 112 mEq/L    CO2 22  19 - 32 mEq/L    Glucose, Bld 292 (*) 70 - 99 mg/dL    BUN 23  6 - 23 mg/dL    Creatinine, Ser 1.61  0.50 - 1.35 mg/dL    Calcium 8.6  8.4 - 09.6 mg/dL    GFR calc non Af Amer >90  >90 mL/min    GFR calc Af Amer >90  >90 mL/min   CBC     Status: Abnormal   Collection Time   05/13/12  5:40 PM      Component Value Range Comment   WBC 5.8  4.0 - 10.5  K/uL    RBC 3.61 (*) 4.22 - 5.81 MIL/uL    Hemoglobin 11.5 (*) 13.0 - 17.0 g/dL    HCT 16.1 (*) 09.6 - 52.0 %    MCV 93.6  78.0 - 100.0 fL    MCH 31.9  26.0 - 34.0 pg    MCHC 34.0  30.0 - 36.0 g/dL    RDW 04.5 (*) 40.9 - 15.5 %    Platelets 128 (*) 150 - 400 K/uL   DIFFERENTIAL     Status: Abnormal   Collection Time   05/13/12  5:40 PM      Component Value Range Comment   Neutrophils Relative 89 (*) 43 - 77 %    Neutro Abs 5.1  1.7 - 7.7 K/uL    Lymphocytes Relative 8 (*) 12 - 46 %    Lymphs Abs 0.4 (*) 0.7 - 4.0 K/uL    Monocytes Relative 4  3 - 12 %    Monocytes Absolute 0.2  0.1 - 1.0 K/uL    Eosinophils Relative 0  0 - 5 %    Eosinophils Absolute 0.0  0.0 - 0.7 K/uL    Basophils Relative 0  0 - 1 %    Basophils Absolute 0.0  0.0 - 0.1 K/uL   POCT I-STAT TROPONIN I      Status: Normal   Collection Time   05/13/12  5:59 PM      Component Value Range Comment   Troponin i, poc 0.01  0.00 - 0.08 ng/mL    Comment 3            COMPREHENSIVE METABOLIC PANEL     Status: Abnormal   Collection Time   05/13/12  6:21 PM      Component Value Range Comment   Sodium 132 (*) 135 - 145 mEq/L    Potassium 4.1  3.5 - 5.1 mEq/L DELTA CHECK NOTED   Chloride 97  96 - 112 mEq/L    CO2 25  19 - 32 mEq/L    Glucose, Bld 163 (*) 70 - 99 mg/dL    BUN 16  6 - 23 mg/dL    Creatinine, Ser 8.11  0.50 - 1.35 mg/dL    Calcium 9.6  8.4 - 91.4 mg/dL    Total Protein 6.8  6.0 - 8.3 g/dL    Albumin 3.3 (*) 3.5 - 5.2 g/dL    AST 11  0 - 37 U/L    ALT 6  0 - 53 U/L    Alkaline Phosphatase 99  39 - 117 U/L    Total Bilirubin 0.4  0.3 - 1.2 mg/dL    GFR calc non Af Amer >90  >90 mL/min    GFR calc Af Amer >90  >90 mL/min   PROTIME-INR     Status: Abnormal   Collection Time   05/13/12  6:21 PM      Component Value Range Comment   Prothrombin Time 15.3 (*) 11.6 - 15.2 seconds    INR 1.23  0.00 - 1.49   D-DIMER, QUANTITATIVE     Status: Abnormal   Collection Time   05/13/12  6:21 PM      Component Value Range Comment   D-Dimer, Quant 0.53 (*) 0.00 - 0.48 ug/mL-FEU   TROPONIN I     Status: Normal   Collection Time   05/13/12  6:21 PM      Component Value Range Comment   Troponin I <0.30  <0.30 ng/mL   URINALYSIS,  ROUTINE W REFLEX MICROSCOPIC     Status: Abnormal   Collection Time   05/13/12  6:52 PM      Component Value Range Comment   Color, Urine YELLOW  YELLOW    APPearance CLEAR  CLEAR    Specific Gravity, Urine 1.025  1.005 - 1.030    pH 5.5  5.0 - 8.0    Glucose, UA 500 (*) NEGATIVE mg/dL    Hgb urine dipstick NEGATIVE  NEGATIVE    Bilirubin Urine NEGATIVE  NEGATIVE    Ketones, ur NEGATIVE  NEGATIVE mg/dL    Protein, ur NEGATIVE  NEGATIVE mg/dL    Urobilinogen, UA 1.0  0.0 - 1.0 mg/dL    Nitrite NEGATIVE  NEGATIVE    Leukocytes, UA NEGATIVE  NEGATIVE MICROSCOPIC NOT  DONE ON URINES WITH NEGATIVE PROTEIN, BLOOD, LEUKOCYTES, NITRITE, OR GLUCOSE <1000 mg/dL.  GLUCOSE, CAPILLARY     Status: Abnormal   Collection Time   05/13/12  9:48 PM      Component Value Range Comment   Glucose-Capillary 234 (*) 70 - 99 mg/dL    Comment 1 Notify RN     GLUCOSE, CAPILLARY     Status: Abnormal   Collection Time   05/13/12 10:36 PM      Component Value Range Comment   Glucose-Capillary 102 (*) 70 - 99 mg/dL    Comment 1 Notify RN       Dg Chest 2 View (if Patient Has Fever And/or Copd)  05/13/2012  *RADIOLOGY REPORT*  Clinical Data: Back pain.  Shortness of breath.  History of multiple myeloma.  Unable to sit up.  CHEST - 2 VIEW  Comparison: 03/09/2012  Findings: Heart size is normal.  There is increased mediastinal width.  Lungs are clear.  There is a large amount of free intraperitoneal air.  Findings are suspicious for a perforated bowel.  IMPRESSION:  1.  Large amount of free intraperitoneal air. 2.  Superior mediastinal widening, raising question of adenopathy. Further evaluation with CT of the chest, abdomen, and pelvis with contrast is suggested for further evaluation.  Critical test results telephoned to Dr. Rosalia Hammers at the time of interpretation on date 05/13/2012 at time 6:36 p.m.   Original Report Authenticated By: Patterson Hammersmith, M.D.    Ct Angio Chest Pe W/cm &/or Wo Cm  05/13/2012  *RADIOLOGY REPORT*  Clinical Data:  Shortness of breath and hypoxia.  Undergoing treatment for multiple myeloma.  Large amount of free peritoneal air seen on chest radiographs earlier today.  Wide mediastinum on the chest radiographs.  CT ANGIOGRAPHY CHEST CT ABDOMEN AND PELVIS WITH CONTRAST  Technique:  Multidetector CT imaging of the chest was performed using the standard protocol during bolus administration of intravenous contrast.  Multiplanar CT image reconstructions including MIPs were obtained to evaluate the vascular anatomy. Multidetector CT imaging of the abdomen and pelvis was  performed using the standard protocol during bolus administration of intravenous contrast.  Contrast: OMNIPAQUE IOHEXOL 350 MG/ML SOLN  Comparison:  Chest CT dated 03/10/2012 and abdomen and pelvis CT dated 03/09/2012.  CTA CHEST  Findings:  Again demonstrated is enlargement of the left lobe of the thyroid gland containing a coarse calcification.  This is not included in its entirety.  Multiple bilateral pulmonary arterial filling defects.  Interval mild dependent atelectasis in the right lower lobe. There is also collapse of the superior segment of the right lower lobe. No lung masses or enlarged lymph nodes.  Separately described abdominal findings.  Prominent mediastinal fat, producing the appearance of a widened mediastinum on the chest radiographs earlier today.  No adenopathy seen.  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  Interval kyphoplasty material in the T9, T10, T11 and T12 vertebral bodies with abnormal lucency in those vertebral bodies. There are approximately 20% compression deformities of the T11 and T12 vertebral bodies.  There is also abnormal lucency in the majority of the remainder of the thoracic vertebral bodies, most pronounced in the T6, T7 and T8 vertebral bodies.  There is also abnormal lucency in the manubrium.   Review of the MIP images confirms the above findings.  IMPRESSION:  1.  Multiple bilateral pulmonary emboli. 2.  Changes of multiple myeloma in the thoracic spine and manubrium, as described above. 3.  Mild right lower lobe atelectasis and collapse of the superior segment the right lower lobe.  CT ABDOMEN AND PELVIS  Findings: Large amount of free peritoneal air.  There are some minimally dilated proximal small bowel loops with some fecalization of the bowel contents.  There is also a small amount of extraluminal fluid adjacent to the small bowel and colon in the left lower abdomen, best seen on coronal image number 59.  Lumbar  spine degenerative changes.  Small amount of lucency in the left iliac bone.  Distended gallbladder with no gallbladder wall thickening or pericholecystic fluid.  No visible gallstones.  Unremarkable liver, spleen, pancreas, adrenal glands, kidneys and urinary bladder. Mildly enlarged prostate gland.  Small bilateral inguinal hernias containing fat and free peritoneal air.  No enlarged lymph nodes.  Review of the MIP images confirms the above findings.  IMPRESSION:  1.  Large amount of free peritoneal air, most likely due to perforation in the left lower abdomen, either small bowel or colon in that region, with a small amount of associated free peritoneal fluid. 2.  Mild partial small bowel obstruction. 3.  Small bilateral inguinal hernias containing fat and free peritoneal air. 4.  Changes of multiple myeloma involving the left iliac bone.  Critical Value/emergent results were called by telephone at the time of interpretation on 05/13/2012 at 2024 hours to Dr. Rosalia Hammers, who verbally acknowledged these results.   Original Report Authenticated By: Darrol Angel, M.D.    Ct Abdomen Pelvis W Contrast  05/13/2012  *RADIOLOGY REPORT*  Clinical Data:  Shortness of breath and hypoxia.  Undergoing treatment for multiple myeloma.  Large amount of free peritoneal air seen on chest radiographs earlier today.  Wide mediastinum on the chest radiographs.  CT ANGIOGRAPHY CHEST CT ABDOMEN AND PELVIS WITH CONTRAST  Technique:  Multidetector CT imaging of the chest was performed using the standard protocol during bolus administration of intravenous contrast.  Multiplanar CT image reconstructions including MIPs were obtained to evaluate the vascular anatomy. Multidetector CT imaging of the abdomen and pelvis was performed using the standard protocol during bolus administration of intravenous contrast.  Contrast: OMNIPAQUE IOHEXOL 350 MG/ML SOLN  Comparison:  Chest CT dated 03/10/2012 and abdomen and pelvis CT dated 03/09/2012.   CTA CHEST  Findings:  Again demonstrated is enlargement of the left lobe of the thyroid gland containing a coarse calcification.  This is not included in its entirety.  Multiple bilateral pulmonary arterial filling defects.  Interval mild dependent atelectasis in the right lower lobe. There is also collapse of the superior segment of the right lower lobe. No lung masses or enlarged lymph nodes.  Separately described abdominal findings.  Prominent mediastinal fat, producing the appearance of a widened mediastinum on the chest radiographs earlier today.  No adenopathy seen.  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  Interval kyphoplasty material in the T9, T10, T11 and T12 vertebral bodies with abnormal lucency in those vertebral bodies. There are approximately 20% compression deformities of the T11 and T12 vertebral bodies.  There is also abnormal lucency in the majority of the remainder of the thoracic vertebral bodies, most pronounced in the T6, T7 and T8 vertebral bodies.  There is also abnormal lucency in the manubrium.   Review of the MIP images confirms the above findings.  IMPRESSION:  1.  Multiple bilateral pulmonary emboli. 2.  Changes of multiple myeloma in the thoracic spine and manubrium, as described above. 3.  Mild right lower lobe atelectasis and collapse of the superior segment the right lower lobe.  CT ABDOMEN AND PELVIS  Findings: Large amount of free peritoneal air.  There are some minimally dilated proximal small bowel loops with some fecalization of the bowel contents.  There is also a small amount of extraluminal fluid adjacent to the small bowel and colon in the left lower abdomen, best seen on coronal image number 59.  Lumbar spine degenerative changes.  Small amount of lucency in the left iliac bone.  Distended gallbladder with no gallbladder wall thickening or pericholecystic fluid.  No visible gallstones.  Unremarkable liver, spleen, pancreas,  adrenal glands, kidneys and urinary bladder. Mildly enlarged prostate gland.  Small bilateral inguinal hernias containing fat and free peritoneal air.  No enlarged lymph nodes.  Review of the MIP images confirms the above findings.  IMPRESSION:  1.  Large amount of free peritoneal air, most likely due to perforation in the left lower abdomen, either small bowel or colon in that region, with a small amount of associated free peritoneal fluid. 2.  Mild partial small bowel obstruction. 3.  Small bilateral inguinal hernias containing fat and free peritoneal air. 4.  Changes of multiple myeloma involving the left iliac bone.  Critical Value/emergent results were called by telephone at the time of interpretation on 05/13/2012 at 2024 hours to Dr. Rosalia Hammers, who verbally acknowledged these results.   Original Report Authenticated By: Darrol Angel, M.D.    Dg Chest Port 1 View  05/13/2012  *RADIOLOGY REPORT*  Clinical Data: Central line placement  PORTABLE CHEST - 1 VIEW  Comparison: Earlier film of the same day  Findings: Right subclavian central line extends to the mid right atrium.  No pneumothorax.  Low lung volumes.  Free air under the right diaphragmatic leaflet is again noted.  Normal heart size.  No effusion. Changes of vertebral augmentation at multiple lower thoracic levels as before.  IMPRESSION: 1.  Central line to mid right atrium without pneumothorax. 2.  Persistent free intraperitoneal gas.   Original Report Authenticated By: Osa Craver, M.D.     Review of Systems  Constitutional: Positive for malaise/fatigue.  HENT: Negative.   Eyes: Negative.   Respiratory: Positive for shortness of breath.   Cardiovascular: Negative.   Gastrointestinal: Positive for constipation.  Genitourinary: Negative.   Musculoskeletal: Negative.   Skin: Negative.   Neurological: Positive for weakness.  Endo/Heme/Allergies: Negative.   Psychiatric/Behavioral: Negative.    Blood pressure 114/87, pulse 109,  temperature 99.6 F (37.6 C), temperature source Rectal, resp. rate 22, weight 215 lb (97.523 kg), SpO2 98.00%. Physical Exam  Constitutional: He is oriented to person, place, and  time. He appears well-developed and well-nourished.  HENT:  Head: Normocephalic and atraumatic.  Right Ear: External ear normal.  Left Ear: External ear normal.  Mouth/Throat: Oropharynx is clear and moist.  Eyes: Conjunctivae normal and EOM are normal. Pupils are equal, round, and reactive to light. No scleral icterus.  Neck: Normal range of motion. Neck supple. No thyromegaly present.  Cardiovascular: Normal rate, regular rhythm and normal heart sounds.   Respiratory: Effort normal and breath sounds normal. He has no wheezes. He has no rales.  GI: He exhibits distension. He exhibits no mass. There is no tenderness. There is no rebound and no guarding.  Musculoskeletal: Normal range of motion. He exhibits no edema.  Lymphadenopathy:    He has no cervical adenopathy.  Neurological: He is alert and oriented to person, place, and time.  Skin: Skin is warm and dry.  Psychiatric: He has a normal mood and affect. His behavior is normal. Thought content normal.    Assessment/Plan: 1.  Perforated viscus  - will need ex lap - discussed with patient and wife - likely to require bowel resection, possible colostomy, possible open   Wound  - IV abx's initiated by ER MD  - NPO  - plan OR once vena caval filter is placed - OR team notified and aware 2.  Bilateral pulmonary emboli  - IVC filter requested from IR  - hold anticoagulation until operative intervention complete 3.  Multiple myeloma  - chemotherapy per Dr. Arlan Organ  The risks and benefits of the procedure have been discussed at length with the patient.  The patient understands the proposed procedure, potential alternative treatments, and the course of recovery to be expected.  All of the patient's questions have been answered at this time.  The patient  wishes to proceed with surgery.  Velora Heckler, MD, [facility] Office: [phone]    Katherina Wimer M 05/14/2012, 12:46 AM

## 2012-05-14 NOTE — Procedures (Signed)
Procedure:  IVC filter placement Access:  Right IJ vein Findings:  Cavogram shows widely patent IVC.  Cook Celect IVC filter placed in infrarenal IVC.  No complications.

## 2012-05-14 NOTE — Progress Notes (Signed)
ANTIBIOTIC CONSULT NOTE - INITIAL  Pharmacy Consult for Vancomycin Indication: abdominal infection  Allergies  Allergen Reactions  . Penicillins Swelling    Patient Measurements: Height: 5\' 10"  (177.8 cm) Weight: 212 lb 15.4 oz (96.6 kg) IBW/kg (Calculated) : 73  Adjusted Body Weight:   Vital Signs: Temp: 97.7 F (36.5 C) (10/06 1000) Temp src: Axillary (10/06 1000) BP: 161/108 mmHg (10/06 1100) Pulse Rate: 115  (10/06 1100) Intake/Output from previous day: 10/05 0701 - 10/06 0700 In: 1000 [I.V.:900; IV Piggyback:100] Out: 575 [Urine:575] Intake/Output from this shift: Total I/O In: 1455 [I.V.:1425; Other:30] Out: 445 [Urine:420; Blood:25]  Labs:  Basename 05/14/12 0530 05/13/12 1821 05/13/12 1740  WBC 3.4* -- 5.8  HGB 10.3* -- 11.5*  PLT 111* -- 128*  LABCREA -- -- --  CREATININE 0.38* 0.74 0.50   Estimated Creatinine Clearance: 98.7 ml/min (by C-G formula based on Cr of 0.38). No results found for this basename: VANCOTROUGH:2,VANCOPEAK:2,VANCORANDOM:2,GENTTROUGH:2,GENTPEAK:2,GENTRANDOM:2,TOBRATROUGH:2,TOBRAPEAK:2,TOBRARND:2,AMIKACINPEAK:2,AMIKACINTROU:2,AMIKACIN:2, in the last 72 hours   Microbiology: Recent Results (from the past 720 hour(s))  WOUND CULTURE     Status: Normal   Collection Time   04/21/12  9:21 PM      Component Value Range Status Comment   Specimen Description ARM   Final    Special Requests Immunocompromised   Final    Gram Stain     Final    Value: MODERATE WBC PRESENT,BOTH PMN AND MONONUCLEAR     NO SQUAMOUS EPITHELIAL CELLS SEEN     ABUNDANT GRAM POSITIVE COCCI IN PAIRS     IN CLUSTERS   Culture     Final    Value: ABUNDANT STAPHYLOCOCCUS AUREUS     Note: RIFAMPIN AND GENTAMICIN SHOULD NOT BE USED AS SINGLE DRUGS FOR TREATMENT OF STAPH INFECTIONS.   Report Status 04/24/2012 FINAL   Final    Organism ID, Bacteria STAPHYLOCOCCUS AUREUS   Final   ANAEROBIC CULTURE     Status: Normal   Collection Time   04/25/12  5:20 PM      Component  Value Range Status Comment   Specimen Description ARM   Final    Special Requests NONE   Final    Gram Stain     Final    Value: MODERATE WBC PRESENT,BOTH PMN AND MONONUCLEAR     NO SQUAMOUS EPITHELIAL CELLS SEEN     FEW GRAM POSITIVE COCCI     IN PAIRS IN CLUSTERS   Culture NO ANAEROBES ISOLATED   Final    Report Status 04/30/2012 FINAL   Final   WOUND CULTURE     Status: Normal   Collection Time   04/25/12  6:11 PM      Component Value Range Status Comment   Specimen Description ARM   Final    Special Requests Normal   Final    Gram Stain     Final    Value: MODERATE WBC PRESENT,BOTH PMN AND MONONUCLEAR     NO SQUAMOUS EPITHELIAL CELLS SEEN     FEW GRAM POSITIVE COCCI     IN PAIRS IN CLUSTERS   Culture     Final    Value: ABUNDANT STAPHYLOCOCCUS AUREUS     Note: RIFAMPIN AND GENTAMICIN SHOULD NOT BE USED AS SINGLE DRUGS FOR TREATMENT OF STAPH INFECTIONS.   Report Status 04/28/2012 FINAL   Final    Organism ID, Bacteria STAPHYLOCOCCUS AUREUS   Final   MRSA PCR SCREENING     Status: Normal   Collection Time   05/14/12  3:05 AM      Component Value Range Status Comment   MRSA by PCR NEGATIVE  NEGATIVE Final     Medical History: Past Medical History  Diagnosis Date  . Arthritis   . GERD (gastroesophageal reflux disease)   . Complication of anesthesia     aspiration during surgery, difficult to wake up  . Multiple myeloma   . Status post radiation therapy 03/23/12 - 04/05/12    T8 - L4/ 20 Gy/ 10 Fractions  . Dysphagia 05/06/12    ED Visit    Medications:  Scheduled:    . sodium chloride  1,000 mL Intravenous Once   Followed by  . sodium chloride  1,000 mL Intravenous Once  . dextrose 5 % and 0.45 % NaCl with KCl 20 mEq/L      . fentaNYL      . hydrocortisone sod succinate (SOLU-CORTEF) injection  100 mg Intravenous Q8H  . insulin aspart  0-9 Units Subcutaneous Q4H  . meropenem (MERREM) IV  1 g Intravenous Q8H  . midazolam      .  morphine injection  2 mg  Intravenous Once  . morphine      . sodium chloride  500 mL Intravenous Once  . DISCONTD: HYDROmorphone PCA 0.3 mg/mL   Intravenous Q4H  . DISCONTD: insulin regular  2-8 Units Subcutaneous Q6H  . DISCONTD: levofloxacin (LEVAQUIN) IV  750 mg Intravenous Q24H  . DISCONTD: metronidazole  500 mg Intravenous Q8H   Infusions:    . sodium chloride    . dextrose 5 % and 0.45 % NaCl with KCl 20 mEq/L 125 mL/hr at 05/14/12 1035  . fentaNYL infusion INTRAVENOUS    . midazolam (VERSED) infusion    . DISCONTD: sodium chloride 100 mL/hr (05/13/12 2259)   PRN: sodium chloride, fentaNYL, iohexol, iohexol, midazolam, DISCONTD: diphenhydrAMINE, DISCONTD: diphenhydrAMINE, DISCONTD: fentaNYL, DISCONTD:  HYDROmorphone (DILAUDID) injection, DISCONTD:  HYDROmorphone (DILAUDID) injection, DISCONTD: HYDROmorphone HCl, DISCONTD: naloxone, DISCONTD: ondansetron (ZOFRAN) IV, DISCONTD: promethazine, DISCONTD: promethazine, DISCONTD: sodium chloride, DISCONTD: sodium chloride irrigation Assessment: 71 yo M with bowel perforation;  Patient has hx of multiple myeloma CT shows bilateral PEs   Goal of Therapy:  Vancomycin trough level 15-20 mcg/ml  Plan:  1) Begin Vancomycin 1 Gm IV q 8 hours. 2) Vanc trough level at steady state 3)Follow up culture results  Loletta Specter 05/14/2012,11:06 AM

## 2012-05-14 NOTE — Anesthesia Postprocedure Evaluation (Signed)
  Anesthesia Post-op Note  Patient: Robert Berger  Procedure(s) Performed: Procedure(s) (LRB): EXPLORATORY LAPAROTOMY (N/A) LAPAROSCOPIC SIGMOID COLECTOMY (N/A) COLOSTOMY (N/A)  Patient Location: PACU  Anesthesia Type: General  Level of Consciousness: sedated   Airway and Oxygen Therapy: Intubated on ventilator  Post-op Pain: looks comfortable  Post-op Assessment: Post-op Vital signs reviewed, Patient's Cardiovascular Status Stable, Respiratory Function Stable  Post-op Vital Signs: stable. Tachycardic similar to preoperative status.  Complications: Prolonged response to Succinylcholine. Re-intubated for weakness.

## 2012-05-14 NOTE — Progress Notes (Signed)
Conscious Sedation for IVC Filter placement in [facility] IR 0158 Versed 0.5mg  given by A. Ward, RN 0158 Fentanyl given by A. Ward, RN [phone] Start Conscious Sedation  0203 V/S 88 20 97%2LNC Unable to obtain initial BP due to IR staff disconnected BP cuff during procedure. 0208 V/S 92 11 98%2LNC 0212 118/83 12 97% 90 0212 Procedure Complete 0215 Sedation Complete

## 2012-05-14 NOTE — Brief Op Note (Signed)
05/13/2012 - 05/14/2012  9:40 AM  PATIENT:  Robert Berger  71 y.o. 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  Exploratory laparotomy  2.  Sigmoid colectomy  3.  Descending colostomy with Hartmann's pouch  SURGEON:  Surgeon(s) and Role:    * Velora Heckler, MD - Primary    * Ardeth Sportsman, MD - Assisting  ANESTHESIA:   general  EBL:  Total I/O In: 1000 [I.V.:1000] Out: 400 [Urine:400]  BLOOD ADMINISTERED:none  DRAINS: none   LOCAL MEDICATIONS USED:  NONE  SPECIMEN:  Excision  DISPOSITION OF SPECIMEN:  PATHOLOGY  COUNTS:  YES  TOURNIQUET:  * No tourniquets in log *  DICTATION: .Other Dictation: Dictation Number [id number]  PLAN OF CARE: Admit to inpatient   PATIENT DISPOSITION:  PACU - hemodynamically stable.   Delay start of Pharmacological VTE agent (>24hrs) due to surgical blood loss or risk of bleeding: yes  Velora Heckler, MD, [facility] Office: [phone]

## 2012-05-14 NOTE — Progress Notes (Addendum)
Left: DVT noted in the common femoral, saphenofemoral junction, and posterior tibial veins.  No evidence of superficial thrombosis.  No Baker's cyst.  Right:  DVT noted in the posterior tibial and peroneal veins.  No evidence of superficial thrombosis.  No Baker's cyst.

## 2012-05-14 NOTE — Procedures (Signed)
Central Venous Catheter Insertion Procedure Note Kuldeep Cotes [id number] 02-20-1941  Procedure: Insertion of Central Venous Catheter Indications: Drug and/or fluid administration  Procedure Details Consent: Risks of procedure as well as the alternatives and risks of each were explained to the (patient/caregiver).  Consent for procedure obtained. Time Out: Verified patient identification, verified procedure, site/side was marked, verified correct patient position, special equipment/implants available, medications/allergies/relevent history reviewed, required imaging and test results available.  Performed  Maximum sterile technique was used including antiseptics, cap, gloves, gown, hand hygiene, mask and sheet. Skin prep: Chlorhexidine; local anesthetic administered A antimicrobial bonded/coated triple lumen catheter was placed in the right subclavian vein using the Seldinger technique.  Evaluation Blood flow good Complications: No apparent complications Patient did tolerate procedure well. Chest X-ray ordered to verify placement.  CXR: normal.  Onofrio Klemp K. 05/14/2012, 12:50 AM

## 2012-05-14 NOTE — ED Notes (Signed)
Patient transported to IR 

## 2012-05-14 NOTE — Anesthesia Preprocedure Evaluation (Deleted)
Anesthesia Evaluation  Patient identified by MRN, date of birth, ID band Patient awake  General Assessment Comment:Multiple Myeloma with multiple lytic bone lesions.  Reviewed: Allergy & Precautions, H&P , NPO status , Patient's Chart, lab work & pertinent test results  Airway Mallampati: II TM Distance: >3 FB Neck ROM: Full    Dental No notable dental hx.    Pulmonary former smoker,  Current bilateral PE.S/p IVC filter. 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  Renal/GU negative Renal ROS  negative genitourinary   Musculoskeletal negative musculoskeletal ROS (+)   Abdominal   Peds negative pediatric ROS (+)  Hematology negative hematology ROS (+)   Anesthesia Other Findings   Reproductive/Obstetrics negative OB ROS                           Anesthesia Physical Anesthesia Plan  ASA: III and Emergent  Anesthesia Plan: General   Post-op Pain Management:    Induction: Intravenous  Airway Management Planned: Oral ETT  Additional Equipment:   Intra-op Plan:   Post-operative Plan: Possible Post-op intubation/ventilation  Informed Consent: I have reviewed the patients History and Physical, chart, labs and discussed the procedure including the risks, benefits and alternatives for the proposed anesthesia with the patient or authorized representative who has indicated his/her understanding and acceptance.   Dental advisory given  Plan Discussed with: CRNA  Anesthesia Plan Comments:         Anesthesia Quick Evaluation

## 2012-05-15 ENCOUNTER — Encounter (HOSPITAL_COMMUNITY): Payer: Self-pay | Admitting: Surgery

## 2012-05-15 ENCOUNTER — Inpatient Hospital Stay (HOSPITAL_COMMUNITY): Payer: BC Managed Care – PPO

## 2012-05-15 ENCOUNTER — Ambulatory Visit: Payer: BC Managed Care – PPO | Admitting: Internal Medicine

## 2012-05-15 ENCOUNTER — Telehealth: Payer: Self-pay | Admitting: Hematology & Oncology

## 2012-05-15 DIAGNOSIS — R109 Unspecified abdominal pain: Secondary | ICD-10-CM

## 2012-05-15 DIAGNOSIS — J9600 Acute respiratory failure, unspecified whether with hypoxia or hypercapnia: Secondary | ICD-10-CM

## 2012-05-15 DIAGNOSIS — I369 Nonrheumatic tricuspid valve disorder, unspecified: Secondary | ICD-10-CM

## 2012-05-15 DIAGNOSIS — K631 Perforation of intestine (nontraumatic): Secondary | ICD-10-CM

## 2012-05-15 DIAGNOSIS — I2782 Chronic pulmonary embolism: Secondary | ICD-10-CM

## 2012-05-15 DIAGNOSIS — I82409 Acute embolism and thrombosis of unspecified deep veins of unspecified lower extremity: Secondary | ICD-10-CM

## 2012-05-15 LAB — GLUCOSE, CAPILLARY: Glucose-Capillary: 220 mg/dL — ABNORMAL HIGH (ref 70–99)

## 2012-05-15 LAB — BASIC METABOLIC PANEL
CO2: 25 mEq/L (ref 19–32)
Calcium: 7.1 mg/dL — ABNORMAL LOW (ref 8.4–10.5)
Potassium: 4.2 mEq/L (ref 3.5–5.1)
Sodium: 134 mEq/L — ABNORMAL LOW (ref 135–145)

## 2012-05-15 LAB — BLOOD GAS, ARTERIAL
Acid-Base Excess: 0.5 mmol/L (ref 0.0–2.0)
Bicarbonate: 24 mEq/L (ref 20.0–24.0)
FIO2: 0.3 %
Patient temperature: 98.6
TCO2: 22.2 mmol/L (ref 0–100)
pCO2 arterial: 36.3 mmHg (ref 35.0–45.0)
pH, Arterial: 7.437 (ref 7.350–7.450)

## 2012-05-15 LAB — CBC
Hemoglobin: 9.4 g/dL — ABNORMAL LOW (ref 13.0–17.0)
RBC: 2.96 MIL/uL — ABNORMAL LOW (ref 4.22–5.81)

## 2012-05-15 LAB — PHOSPHORUS: Phosphorus: 2.1 mg/dL — ABNORMAL LOW (ref 2.3–4.6)

## 2012-05-15 LAB — MAGNESIUM: Magnesium: 2.2 mg/dL (ref 1.5–2.5)

## 2012-05-15 LAB — HEPARIN LEVEL (UNFRACTIONATED): Heparin Unfractionated: 0.69 IU/mL (ref 0.30–0.70)

## 2012-05-15 MED ORDER — METOPROLOL TARTRATE 1 MG/ML IV SOLN
INTRAVENOUS | Status: AC
  Filled 2012-05-15: qty 5

## 2012-05-15 MED ORDER — POTASSIUM CHLORIDE IN NACL 20-0.45 MEQ/L-% IV SOLN
INTRAVENOUS | Status: DC
  Filled 2012-05-15: qty 1000

## 2012-05-15 MED ORDER — DIGOXIN 0.25 MG/ML IJ SOLN
0.2500 mg | INTRAMUSCULAR | Status: AC
  Administered 2012-05-15: 0.25 mg via INTRAVENOUS
  Filled 2012-05-15: qty 1

## 2012-05-15 MED ORDER — HEPARIN (PORCINE) IN NACL 100-0.45 UNIT/ML-% IJ SOLN
1450.0000 [IU]/h | INTRAMUSCULAR | Status: DC
  Filled 2012-05-15 (×2): qty 250

## 2012-05-15 MED ORDER — SODIUM CHLORIDE 0.9 % IV SOLN
Freq: Once | INTRAVENOUS | Status: AC
  Administered 2012-05-15: 15:00:00 via INTRAVENOUS

## 2012-05-15 MED ORDER — DIGOXIN 0.25 MG/ML IJ SOLN
0.1250 mg | Freq: Four times a day (QID) | INTRAMUSCULAR | Status: AC
  Administered 2012-05-15 – 2012-05-16 (×2): 0.125 mg via INTRAVENOUS
  Filled 2012-05-15 (×2): qty 0.5

## 2012-05-15 MED ORDER — METOPROLOL TARTRATE 1 MG/ML IV SOLN
5.0000 mg | INTRAVENOUS | Status: DC | PRN
  Administered 2012-05-15: 5 mg via INTRAVENOUS

## 2012-05-15 MED ORDER — DIGOXIN 0.25 MG/ML IJ SOLN
0.1250 mg | Freq: Every day | INTRAMUSCULAR | Status: DC
  Filled 2012-05-15: qty 0.5

## 2012-05-15 MED ORDER — SODIUM CHLORIDE 0.9 % IV SOLN
INTRAVENOUS | Status: DC
  Administered 2012-05-15: 02:00:00 via INTRAVENOUS
  Administered 2012-05-15: 100 mL/h via INTRAVENOUS
  Administered 2012-05-15 – 2012-05-20 (×5): via INTRAVENOUS
  Administered 2012-05-21: 20 mL/h via INTRAVENOUS
  Administered 2012-05-23: 23:00:00 via INTRAVENOUS
  Administered 2012-05-23: 20 mL/h via INTRAVENOUS

## 2012-05-15 NOTE — Progress Notes (Addendum)
Name: Robert Berger MRN: [id number] DOB: [birth year]    LOS: 2  Referring Provider:  Dr. Rosalia Hammers Reason for Referral:  PE, bowel perforation  PULMONARY / CRITICAL CARE MEDICINE  HPI:  71 yo male admitted 05/13/2012 dyspnea from b/l PE and perforated sigmoid colon.  PCCM asked to admit.  Remained on vent post-op. PMHx MM, chronic pain, HTN  Vital Signs: Temp:  [97.5 F (36.4 C)-99.1 F (37.3 C)] 98.9 F (37.2 C) (10/07 0800) Pulse Rate:  [101-117] 115  (10/07 0900) Resp:  [13-27] 13  (10/07 0900) BP: (90-161)/(38-108) 103/73 mmHg (10/07 0900) SpO2:  [93 %-100 %] 96 % (10/07 0900) FiO2 (%):  [30 %-100 %] 30 % (10/07 0800) Weight:  [219 lb 5.7 oz (99.5 kg)] 219 lb 5.7 oz (99.5 kg) (10/07 0000)  Physical Examination: General - no distress HEENT - ETT, NG tube in place Cardiac - s1s2 no murmur Chest - scattered rhonchi Abd - soft, mild tenderness Ext - no edema Neuro - sedated, follows commands  Dg Chest 2 View (if Patient Has Fever And/or Copd)  05/13/2012  *RADIOLOGY REPORT*  Clinical Data: Back pain.  Shortness of breath.  History of multiple myeloma.  Unable to sit up.  CHEST - 2 VIEW  Comparison: 03/09/2012  Findings: Heart size is normal.  There is increased mediastinal width.  Lungs are clear.  There is a large amount of free intraperitoneal air.  Findings are suspicious for a perforated bowel.  IMPRESSION:  1.  Large amount of free intraperitoneal air. 2.  Superior mediastinal widening, raising question of adenopathy. Further evaluation with CT of the chest, abdomen, and pelvis with contrast is suggested for further evaluation.  Critical test results telephoned to Dr. Rosalia Hammers at the time of interpretation on date 05/13/2012 at time 6:36 p.m.   Original Report Authenticated By: Patterson Hammersmith, M.D.    Ct Angio Chest Pe W/cm &/or Wo Cm  05/13/2012  *RADIOLOGY REPORT*  Clinical Data:  Shortness of breath and hypoxia.  Undergoing treatment for multiple myeloma.  Large amount of free  peritoneal air seen on chest radiographs earlier today.  Wide mediastinum on the chest radiographs.  CT ANGIOGRAPHY CHEST CT ABDOMEN AND PELVIS WITH CONTRAST  Technique:  Multidetector CT imaging of the chest was performed using the standard protocol during bolus administration of intravenous contrast.  Multiplanar CT image reconstructions including MIPs were obtained to evaluate the vascular anatomy. Multidetector CT imaging of the abdomen and pelvis was performed using the standard protocol during bolus administration of intravenous contrast.  Contrast: OMNIPAQUE IOHEXOL 350 MG/ML SOLN  Comparison:  Chest CT dated 03/10/2012 and abdomen and pelvis CT dated 03/09/2012.  CTA CHEST  Findings:  Again demonstrated is enlargement of the left lobe of the thyroid gland containing a coarse calcification.  This is not included in its entirety.  Multiple bilateral pulmonary arterial filling defects.  Interval mild dependent atelectasis in the right lower lobe. There is also collapse of the superior segment of the right lower lobe. No lung masses or enlarged lymph nodes.  Separately described abdominal findings.  Prominent mediastinal fat, producing the appearance of a widened mediastinum on the chest radiographs earlier today.  No adenopathy seen.  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  Interval kyphoplasty material in the T9, T10, T11 and T12 vertebral bodies with abnormal lucency in those vertebral bodies. There are approximately 20% compression deformities of the T11 and T12 vertebral bodies.  There is  also abnormal lucency in the majority of the remainder of the thoracic vertebral bodies, most pronounced in the T6, T7 and T8 vertebral bodies.  There is also abnormal lucency in the manubrium.   Review of the MIP images confirms the above findings.  IMPRESSION:  1.  Multiple bilateral pulmonary emboli. 2.  Changes of multiple myeloma in the thoracic spine and manubrium,  as described above. 3.  Mild right lower lobe atelectasis and collapse of the superior segment the right lower lobe.  CT ABDOMEN AND PELVIS  Findings: Large amount of free peritoneal air.  There are some minimally dilated proximal small bowel loops with some fecalization of the bowel contents.  There is also a small amount of extraluminal fluid adjacent to the small bowel and colon in the left lower abdomen, best seen on coronal image number 59.  Lumbar spine degenerative changes.  Small amount of lucency in the left iliac bone.  Distended gallbladder with no gallbladder wall thickening or pericholecystic fluid.  No visible gallstones.  Unremarkable liver, spleen, pancreas, adrenal glands, kidneys and urinary bladder. Mildly enlarged prostate gland.  Small bilateral inguinal hernias containing fat and free peritoneal air.  No enlarged lymph nodes.  Review of the MIP images confirms the above findings.  IMPRESSION:  1.  Large amount of free peritoneal air, most likely due to perforation in the left lower abdomen, either small bowel or colon in that region, with a small amount of associated free peritoneal fluid. 2.  Mild partial small bowel obstruction. 3.  Small bilateral inguinal hernias containing fat and free peritoneal air. 4.  Changes of multiple myeloma involving the left iliac bone.  Critical Value/emergent results were called by telephone at the time of interpretation on 05/13/2012 at 2024 hours to Dr. Rosalia Hammers, who verbally acknowledged these results.   Original Report Authenticated By: Darrol Angel, M.D.    Ct Abdomen Pelvis W Contrast  05/13/2012  *RADIOLOGY REPORT*  Clinical Data:  Shortness of breath and hypoxia.  Undergoing treatment for multiple myeloma.  Large amount of free peritoneal air seen on chest radiographs earlier today.  Wide mediastinum on the chest radiographs.  CT ANGIOGRAPHY CHEST CT ABDOMEN AND PELVIS WITH CONTRAST  Technique:  Multidetector CT imaging of the chest was performed using  the standard protocol during bolus administration of intravenous contrast.  Multiplanar CT image reconstructions including MIPs were obtained to evaluate the vascular anatomy. Multidetector CT imaging of the abdomen and pelvis was performed using the standard protocol during bolus administration of intravenous contrast.  Contrast: OMNIPAQUE IOHEXOL 350 MG/ML SOLN  Comparison:  Chest CT dated 03/10/2012 and abdomen and pelvis CT dated 03/09/2012.  CTA CHEST  Findings:  Again demonstrated is enlargement of the left lobe of the thyroid gland containing a coarse calcification.  This is not included in its entirety.  Multiple bilateral pulmonary arterial filling defects.  Interval mild dependent atelectasis in the right lower lobe. There is also collapse of the superior segment of the right lower lobe. No lung masses or enlarged lymph nodes.  Separately described abdominal findings.  Prominent mediastinal fat, producing the appearance of a widened mediastinum on the chest radiographs earlier today.  No adenopathy seen.  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  Interval kyphoplasty material in the T9, T10, T11 and T12 vertebral bodies with abnormal lucency in those vertebral bodies. There are approximately 20% compression deformities of the T11 and T12 vertebral bodies.  There is  also abnormal lucency in the majority of the remainder of the thoracic vertebral bodies, most pronounced in the T6, T7 and T8 vertebral bodies.  There is also abnormal lucency in the manubrium.   Review of the MIP images confirms the above findings.  IMPRESSION:  1.  Multiple bilateral pulmonary emboli. 2.  Changes of multiple myeloma in the thoracic spine and manubrium, as described above. 3.  Mild right lower lobe atelectasis and collapse of the superior segment the right lower lobe.  CT ABDOMEN AND PELVIS  Findings: Large amount of free peritoneal air.  There are some minimally dilated  proximal small bowel loops with some fecalization of the bowel contents.  There is also a small amount of extraluminal fluid adjacent to the small bowel and colon in the left lower abdomen, best seen on coronal image number 59.  Lumbar spine degenerative changes.  Small amount of lucency in the left iliac bone.  Distended gallbladder with no gallbladder wall thickening or pericholecystic fluid.  No visible gallstones.  Unremarkable liver, spleen, pancreas, adrenal glands, kidneys and urinary bladder. Mildly enlarged prostate gland.  Small bilateral inguinal hernias containing fat and free peritoneal air.  No enlarged lymph nodes.  Review of the MIP images confirms the above findings.  IMPRESSION:  1.  Large amount of free peritoneal air, most likely due to perforation in the left lower abdomen, either small bowel or colon in that region, with a small amount of associated free peritoneal fluid. 2.  Mild partial small bowel obstruction. 3.  Small bilateral inguinal hernias containing fat and free peritoneal air. 4.  Changes of multiple myeloma involving the left iliac bone.  Critical Value/emergent results were called by telephone at the time of interpretation on 05/13/2012 at 2024 hours to Dr. Rosalia Hammers, who verbally acknowledged these results.   Original Report Authenticated By: Darrol Angel, M.D.    Ir Ivc Filter Plmt / S&i /img Guid/mod Sed  05/14/2012  *RADIOLOGY REPORT*  Clinical Data:  Acute pulmonary embolism and perforated bowel.  The patient requires IVC filter emergently prior to planned laparotomy for bowel perforation.  1.  ULTRASOUND GUIDANCE FOR VASCULAR ACCESS OF THE RIGHT INTERNAL JUGULAR VEIN. 2.  IVC VENOGRAM 3.  PERCUTANEOUS IVC FILTER PLACEMENT  Sedation:  0.5 mg IV Versed; 25 mcg IV Fentanyl.  Total Moderate Sedation Time:  15 minutes.  Contrast:  35 ml Omnipaque-300  Fluoroscopy Time: 1.2 minutes.  Procedure:  The procedure, risks, benefits, and alternatives were explained to the patient.   Questions regarding the procedure were encouraged and answered.  The patient understands and consents to the procedure.  The right neck was prepped with Betadine in a sterile fashion, and a sterile drape was applied covering the operative field.  A sterile gown and sterile gloves were used for the procedure.  Local anesthesia was provided with 1% Lidocaine.  Under direct ultrasound guidance, a 21 gauge needle was advanced into the right internal jugular vein with ultrasound image documentation performed.  After securing access with a micropuncture dilator, a guidewire was advanced into the inferior vena cava.  A deployment sheath was advanced over the guidewire. This was utilized to perform IVC venography.  The deployment sheath was further positioned in an appropriate location for filter deployment.  A Cook Celect IVC filter was then advanced in the sheath.  This was then fully deployed in the infrarenal IVC.  Final filter position was confirmed with a fluoroscopic spot image.  Contrast injection was also performed through the  sheath under fluoroscopy to confirm patency of the IVC at the level of the filter.  After the procedure the sheath was removed and hemostasis obtained with manual compression.  Complications: None  Findings:  IVC venography demonstrates a normal caliber IVC with no evidence of thrombus.  Renal veins are identified bilaterally.  The IVC filter was successfully positioned below the level of the renal veins and is appropriately oriented.  This IVC filter has both permanent and retrievable indications.  IMPRESSION:  Placement of percutaneous IVC filter in infrarenal IVC.  IVC venogram shows no evidence of IVC thrombus and normal caliber of the inferior vena cava.  This filter does have both permanent and retrievable indications.   Original Report Authenticated By: Reola Calkins, M.D.    Dg Chest Port 1 View  05/15/2012  *RADIOLOGY REPORT*  Clinical Data: 71 year old male atelectasis.   Respiratory failure.  PORTABLE CHEST - 1 VIEW  Comparison: 05/14/2012 and earlier.  Findings: Portable semi upright AP view 0439 hours.  Endotracheal tube tip projects now just above the level of clavicles.  Enteric tube courses to the abdomen, tip not included.  Stable right subclavian central line. Continued low lung volumes.  Stable cardiac size and mediastinal contours.  No pneumothorax or pulmonary edema.  No definite effusion.  Mildly increased perihilar and basilar opacity. Multilevel spinal vertebroplasty changes.  IMPRESSION: 1.  Endotracheal tube tip now just above the level of clavicles. Otherwise, stable lines and tubes. 2.  Very low lung volumes with perihilar and basilar opacity which most resembles atelectasis.   Original Report Authenticated By: [name], M.D.    Dg Chest Port 1 View  05/14/2012  *RADIOLOGY REPORT*  Clinical Data: Status post intubation and laparotomy this morning for colonic perforation.  Acute pulmonary embolism.  PORTABLE CHEST - 1 VIEW  Comparison: 05/13/2012  Findings: Central line positioning stable with the tip in the right atrium.  Endotracheal tube tip approximately 2 cm above the carina. Nasogastric tube extends below the diaphragm.  Lungs show low volumes with mild bibasilar atelectasis.  No edema, pneumothorax or significant pleural effusion identified.  Heart size is normal.  IMPRESSION: Satisfactory positioning of endotracheal tube with tip approximately 2 cm above the carina.  The central line tip remains in the right atrium.   Original Report Authenticated By: Reola Calkins, M.D.    Dg Chest Port 1 View  05/13/2012  *RADIOLOGY REPORT*  Clinical Data: Central line placement  PORTABLE CHEST - 1 VIEW  Comparison: Earlier film of the same day  Findings: Right subclavian central line extends to the mid right atrium.  No pneumothorax.  Low lung volumes.  Free air under the right diaphragmatic leaflet is again noted.  Normal heart size.  No effusion. Changes  of vertebral augmentation at multiple lower thoracic levels as before.  IMPRESSION: 1.  Central line to mid right atrium without pneumothorax. 2.  Persistent free intraperitoneal gas.   Original Report Authenticated By: Thora Lance III, M.D.     ASSESSMENT AND PLAN  PULMONARY  Lab 05/15/12 0355 05/14/12 1132  PHART 7.437 7.369  PCO2ART 36.3 36.9  PO2ART 74.9* 195.0*  HCO3 24.0 20.8  O2SAT 97.2 99.3   Ventilator Settings:  ETT:  10/06>>  CT chest 10/05>>Multiple b/l PE, changes of MM in thoracic spine and manubrium, basilar ATX Doppler legs 10/06>>Acute DVT Rt posterior tibial and peroneal veins, and Lt common femoral, saphenofemoral junction, and posterior tibial veins.  A:  Acute respiratory failure 2nd to  PE and perforated sigmoid colon.  S/p IVC by IR 10/06. P:   Continue pressure support wean as tolerated >> not ready for extubation F/u CXR Titrate oxygen to keep SpO2 > 92% Continue heparin gtt>>will eventually need to transition to coumadin, and have IV filter removed when more stable Will need to f/u doppler legs week of 10/14 F/u Echo to assess LV and RV function  CARDIOVASCULAR  Lab 05/13/12 1821  TROPONINI <0.30  LATICACIDVEN --  PROBNP --   Lines:  Rt Ong CVL 10/05>>  A: Hx of HTN. P:  Monitor BP Monitor CVP to assess volume status   RENAL  Lab 05/15/12 0435 05/14/12 0530 05/13/12 1821 05/13/12 1740 05/10/12 1112  NA 134* 135 132* 133* 137  K 4.2 4.2 -- -- --  CL 102 101 97 97 96*  CO2 25 24 25 22 29   BUN 9 16 16 23 13   CREATININE <0.20* 0.38* 0.74 0.50 0.5*  CALCIUM 7.1* 8.0* 9.6 8.6 8.5  MG 2.2 2.4 -- -- --  PHOS 2.1* 3.4 -- -- --   Intake/Output      10/06 0701 - 10/07 0700 10/07 0701 - 10/08 0700   P.O. 1000    I.V. (mL/kg) 4111.5 (41.3) 266 (2.7)   Other 30    NG/GT 30    IV Piggyback 910    Total Intake(mL/kg) 6081.5 (61.1) 266 (2.7)   Urine (mL/kg/hr) 1250 (0.5) 60   Emesis/NG output 225    Blood 25    Total Output 1500 60    Net +4581.5 +206          Intake/Output Summary (Last 24 hours) at 05/15/12 1016 Last data filed at 05/15/12 0900  Gross per 24 hour  Intake 5017.5 ml  Output   1135 ml  Net 3882.5 ml   Foley:  none  A: Hypovolemia>>improved. P:   Keep in even fluid balance F/u renal fx, urine outpt, electrolytes  GASTROINTESTINAL  Lab 05/13/12 1821 05/10/12 1112  AST 11 43*  ALT 6 --  ALKPHOS 99 113*  BILITOT 0.4 1.10  PROT 6.8 5.9*  ALBUMIN 3.3* --   CT abd/pelvis 10/05>>free air, partial SBO, b/l inguinal hernias, changes of MM Lt iliac bone  A:  Perforated sigmoid colon likely 2nd to severe constipation from opiate use. P:   Post-op care and nutrition per [facility]  HEMATOLOGIC  Lab 05/15/12 0435 05/14/12 1131 05/14/12 0530 05/13/12 1821 05/13/12 1740 05/10/12 1016  HGB 9.4* -- 10.3* -- 11.5* 10.6*  HCT 28.0* -- 30.2* -- 33.8* 30.8*  PLT 123* -- 111* -- 128* 98*  INR -- -- -- 1.23 -- --  APTT -- 28 -- -- -- --   A:  Anemia and thrombocytopenia in setting of multiple myeloma and critical illness.  Followed by Dr. Myna Hidalgo as outpt.  On zometa and velcade with decadron as outpt. 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 Hyperglycemia. P:   Continue  solucortef SSI  NEUROLOGIC  A: Chronic pain. P:   Sedation protocol while on vent  Critical care time 40 minutes  Coralyn Helling, MD [facility] Pulmonary/Critical  Care 05/15/2012, 10:29 AM Pager:  [phone] After 3pm call: [phone]

## 2012-05-15 NOTE — Progress Notes (Signed)
  Echocardiogram 2D Echocardiogram has been performed.  Robert Berger 05/15/2012, 11:45 AM

## 2012-05-15 NOTE — Progress Notes (Addendum)
INITIAL ADULT NUTRITION ASSESSMENT Date: 05/15/2012   Time: 1:03 PM Reason for Assessment: vent, Nutrition Risk, Low Braden  ASSESSMENT: Male 71 y.o.  Dx: Bowel perforation requiring open lap, colostomy, PE, multiple myeloma, several problems with skin,- stage 2 and new incision-closed.  Hx:  Past Medical History  Diagnosis Date  . Arthritis   . GERD (gastroesophageal reflux disease)   . Complication of anesthesia     aspiration during surgery, difficult to wake up  . Multiple myeloma   . Status post radiation therapy 03/23/12 - 04/05/12    T8 - L4/ 20 Gy/ 10 Fractions  . Dysphagia 05/06/12    ED Visit   Past Surgical History  Procedure Date  . Fracture surgery     ankle  . Hemorrhoid surgery   . Back surgery     47 years ago  . Laparotomy 05/14/2012    Procedure: EXPLORATORY LAPAROTOMY;  Surgeon: Velora Heckler, MD;  Location: [facility];  Service: General;  Laterality: N/A;  . Colostomy 05/14/2012    Procedure: COLOSTOMY;  Surgeon: Velora Heckler, MD;  Location: [facility];  Service: General;  Laterality: N/A;    Related Meds:     . antiseptic oral rinse  15 mL Mouth Rinse QID  . chlorhexidine  15 mL Mouth/Throat BID  . dextrose 5 % and 0.45 % NaCl with KCl 20 mEq/L      . fentaNYL      . hydrocortisone sod succinate (SOLU-CORTEF) injection  100 mg Intravenous Q8H  . insulin aspart  0-9 Units Subcutaneous Q4H  . meropenem (MERREM) IV  1 g Intravenous Q8H  . midazolam      . pantoprazole (PROTONIX) IV  40 mg Intravenous Q24H  . DISCONTD: sodium chloride  1,000 mL Intravenous Once  . DISCONTD: vancomycin  1,000 mg Intravenous Q8H   Ht: 5\' 10"  (177.8 cm) Wt: 219 lb 5.7 oz (99.5 kg)  Ideal Wt: 75.5 kg % Ideal Wt: 132  Usual Wt: 260# Wt Readings from Last 10 Encounters:  05/15/12 219 lb 5.7 oz (99.5 kg)  05/15/12 219 lb 5.7 oz (99.5 kg)  04/05/12 230 lb (104.327 kg)  03/09/12 250 lb (113.399 kg)  03/09/12 250 lb (113.399 kg)  03/09/12 250 lb (113.399 kg)    % Usual Wt:  88% of weight 2 months ago, 84% weight 3 months ago  Body mass index is 31.47 kg/(m^2).-obesity grade 1  Labs:  CMP     Component Value Date/Time   NA 134* 05/15/2012 0435   NA 137 05/10/2012 1112   K 4.2 05/15/2012 0435   K 5.1* 05/10/2012 1112   CL 102 05/15/2012 0435   CL 96* 05/10/2012 1112   CO2 25 05/15/2012 0435   CO2 29 05/10/2012 1112   GLUCOSE 183* 05/15/2012 0435   GLUCOSE 193* 05/10/2012 1112   BUN 9 05/15/2012 0435   BUN 13 05/10/2012 1112   CREATININE <0.20* 05/15/2012 0435   CREATININE 0.5* 05/10/2012 1112   CALCIUM 7.1* 05/15/2012 0435   CALCIUM 8.5 05/10/2012 1112   PROT 6.8 05/13/2012 1821   PROT 5.9* 05/10/2012 1112   ALBUMIN 3.3* 05/13/2012 1821   AST 11 05/13/2012 1821   AST 43* 05/10/2012 1112   ALT 6 05/13/2012 1821   ALKPHOS 99 05/13/2012 1821   ALKPHOS 113* 05/10/2012 1112   BILITOT 0.4 05/13/2012 1821   BILITOT 1.10 05/10/2012 1112   GFRNONAA NOT CALCULATED 05/15/2012 0435   GFRAA NOT CALCULATED 05/15/2012 0435    I/O  last 3 completed shifts: In: 7081.5 [P.O.:1000; I.V.:5011.5; Other:30; NG/GT:30; IV Piggyback:1010] Out: 2075 [Urine:1825; Emesis/NG output:225; Blood:25] Total I/O In: 666 [I.V.:666] Out: 240 [Urine:240]   Diet Order: NPO  Supplements/Tube Feeding:  none  IVF:    sodium chloride Last Rate: 100 mL/hr at 05/15/12 1220  fentaNYL infusion INTRAVENOUS Last Rate: 110 mcg/hr (05/15/12 0600)  heparin Last Rate: 1,450 Units/hr (05/15/12 1100)  midazolam (VERSED) infusion Last Rate: 3 mg/hr (05/14/12 2100)  DISCONTD: 0.45 % NaCl with KCl 20 mEq / L   DISCONTD: sodium chloride   DISCONTD: dextrose 5 % and 0.45 % NaCl with KCl 20 mEq/L Last Rate: 125 mL/hr at 05/14/12 1035  DISCONTD: heparin Last Rate: 1,500 Units/hr (05/15/12 0935)    Estimated Nutritional Needs:   Kcal: 1975 Protein: 105-115 gm Fluid: >1.9L  Food/Nutrition Related Hx: Spoke with family.  Pt with decreased intake and >40# (16%)  weight loss over the past 3 months.  .  Glucose  elevated on steroids.  Now intubated.  NG tube in place.   No output from colostomy yet.  Pt meets criteria for severe malnutrition related to chronic illness AEB 16% weight loss over the past 3 months, Intake <75% for greater than one month.  .NUTRITION DIAGNOSIS: -Inadequate oral intake (NI-2.1).  Status: Ongoing  RELATED TO: inability to eat  AS EVIDENCE BY: npo status  MONITORING/EVALUATION(Goals): Goal:  Based on plan of care Monitor:  Plan of care, diet advancement, labs, weight  EDUCATION NEEDS: -No education needs identified at this time  INTERVENTION: Recommend TPN until gut function returns  Dietitian #:  [phone]  DOCUMENTATION CODES Per approved criteria  -Severe malnutrition in the context of chronic illness -Obesity Unspecified    Derrell Lolling Anastasia Fiedler 05/15/2012, 1:03 PM

## 2012-05-15 NOTE — Consult Note (Signed)
#   [id number] is consult note/  Pete E.

## 2012-05-15 NOTE — Significant Event (Signed)
Pt noted to have bloody drainage from NG tube.  Has PE and DVT, but is s/p IVC filter.  Getting heparin gtt.  Will hold heparin GTT for 4 hours and then re-assess if/when to resume heparin.  Coralyn Helling, MD 05/15/2012, 4:11 PM [phone]

## 2012-05-15 NOTE — Progress Notes (Signed)
CARE MANAGEMENT NOTE 05/15/2012  Patient:  Robert Berger, Robert Berger   Account Number:  [id number]  Date Initiated:  05/15/2012  Documentation initiated by:  DAVIS,RHONDA  Subjective/Objective Assessment:   PATIENT WITH PERFORATED BOWEL REQUIRING OPEN EXP. LAP AND FORMATION OF COLOSTOMY, POST OP PATIENT UNABLE TO SUPPORT AIRWAY AND RESP STATUS INTUBATED AND BROUGHT TO ICU     Action/Plan:   FROM HOME   Anticipated DC Date:  05/18/2012   Anticipated DC Plan:  HOME/SELF CARE  In-house referral  NA      DC Planning Services  NA      [facility] Choice  NA   Choice offered to / List presented to:  NA   DME arranged  NA      DME agency  NA     HH arranged  NA      HH agency  NA   Status of service:  In process, will continue to follow Medicare Important Message given?  NA - LOS <3 / Initial given by admissions (If response is "NO", the following Medicare IM given date fields will be blank) Date Medicare IM given:   Date Additional Medicare IM given:    Discharge Disposition:    Per UR Regulation:  Reviewed for med. necessity/level of care/duration of stay  If discussed at Long Length of Stay Meetings, dates discussed:    Comments:  [id number]/Rhonda Earlene Plater, RN, BSN, CCM: CHART REVIEWED AND UPDATED. NO DISCHARGE NEEDS PRESENT AT THIS TIME. CASE MANAGEMENT [phone]

## 2012-05-15 NOTE — Progress Notes (Addendum)
ANTICOAGULATION CONSULT NOTE - Follow Up Consult  Pharmacy Consult for heparin Indication: pulmonary embolus and DVT  Allergies  Allergen Reactions  . Penicillins Swelling    Patient Measurements: Height: 5\' 10"  (177.8 cm) Weight: 212 lb 15.4 oz (96.6 kg) IBW/kg (Calculated) : 73  Heparin Dosing Weight:   Vital Signs: Temp: 99.1 F (37.3 C) (10/06 2000) Temp src: Oral (10/06 2000) BP: 108/79 mmHg (10/06 2321) Pulse Rate: 109  (10/06 2000)  Labs:  Basename 05/14/12 2245 05/14/12 1131 05/14/12 0530 05/13/12 1821 05/13/12 1740  HGB -- -- 10.3* -- 11.5*  HCT -- -- 30.2* -- 33.8*  PLT -- -- 111* -- 128*  APTT -- 28 -- -- --  LABPROT -- -- -- 15.3* --  INR -- -- -- 1.23 --  HEPARINUNFRC 0.43 -- -- -- --  CREATININE -- -- 0.38* 0.74 0.50  CKTOTAL -- -- -- -- --  CKMB -- -- -- -- --  TROPONINI -- -- -- <0.30 --    Estimated Creatinine Clearance: 98.7 ml/min (by C-G formula based on Cr of 0.38).   Medications:  Infusions:    . sodium chloride    . dextrose 5 % and 0.45 % NaCl with KCl 20 mEq/L 125 mL/hr at 05/14/12 1035  . fentaNYL infusion INTRAVENOUS 100 mcg/hr (05/14/12 1900)  . heparin 1,500 Units/hr (05/14/12 1900)  . midazolam (VERSED) infusion 3 mg/hr (05/14/12 2100)  . DISCONTD: sodium chloride Stopped (05/14/12 1038)    Assessment: Patient with heparin level at goal.  No issues per RN.  Goal of Therapy:  Heparin level 0.3-0.7 units/ml Monitor platelets by anticoagulation protocol: Yes   Plan:  Continue heparin at current rate, recheck level with am labs  Robert Berger 05/15/2012,12:21 AM    Addendum  42 yom with h/o MM presented 10/5 with SOB. CTA done showed bilateral PEs and free air in the abdomen thought d/t perforation of L lower abdomen. IVC filter placed 10/6, IV heparin started 6 hours s/p ex-lap, sigmoid colectomy, descending colostomy to start IV heparin with no bolus 6 hours post op. Bilateral DVT noted 10/6 also.    Heparin level has been therapeutic on Heparin 1500 units/hr. This AM heparin level at higher end of therapeutic - 0.69 (goal 0.3 - 0.7). Plt 123K 2/2 chemo, MM, critical illness.  No bleeding documented.  Will eventually transition to Coumadin and have IVC filter remove when more stable per CCM.  Plan: Reduce heparin to 1450 units/hr with f/u lab in AM.   Robert Berger, PharmD, BCPS Pager: [phone] 10:50 AM Pharmacy #: [phone]

## 2012-05-15 NOTE — Telephone Encounter (Signed)
Patients wife called and cx 05/24/12 appt due to patient being in the hospital.

## 2012-05-15 NOTE — Progress Notes (Signed)
General Surgery Note  LOS: 2 days  POD# 1 Room - 1227  Assessment/Plan: 1.   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  On Merrem/Vanc.  Remains intubated.  Ostomy looks okay.  Has NGT.  Wound closed.  2.  PE (pulmonary thromboembolism)   Doppler 05/13/2012 - bilateral DVT.  IVC filter placed by IR.  On IV heparin - level 0.69 - 05/15/2012  3.  Acute respiratory failure  4.  Thrombocytopenia  Plts - 123,000 - 05/15/2012 5.  HTN (hypertension)  6.  Multiple myeloma  Seen by Dr. Ross Marcus. 7. On steroids for multiple myeloma  Subjective:  Intubated, but opens eyes and responds. Objective:   Filed Vitals:   05/15/12 0800  BP: 119/80  Pulse: 109  Temp:   Resp: 27     Intake/Output from previous day:  10/06 0701 - 10/07 0700 In: 6081.5 [P.O.:1000; I.V.:4111.5; NG/GT:30; IV Piggyback:910] Out: 1500 [Urine:1250; Emesis/NG output:225; Blood:25]  Intake/Output this shift:  Total I/O In: 133 [I.V.:133] Out: 60 [Urine:60]   Physical Exam:   General: Bearded older WM who is intubated.    HEENT: Normal. Pupils equal. .   Lungs: Clear.   Abdomen: Some rhonchi.   Wound: Stapled.     Lab Results:    Basename 05/15/12 0435 05/14/12 0530  WBC 4.6 3.4*  HGB 9.4* 10.3*  HCT 28.0* 30.2*  PLT 123* 111*    BMET   Basename 05/15/12 0435 05/14/12 0530  NA 134* 135  K 4.2 4.2  CL 102 101  CO2 25 24  GLUCOSE 183* 188*  BUN 9 16  CREATININE <0.20* 0.38*  CALCIUM 7.1* 8.0*    PT/INR   Basename 05/13/12 1821  LABPROT 15.3*  INR 1.23    ABG   Basename 05/15/12 0355 05/14/12 1132  PHART 7.437 7.369  HCO3 24.0 20.8     Studies/Results:  Dg Chest 2 View (if Patient Has Fever And/or Copd)  05/13/2012  *RADIOLOGY REPORT*  Clinical Data: Back pain.  Shortness of breath.  History of multiple myeloma.  Unable to sit up.  CHEST - 2 VIEW  Comparison: 03/09/2012  Findings: Heart size is  normal.  There is increased mediastinal width.  Lungs are clear.  There is a large amount of free intraperitoneal air.  Findings are suspicious for a perforated bowel.  IMPRESSION:  1.  Large amount of free intraperitoneal air. 2.  Superior mediastinal widening, raising question of adenopathy. Further evaluation with CT of the chest, abdomen, and pelvis with contrast is suggested for further evaluation.  Critical test results telephoned to Dr. Rosalia Hammers at the time of interpretation on date 05/13/2012 at time 6:36 p.m.   Original Report Authenticated By: Patterson Hammersmith, M.D.    Ct Angio Chest Pe W/cm &/or Wo Cm  05/13/2012  *RADIOLOGY REPORT*  Clinical Data:  Shortness of breath and hypoxia.  Undergoing treatment for multiple myeloma.  Large amount of free peritoneal air seen on chest radiographs earlier today.  Wide mediastinum on the chest radiographs.  CT ANGIOGRAPHY CHEST CT ABDOMEN AND PELVIS WITH CONTRAST  Technique:  Multidetector CT imaging of the chest was performed using the standard protocol during bolus administration of intravenous contrast.  Multiplanar CT image reconstructions including MIPs were obtained to evaluate the vascular anatomy. Multidetector CT imaging of the abdomen and pelvis was performed using the standard protocol during bolus administration of intravenous contrast.  Contrast: OMNIPAQUE IOHEXOL 350 MG/ML SOLN  Comparison:  Chest CT dated 03/10/2012 and abdomen and pelvis CT dated 03/09/2012.  CTA CHEST  Findings:  Again demonstrated is enlargement of the left lobe of the thyroid gland containing a coarse calcification.  This is not included in its entirety.  Multiple bilateral pulmonary arterial filling defects.  Interval mild dependent atelectasis in the right lower lobe. There is also collapse of the superior segment of the right lower lobe. No lung masses or enlarged lymph nodes.  Separately described abdominal findings.  Prominent mediastinal fat, producing the appearance of  a widened mediastinum on the chest radiographs earlier today.  No adenopathy seen.  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  Interval kyphoplasty material in the T9, T10, T11 and T12 vertebral bodies with abnormal lucency in those vertebral bodies. There are approximately 20% compression deformities of the T11 and T12 vertebral bodies.  There is also abnormal lucency in the majority of the remainder of the thoracic vertebral bodies, most pronounced in the T6, T7 and T8 vertebral bodies.  There is also abnormal lucency in the manubrium.   Review of the MIP images confirms the above findings.  IMPRESSION:  1.  Multiple bilateral pulmonary emboli. 2.  Changes of multiple myeloma in the thoracic spine and manubrium, as described above. 3.  Mild right lower lobe atelectasis and collapse of the superior segment the right lower lobe.  CT ABDOMEN AND PELVIS  Findings: Large amount of free peritoneal air.  There are some minimally dilated proximal small bowel loops with some fecalization of the bowel contents.  There is also a small amount of extraluminal fluid adjacent to the small bowel and colon in the left lower abdomen, best seen on coronal image number 59.  Lumbar spine degenerative changes.  Small amount of lucency in the left iliac bone.  Distended gallbladder with no gallbladder wall thickening or pericholecystic fluid.  No visible gallstones.  Unremarkable liver, spleen, pancreas, adrenal glands, kidneys and urinary bladder. Mildly enlarged prostate gland.  Small bilateral inguinal hernias containing fat and free peritoneal air.  No enlarged lymph nodes.  Review of the MIP images confirms the above findings.  IMPRESSION:  1.  Large amount of free peritoneal air, most likely due to perforation in the left lower abdomen, either small bowel or colon in that region, with a small amount of associated free peritoneal fluid. 2.  Mild partial small bowel obstruction. 3.  Small  bilateral inguinal hernias containing fat and free peritoneal air. 4.  Changes of multiple myeloma involving the left iliac bone.  Critical Value/emergent results were called by telephone at the time of interpretation on 05/13/2012 at 2024 hours to Dr. Rosalia Hammers, who verbally acknowledged these results.   Original Report Authenticated By: Darrol Angel, M.D.    Ct Abdomen Pelvis W Contrast  05/13/2012  *RADIOLOGY REPORT*  Clinical Data:  Shortness of breath and hypoxia.  Undergoing treatment for multiple myeloma.  Large amount of free peritoneal air seen on chest radiographs earlier today.  Wide mediastinum on the chest radiographs.  CT ANGIOGRAPHY CHEST CT ABDOMEN AND PELVIS WITH CONTRAST  Technique:  Multidetector CT imaging of the chest was performed using the standard protocol during bolus administration of intravenous contrast.  Multiplanar CT image reconstructions including MIPs were obtained to evaluate the vascular anatomy. Multidetector CT imaging of the abdomen and pelvis was performed using the standard protocol during bolus administration of intravenous contrast.  Contrast: OMNIPAQUE IOHEXOL 350 MG/ML SOLN  Comparison:  Chest CT dated 03/10/2012 and abdomen and pelvis CT dated 03/09/2012.  CTA CHEST  Findings:  Again demonstrated is enlargement of the left lobe of the thyroid gland containing a coarse calcification.  This is not included in its entirety.  Multiple bilateral pulmonary arterial filling defects.  Interval mild dependent atelectasis in the right lower lobe. There is also collapse of the superior segment of the right lower lobe. No lung masses or enlarged lymph nodes.  Separately described abdominal findings.  Prominent mediastinal fat, producing the appearance of a widened mediastinum on the chest radiographs earlier today.  No adenopathy seen.  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  Interval kyphoplasty material in the T9, T10, T11 and T12  vertebral bodies with abnormal lucency in those vertebral bodies. There are approximately 20% compression deformities of the T11 and T12 vertebral bodies.  There is also abnormal lucency in the majority of the remainder of the thoracic vertebral bodies, most pronounced in the T6, T7 and T8 vertebral bodies.  There is also abnormal lucency in the manubrium.   Review of the MIP images confirms the above findings.  IMPRESSION:  1.  Multiple bilateral pulmonary emboli. 2.  Changes of multiple myeloma in the thoracic spine and manubrium, as described above. 3.  Mild right lower lobe atelectasis and collapse of the superior segment the right lower lobe.  CT ABDOMEN AND PELVIS  Findings: Large amount of free peritoneal air.  There are some minimally dilated proximal small bowel loops with some fecalization of the bowel contents.  There is also a small amount of extraluminal fluid adjacent to the small bowel and colon in the left lower abdomen, best seen on coronal image number 59.  Lumbar spine degenerative changes.  Small amount of lucency in the left iliac bone.  Distended gallbladder with no gallbladder wall thickening or pericholecystic fluid.  No visible gallstones.  Unremarkable liver, spleen, pancreas, adrenal glands, kidneys and urinary bladder. Mildly enlarged prostate gland.  Small bilateral inguinal hernias containing fat and free peritoneal air.  No enlarged lymph nodes.  Review of the MIP images confirms the above findings.  IMPRESSION:  1.  Large amount of free peritoneal air, most likely due to perforation in the left lower abdomen, either small bowel or colon in that region, with a small amount of associated free peritoneal fluid. 2.  Mild partial small bowel obstruction. 3.  Small bilateral inguinal hernias containing fat and free peritoneal air. 4.  Changes of multiple myeloma involving the left iliac bone.  Critical Value/emergent results were called by telephone at the time of interpretation on  05/13/2012 at 2024 hours to Dr. Rosalia Hammers, who verbally acknowledged these results.   Original Report Authenticated By: Darrol Angel, M.D.    Ir Ivc Filter Plmt / S&i /img Guid/mod Sed  05/14/2012  *RADIOLOGY REPORT*  Clinical Data:  Acute pulmonary embolism and perforated bowel.  The patient requires IVC filter emergently prior to planned laparotomy for bowel perforation.  1.  ULTRASOUND GUIDANCE FOR VASCULAR ACCESS OF THE RIGHT INTERNAL JUGULAR VEIN. 2.  IVC VENOGRAM 3.  PERCUTANEOUS IVC FILTER PLACEMENT  Sedation:  0.5 mg IV Versed; 25 mcg IV Fentanyl.  Total Moderate Sedation Time:  15 minutes.  Contrast:  35 ml Omnipaque-300  Fluoroscopy Time: 1.2 minutes.  Procedure:  The procedure, risks, benefits, and alternatives were explained to the patient.  Questions regarding the procedure were encouraged and answered.  The patient understands  and consents to the procedure.  The right neck was prepped with Betadine in a sterile fashion, and a sterile drape was applied covering the operative field.  A sterile gown and sterile gloves were used for the procedure.  Local anesthesia was provided with 1% Lidocaine.  Under direct ultrasound guidance, a 21 gauge needle was advanced into the right internal jugular vein with ultrasound image documentation performed.  After securing access with a micropuncture dilator, a guidewire was advanced into the inferior vena cava.  A deployment sheath was advanced over the guidewire. This was utilized to perform IVC venography.  The deployment sheath was further positioned in an appropriate location for filter deployment.  A Cook Celect IVC filter was then advanced in the sheath.  This was then fully deployed in the infrarenal IVC.  Final filter position was confirmed with a fluoroscopic spot image.  Contrast injection was also performed through the sheath under fluoroscopy to confirm patency of the IVC at the level of the filter.  After the procedure the sheath was removed and hemostasis  obtained with manual compression.  Complications: None  Findings:  IVC venography demonstrates a normal caliber IVC with no evidence of thrombus.  Renal veins are identified bilaterally.  The IVC filter was successfully positioned below the level of the renal veins and is appropriately oriented.  This IVC filter has both permanent and retrievable indications.  IMPRESSION:  Placement of percutaneous IVC filter in infrarenal IVC.  IVC venogram shows no evidence of IVC thrombus and normal caliber of the inferior vena cava.  This filter does have both permanent and retrievable indications.   Original Report Authenticated By: Reola Calkins, M.D.    Dg Chest Port 1 View  05/15/2012  *RADIOLOGY REPORT*  Clinical Data: 71 year old male atelectasis.  Respiratory failure.  PORTABLE CHEST - 1 VIEW  Comparison: 05/14/2012 and earlier.  Findings: Portable semi upright AP view 0439 hours.  Endotracheal tube tip projects now just above the level of clavicles.  Enteric tube courses to the abdomen, tip not included.  Stable right subclavian central line. Continued low lung volumes.  Stable cardiac size and mediastinal contours.  No pneumothorax or pulmonary edema.  No definite effusion.  Mildly increased perihilar and basilar opacity. Multilevel spinal vertebroplasty changes.  IMPRESSION: 1.  Endotracheal tube tip now just above the level of clavicles. Otherwise, stable lines and tubes. 2.  Very low lung volumes with perihilar and basilar opacity which most resembles atelectasis.   Original Report Authenticated By: [name], M.D.    Dg Chest Port 1 View  05/14/2012  *RADIOLOGY REPORT*  Clinical Data: Status post intubation and laparotomy this morning for colonic perforation.  Acute pulmonary embolism.  PORTABLE CHEST - 1 VIEW  Comparison: 05/13/2012  Findings: Central line positioning stable with the tip in the right atrium.  Endotracheal tube tip approximately 2 cm above the carina. Nasogastric tube extends below  the diaphragm.  Lungs show low volumes with mild bibasilar atelectasis.  No edema, pneumothorax or significant pleural effusion identified.  Heart size is normal.  IMPRESSION: Satisfactory positioning of endotracheal tube with tip approximately 2 cm above the carina.  The central line tip remains in the right atrium.   Original Report Authenticated By: Reola Calkins, M.D.    Dg Chest Port 1 View  05/13/2012  *RADIOLOGY REPORT*  Clinical Data: Central line placement  PORTABLE CHEST - 1 VIEW  Comparison: Earlier film of the same day  Findings: Right subclavian central line extends  to the mid right atrium.  No pneumothorax.  Low lung volumes.  Free air under the right diaphragmatic leaflet is again noted.  Normal heart size.  No effusion. Changes of vertebral augmentation at multiple lower thoracic levels as before.  IMPRESSION: 1.  Central line to mid right atrium without pneumothorax. 2.  Persistent free intraperitoneal gas.   Original Report Authenticated By: Osa Craver, M.D.      Anti-infectives:   Anti-infectives     Start     Dose/Rate Route Frequency Ordered Stop   05/14/12 1200   vancomycin (VANCOCIN) IVPB 1000 mg/200 mL premix        1,000 mg 200 mL/hr over 60 Minutes Intravenous Every 8 hours 05/14/12 1115     05/13/12 2230   meropenem (MERREM) 1 g in sodium chloride 0.9 % 100 mL IVPB        1 g 200 mL/hr over 30 Minutes Intravenous 3 times per day 05/13/12 2158     05/13/12 2100   levofloxacin (LEVAQUIN) IVPB 750 mg  Status:  Discontinued        750 mg 100 mL/hr over 90 Minutes Intravenous Every 24 hours 05/13/12 2050 05/13/12 2155   05/13/12 2100   metroNIDAZOLE (FLAGYL) IVPB 500 mg  Status:  Discontinued        500 mg 100 mL/hr over 60 Minutes Intravenous Every 8 hours 05/13/12 2050 05/13/12 2155          Ovidio Kin, MD, FACS Pager: [phone],   [facility] Office: [phone] 05/15/2012

## 2012-05-15 NOTE — Progress Notes (Signed)
CRITICAL VALUE ALERT  Critical value received:  Positive Blood culture  Date of notification:  05/15/12  Time of notification:  2000  Critical value read back:yes  Nurse who received alert:  Kinnie Feil, RN  MD notified (1st page):  Pola Corn MD  Time of first page:  2024  MD notified (2nd page):  Time of second page:  Responding MD:  Pola Corn MD  Time MD responded:  2024, no new orders received.

## 2012-05-16 ENCOUNTER — Inpatient Hospital Stay (HOSPITAL_COMMUNITY): Payer: BC Managed Care – PPO

## 2012-05-16 DIAGNOSIS — C9000 Multiple myeloma not having achieved remission: Secondary | ICD-10-CM

## 2012-05-16 LAB — BASIC METABOLIC PANEL
CO2: 24 mEq/L (ref 19–32)
Calcium: 6.6 mg/dL — ABNORMAL LOW (ref 8.4–10.5)
GFR calc non Af Amer: >90 mL/min (ref >90)
Potassium: 3.5 mEq/L (ref 3.5–5.1)
Sodium: 137 mEq/L (ref 135–145)

## 2012-05-16 LAB — CBC
HCT: 27.1 % — ABNORMAL LOW (ref 39.0–52.0)
Platelets: 121 10*3/uL — ABNORMAL LOW (ref 150–400)
RBC: 2.86 MIL/uL — ABNORMAL LOW (ref 4.22–5.81)
RDW: 17.3 % — ABNORMAL HIGH (ref 11.5–15.5)
WBC: 3.8 10*3/uL — ABNORMAL LOW (ref 4.0–10.5)

## 2012-05-16 LAB — GLUCOSE, CAPILLARY
Glucose-Capillary: 117 mg/dL — ABNORMAL HIGH (ref 70–99)
Glucose-Capillary: 134 mg/dL — ABNORMAL HIGH (ref 70–99)

## 2012-05-16 LAB — HEPARIN LEVEL (UNFRACTIONATED): Heparin Unfractionated: <0.1 IU/mL — ABNORMAL LOW (ref 0.30–0.70)

## 2012-05-16 MED ORDER — DILTIAZEM LOAD VIA INFUSION
20.0000 mg | Freq: Once | INTRAVENOUS | Status: AC
  Administered 2012-05-16: 20 mg via INTRAVENOUS
  Filled 2012-05-16: qty 20

## 2012-05-16 MED ORDER — POTASSIUM CHLORIDE 10 MEQ/50ML IV SOLN
10.0000 meq | INTRAVENOUS | Status: AC
  Administered 2012-05-16 (×4): 10 meq via INTRAVENOUS
  Filled 2012-05-16 (×4): qty 50

## 2012-05-16 MED ORDER — HEPARIN (PORCINE) IN NACL 100-0.45 UNIT/ML-% IJ SOLN
1400.0000 [IU]/h | INTRAMUSCULAR | Status: DC
  Administered 2012-05-16 (×2): 1400 [IU]/h via INTRAVENOUS
  Filled 2012-05-16 (×2): qty 250

## 2012-05-16 MED ORDER — BIOTENE DRY MOUTH MT LIQD
15.0000 mL | Freq: Four times a day (QID) | OROMUCOSAL | Status: DC
  Administered 2012-05-16 – 2012-05-19 (×11): 15 mL via OROMUCOSAL

## 2012-05-16 MED ORDER — DILTIAZEM HCL 100 MG IV SOLR
5.0000 mg/h | INTRAVENOUS | Status: DC
  Administered 2012-05-16: 10 mg/h via INTRAVENOUS
  Administered 2012-05-16: 5 mg/h via INTRAVENOUS
  Administered 2012-05-17: 10 mg/h via INTRAVENOUS
  Administered 2012-05-17: 5 mg/h via INTRAVENOUS
  Filled 2012-05-16: qty 100

## 2012-05-16 MED ORDER — HYDROCORTISONE SOD SUCCINATE 100 MG IJ SOLR
50.0000 mg | Freq: Three times a day (TID) | INTRAMUSCULAR | Status: AC
  Administered 2012-05-16: 50 mg via INTRAVENOUS

## 2012-05-16 MED ORDER — PANTOPRAZOLE SODIUM 40 MG IV SOLR
40.0000 mg | Freq: Two times a day (BID) | INTRAVENOUS | Status: DC
  Administered 2012-05-16 – 2012-05-24 (×17): 40 mg via INTRAVENOUS
  Filled 2012-05-16 (×20): qty 40

## 2012-05-16 MED ORDER — FUROSEMIDE 10 MG/ML IJ SOLN
40.0000 mg | Freq: Once | INTRAMUSCULAR | Status: AC
  Administered 2012-05-16: 40 mg via INTRAVENOUS
  Filled 2012-05-16: qty 4

## 2012-05-16 NOTE — Progress Notes (Addendum)
ANTICOAGULATION + ANTIBIOTIC CONSULT NOTE - Follow Up Consult  Pharmacy Consult for IV heparin Indication: PE, bilateral DVTs  Pharmacy Consult for Meropenem Indication: peritonitis from sigmoid colon perforation  Allergies  Allergen Reactions  . Penicillins Swelling   Labs:  Basename 05/16/12 0407 05/15/12 0435 05/14/12 2245 05/14/12 1131 05/14/12 0530 05/13/12 1821  HGB 9.1* 9.4* -- -- -- --  HCT 27.1* 28.0* -- -- 30.2* --  PLT 121* 123* -- -- 111* --  APTT -- -- -- 28 -- --  LABPROT -- -- -- -- -- 15.3*  INR -- -- -- -- -- 1.23  HEPARINUNFRC <0.10* 0.69 0.43 -- -- --  CREATININE 0.32* <0.20* -- -- 0.38* --  CKTOTAL -- -- -- -- -- --  CKMB -- -- -- -- -- --  TROPONINI -- -- -- -- -- <0.30   Assessment:  Anticoagulation  71 yom with new bilateral PEs and bilateral DVTs s/p IVC filter placement and ex-lap, sigmoid colectomy, descending colostomy 10/6.  Pt was started on IV heparin post-op and heparin level has been therapeutic since IV heparin initiation.    10/7PM, RN noted bloody drainage from NGT.  CCM asked to hold IV heparin for 4 hours and re-assess.  IV heparin was resumed at 8PM with reoccurrence of bloody drainage.  IV heparin currently on hold.  Heparin level this morning undetectable as expected.    Stable thrombocytopenia, Hgb 9.1.  Assessment: Antibiotics  Pt is on D#4 of Meropenem for abd coverage, likely peritonitis s/p bowel perf.  Vancomycin d/c'd yesterday.  Afeb, WBC low (recent chemo), Scr low, CrCl > 100 ml/min  One of 2 blood cultures from 10/5 growing gram positive rods and gram positive cocci in clusters.  Pt is noted to have had MSSA infection (arm wound) - cultured 04/25/2012.  Goal of Therapy: Anticoagulation Heparin level 0.3-0.7 units/ml Monitor platelets by anticoagulation protocol: Yes   Plan: Anticoagulation  Continue to hold IV heparin, await MD reassessment.  Pharmacy will f/u for resume or to discontinue daily heparin levels if  plan holding   Plan: Antibiotic  Continue Meropenem 1gm IV q8h  Will defer to MD to broaden antibiotic to r/o MRSA bacteremia given h/o recent MSSA infection - today's day #4 of hospitalization.  [name] Thi 05/16/2012,7:13 AM    Addendum  Received verbal order from Dr.Sood (CCM) to resume IV heparin at this time.    Plan : Resume heparin at 1400 units/hr.  Heparin level at 1900 tonight.  Pharmacy will f/u.  [name], PharmD, BCPS Pager: [phone] 11:08 AM Pharmacy #: [phone]   PM Heparin Level Addendum 1900 heparin level therapeutic.  Spoke with RN who stated no current bleeding or complications.  Will continue heparin at current rate of 1400 units/hr.    Leaann Nevils, Loma Messing PharmD Pager #: [phone] 7:57 PM 05/16/2012

## 2012-05-16 NOTE — Progress Notes (Signed)
Name: Robert Berger MRN: [id number] DOB: 1941-03-04    LOS: 3  Referring Provider:  Dr. Rosalia Hammers Reason for Referral:  PE, bowel perforation  PULMONARY / CRITICAL CARE MEDICINE  HPI:  71 yo male admitted 05/13/2012 dyspnea from b/l PE and perforated sigmoid colon.  PCCM asked to admit.  Remained on vent post-op. PMHx MM, chronic pain, HTN  Events: 10/07 Bloody NG tube outpt 10/08 SVT  Vital Signs: Temp:  [97.8 F (36.6 C)-100 F (37.8 C)] 98.9 F (37.2 C) (10/08 0400) Pulse Rate:  [88-117] 113  (10/08 0335) Resp:  [13-18] 14  (10/08 0600) BP: (85-115)/(43-76) 93/69 mmHg (10/08 0600) SpO2:  [93 %-100 %] 100 % (10/08 0600) FiO2 (%):  [30 %] 30 % (10/08 0759) Weight:  [226 lb 13.7 oz (102.9 kg)] 226 lb 13.7 oz (102.9 kg) (10/08 0600)  Physical Examination: General - no distress HEENT - ETT, NG tube in place Cardiac - s1s2, tachycardic Chest - scattered rhonchi Abd - soft, mild tenderness Ext - 1+ edema Neuro - sedated  Dg Chest Port 1 View  05/16/2012  *RADIOLOGY REPORT*  Clinical Data: 71 year old male with respiratory failure.  Altered mental status.  PORTABLE CHEST - 1 VIEW  Comparison: 05/15/2012 and earlier.  Findings: Portable semi upright AP view 0455 hours.  The patient is more rotated to the right.  Endotracheal tube tip projects higher above the level of clavicles, but this view is also more kyphotic. Enteric tube courses to the abdomen.  Grossly stable right subclavian central line.  Continued very low lung volumes.  Stable retrocardiac and perihilar opacity.  No pneumothorax or large effusion.  Sequelae of spinal vertebroplasty.  IMPRESSION: 1.  Rotated and more kyphotic view.  This might explain the apparently higher position of the endotracheal tube. Clinical correlation recommended. 2. Otherwise, stable lines and tubes. 3. Continued very low lung volumes.   Original Report Authenticated By: [name], M.D.    Dg Chest Port 1 View  05/15/2012  *RADIOLOGY REPORT*   Clinical Data: 71 year old male atelectasis.  Respiratory failure.  PORTABLE CHEST - 1 VIEW  Comparison: 05/14/2012 and earlier.  Findings: Portable semi upright AP view 0439 hours.  Endotracheal tube tip projects now just above the level of clavicles.  Enteric tube courses to the abdomen, tip not included.  Stable right subclavian central line. Continued low lung volumes.  Stable cardiac size and mediastinal contours.  No pneumothorax or pulmonary edema.  No definite effusion.  Mildly increased perihilar and basilar opacity. Multilevel spinal vertebroplasty changes.  IMPRESSION: 1.  Endotracheal tube tip now just above the level of clavicles. Otherwise, stable lines and tubes. 2.  Very low lung volumes with perihilar and basilar opacity which most resembles atelectasis.   Original Report Authenticated By: [name], M.D.    Dg Chest Port 1 View  05/14/2012  *RADIOLOGY REPORT*  Clinical Data: Status post intubation and laparotomy this morning for colonic perforation.  Acute pulmonary embolism.  PORTABLE CHEST - 1 VIEW  Comparison: 05/13/2012  Findings: Central line positioning stable with the tip in the right atrium.  Endotracheal tube tip approximately 2 cm above the carina. Nasogastric tube extends below the diaphragm.  Lungs show low volumes with mild bibasilar atelectasis.  No edema, pneumothorax or significant pleural effusion identified.  Heart size is normal.  IMPRESSION: Satisfactory positioning of endotracheal tube with tip approximately 2 cm above the carina.  The central line tip remains in the right atrium.   Original Report Authenticated By: Sherrine Maples  T. Fredia Sorrow, M.D.     ASSESSMENT AND PLAN  PULMONARY  Lab 05/15/12 0355 05/14/12 1132  PHART 7.437 7.369  PCO2ART 36.3 36.9  PO2ART 74.9* 195.0*  HCO3 24.0 20.8  O2SAT 97.2 99.3   ETT:  10/06>>  CT chest 10/05>>Multiple b/l PE, changes of MM in thoracic spine and manubrium, basilar ATX Doppler legs 10/06>>Acute DVT Rt posterior tibial  and peroneal veins, and Lt common femoral, saphenofemoral junction, and posterior tibial veins.  A:  Acute respiratory failure 2nd to PE and perforated sigmoid colon.  S/p IVC by IR 10/06. P:   Full vent support until more stable F/u CXR Titrate oxygen to keep SpO2 > 92% Continue heparin gtt>>will eventually need to transition to coumadin, and have IV filter removed when more stable Will need to f/u doppler legs week of 10/14  CARDIOVASCULAR  Lab 05/13/12 1821  TROPONINI <0.30  LATICACIDVEN --  PROBNP --   Lines:  Rt [place] CVL 10/05>>  Echo 10/07>>mod LV systolic dysfx, mild LVH, nl RV systolic fx  A: Hx of HTN. SVT>>new 10/08. Volume overload. P:  Diltiazem protocol>>goal HR < 105   RENAL  Lab 05/16/12 0407 05/15/12 0435 05/14/12 0530 05/13/12 1821 05/13/12 1740  NA 137 134* 135 132* 133*  K 3.5 4.2 -- -- --  CL 104 102 101 97 97  CO2 24 25 24 25 22   BUN 9 9 16 16 23   CREATININE 0.32* <0.20* 0.38* 0.74 0.50  CALCIUM 6.6* 7.1* 8.0* 9.6 8.6  MG -- 2.2 2.4 -- --  PHOS -- 2.1* 3.4 -- --   Intake/Output      10/07 0701 - 10/08 0700 10/08 0701 - 10/09 0700   P.O.     I.V. (mL/kg) 3377.9 (32.8)    Other     NG/GT 210    IV Piggyback 310    Total Intake(mL/kg) 3897.9 (37.9)    Urine (mL/kg/hr) 1305 (0.5)    Emesis/NG output 200    Blood     Total Output 1505    Net +2392.9           Intake/Output Summary (Last 24 hours) at 05/16/12 0821 Last data filed at 05/16/12 0600  Gross per 24 hour  Intake 3734.86 ml  Output   1445 ml  Net 2289.86 ml   Foley:  none  A: Hypovolemia>>resolved. P:   Keep in negative fluid balance Decrease IV fluid rate to 50 ml/hr F/u renal fx, urine outpt, electrolytes  GASTROINTESTINAL  Lab 05/13/12 1821 05/10/12 1112  AST 11 43*  ALT 6 --  ALKPHOS 99 113*  BILITOT 0.4 1.10  PROT 6.8 5.9*  ALBUMIN 3.3* --   CT abd/pelvis 10/05>>free air, partial SBO, b/l inguinal hernias, changes of MM Lt iliac bone  A:  Perforated  sigmoid colon likely 2nd to severe constipation from opiate use. Bloody NG tube outpt>>appears to have decreased P:   Post-op care and nutrition per CCS Change protonix to BID while continuing heparin If bleeding persists, then may need GI to do EGD  HEMATOLOGIC  Lab 05/16/12 0407 05/15/12 0435 05/14/12 1131 05/14/12 0530 05/13/12 1821 05/13/12 1740 05/10/12 1016  HGB 9.1* 9.4* -- 10.3* -- 11.5* 10.6*  HCT 27.1* 28.0* -- 30.2* -- 33.8* 30.8*  PLT 121* 123* -- 111* -- 128* 98*  INR -- -- -- -- 1.23 -- --  APTT -- -- 28 -- -- -- --   A:  Anemia and thrombocytopenia in setting of multiple myeloma and critical illness.  Followed by Dr. Myna Hidalgo as outpt.  On zometa and velcade with decadron as outpt. P:  F/u CBC Transfuse for Hb < 7  INFECTIOUS  Lab 05/16/12 0407 05/15/12 0435 05/14/12 0530 05/13/12 1740 05/10/12 1016  WBC 3.8* 4.6 3.4* 5.8 4.8  PROCALCITON -- -- -- -- --   Cultures: Blood 10/05>>  Antibiotics: Levaquin 10/05>>10/05 Meropenem 10/05>> Flagyl 10/05>>10/05 Vancomycin 10/05>>10/07  A: Peritonitis from sigmoid colon perforation. 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 Hyperglycemia. P:   Continue solucortef>>change to 50 mg q8h SSI  NEUROLOGIC  A: Chronic pain. P:   Sedation protocol while on vent  Critical care time 40 minutes  Coralyn Helling, MD [facility] Pulmonary/Critical Care 05/16/2012, 8:21 AM Pager:  [phone] After 3pm call: [phone]

## 2012-05-16 NOTE — Progress Notes (Signed)
Patient ID: Robert Berger, male   DOB: 10/27/1940, 71 y.o.   MRN: [id number] 2 Days Post-Op  Subjective: Pt is somewhat alert on the vent.  He doesn't have much abdominal pain.  Mother is at the bedside.  Objective: Vital signs in last 24 hours: Temp:  [97.8 F (36.6 C)-100 F (37.8 C)] 98.7 F (37.1 C) (10/08 0800) Pulse Rate:  [88-114] 114  (10/08 1000) Resp:  [14-18] 18  (10/08 1200) BP: (85-141)/(43-88) 134/87 mmHg (10/08 1200) SpO2:  [96 %-100 %] 100 % (10/08 1200) FiO2 (%):  [30 %] 30 % (10/08 1200) Weight:  [226 lb 13.7 oz (102.9 kg)] 226 lb 13.7 oz (102.9 kg) (10/08 0600) Last BM Date: 05/12/12  Intake/Output from previous day: 10/07 0701 - 10/08 0700 In: 4019.9 [I.V.:3499.9; NG/GT:210; IV Piggyback:310] Out: 1505 [Urine:1305; Emesis/NG output:200] Intake/Output this shift: Total I/O In: 643 [I.V.:493; IV Piggyback:150] Out: 765 [Urine:765]  PE: Abd: soft, absent BS, NGt with bilious output.  Ostomy with no output, stoma is pink.  Midline wound is clean with staples.  Lab Results:   Basename 05/16/12 0407 05/15/12 0435  WBC 3.8* 4.6  HGB 9.1* 9.4*  HCT 27.1* 28.0*  PLT 121* 123*   BMET  Basename 05/16/12 0407 05/15/12 0435  NA 137 134*  K 3.5 4.2  CL 104 102  CO2 24 25  GLUCOSE 138* 183*  BUN 9 9  CREATININE 0.32* <0.20*  CALCIUM 6.6* 7.1*   PT/INR  Basename 05/13/12 1821  LABPROT 15.3*  INR 1.23   CMP     Component Value Date/Time   NA 137 05/16/2012 0407   NA 137 05/10/2012 1112   K 3.5 05/16/2012 0407   K 5.1* 05/10/2012 1112   CL 104 05/16/2012 0407   CL 96* 05/10/2012 1112   CO2 24 05/16/2012 0407   CO2 29 05/10/2012 1112   GLUCOSE 138* 05/16/2012 0407   GLUCOSE 193* 05/10/2012 1112   BUN 9 05/16/2012 0407   BUN 13 05/10/2012 1112   CREATININE 0.32* 05/16/2012 0407   CREATININE 0.5* 05/10/2012 1112   CALCIUM 6.6* 05/16/2012 0407   CALCIUM 8.5 05/10/2012 1112   PROT 6.8 05/13/2012 1821   PROT 5.9* 05/10/2012 1112   ALBUMIN 3.3* 05/13/2012 1821   AST 11 05/13/2012 1821   AST 43* 05/10/2012 1112   ALT 6 05/13/2012 1821   ALKPHOS 99 05/13/2012 1821   ALKPHOS 113* 05/10/2012 1112   BILITOT 0.4 05/13/2012 1821   BILITOT 1.10 05/10/2012 1112   GFRNONAA >90 05/16/2012 0407   GFRAA >90 05/16/2012 0407   Lipase     Component Value Date/Time   LIPASE 28 03/09/2012 1100       Studies/Results: Dg Chest Port 1 View  05/16/2012  *RADIOLOGY REPORT*  Clinical Data: 71 year old male with respiratory failure.  Altered mental status.  PORTABLE CHEST - 1 VIEW  Comparison: 05/15/2012 and earlier.  Findings: Portable semi upright AP view 0455 hours.  The patient is more rotated to the right.  Endotracheal tube tip projects higher above the level of clavicles, but this view is also more kyphotic. Enteric tube courses to the abdomen.  Grossly stable right subclavian central line.  Continued very low lung volumes.  Stable retrocardiac and perihilar opacity.  No pneumothorax or large effusion.  Sequelae of spinal vertebroplasty.  IMPRESSION: 1.  Rotated and more kyphotic view.  This might explain the apparently higher position of the endotracheal tube. Clinical correlation recommended. 2. Otherwise, stable lines and tubes. 3. Continued very  low lung volumes.   Original Report Authenticated By: [name], M.D.    Dg Chest Port 1 View  05/15/2012  *RADIOLOGY REPORT*  Clinical Data: 70 year old male atelectasis.  Respiratory failure.  PORTABLE CHEST - 1 VIEW  Comparison: 05/14/2012 and earlier.  Findings: Portable semi upright AP view 0439 hours.  Endotracheal tube tip projects now just above the level of clavicles.  Enteric tube courses to the abdomen, tip not included.  Stable right subclavian central line. Continued low lung volumes.  Stable cardiac size and mediastinal contours.  No pneumothorax or pulmonary edema.  No definite effusion.  Mildly increased perihilar and basilar opacity. Multilevel spinal vertebroplasty changes.  IMPRESSION: 1.  Endotracheal tube  tip now just above the level of clavicles. Otherwise, stable lines and tubes. 2.  Very low lung volumes with perihilar and basilar opacity which most resembles atelectasis.   Original Report Authenticated By: [name], M.D.     Anti-infectives: Anti-infectives     Start     Dose/Rate Route Frequency Ordered Stop   05/14/12 1200   vancomycin (VANCOCIN) IVPB 1000 mg/200 mL premix  Status:  Discontinued        1,000 mg 200 mL/hr over 60 Minutes Intravenous Every 8 hours 05/14/12 1115 05/15/12 1038   05/13/12 2230   meropenem (MERREM) 1 g in sodium chloride 0.9 % 100 mL IVPB        1 g 200 mL/hr over 30 Minutes Intravenous 3 times per day 05/13/12 2158     05/13/12 2100   levofloxacin (LEVAQUIN) IVPB 750 mg  Status:  Discontinued        750 mg 100 mL/hr over 90 Minutes Intravenous Every 24 hours 05/13/12 2050 05/13/12 2155   05/13/12 2100   metroNIDAZOLE (FLAGYL) IVPB 500 mg  Status:  Discontinued        500 mg 100 mL/hr over 60 Minutes Intravenous Every 8 hours 05/13/12 2050 05/13/12 2155           Assessment/Plan 1. EXPLORATORY LAPAROTOMY, LAPAROSCOPIC SIGMOID COLECTOMY, COLOSTOMY - T. 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   On Merrem/Vanc.   Remains intubated.   2. VDRF 3. PE (pulmonary thromboembolism)   Doppler 05/13/2012 - bilateral DVT.   IVC filter placed by IR.   On IV heparin - level <0.10 - 05/16/2012  4. Thrombocytopenia   Plts - 121,000 - 05/16/2012  5. HTN (hypertension)  6. Multiple myeloma   Seen by Dr. Ross Marcus.  7. On steroids for multiple myeloma  Plan: 1. Cont NPO and bowel rest.  Await return of bowel function. 2. Discussed care with mother and all questions answered. 3. Cont abx therapy    LOS: 3 days    OSBORNE,KELLY E 05/16/2012, 12:32 PM Pager: [phone]  Wound okay.  Will remove 1/2 staples.  Still with ileus.  Possibly extubate tomorrow.  Ovidio Kin, MD, [facility] Surgery Pager: [phone] Office phone:   [phone]

## 2012-05-16 NOTE — Progress Notes (Signed)
eLink Physician-Brief Progress Note Patient Name: Robert Berger DOB: [birth year] MRN: [id number]  Date of Service  05/16/2012   HPI/Events of Note  Patient with PE s/p IVC filter on heparin gtt.  Had episode of bloody NGT drainage earlier in PM shift.  Dr. Craige Cotta instructed nurse to hold heparin gtt.  Gtt was held for 4 hours and then resumed at 8pm.  Now with reoccurrence of NGT bloody drainage.  eICU Interventions  Plan: Hold heparin for now. Double protonix dose May need GI evaluation   Intervention Category Intermediate Interventions: Bleeding - evaluation and treatment with blood products  DETERDING,ELIZABETH 05/16/2012, 1:06 AM

## 2012-05-16 NOTE — Progress Notes (Signed)
CRITICAL VALUE ALERT  Critical value received:  POsitive blood cultures  Date of notification:  05/16/12  Time of notification:  0532  Critical value read back:yes  Nurse who received alert:  Kinnie Feil, RN  MD notified (1st page):  Deterding, Pola Corn MD  Time of first page:  0535  MD notified (2nd page):  Time of second page:  Responding MD:  Audelia Hives MD  Time MD responded:  [phone]

## 2012-05-16 NOTE — Progress Notes (Signed)
05/16/12 @ 1700  Dr. Ezzard Standing in to see pt., verbal orders given to remove every other staple from abd incision and cover with light gauze dressing.  Every other staple removed, approx half way through pt motioned that he was in pain, Fentanyl 50mg  IV bolus given, pt dosed off during rest of procedure.  No drainage noted from incision. Skin edges remain approximated.  Incision coved with few sterile 4x4s folded in half down the incision length and then taped in place.  Pt currently has CPOT of 0.

## 2012-05-16 NOTE — Consult Note (Signed)
NAMEMarland Kitchen  Robert Berger, Robert Berger NO.:  [id number]  MEDICAL RECORD NO.:  [id number]  LOCATION:  1227                         FACILITY:  [facility]  PHYSICIAN:  [name], M.D.  DATE OF BIRTH:  02-Jan-1941  DATE OF CONSULTATION: DATE OF DISCHARGE:                                CONSULTATION   REFERRING PHYSICIAN:  Velora Heckler, MD  REASON FOR CONSULTATION: 1. IgG kappa myeloma. 2. Perforated diverticulum. 3. Pulmonary embolism/deep venous thrombosis.  HISTORY OF PRESENT ILLNESS:  Robert Berger is a very nice 71 year old white gentleman.  He initially presented back in August with worsening back pain.  At that time, he was found to have compression fractures. He ultimately underwent biopsies of these.  He underwent kyphoplasty. He was subsequently found to have plasma cell infiltration.  He did undergo a bone marrow biopsy.  The bone marrow report ([id number]) showed hypercellular marrow with a plasma cell infiltration.  11% of all cells appeared to be consistent with plasma cells.  They showed typical features.  He did have cytogenetics sent.  Cytogenetics were basically all normal.  He did get radiation therapy to the I think thoracic region.  He was getting physical therapy at home.  He also is getting Zometa to help with his bone lesions.  We started treatment with Velcade.  This was just a weekly Velcade.  The Velcade was started I think back in September.  He had 2 cycles.  We then held the Velcade.  I think his last treatment was back on September 12.  He has had a nice response with Velcade.  He also was on some Decadron. His monoclonal studies have improved markedly.  His monoclonal spike went from 1.95 g/dL in August down to 1.47 g/dL in late May and early October.  His IgG level went from 3000 mg/dL down to 829 mg/dL.  He unfortunately was weakened by his treatments.  He was given some physical therapy at home.  He then presented with shortness of  breath.  He was taken to the emergency room.  He had a CT angiogram done which unfortunately showed bilateral pulmonary emboli.  More distressing, however, was the fact that the CT scan showed free air in the abdomen.  He was seen by  Dr. Gerrit Friends.  Dr. Gerrit Friends took him to emergent surgery on the 6th.  He was found to have a perforated sigmoid diverticulum.  This was totally unexpected.  He has a colostomy now.  He did have a IVC filter placed prior to surgery.  He is on anticoagulation.  We were asked to see him to help with any myeloma issues.  Unfortunately he is intubated.  Again, he is responsive, has been very, very impressive with 2 cycles of chemotherapy.  He has just been inactive at home.  I suspect that this might be the etiology for his DVT/PE.  He was on low-dose Decadron at home.  We were trying to taper him down. He was not on some Bactrim to try to help with infection prophylaxis. He is also on Diflucan.  I totally agree with the heparin for now.  I think he is probably going to need outpatient Lovenox or  Arixtra injections for his thromboembolic events.  We clearly do not have to treat him for the myeloma at this point.  I still believe that he will need his bone medications.  This I think would be reasonable to continue pending his improvement.  His last Zometa was back on October 2.  I just feel bad for Robert Berger.  He is a real nice guy.  He just has had a tough recovery from his myeloma diagnosis.  He really had lot of back issues because of myelomatous involvement.  We will try to focus on his quality life and try to improve his quality of life.  Obviously, having this emergent surgery with the colostomy, it is going to put him behind quite a bit.  Again, I do not see a role for chemotherapy right now.  His myeloma just has not been "active" and has responded very well to treatment.  We will definitely continue to follow him along while he is in  the hospital.  He is a real nice guy.  He is a very interesting man who has had some unfortunate luck with this perforated diverticulum.     [name], M.D.     PRE/MEDQ  D:  05/15/2012  T:  05/16/2012  Job:  [id number]

## 2012-05-17 ENCOUNTER — Ambulatory Visit: Payer: Medicare Other

## 2012-05-17 ENCOUNTER — Other Ambulatory Visit: Payer: Medicare Other | Admitting: Lab

## 2012-05-17 ENCOUNTER — Inpatient Hospital Stay (HOSPITAL_COMMUNITY): Payer: BC Managed Care – PPO

## 2012-05-17 DIAGNOSIS — E119 Type 2 diabetes mellitus without complications: Secondary | ICD-10-CM

## 2012-05-17 LAB — HEPARIN LEVEL (UNFRACTIONATED): Heparin Unfractionated: 0.75 IU/mL — ABNORMAL HIGH (ref 0.30–0.70)

## 2012-05-17 LAB — CBC
HCT: 26.8 % — ABNORMAL LOW (ref 39.0–52.0)
Hemoglobin: 9 g/dL — ABNORMAL LOW (ref 13.0–17.0)
MCH: 31.9 pg (ref 26.0–34.0)
MCHC: 33.6 g/dL (ref 30.0–36.0)
MCV: 95 fL (ref 78.0–100.0)
Platelets: 131 K/uL — ABNORMAL LOW (ref 150–400)
RBC: 2.82 MIL/uL — ABNORMAL LOW (ref 4.22–5.81)
RDW: 17.6 % — ABNORMAL HIGH (ref 11.5–15.5)
WBC: 3.1 K/uL — ABNORMAL LOW (ref 4.0–10.5)

## 2012-05-17 LAB — GLUCOSE, CAPILLARY
Glucose-Capillary: 104 mg/dL — ABNORMAL HIGH (ref 70–99)
Glucose-Capillary: 110 mg/dL — ABNORMAL HIGH (ref 70–99)
Glucose-Capillary: 113 mg/dL — ABNORMAL HIGH (ref 70–99)
Glucose-Capillary: 90 mg/dL (ref 70–99)
Glucose-Capillary: 91 mg/dL (ref 70–99)
Glucose-Capillary: 99 mg/dL (ref 70–99)

## 2012-05-17 LAB — BASIC METABOLIC PANEL WITH GFR
BUN: 12 mg/dL (ref 6–23)
CO2: 25 meq/L (ref 19–32)
Calcium: 6.5 mg/dL — ABNORMAL LOW (ref 8.4–10.5)
Chloride: 105 meq/L (ref 96–112)
Creatinine, Ser: 0.35 mg/dL — ABNORMAL LOW (ref 0.50–1.35)
GFR calc Af Amer: >90 mL/min
GFR calc non Af Amer: >90 mL/min
Glucose, Bld: 118 mg/dL — ABNORMAL HIGH (ref 70–99)
Potassium: 3.3 meq/L — ABNORMAL LOW (ref 3.5–5.1)
Sodium: 140 meq/L (ref 135–145)

## 2012-05-17 MED ORDER — HEPARIN (PORCINE) IN NACL 100-0.45 UNIT/ML-% IJ SOLN
1300.0000 [IU]/h | INTRAMUSCULAR | Status: DC
  Administered 2012-05-17: 1300 [IU]/h via INTRAVENOUS
  Filled 2012-05-17: qty 250

## 2012-05-17 MED ORDER — POTASSIUM CHLORIDE 10 MEQ/50ML IV SOLN
10.0000 meq | INTRAVENOUS | Status: AC
  Administered 2012-05-17 (×5): 10 meq via INTRAVENOUS
  Filled 2012-05-17 (×5): qty 50

## 2012-05-17 MED ORDER — FENTANYL CITRATE 0.05 MG/ML IJ SOLN
50.0000 ug | INTRAMUSCULAR | Status: DC | PRN
  Administered 2012-05-17: 50 ug via INTRAVENOUS
  Administered 2012-05-17: 100 ug via INTRAVENOUS
  Administered 2012-05-17 – 2012-05-18 (×2): 50 ug via INTRAVENOUS
  Administered 2012-05-18: 100 ug via INTRAVENOUS
  Administered 2012-05-18: 50 ug via INTRAVENOUS
  Administered 2012-05-19 – 2012-05-21 (×10): 100 ug via INTRAVENOUS
  Filled 2012-05-17 (×5): qty 2
  Filled 2012-05-17: qty 4
  Filled 2012-05-17 (×11): qty 2

## 2012-05-17 MED ORDER — HEPARIN (PORCINE) IN NACL 100-0.45 UNIT/ML-% IJ SOLN
1200.0000 [IU]/h | INTRAMUSCULAR | Status: DC
  Administered 2012-05-18 – 2012-05-19 (×2): 1200 [IU]/h via INTRAVENOUS
  Filled 2012-05-17 (×5): qty 250

## 2012-05-17 MED ORDER — FUROSEMIDE 10 MG/ML IJ SOLN
40.0000 mg | Freq: Once | INTRAMUSCULAR | Status: AC
  Administered 2012-05-17: 40 mg via INTRAVENOUS
  Filled 2012-05-17: qty 4

## 2012-05-17 NOTE — Progress Notes (Signed)
Elink MD, Dr. Deterdine, notified of bright red drainage coming from NG tube.  Instructed to continue heparin drip until morning labs have resulted.

## 2012-05-17 NOTE — Progress Notes (Signed)
Mr. Sanker is still intubated. He still on sedation.  He is on heparin for the thromboembolic events. There's not been any obvious blood through the an NG tube. Hopefully he will be extubated today.  He still continues on IV antibiotics.  His blood counts have dropped a little bit. I think this is reactionary to the stress of surgery and his antibiotics.  His last myeloma studies shows an incredible response with his monoclonal spike now down to 0.18 g/dL. We certainly can hold on any further therapy for him. Hopefully we will continue to see a response.   He's been afebrile. He does have a positive blood cultures with gram-positive rods and gram-positive cocci in clusters. Final ID on these organisms are pending. He is growing staph aureus that is sensitive to all antibiotics.   Hopefully, he will be extubated and begin more activity.  I would be very hesitant to put him on Coumadin. We could try Xarelto which is oral and daily. He has good renal function. I would consider this once he has achieved a much higher level of stability. The other option would be subcutaneous injections with Lovenox or Arixtra.  We will continue to follow him.   Pete E.

## 2012-05-17 NOTE — Progress Notes (Signed)
General Surgery Note  LOS: 4 days  POD# : 3 Days Post-Op Room - 1227  Assessment/Plan: 1.   EXPLORATORY LAPAROTOMY, LAPAROSCOPIC SIGMOID COLECTOMY,  COLOSTOMY - T. 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  On Merrem.  Expect extubation today.  Ostomy looks okay.  NGT not in the right place.  Will advance NGT, but if not much out, will pull NGT when patient extubated.  Wound okay.  2.  PE (pulmonary thromboembolism)   Doppler 05/13/2012 - bilateral DVT.  IVC filter placed by IR.  On IV heparin - level 0.75 - 05/17/2012  3.  Acute respiratory failure   On vent.  Possible extubate today.  Discussed with Dr. Craige Cotta. 4.  Thrombocytopenia  Plts - 131,000 - 05/17/2012 5.  HTN (hypertension)  6.  Multiple myeloma  Seen by Dr. Ross Marcus.  WBC - 3,100 - 05/17/2012 7. On steroids for multiple myeloma 8.  Foley has remained in because pt is intubated.  Will d/c tomorrow if all goes well with extubation. 9.  Hypokalemia - K+ - 3.3 - 05/17/2012  K+ ordered  Subjective:  Intubated, but opens eyes and responds. Objective:   Filed Vitals:   05/17/12 0500  BP: 103/66  Pulse:   Temp:   Resp: 14     Intake/Output from previous day:  10/08 0701 - 10/09 0700 In: 2271 [I.V.:1811; NG/GT:30; IV Piggyback:430] Out: 2830 [Urine:2690; Emesis/NG output:140]  Intake/Output this shift:  Total I/O In: 10 [I.V.:10] Out: -    Physical Exam:   General: Bearded older WM who is intubated.    HEENT: Normal. Pupils equal. .   Lungs: Clear.   Abdomen: Soft.  Rare BS.  Ostomy okay, but no output.   Wound: Removed 1/2 staples yesterday.  Looks okay.     Lab Results:     Basename 05/17/12 0526 05/16/12 0407  WBC 3.1* 3.8*  HGB 9.0* 9.1*  HCT 26.8* 27.1*  PLT 131* 121*    BMET    Basename 05/17/12 0526 05/16/12 0407  NA 140 137  K 3.3* 3.5  CL 105 104  CO2 25 24  GLUCOSE 118* 138*  BUN 12 9  CREATININE 0.35* 0.32*  CALCIUM 6.5* 6.6*    PT/INR  No results found for this  basename: LABPROT:2,INR:2 in the last 72 hours  ABG    Basename 05/15/12 0355 05/14/12 1132  PHART 7.437 7.369  HCO3 24.0 20.8     Studies/Results:  Dg Chest Port 1 View  05/17/2012  *RADIOLOGY REPORT*  Clinical Data: 71 year old male with respiratory failure.  PORTABLE CHEST - 1 VIEW  Comparison: 05/16/2012 and earlier.  Findings: Portable semi upright AP view 0444 hours.  Less rotated, but continued kyphotic view similar yesterday.  Endotracheal tube tip appears unchanged.  Stable enteric tube, tip not included. Stable right subclavian central line.  Unchanged very low lung volumes and increased opacity at the bases.  No pneumothorax, pulmonary edema, or large effusion.  Stable cardiac size and mediastinal contours.  Sequelae of thoracic vertebroplasty.  IMPRESSION: 1.  Kyphotic view.  Lines and tubes appear stable. 2.  Continued very low lung volumes.   Original Report Authenticated By: [name], M.D.    Dg Chest Port 1 View  05/16/2012  *RADIOLOGY REPORT*  Clinical Data: 71 year old male with respiratory failure.  Altered mental status.  PORTABLE CHEST - 1 VIEW  Comparison: 05/15/2012 and earlier.  Findings: Portable semi upright AP view 0455 hours.  The patient is more rotated to the  right.  Endotracheal tube tip projects higher above the level of clavicles, but this view is also more kyphotic. Enteric tube courses to the abdomen.  Grossly stable right subclavian central line.  Continued very low lung volumes.  Stable retrocardiac and perihilar opacity.  No pneumothorax or large effusion.  Sequelae of spinal vertebroplasty.  IMPRESSION: 1.  Rotated and more kyphotic view.  This might explain the apparently higher position of the endotracheal tube. Clinical correlation recommended. 2. Otherwise, stable lines and tubes. 3. Continued very low lung volumes.   Original Report Authenticated By: [name], M.D.      Anti-infectives:   Anti-infectives     Start     Dose/Rate Route  Frequency Ordered Stop   05/14/12 1200   vancomycin (VANCOCIN) IVPB 1000 mg/200 mL premix  Status:  Discontinued        1,000 mg 200 mL/hr over 60 Minutes Intravenous Every 8 hours 05/14/12 1115 05/15/12 1038   05/13/12 2230   meropenem (MERREM) 1 g in sodium chloride 0.9 % 100 mL IVPB        1 g 200 mL/hr over 30 Minutes Intravenous 3 times per day 05/13/12 2158     05/13/12 2100   levofloxacin (LEVAQUIN) IVPB 750 mg  Status:  Discontinued        750 mg 100 mL/hr over 90 Minutes Intravenous Every 24 hours 05/13/12 2050 05/13/12 2155   05/13/12 2100   metroNIDAZOLE (FLAGYL) IVPB 500 mg  Status:  Discontinued        500 mg 100 mL/hr over 60 Minutes Intravenous Every 8 hours 05/13/12 2050 05/13/12 2155          Ovidio Kin, MD, FACS Pager: [phone],   [facility] Office: [phone] 05/17/2012

## 2012-05-17 NOTE — Procedures (Signed)
Extubation Procedure Note  Patient Details:   Name: Robert Berger DOB: 1940/10/11 MRN: [id number]   Airway Documentation:  Airway 8 mm (Active)  Secured at (cm) 22 cm 05/17/2012  7:58 AM  Measured From Lips 05/17/2012  7:58 AM  Secured Location Right 05/17/2012  7:58 AM  Secured By [employer] 05/17/2012  7:58 AM  Tube Holder Repositioned Yes 05/17/2012  7:58 AM  Cuff Pressure (cm H2O) 22 cm H2O 05/17/2012  7:58 AM  Site Condition Dry 05/16/2012  3:00 PM    Evaluation  O2 sats: stable throughout Complications: No apparent complications Patient did tolerate procedure well. Bilateral Breath Sounds: Diminished Suctioning: Airway Yes  Suzan Garibaldi 05/17/2012, 9:12 AM

## 2012-05-17 NOTE — Progress Notes (Signed)
Heard from staff that patient is struggling with meaning. Went to offer support. Patient sleeping. Will follow up to offer support.  05/17/12 1600  Clinical Encounter Type  Visited With Patient;Health care provider  Visit Type Initial  Consult/Referral To Nurse  Recommendations Follow up

## 2012-05-17 NOTE — Consult Note (Signed)
WOC ostomy consult: Initial Visit Stoma type/location: LLQ Colostomy Stomal assessment/size: 1 and 3/4 inches round, os in center.  Stoma pale pink, moist, generally budded but flush at 9 o'clock. Not functioning at this time. Peristomal assessment: 95% clear, intact, fluid-filled blister noted at 9 o'clock measuring 2cm x 1cm. Treatment options for stomal/peristomal skin: Skin barrier used to enhance seal.  Tape edge of barrier trimmed away to avoid contact with fulid-filled blister. Output scant amount of bloody drainage in old pouch. Ostomy pouching: 2pc pouching system selected, 2 and 1/4 inch with skin barrier ring.  [place] #s provided in order.  Education provided:patient aware of WOC Nurse role in post operative course.  States that he has been bed-ridden for the past few months and that his wife assists him in all aspects of his ADLs at this time, so it will be necessary to include her in our teaching sessions.  Patient states that his wife has medical problems of her own but that she "has stood by him" and he believes "she can manage this if it is temporary".  Asking how long he would have to have a pouch and I have referred him to his surgeons for the answer to that question. Educational booklet provided.  Patient established with [employer] post op sampling program per his request. The WOC Team will follow with you. Thanks, Ladona Mow, MSN, RN, [facility], CWOCN [phone])

## 2012-05-17 NOTE — Progress Notes (Signed)
ANTICOAGULATION CONSULT NOTE - Follow Up Consult  Pharmacy Consult for Heparin Indication: pulmonary embolus and DVT  Allergies  Allergen Reactions  . Penicillins Swelling    Patient Measurements: Height: 5\' 10"  (177.8 cm) Weight: 222 lb 7.1 oz (100.9 kg) IBW/kg (Calculated) : 73  Heparin Dosing Weight:   Vital Signs: Temp: 98.7 F (37.1 C) (10/09 0400) Temp src: Oral (10/09 0400) BP: 110/68 mmHg (10/09 0800) Pulse Rate: 83  (10/09 0800)  Labs:  Basename 05/17/12 0526 05/16/12 1904 05/16/12 0407 05/15/12 0435 05/14/12 1131  HGB 9.0* -- 9.1* -- --  HCT 26.8* -- 27.1* 28.0* --  PLT 131* -- 121* 123* --  APTT -- -- -- -- 28  LABPROT -- -- -- -- --  INR -- -- -- -- --  HEPARINUNFRC 0.75* 0.57 <0.10* -- --  CREATININE 0.35* -- 0.32* <0.20* --  CKTOTAL -- -- -- -- --  CKMB -- -- -- -- --  TROPONINI -- -- -- -- --    Estimated Creatinine Clearance: 100.9 ml/min (by C-G formula based on Cr of 0.35).   Medications:  Scheduled:    . antiseptic oral rinse  15 mL Mouth Rinse QID  . chlorhexidine  15 mL Mouth/Throat BID  . diltiazem  20 mg Intravenous Once  . furosemide  40 mg Intravenous Once  . furosemide  40 mg Intravenous Once  . hydrocortisone sod succinate (SOLU-CORTEF) injection  50 mg Intravenous Q8H  . insulin aspart  0-9 Units Subcutaneous Q4H  . meropenem (MERREM) IV  1 g Intravenous Q8H  . pantoprazole (PROTONIX) IV  40 mg Intravenous Q12H  . potassium chloride  10 mEq Intravenous Q1 Hr x 4  . potassium chloride  10 mEq Intravenous Q1 Hr x 5   Infusions:    . sodium chloride 50 mL/hr at 05/16/12 1042  . diltiazem (CARDIZEM) infusion 10 mg/hr (05/17/12 0528)  . heparin 1,400 Units/hr (05/16/12 2023)  . DISCONTD: fentaNYL infusion INTRAVENOUS 100 mcg/hr (05/17/12 0736)  . DISCONTD: heparin Stopped (05/16/12 0047)  . DISCONTD: midazolam (VERSED) infusion 2 mg/hr (05/16/12 1437)    Assessment:  71 yom with new bilateral PEs and bilateral DVTs s/p IVC  filter placement and ex-lap, sigmoid colectomy, descending colostomy 10/6.   Pt was started on IV heparin post-op, but heparin was held d/t bloody drainage from NGT, now resolved, and heparin resumed 10/8.  Heparin level this morning slightly above goal range.  RN notes no further bleeding or complications.  NGT was removed.  Stable thrombocytopenia at 131, Hgb 9.0.  Goal of Therapy:  Heparin level 0.3-0.7 units/ml Monitor platelets by anticoagulation protocol: Yes   Plan:   Reduce heparin IV infusion to 1300 units/hr  Heparin level 8 hours after change  Daily heparin level and CBC  Continue to monitor H&H and platelets  Lynann Beaver PharmD, BCPS Pager [phone] 05/17/2012 9:23 AM

## 2012-05-17 NOTE — Progress Notes (Signed)
Name: Robert Berger MRN: [id number] DOB: November 13, 1940    LOS: 4  Referring Provider:  Dr. Rosalia Hammers Reason for Referral:  PE, bowel perforation  PULMONARY / CRITICAL CARE MEDICINE  HPI:  71 yo male admitted 05/13/2012 dyspnea from b/l PE and perforated sigmoid colon.  PCCM asked to admit.  Remained on vent post-op. PMHx MM, chronic pain, HTN  Events: 10/07 Bloody NG tube outpt 10/08 SVT  Subjective: Doing well with SBT.  Decreased bloody NG drainage.  Vital Signs: Temp:  [98.7 F (37.1 C)-99.3 F (37.4 C)] 98.7 F (37.1 C) (10/09 0400) Pulse Rate:  [83-114] 83  (10/09 0800) Resp:  [14-26] 14  (10/09 0800) BP: (96-134)/(60-87) 110/68 mmHg (10/09 0800) SpO2:  [96 %-100 %] 98 % (10/09 0800) FiO2 (%):  [30 %] 30 % (10/09 0800) Weight:  [222 lb 7.1 oz (100.9 kg)] 222 lb 7.1 oz (100.9 kg) (10/09 0100)  Physical Examination: General - no distress HEENT - ETT, NG tube in place Cardiac - s1s2, tachycardic Chest - scattered rhonchi Abd - soft, mild tenderness Ext - 1+ edema Neuro - sedated  Dg Chest Port 1 View  05/17/2012  *RADIOLOGY REPORT*  Clinical Data: 71 year old male with respiratory failure.  PORTABLE CHEST - 1 VIEW  Comparison: 05/16/2012 and earlier.  Findings: Portable semi upright AP view 0444 hours.  Less rotated, but continued kyphotic view similar yesterday.  Endotracheal tube tip appears unchanged.  Stable enteric tube, tip not included. Stable right subclavian central line.  Unchanged very low lung volumes and increased opacity at the bases.  No pneumothorax, pulmonary edema, or large effusion.  Stable cardiac size and mediastinal contours.  Sequelae of thoracic vertebroplasty.  IMPRESSION: 1.  Kyphotic view.  Lines and tubes appear stable. 2.  Continued very low lung volumes.   Original Report Authenticated By: [name], M.D.    Dg Chest Port 1 View  05/16/2012  *RADIOLOGY REPORT*  Clinical Data: 71 year old male with respiratory failure.  Altered mental status.   PORTABLE CHEST - 1 VIEW  Comparison: 05/15/2012 and earlier.  Findings: Portable semi upright AP view 0455 hours.  The patient is more rotated to the right.  Endotracheal tube tip projects higher above the level of clavicles, but this view is also more kyphotic. Enteric tube courses to the abdomen.  Grossly stable right subclavian central line.  Continued very low lung volumes.  Stable retrocardiac and perihilar opacity.  No pneumothorax or large effusion.  Sequelae of spinal vertebroplasty.  IMPRESSION: 1.  Rotated and more kyphotic view.  This might explain the apparently higher position of the endotracheal tube. Clinical correlation recommended. 2. Otherwise, stable lines and tubes. 3. Continued very low lung volumes.   Original Report Authenticated By: [name], M.D.     ASSESSMENT AND PLAN  PULMONARY  Lab 05/15/12 0355 05/14/12 1132  PHART 7.437 7.369  PCO2ART 36.3 36.9  PO2ART 74.9* 195.0*  HCO3 24.0 20.8  O2SAT 97.2 99.3   ETT:  10/06>>  CT chest 10/05>>Multiple b/l PE, changes of MM in thoracic spine and manubrium, basilar ATX Doppler legs 10/06>>Acute DVT Rt posterior tibial and peroneal veins, and Lt common femoral, saphenofemoral junction, and posterior tibial veins.  A:  Acute respiratory failure 2nd to PE and perforated sigmoid colon.  S/p IVC by IR 10/06. P:   Proceed with extubation 10/09 F/u CXR Titrate oxygen to keep SpO2 > 92% Continue heparin gtt>>will eventually need to transition to coumadin, and have IV filter removed when more stable Will  need to f/u doppler legs week of 10/14  CARDIOVASCULAR  Lab 05/13/12 1821  TROPONINI <0.30  LATICACIDVEN --  PROBNP --   Lines:  Rt [place] CVL 10/05>>  Echo 10/07>>mod LV systolic dysfx, mild LVH, nl RV systolic fx  CVP 5  A: Hx of HTN. SVT>>new 10/08>>improved 10/09. Volume overload. P:  Diltiazem protocol>>goal HR < 105 Keep in even to negative fluid balance>>give 40 mg lasix IV x one 10/09   RENAL  Lab  05/17/12 0526 05/16/12 0407 05/15/12 0435 05/14/12 0530 05/13/12 1821  NA 140 137 134* 135 132*  K 3.3* 3.5 -- -- --  CL 105 104 102 101 97  CO2 25 24 25 24 25   BUN 12 9 9 16 16   CREATININE 0.35* 0.32* <0.20* 0.38* 0.74  CALCIUM 6.5* 6.6* 7.1* 8.0* 9.6  MG 2.3 2.1 2.2 2.4 --  PHOS -- -- 2.1* 3.4 --   Intake/Output      10/08 0701 - 10/09 0700 10/09 0701 - 10/10 0700   I.V. (mL/kg) 1847 (18.3) 83 (0.8)   NG/GT 30    IV Piggyback 430 50   Total Intake(mL/kg) 2307 (22.9) 133 (1.3)   Urine (mL/kg/hr) 2690 (1.1) 100   Emesis/NG output 140    Stool 0    Total Output 2830 100   Net -523 +33          Intake/Output Summary (Last 24 hours) at 05/17/12 0846 Last data filed at 05/17/12 0800  Gross per 24 hour  Intake   2318 ml  Output   2865 ml  Net   -547 ml   Foley:  none  A: Hypokalemia. P:   Continue IV fluid rate to 50 ml/hr F/u renal fx, urine outpt, electrolytes and replace as needed  GASTROINTESTINAL  Lab 05/13/12 1821 05/10/12 1112  AST 11 43*  ALT 6 --  ALKPHOS 99 113*  BILITOT 0.4 1.10  PROT 6.8 5.9*  ALBUMIN 3.3* --   CT abd/pelvis 10/05>>free air, partial SBO, b/l inguinal hernias, changes of MM Lt iliac bone  A:  Perforated sigmoid colon likely 2nd to severe constipation from opiate use. Bloody NG tube outpt>>appears to have decreased. P:   Post-op care and nutrition per CCS Continue protonix BID until sure no further bloody NG outpt If bleeding recurs, then may need GI to do EGD  HEMATOLOGIC  Lab 05/17/12 0526 05/16/12 0407 05/15/12 0435 05/14/12 1131 05/14/12 0530 05/13/12 1821 05/13/12 1740  HGB 9.0* 9.1* 9.4* -- 10.3* -- 11.5*  HCT 26.8* 27.1* 28.0* -- 30.2* -- 33.8*  PLT 131* 121* 123* -- 111* -- 128*  INR -- -- -- -- -- 1.23 --  APTT -- -- -- 28 -- -- --   A:  Anemia and thrombocytopenia in setting of multiple myeloma and critical illness.  Followed by Dr. Myna Hidalgo as outpt.  On zometa and velcade with decadron as outpt. P:  F/u  CBC Transfuse for Hb < 7  INFECTIOUS  Lab 05/17/12 0526 05/16/12 0407 05/15/12 0435 05/14/12 0530 05/13/12 1740  WBC 3.1* 3.8* 4.6 3.4* 5.8  PROCALCITON -- -- -- -- --   Cultures: Blood 10/05>>  Antibiotics: Levaquin 10/05>>10/05 Meropenem 10/05>> Flagyl 10/05>>10/05 Vancomycin 10/05>>10/07  A: Peritonitis from sigmoid colon perforation. 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 Hyperglycemia. P:  Continue solucortef>>changed to 50 mg q8h on 10/08 SSI  NEUROLOGIC  A: Chronic pain. P:   PRN fentanyl after extubation.  Critical care time 40 minutes  Coralyn Helling, MD [facility] Pulmonary/Critical Care 05/17/2012, 8:46 AM Pager:  [phone] After 3pm call: [phone]

## 2012-05-18 DIAGNOSIS — M549 Dorsalgia, unspecified: Secondary | ICD-10-CM

## 2012-05-18 LAB — BASIC METABOLIC PANEL
CO2: 26 mEq/L (ref 19–32)
Chloride: 102 mEq/L (ref 96–112)
GFR calc Af Amer: >90 mL/min (ref >90)
Potassium: 3.3 mEq/L — ABNORMAL LOW (ref 3.5–5.1)

## 2012-05-18 LAB — HEPARIN LEVEL (UNFRACTIONATED)
Heparin Unfractionated: 0.56 IU/mL (ref 0.30–0.70)
Heparin Unfractionated: 0.57 IU/mL (ref 0.30–0.70)

## 2012-05-18 LAB — CBC
HCT: 28.9 % — ABNORMAL LOW (ref 39.0–52.0)
MCV: 93.5 fL (ref 78.0–100.0)
Platelets: 137 10*3/uL — ABNORMAL LOW (ref 150–400)
RBC: 3.09 MIL/uL — ABNORMAL LOW (ref 4.22–5.81)
RDW: 17.6 % — ABNORMAL HIGH (ref 11.5–15.5)
WBC: 3.1 10*3/uL — ABNORMAL LOW (ref 4.0–10.5)

## 2012-05-18 LAB — GLUCOSE, CAPILLARY
Glucose-Capillary: 85 mg/dL (ref 70–99)
Glucose-Capillary: 113 mg/dL — ABNORMAL HIGH (ref 70–99)
Glucose-Capillary: 173 mg/dL — ABNORMAL HIGH (ref 70–99)

## 2012-05-18 MED ORDER — ZOLPIDEM TARTRATE 5 MG PO TABS
5.0000 mg | ORAL_TABLET | Freq: Once | ORAL | Status: AC
  Administered 2012-05-19: 5 mg via ORAL
  Filled 2012-05-18: qty 1

## 2012-05-18 MED ORDER — DEXAMETHASONE 4 MG PO TABS
4.0000 mg | ORAL_TABLET | Freq: Every day | ORAL | Status: DC
  Administered 2012-05-18 – 2012-05-19 (×2): 4 mg via ORAL
  Filled 2012-05-18 (×3): qty 1

## 2012-05-18 MED ORDER — POTASSIUM CHLORIDE 20 MEQ/15ML (10%) PO LIQD
40.0000 meq | Freq: Every day | ORAL | Status: DC
  Administered 2012-05-19 – 2012-05-24 (×6): 40 meq via ORAL
  Filled 2012-05-18 (×6): qty 30

## 2012-05-18 MED ORDER — SODIUM CHLORIDE 0.9 % IJ SOLN
10.0000 mL | INTRAMUSCULAR | Status: DC | PRN
  Administered 2012-05-20: 10 mL
  Administered 2012-05-23: 20 mL
  Administered 2012-05-23: 10 mL

## 2012-05-18 MED ORDER — DILTIAZEM HCL 30 MG PO TABS
30.0000 mg | ORAL_TABLET | Freq: Four times a day (QID) | ORAL | Status: DC
  Administered 2012-05-18 – 2012-05-24 (×26): 30 mg via ORAL
  Filled 2012-05-18 (×27): qty 1

## 2012-05-18 MED ORDER — BOOST / RESOURCE BREEZE PO LIQD
1.0000 | Freq: Three times a day (TID) | ORAL | Status: DC
  Administered 2012-05-18 – 2012-05-19 (×4): 1 via ORAL

## 2012-05-18 MED ORDER — ONDANSETRON HCL 4 MG/2ML IJ SOLN
4.0000 mg | Freq: Four times a day (QID) | INTRAMUSCULAR | Status: DC | PRN
  Administered 2012-05-19: 4 mg via INTRAVENOUS
  Filled 2012-05-18: qty 2

## 2012-05-18 MED ORDER — INSULIN ASPART 100 UNIT/ML ~~LOC~~ SOLN
0.0000 [IU] | Freq: Every day | SUBCUTANEOUS | Status: DC
  Administered 2012-05-18: 2 [IU] via SUBCUTANEOUS
  Administered 2012-05-19: 3 [IU] via SUBCUTANEOUS

## 2012-05-18 MED ORDER — INSULIN ASPART 100 UNIT/ML ~~LOC~~ SOLN
0.0000 [IU] | Freq: Three times a day (TID) | SUBCUTANEOUS | Status: DC
  Administered 2012-05-18: 3 [IU] via SUBCUTANEOUS
  Administered 2012-05-19: 8 [IU] via SUBCUTANEOUS
  Administered 2012-05-19: 5 [IU] via SUBCUTANEOUS
  Administered 2012-05-19: 2 [IU] via SUBCUTANEOUS
  Administered 2012-05-20: 3 [IU] via SUBCUTANEOUS
  Administered 2012-05-20 (×2): 2 [IU] via SUBCUTANEOUS
  Administered 2012-05-21 – 2012-05-22 (×2): 3 [IU] via SUBCUTANEOUS
  Administered 2012-05-22 – 2012-05-23 (×3): 2 [IU] via SUBCUTANEOUS
  Administered 2012-05-23: 5 [IU] via SUBCUTANEOUS
  Administered 2012-05-23: 2 [IU] via SUBCUTANEOUS
  Administered 2012-05-24: 3 [IU] via SUBCUTANEOUS

## 2012-05-18 MED ORDER — POTASSIUM PHOSPHATE DIBASIC 3 MMOLE/ML IV SOLN
30.0000 mmol | Freq: Once | INTRAVENOUS | Status: AC
  Administered 2012-05-18: 30 mmol via INTRAVENOUS
  Filled 2012-05-18: qty 10

## 2012-05-18 MED ORDER — FUROSEMIDE 10 MG/ML IJ SOLN
40.0000 mg | Freq: Every day | INTRAMUSCULAR | Status: DC
  Administered 2012-05-18 – 2012-05-21 (×4): 40 mg via INTRAVENOUS
  Filled 2012-05-18 (×5): qty 4

## 2012-05-18 MED ORDER — POTASSIUM CHLORIDE 20 MEQ/15ML (10%) PO LIQD: 40.0000 meq | Freq: Every day | ORAL | Status: DC

## 2012-05-18 NOTE — Progress Notes (Signed)
Brief Nutrition Note  Discussed in rounds today. Pt extubated with resolving ileus.  Diet advanced to clear liquids.  Added [place] tid.  Will monitor diet advancement.  Oran Rein, RD, LDN Clinical Inpatient Dietitian Pager:  [phone] Weekend and after hours pager:  [phone]

## 2012-05-18 NOTE — Evaluation (Signed)
Occupational Therapy Evaluation Patient Details Name: Robert Berger MRN: [id number] DOB: 24-May-1941 Today's Date: 05/18/2012 Time: 7829-5621 OT Time Calculation (min): 10 min  OT Assessment / Plan / Recommendation Clinical Impression  Pt is a 71 yo male with muliple medical problemes including multiple myeloma, bedridden at home. Cared for by wife and home health. PT to continue to see pt for basic bed mobility and family ed to decrease burden of care. Pt 's wife has been providing most of pts care at home. Pt currently presents with no OT needs at this time.    OT Assessment  Patient does not need any further OT services    Follow Up Recommendations  No OT follow up    Barriers to Discharge      Equipment Recommendations  Wheelchair (measurements);Wheelchair cushion (measurements) (? mechanical lift)    Recommendations for Other Services    Frequency       Precautions / Restrictions Precautions Precaution Comments: coclstomy, bed boubd PTA   Pertinent Vitals/Pain Pt reported 7/10 pain in abdomen. RN premedicated.    ADL  Grooming: Simulated;Set up Where Assessed - Grooming: Supine, head of bed up Toileting - Clothing Manipulation and Hygiene: Simulated;+2 Total assistance Where Assessed - Toileting Clothing Manipulation and Hygiene: Supine, head of bed flat;Rolling right and/or left ADL Comments: Wife stated pt is taken care of at home by her. At this point in time, pt may need mechanical lift for OOB. It is unlikely pt's wife would be able to mobilize pt without +2 A.    OT Diagnosis:    OT Problem List:   OT Treatment Interventions:     OT Goals    Visit Information  Last OT Received On: 05/18/12 Assistance Needed: +2 PT/OT Co-Evaluation/Treatment: Yes    Subjective Data  Subjective: I can't do anything. Patient Stated Goal: Return home with wife.   Prior Functioning     Home Living Lives With: Spouse Available Help at Discharge: Home health;Available  24 hours/day (had 1 x week ) Type of Home: House Home Access: Stairs to enter [employer] of Steps: Pt transported into and out of home via stretcher Home Layout: One level Bathroom Shower/Tub: Other (comment) (Pt given bed baths) Bathroom Toilet:  (Pt utlilzes depends) Home Adaptive Equipment: Bedside commode/3-in-1;Walker - rolling Additional Comments: pt. has adjustable head/foot bed but not hospital bed. Prior Function Level of Independence: Needs assistance Needs Assistance: Bathing;Dressing;Toileting;Meal Prep;Light Housekeeping Bath: Total Dressing: Total Toileting: Total Meal Prep: Total Light Housekeeping: Total Driving: No Vocation: Retired Comments: Per wife pt has been bedbound and total care except for feeding himself , wife reports that he could assist with rolling and scooting up in bed. Has not been OOB for several weeks except by EMS Communication Communication: No difficulties         Vision/Perception     Cognition  Overall Cognitive Status: Appears within functional limits for tasks assessed/performed Arousal/Alertness: Awake/alert Orientation Level: Appears intact for tasks assessed Behavior During Session: [facility] for tasks performed    Extremity/Trunk Assessment Right Upper Extremity Assessment RUE ROM/Strength/Tone: Unable to fully assess;Due to pain Left Upper Extremity Assessment LUE ROM/Strength/Tone: Unable to fully assess;Due to pain Right Lower Extremity Assessment RLE ROM/Strength/Tone: Unable to fully assess;Due to pain RLE ROM/Strength/Tone Deficits: pt is in too much pain, did assist to move leg  to remove pillow  RLE Sensation: WFL - Light Touch Left Lower Extremity Assessment LLE ROM/Strength/Tone: Unable to fully assess;Due to pain     Mobility Bed Mobility  Bed Mobility: Not assessed Details for Bed Mobility Assistance: due to pt pain level at present. Transfers Details for Transfer Assistance: Pt will most likely not be  able to get oob. Chair position may be most he will tolerate eventually.     Shoulder Instructions     Exercise     Balance     End of Session OT - End of Session Activity Tolerance: Patient limited by fatigue;Patient limited by pain Patient left: in bed;with call bell/phone within reach;with nursing in room;with family/visitor present  GO     Hue Steveson A OTR/L 05/18/2012, 9:20 AM

## 2012-05-18 NOTE — Progress Notes (Addendum)
ANTICOAGULATION CONSULT NOTE - Follow Up Consult  Pharmacy Consult for Heparin Indication: pulmonary embolus and DVT  Allergies  Allergen Reactions  . Penicillins Swelling    Patient Measurements: Height: 5\' 10"  (177.8 cm) Weight: 219 lb 12.8 oz (99.7 kg) IBW/kg (Calculated) : 73  Heparin Dosing Weight:   Vital Signs: Temp: 98.7 F (37.1 C) (10/10 0000) Temp src: Oral (10/10 0000) BP: 119/67 mmHg (10/10 0600) Pulse Rate: 90  (10/10 0600)  Labs:  Basename 05/18/12 0500 05/17/12 1742 05/17/12 0526 05/16/12 0407  HGB 9.8* -- 9.0* --  HCT 28.9* -- 26.8* 27.1*  PLT 137* -- 131* 121*  APTT -- -- -- --  LABPROT -- -- -- --  INR -- -- -- --  HEPARINUNFRC 0.56 0.72* 0.75* --  CREATININE 0.28* -- 0.35* 0.32*  CKTOTAL -- -- -- --  CKMB -- -- -- --  TROPONINI -- -- -- --    Estimated Creatinine Clearance: 100.3 ml/min (by C-G formula based on Cr of 0.28).   Medications:  Scheduled:     . antiseptic oral rinse  15 mL Mouth Rinse QID  . chlorhexidine  15 mL Mouth/Throat BID  . furosemide  40 mg Intravenous Once  . insulin aspart  0-9 Units Subcutaneous Q4H  . meropenem (MERREM) IV  1 g Intravenous Q8H  . pantoprazole (PROTONIX) IV  40 mg Intravenous Q12H  . potassium chloride  10 mEq Intravenous Q1 Hr x 5   Infusions:     . sodium chloride 50 mL/hr at 05/17/12 1427  . diltiazem (CARDIZEM) infusion 5 mg/hr (05/17/12 2220)  . heparin 1,200 Units/hr (05/17/12 1825)  . DISCONTD: fentaNYL infusion INTRAVENOUS 100 mcg/hr (05/17/12 0736)  . DISCONTD: heparin 1,400 Units/hr (05/16/12 2023)  . DISCONTD: heparin 1,300 Units/hr (05/17/12 1426)  . DISCONTD: midazolam (VERSED) infusion 2 mg/hr (05/16/12 1437)    Assessment:  71 yom with new bilateral PEs and bilateral DVTs s/p IVC filter placement and ex-lap, sigmoid colectomy, descending colostomy 10/6.   Pt was started on IV heparin post-op, but heparin was held d/t bloody drainage from NGT, now resolved, and heparin  resumed 10/8.  Heparin level within goal range.  No further bleeding or complications noted.  Hgb improved to 9.8, Plt improved to 137.  Follow.  Goal of Therapy:  Heparin level 0.3-0.7 units/ml Monitor platelets by anticoagulation protocol: Yes   Plan:   Continue heparin IV infusion at 1200 units/hr  Heparin level in 8 hours to confirm therapeutic level  Daily heparin level and CBC  Continue to monitor H&H and platelets  Lynann Beaver PharmD, BCPS Pager [phone] 05/18/2012 7:13 AM   Addendum: Heparin level 05/18/12 at 1300 remained therapeutic at 0.57 Continue Heparin drip at 1200 units/hr Daily heparin level, CBC  Lynann Beaver PharmD, BCPS Pager [phone] 05/18/2012 2:20 PM

## 2012-05-18 NOTE — Progress Notes (Signed)
Patient ID: Robert Berger, male   DOB: [birth year], 71 y.o.   MRN: [id number] 4 Days Post-Op  Subjective: Pt feels ok, some pain.  No nausea with NGT out.  Tolerating some ice chips.  Objective: Vital signs in last 24 hours: Temp:  [97.5 F (36.4 C)-98.8 F (37.1 C)] 97.5 F (36.4 C) (10/10 0800) Pulse Rate:  [89-101] 90  (10/10 0600) Resp:  [20-39] 20  (10/10 0400) BP: (103-129)/(49-73) 119/67 mmHg (10/10 0600) SpO2:  [96 %-100 %] 100 % (10/10 0600) FiO2 (%):  [2 %] 2 % (10/09 1400) Weight:  [219 lb 12.8 oz (99.7 kg)] 219 lb 12.8 oz (99.7 kg) (10/10 0548) Last BM Date: 05/12/12  Intake/Output from previous day: 10/09 0701 - 10/10 0700 In: 2093.4 [I.V.:1573.4; IV Piggyback:520] Out: 3590 [Urine:3590] Intake/Output this shift:    PE: Abd: soft, appropriately tender, few BS, ND, ostomy with some air, no stool yet.  Lab Results:   Basename 05/18/12 0500 05/17/12 0526  WBC 3.1* 3.1*  HGB 9.8* 9.0*  HCT 28.9* 26.8*  PLT 137* 131*   BMET  Basename 05/18/12 0500 05/17/12 0526  NA 136 140  K 3.3* 3.3*  CL 102 105  CO2 26 25  GLUCOSE 86 118*  BUN 6 12  CREATININE 0.28* 0.35*  CALCIUM 6.5* 6.5*   PT/INR No results found for this basename: LABPROT:2,INR:2 in the last 72 hours CMP     Component Value Date/Time   NA 136 05/18/2012 0500   NA 137 05/10/2012 1112   K 3.3* 05/18/2012 0500   K 5.1* 05/10/2012 1112   CL 102 05/18/2012 0500   CL 96* 05/10/2012 1112   CO2 26 05/18/2012 0500   CO2 29 05/10/2012 1112   GLUCOSE 86 05/18/2012 0500   GLUCOSE 193* 05/10/2012 1112   BUN 6 05/18/2012 0500   BUN 13 05/10/2012 1112   CREATININE 0.28* 05/18/2012 0500   CREATININE 0.5* 05/10/2012 1112   CALCIUM 6.5* 05/18/2012 0500   CALCIUM 8.5 05/10/2012 1112   PROT 6.8 05/13/2012 1821   PROT 5.9* 05/10/2012 1112   ALBUMIN 3.3* 05/13/2012 1821   AST 11 05/13/2012 1821   AST 43* 05/10/2012 1112   ALT 6 05/13/2012 1821   ALKPHOS 99 05/13/2012 1821   ALKPHOS 113* 05/10/2012 1112   BILITOT  0.4 05/13/2012 1821   BILITOT 1.10 05/10/2012 1112   GFRNONAA >90 05/18/2012 0500   GFRAA >90 05/18/2012 0500   Lipase     Component Value Date/Time   LIPASE 28 03/09/2012 1100       Studies/Results: Dg Chest Port 1 View  05/17/2012  *RADIOLOGY REPORT*  Clinical Data: 71 year old male with respiratory failure.  PORTABLE CHEST - 1 VIEW  Comparison: 05/16/2012 and earlier.  Findings: Portable semi upright AP view 0444 hours.  Less rotated, but continued kyphotic view similar yesterday.  Endotracheal tube tip appears unchanged.  Stable enteric tube, tip not included. Stable right subclavian central line.  Unchanged very low lung volumes and increased opacity at the bases.  No pneumothorax, pulmonary edema, or large effusion.  Stable cardiac size and mediastinal contours.  Sequelae of thoracic vertebroplasty.  IMPRESSION: 1.  Kyphotic view.  Lines and tubes appear stable. 2.  Continued very low lung volumes.   Original Report Authenticated By: [name], M.D.     Anti-infectives: Anti-infectives     Start     Dose/Rate Route Frequency Ordered Stop   05/14/12 1200   vancomycin (VANCOCIN) IVPB 1000 mg/200 mL premix  Status:  Discontinued        1,000 mg 200 mL/hr over 60 Minutes Intravenous Every 8 hours 05/14/12 1115 05/15/12 1038   05/13/12 2230   meropenem (MERREM) 1 g in sodium chloride 0.9 % 100 mL IVPB        1 g 200 mL/hr over 30 Minutes Intravenous 3 times per day 05/13/12 2158     05/13/12 2100   levofloxacin (LEVAQUIN) IVPB 750 mg  Status:  Discontinued        750 mg 100 mL/hr over 90 Minutes Intravenous Every 24 hours 05/13/12 2050 05/13/12 2155   05/13/12 2100   metroNIDAZOLE (FLAGYL) IVPB 500 mg  Status:  Discontinued        500 mg 100 mL/hr over 60 Minutes Intravenous Every 8 hours 05/13/12 2050 05/13/12 2155          Assessment/Plan  1. S/p Hartman's 2. Post op ileus, resolving 3. Multiple myeloma 4. VDRF, resolved  Plan: 1. Will try clear liquids today 2.  Patient is bedridden according to his wife.  PT working with him, moving his legs as much as they are able.   LOS: 5 days    OSBORNE,KELLY E 05/18/2012, 9:08 AM Pager: [phone]  Moved to 4 [address]. Tolerating clear liquids.  Just had a PICC placed in the left arm. Bedridden secondary to MM and treatment for the MM. Continues to get slowly better from a surgery standpoint.  Ovidio Kin, MD, [facility] Surgery Pager: [phone] Office phone:  [phone]

## 2012-05-18 NOTE — Progress Notes (Signed)
Peripherally Inserted Central Catheter/Midline Placement  The IV Nurse has discussed with the patient and/or persons authorized to consent for the patient, the purpose of this procedure and the potential benefits and risks involved with this procedure.  The benefits include less needle sticks, lab draws from the catheter and patient may be discharged home with the catheter.  Risks include, but not limited to, infection, bleeding, blood clot (thrombus formation), and puncture of an artery; nerve damage and irregular heat beat.  Alternatives to this procedure were also discussed.  PICC/Midline Placement Documentation        Robert Berger 05/18/2012, 4:07 PM

## 2012-05-18 NOTE — Progress Notes (Addendum)
Name: Petros Ahart MRN: [id number] DOB: Nov 12, 1940    LOS: 5  Referring Provider:  Dr. Rosalia Hammers Reason for Referral:  PE, bowel perforation  PULMONARY / CRITICAL CARE MEDICINE  HPI:  71 yo male admitted 05/13/2012 dyspnea from b/l PE and perforated sigmoid colon.  PCCM asked to admit.  Remained on vent post-op. PMHx MM, chronic pain, HTN  Events: 10/07 Bloody NG tube outpt 10/08 SVT  Subjective: Wants to eat   Vital Signs: Temp:  [97.5 F (36.4 C)-98.8 F (37.1 C)] 97.5 F (36.4 C) (10/10 0800) Pulse Rate:  [89-101] 90  (10/10 0600) Resp:  [20-39] 20  (10/10 0400) BP: (103-129)/(49-73) 119/67 mmHg (10/10 0600) SpO2:  [96 %-100 %] 100 % (10/10 0600) FiO2 (%):  [2 %] 2 % (10/09 1400) Weight:  [99.7 kg (219 lb 12.8 oz)] 99.7 kg (219 lb 12.8 oz) (10/10 0548) 2 liters  Physical Examination: General - no distress HEENT [place], No JVD Cardiac - s1s2, tachycardic Chest -decreased in bases.  Abd - soft, mild tenderness Ext - 1+ edema Neuro - sedated  Dg Chest Port 1 View  05/17/2012  *RADIOLOGY REPORT*  Clinical Data: 71 year old male with respiratory failure.  PORTABLE CHEST - 1 VIEW  Comparison: 05/16/2012 and earlier.  Findings: Portable semi upright AP view 0444 hours.  Less rotated, but continued kyphotic view similar yesterday.  Endotracheal tube tip appears unchanged.  Stable enteric tube, tip not included. Stable right subclavian central line.  Unchanged very low lung volumes and increased opacity at the bases.  No pneumothorax, pulmonary edema, or large effusion.  Stable cardiac size and mediastinal contours.  Sequelae of thoracic vertebroplasty.  IMPRESSION: 1.  Kyphotic view.  Lines and tubes appear stable. 2.  Continued very low lung volumes.   Original Report Authenticated By: [name], M.D.     ASSESSMENT AND PLAN  PULMONARY  Lab 05/15/12 0355 05/14/12 1132  PHART 7.437 7.369  PCO2ART 36.3 36.9  PO2ART 74.9* 195.0*  HCO3 24.0 20.8  O2SAT 97.2 99.3   ETT:   10/06>>10/9  CT chest 10/05>>Multiple b/l PE, changes of MM in thoracic spine and manubrium, basilar ATX Doppler legs 10/06>>Acute DVT Rt posterior tibial and peroneal veins, and Lt common femoral, saphenofemoral junction, and posterior tibial veins.  A:  Acute respiratory failure 2nd to PE and perforated sigmoid colon.  S/p IVC by IR 10/06. Extubated on 10/9. Low volume film w/ some element of overload  P:   F/u CXR Titrate oxygen to keep SpO2 > 92% Continue heparin gtt>>will eventually need to transition to coumadin, and have IV filter removed when more stable and on coumadin reliably Will need to f/u doppler legs week of 10/14 pulm hygiene , IS most important, upright position when able Diurese as tolerated   CARDIOVASCULAR  Lab 05/13/12 1821  TROPONINI <0.30  LATICACIDVEN --  PROBNP --   Lines:  Rt [place] CVL 10/05>>  Echo 10/07>>mod LV systolic dysfx, mild LVH, nl RV systolic fx  A: Hx of HTN. SVT>>new 10/08>>improved 10/09. Volume overload. P:  Diltiazem protocol>>goal HR < 105 Keep in even to negative fluid balance  RENAL  Lab 05/18/12 0500 05/17/12 0526 05/16/12 0407 05/15/12 0435 05/14/12 0530  NA 136 140 137 134* 135  K 3.3* 3.3* -- -- --  CL 102 105 104 102 101  CO2 26 25 24 25 24   BUN 6 12 9 9 16   CREATININE 0.28* 0.35* 0.32* <0.20* 0.38*  CALCIUM 6.5* 6.5* 6.6* 7.1* 8.0*  MG --  2.3 2.1 2.2 2.4  PHOS -- -- -- 2.1* 3.4   Intake/Output      10/09 0701 - 10/10 0700 10/10 0701 - 10/11 0700   I.V. (mL/kg) 1573.4 (15.8)    NG/GT     IV Piggyback 520    Total Intake(mL/kg) 2093.4 (21)    Urine (mL/kg/hr) 3590 (1.5)    Emesis/NG output 0    Total Output 3590    Net -1496.6           Intake/Output Summary (Last 24 hours) at 05/18/12 0940 Last data filed at 05/18/12 0600  Gross per 24 hour  Intake 1831.42 ml  Output   3490 ml  Net -1658.58 ml   Foley:  none  A: Hypokalemia. P:   Replace and recheck    GASTROINTESTINAL  Lab 05/13/12 1821  AST  11  ALT 6  ALKPHOS 99  BILITOT 0.4  PROT 6.8  ALBUMIN 3.3*   CT abd/pelvis 10/05>>free air, partial SBO, b/l inguinal hernias, changes of MM Lt iliac bone  A:  Perforated sigmoid colon likely 2nd to severe constipation from opiate use. Bloody NG tube outpt>>appears to have decreased. P:   Post-op care and nutrition per CCS, starting clears  Continue protonix BID until sure no further bloody NG outpt If bleeding recurs, then may need GI to do EGD  HEMATOLOGIC  Lab 05/18/12 0500 05/17/12 0526 05/16/12 0407 05/15/12 0435 05/14/12 1131 05/14/12 0530 05/13/12 1821  HGB 9.8* 9.0* 9.1* 9.4* -- 10.3* --  HCT 28.9* 26.8* 27.1* 28.0* -- 30.2* --  PLT 137* 131* 121* 123* -- 111* --  INR -- -- -- -- -- -- 1.23  APTT -- -- -- -- 28 -- --   A:  Anemia and thrombocytopenia in setting of multiple myeloma and critical illness.  Followed by Dr. Myna Hidalgo as outpt.  On zometa and velcade with decadron as outpt. P:  F/u CBC Transfuse for Hb < 7  INFECTIOUS  Lab 05/18/12 0500 05/17/12 0526 05/16/12 0407 05/15/12 0435 05/14/12 0530  WBC 3.1* 3.1* 3.8* 4.6 3.4*  PROCALCITON -- -- -- -- --   Cultures: Blood 10/05: gpr/gpc (from CVL; one of two)>>>  Antibiotics: Levaquin 10/05>>10/05 Meropenem 10/05>> Flagyl 10/05>>10/05 Vancomycin 10/05>>10/07  A: Peritonitis from sigmoid colon perforation.     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 Hyperglycemia. P:   Continue solucortef>>changed to 50 mg q8h, will change back to home decadron dose.  SSI  NEUROLOGIC  A: Chronic pain. P:   PRN fentanyl    Will change to SDU status and ask triad to assume care Primary issues: Start coumadin and transition off heparin when can tolerate  pos Place PICC and remove central line Advance diet   Mcarthur Rossetti. Tyson Alias, MD, FACP Pgr: [phone] [facility]

## 2012-05-18 NOTE — Evaluation (Signed)
Physical Therapy Evaluation Patient Details Name: Robert Berger MRN: [id number] DOB: 1941-05-02 Today's Date: 05/18/2012 Time: 4098-1191 PT Time Calculation (min): 10 min  PT Assessment / Plan / Recommendation Clinical Impression  Pt. was admitted 10/5 with SOB, found to have bil. PE and DVT. S/p IVC filter and exploratory lap with colostomy , VDRF, extubated on 10/9.Pt. has H/O multiple myelpma. Per wife  pt has been bedbound for several weeks. Wife wants to take care of pt. at home if possible . PtT will continue for bed mobility training and assist with DC plans and equipment.     PT Assessment  Patient needs continued PT services    Follow Up Recommendations  Home health PT (unless SNF is decided upon.)    Does the patient have the potential to tolerate intense rehabilitation      Barriers to Discharge Decreased caregiver support      Equipment Recommendations  Wheelchair (measurements);Wheelchair cushion (measurements) (? mechanical lift)    Recommendations for Other Services     Frequency Min 3X/week    Precautions / Restrictions Precautions Precaution Comments: coclstomy, bed boubd PTA   Pertinent Vitals/Pain 8/10, RN to give meds.      Mobility  Bed Mobility Bed Mobility: Not assessed Details for Bed Mobility Assistance: due to pt pain level at present. Transfers Transfers: Not assessed Details for Transfer Assistance: Pt will most likely not be able to get oob. Chair position may be most he will tolerate eventually. Ambulation/Gait Ambulation/Gait Assistance:  (non ambulatory pTA)    Shoulder Instructions     Exercises     PT Diagnosis: Generalized weakness;Acute pain  PT Problem List: Decreased strength;Decreased activity tolerance;Decreased mobility;Decreased knowledge of use of DME;Pain PT Treatment Interventions: Functional mobility training;Therapeutic activities;Therapeutic exercise;Patient/family education   PT Goals Acute Rehab PT Goals PT  Goal Formulation: With patient/family Time For Goal Achievement: 06/01/12 Potential to Achieve Goals: Fair Pt will Roll Supine to Right Side: with mod assist PT Goal: Rolling Supine to Right Side - Progress: Goal set today Pt will Roll Supine to Left Side: with mod assist PT Goal: Rolling Supine to Left Side - Progress: Goal set today Pt will go Supine/Side to Sit: with +2 total assist (pt+= 40%) PT Goal: Supine/Side to Sit - Progress: Goal set today Pt will Sit at [facility] of Bed: with +2 total assist;3-5 min PT Goal: Sit at [employer] Of Bed - Progress: Goal set today Pt will Perform Home Exercise Program: with min assist PT Goal: Perform Home Exercise Program - Progress: Goal set today Additional Goals Additional Goal #1: wife will perform positioning and therapeutic exercise PT Goal: Additional Goal #1 - Progress: Goal set today  Visit Information  Last PT Received On: 05/18/12 Assistance Needed: +2    Subjective Data  Subjective: I can't roll now. I just had my gut split open. wife reports she wants to take care of him at home Patient Stated Goal: Per pt to return home.   Prior Functioning  Home Living Lives With: Spouse Available Help at Discharge: Home health;Available 24 hours/day (had 1 x week ) Type of Home: House Home Access: Stairs to enter [employer] of Steps: Pt transported into and out of home via stretcher Home Layout: One level Bathroom Shower/Tub: Other (comment) (Pt given bed baths) Bathroom Toilet:  (Pt utlilzes depends) Home Adaptive Equipment: Bedside commode/3-in-1;Walker - rolling Additional Comments: pt. has adjustable head/foot bed but not hospital bed. Prior Function Level of Independence: Needs assistance Needs Assistance: Bathing;Dressing;Toileting;Meal Prep;Light Housekeeping  Bath: Total Dressing: Total Toileting: Total Meal Prep: Total Light Housekeeping: Total Driving: No Vocation: Retired Comments: Per wife pt has been bedbound and  total care except for feeding himself , wife reports that he could assist with rolling and scooting up in bed. Has not been OOB for several weeks except by EMS Communication Communication: No difficulties    Cognition  Overall Cognitive Status: Appears within functional limits for tasks assessed/performed Arousal/Alertness: Awake/alert Orientation Level: Appears intact for tasks assessed Behavior During Session: [facility] for tasks performed    Extremity/Trunk Assessment Right Upper Extremity Assessment RUE ROM/Strength/Tone: Unable to fully assess;Due to pain Left Upper Extremity Assessment LUE ROM/Strength/Tone: Unable to fully assess;Due to pain Right Lower Extremity Assessment RLE ROM/Strength/Tone: Unable to fully assess;Due to pain RLE ROM/Strength/Tone Deficits: pt is in too much pain, did assist to move leg  to remove pillow  RLE Sensation: WFL - Light Touch Left Lower Extremity Assessment LLE ROM/Strength/Tone: Unable to fully assess;Due to pain   Balance    End of Session    GP     Rada Hay 05/18/2012, 9:24 AM  [phone]

## 2012-05-19 ENCOUNTER — Ambulatory Visit: Payer: Medicare Other | Admitting: Internal Medicine

## 2012-05-19 DIAGNOSIS — F3289 Other specified depressive episodes: Secondary | ICD-10-CM

## 2012-05-19 DIAGNOSIS — F32A Depression, unspecified: Secondary | ICD-10-CM | POA: Diagnosis present

## 2012-05-19 DIAGNOSIS — R5381 Other malaise: Secondary | ICD-10-CM

## 2012-05-19 DIAGNOSIS — F329 Major depressive disorder, single episode, unspecified: Secondary | ICD-10-CM | POA: Diagnosis present

## 2012-05-19 DIAGNOSIS — K219 Gastro-esophageal reflux disease without esophagitis: Secondary | ICD-10-CM

## 2012-05-19 LAB — COMPREHENSIVE METABOLIC PANEL
ALT: 56 U/L — ABNORMAL HIGH (ref 0–53)
Albumin: 1.9 g/dL — ABNORMAL LOW (ref 3.5–5.2)
Alkaline Phosphatase: 96 U/L (ref 39–117)
Chloride: 101 mEq/L (ref 96–112)
Glucose, Bld: 155 mg/dL — ABNORMAL HIGH (ref 70–99)
Potassium: 3.5 mEq/L (ref 3.5–5.1)
Sodium: 135 mEq/L (ref 135–145)
Total Bilirubin: 0.7 mg/dL (ref 0.3–1.2)
Total Protein: 4.8 g/dL — ABNORMAL LOW (ref 6.0–8.3)

## 2012-05-19 LAB — GLUCOSE, CAPILLARY
Glucose-Capillary: 271 mg/dL — ABNORMAL HIGH (ref 70–99)
Glucose-Capillary: 296 mg/dL — ABNORMAL HIGH (ref 70–99)

## 2012-05-19 LAB — CBC
HCT: 27.2 % — ABNORMAL LOW (ref 39.0–52.0)
Hemoglobin: 9.5 g/dL — ABNORMAL LOW (ref 13.0–17.0)
MCHC: 34.9 g/dL (ref 30.0–36.0)
RDW: 17.4 % — ABNORMAL HIGH (ref 11.5–15.5)
WBC: 3.4 10*3/uL — ABNORMAL LOW (ref 4.0–10.5)

## 2012-05-19 LAB — HEPARIN LEVEL (UNFRACTIONATED): Heparin Unfractionated: 0.62 IU/mL (ref 0.30–0.70)

## 2012-05-19 MED ORDER — GLUCERNA SHAKE PO LIQD
237.0000 mL | Freq: Three times a day (TID) | ORAL | Status: DC
  Administered 2012-05-19 – 2012-05-24 (×12): 237 mL via ORAL
  Filled 2012-05-19 (×18): qty 237

## 2012-05-19 MED ORDER — CITALOPRAM HYDROBROMIDE 20 MG PO TABS
20.0000 mg | ORAL_TABLET | Freq: Every day | ORAL | Status: DC
  Administered 2012-05-19 – 2012-05-23 (×5): 20 mg via ORAL
  Filled 2012-05-19 (×6): qty 1

## 2012-05-19 MED ORDER — CALCIUM GLUCONATE 10 % IV SOLN
1.0000 g | Freq: Once | INTRAVENOUS | Status: AC
  Administered 2012-05-19: 1 g via INTRAVENOUS
  Filled 2012-05-19: qty 10

## 2012-05-19 MED ORDER — ZOLPIDEM TARTRATE 5 MG PO TABS
5.0000 mg | ORAL_TABLET | Freq: Every evening | ORAL | Status: DC | PRN
  Administered 2012-05-19 – 2012-05-22 (×3): 5 mg via ORAL
  Filled 2012-05-19 (×4): qty 1

## 2012-05-19 MED ORDER — BOOST / RESOURCE BREEZE PO LIQD: 1.0000 | ORAL | Status: DC

## 2012-05-19 MED ORDER — INSULIN GLARGINE 100 UNIT/ML ~~LOC~~ SOLN
15.0000 [IU] | Freq: Every day | SUBCUTANEOUS | Status: DC
  Administered 2012-05-19 – 2012-05-20 (×2): 15 [IU] via SUBCUTANEOUS

## 2012-05-19 MED ORDER — FONDAPARINUX SODIUM 10 MG/0.8ML ~~LOC~~ SOLN
10.0000 mg | Freq: Every day | SUBCUTANEOUS | Status: DC
  Administered 2012-05-19 – 2012-05-23 (×5): 10 mg via SUBCUTANEOUS
  Filled 2012-05-19 (×5): qty 0.8

## 2012-05-19 NOTE — Care Management Note (Signed)
    Page 1 of 2   05/25/2012     9:03:03 AM   CARE MANAGEMENT NOTE 05/25/2012  Patient:  Robert Berger, Robert Berger   Account Number:  [id number]  Date Initiated:  05/15/2012  Documentation initiated by:  DAVIS,RHONDA  Subjective/Objective Assessment:   PATIENT WITH PERFORATED BOWEL REQUIRING OPEN EXP. LAP AND FORMATION OF COLOSTOMY, POST OP PATIENT UNABLE TO SUPPORT AIRWAY AND RESP STATUS INTUBATED AND BROUGHT TO ICU     Action/Plan:   FROM HOME   Anticipated DC Date:  05/24/2012   Anticipated DC Plan:  SKILLED NURSING FACILITY  In-house referral  NA      DC Planning Services  CM consult      PAC Choice  NA   Choice offered to / List presented to:  NA   DME arranged  NA      DME agency  NA     HH arranged  NA      HH agency  NA   Status of service:  Completed, signed off Medicare Important Message given?  NA - LOS <3 / Initial given by admissions (If response is "NO", the following Medicare IM given date fields will be blank) Date Medicare IM given:   Date Additional Medicare IM given:    Discharge Disposition:  SKILLED NURSING FACILITY  Per UR Regulation:  Reviewed for med. necessity/level of care/duration of stay  If discussed at Long Length of Stay Meetings, dates discussed:   05/23/2012    Comments:  05/24/12 Adonna Horsley RN,BSN NCM [phone] D/C SNF.  05/22/12 Saturnino Liew RN,BSN NCM [phone] SX-CLEARED.ADVANCED DIET.IVC FILTER TO BE REMOVED WHEN MD/RADIOLOGY FEEL APPROPRIATE.PT-SNF. 05/19/12 Shaquelle Hernon RN,BSN NCM [phone] TRANSFER FROM SDU.POD#5 SIGMOID COLECTOMY,OSTOMY,ADVANCING DIET-FULL LIQ.PE/DVT-ARIXTRA.PT-SNF.  [id number] Earlene Plater, RN, BSN, CCM: CHART REVIEWED AND UPDATED. NO DISCHARGE NEEDS PRESENT AT THIS TIME. CASE MANAGEMENT [phone]

## 2012-05-19 NOTE — Progress Notes (Signed)
OT Note:  PT spoke to me today to say that Mr Pienta has potential for ADLs.  Pt had needed total A when he was going through tx for multiple myeloma.  We will plan to see him tomorrow.  Lewis, OTR/L [phone] 05/19/2012

## 2012-05-19 NOTE — Progress Notes (Signed)
Nutrition Follow-up  Intervention:  Continue Glucerna tid.  Decrease resource Breeze to one time daily.  Discussed supplement use at home and need for 3 meals/3 snacks to maintain weight after d/c if intake remains low. Encouraged po as tolerated.  Assessment:   Pt s/p hartman's for perf diverticulitis.  Tolerating Full Liquid diet and looking forward to a solid diet.  Discussed all of the weight he has lost.    Diet Order:  Full Liquid with Resource Breeze tid, Glucerna Shake tid.  Meds: Scheduled Meds:   . antiseptic oral rinse  15 mL Mouth Rinse QID  . calcium gluconate  1 g Intravenous Once  . citalopram  20 mg Oral Daily  . dexamethasone  4 mg Oral Daily  . diltiazem  30 mg Oral QID  . feeding supplement  237 mL Oral TID BM  . feeding supplement  1 Container Oral TID BM  . fondaparinux (ARIXTRA) injection  10 mg Subcutaneous Daily  . furosemide  40 mg Intravenous Daily  . insulin aspart  0-15 Units Subcutaneous TID WC  . insulin aspart  0-5 Units Subcutaneous QHS  . meropenem (MERREM) IV  1 g Intravenous Q8H  . pantoprazole (PROTONIX) IV  40 mg Intravenous Q12H  . potassium chloride  40 mEq Oral Daily  . potassium phosphate IVPB (mmol)  30 mmol Intravenous Once  . zolpidem  5 mg Oral Once  . DISCONTD: insulin aspart  0-9 Units Subcutaneous Q4H   Continuous Infusions:   . sodium chloride 50 mL/hr at 05/19/12 0238  . DISCONTD: heparin 1,200 Units/hr (05/19/12 1610)   PRN Meds:.sodium chloride, fentaNYL, ondansetron, sodium chloride  Labs:  CMP     Component Value Date/Time   NA 135 05/19/2012 0425   NA 137 05/10/2012 1112   K 3.5 05/19/2012 0425   K 5.1* 05/10/2012 1112   CL 101 05/19/2012 0425   CL 96* 05/10/2012 1112   CO2 26 05/19/2012 0425   CO2 29 05/10/2012 1112   GLUCOSE 155* 05/19/2012 0425   GLUCOSE 193* 05/10/2012 1112   BUN 5* 05/19/2012 0425   BUN 13 05/10/2012 1112   CREATININE 0.21* 05/19/2012 0425   CREATININE 0.5* 05/10/2012 1112   CALCIUM 6.2*  05/19/2012 0425   CALCIUM 8.5 05/10/2012 1112   PROT 4.8* 05/19/2012 0425   PROT 5.9* 05/10/2012 1112   ALBUMIN 1.9* 05/19/2012 0425   AST 35 05/19/2012 0425   AST 43* 05/10/2012 1112   ALT 56* 05/19/2012 0425   ALKPHOS 96 05/19/2012 0425   ALKPHOS 113* 05/10/2012 1112   BILITOT 0.7 05/19/2012 0425   BILITOT 1.10 05/10/2012 1112   GFRNONAA >90 05/19/2012 0425   GFRAA >90 05/19/2012 0425     Intake/Output Summary (Last 24 hours) at 05/19/12 1348 Last data filed at 05/19/12 1015  Gross per 24 hour  Intake   1718 ml  Output   3651 ml  Net  -1933 ml    Weight Status:  97.6 kg- stable  Re-estimated needs:  2000-2200 kcal, 105-115 gm protein  Nutrition Dx:  Inadequate oral intake now related to decreased appetite AEB documented intake  Goal:  Tolerate diet to meet >95% estimated needs  Monitor:  Diet advancement and tolerance, intake, labs, weight   Oran Rein, RD, LDN Clinical Inpatient Dietitian Pager:  [phone] Weekend and after hours pager:  [phone]

## 2012-05-19 NOTE — Progress Notes (Signed)
Lab. notified RN that the Calcium was 6.2. MD to be notified

## 2012-05-19 NOTE — Progress Notes (Signed)
ANTICOAGULATION CONSULT NOTE - Follow Up Consult  Pharmacy Consult for Heparin Indication: pulmonary embolus and DVT  Allergies  Allergen Reactions  . Penicillins Swelling    Patient Measurements: Height: 5\' 10"  (177.8 cm) Weight: 215 lb 2.7 oz (97.6 kg) IBW/kg (Calculated) : 73   Vital Signs: Temp: 97.5 F (36.4 C) (10/11 0515) Temp src: Oral (10/11 0515) BP: 133/85 mmHg (10/11 0515) Pulse Rate: 98  (10/11 0515)  Labs:  Basename 05/19/12 0425 05/18/12 1322 05/18/12 0500 05/17/12 0526  HGB 9.5* -- 9.8* --  HCT 27.2* -- 28.9* 26.8*  PLT 155 -- 137* 131*  APTT -- -- -- --  LABPROT -- -- -- --  INR -- -- -- --  HEPARINUNFRC 0.62 0.57 0.56 --  CREATININE 0.21* -- 0.28* 0.35*  CKTOTAL -- -- -- --  CKMB -- -- -- --  TROPONINI -- -- -- --    Estimated Creatinine Clearance: 99.2 ml/min (by C-G formula based on Cr of 0.21).   Assessment:  70 yom with new bilateral PEs and bilateral DVTs s/p IVC filter placement and ex-lap, sigmoid colectomy, descending colostomy 10/6.   Pt was started on IV heparin post-op, but heparin was held d/t bloody drainage from NGT, now resolved, and heparin resumed 10/8.  Heparin level within goal range with infusion rate at 1200 units/hr.  No further bleeding or complications noted.  H/H low but stable.  Platelets improved to 155K.  Monitoring.  Goal of Therapy:  Heparin level 0.3-0.7 units/ml Monitor platelets by anticoagulation protocol: Yes   Plan:   Continue heparin IV infusion at 1200 units/hr  Daily heparin level and CBC  Continue to monitor H&H and platelets  F/u plan to transition to long-term anticoagulant therapy.  Oncology note recommends considering Xarelto, Lovenox or Arixtra over warfarin therapy.  Clance Boll, PharmD, BCPS Pager: [phone] 05/19/2012 7:53 AM

## 2012-05-19 NOTE — Progress Notes (Signed)
Patient ID: Robert Berger, male   DOB: 09-11-1940, 71 y.o.   MRN: [id number] 5 Days Post-Op  Subjective: Pt feels ok, just c/o weakness.  Tolerating clear liquids well without any nausea.  Objective: Vital signs in last 24 hours: Temp:  [97.5 F (36.4 C)-98.9 F (37.2 C)] 97.5 F (36.4 C) (10/11 0515) Pulse Rate:  [77-98] 98  (10/11 0515) Resp:  [16-20] 16  (10/11 0515) BP: (106-133)/(61-85) 133/85 mmHg (10/11 0515) SpO2:  [96 %-98 %] 98 % (10/11 0515) Weight:  [215 lb 2.7 oz (97.6 kg)] 215 lb 2.7 oz (97.6 kg) (10/11 0515) Last BM Date: 05/19/12  Intake/Output from previous day: 10/10 0701 - 10/11 0700 In: 2281 [P.O.:600; I.V.:1282; IV Piggyback:399] Out: 4951 [Urine:4950; Stool:1] Intake/Output this shift:    PE: Abd: soft, incision c/d/i with staples, +BS, ND, ostomy with stool and air, stoma pink  Lab Results:   Basename 05/19/12 0425 05/18/12 0500  WBC 3.4* 3.1*  HGB 9.5* 9.8*  HCT 27.2* 28.9*  PLT 155 137*   BMET  Basename 05/19/12 0425 05/18/12 0500  NA 135 136  K 3.5 3.3*  CL 101 102  CO2 26 26  GLUCOSE 155* 86  BUN 5* 6  CREATININE 0.21* 0.28*  CALCIUM 6.2* 6.5*   PT/INR No results found for this basename: LABPROT:2,INR:2 in the last 72 hours CMP     Component Value Date/Time   NA 135 05/19/2012 0425   NA 137 05/10/2012 1112   K 3.5 05/19/2012 0425   K 5.1* 05/10/2012 1112   CL 101 05/19/2012 0425   CL 96* 05/10/2012 1112   CO2 26 05/19/2012 0425   CO2 29 05/10/2012 1112   GLUCOSE 155* 05/19/2012 0425   GLUCOSE 193* 05/10/2012 1112   BUN 5* 05/19/2012 0425   BUN 13 05/10/2012 1112   CREATININE 0.21* 05/19/2012 0425   CREATININE 0.5* 05/10/2012 1112   CALCIUM 6.2* 05/19/2012 0425   CALCIUM 8.5 05/10/2012 1112   PROT 4.8* 05/19/2012 0425   PROT 5.9* 05/10/2012 1112   ALBUMIN 1.9* 05/19/2012 0425   AST 35 05/19/2012 0425   AST 43* 05/10/2012 1112   ALT 56* 05/19/2012 0425   ALKPHOS 96 05/19/2012 0425   ALKPHOS 113* 05/10/2012 1112   BILITOT 0.7  05/19/2012 0425   BILITOT 1.10 05/10/2012 1112   GFRNONAA >90 05/19/2012 0425   GFRAA >90 05/19/2012 0425   Lipase     Component Value Date/Time   LIPASE 28 03/09/2012 1100       Studies/Results: No results found.  Anti-infectives: Anti-infectives     Start     Dose/Rate Route Frequency Ordered Stop   05/14/12 1200   vancomycin (VANCOCIN) IVPB 1000 mg/200 mL premix  Status:  Discontinued        1,000 mg 200 mL/hr over 60 Minutes Intravenous Every 8 hours 05/14/12 1115 05/15/12 1038   05/13/12 2230   meropenem (MERREM) 1 g in sodium chloride 0.9 % 100 mL IVPB        1 g 200 mL/hr over 30 Minutes Intravenous 3 times per day 05/13/12 2158     05/13/12 2100   levofloxacin (LEVAQUIN) IVPB 750 mg  Status:  Discontinued        750 mg 100 mL/hr over 90 Minutes Intravenous Every 24 hours 05/13/12 2050 05/13/12 2155   05/13/12 2100   metroNIDAZOLE (FLAGYL) IVPB 500 mg  Status:  Discontinued        500 mg 100 mL/hr over 60 Minutes Intravenous Every 8  hours 05/13/12 2050 05/13/12 2155           Assessment/Plan  1. S/p Hartman's for perf diverticulitis  T. Gerkin - 05/13/2012  2. Multiple Myeloma  Seen by Dr. Ross Marcus 3. Bedridden 4. PE (pulmonary thromboembolism)   Doppler 05/13/2012 - bilateral DVT.   IVC filter placed by IR.   Was on IV heparin  On Arixtra now 5. Thrombocytopenia - improving  Plts - 155,000 - 05/19/2012  6. HTN (hypertension)  7. On steroids for multiple myeloma  Decadron, 4 mg, daily 8.  Has foley, will remove it tomorrow AM  Plan: 1. Advance to full liquid diet 2. PT working with patient in the bed. 3. Cont routine ostomy care.   LOS: 6 days    OSBORNE,KELLY E 05/19/2012, 9:45 AM Pager: [phone]  Ms. Ezzell (his mom - 31 yo), is in the room.  Mr. anil havard very good.  Progress diet and will ask social worker to see for planned discharge early next week.  Ovidio Kin, MD, [facility] Surgery Pager: [phone] Office phone:   [phone]

## 2012-05-19 NOTE — Progress Notes (Signed)
ANTIBIOTIC CONSULT NOTE - FOLLOW UP  Pharmacy Consult for Meropenem Indication: bowel perforation  Allergies  Allergen Reactions  . Penicillins Swelling    Patient Measurements: Height: 5\' 10"  (177.8 cm) Weight: 215 lb 2.7 oz (97.6 kg) IBW/kg (Calculated) : 73   Vital Signs: Temp: 97.5 F (36.4 C) (10/11 0515) Temp src: Oral (10/11 0515) BP: 133/85 mmHg (10/11 0515) Pulse Rate: 98  (10/11 0515) Intake/Output from previous day: 10/10 0701 - 10/11 0700 In: 987 [P.O.:360; I.V.:428; IV Piggyback:199] Out: 4951 [Urine:4950; Stool:1]  Labs:  [facility] 05/19/12 0425 05/18/12 0500 05/17/12 0526  WBC 3.4* 3.1* 3.1*  HGB 9.5* 9.8* 9.0*  PLT 155 137* 131*  LABCREA -- -- --  CREATININE 0.21* 0.28* 0.35*   Estimated Creatinine Clearance: 99.2 ml/min (by C-G formula based on Cr of 0.21).   Microbiology: 9/17 arm wound cx >> MSSA 10/5 blood x 2 >> 1/2 GPR and GPC in clusters 10/6 mrsa pcr >> negative  Assessment: 71 YOM on day #7 meropenem for perforated sigmoid colon in setting of multiple myeloma.  Also with possible line infection vs contamination (1/2 blood cx growing GPR and GPC in clusters - identifications pending).  Placed PICC yesterday and removed CVL.  SCr low (pt is bedridden), but renal function is stable.  WBC low 2/2 recent chemo.  Meropenem dose still appropriate.  Goal of Therapy:  eradication of infection, doses adjusted per renal clearance  Plan:  Continue Meropenem 1g IV q8h.  Clance Boll 05/19/2012,8:00 AM

## 2012-05-19 NOTE — Progress Notes (Signed)
Physical Therapy Treatment Patient Details Name: Robert Berger MRN: [id number] DOB: 1940/09/14 Today's Date: 05/19/2012 Time: 4098-1191 PT Time Calculation (min): 27 min  PT Assessment / Plan / Recommendation Comments on Treatment Session  Pt with generalized muscle atrophy and pain in back that prohibits sitting tolerance.  He hopes to be able to transition through this position to standing and walking.  Anticipate he would benefit from continued post acute inpatient rehab, but do not think he would be able to tolerate 3 hours of PT per day at CIR. Pt will consider going to short term SNF to get stronger    Follow Up Recommendations  Home health PT;Post acute inpatient (unless SNF is decided upon.)     Does the patient have the potential to tolerate intense rehabilitation  No, Recommend SNF  Barriers to Discharge        Equipment Recommendations   (? mechanical lift)    Recommendations for Other Services    Frequency Min 3X/week   Plan Discharge plan needs to be updated;Frequency remains appropriate    Precautions / Restrictions Precautions Precaution Comments: pt reports he had gotten too weak to walk from recent chemotherapy. Thats why he was bed bound Restrictions Weight Bearing Restrictions: No   Pertinent Vitals/Pain P t c/o pain in back with mobility that  Prevents him from sitting    Mobility  Bed Mobility Bed Mobility: Rolling Left;Rolling Right Rolling Right: 3: Mod assist Rolling Left: 3: Mod assist Details for Bed Mobility Assistance: pt needs assist to keep leg up in hip flexed, knee flexed position, From there, he is able to initate push with leg to get hip over and reach with UE for bedrail Transfers Transfers: Not assessed Ambulation/Gait Ambulation/Gait Assistance: Not tested (comment)    Exercises General Exercises - Lower Extremity Ankle Circles/Pumps: AROM;Both;10 reps;Supine Quad Sets: AROM;Both;10 reps;Supine Gluteal Sets: AROM;Both;10  reps;Supine Short Arc Quad: AROM;Both;10 reps;Supine Hip ABduction/ADduction: AAROM;Both;10 reps;Supine Hip Flexion/Marching: AAROM;Both;10 reps;Supine Shoulder Exercises Shoulder Flexion: AAROM;Both;5 reps;Supine Elbow Flexion: AROM;Both;5 reps;Supine Elbow Extension: AROM;Both;5 reps;Supine Other Exercises Other Exercises: bilateral UE hip to hip in supine to active core muscles   PT Diagnosis:    PT Problem List:   PT Treatment Interventions:     PT Goals Acute Rehab PT Goals PT Goal: Rolling Supine to Right Side - Progress: Progressing toward goal PT Goal: Rolling Supine to Left Side - Progress: Progressing toward goal PT Goal: Perform Home Exercise Program - Progress: Progressing toward goal  Visit Information  Last PT Received On: 05/19/12 Assistance Needed: +2    Subjective Data  Subjective: pt expresses his frustration at not being able to do anything for himself Patient Stated Goal: to walk again   Cognition  Overall Cognitive Status: Appears within functional limits for tasks assessed/performed Arousal/Alertness: Awake/alert Orientation Level: Appears intact for tasks assessed Behavior During Session: [facility] for tasks performed    Balance  Balance Balance Assessed: No  End of Session PT - End of Session Activity Tolerance: Patient limited by pain Patient left: in bed;with family/visitor present   GP    Rosey Bath K. [place], [place] [phone] 05/19/2012, 3:01 PM

## 2012-05-19 NOTE — Progress Notes (Signed)
OT Note:  Noted new order today.  Pt was seen for a one time eval yesterday.  He was total A for adls at home at baseline (except for feeding).  Will sign off.  [place], OTR/L [phone] 05/19/2012

## 2012-05-19 NOTE — Progress Notes (Signed)
[facility] PROGRESS NOTE  Draco Donate ZOX:[id number] DOB: 1941/04/28 DOA: 05/13/2012 PCP: [name], MD  Assessment/Plan: 1-Perforated bowel: due to diverticulitis; post surgery recommendations and diet advance to be decided by CCS. No distension, positive BS and just mild pain in the incision area. Continue meropenem  2-Multiple Myeloma: per oncology. Continue decadron  3-PE/DVT: after discussing with oncology service, decision was to start patient on arixtra; heparin drip discontinue today. IVC filter to be remove before discharge. No overt bleeding.  4-Resp distress and hypoxemia requiring ICU and prolonged intubation after surgery: now extubated since 10/9; good O2 sat. Will continue weaning oxygen down to RA as tolerated; will continue IS; heparin discontinued and patient started on arixtra.   5-PE: on arixtra now  6-depression: started on celexa  7-diabetes:continue SSI; now that diet is advance will also add lantus for better control. Steroids also playing a role in making more difficult to control his sugar. Will check A1C  8-GERD: on PPI  9-HTN: stable; will continue monitoring VS and will continue diltiazem  10-protein calorie malnutrition: continue glucerna and breeze; will also slowly continue advancing her diet as per CCS recommendations.  11-physical deconditioning: PT/OT evaluation; most likely SNF for physical rehab at discharge  WJX:[id number]  Code Status: Full Family Communication: no family at bedside Disposition Plan: most likely SNF for physical deconditioning at discharge  Consultants:  CCS  PT/OT  CCM  oncology  Procedures:  Exploratory laparotomy and hartman's pouch for bowel perforation due to diverticulitis  Antibiotics:  meropenem  HPI/Subjective: Afebrile; no CP, no abdominal pain. Really depressed and with generalize weakness.   Objective: Filed Vitals:   05/18/12 2132 05/19/12 0515 05/19/12 1428 05/19/12 2036  BP:  121/80 133/85 111/81 135/75  Pulse: 95 98 86 93  Temp: 98.3 F (36.8 C) 97.5 F (36.4 C) 97.8 F (36.6 C) 98 F (36.7 C)  TempSrc: Oral Oral Oral Oral  Resp: 18 16 18 16   Height:      Weight:  97.6 kg (215 lb 2.7 oz)    SpO2: 97% 98% 96% 97%    Intake/Output Summary (Last 24 hours) at 05/19/12 2242 Last data filed at 05/19/12 1826  Gross per 24 hour  Intake   2138 ml  Output   1500 ml  Net    638 ml   Filed Weights   05/17/12 0100 05/18/12 0548 05/19/12 0515  Weight: 100.9 kg (222 lb 7.1 oz) 99.7 kg (219 lb 12.8 oz) 97.6 kg (215 lb 2.7 oz)    Exam:   General:  NAD; afebrile  Cardiovascular: no rubs or gallops; S1 and S2  Respiratory: scattered rhonchi; no crackles  Abdomen: soft, no distended; clean incision, ostomy with pink color and no drainage or erythema; positive BS.  Neuro: flat affect, no focal deficit; due to poor effort and recent surgery patient MS is 3/5 bilaterally; no focal deficit.  Data Reviewed: Basic Metabolic Panel:  Lab 05/19/12 5621 05/18/12 0500 05/17/12 0526 05/16/12 0407 05/15/12 0435 05/14/12 0530  NA 135 136 140 137 134* --  K 3.5 3.3* 3.3* 3.5 4.2 --  CL 101 102 105 104 102 --  CO2 26 26 25 24 25  --  GLUCOSE 155* 86 118* 138* 183* --  BUN 5* 6 12 9 9  --  CREATININE 0.21* 0.28* 0.35* 0.32* <0.20* --  CALCIUM 6.2* 6.5* 6.5* 6.6* 7.1* --  MG -- -- 2.3 2.1 2.2 2.4  PHOS -- -- -- -- 2.1* 3.4   Liver Function Tests:  Lab  05/19/12 0425 05/13/12 1821  AST 35 11  ALT 56* 6  ALKPHOS 96 99  BILITOT 0.7 0.4  PROT 4.8* 6.8  ALBUMIN 1.9* 3.3*   CBC:  Lab 05/19/12 0425 05/18/12 0500 05/17/12 0526 05/16/12 0407 05/15/12 0435 05/13/12 1740  WBC 3.4* 3.1* 3.1* 3.8* 4.6 --  NEUTROABS -- -- -- -- -- 5.1  HGB 9.5* 9.8* 9.0* 9.1* 9.4* --  HCT 27.2* 28.9* 26.8* 27.1* 28.0* --  MCV 92.5 93.5 95.0 94.8 94.6 --  PLT 155 137* 131* 121* 123* --   Cardiac Enzymes:  Lab 05/13/12 1821  CKTOTAL --  CKMB --  CKMBINDEX --  TROPONINI <0.30    CBG:  Lab 05/19/12 2034 05/19/12 1636 05/19/12 1211 05/19/12 0739 05/18/12 2145  GLUCAP 296* 271* 203* 141* 204*    Recent Results (from the past 240 hour(s))  CULTURE, BLOOD (ROUTINE X 2)     Status: Normal (Preliminary result)   Collection Time   05/13/12  9:40 PM      Component Value Range Status Comment   Specimen Description BLOOD LEFT ANTECUBITAL   Final    Special Requests BOTTLES DRAWN AEROBIC AND ANAEROBIC 5CC   Final    Culture  Setup Time 05/14/2012 11:36   Final    Culture     Final    Value:        BLOOD CULTURE RECEIVED NO GROWTH TO DATE CULTURE WILL BE HELD FOR 5 DAYS BEFORE ISSUING A FINAL NEGATIVE REPORT   Report Status PENDING   Incomplete   CULTURE, BLOOD (ROUTINE X 2)     Status: Normal (Preliminary result)   Collection Time   05/13/12 10:20 PM      Component Value Range Status Comment   Specimen Description BLOOD RIGHT CVL   Final    Special Requests     Final    Value: BOTTLES DRAWN AEROBIC AND ANAEROBIC ANA 1CC AER 5CC   Culture  Setup Time 05/14/2012 11:38   Final    Culture     Final    Value: DIPHTHEROIDS(CORYNEBACTERIUM SPECIES)     Note: Standardized susceptibility testing for this organism is not available.     GRAM POSITIVE COCCI IN CLUSTERS     Note: Gram Stain Report Called to,Read Back By and Verified With: STACEY AT 1956 107/13 BY SNOLO Gram Stain Report Called to,Read Back By and Verified With: STACEY PHELPS @0533  ON 05/16/2012 BY MCLET   Report Status PENDING   Incomplete   MRSA PCR SCREENING     Status: Normal   Collection Time   05/14/12  3:05 AM      Component Value Range Status Comment   MRSA by PCR NEGATIVE  NEGATIVE Final      Studies: No results found.  Scheduled Meds:   . antiseptic oral rinse  15 mL Mouth Rinse QID  . calcium gluconate  1 g Intravenous Once  . citalopram  20 mg Oral Daily  . dexamethasone  4 mg Oral Daily  . diltiazem  30 mg Oral QID  . feeding supplement  237 mL Oral TID BM  . feeding supplement  1  Container Oral Q24H  . fondaparinux (ARIXTRA) injection  10 mg Subcutaneous Daily  . furosemide  40 mg Intravenous Daily  . insulin aspart  0-15 Units Subcutaneous TID WC  . insulin aspart  0-5 Units Subcutaneous QHS  . meropenem (MERREM) IV  1 g Intravenous Q8H  . pantoprazole (PROTONIX) IV  40 mg Intravenous Q12H  .  potassium chloride  40 mEq Oral Daily  . zolpidem  5 mg Oral Once  . DISCONTD: feeding supplement  1 Container Oral TID BM   Continuous Infusions:   . sodium chloride 50 mL/hr at 05/19/12 0238  . DISCONTD: heparin 1,200 Units/hr (05/19/12 6295)    Time spent: >30 minutes    Compton Brigance  [facility] Pager [phone]. If 8PM-8AM, please contact night-coverage at [email or web address], password [facility] 05/19/2012, 10:42 PM  LOS: 6 days

## 2012-05-20 LAB — PHOSPHORUS: Phosphorus: 1.2 mg/dL — ABNORMAL LOW (ref 2.3–4.6)

## 2012-05-20 LAB — BASIC METABOLIC PANEL
CO2: 28 mEq/L (ref 19–32)
Calcium: 7.1 mg/dL — ABNORMAL LOW (ref 8.4–10.5)
Sodium: 137 mEq/L (ref 135–145)

## 2012-05-20 LAB — CBC
HCT: 28.9 % — ABNORMAL LOW (ref 39.0–52.0)
Hemoglobin: 9.9 g/dL — ABNORMAL LOW (ref 13.0–17.0)
RBC: 3.07 MIL/uL — ABNORMAL LOW (ref 4.22–5.81)
RDW: 17.9 % — ABNORMAL HIGH (ref 11.5–15.5)
WBC: 4.4 10*3/uL (ref 4.0–10.5)

## 2012-05-20 LAB — CULTURE, BLOOD (ROUTINE X 2): Culture: NO GROWTH

## 2012-05-20 LAB — GLUCOSE, CAPILLARY: Glucose-Capillary: 146 mg/dL — ABNORMAL HIGH (ref 70–99)

## 2012-05-20 MED ORDER — SODIUM PHOSPHATE 3 MMOLE/ML IV SOLN
20.0000 mmol | Freq: Once | INTRAVENOUS | Status: AC
  Administered 2012-05-20: 20 mmol via INTRAVENOUS
  Filled 2012-05-20: qty 6.67

## 2012-05-20 MED ORDER — DEXAMETHASONE 2 MG PO TABS
2.0000 mg | ORAL_TABLET | Freq: Every day | ORAL | Status: DC
  Administered 2012-05-20 – 2012-05-22 (×3): 2 mg via ORAL
  Filled 2012-05-20 (×4): qty 1

## 2012-05-20 NOTE — Progress Notes (Addendum)
[facility] PROGRESS NOTE  Robert Berger EAV:[id number] DOB: July 25, 1941 DOA: 05/13/2012 PCP: [name], MD  Assessment/Plan: 1-Perforated bowel: due to diverticulitis; post surgery recommendations and diet advancing orders to be decided by CCS. No distension, positive BS and just mild pain in the incision area. Continue meropenem (imagine that the plan is to complete a total of 1-2 weeks of antibiotics); but surgery team to help determining length of therapy.  2-Multiple Myeloma: per oncology. Continue decadron (dose adjusted down to 2 mg daily). Will follow any further rec's and appreciate oncology service assistance   3-PE/DVT: after discussing with oncology service, decision was to start patient on arixtra; heparin drip discontinue today. IVC filter to be remove before discharge. No overt bleeding. Close follow up of his Hgb. Today 9.9  4-Resp distress and hypoxemia requiring ICU and prolonged intubation after surgery: now extubated since 10/9; good O2 sat. Will continue weaning oxygen down to RA as tolerated; will continue IS; heparin discontinued and patient started on arixtra.   5-PE: on arixtra now; will look to remove IVC filter on Monday or so. Will discuss with IR.  6-depression: continue celexa  7-diabetes:continue SSI; now that diet is advance will continue lantus for better control and will adjust as needed. Steroids also playing a role in making more difficult to control his sugar; oncology has reduce his decadron to 2mg  instead of 4mg , This will definitely help with CBG controls. A1C pending. After lantus was added CBG's has been less than 200 and no hypoglycemia.  8-GERD: on PPI  9-HTN: stable; will continue monitoring BP and will continue diltiazem  10-protein calorie malnutrition: continue glucerna and breeze; will also slowly continue advancing her diet as per CCS recommendations.  11-physical deconditioning: Continue PT/OT; Per PT and OT recommendations  patient will require SNF for physical rehab at discharge. He is in agreement and motivated to get better. Unfortunately unable to tolerate at this moment intense 3 plus hours a day of CIR.  NWG:[id number]  Code Status: Full Family Communication: no family at bedside Disposition Plan: most likely SNF for physical deconditioning at discharge  Consultants:  CCS  PT/OT  CCM  oncology  Procedures:  Exploratory laparotomy and hartman's pouch for bowel perforation due to diverticulitis  Antibiotics:  meropenem  HPI/Subjective: Afebrile; no CP, no abdominal pain. Flat affect and with generalize weakness.    Objective: Filed Vitals:   05/20/12 0526 05/20/12 1000 05/20/12 1109 05/20/12 1420  BP: 126/77   127/76  Pulse: 92   95  Temp: 98.6 F (37 C)   98.2 F (36.8 C)  TempSrc: Oral   Oral  Resp: 18   22  Height:      Weight:      SpO2: 97% 97% 95% 95%    Intake/Output Summary (Last 24 hours) at 05/20/12 2055 Last data filed at 05/20/12 1848  Gross per 24 hour  Intake 1611.67 ml  Output   3411 ml  Net -1799.33 ml   Filed Weights   05/17/12 0100 05/18/12 0548 05/19/12 0515  Weight: 100.9 kg (222 lb 7.1 oz) 99.7 kg (219 lb 12.8 oz) 97.6 kg (215 lb 2.7 oz)    Exam:   General:  NAD; afebrile  Cardiovascular: no rubs or gallops; S1 and S2  Respiratory: scattered rhonchi; no crackles  Abdomen: soft, no distended; clean incision, ostomy with pink color and no drainage or erythema; positive BS; also with brown stools inside colostomy bag.  Neuro: flat affect, no focal deficit; due to poor effort  and recent surgery patient MS is 3/5 bilaterally; no focal deficit.  Data Reviewed: Basic Metabolic Panel:  Lab 05/20/12 7829 05/19/12 0425 05/18/12 0500 05/17/12 0526 05/16/12 0407 05/15/12 0435 05/14/12 0530  NA 137 135 136 140 137 -- --  K 4.2 3.5 3.3* 3.3* 3.5 -- --  CL 102 101 102 105 104 -- --  CO2 28 26 26 25 24  -- --  GLUCOSE 124* 155* 86 118* 138* -- --  BUN  8 5* 6 12 9  -- --  CREATININE 0.28* 0.21* 0.28* 0.35* 0.32* -- --  CALCIUM 7.1* 6.2* 6.5* 6.5* 6.6* -- --  MG 2.0 -- -- 2.3 2.1 2.2 2.4  PHOS 1.2* -- -- -- -- 2.1* 3.4   Liver Function Tests:  Lab 05/19/12 0425  AST 35  ALT 56*  ALKPHOS 96  BILITOT 0.7  PROT 4.8*  ALBUMIN 1.9*   CBC:  Lab 05/20/12 0500 05/19/12 0425 05/18/12 0500 05/17/12 0526 05/16/12 0407  WBC 4.4 3.4* 3.1* 3.1* 3.8*  NEUTROABS -- -- -- -- --  HGB 9.9* 9.5* 9.8* 9.0* 9.1*  HCT 28.9* 27.2* 28.9* 26.8* 27.1*  MCV 94.1 92.5 93.5 95.0 94.8  PLT 169 155 137* 131* 121*   CBG:  Lab 05/20/12 1145 05/20/12 0737 05/19/12 2034 05/19/12 1636 05/19/12 1211  GLUCAP 142* 121* 296* 271* 203*    Recent Results (from the past 240 hour(s))  CULTURE, BLOOD (ROUTINE X 2)     Status: Normal   Collection Time   05/13/12  9:40 PM      Component Value Range Status Comment   Specimen Description BLOOD LEFT ANTECUBITAL   Final    Special Requests BOTTLES DRAWN AEROBIC AND ANAEROBIC 5CC   Final    Culture  Setup Time 05/14/2012 11:36   Final    Culture NO GROWTH 5 DAYS   Final    Report Status 05/20/2012 FINAL   Final   CULTURE, BLOOD (ROUTINE X 2)     Status: Normal   Collection Time   05/13/12 10:20 PM      Component Value Range Status Comment   Specimen Description BLOOD RIGHT CVL   Final    Special Requests     Final    Value: BOTTLES DRAWN AEROBIC AND ANAEROBIC ANA 1CC AER 5CC   Culture  Setup Time 05/14/2012 11:38   Final    Culture     Final    Value: DIPHTHEROIDS(CORYNEBACTERIUM SPECIES)     Note: Standardized susceptibility testing for this organism is not available.     STAPHYLOCOCCUS SPECIES (COAGULASE NEGATIVE)     Note: THE SIGNIFICANCE OF ISOLATING THIS ORGANISM FROM A SINGLE SET OF BLOOD CULTURES WHEN MULTIPLE SETS ARE DRAWN IS UNCERTAIN. PLEASE NOTIFY THE MICROBIOLOGY DEPARTMENT WITHIN ONE WEEK IF SPECIATION AND SENSITIVITIES ARE REQUIRED.     Note: Gram Stain Report Called to,Read Back By and Verified  With: STACEY AT 1956 107/13 BY SNOLO Gram Stain Report Called to,Read Back By and Verified With: STACEY PHELPS @0533  ON 05/16/2012 BY MCLET   Report Status 05/20/2012 FINAL   Final   MRSA PCR SCREENING     Status: Normal   Collection Time   05/14/12  3:05 AM      Component Value Range Status Comment   MRSA by PCR NEGATIVE  NEGATIVE Final      Studies: No results found.  Scheduled Meds:    . citalopram  20 mg Oral Daily  . dexamethasone  2 mg Oral Daily  .  diltiazem  30 mg Oral QID  . feeding supplement  237 mL Oral TID BM  . feeding supplement  1 Container Oral Q24H  . fondaparinux (ARIXTRA) injection  10 mg Subcutaneous Daily  . furosemide  40 mg Intravenous Daily  . insulin aspart  0-15 Units Subcutaneous TID WC  . insulin aspart  0-5 Units Subcutaneous QHS  . insulin glargine  15 Units Subcutaneous QHS  . meropenem (MERREM) IV  1 g Intravenous Q8H  . pantoprazole (PROTONIX) IV  40 mg Intravenous Q12H  . potassium chloride  40 mEq Oral Daily  . sodium phosphate  Dextrose 5% IVPB  20 mmol Intravenous Once  . DISCONTD: antiseptic oral rinse  15 mL Mouth Rinse QID  . DISCONTD: dexamethasone  4 mg Oral Daily   Continuous Infusions:    . sodium chloride 50 mL/hr at 05/20/12 0151    Time spent: >30 minutes    Rexine Gowens  [facility] Pager [phone]. If 8PM-8AM, please contact night-coverage at [email or web address], password [facility] 05/20/2012, 8:55 PM  LOS: 7 days

## 2012-05-20 NOTE — Progress Notes (Signed)
Robert Berger is improving slowly. He is quite alert this morning. He is trying to eat more. Pain wise he is having some discomfort in the abdomen. Is not complaining of any back pain.  The real issue for Robert Berger is going to be physical therapy. Rehabilitation is going to be crucial for him. This I think is going to be and must be the focus of his care.  Blood sugars have been high. I am decreasing his Decadron down to 2 mg a day.  He is on Arixtra. He is on 10 mg a day. This will need to be continued for at least a year in my mind. He's not complain of any dyspnea.  His blood counts all stable. His phosphate is quite low. Pharmacy can help readjust this. His renal function is okay. His hemoglobin is holding stable at 9.9.  On physical exam he is afebrile. Blood pressure is 126/77. His lungs are clear bilaterally. Cardiac exam regular rate and rhythm with no murmurs rubs or bruits. Abdominal exam soft. He does have some bowel sounds. 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 He has a dressing on the abdomen. Extremities shows some trace edema in his legs bilaterally.  Again, Robert Berger is going to need a lot of rehabilitation. I really think that inpatient rehabilitation would be best. He is motivated.  As long as he is on Arixtra, this really should be adequate for his DVT and PE.  He continues on IV antibiotics. Am I sure how much longer these need to be continued.  His myeloma is a minor issue at this point. His rehabilitation and his thromboembolic events are much more important problem that needs to be addressed while he is here and not as an outpatient.  Pete e.

## 2012-05-20 NOTE — Progress Notes (Signed)
6 Days Post-Op  Subjective: A little abdominal soreness.  Not very hungry.  Objective: Vital signs in last 24 hours: Temp:  [97.8 F (36.6 C)-98.6 F (37 C)] 98.6 F (37 C) (10/12 0526) Pulse Rate:  [86-93] 92  (10/12 0526) Resp:  [16-18] 18  (10/12 0526) BP: (111-135)/(75-81) 126/77 mmHg (10/12 0526) SpO2:  [95 %-97 %] 95 % (10/12 1109) Last BM Date: 05/20/12  Intake/Output from previous day: 10/11 0701 - 10/12 0700 In: 1975.7 [P.O.:360; I.V.:1191.7; IV Piggyback:424] Out: 1360 [Urine:1325; Stool:35] Intake/Output this shift: Total I/O In: 240 [P.O.:240] Out: 1275 [Urine:1275]  PE: Abd-soft, incision clean and intact with staples, colostomy pink and viable with brown stool output  Lab Results:   Basename 05/20/12 0500 05/19/12 0425  WBC 4.4 3.4*  HGB 9.9* 9.5*  HCT 28.9* 27.2*  PLT 169 155   BMET  Basename 05/20/12 0500 05/19/12 0425  NA 137 135  K 4.2 3.5  CL 102 101  CO2 28 26  GLUCOSE 124* 155*  BUN 8 5*  CREATININE 0.28* 0.21*  CALCIUM 7.1* 6.2*   PT/INR No results found for this basename: LABPROT:2,INR:2 in the last 72 hours Comprehensive Metabolic Panel:    Component Value Date/Time   NA 137 05/20/2012 0500   NA 137 05/10/2012 1112   K 4.2 05/20/2012 0500   K 5.1* 05/10/2012 1112   CL 102 05/20/2012 0500   CL 96* 05/10/2012 1112   CO2 28 05/20/2012 0500   CO2 29 05/10/2012 1112   BUN 8 05/20/2012 0500   BUN 13 05/10/2012 1112   CREATININE 0.28* 05/20/2012 0500   CREATININE 0.5* 05/10/2012 1112   GLUCOSE 124* 05/20/2012 0500   GLUCOSE 193* 05/10/2012 1112   CALCIUM 7.1* 05/20/2012 0500   CALCIUM 8.5 05/10/2012 1112   AST 35 05/19/2012 0425   AST 43* 05/10/2012 1112   ALT 56* 05/19/2012 0425   ALKPHOS 96 05/19/2012 0425   ALKPHOS 113* 05/10/2012 1112   BILITOT 0.7 05/19/2012 0425   BILITOT 1.10 05/10/2012 1112   PROT 4.8* 05/19/2012 0425   PROT 5.9* 05/10/2012 1112   ALBUMIN 1.9* 05/19/2012 0425     Studies/Results: No results  found.  Anti-infectives: Anti-infectives     Start     Dose/Rate Route Frequency Ordered Stop   05/14/12 1200   vancomycin (VANCOCIN) IVPB 1000 mg/200 mL premix  Status:  Discontinued        1,000 mg 200 mL/hr over 60 Minutes Intravenous Every 8 hours 05/14/12 1115 05/15/12 1038   05/13/12 2230   meropenem (MERREM) 1 g in sodium chloride 0.9 % 100 mL IVPB        1 g 200 mL/hr over 30 Minutes Intravenous 3 times per day 05/13/12 2158     05/13/12 2100   levofloxacin (LEVAQUIN) IVPB 750 mg  Status:  Discontinued        750 mg 100 mL/hr over 90 Minutes Intravenous Every 24 hours 05/13/12 2050 05/13/12 2155   05/13/12 2100   metroNIDAZOLE (FLAGYL) IVPB 500 mg  Status:  Discontinued        500 mg 100 mL/hr over 60 Minutes Intravenous Every 8 hours 05/13/12 2050 05/13/12 2155          Assessment . S/p Hartman's for perf diverticulitis-progressing fairly well  T. Gerkin - 05/13/2012  2. Multiple Myeloma  Seen by Dr. Ross Marcus  3. Bedridden  4. PE (pulmonary thromboembolism)  Doppler 05/13/2012 - bilateral DVT.  IVC filter placed by IR.  Was  on IV heparin  On Arixtra now  5. Thrombocytopenia - improving   Physical deconditioning      LOS: 7 days   Plan:  To SNF hopefully next week.   Robert Berger 05/20/2012

## 2012-05-20 NOTE — Evaluation (Signed)
Occupational Therapy Re-Evaluation Patient Details Name: Gradie Trumpy MRN: [id number] DOB: 03-27-1941 Today's Date: 05/20/2012 Time: 4098-1191 OT Time Calculation (min): 38 min  OT Assessment / Plan / Recommendation Clinical Impression  This 71 year old man was admitted for bowel perforation.  He has a h/o multiple myeloma and has been bedbound from weakness from CA txs, needing total A for ADLs.  Pt is motivated and wants to increase strength.  We will follow him in acute for strengthening and activity tolerance to decrease burden of care.  .    OT Assessment  Patient needs continued OT Services    Follow Up Recommendations  Home health OT;Skilled nursing facility    Barriers to Discharge      Equipment Recommendations  Wheelchair (measurements);Wheelchair cushion (measurements)    Recommendations for Other Services    Frequency  Min 1X/week    Precautions / Restrictions Precautions Precaution Comments: pt reports he had gotten too weak to walk from recent chemotherapy. Thats why he was bed bound Restrictions Weight Bearing Restrictions: No   Pertinent Vitals/Pain Incisional pain, abdomen with movement:  Adjusted activity    ADL  Grooming: Performed;Set up;Wash/dry face Transfers/Ambulation Related to ADLs: Rolls to bil sides with mod A and rails ADL Comments: New order received, and PT asked OT to relook at Mr. Mcglothlin, stating that although he was bedbound, this was due to weakness from CA tx.  Pt is motivated and would like to participate in OT/PT for strengthening.  Pt had just finished bath.  At this time, he has been requiring total A, but he has potential to do more.  He is limited by endurance as well as pain from incision.  Assisted nursing with rolling to use maxilift to get up in chair.  Pt cannot tolerate sitting straight up because of incisional pain.  Worked with positioning to help sitting tolerance in chair.      OT Diagnosis: Generalized weakness  OT Problem  List: Decreased strength;Decreased activity tolerance;Pain OT Treatment Interventions: Self-care/ADL training;Patient/family education;Therapeutic exercise;Therapeutic activities   OT Goals Acute Rehab OT Goals OT Goal Formulation: With patient Time For Goal Achievement: 06/03/12 Potential to Achieve Goals: Good ADL Goals Pt Will Perform Grooming: with min assist;Supported;Sitting, chair;Supine, head of bed up (x 3 tasks) ADL Goal: Grooming - Progress: Goal set today Pt Will Perform Upper Body Bathing: with min assist;Sitting, chair;Supine, head of bed up ADL Goal: Upper Body Bathing - Progress: Goal set today Pt Will Perform Upper Body Dressing: with mod assist;Sitting, chair;Supine, head of bed up ADL Goal: Upper Body Dressing - Progress: Goal set today Miscellaneous OT Goals Miscellaneous OT Goal #1: Pt will perform 10 reps of AROM to bil shoulders, with up to 2 rest breaks to increase strength and endurance for adls OT Goal: Miscellaneous Goal #1 - Progress: Goal set today Miscellaneous OT Goal #2: Pt will sit away from back of chair with mod trunk support to increase strength for adls/bed mobilty OT Goal: Miscellaneous Goal #2 - Progress: Goal set today  Visit Information  Last OT Received On: 05/20/12 Assistance Needed: +2    Subjective Data  Subjective: I want to get stronger.  I've been moving my legs and using that thing (incentive spirometer) every hour Patient Stated Goal: get stronger   Prior Functioning     Home Living Lives With: Spouse Bathroom Shower/Tub:  (bed level) Bathroom Toilet:  (bed level:  uses depends) Communication Communication: No difficulties         Vision/Perception  Cognition  Overall Cognitive Status: Appears within functional limits for tasks assessed/performed Arousal/Alertness: Awake/alert Orientation Level: Appears intact for tasks assessed Behavior During Session: [facility] for tasks performed    Extremity/Trunk Assessment Right  Upper Extremity Assessment RUE ROM/Strength/Tone: [facility] for tasks assessed (strength grossly 3+/5) Left Upper Extremity Assessment LUE ROM/Strength/Tone: WFL for tasks assessed (strength grossly 3+/5)     Mobility Bed Mobility Rolling Right: 3: Mod assist Rolling Left: 3: Mod assist     Shoulder Instructions     Exercise Shoulder Exercises Shoulder Flexion: AROM;Other (comment) (to 110 degrees.  Reviewed core exercises)   Balance     End of Session OT - End of Session Activity Tolerance: Patient limited by fatigue;Patient limited by pain Patient left: in chair;with call bell/phone within reach  GO     Anselma Herbel 05/20/2012, 1:04 PM Marica Otter, OTR/L [phone] 05/20/2012

## 2012-05-20 NOTE — Progress Notes (Signed)
MEDICATION RELATED CONSULT NOTE - INITIAL   Pharmacy Consult for IV phosphate replacement Indication: low Phos  Allergies  Allergen Reactions  . Penicillins Swelling    Patient Measurements: Height: 5\' 10"  (177.8 cm) Weight: 215 lb 2.7 oz (97.6 kg) IBW/kg (Calculated) : 73  Adjusted Body Weight:  Vital Signs: Temp: 98.6 F (37 C) (10/12 0526) Temp src: Oral (10/12 0526) BP: 126/77 mmHg (10/12 0526) Pulse Rate: 92  (10/12 0526) Intake/Output from previous day: 10/11 0701 - 10/12 0700 In: 1975.7 [P.O.:360; I.V.:1191.7; IV Piggyback:424] Out: 1360 [Urine:1325; Stool:35] Intake/Output from this shift:    Labs:  Basename 05/20/12 0500 05/19/12 0425 05/18/12 0500  WBC 4.4 3.4* 3.1*  HGB 9.9* 9.5* 9.8*  HCT 28.9* 27.2* 28.9*  PLT 169 155 137*  APTT -- -- --  CREATININE 0.28* 0.21* 0.28*  LABCREA -- -- --  CREATININE 0.28* 0.21* 0.28*  CREAT24HRUR -- -- --  MG 2.0 -- --  PHOS 1.2* -- --  ALBUMIN -- 1.9* --  PROT -- 4.8* --  ALBUMIN -- 1.9* --  AST -- 35 --  ALT -- 56* --  ALKPHOS -- 96 --  BILITOT -- 0.7 --  BILIDIR -- -- --  IBILI -- -- --   Estimated Creatinine Clearance: 99.2 ml/min (by C-G formula based on Cr of 0.28).   Medical History: Past Medical History  Diagnosis Date  . Arthritis   . GERD (gastroesophageal reflux disease)   . Complication of anesthesia     aspiration during surgery, difficult to wake up  . Multiple myeloma   . Status post radiation therapy 03/23/12 - 04/05/12    T8 - L4/ 20 Gy/ 10 Fractions  . Dysphagia 05/06/12    ED Visit    Medications:  Scheduled:    . antiseptic oral rinse  15 mL Mouth Rinse QID  . calcium gluconate  1 g Intravenous Once  . citalopram  20 mg Oral Daily  . dexamethasone  2 mg Oral Daily  . diltiazem  30 mg Oral QID  . feeding supplement  237 mL Oral TID BM  . feeding supplement  1 Container Oral Q24H  . fondaparinux (ARIXTRA) injection  10 mg Subcutaneous Daily  . furosemide  40 mg Intravenous Daily   . insulin aspart  0-15 Units Subcutaneous TID WC  . insulin aspart  0-5 Units Subcutaneous QHS  . insulin glargine  15 Units Subcutaneous QHS  . meropenem (MERREM) IV  1 g Intravenous Q8H  . pantoprazole (PROTONIX) IV  40 mg Intravenous Q12H  . potassium chloride  40 mEq Oral Daily  . DISCONTD: dexamethasone  4 mg Oral Daily  . DISCONTD: feeding supplement  1 Container Oral TID BM    Assessment: 18 YOM with perforated bowel d/t diverticulitis s/p surgery.  Phos = 1.2 this am and Hem/Onc has asked Rx to dose IV replacement  Goal of Therapy:  Phos= 2.5-3.5  Plan:  Based on K+ being WNL, will give NaPhos 20 mmol over 6h. F/u am phos level  Zeigler, Tad Moore 05/20/2012,8:12 AM

## 2012-05-21 LAB — CBC
Hemoglobin: 9.9 g/dL — ABNORMAL LOW (ref 13.0–17.0)
MCH: 31.7 pg (ref 26.0–34.0)
MCV: 93.6 fL (ref 78.0–100.0)
Platelets: 147 10*3/uL — ABNORMAL LOW (ref 150–400)
RBC: 3.12 MIL/uL — ABNORMAL LOW (ref 4.22–5.81)
WBC: 5.2 10*3/uL (ref 4.0–10.5)

## 2012-05-21 LAB — PHOSPHORUS: Phosphorus: 1.7 mg/dL — ABNORMAL LOW (ref 2.3–4.6)

## 2012-05-21 LAB — GLUCOSE, CAPILLARY
Glucose-Capillary: 153 mg/dL — ABNORMAL HIGH (ref 70–99)
Glucose-Capillary: 125 mg/dL — ABNORMAL HIGH (ref 70–99)

## 2012-05-21 LAB — HEMOGLOBIN A1C: Hgb A1c MFr Bld: 7.5 % — ABNORMAL HIGH (ref <5.7)

## 2012-05-21 MED ORDER — DEXTROSE 5 % IV SOLN
20.0000 mmol | Freq: Once | INTRAVENOUS | Status: AC
  Administered 2012-05-21: 20 mmol via INTRAVENOUS
  Filled 2012-05-21: qty 6.67

## 2012-05-21 MED ORDER — INSULIN GLARGINE 100 UNIT/ML ~~LOC~~ SOLN
12.0000 [IU] | Freq: Every day | SUBCUTANEOUS | Status: DC
  Administered 2012-05-21: 12 [IU] via SUBCUTANEOUS

## 2012-05-21 MED ORDER — MORPHINE SULFATE 2 MG/ML IJ SOLN: 1.0000 mg | INTRAMUSCULAR | Status: DC | PRN

## 2012-05-21 MED ORDER — OXYCODONE HCL 5 MG PO TABS: 5.0000 mg | ORAL_TABLET | ORAL | Status: DC | PRN

## 2012-05-21 NOTE — Consult Note (Signed)
WOC ostomy consult  Stoma type/location: LLQ Colostomy Stomal assessment/size: 1 and 3/4 inches round, os in center Planned pouch change for tomorrow am. Ostomy pouching: 2pc.  Will see in am prior to discharge for routine pouch change.  Patient is not likely to become independent (see previous note),  Thanks, Ladona Mow, MSN, RN, [facility], CWOCN [phone])

## 2012-05-21 NOTE — Progress Notes (Signed)
Clinical Social Work Department BRIEF PSYCHOSOCIAL ASSESSMENT 05/21/2012  Patient:  Robert Berger,Robert Berger     Account Number:  [id number]     Admit date:  05/13/2012  Clinical Social Worker:  Leron Croak, CLINICAL SOCIAL WORKER  Date/Time:  05/21/2012 05:54 PM  Referred by:  Physician  Date Referred:  05/19/2012 Referred for  SNF Placement   Other Referral:   Interview type:  Patient Other interview type:    PSYCHOSOCIAL DATA Living Status:  SIGNIFICANT OTHER Admitted from facility:   Level of care:   Primary support name:  Anson Crofts Primary support relationship to patient:  NONE Degree of support available:    CURRENT CONCERNS Current Concerns  Post-Acute Placement   Other Concerns:    SOCIAL WORK ASSESSMENT / PLAN CSW met with the Pt at the bedside for d/c planning. Pt has been in an SNF on one other occassion and would not like to return to that facility. ([facility]) Pt is agreeable to search in the [employer] area.   Assessment/plan status:  Information/Referral to [facility] Other assessment/ plan:   Information/referral to community resources:   CSW provided a listing of SNF's in the [place] area    PATIENT'S/FAMILY'S RESPONSE TO PLAN OF CARE: Pt was appreciative for assistance withd/c planning       Robert Berger Weekend Coverage [phone]

## 2012-05-21 NOTE — Progress Notes (Addendum)
[facility] PROGRESS NOTE  Robert Berger LKG:[id number] DOB: 26-Aug-1940 DOA: 05/13/2012 PCP: [name], MD  Assessment/Plan: 1-Perforated bowel: due to diverticulitis; post surgery recommendations and diet advancing orders to be decided by CCS. No distension, positive BS and just mild pain in the incision area. Antibiotics discontinue per surgery rec's. No signs of infection.  2-Multiple Myeloma: per oncology. Continue decadron (dose adjusted down to 2 mg daily). Will follow any further rec's and appreciate oncology service assistance   3-PE/DVT: after discussing with oncology service, decision was to start patient on arixtra; heparin drip discontinue today. IVC filter to be remove before discharge. No overt bleeding. Close follow up of his Hgb. Stable at 9.9 today  4-Resp distress and hypoxemia requiring ICU and prolonged intubation after surgery: now extubated since 10/9; good O2 sat. Will continue weaning oxygen down to RA as tolerated; will continue IS; heparin discontinued and patient started on arixtra. Doing good and no SOB  5-PE: on arixtra now; will look to remove IVC filter on Monday or so. Will discuss with IR; plan is to remove IVC filter prior to discharge.  6-depression: continue celexa  7-diabetes:continue SSI; now that diet is advance will continue lantus for better control and will adjust as needed. Steroids also playing a role in making more difficult to control his sugar; oncology has reduce his decadron to 2mg  instead of 4mg , This will definitely help with CBG controls. A1C pending. After lantus was added CBG's has been less than 200 and no hypoglycemia.  8-GERD: on PPI  9-HTN: stable; will continue monitoring BP and will continue diltiazem  10-protein calorie malnutrition: continue glucerna and breeze; will continue carb modified diet  11-physical deconditioning: Continue PT/OT; Per PT and OT recommendations patient will require SNF for physical rehab at  discharge. He is in agreement and motivated to get better. Unfortunately unable to tolerate at this moment intense 3 plus hours a day of CIR.  12-Hypophosphatemia: continue repletion per pharmacy; 1.7 today  DVT: arixtra  Code Status: Full Family Communication: no family at bedside Disposition Plan: most likely SNF for physical deconditioning at discharge  Consultants:  CCS  PT/OT  CCM  oncology  Procedures:  Exploratory laparotomy and hartman's pouch for bowel perforation due to diverticulitis  Antibiotics:  meropenem  HPI/Subjective: Afebrile; no CP, no abdominal pain. Flat affect and with generalize weakness. Wants his strength back    Objective: Filed Vitals:   05/20/12 1109 05/20/12 1420 05/20/12 2106 05/21/12 0500  BP:  127/76 127/76 132/82  Pulse:  95 93 90  Temp:  98.2 F (36.8 C) 98.5 F (36.9 C) 98.3 F (36.8 C)  TempSrc:  Oral Oral Oral  Resp:  22 20 20   Height:      Weight:    95.3 kg (210 lb 1.6 oz)  SpO2: 95% 95% 95% 96%    Intake/Output Summary (Last 24 hours) at 05/21/12 1102 Last data filed at 05/21/12 1040  Gross per 24 hour  Intake   2360 ml  Output   5202 ml  Net  -2842 ml   Filed Weights   05/18/12 0548 05/19/12 0515 05/21/12 0500  Weight: 99.7 kg (219 lb 12.8 oz) 97.6 kg (215 lb 2.7 oz) 95.3 kg (210 lb 1.6 oz)    Exam:   General:  NAD; afebrile; good O2 sat on RA  Cardiovascular: no rubs or gallops; S1 and S2  Respiratory: scattered rhonchi; no crackles; good air movement  Abdomen: soft, no distended; clean incision, ostomy with pink color  and no drainage or erythema; positive BS; also with brown stools inside colostomy bag.  Neuro: flat affect, no focal deficit; due to poor effort and recent surgery patient MS is 3/5 bilaterally; no focal deficit.  Data Reviewed: Basic Metabolic Panel:  Lab 05/21/12 1610 05/20/12 0500 05/19/12 0425 05/18/12 0500 05/17/12 0526 05/16/12 0407 05/15/12 0435  NA -- 137 135 136 140 137 --    K -- 4.2 3.5 3.3* 3.3* 3.5 --  CL -- 102 101 102 105 104 --  CO2 -- 28 26 26 25 24  --  GLUCOSE -- 124* 155* 86 118* 138* --  BUN -- 8 5* 6 12 9  --  CREATININE -- 0.28* 0.21* 0.28* 0.35* 0.32* --  CALCIUM -- 7.1* 6.2* 6.5* 6.5* 6.6* --  MG -- 2.0 -- -- 2.3 2.1 2.2  PHOS 1.7* 1.2* -- -- -- -- 2.1*   Liver Function Tests:  Lab 05/19/12 0425  AST 35  ALT 56*  ALKPHOS 96  BILITOT 0.7  PROT 4.8*  ALBUMIN 1.9*   CBC:  Lab 05/21/12 0312 05/20/12 0500 05/19/12 0425 05/18/12 0500 05/17/12 0526  WBC 5.2 4.4 3.4* 3.1* 3.1*  NEUTROABS -- -- -- -- --  HGB 9.9* 9.9* 9.5* 9.8* 9.0*  HCT 29.2* 28.9* 27.2* 28.9* 26.8*  MCV 93.6 94.1 92.5 93.5 95.0  PLT 147* 169 155 137* 131*   CBG:  Lab 05/21/12 0739 05/20/12 2105 05/20/12 1745 05/20/12 1145 05/20/12 0737  GLUCAP 79 146* 155* 142* 121*    Recent Results (from the past 240 hour(s))  CULTURE, BLOOD (ROUTINE X 2)     Status: Normal   Collection Time   05/13/12  9:40 PM      Component Value Range Status Comment   Specimen Description BLOOD LEFT ANTECUBITAL   Final    Special Requests BOTTLES DRAWN AEROBIC AND ANAEROBIC 5CC   Final    Culture  Setup Time 05/14/2012 11:36   Final    Culture NO GROWTH 5 DAYS   Final    Report Status 05/20/2012 FINAL   Final   CULTURE, BLOOD (ROUTINE X 2)     Status: Normal   Collection Time   05/13/12 10:20 PM      Component Value Range Status Comment   Specimen Description BLOOD RIGHT CVL   Final    Special Requests     Final    Value: BOTTLES DRAWN AEROBIC AND ANAEROBIC ANA 1CC AER 5CC   Culture  Setup Time 05/14/2012 11:38   Final    Culture     Final    Value: DIPHTHEROIDS(CORYNEBACTERIUM SPECIES)     Note: Standardized susceptibility testing for this organism is not available.     STAPHYLOCOCCUS SPECIES (COAGULASE NEGATIVE)     Note: THE SIGNIFICANCE OF ISOLATING THIS ORGANISM FROM A SINGLE SET OF BLOOD CULTURES WHEN MULTIPLE SETS ARE DRAWN IS UNCERTAIN. PLEASE NOTIFY THE MICROBIOLOGY DEPARTMENT  WITHIN ONE WEEK IF SPECIATION AND SENSITIVITIES ARE REQUIRED.     Note: Gram Stain Report Called to,Read Back By and Verified With: STACEY AT 1956 107/13 BY SNOLO Gram Stain Report Called to,Read Back By and Verified With: STACEY PHELPS @0533  ON 05/16/2012 BY MCLET   Report Status 05/20/2012 FINAL   Final   MRSA PCR SCREENING     Status: Normal   Collection Time   05/14/12  3:05 AM      Component Value Range Status Comment   MRSA by PCR NEGATIVE  NEGATIVE Final  Studies: No results found.  Scheduled Meds:    . citalopram  20 mg Oral Daily  . dexamethasone  2 mg Oral Daily  . diltiazem  30 mg Oral QID  . feeding supplement  237 mL Oral TID BM  . feeding supplement  1 Container Oral Q24H  . fondaparinux (ARIXTRA) injection  10 mg Subcutaneous Daily  . furosemide  40 mg Intravenous Daily  . insulin aspart  0-15 Units Subcutaneous TID WC  . insulin aspart  0-5 Units Subcutaneous QHS  . insulin glargine  12 Units Subcutaneous QHS  . pantoprazole (PROTONIX) IV  40 mg Intravenous Q12H  . potassium chloride  40 mEq Oral Daily  . sodium phosphate  Dextrose 5% IVPB  20 mmol Intravenous Once  . sodium phosphate  Dextrose 5% IVPB  20 mmol Intravenous Once  . DISCONTD: antiseptic oral rinse  15 mL Mouth Rinse QID  . DISCONTD: insulin glargine  15 Units Subcutaneous QHS  . DISCONTD: meropenem (MERREM) IV  1 g Intravenous Q8H   Continuous Infusions:    . sodium chloride 50 mL/hr at 05/20/12 0151    Time spent: >30 minutes    Robert Berger  [facility] Pager [phone]. If 8PM-8AM, please contact night-coverage at [email or web address], password [facility] 05/21/2012, 11:02 AM  LOS: 8 days

## 2012-05-21 NOTE — Progress Notes (Signed)
7 Days Post-Op  Subjective: Weak.  Wants to get strength back.  Objective: Vital signs in last 24 hours: Temp:  [98.2 F (36.8 C)-98.5 F (36.9 C)] 98.3 F (36.8 C) (10/13 0500) Pulse Rate:  [90-95] 90  (10/13 0500) Resp:  [20-22] 20  (10/13 0500) BP: (127-132)/(76-82) 132/82 mmHg (10/13 0500) SpO2:  [95 %-97 %] 96 % (10/13 0500) Weight:  [210 lb 1.6 oz (95.3 kg)] 210 lb 1.6 oz (95.3 kg) (10/13 0500) Last BM Date: 05/21/12  Intake/Output from previous day: 10/12 0701 - 10/13 0700 In: 2240 [P.O.:1200; I.V.:840; IV Piggyback:200] Out: 4352 [Urine:4350; Stool:2] Intake/Output this shift: Total I/O In: -  Out: 375 [Urine:375]  PE: Abd-soft, incision clean and intact with staples, colostomy pink and viable with brown stool output  Lab Results:   Basename 05/21/12 0312 05/20/12 0500  WBC 5.2 4.4  HGB 9.9* 9.9*  HCT 29.2* 28.9*  PLT 147* 169   BMET  Basename 05/20/12 0500 05/19/12 0425  NA 137 135  K 4.2 3.5  CL 102 101  CO2 28 26  GLUCOSE 124* 155*  BUN 8 5*  CREATININE 0.28* 0.21*  CALCIUM 7.1* 6.2*   PT/INR No results found for this basename: LABPROT:2,INR:2 in the last 72 hours Comprehensive Metabolic Panel:    Component Value Date/Time   NA 137 05/20/2012 0500   NA 137 05/10/2012 1112   K 4.2 05/20/2012 0500   K 5.1* 05/10/2012 1112   CL 102 05/20/2012 0500   CL 96* 05/10/2012 1112   CO2 28 05/20/2012 0500   CO2 29 05/10/2012 1112   BUN 8 05/20/2012 0500   BUN 13 05/10/2012 1112   CREATININE 0.28* 05/20/2012 0500   CREATININE 0.5* 05/10/2012 1112   GLUCOSE 124* 05/20/2012 0500   GLUCOSE 193* 05/10/2012 1112   CALCIUM 7.1* 05/20/2012 0500   CALCIUM 8.5 05/10/2012 1112   AST 35 05/19/2012 0425   AST 43* 05/10/2012 1112   ALT 56* 05/19/2012 0425   ALKPHOS 96 05/19/2012 0425   ALKPHOS 113* 05/10/2012 1112   BILITOT 0.7 05/19/2012 0425   BILITOT 1.10 05/10/2012 1112   PROT 4.8* 05/19/2012 0425   PROT 5.9* 05/10/2012 1112   ALBUMIN 1.9* 05/19/2012 0425      Studies/Results: No results found.  Anti-infectives: Anti-infectives     Start     Dose/Rate Route Frequency Ordered Stop   05/14/12 1200   vancomycin (VANCOCIN) IVPB 1000 mg/200 mL premix  Status:  Discontinued        1,000 mg 200 mL/hr over 60 Minutes Intravenous Every 8 hours 05/14/12 1115 05/15/12 1038   05/13/12 2230   meropenem (MERREM) 1 g in sodium chloride 0.9 % 100 mL IVPB        1 g 200 mL/hr over 30 Minutes Intravenous 3 times per day 05/13/12 2158     05/13/12 2100   levofloxacin (LEVAQUIN) IVPB 750 mg  Status:  Discontinued        750 mg 100 mL/hr over 90 Minutes Intravenous Every 24 hours 05/13/12 2050 05/13/12 2155   05/13/12 2100   metroNIDAZOLE (FLAGYL) IVPB 500 mg  Status:  Discontinued        500 mg 100 mL/hr over 60 Minutes Intravenous Every 8 hours 05/13/12 2050 05/13/12 2155          Assessment . S/p Hartman's for perf diverticulitis-stable  T. Gerkin - 05/13/2012  2. Multiple Myeloma  Seen by Dr. Ross Marcus  3. Bedridden  4. PE (pulmonary thromboembolism)  Doppler 05/13/2012 - bilateral DVT.  IVC filter placed by IR.  Was on IV heparin  On Arixtra now  5. Thrombocytopenia - platelets down a little today   Physical deconditioning      LOS: 8 days   Plan:  Discontinue abxs.   Shaquel Josephson J 05/21/2012

## 2012-05-21 NOTE — Progress Notes (Signed)
Nsg has taught pt how to "burp" ostomy last night and today.  Ostomy was charted to have been changed last Thursday, but pt states he's not had any teaching and states he'd benefit from the ostomy nurse.  Md aware and order placed for ostomy consult

## 2012-05-21 NOTE — Progress Notes (Signed)
Pharmacy: Phosphate Replacement   71 YOM with perforated bowel d/t diverticulitis s/p surgery, Pharmacy asked to replace phosphorus.   Phosphorous today (1.7) has improved from yesterdays level (1.2) after sodium phosphorous 20 mmol x 1 given, however remains below goal and will give a second dose today.    Given normal potassium level of 4.2 will repeat replacement with sodium phosphorus rather than potassium phosphate.   Plan:  1.) Sodium phosphate 20 mmol in 250 mg D5W 2.) Repeat phos level in AM   Lonia Roane, Loma Messing PharmD Pager #: [phone] 10:56 AM 05/21/2012

## 2012-05-22 ENCOUNTER — Encounter (HOSPITAL_COMMUNITY): Payer: Self-pay | Admitting: Radiology

## 2012-05-22 LAB — CBC
HCT: 31.2 % — ABNORMAL LOW (ref 39.0–52.0)
Hemoglobin: 10.7 g/dL — ABNORMAL LOW (ref 13.0–17.0)
MCHC: 34.3 g/dL (ref 30.0–36.0)
RBC: 3.32 MIL/uL — ABNORMAL LOW (ref 4.22–5.81)

## 2012-05-22 LAB — PHOSPHORUS: Phosphorus: 2.3 mg/dL (ref 2.3–4.6)

## 2012-05-22 LAB — GLUCOSE, CAPILLARY: Glucose-Capillary: 176 mg/dL — ABNORMAL HIGH (ref 70–99)

## 2012-05-22 MED ORDER — INSULIN GLARGINE 100 UNIT/ML ~~LOC~~ SOLN
10.0000 [IU] | Freq: Every day | SUBCUTANEOUS | Status: DC
  Administered 2012-05-22: 10 [IU] via SUBCUTANEOUS

## 2012-05-22 MED ORDER — FUROSEMIDE 10 MG/ML IJ SOLN
20.0000 mg | Freq: Every day | INTRAMUSCULAR | Status: DC
  Administered 2012-05-22 – 2012-05-24 (×3): 20 mg via INTRAVENOUS
  Filled 2012-05-22 (×3): qty 2

## 2012-05-22 MED ORDER — MORPHINE SULFATE 2 MG/ML IJ SOLN: 1.0000 mg | INTRAMUSCULAR | Status: DC | PRN

## 2012-05-22 MED ORDER — OXYCODONE HCL 5 MG PO TABS
5.0000 mg | ORAL_TABLET | Freq: Four times a day (QID) | ORAL | Status: DC | PRN
  Administered 2012-05-23: 5 mg via ORAL
  Filled 2012-05-22: qty 1

## 2012-05-22 NOTE — Progress Notes (Signed)
Patient interviewed and examined, agree with PA note above.  Mariella Saa MD, FACS  05/22/2012 2:03 PM

## 2012-05-22 NOTE — Consult Note (Signed)
WOC ostomy consult  Stoma type/location: LLQ colostomy Stomal assessment/size: 1 and 3/4 inches by 1 and 1/8 inch oval with "dip" noted between 8 and 10 o'clock. Os in center Peristomal assessment: intact.  Blister previously noted at  9 o'clock has reepithelialized Treatment options for stomal/peristomal skin: none Output soft semi-formed light brown stool Ostomy pouching: 2pc., 2 and 1/4 inch pouching system with barrier ring. Education provided: patient's significant other in room for pouch change; became emotional.  Patient and significant other upset about odor, degree of difficulty in changing and emptying pouching system.  Booklet again provided for review, Chief Strategy Officer and program reviewed.  Both patient and spouse believe that Rehab facility will be essential to managing ostomy; are wondering if this will be something that they can manage at home with help of HHRN.  Both taught that system will be changed twice weekly, but will need to be emptied between 4-6 times daily.  Different pouch options taught, including those with a gas filter and the closed end style.  Significant other informs this Clinical research associate that it is her bipolar condition that brought on the tears. Thanks, Ladona Mow, MSN, RN, [facility], CWOCN [phone])

## 2012-05-22 NOTE — Progress Notes (Signed)
Physical Therapy Treatment Patient Details Name: Robert Berger MRN: [id number] DOB: 01/20/1941 Today's Date: 05/22/2012 Time: 4098-1191 PT Time Calculation (min): 27 min  PT Assessment / Plan / Recommendation Comments on Treatment Session  Pt continues to be very weak and have very little activity tolerance.  Attempted sitting EOB, however pt unable to tolerate, assisted pt with maximove to chair, however unable to tolerate more than 10 mins.  communicated for nurse tech to try chair position with pt later in day.  Pt too fatigued to try at this time.      Follow Up Recommendations  Post acute inpatient     Does the patient have the potential to tolerate intense rehabilitation  No, Recommend SNF  Barriers to Discharge        Equipment Recommendations  Wheelchair (measurements);Wheelchair cushion (measurements)    Recommendations for Other Services    Frequency Min 3X/week   Plan Discharge plan remains appropriate    Precautions / Restrictions Precautions Precautions: Fall Precaution Comments: pt reports he had gotten too weak to walk from recent chemotherapy. Thats why he was bed bound Restrictions Weight Bearing Restrictions: No   Pertinent Vitals/Pain 8/10 pain lower back at most with sitting EOB    Mobility  Bed Mobility Bed Mobility: Rolling Right;Rolling Left;Supine to Sit;Sit to Supine Rolling Right: 3: Mod assist;With rail Rolling Left: 3: Mod assist;With rail Supine to Sit: 1: +2 Total assist;HOB elevated Supine to Sit: Patient Percentage: 0% Sit to Supine: 1: +2 Total assist;HOB flat Sit to Supine: Patient Percentage: 0% Details for Bed Mobility Assistance: Pt requires assist for LEs/trunk when rolling R and L.  Pt able to use UE to grab onto rail to assist with rolling.  Performed rolling x 2 each way for lift pad placement and removal.  Transfers Transfers: Not assessed Transfer via Lift Equipment: Maximove Details for Transfer Assistance: Pt will most  likely not be able to get oob. Chair position may be most he will tolerate eventually.  Used maximove to assist pt to recliner, however pt only able to tolerate approx 10 mins before needing to get back to bed.   Ambulation/Gait Ambulation/Gait Assistance: Not tested (comment)    Exercises General Exercises - Lower Extremity Ankle Circles/Pumps: AROM;Both;10 reps;Supine Quad Sets: AROM;Both;10 reps;Supine Heel Slides: Both;5 reps;AAROM   PT Diagnosis:    PT Problem List:   PT Treatment Interventions:     PT Goals Acute Rehab PT Goals PT Goal Formulation: With patient/family Time For Goal Achievement: 06/01/12 Potential to Achieve Goals: Fair Pt will Roll Supine to Right Side: with min assist PT Goal: Rolling Supine to Right Side - Progress: Updated due to goal met Pt will Roll Supine to Left Side: with min assist PT Goal: Rolling Supine to Left Side - Progress: Updated due to goal met Pt will go Supine/Side to Sit: with +2 total assist PT Goal: Supine/Side to Sit - Progress: Progressing toward goal Pt will Sit at [facility] of Bed: with +2 total assist;3-5 min PT Goal: Sit at Edge Of Bed - Progress: Progressing toward goal Pt will Perform Home Exercise Program: with min assist PT Goal: Perform Home Exercise Program - Progress: Progressing toward goal  Visit Information  Last PT Received On: 05/22/12 Assistance Needed: +2    Subjective Data  Subjective: My back hurts too bad when I try to sit up.   Patient Stated Goal: to walk again   Cognition  Overall Cognitive Status: Appears within functional limits for tasks assessed/performed Arousal/Alertness: Awake/alert Orientation  Level: Appears intact for tasks assessed Behavior During Session: [facility] for tasks performed    Balance  Balance Balance Assessed: Yes Static Sitting Balance Static Sitting - Balance Support: Bilateral upper extremity supported;Feet supported Static Sitting - Level of Assistance: 1: +1 Total assist Static  Sitting - Comment/# of Minutes: Attempted to sit pt on EOB, however pt only able to sit up approx 1 min without having to lie back down due to pain in lower back.   End of Session PT - End of Session Activity Tolerance: Patient limited by pain;Patient limited by fatigue Patient left: in bed;with family/visitor present Nurse Communication: Mobility status (for nurse tech to put pt in chair position. )   GP     Page, Meribeth Mattes 05/22/2012, 3:09 PM

## 2012-05-22 NOTE — Progress Notes (Signed)
05/21/13-23:30pm--Pt was asleep when I did my 1st rounds tonight, viewed tele monitor,PICC line, & observed no acute changes in assessment than what was reported to me at 23:00pm. Will con't to monitor for changes, & document any via doc flowsheets thru remainder of my shift.

## 2012-05-22 NOTE — Progress Notes (Signed)
Report received J.Zappia,RN. No change in assessment.  Will continue current plan of care. Robert Berger

## 2012-05-22 NOTE — Progress Notes (Signed)
Patient ID: Robert Berger, male   DOB: [birth year], 71 y.o.   MRN: [id number] 8 Days Post-Op  Subjective: Pt feels ok today, not much appetite, but didn't have much appetite before surgery either  Objective: Vital signs in last 24 hours: Temp:  [98.3 F (36.8 C)-99.3 F (37.4 C)] 98.3 F (36.8 C) (10/14 0516) Pulse Rate:  [62-94] 62  (10/14 0516) Resp:  [18-22] 18  (10/14 0516) BP: (113-127)/(80-86) 127/86 mmHg (10/14 0516) SpO2:  [94 %-96 %] 95 % (10/14 0516) Weight:  [204 lb 12.9 oz (92.9 kg)] 204 lb 12.9 oz (92.9 kg) (10/14 0516) Last BM Date: 05/22/12  Intake/Output from previous day: 10/13 0701 - 10/14 0700 In: 1929.7 [P.O.:720; I.V.:953; IV Piggyback:256.7] Out: 3700 [Urine:3575; Stool:125] Intake/Output this shift: Total I/O In: 120 [P.O.:120] Out: 500 [Stool:500]  PE: Abd: soft, incision is c/d/i with staples, stoma is pink and ostomy was just changed  Lab Results:   Basename 05/22/12 0504 05/21/12 0312  WBC 4.9 5.2  HGB 10.7* 9.9*  HCT 31.2* 29.2*  PLT 162 147*   BMET  Basename 05/20/12 0500  NA 137  K 4.2  CL 102  CO2 28  GLUCOSE 124*  BUN 8  CREATININE 0.28*  CALCIUM 7.1*   PT/INR No results found for this basename: LABPROT:2,INR:2 in the last 72 hours CMP     Component Value Date/Time   NA 137 05/20/2012 0500   NA 137 05/10/2012 1112   K 4.2 05/20/2012 0500   K 5.1* 05/10/2012 1112   CL 102 05/20/2012 0500   CL 96* 05/10/2012 1112   CO2 28 05/20/2012 0500   CO2 29 05/10/2012 1112   GLUCOSE 124* 05/20/2012 0500   GLUCOSE 193* 05/10/2012 1112   BUN 8 05/20/2012 0500   BUN 13 05/10/2012 1112   CREATININE 0.28* 05/20/2012 0500   CREATININE 0.5* 05/10/2012 1112   CALCIUM 7.1* 05/20/2012 0500   CALCIUM 8.5 05/10/2012 1112   PROT 4.8* 05/19/2012 0425   PROT 5.9* 05/10/2012 1112   ALBUMIN 1.9* 05/19/2012 0425   AST 35 05/19/2012 0425   AST 43* 05/10/2012 1112   ALT 56* 05/19/2012 0425   ALKPHOS 96 05/19/2012 0425   ALKPHOS 113* 05/10/2012 1112   BILITOT 0.7 05/19/2012 0425   BILITOT 1.10 05/10/2012 1112   GFRNONAA >90 05/20/2012 0500   GFRAA >90 05/20/2012 0500   Lipase     Component Value Date/Time   LIPASE 28 03/09/2012 1100       Studies/Results: No results found.  Anti-infectives: Anti-infectives     Start     Dose/Rate Route Frequency Ordered Stop   05/14/12 1200   vancomycin (VANCOCIN) IVPB 1000 mg/200 mL premix  Status:  Discontinued        1,000 mg 200 mL/hr over 60 Minutes Intravenous Every 8 hours 05/14/12 1115 05/15/12 1038   05/13/12 2230   meropenem (MERREM) 1 g in sodium chloride 0.9 % 100 mL IVPB  Status:  Discontinued        1 g 200 mL/hr over 30 Minutes Intravenous 3 times per day 05/13/12 2158 05/21/12 0907   05/13/12 2100   levofloxacin (LEVAQUIN) IVPB 750 mg  Status:  Discontinued        750 mg 100 mL/hr over 90 Minutes Intravenous Every 24 hours 05/13/12 2050 05/13/12 2155   05/13/12 2100   metroNIDAZOLE (FLAGYL) IVPB 500 mg  Status:  Discontinued        500 mg 100 mL/hr over 60 Minutes Intravenous Every 8  hours 05/13/12 2050 05/13/12 2155           Assessment/Plan  1. S/p Hartman's for perf tics 2. MM  Plan: 1. Patient is stable for dc from our standpoint when he has a bed at a sNF available. 2. Will dc staples prior to dc, which ever day that is. 3. Follow up with Dr. Gerrit Friends in 2-3 weeks after dc. 4. Cont Glucerna for supplementation while not eating as much.   LOS: 9 days    Jenaye Rickert E 05/22/2012, 10:46 AM Pager: [phone]

## 2012-05-22 NOTE — H&P (Signed)
Chief Complaint: IVC filter retrieval Referring Physician:[place] HPI: Robert Berger is an 71 y.o. male who is familiar to [place] for both recent coablation procedure of MM of Tspine and recent IVC filter placement for DVT/PE He is now on Arixtra and his IVC filter can be removed.  Past Medical History:  Past Medical History  Diagnosis Date  . Arthritis   . GERD (gastroesophageal reflux disease)   . Complication of anesthesia     aspiration during surgery, difficult to wake up  . Multiple myeloma   . Status post radiation therapy 03/23/12 - 04/05/12    T8 - L4/ 20 Gy/ 10 Fractions  . Dysphagia 05/06/12    ED Visit    Past Surgical History:  Past Surgical History  Procedure Date  . Fracture surgery     ankle  . Hemorrhoid surgery   . Back surgery     47 years ago  . Laparotomy 05/14/2012    Procedure: EXPLORATORY LAPAROTOMY;  Surgeon: Velora Heckler, MD;  Location: [facility];  Service: General;  Laterality: N/A;  . Colostomy 05/14/2012    Procedure: COLOSTOMY;  Surgeon: Velora Heckler, MD;  Location: [facility];  Service: General;  Laterality: N/A;    Family History:  Family History  Problem Relation Age of Onset  . Cancer Maternal Grandfather   . Cancer Maternal Grandmother     Social History:  reports that he quit smoking about 34 years ago. His smoking use included Cigarettes. He has never used smokeless tobacco. He reports that he drinks alcohol. He reports that he does not use illicit drugs.  Allergies:  Allergies  Allergen Reactions  . Penicillins Swelling    Medications: Medications Prior to Admission  Medication Sig Dispense Refill  . amLODipine (NORVASC) 5 MG tablet Take 5 mg by mouth every morning.      . baclofen (LIORESAL) 10 MG tablet Take 10 mg by mouth 3 (three) times daily.      . bisacodyl (BISAC-EVAC) 5 MG EC tablet Take 5-10 mg by mouth at bedtime.      Marland Kitchen dexamethasone (DECADRON) 4 MG tablet Take 4 mg by mouth daily with breakfast.       . famciclovir (FAMVIR)  500 MG tablet Take 500 mg by mouth every morning.       . fluconazole (DIFLUCAN) 200 MG tablet Take 200 mg by mouth every morning.      . methocarbamol (ROBAXIN) 500 MG tablet Take 500 mg by mouth 4 (four) times daily as needed. For pain.      Marland Kitchen ondansetron (ZOFRAN) 4 MG tablet Take 4 mg by mouth every 8 (eight) hours as needed. Nausea      . oxyCODONE (OXY IR/ROXICODONE) 5 MG immediate release tablet Take 5 mg by mouth every 4 (four) hours as needed. Breakthrough pain      . oxyCODONE (OXYCONTIN) 20 MG 12 hr tablet Take 1 tablet (20 mg total) by mouth every 12 (twelve) hours.  60 tablet  0  . polyethylene glycol (MIRALAX / GLYCOLAX) packet Take 17 g by mouth daily as needed. For constipation.      . sulfamethoxazole-trimethoprim (BACTRIM DS) 800-160 MG per tablet Take 1 tablet by mouth 2 (two) times daily.      Marland Kitchen zolpidem (AMBIEN) 5 MG tablet Take 5 mg by mouth at bedtime as needed. sleep        Please HPI for pertinent positives, otherwise complete 10 system ROS negative.  Physical Exam: Blood pressure 127/86, pulse  62, temperature 98.3 F (36.8 C), temperature source Oral, resp. rate 18, height 5\' 10"  (1.778 m), weight 204 lb 12.9 oz (92.9 kg), SpO2 95.00%. Body mass index is 29.39 kg/(m^2).   General Appearance:  Alert, cooperative, no distress, appears stated age  Head:  Normocephalic, without obvious abnormality, atraumatic  ENT: Unremarkable  Neck: Supple, symmetrical, trachea midline, no adenopathy, thyroid: not enlarged, symmetric, no tenderness/mass/nodules  Lungs:   Clear to auscultation bilaterally, no w/r/r, respirations unlabored without use of accessory muscles.  Heart:  Regular rate and rhythm, S1, S2 normal, no murmur, rub or gallop. Carotids 2+ without bruit.  Extremities: Extremities normal, atraumatic, no cyanosis or edema  Pulses: 2+ and symmetric  Skin: Skin color, texture, turgor normal, no rashes or lesions  Neurologic: Normal affect, no gross deficits.   Results  for orders placed during the hospital encounter of 05/13/12 (from the past 48 hour(s))  GLUCOSE, CAPILLARY     Status: Abnormal   Collection Time   05/20/12 11:45 AM      Component Value Range Comment   Glucose-Capillary 142 (*) 70 - 99 mg/dL    Comment 1 Documented in Chart      Comment 2 Notify RN     GLUCOSE, CAPILLARY     Status: Abnormal   Collection Time   05/20/12  5:45 PM      Component Value Range Comment   Glucose-Capillary 155 (*) 70 - 99 mg/dL    Comment 1 Documented in Chart      Comment 2 Notify RN     GLUCOSE, CAPILLARY     Status: Abnormal   Collection Time   05/20/12  9:05 PM      Component Value Range Comment   Glucose-Capillary 146 (*) 70 - 99 mg/dL   CBC     Status: Abnormal   Collection Time   05/21/12  3:12 AM      Component Value Range Comment   WBC 5.2  4.0 - 10.5 K/uL    RBC 3.12 (*) 4.22 - 5.81 MIL/uL    Hemoglobin 9.9 (*) 13.0 - 17.0 g/dL    HCT 16.1 (*) 09.6 - 52.0 %    MCV 93.6  78.0 - 100.0 fL    MCH 31.7  26.0 - 34.0 pg    MCHC 33.9  30.0 - 36.0 g/dL    RDW 04.5 (*) 40.9 - 15.5 %    Platelets 147 (*) 150 - 400 K/uL   PHOSPHORUS     Status: Abnormal   Collection Time   05/21/12  3:12 AM      Component Value Range Comment   Phosphorus 1.7 (*) 2.3 - 4.6 mg/dL   GLUCOSE, CAPILLARY     Status: Normal   Collection Time   05/21/12  7:39 AM      Component Value Range Comment   Glucose-Capillary 79  70 - 99 mg/dL    Comment 1 Documented in Chart      Comment 2 Notify RN     GLUCOSE, CAPILLARY     Status: Abnormal   Collection Time   05/21/12 11:45 AM      Component Value Range Comment   Glucose-Capillary 112 (*) 70 - 99 mg/dL    Comment 1 Documented in Chart      Comment 2 Notify RN     GLUCOSE, CAPILLARY     Status: Abnormal   Collection Time   05/21/12  3:40 PM      Component Value Range  Comment   Glucose-Capillary 153 (*) 70 - 99 mg/dL   GLUCOSE, CAPILLARY     Status: Abnormal   Collection Time   05/21/12 10:17 PM      Component  Value Range Comment   Glucose-Capillary 125 (*) 70 - 99 mg/dL   CBC     Status: Abnormal   Collection Time   05/22/12  5:04 AM      Component Value Range Comment   WBC 4.9  4.0 - 10.5 K/uL    RBC 3.32 (*) 4.22 - 5.81 MIL/uL    Hemoglobin 10.7 (*) 13.0 - 17.0 g/dL    HCT 45.4 (*) 09.8 - 52.0 %    MCV 94.0  78.0 - 100.0 fL    MCH 32.2  26.0 - 34.0 pg    MCHC 34.3  30.0 - 36.0 g/dL    RDW 11.9 (*) 14.7 - 15.5 %    Platelets 162  150 - 400 K/uL   PHOSPHORUS     Status: Normal   Collection Time   05/22/12  5:04 AM      Component Value Range Comment   Phosphorus 2.3  2.3 - 4.6 mg/dL   GLUCOSE, CAPILLARY     Status: Normal   Collection Time   05/22/12  7:18 AM      Component Value Range Comment   Glucose-Capillary 92  70 - 99 mg/dL    No results found.  Assessment/Plan Recent DVT/PE S/p IVC filter placement 10/6 Discussed procedure of retrieval including risks and complications. Labs ok Consent signed in chart  Brayton El PA-C 05/22/2012, 11:24 AM

## 2012-05-22 NOTE — Progress Notes (Signed)
Pharmacy Consult: Phosphorus Replacement  Pharmacy consulted to replace phosphorus (Phos = 1.2) on 10/12.   Sodium phos 20 mmol given 10/12 and an additional 10 mmol on 10/13.  Phos level on 10/13 lower end of goal at 2.3.  Pharmacy will sign off. Defer future phos replacement to MD.   Thanks  [name], PharmD, BCPS Pager: [phone] 12:44 PM Pharmacy #: [id number]

## 2012-05-22 NOTE — Progress Notes (Signed)
CSW met with patient and provided bed offers to him and his wife. Will follow for choice.  Robert Berger [name] MSW, LCSW [phone]

## 2012-05-22 NOTE — Plan of Care (Signed)
Problem: Phase I Progression Outcomes Goal: OOB as tolerated unless otherwise ordered Outcome: Not Met (add Reason) Pt was Bedriddin PTA to hospital, Using a lift to get pt up to chair daily, & total care given with turning pt q 2-3hrs as pt will allow staff to do, & floating heels & positioning with pillows when turned.  Problem: Phase II Progression Outcomes Goal: Progress activity as tolerated unless otherwise ordered Outcome: Completed/Met Date Met:  05/22/12 Using a lift to get pt oOOB to chair daily & turning q 2-3 hrs as pt will allow, positioning with pillows & floating heels.

## 2012-05-22 NOTE — Progress Notes (Signed)
[facility] PROGRESS NOTE  Robert Berger ZOX:[id number] DOB: March 06, 1941 DOA: 05/13/2012 PCP: Robert Macho, MD  Assessment/Plan: 1-Perforated bowel: due to diverticulitis; post surgery recommendations and diet advancing orders to be decided by CCS. No distension, positive BS and just mild pain in the incision area. Antibiotics discontinue per surgery rec's. No signs of infection.  2-Multiple Myeloma: per oncology. Continue decadron (dose adjusted down to 2 mg daily). Will follow any further rec's and appreciate oncology service assistance   3-PE/DVT: after discussing with oncology service, decision was to start patient on arixtra; heparin drip discontinue today. IVC filter to be remove before discharge. No overt bleeding. Close follow up of his Hgb. Stable and is 10.7 today  4-Resp distress and hypoxemia requiring ICU and prolonged intubation after surgery: now extubated since 10/9; good O2 sat. Will continue weaning oxygen down to RA as tolerated; will continue IS; heparin discontinued and patient started on arixtra. Doing good and no complaints of SOB  5-PE: on arixtra now; will look to remove IVC filter on Tuesday per IR.  6-depression: continue celexa  7-diabetes:continue SSI; now that diet is advance will continue lantus for better control and will adjust as needed. Steroids also playing a role in making more difficult to control his sugar; oncology has reduce his decadron to 2mg  instead of 4mg , This will definitely help with CBG controls. A1C 7.5. Diet also changed to modify carb; lantus adjusted now that patient is following low carb diet and is receiving less steroids. No hypoglycemia appreciated.  8-GERD: continue PPI  9-HTN: stable; will continue monitoring BP and continue diltiazem  10-protein calorie malnutrition: continue glucerna and breeze; will continue carb modified diet.  11-physical deconditioning: Continue PT/OT; Per PT and OT recommendations patient will  require SNF for physical rehab at discharge. He is in agreement and motivated to get better. Unfortunately unable to tolerate at this moment intense 3 plus hours a day of CIR. SW informed and helping with placement process   12-Hypophosphatemia: repleted 2.3 today  DVT: arixtra  Code Status: Full Family Communication: no family at bedside Disposition Plan: most likely SNF for physical deconditioning at discharge  Consultants:  CCS  PT/OT  CCM  oncology  Procedures:  Exploratory laparotomy and hartman's pouch for bowel perforation due to diverticulitis  Antibiotics:  Meropenem 05/21/12  HPI/Subjective: Afebrile; no CP, no abdominal pain. Flat affect and with generalize weakness. Wants his strength back    Objective: Filed Vitals:   05/21/12 0500 05/21/12 1429 05/21/12 2115 05/22/12 0516  BP: 132/82 123/80 113/82 127/86  Pulse: 90 94 88 62  Temp: 98.3 F (36.8 C) 98.5 F (36.9 C) 99.3 F (37.4 C) 98.3 F (36.8 C)  TempSrc: Oral Oral Oral Oral  Resp: 20 22 20 18   Height:      Weight: 95.3 kg (210 lb 1.6 oz)   92.9 kg (204 lb 12.9 oz)  SpO2: 96% 96% 94% 95%    Intake/Output Summary (Last 24 hours) at 05/22/12 1035 Last data filed at 05/22/12 0900  Gross per 24 hour  Intake 1689.67 ml  Output   3000 ml  Net -1310.33 ml   Filed Weights   05/19/12 0515 05/21/12 0500 05/22/12 0516  Weight: 97.6 kg (215 lb 2.7 oz) 95.3 kg (210 lb 1.6 oz) 92.9 kg (204 lb 12.9 oz)    Exam:   General:  NAD; afebrile; good O2 sat on RA  Cardiovascular: no rubs or gallops; S1 and S2  Respiratory: scattered rhonchi; no crackles; good air movement  Abdomen: soft, no distended; clean incision, ostomy with pink color and no drainage or erythema; positive BS; also with brown stools inside colostomy bag.  Neuro: AAOX3; flat affect, no focal deficit; due to poor effort and recent surgery patient MS is 3/5 bilaterally; no focal deficit.  Data Reviewed: Basic Metabolic  Panel:  Lab 05/22/12 0504 05/21/12 0312 05/20/12 0500 05/19/12 0425 05/18/12 0500 05/17/12 0526 05/16/12 0407  NA -- -- 137 135 136 140 137  K -- -- 4.2 3.5 3.3* 3.3* 3.5  CL -- -- 102 101 102 105 104  CO2 -- -- 28 26 26 25 24   GLUCOSE -- -- 124* 155* 86 118* 138*  BUN -- -- 8 5* 6 12 9   CREATININE -- -- 0.28* 0.21* 0.28* 0.35* 0.32*  CALCIUM -- -- 7.1* 6.2* 6.5* 6.5* 6.6*  MG -- -- 2.0 -- -- 2.3 2.1  PHOS 2.3 1.7* 1.2* -- -- -- --   Liver Function Tests:  Lab 05/19/12 0425  AST 35  ALT 56*  ALKPHOS 96  BILITOT 0.7  PROT 4.8*  ALBUMIN 1.9*   CBC:  Lab 05/22/12 0504 05/21/12 0312 05/20/12 0500 05/19/12 0425 05/18/12 0500  WBC 4.9 5.2 4.4 3.4* 3.1*  NEUTROABS -- -- -- -- --  HGB 10.7* 9.9* 9.9* 9.5* 9.8*  HCT 31.2* 29.2* 28.9* 27.2* 28.9*  MCV 94.0 93.6 94.1 92.5 93.5  PLT 162 147* 169 155 137*   CBG:  Lab 05/22/12 0718 05/21/12 2217 05/21/12 1540 05/21/12 1145 05/21/12 0739  GLUCAP 92 125* 153* 112* 79    Recent Results (from the past 240 hour(s))  CULTURE, BLOOD (ROUTINE X 2)     Status: Normal   Collection Time   05/13/12  9:40 PM      Component Value Range Status Comment   Specimen Description BLOOD LEFT ANTECUBITAL   Final    Special Requests BOTTLES DRAWN AEROBIC AND ANAEROBIC 5CC   Final    Culture  Setup Time 05/14/2012 11:36   Final    Culture NO GROWTH 5 DAYS   Final    Report Status 05/20/2012 FINAL   Final   CULTURE, BLOOD (ROUTINE X 2)     Status: Normal   Collection Time   05/13/12 10:20 PM      Component Value Range Status Comment   Specimen Description BLOOD RIGHT CVL   Final    Special Requests     Final    Value: BOTTLES DRAWN AEROBIC AND ANAEROBIC ANA 1CC AER 5CC   Culture  Setup Time 05/14/2012 11:38   Final    Culture     Final    Value: DIPHTHEROIDS(CORYNEBACTERIUM SPECIES)     Note: Standardized susceptibility testing for this organism is not available.     STAPHYLOCOCCUS SPECIES (COAGULASE NEGATIVE)     Note: THE SIGNIFICANCE OF  ISOLATING THIS ORGANISM FROM A SINGLE SET OF BLOOD CULTURES WHEN MULTIPLE SETS ARE DRAWN IS UNCERTAIN. PLEASE NOTIFY THE MICROBIOLOGY DEPARTMENT WITHIN ONE WEEK IF SPECIATION AND SENSITIVITIES ARE REQUIRED.     Note: Gram Stain Report Called to,Read Back By and Verified With: STACEY AT 1956 107/13 BY SNOLO Gram Stain Report Called to,Read Back By and Verified With: STACEY PHELPS @0533  ON 05/16/2012 BY MCLET   Report Status 05/20/2012 FINAL   Final   MRSA PCR SCREENING     Status: Normal   Collection Time   05/14/12  3:05 AM      Component Value Range Status Comment   MRSA by  PCR NEGATIVE  NEGATIVE Final      Studies: No results found.  Scheduled Meds:    . citalopram  20 mg Oral Daily  . dexamethasone  2 mg Oral Daily  . diltiazem  30 mg Oral QID  . feeding supplement  237 mL Oral TID BM  . feeding supplement  1 Container Oral Q24H  . fondaparinux (ARIXTRA) injection  10 mg Subcutaneous Daily  . furosemide  20 mg Intravenous Daily  . insulin aspart  0-15 Units Subcutaneous TID WC  . insulin aspart  0-5 Units Subcutaneous QHS  . insulin glargine  10 Units Subcutaneous QHS  . pantoprazole (PROTONIX) IV  40 mg Intravenous Q12H  . potassium chloride  40 mEq Oral Daily  . sodium phosphate  Dextrose 5% IVPB  20 mmol Intravenous Once  . DISCONTD: furosemide  40 mg Intravenous Daily  . DISCONTD: insulin glargine  12 Units Subcutaneous QHS  . DISCONTD: insulin glargine  15 Units Subcutaneous QHS   Continuous Infusions:    . sodium chloride 20 mL/hr (05/21/12 1826)    Time spent: >30 minutes    Robert Berger  [facility] Pager [phone]. If 8PM-8AM, please contact night-coverage at [email or web address], password [facility] 05/22/2012, 10:35 AM  LOS: 9 days

## 2012-05-23 ENCOUNTER — Inpatient Hospital Stay (HOSPITAL_COMMUNITY): Payer: BC Managed Care – PPO

## 2012-05-23 DIAGNOSIS — M545 Low back pain, unspecified: Secondary | ICD-10-CM

## 2012-05-23 LAB — GLUCOSE, CAPILLARY
Glucose-Capillary: 134 mg/dL — ABNORMAL HIGH (ref 70–99)
Glucose-Capillary: 149 mg/dL — ABNORMAL HIGH (ref 70–99)

## 2012-05-23 LAB — BASIC METABOLIC PANEL
BUN: 12 mg/dL (ref 6–23)
Calcium: 8.4 mg/dL (ref 8.4–10.5)
Creatinine, Ser: 0.36 mg/dL — ABNORMAL LOW (ref 0.50–1.35)
GFR calc Af Amer: >90 mL/min (ref >90)

## 2012-05-23 LAB — CBC
MCH: 32.1 pg (ref 26.0–34.0)
MCHC: 33.9 g/dL (ref 30.0–36.0)
MCV: 94.7 fL (ref 78.0–100.0)
Platelets: 163 10*3/uL (ref 150–400)
RDW: 18.1 % — ABNORMAL HIGH (ref 11.5–15.5)

## 2012-05-23 MED ORDER — INSULIN GLARGINE 100 UNIT/ML ~~LOC~~ SOLN
5.0000 [IU] | Freq: Every day | SUBCUTANEOUS | Status: DC
  Administered 2012-05-23: 5 [IU] via SUBCUTANEOUS

## 2012-05-23 MED ORDER — FONDAPARINUX SODIUM 7.5 MG/0.6ML ~~LOC~~ SOLN
7.5000 mg | Freq: Every day | SUBCUTANEOUS | Status: DC
  Administered 2012-05-24: 7.5 mg via SUBCUTANEOUS
  Filled 2012-05-23 (×2): qty 0.6

## 2012-05-23 NOTE — Progress Notes (Addendum)
[facility] PROGRESS NOTE  Robert Berger ZOX:[id number] DOB: 05-04-1941 DOA: 05/13/2012 PCP: [name], MD  Assessment/Plan: 1-Perforated bowel: due to diverticulitis; post surgery recommendations and diet advancing orders to be decided by CCS. No distension, positive BS and no abdominal pain. Per surgery stand point, patient is ready for discharge. They will remove his staples prior to discharge. No signs of infection.  2-Multiple Myeloma: per oncology. No further decadron. Patient complaining of low back pain. Per oncology will perform MRI.  3-PE/DVT: after discussing with oncology service, decision was to start patient on arixtra. IVC filter to be remove before discharge. No overt bleeding. Close follow up of his Hgb. Stable and is 10.7 today  4-Resp distress and hypoxemia requiring ICU and prolonged intubation after surgery: now extubated since 10/9; good O2 sat on RA. Doing good and no complaints of SOB. Will continue arixtra  5-PE: on arixtra now; plan to remove IVC filter today by IR.  6-depression: continue celexa  7-diabetes:continue SSI; now that his steroids has been discontinue will reduce lantus and could consider PO hypoglycemic agents at discharge (combination of metformin and glipizide). CBG's in the 140's-150's now.  8-GERD: continue PPI  9-HTN: stable; will continue monitoring BP and continue diltiazem and lasix  10-protein calorie malnutrition: continue glucerna and breeze; will also continue carb modified diet and encourage him to eat.  11-physical deconditioning: Continue PT/OT; Per PT and OT recommendations patient will require SNF for physical rehab at discharge. He is in agreement and motivated to get better. Unfortunately unable to tolerate at this moment intense 3 plus hours a day of CIR. SW informed and helping with placement process   12-Hypophosphatemia: repleted  DVT: arixtra  Code Status: Full Family Communication: no family at  bedside Disposition Plan: most likely SNF for physical deconditioning at discharge  Consultants:  CCS  PT/OT  CCM  oncology  Procedures:  Exploratory laparotomy and hartman's pouch for bowel perforation due to diverticulitis  Antibiotics:  Meropenem 05/13/12- 05/21/12  HPI/Subjective: Afebrile; no CP, no abdominal pain. Complaining of lower back pain; still with generalize weakness.    Objective: Filed Vitals:   05/22/12 0516 05/22/12 1405 05/22/12 2154 05/23/12 0457  BP: 127/86 125/79 125/79 126/80  Pulse: 62 102 92 94  Temp: 98.3 F (36.8 C) 98.3 F (36.8 C) 98.3 F (36.8 C) 98.4 F (36.9 C)  TempSrc: Oral Oral Oral Oral  Resp: 18 20 20 20   Height:      Weight: 92.9 kg (204 lb 12.9 oz)   90.6 kg (199 lb 11.8 oz)  SpO2: 95% 97% 96% 97%    Intake/Output Summary (Last 24 hours) at 05/23/12 1555 Last data filed at 05/23/12 1143  Gross per 24 hour  Intake   1200 ml  Output   1075 ml  Net    125 ml   Filed Weights   05/21/12 0500 05/22/12 0516 05/23/12 0457  Weight: 95.3 kg (210 lb 1.6 oz) 92.9 kg (204 lb 12.9 oz) 90.6 kg (199 lb 11.8 oz)    Exam:   General:  NAD; afebrile; good O2 sat on RA  Cardiovascular: no rubs or gallops; S1 and S2  Respiratory: scattered rhonchi; no crackles; good air movement  Abdomen: soft, no distended; clean incision, ostomy with pink color and no drainage or erythema; positive BS; also with brown stools inside colostomy bag.  Neuro: AAOX3; flat affect, no focal deficit; due to poor effort and recent surgery patient MS is 3/5 bilaterally; no focal deficit.  Data Reviewed:  Basic Metabolic Panel:  Lab 05/23/12 1517 05/22/12 0504 05/21/12 0312 05/20/12 0500 05/19/12 0425 05/18/12 0500 05/17/12 0526  NA 134* -- -- 137 135 136 140  K 3.9 -- -- 4.2 3.5 3.3* 3.3*  CL 98 -- -- 102 101 102 105  CO2 28 -- -- 28 26 26 25   GLUCOSE 120* -- -- 124* 155* 86 118*  BUN 12 -- -- 8 5* 6 12  CREATININE 0.36* -- -- 0.28* 0.21* 0.28* 0.35*   CALCIUM 8.4 -- -- 7.1* 6.2* 6.5* 6.5*  MG -- -- -- 2.0 -- -- 2.3  PHOS -- 2.3 1.7* 1.2* -- -- --   Liver Function Tests:  Lab 05/19/12 0425  AST 35  ALT 56*  ALKPHOS 96  BILITOT 0.7  PROT 4.8*  ALBUMIN 1.9*   CBC:  Lab 05/23/12 0450 05/22/12 0504 05/21/12 0312 05/20/12 0500 05/19/12 0425  WBC 5.1 4.9 5.2 4.4 3.4*  NEUTROABS -- -- -- -- --  HGB 10.9* 10.7* 9.9* 9.9* 9.5*  HCT 32.2* 31.2* 29.2* 28.9* 27.2*  MCV 94.7 94.0 93.6 94.1 92.5  PLT 163 162 147* 169 155   CBG:  Lab 05/23/12 1415 05/23/12 1147 05/23/12 0733 05/22/12 2139 05/22/12 1646  GLUCAP 149* 134* 118* 143* 176*    Recent Results (from the past 240 hour(s))  CULTURE, BLOOD (ROUTINE X 2)     Status: Normal   Collection Time   05/13/12  9:40 PM      Component Value Range Status Comment   Specimen Description BLOOD LEFT ANTECUBITAL   Final    Special Requests BOTTLES DRAWN AEROBIC AND ANAEROBIC 5CC   Final    Culture  Setup Time 05/14/2012 11:36   Final    Culture NO GROWTH 5 DAYS   Final    Report Status 05/20/2012 FINAL   Final   CULTURE, BLOOD (ROUTINE X 2)     Status: Normal   Collection Time   05/13/12 10:20 PM      Component Value Range Status Comment   Specimen Description BLOOD RIGHT CVL   Final    Special Requests     Final    Value: BOTTLES DRAWN AEROBIC AND ANAEROBIC ANA 1CC AER 5CC   Culture  Setup Time 05/14/2012 11:38   Final    Culture     Final    Value: DIPHTHEROIDS(CORYNEBACTERIUM SPECIES)     Note: Standardized susceptibility testing for this organism is not available.     STAPHYLOCOCCUS SPECIES (COAGULASE NEGATIVE)     Note: THE SIGNIFICANCE OF ISOLATING THIS ORGANISM FROM A SINGLE SET OF BLOOD CULTURES WHEN MULTIPLE SETS ARE DRAWN IS UNCERTAIN. PLEASE NOTIFY THE MICROBIOLOGY DEPARTMENT WITHIN ONE WEEK IF SPECIATION AND SENSITIVITIES ARE REQUIRED.     Note: Gram Stain Report Called to,Read Back By and Verified With: STACEY AT 1956 107/13 BY SNOLO Gram Stain Report Called to,Read Back By  and Verified With: STACEY PHELPS @0533  ON 05/16/2012 BY MCLET   Report Status 05/20/2012 FINAL   Final   MRSA PCR SCREENING     Status: Normal   Collection Time   05/14/12  3:05 AM      Component Value Range Status Comment   MRSA by PCR NEGATIVE  NEGATIVE Final      Studies: No results found.  Scheduled Meds:    . citalopram  20 mg Oral Daily  . diltiazem  30 mg Oral QID  . feeding supplement  237 mL Oral TID BM  . feeding supplement  1 Container Oral Q24H  . fondaparinux (ARIXTRA) injection  7.5 mg Subcutaneous Daily  . furosemide  20 mg Intravenous Daily  . insulin aspart  0-15 Units Subcutaneous TID WC  . insulin aspart  0-5 Units Subcutaneous QHS  . insulin glargine  5 Units Subcutaneous QHS  . pantoprazole (PROTONIX) IV  40 mg Intravenous Q12H  . potassium chloride  40 mEq Oral Daily  . DISCONTD: dexamethasone  2 mg Oral Daily  . DISCONTD: fondaparinux (ARIXTRA) injection  10 mg Subcutaneous Daily  . DISCONTD: insulin glargine  10 Units Subcutaneous QHS   Continuous Infusions:    . sodium chloride 20 mL/hr (05/21/12 1826)    Time spent: >30 minutes    Meagon Duskin  [facility] Pager [phone]. If 8PM-8AM, please contact night-coverage at [email or web address], password [facility] 05/23/2012, 3:55 PM  LOS: 10 days

## 2012-05-23 NOTE — Progress Notes (Signed)
Patient ID: Robert Berger, male   DOB: [birth year], 71 y.o.   MRN: [id number] 9 Days Post-Op  Subjective: Pt feels better today.  Trying to eat as much as he can.  No significant pain.  Objective: Vital signs in last 24 hours: Temp:  [98.3 F (36.8 C)-98.4 F (36.9 C)] 98.4 F (36.9 C) (10/15 0457) Pulse Rate:  [92-102] 94  (10/15 0457) Resp:  [20] 20  (10/15 0457) BP: (125-126)/(79-80) 126/80 mmHg (10/15 0457) SpO2:  [96 %-97 %] 97 % (10/15 0457) Weight:  [199 lb 11.8 oz (90.6 kg)] 199 lb 11.8 oz (90.6 kg) (10/15 0457) Last BM Date: 05/22/12  Intake/Output from previous day: 10/14 0701 - 10/15 0700 In: 1080 [P.O.:600; I.V.:480] Out: 2625 [Urine:1775; Stool:850] Intake/Output this shift:    PE: Abd: soft, minimally tender, incision c/d/i with staples, ostomy with air.  Stoma is pink.  Lab Results:   Basename 05/23/12 0450 05/22/12 0504  WBC 5.1 4.9  HGB 10.9* 10.7*  HCT 32.2* 31.2*  PLT 163 162   BMET  Basename 05/23/12 0450  NA 134*  K 3.9  CL 98  CO2 28  GLUCOSE 120*  BUN 12  CREATININE 0.36*  CALCIUM 8.4   PT/INR No results found for this basename: LABPROT:2,INR:2 in the last 72 hours CMP     Component Value Date/Time   NA 134* 05/23/2012 0450   NA 137 05/10/2012 1112   K 3.9 05/23/2012 0450   K 5.1* 05/10/2012 1112   CL 98 05/23/2012 0450   CL 96* 05/10/2012 1112   CO2 28 05/23/2012 0450   CO2 29 05/10/2012 1112   GLUCOSE 120* 05/23/2012 0450   GLUCOSE 193* 05/10/2012 1112   BUN 12 05/23/2012 0450   BUN 13 05/10/2012 1112   CREATININE 0.36* 05/23/2012 0450   CREATININE 0.5* 05/10/2012 1112   CALCIUM 8.4 05/23/2012 0450   CALCIUM 8.5 05/10/2012 1112   PROT 4.8* 05/19/2012 0425   PROT 5.9* 05/10/2012 1112   ALBUMIN 1.9* 05/19/2012 0425   AST 35 05/19/2012 0425   AST 43* 05/10/2012 1112   ALT 56* 05/19/2012 0425   ALKPHOS 96 05/19/2012 0425   ALKPHOS 113* 05/10/2012 1112   BILITOT 0.7 05/19/2012 0425   BILITOT 1.10 05/10/2012 1112   GFRNONAA >90  05/23/2012 0450   GFRAA >90 05/23/2012 0450   Lipase     Component Value Date/Time   LIPASE 28 03/09/2012 1100       Studies/Results: No results found.  Anti-infectives: Anti-infectives     Start     Dose/Rate Route Frequency Ordered Stop   05/14/12 1200   vancomycin (VANCOCIN) IVPB 1000 mg/200 mL premix  Status:  Discontinued        1,000 mg 200 mL/hr over 60 Minutes Intravenous Every 8 hours 05/14/12 1115 05/15/12 1038   05/13/12 2230   meropenem (MERREM) 1 g in sodium chloride 0.9 % 100 mL IVPB  Status:  Discontinued        1 g 200 mL/hr over 30 Minutes Intravenous 3 times per day 05/13/12 2158 05/21/12 0907   05/13/12 2100   levofloxacin (LEVAQUIN) IVPB 750 mg  Status:  Discontinued        750 mg 100 mL/hr over 90 Minutes Intravenous Every 24 hours 05/13/12 2050 05/13/12 2155   05/13/12 2100   metroNIDAZOLE (FLAGYL) IVPB 500 mg  Status:  Discontinued        500 mg 100 mL/hr over 60 Minutes Intravenous Every 8 hours 05/13/12 2050 05/13/12 2155  Assessment/Plan  1. S/p Hartman's for perf tics 2. MM 3. DM 4. PE  Plan: 1. Patient is surgically stable for dc whenever medically stable.  Will dc staples prior to dc to SNF whichever day that may be.  (suspect tomorrow as patient set up for IVC filter retrieval today)   LOS: 10 days    China Deitrick E 05/23/2012, 8:14 AM Pager: [phone]

## 2012-05-23 NOTE — Progress Notes (Signed)
He is c/o more pain in the lower back.  He has has XRT and kyphoplasty .  I ithink that an MRI would be helpful.    He continues on Arixtra for his DVT/PE.  No c/o dyspnea.  Labs look ok.  Appeitite is marginal.  Hopefully this will improve.  Awaiting for SNF.  I really hope that he will get a lot of PT/OT there.  Only on Decadron 2 mg a day.  Will d/c this now.  His VSS.  No temp. Colostomy is working well.  I will order the MRI today for LS spine.  Hewitt Shorts

## 2012-05-23 NOTE — Progress Notes (Signed)
Patient interviewed and examined, agree with PA note above.  Mariella Saa MD, FACS  05/23/2012 12:53 PM

## 2012-05-23 NOTE — Procedures (Signed)
IVC filter retrieval No comp 

## 2012-05-24 ENCOUNTER — Ambulatory Visit: Payer: BC Managed Care – PPO | Admitting: Hematology & Oncology

## 2012-05-24 ENCOUNTER — Inpatient Hospital Stay (HOSPITAL_COMMUNITY): Payer: BC Managed Care – PPO

## 2012-05-24 ENCOUNTER — Other Ambulatory Visit: Payer: Medicare Other | Admitting: Lab

## 2012-05-24 ENCOUNTER — Ambulatory Visit: Payer: Medicare Other

## 2012-05-24 LAB — CBC
MCHC: 33.4 g/dL (ref 30.0–36.0)
MCV: 95.4 fL (ref 78.0–100.0)
Platelets: 142 10*3/uL — ABNORMAL LOW (ref 150–400)
RDW: 18 % — ABNORMAL HIGH (ref 11.5–15.5)
WBC: 3.8 10*3/uL — ABNORMAL LOW (ref 4.0–10.5)

## 2012-05-24 LAB — GLUCOSE, CAPILLARY: Glucose-Capillary: 171 mg/dL — ABNORMAL HIGH (ref 70–99)

## 2012-05-24 MED ORDER — INSULIN GLARGINE 100 UNIT/ML ~~LOC~~ SOLN: 5.0000 [IU] | Freq: Every day | SUBCUTANEOUS | Status: DC

## 2012-05-24 MED ORDER — OXYCODONE HCL 5 MG PO TABS: 5.0000 mg | ORAL_TABLET | ORAL | Status: DC | PRN

## 2012-05-24 MED ORDER — PANTOPRAZOLE SODIUM 40 MG PO TBEC: 40.0000 mg | DELAYED_RELEASE_TABLET | Freq: Every day | ORAL | Status: DC

## 2012-05-24 MED ORDER — POTASSIUM CHLORIDE 20 MEQ/15ML (10%) PO LIQD: 20.0000 meq | Freq: Every day | ORAL | Status: DC

## 2012-05-24 MED ORDER — DILTIAZEM HCL 30 MG PO TABS: 30.0000 mg | ORAL_TABLET | Freq: Four times a day (QID) | ORAL | Status: DC

## 2012-05-24 MED ORDER — CITALOPRAM HYDROBROMIDE 20 MG PO TABS: 20.0000 mg | ORAL_TABLET | Freq: Every day | ORAL | Status: DC

## 2012-05-24 MED ORDER — ZOLPIDEM TARTRATE 5 MG PO TABS: 5.0000 mg | ORAL_TABLET | Freq: Every evening | ORAL | Status: DC | PRN

## 2012-05-24 MED ORDER — OXYCODONE HCL 20 MG PO TB12: 20.0000 mg | ORAL_TABLET | Freq: Two times a day (BID) | ORAL | Status: DC

## 2012-05-24 MED ORDER — FUROSEMIDE 20 MG PO TABS: 20.0000 mg | ORAL_TABLET | Freq: Every day | ORAL | Status: DC

## 2012-05-24 MED ORDER — FONDAPARINUX SODIUM 7.5 MG/0.6ML ~~LOC~~ SOLN: 7.5000 mg | Freq: Every day | SUBCUTANEOUS | Status: DC

## 2012-05-24 MED ORDER — GADOBENATE DIMEGLUMINE 529 MG/ML IV SOLN
18.0000 mL | Freq: Once | INTRAVENOUS | Status: AC | PRN
  Administered 2012-05-24: 18 mL via INTRAVENOUS

## 2012-05-24 NOTE — Discharge Summary (Signed)
Physician Discharge Summary  [name] ZOX:[id number] DOB: [birth year] DOA: 05/13/2012  PCP: [name], MD  Admit date: 05/13/2012 Discharge date: 05/24/2012  Recommendations for Outpatient Follow-up:  1. Pt will need to follow up with PCP in 2-3 weeks post discharge 2. Pt will also need to see surgery specialist and his oncologist as noted below under the follow up section  3. Please obtain BMP to evaluate electrolytes and kidney function 4. Please also check CBC to evaluate Hg and Hct levels 5. Please note that Cardizem was added to pt's medication list due to persistent tachycardia, the dose and frequency may be changed based on the level of control 6. Also, low dose Lasix 20 mg PO QD was added along with potassium supplement, 2 D ECHO findings consistent with decreased ventricular function but difficult to interpret due to limited study 7. In addition, pt was started on Fondaparinux for treatment of DVT and PE 8. Please note that pt will be discharge to SNF for further rehabilitation as recommended by physical therapist  9. Please note A1c was checked during this hospital stay, was 7.5 and patient was started on low-dose Lantus 5 units each bedtime, this dose can be readjusted if indicated  Discharge Diagnoses: Perforated bowel secondary to diverticulitis Principal Problem:  *Bowel perforation Active Problems:  Diabetes mellitus  PE (pulmonary thromboembolism)  Acute respiratory failure  Altered mental status  Hypoxemia  HTN (hypertension)  Physical deconditioning  Depression  Discharge Condition: Stable  Diet recommendation: Heart healthy diet discussed in details   History of present illness:  Robert Berger is a very pleasant 71 y/o man with a past medical history significant for multiple myeloma with multiple bone lytic lesions currently being treated with velcade and zometa. He presented to the [name] on the evening of 10/5 with shortness of breath. He has a  pulse oximeter at home and noted that his saturations were in the mid-80s. In the ED he had a chest x-ray which did not show any pulmonary infiltrate but did show free air in the abdomen. A CTA was done and showed bilateral PEs and confirmed a large amount of free air in the abdomen thought to be due to perforation in the left lower abdomen. Robert Berger surprisingly denied any significant abdominal pain. His last bowel movement was about 4 days prior to admission and reported that he generally has very painful hard bowel movements. He has not had any nausea or vomiting. He has been basically bed bound for the last 3 weeks prior to admission. Prior to that he was walking with a walker. He has not had any fevers or chills, cough, and no significant pain.   Hospital Course:  Principal Problem:  *Bowel perforation - This was determined to be secondary to diverticulitis - Patient is now status post exploratory laparotomy, laparoscopic sigmoid colectomy, colostomy placement 05/13/2012 - Patient has had complicated hospital course with development of peritonitis and sepsis and has required broad-spectrum antibiotics listed below - His hospital course has also been complicated by acute ventilator dependent respiratory failure requiring intubation - Ostomy tube looks stable this morning - Per oncologist recommendation Decadron was discontinued  Active Problems:  Diabetes mellitus - Complicated by requirement of Decadron use - A1c is 7.5 and was checked during this hospital stay - Please note that patient was started on low-dose Lantus 5 units each bedtime   PE (pulmonary thromboembolism) - Patient started on fondaparinux - Please note that IVC filter was removed prior to discharge -  Hemoglobin and hematocrit have remained stable and at patient's baseline   Multiple myeloma - Oncologist Dr. Twanna Hy following   Acute respiratory failure, hypoxic - Requiring intubation, patient extubated and has done  well since - He is maintaining oxygen saturations above 95% on room air - Incentive spirometry encouraged and patient compliance with recommendations   Altered mental status - Multifactorial, secondary to perforated bowel in the setting of acute diverticulitis, sepsis, progressive failure to thrive and deconditioning - Now resolved - Physical therapy to continue in skilled nursing facility   Depression - Please note that Celexa was started during this hospital stay  Procedures/Studies:  Dg Chest 2 View (if Patient Has Fever And/or Copd) 05/13/2012  IMPRESSION:   1.  Large amount of free intraperitoneal air.  2.  Superior mediastinal widening, raising question of adenopathy. Further evaluation with CT of the chest, abdomen, and pelvis with contrast is suggested for further evaluation.    Dg Elbow Complete Left 04/25/2012  IMPRESSION:  No acute or lytic osseous abnormality identified about the left elbow.    Ct Angio Chest Pe W/cm &/or Wo Cm 05/13/2012    IMPRESSION:   1.  Large amount of free peritoneal air, most likely due to perforation in the left lower abdomen, either small bowel or colon in that region, with a small amount of associated free peritoneal fluid.  2.  Mild partial small bowel obstruction.  3.  Small bilateral inguinal hernias containing fat and free peritoneal air.  4.  Changes of multiple myeloma involving the left iliac bone.   Ir Ivc Filter Plmt / S&i /img Guid/mod Sed 05/14/2012    IMPRESSION:   Placement of percutaneous IVC filter in infrarenal IVC.  IVC venogram shows no evidence of IVC thrombus and normal caliber of the inferior vena cava.  This filter does have both permanent and retrievable indications.     Dg Chest Port 1 View 05/17/2012  IMPRESSION:  1.  Kyphotic view.  Lines and tubes appear stable. 2.  Continued very low lung volumes.     Dg Chest Port 1 View 05/16/2012  IMPRESSION:  1.  Rotated and more kyphotic view.  This might explain the  apparently higher position of the endotracheal tube. Clinical correlation recommended.  2. Otherwise, stable lines and tubes.  3. Continued very low lung volumes.     Dg Chest Port 1 View 05/15/2012     IMPRESSION:  1.  Endotracheal tube tip now just above the level of clavicles. Otherwise, stable lines and tubes.  2.  Very low lung volumes with perihilar and basilar opacity which most resembles atelectasis.      Dg Chest Port 1 View 05/14/2012    IMPRESSION:  Satisfactory positioning of endotracheal tube with tip approximately 2 cm above the carina.  The central line tip remains in the right atrium.    Dg Chest Port 1 View 05/13/2012   IMPRESSION:  1.  Central line to mid right atrium without pneumothorax.  2.  Persistent free intraperitoneal gas.    Ir Ivc Filter Retrieval / S&i /img Guid/mod Sed 05/23/2012  IMPRESSION:  Successful IVC filter retrieval.    Consultations:  Surgery  Interventional Radiology  Oncologist Dr. Twanna Hy  PCCM admitted the patient to ICU  Antibiotics:  Levaquin 10/05>>10/05   Meropenem 10/05>> 10/13  Flagyl 10/05>>10/05   Vancomycin 10/05>>10/07  Discharge Exam: Filed Vitals:   05/24/12 0441  BP: 112/71  Pulse: 89  Temp: 97.8 F (36.6 C)  Resp: 18  Filed Vitals:   05/23/12 1620 05/23/12 2142 05/23/12 2324 05/24/12 0441  BP:  109/72 110/73 112/71  Pulse: 103 101 93 89  Temp: 98.1 F (36.7 C) 98.2 F (36.8 C)  97.8 F (36.6 C)  TempSrc: Oral Oral  Oral  Resp:  18  18  Height:      Weight:    90.946 kg (200 lb 8 oz)  SpO2:  97%  97%    General: Pt is alert, follows commands appropriately, not in acute distress Cardiovascular: Regular rate and rhythm, S1/S2 +, no murmurs, no rubs, no gallops Respiratory: Clear to auscultation bilaterally, no wheezing, no crackles, no rhonchi Abdominal: Soft, non tender, non distended, bowel sounds +, no guarding Extremities: no edema, no cyanosis, pulses palpable bilaterally DP and  PT Neuro: Grossly nonfocal  Discharge Instructions  Discharge Orders    Future Appointments: Provider: Department: Dept Phone: Center:   06/07/2012 11:45 AM Rachael Fee [facility] [phone] None   06/07/2012 12:15 PM Eunice Blase, PA [facility] [phone] None   06/07/2012 1:15 PM [facility] [phone] None   06/14/2012 9:30 AM Rachael Fee [facility] [phone] None   06/14/2012 10:00 AM [facility] [phone] None   06/21/2012 11:45 AM Rachael Fee [facility] [phone] None   06/21/2012 12:15 PM [facility] [phone] None     Future Orders Please Complete By Expires   Diet - low sodium heart healthy      Increase activity slowly          Medication List     As of 05/24/2012 11:09 AM    STOP taking these medications         dexamethasone 4 MG tablet   Commonly known as: DECADRON      famciclovir 500 MG tablet   Commonly known as: FAMVIR      fluconazole 200 MG tablet   Commonly known as: DIFLUCAN      sulfamethoxazole-trimethoprim 800-160 MG per tablet   Commonly known as: BACTRIM DS      TAKE these medications         amLODipine 5 MG tablet   Commonly known as: NORVASC   Take 5 mg by mouth every morning.      baclofen 10 MG tablet   Commonly known as: LIORESAL   Take 10 mg by mouth 3 (three) times daily.      BISAC-EVAC 5 MG EC tablet   Generic drug: bisacodyl   Take 5-10 mg by mouth at bedtime.      citalopram 20 MG tablet   Commonly known as: CELEXA   Take 1 tablet (20 mg total) by mouth daily.      diltiazem 30 MG tablet   Commonly known as: CARDIZEM   Take 1 tablet (30 mg total) by mouth 4 (four) times daily.      fondaparinux 7.5 MG/0.6ML Soln   Commonly known as: ARIXTRA   Inject 0.6 mLs (7.5 mg total) into the skin daily.      furosemide 20 MG tablet   Commonly known as: LASIX   Take 1 tablet (20 mg total) by mouth daily.       insulin glargine 100 UNIT/ML injection   Commonly known as: LANTUS   Inject 5 Units into the skin at bedtime.      methocarbamol 500 MG tablet   Commonly known as: ROBAXIN   Take 500 mg by mouth 4 (four)  times daily as needed. For pain.      ondansetron 4 MG tablet   Commonly known as: ZOFRAN   Take 4 mg by mouth every 8 (eight) hours as needed. Nausea      oxyCODONE 5 MG immediate release tablet   Commonly known as: Oxy IR/ROXICODONE   Take 1 tablet (5 mg total) by mouth every 4 (four) hours as needed. Breakthrough pain      oxyCODONE 20 MG 12 hr tablet   Commonly known as: OXYCONTIN   Take 1 tablet (20 mg total) by mouth every 12 (twelve) hours.      pantoprazole 40 MG tablet   Commonly known as: PROTONIX   Take 1 tablet (40 mg total) by mouth daily.      polyethylene glycol packet   Commonly known as: MIRALAX / GLYCOLAX   Take 17 g by mouth daily as needed. For constipation.      potassium chloride 20 MEQ/15ML (10%) solution   Take 15 mLs (20 mEq total) by mouth daily.      zolpidem 5 MG tablet   Commonly known as: AMBIEN   Take 1 tablet (5 mg total) by mouth at bedtime as needed. sleep           Follow-up Information    Follow up with Velora Heckler, MD. Schedule an appointment as soon as possible for a visit in 3 weeks.   Contact information:   [address] [place] [address] [phone]       Follow up with [name], MD. In 2 weeks.   Contact information:   [address] [place] [address] [phone]           The results of significant diagnostics from this hospitalization (including imaging, microbiology, ancillary and laboratory) are listed below for reference.     Microbiology: No results found for this or any previous visit (from the past 240 hour(s)).   Labs: Basic Metabolic Panel:  Lab 05/23/12 5784 05/22/12 0504 05/21/12 0312 05/20/12 0500 05/19/12 0425 05/18/12 0500  NA 134* -- -- 137 135 136   K 3.9 -- -- 4.2 3.5 3.3*  CL 98 -- -- 102 101 102  CO2 28 -- -- 28 26 26   GLUCOSE 120* -- -- 124* 155* 86  BUN 12 -- -- 8 5* 6  CREATININE 0.36* -- -- 0.28* 0.21* 0.28*  CALCIUM 8.4 -- -- 7.1* 6.2* 6.5*  MG -- -- -- 2.0 -- --  PHOS -- 2.3 1.7* 1.2* -- --   Liver Function Tests:  Lab 05/19/12 0425  AST 35  ALT 56*  ALKPHOS 96  BILITOT 0.7  PROT 4.8*  ALBUMIN 1.9*   CBC:  Lab 05/24/12 0435 05/23/12 0450 05/22/12 0504 05/21/12 0312 05/20/12 0500  WBC 3.8* 5.1 4.9 5.2 4.4  NEUTROABS -- -- -- -- --  HGB 10.5* 10.9* 10.7* 9.9* 9.9*  HCT 31.4* 32.2* 31.2* 29.2* 28.9*  MCV 95.4 94.7 94.0 93.6 94.1  PLT 142* 163 162 147* 169   CBG:  Lab 05/24/12 0813 05/23/12 2139 05/23/12 1806 05/23/12 1415 05/23/12 1147  GLUCAP 120* 142* 176* 149* 134*   SIGNED: Time coordinating discharge: Over 30 minutes  Debbora Presto, MD  [facility] 05/24/2012, 11:09 AM Pager [phone]  If 7PM-7AM, please contact night-coverage [email or web address] Password [email or web address]

## 2012-05-24 NOTE — Progress Notes (Signed)
Assumed care of patient from previous RN at approx midnight.  Checked patient's telemetry, PICC line, VS, and observed no acute changes in assessment from what was reported to me at midnight.  Will continue to monitor for changes and document any via doc flowsheets for the remainder of my shift.

## 2012-05-24 NOTE — Progress Notes (Signed)
Patient ID: Robert Berger, male   DOB: 08-Sep-1940, 71 y.o.   MRN: [id number] 10 Days Post-Op  Subjective: Pt feels ok today.  Eating a little better.  Unable to have MRI yesterday secondary to staples  Objective: Vital signs in last 24 hours: Temp:  [97.8 F (36.6 C)-98.2 F (36.8 C)] 97.8 F (36.6 C) (10/16 0441) Pulse Rate:  [89-103] 89  (10/16 0441) Resp:  [18-19] 18  (10/16 0441) BP: (109-113)/(71-80) 112/71 mmHg (10/16 0441) SpO2:  [97 %-98 %] 97 % (10/16 0441) Weight:  [200 lb 8 oz (90.946 kg)] 200 lb 8 oz (90.946 kg) (10/16 0441) Last BM Date: 05/23/12  Intake/Output from previous day: 10/15 0701 - 10/16 0700 In: 1476.3 [P.O.:927; I.V.:549.3] Out: 1590 [Urine:1540; Stool:50] Intake/Output this shift:    PE: Abd: soft, stable, staples removed and steri-strips applied.  Ostomy with good function  Lab Results:   Basename 05/24/12 0435 05/23/12 0450  WBC 3.8* 5.1  HGB 10.5* 10.9*  HCT 31.4* 32.2*  PLT 142* 163   BMET  Basename 05/23/12 0450  NA 134*  K 3.9  CL 98  CO2 28  GLUCOSE 120*  BUN 12  CREATININE 0.36*  CALCIUM 8.4   PT/INR No results found for this basename: LABPROT:2,INR:2 in the last 72 hours CMP     Component Value Date/Time   NA 134* 05/23/2012 0450   NA 137 05/10/2012 1112   K 3.9 05/23/2012 0450   K 5.1* 05/10/2012 1112   CL 98 05/23/2012 0450   CL 96* 05/10/2012 1112   CO2 28 05/23/2012 0450   CO2 29 05/10/2012 1112   GLUCOSE 120* 05/23/2012 0450   GLUCOSE 193* 05/10/2012 1112   BUN 12 05/23/2012 0450   BUN 13 05/10/2012 1112   CREATININE 0.36* 05/23/2012 0450   CREATININE 0.5* 05/10/2012 1112   CALCIUM 8.4 05/23/2012 0450   CALCIUM 8.5 05/10/2012 1112   PROT 4.8* 05/19/2012 0425   PROT 5.9* 05/10/2012 1112   ALBUMIN 1.9* 05/19/2012 0425   AST 35 05/19/2012 0425   AST 43* 05/10/2012 1112   ALT 56* 05/19/2012 0425   ALKPHOS 96 05/19/2012 0425   ALKPHOS 113* 05/10/2012 1112   BILITOT 0.7 05/19/2012 0425   BILITOT 1.10 05/10/2012 1112     GFRNONAA >90 05/23/2012 0450   GFRAA >90 05/23/2012 0450   Lipase     Component Value Date/Time   LIPASE 28 03/09/2012 1100       Studies/Results: Ir Ivc Filter Retrieval / S&i /img Guid/mod Sed  05/23/2012  *RADIOLOGY REPORT*  Clinical Data/Indication: PULMONARY THROMBOEMBOLISM.   THE THE PATIENT IS NOW ON ARIXTRA ANTICOAGULATION.  IR IVC FILTER RETRIEVAL/S+I/ IMAGE GUIDE MODERATE SEDATION  Contrast Volume: 45 ml Omnipaque-300.  Fluoroscopy Time: 4.4 minutes.  Procedure: The procedure, risks, benefits, and alternatives were explained to the patient. Questions regarding the procedure were encouraged and answered. The patient understands and consents to the procedure.  The right neck was prepped with betadine in a sterile fashion, and a sterile drape was applied covering the operative field. A sterile gown and sterile gloves were used for the procedure.  1% lidocaine was utilized local anesthesia.  Under sonographic guidance, a micropuncture needle was inserted into the right internal jugular vein and removed over a 0018 wire which was upsized to a [employer].  5-French pigtail was advanced over the [place] to the IVC below the filter.  Venography was performed.  The 11-French sheath was then advanced over the [place] into the filter.  The  6-French snare was then utilized to snare the focal the filter.  The inner 8-French sheath was then advanced over the filter collapsing it and retracting it into the 11-French sheath. The 8-French sheath was removed.  Repeat venography through the 11- French sheath was performed.  It was then removed and hemostasis was achieved with direct pressure.  Findings: Initial venography demonstrates no evidence of thrombus and trapped in the filter.  Stable position of the filter.  Post retrieval venogram demonstrates no evidence of injury or thrombosis within the IVC.  Complications: None.  IMPRESSION: Successful IVC filter retrieval.   Original Report Authenticated By: Donavan Burnet, M.D.     Anti-infectives: Anti-infectives     Start     Dose/Rate Route Frequency Ordered Stop   05/14/12 1200   vancomycin (VANCOCIN) IVPB 1000 mg/200 mL premix  Status:  Discontinued        1,000 mg 200 mL/hr over 60 Minutes Intravenous Every 8 hours 05/14/12 1115 05/15/12 1038   05/13/12 2230   meropenem (MERREM) 1 g in sodium chloride 0.9 % 100 mL IVPB  Status:  Discontinued        1 g 200 mL/hr over 30 Minutes Intravenous 3 times per day 05/13/12 2158 05/21/12 0907   05/13/12 2100   levofloxacin (LEVAQUIN) IVPB 750 mg  Status:  Discontinued        750 mg 100 mL/hr over 90 Minutes Intravenous Every 24 hours 05/13/12 2050 05/13/12 2155   05/13/12 2100   metroNIDAZOLE (FLAGYL) IVPB 500 mg  Status:  Discontinued        500 mg 100 mL/hr over 60 Minutes Intravenous Every 8 hours 05/13/12 2050 05/13/12 2155           Assessment/Plan  1. S/p Hartman's procedure for perf tics 2. MM  Plan: 1. Patient is surgically stable.  All staples removed today.  Patient is awaiting SNF placement after MRI is able to be performed.   2. Follow up in formation in epic for the patient. 3. Will sign off for now, please call back if any issues while awaiting placement.   LOS: 11 days    Konya Fauble E 05/24/2012, 9:22 AM Pager: [phone]

## 2012-05-24 NOTE — Progress Notes (Signed)
CSW met with patient and spouse at bedside. They are requesting [facility]. Patient cleared for discharge today.  Robert Berger Robert Berger MSW, LCSW [phone]

## 2012-05-29 ENCOUNTER — Ambulatory Visit: Payer: Medicare Other | Admitting: Internal Medicine

## 2012-06-07 ENCOUNTER — Ambulatory Visit (HOSPITAL_BASED_OUTPATIENT_CLINIC_OR_DEPARTMENT_OTHER): Payer: BC Managed Care – PPO | Admitting: Medical

## 2012-06-07 ENCOUNTER — Ambulatory Visit (HOSPITAL_BASED_OUTPATIENT_CLINIC_OR_DEPARTMENT_OTHER): Payer: BC Managed Care – PPO

## 2012-06-07 ENCOUNTER — Other Ambulatory Visit (HOSPITAL_BASED_OUTPATIENT_CLINIC_OR_DEPARTMENT_OTHER): Payer: BC Managed Care – PPO | Admitting: Lab

## 2012-06-07 ENCOUNTER — Telehealth: Payer: Self-pay | Admitting: Hematology & Oncology

## 2012-06-07 VITALS — BP 103/60 | HR 81 | Temp 98.2°F | Resp 18 | Ht 70.0 in

## 2012-06-07 DIAGNOSIS — I82409 Acute embolism and thrombosis of unspecified deep veins of unspecified lower extremity: Secondary | ICD-10-CM

## 2012-06-07 DIAGNOSIS — C9000 Multiple myeloma not having achieved remission: Secondary | ICD-10-CM

## 2012-06-07 DIAGNOSIS — G8929 Other chronic pain: Secondary | ICD-10-CM

## 2012-06-07 DIAGNOSIS — M549 Dorsalgia, unspecified: Secondary | ICD-10-CM

## 2012-06-07 LAB — CBC WITH DIFFERENTIAL (CANCER CENTER ONLY)
BASO%: 0.5 % (ref 0.0–2.0)
LYMPH%: 13.3 % — ABNORMAL LOW (ref 14.0–48.0)
MCH: 32.9 pg (ref 28.0–33.4)
MCV: 101 fL — ABNORMAL HIGH (ref 82–98)
MONO#: 0.2 10*3/uL (ref 0.1–0.9)
MONO%: 6.4 % (ref 0.0–13.0)
NEUT#: 2.3 10*3/uL (ref 1.5–6.5)
Platelets: 122 10*3/uL — ABNORMAL LOW (ref 145–400)
RDW: 17.7 % — ABNORMAL HIGH (ref 11.1–15.7)
WBC: 3.8 10*3/uL — ABNORMAL LOW (ref 4.0–10.0)

## 2012-06-07 MED ORDER — SODIUM CHLORIDE 0.9 % IV SOLN
Freq: Once | INTRAVENOUS | Status: AC
  Administered 2012-06-07: 12:00:00 via INTRAVENOUS

## 2012-06-07 MED ORDER — ZOLEDRONIC ACID 4 MG/5ML IV CONC
4.0000 mg | Freq: Once | INTRAVENOUS | Status: AC
  Administered 2012-06-07: 4 mg via INTRAVENOUS
  Filled 2012-06-07: qty 5

## 2012-06-07 NOTE — Progress Notes (Signed)
Diagnoses: #1 IgG kappa myeloma. #2 perforated diverticulum. #3 pulmonary embolism/deep venous thrombosis.  Past therapy. #1 .Patient is status post 3 cycles of Velcade (he had one dose in the hospital).  Velcade has been on hold secondary to poor tolerance, and weakness. #2 patient is on Zometa 4 mg IV monthly. #3 patient status post IR bone tumor, radiofrequency ablation at T9, T10, T11, and T12, with kyphoplasty augmentation.  On 03/21/2012. #4.The patient is status post radiotherapy of T8 through the majority of L4 on 04/05/2012.  Interim history: Robert Berger presents today for an office followup visit.  Unfortunately, Robert Berger was recently admitted to the hospital for perforated bowel secondary to diverticulitis.  The patient did have an exploratory laparotomy, laparoscopic sigmoid colectomy, and clots that may, placement on 05/13/2012.  Unfortunately, he did have a complicated hospital course with development of peritonitis and sepsis and required broad-spectrum antibiotics.  Robert Berger was also placed on Arixtra for treatment of DVT and PE.  Robert Berger did improve over the course of his hospitalization and was eventually discharged to [facility] for further rehabilitation with physical and occupational therapy.  Of note, the Decadron that we have Robert Berger on was discontinued secondary to his diabetes.  In terms of Robert Berger multiple myeloma, he had been on Velcade, which we decided to hold, do to poor tolerance, and weakness.  He still continues on Zometa 4 mg IV on a monthly basis.  He will go ahead and receive Zometa today.  Overall, he did have a nice response with Velcade.  His monoclonal studies improved markedly.  His monoclonal spike, went from 1.95 g/dL.  In August, down to 0.18 g/dL in late May in early October.  His IgG level went from 3000 mg/dL, down to 409 mg/dL. Marland Kitchen  At this time, we are treating Robert Berger supportively until he is able to build his strength back up again.  Today.   He, reports, that he is doing relatively well.  He continues on a six-day a week.  Regimen with physical and occupational therapy appointment, also.  He, reports, his appetite is decent.  He's not having any nausea, vomiting, diarrhea, or constipation.  He denies any type of chest pain, or shortness of breath, or cough.  He denies any fevers, chills, or night sweats.  He denies any obvious, or abnormal bleeding.  He, reports, that he still continues to have a crop, chronic back pain issues, but is able to sit up in a chair for longer period of time.  It would be nice if we could get him more mobile.  Hopefully, this aggressive physical and occupational therapy will help with that.  We will still continue to hold his Velcade.  He is due for Zometa today.  We will continue treating him supportively for the meantime.  Review of Systems: chronic back pain Pt. Denies any changes in their vision, hearing, adenopathy, fevers, chills, nausea, vomiting, diarrhea, constipation, chest pain, shortness of breath, passing blood, passing out, blacking out,  any changes in skin, joints, neurologic or psychiatric except as noted.  Physical Exam: This is a pleasant, somewhat disheveled appearing 71 year old, gentleman, in no obvious distress Vitals: Temperature 98.2 degrees.  Pulse 81, respiration 18, blood pressure 103/60 HEENT reveals a normocephalic, atraumatic skull, no scleral icterus, no oral lesions  Neck is supple without any cervical or supraclavicular adenopathy.  Lungs are clear to auscultation bilaterally. There are no wheezes, rales or rhonci Cardiac is regular rate and rhythm with a  normal S1 and S2. There are no murmurs, rubs, or bruits.  Abdomen is soft with good bowel sounds, there is no palpable mass. There is no palpable hepatosplenomegaly. There is no palpable fluid wave.  Musculoskeletal no tenderness of the spine, ribs, or hips.  Extremities there are no clubbing, cyanosis, 1+ pitting edema of  L/E Skin no petechia, purpura or ecchymosis Neurologic is nonfocal.  Laboratory Data: White count 3.8, hemoglobin 10.1, hematocrit 30.9, platelets 122,000  Current Outpatient Prescriptions on File Prior to Visit  Medication Sig Dispense Refill  . amLODipine (NORVASC) 5 MG tablet Take 5 mg by mouth every morning.      . baclofen (LIORESAL) 10 MG tablet Take 10 mg by mouth 3 (three) times daily.      . bisacodyl (BISAC-EVAC) 5 MG EC tablet Take 5-10 mg by mouth at bedtime.      . citalopram (CELEXA) 20 MG tablet Take 1 tablet (20 mg total) by mouth daily.  30 tablet  1  . diltiazem (CARDIZEM) 30 MG tablet Take 1 tablet (30 mg total) by mouth 4 (four) times daily.  120 tablet  1  . fondaparinux (ARIXTRA) 7.5 MG/0.6ML SOLN Inject 0.6 mLs (7.5 mg total) into the skin daily.  3 mL  1  . furosemide (LASIX) 20 MG tablet Take 1 tablet (20 mg total) by mouth daily.  30 tablet  1  . insulin glargine (LANTUS) 100 UNIT/ML injection Inject 5 Units into the skin at bedtime.  10 mL  1  . methocarbamol (ROBAXIN) 500 MG tablet Take 500 mg by mouth 4 (four) times daily as needed. For pain.      Marland Kitchen ondansetron (ZOFRAN) 4 MG tablet Take 4 mg by mouth every 8 (eight) hours as needed. Nausea      . oxyCODONE (OXY IR/ROXICODONE) 5 MG immediate release tablet Take 1 tablet (5 mg total) by mouth every 4 (four) hours as needed. Breakthrough pain  65 tablet  0  . oxyCODONE (OXYCONTIN) 20 MG 12 hr tablet Take 1 tablet (20 mg total) by mouth every 12 (twelve) hours.  60 tablet  0  . pantoprazole (PROTONIX) 40 MG tablet Take 1 tablet (40 mg total) by mouth daily.  30 tablet  0  . polyethylene glycol (MIRALAX / GLYCOLAX) packet Take 17 g by mouth daily as needed. For constipation.      . potassium chloride 20 MEQ/15ML (10%) solution Take 15 mLs (20 mEq total) by mouth daily.  500 mL  1  . zolpidem (AMBIEN) 5 MG tablet Take 1 tablet (5 mg total) by mouth at bedtime as needed. sleep  30 tablet  0   RADIOLOGY 05/24/2012 MRI  Lumbar spine with and without contrast Impression:   1. Intervals, augmentation at T10, T11, and T12 with persistent  edematous changes at T10 and T12.  2. Stable posterior enhancing lesion at L3 without collapse.  3. Other smaller lesions at L4 and L5 are less conspicuous on  today's exam.  4. No new fractures.  5. Stable spondylosis of the lumbar spine.  Assessment/Plan: This is a pleasant, 71 year old, gentleman, with the following issues:  #1 multiple myeloma.  He is status post 3 cycles of Velcade (he did have one dose in the hospital).  Do to increased toxicity and weakness.  We have held the Velcade.  He will continue to receive Zometa 4 mg IV monthly.  He will will receive Zometa today.  Overall, Robert Berger myeloma studies have come down quite nicely.  We will still continued to look at the big picture of things, especially, his quality of life.  We will continue to treat him supportively for the time being.  #2 status post a laparoscopic sigmoid colectomy, secondary to perforated diverticulum.  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  There are no issues with this.  He is still continuing to recover from the surgery.  #3 chronic back pain.  He remains on OxyContin and oxycodone as needed.  #4.  Supportive therapy.  We will continue with Zometa 4 mg IV every 4 weeks.  He will receive Zometa today.  #5.  Robert Berger Decadron is still on hold secondary to the, increase in his glucose levels.  #6 followup Robert Berger will follow back up with [place] in 4 weeks, but before then should there be questions or concerns.

## 2012-06-07 NOTE — Patient Instructions (Signed)
Zoledronic Acid injection (Hypercalcemia, Oncology) What is this medicine? ZOLEDRONIC ACID (ZOE le dron ik AS id) lowers the amount of calcium loss from bone. It is used to treat too much calcium in your blood from cancer. It is also used to prevent complications of cancer that has spread to the bone. This medicine may be used for other purposes; ask your health care provider or pharmacist if you have questions. What should I tell my health care provider before I take this medicine? They need to know if you have any of these conditions: -aspirin-sensitive asthma -dental disease -kidney disease -an unusual or allergic reaction to zoledronic acid, other medicines, foods, dyes, or preservatives -pregnant or trying to get pregnant -breast-feeding How should I use this medicine? This medicine is for infusion into a vein. It is given by a health care professional in a hospital or clinic setting. Talk to your pediatrician regarding the use of this medicine in children. Special care may be needed. Overdosage: If you think you have taken too much of this medicine contact a poison control center or emergency room at once. NOTE: This medicine is only for you. Do not share this medicine with others. What if I miss a dose? It is important not to miss your dose. Call your doctor or health care professional if you are unable to keep an appointment. What may interact with this medicine? -certain antibiotics given by injection -NSAIDs, medicines for pain and inflammation, like ibuprofen or naproxen -some diuretics like bumetanide, furosemide -teriparatide -thalidomide This list may not describe all possible interactions. Give your health care provider a list of all the medicines, herbs, non-prescription drugs, or dietary supplements you use. Also tell them if you smoke, drink alcohol, or use illegal drugs. Some items may interact with your medicine. What should I watch for while using this medicine? Visit  your doctor or health care professional for regular checkups. It may be some time before you see the benefit from this medicine. Do not stop taking your medicine unless your doctor tells you to. Your doctor may order blood tests or other tests to see how you are doing. Women should inform their doctor if they wish to become pregnant or think they might be pregnant. There is a potential for serious side effects to an unborn child. Talk to your health care professional or pharmacist for more information. You should make sure that you get enough calcium and vitamin D while you are taking this medicine. Discuss the foods you eat and the vitamins you take with your health care professional. Some people who take this medicine have severe bone, joint, and/or muscle pain. This medicine may also increase your risk for a broken thigh bone. Tell your doctor right away if you have pain in your upper leg or groin. Tell your doctor if you have any pain that does not go away or that gets worse. What side effects may I notice from receiving this medicine? Side effects that you should report to your doctor or health care professional as soon as possible: -allergic reactions like skin rash, itching or hives, swelling of the face, lips, or tongue -anxiety, confusion, or depression -breathing problems -changes in vision -feeling faint or lightheaded, falls -jaw burning, cramping, pain -muscle cramps, stiffness, or weakness -trouble passing urine or change in the amount of urine Side effects that usually do not require medical attention (report to your doctor or health care professional if they continue or are bothersome): -bone, joint, or muscle pain -  fever -hair loss -irritation at site where injected -loss of appetite -nausea, vomiting -stomach upset -tired This list may not describe all possible side effects. Call your doctor for medical advice about side effects. You may report side effects to FDA at  1-800-FDA-1088. Where should I keep my medicine? This drug is given in a hospital or clinic and will not be stored at home. NOTE: This sheet is a summary. It may not cover all possible information. If you have questions about this medicine, talk to your doctor, pharmacist, or health care provider.  2012, [employer]. (01/22/2011 9:06:58 AM) 

## 2012-06-07 NOTE — Telephone Encounter (Signed)
Pt aware all appointments cx except 11-27 to see MD and get zometa

## 2012-06-09 LAB — KAPPA/LAMBDA LIGHT CHAINS
Kappa free light chain: 0.16 mg/dL — ABNORMAL LOW (ref 0.33–1.94)
Lambda Free Lght Chn: 0.82 mg/dL (ref 0.57–2.63)

## 2012-06-09 LAB — PROTEIN ELECTROPHORESIS, SERUM, WITH REFLEX
Beta 2: 4.5 % (ref 3.2–6.5)
Beta Globulin: 5.6 % (ref 4.7–7.2)

## 2012-06-09 LAB — IFE INTERPRETATION

## 2012-06-14 ENCOUNTER — Ambulatory Visit: Payer: Medicare Other

## 2012-06-14 ENCOUNTER — Ambulatory Visit: Payer: BC Managed Care – PPO | Admitting: Hematology & Oncology

## 2012-06-14 ENCOUNTER — Other Ambulatory Visit: Payer: BC Managed Care – PPO | Admitting: Lab

## 2012-06-14 ENCOUNTER — Other Ambulatory Visit: Payer: Medicare Other | Admitting: Lab

## 2012-06-14 ENCOUNTER — Ambulatory Visit: Payer: BC Managed Care – PPO

## 2012-06-21 ENCOUNTER — Ambulatory Visit: Payer: BC Managed Care – PPO

## 2012-06-21 ENCOUNTER — Other Ambulatory Visit: Payer: BC Managed Care – PPO | Admitting: Lab

## 2012-06-29 ENCOUNTER — Ambulatory Visit (INDEPENDENT_AMBULATORY_CARE_PROVIDER_SITE_OTHER): Payer: BC Managed Care – PPO | Admitting: Surgery

## 2012-06-29 ENCOUNTER — Encounter (INDEPENDENT_AMBULATORY_CARE_PROVIDER_SITE_OTHER): Payer: Self-pay | Admitting: Surgery

## 2012-06-29 VITALS — BP 116/64 | HR 64 | Temp 97.5°F | Resp 16 | Ht 68.0 in | Wt 200.0 lb

## 2012-06-29 DIAGNOSIS — K5732 Diverticulitis of large intestine without perforation or abscess without bleeding: Secondary | ICD-10-CM

## 2012-06-29 DIAGNOSIS — K5720 Diverticulitis of large intestine with perforation and abscess without bleeding: Secondary | ICD-10-CM | POA: Insufficient documentation

## 2012-06-29 NOTE — Patient Instructions (Signed)
Colostomy Home Guide A colostomy is an opening for stool to leave your body when a medical condition prevents it from leaving through the usual opening (rectum). During a surgery, a piece of large intestine (colon) is brought through a hole in the abdominal wall. The new opening is called a stoma or ostomy. A bag or pouch fits over the stoma to catch stool and gas. Your stool may be liquid, somewhat pasty, or formed. CARING FOR YOUR STOMA  Normally, the stoma looks a lot like the inside of your cheek: pink, red, and moist. At first it may be swollen, but this swelling will decrease within 6 weeks. Keep the skin around your stoma clean and dry. You can gently wash your stoma and the skin around your stoma in the shower with a clean, soft washcloth. If you develop any skin irritation, your caregiver may give you a stoma powder or ointment to help heal the area. Do not use any products other than those specifically given to you by your caregiver.  Your stoma should not be uncomfortable. If you notice any stinging or burning, your pouch may be leaking, and the skin around your stoma may be coming into contact with stool. This can cause skin irritation. If you notice stinging, replace your pouch with a new one and discard the old one. OSTOMY POUCHES  The pouch that fits over the ostomy can be made up of either 1 or 2 pieces. A one-piece pouch has a skin barrier piece and the pouch itself in one unit. A two-piece pouch has a skin barrier with a separate pouch that snaps on and off of the skin barrier. Either way, you should empty the pouch when it is only  to  full. Do not let more stool or gas build up. This could cause the pouch to leak. Some ostomy bags have a built-in gas release valve. Ostomy deodorizer (5 drops) can be put into the pouch to prevent odor. Some people use ostomy lubricant drops inside the pouch to help the stool slide out of the bag more easily and completely.  EMPTYING YOUR OSTOMY POUCH    You may get lessons on how to empty your pouch from a wound-ostomy nurse before you leave the hospital. Here are the basic steps:  Wash your hands with soap and water.  Sit far back on the toilet.  Put several pieces of toilet paper into the toilet water. This will prevent splashing as you empty the stool into the toilet bowl.  Unclip or unvelcro the tail end of the pouch.  Unroll the tail and empty stool into the toilet.  Clean the tail with toilet paper.  Reroll the tail, and clip or velcro it closed.  Wash your hands again. CHANGING YOUR OSTOMY POUCH  Change your ostomy pouch about every 3 to 4 days for the first 6 weeks, then every 5 to7 days. Always change the bag sooner if there is any leakage or you begin to notice any discomfort or irritation of the skin around the stoma. When possible, plan to change your ostomy pouch before eating or drinking as this will lessen the chance of stool coming out during the pouch change. A wound-ostomy nurse may teach you how to change your pouch before you leave the hospital. Here are the basic steps:  Lay out your supplies.  Wash your hands with soap and water.  Carefully remove the old pouch.  Wash the stoma and allow it to dry. Men may be   advised to shave any hair around the stoma very carefully. This will make the adhesive stick better.  Use the stoma measuring guide that comes with your pouch set to decide what size hole you will need to cut in the skin barrier piece. Choose the smallest possible size that will hold the stoma but will not touch it.  Use the guide to trace the circle on the back of the skin barrier piece. Cut out the hole.  Hold the skin barrier piece over the stoma to make sure the hole is the correct size.  Remove the adhesive paper backing from the skin barrier piece.  Squeeze stoma paste around the opening of the skin barrier piece.  Clean and dry the skin around the stoma again.  Carefully fit the skin  barrier piece over your stoma.  If you are using a two-piece pouch, snap the pouch onto the skin barrier piece.  Close the tail of the pouch.  Put your hand over the top of the skin barrier piece to help warm it for about 5 minutes, so that it conforms to your body better.  Wash your hands again. DIET TIPS   Continue to follow your usual diet.  Drink about eight 8 oz glasses of water each day.  You can prevent gas by eating slowly and chewing your food thoroughly.  If you feel concerned that you have too much gas, you can cut back on gas-producing foods, such as:  Spicy foods.  Onions and garlic.  Cruciferous vegetables (cabbage, broccoli, cauliflower, Brussels sprouts).  Beans and legumes.  Some cheeses.  Eggs.  Fish.  Bubbly (carbonated) drinks.  Chewing gum. GENERAL TIPS   You can shower with or without the bag in place.  Always keep the bag on if you are bathing or swimming.  If your bag gets wet, you can dry it with a blow-dryer set to cool.  Avoid wearing tight clothing directly over your stoma so that it does not become irritated or bleed. Tight clothing can also prevent stool from draining into the pouch.  It is helpful to always have an extra skin barrier and pouch with you when traveling. Do not leave them anywhere too warm, as parts of them can melt.  Do not let your seat belt rest on your stoma. Try to keep the seat belt either above or below your stoma, or use a tiny pillow to cushion it.  You can still participate in sports, but you should avoid activities in which there is a risk of getting hit in the abdomen.  You can still have sex. It is a good idea to empty your pouch prior to sex. Some people and their partners feel very comfortable seeing the pouch during sex. Others choose to wear lingerie or a T-shirt that covers the device. SEEK IMMEDIATE MEDICAL CARE IF:  You notice a change in the size or color of the stoma, especially if it becomes  very red, purple, black, or pale white.  You have bloody stools or bleeding from the stoma.  You have abdominal pain, nausea, vomiting, or bloating.  There is anything unusual protruding from the stoma.  You have irritation or red skin around the stoma.  No stool is passing from the stoma.  You have diarrhea (requiring more frequent than normal pouch emptying). Document Released: 07/29/2003 Document Revised: 10/18/2011 Document Reviewed: 12/23/2010 ExitCare Patient Information 2013 [employer].  

## 2012-06-29 NOTE — Progress Notes (Signed)
General Surgery [facility]  Visit Diagnoses: 1. Diverticulitis of colon with perforation     HISTORY: Patient is a 71 year old white male who underwent emergent laparotomy and sigmoid colectomy for perforated sigmoid diverticular disease. He has a descending colostomy which is functioning normally. He is tolerating a regular diet. He has no specific complaints at this time.  EXAM: Abdomen is soft without distention. Midline abdominal incision is well healed. No sign of herniation. Colostomy is present in the left mid abdomen and appears to be functioning normally. No sign of parastomal hernia. Remainder of the abdomen is soft and nontender without mass.  IMPRESSION: Status post Hartman's resection for perforated sigmoid diverticulitis  PLAN: Patient is currently at [facility] nursing facility. He is scheduled to see Dr. Myna Hidalgo next week. He continues to be treated for multiple myeloma.  At this point the patient is not certain whether he wishes to have his colostomy reversed in the future.  I will ask him to return in 4 months. We will assess his status at that time and make a decision regarding possible reversal of his colostomy. I did into size to him today that there is no medical reason to undergo colostomy reversal. He can certainly live a normal, functional life with his descending colostomy in place. Patient understands and will return in the Spring.  Velora Heckler, MD, FACS General & Endocrine Surgery [facility]

## 2012-07-05 ENCOUNTER — Ambulatory Visit: Payer: BC Managed Care – PPO

## 2012-07-05 ENCOUNTER — Other Ambulatory Visit (HOSPITAL_BASED_OUTPATIENT_CLINIC_OR_DEPARTMENT_OTHER): Payer: BC Managed Care – PPO

## 2012-07-05 ENCOUNTER — Telehealth: Payer: Self-pay | Admitting: Hematology & Oncology

## 2012-07-05 ENCOUNTER — Other Ambulatory Visit: Payer: BC Managed Care – PPO | Admitting: Lab

## 2012-07-05 ENCOUNTER — Ambulatory Visit (HOSPITAL_BASED_OUTPATIENT_CLINIC_OR_DEPARTMENT_OTHER): Payer: BC Managed Care – PPO | Admitting: Hematology & Oncology

## 2012-07-05 VITALS — BP 98/68 | HR 70 | Temp 98.6°F | Resp 18 | Ht 68.0 in

## 2012-07-05 DIAGNOSIS — I2699 Other pulmonary embolism without acute cor pulmonale: Secondary | ICD-10-CM

## 2012-07-05 DIAGNOSIS — C9000 Multiple myeloma not having achieved remission: Secondary | ICD-10-CM

## 2012-07-05 LAB — CBC WITH DIFFERENTIAL (CANCER CENTER ONLY)
BASO%: 0.7 % (ref 0.0–2.0)
Eosinophils Absolute: 0.2 10*3/uL (ref 0.0–0.5)
LYMPH#: 0.5 10*3/uL — ABNORMAL LOW (ref 0.9–3.3)
MCV: 96 fL (ref 82–98)
MONO#: 0.4 10*3/uL (ref 0.1–0.9)
NEUT#: 3.4 10*3/uL (ref 1.5–6.5)
Platelets: 231 10*3/uL (ref 145–400)
RBC: 3.65 10*6/uL — ABNORMAL LOW (ref 4.20–5.70)
WBC: 4.6 10*3/uL (ref 4.0–10.0)

## 2012-07-05 NOTE — Progress Notes (Signed)
This office note has been dictated.

## 2012-07-05 NOTE — Telephone Encounter (Addendum)
Message copied by Cathi Roan on Wed Jul 05, 2012 12:48 PM ------      Message from: [place], [place]      Created: Tue Jun 13, 2012  1:39 PM                   ----- Message -----         From: [name], MD         Sent: 06/09/2012   9:28 PM           To: Nanci Pina Nurse Hp            Call - myeloma still is responding to therapy!!  This is great!!!!   Cindee Lame  07-05-12  I will discuss with patient today at [facility].  Lupita Raider LPN

## 2012-07-10 LAB — COMPREHENSIVE METABOLIC PANEL
ALT: 31 U/L (ref 0–53)
Albumin: 3.1 g/dL — ABNORMAL LOW (ref 3.5–5.2)
CO2: 27 mEq/L (ref 19–32)
Chloride: 103 mEq/L (ref 96–112)
Glucose, Bld: 166 mg/dL — ABNORMAL HIGH (ref 70–99)
Potassium: 4 mEq/L (ref 3.5–5.3)
Sodium: 140 mEq/L (ref 135–145)
Total Protein: 5.3 g/dL — ABNORMAL LOW (ref 6.0–8.3)

## 2012-07-10 LAB — PROTEIN ELECTROPHORESIS, SERUM, WITH REFLEX
Albumin ELP: 51.3 % — ABNORMAL LOW (ref 55.8–66.1)
M-Spike, %: 0.1 g/dL
Total Protein, Serum Electrophoresis: 5.3 g/dL — ABNORMAL LOW (ref 6.0–8.3)

## 2012-07-10 LAB — KAPPA/LAMBDA LIGHT CHAINS
Kappa:Lambda Ratio: 0.91 (ref 0.26–1.65)
Lambda Free Lght Chn: 1.07 mg/dL (ref 0.57–2.63)

## 2012-07-10 LAB — IGG, IGA, IGM: IgM, Serum: 132 mg/dL (ref 41–251)

## 2012-07-10 NOTE — Progress Notes (Signed)
CC:   Robert Berger. Evlyn Kanner, M.D. [facility], [place] [phone]  DIAGNOSES: 1. IgG kappa myeloma-complete response by lab work. 2. Pulmonary embolism/deep venous thrombosis. 3. Perforated colon.  CURRENT THERAPY:  Zometa 4 mg IV monthly.  INTERIM HISTORY:  [name] comes in for his followup.  He is off chemotherapy now for the myeloma.  He has had a fantastic response.  His last myeloma studies back in October showed no monoclonal spike in his serum.  His IgG level was down to 545 mg/dL.  Kappa light chain was 0.16 mg/dL.  He only had been on Velcade.  I never gave him Revlimid.  He last got Zometa back on October 30th.  I think we can hold on this today.  He is at a rehab facility.  He is doing better with this.  He did undergo another, I think, kyphoplasty procedure when he was last hospitalized.  He is doing 2 hours of physical therapy a day.  His appetite is coming back a little bit.  His wife had a bout of depression.  I do not think she was hospitalized, however.  PHYSICAL EXAMINATION:  General:  This is an elderly appearing white gentleman in no obvious distress.  He comes in on a stretcher.  Vital signs:  98.1, pulse 98, respiratory rate 20, blood pressure 98/68.  Head and neck:  Normocephalic, atraumatic skull.  There are no ocular or oral lesions.  There are no palpable cervical or supraclavicular lymph nodes. Lungs:  Clear bilaterally.  Cardiac:  Regular rate and rhythm with a normal S1 and S2.  There are no murmurs, rubs, or bruits.  Abdomen: Soft with good bowel sounds.  There is no palpable abdominal mass. There is no palpable hepatosplenomegaly.  His laparotomy scar is well healed.  His colostomy is intact.  Extremities:  3+ edema in his legs. No obvious venous cord is noted.  Neurological:  No focal neurological deficits.  LABORATORY STUDIES:  White cell count is 4.6, hemoglobin 11.2, hematocrit 35.2, platelet count 231.  IMPRESSION:  [name]  is a 71 year old gentleman with IgG kappa myeloma.  Again, he is off therapy now.  His response has been incredibly quick.  I think he has more problems now other than his myeloma.  I just think that continuing him on chemotherapy is not going to improve his quality of life.  We will get him back in about 3 weeks for Zometa.  I do think this is going to be helpful for him.  Hopefully, he will be able to walk in here one day and be independent.    ______________________________ [name], M.D. PRE/MEDQ  D:  07/05/2012  T:  07/06/2012  Job:  [id number]

## 2012-07-27 ENCOUNTER — Ambulatory Visit: Payer: BC Managed Care – PPO

## 2012-07-27 ENCOUNTER — Ambulatory Visit (HOSPITAL_BASED_OUTPATIENT_CLINIC_OR_DEPARTMENT_OTHER): Payer: BC Managed Care – PPO | Admitting: Medical

## 2012-07-27 ENCOUNTER — Other Ambulatory Visit (HOSPITAL_BASED_OUTPATIENT_CLINIC_OR_DEPARTMENT_OTHER): Payer: BC Managed Care – PPO | Admitting: Lab

## 2012-07-27 ENCOUNTER — Other Ambulatory Visit: Payer: BC Managed Care – PPO | Admitting: Lab

## 2012-07-27 ENCOUNTER — Ambulatory Visit: Payer: BC Managed Care – PPO | Admitting: Medical

## 2012-07-27 ENCOUNTER — Ambulatory Visit (HOSPITAL_BASED_OUTPATIENT_CLINIC_OR_DEPARTMENT_OTHER): Payer: BC Managed Care – PPO

## 2012-07-27 VITALS — BP 118/75 | HR 130 | Temp 98.3°F | Resp 18

## 2012-07-27 DIAGNOSIS — Z86718 Personal history of other venous thrombosis and embolism: Secondary | ICD-10-CM

## 2012-07-27 DIAGNOSIS — C9000 Multiple myeloma not having achieved remission: Secondary | ICD-10-CM

## 2012-07-27 DIAGNOSIS — R5381 Other malaise: Secondary | ICD-10-CM

## 2012-07-27 DIAGNOSIS — Z86711 Personal history of pulmonary embolism: Secondary | ICD-10-CM

## 2012-07-27 LAB — CBC WITH DIFFERENTIAL (CANCER CENTER ONLY)
BASO#: 0 10*3/uL (ref 0.0–0.2)
Eosinophils Absolute: 0.2 10*3/uL (ref 0.0–0.5)
HCT: 41.7 % (ref 38.7–49.9)
HGB: 13.6 g/dL (ref 13.0–17.1)
LYMPH%: 13 % — ABNORMAL LOW (ref 14.0–48.0)
MCH: 29.6 pg (ref 28.0–33.4)
MCV: 91 fL (ref 82–98)
MONO%: 10.3 % (ref 0.0–13.0)
NEUT#: 4.9 10*3/uL (ref 1.5–6.5)
RBC: 4.6 10*6/uL (ref 4.20–5.70)

## 2012-07-27 LAB — CMP (CANCER CENTER ONLY)
Alkaline Phosphatase: 84 U/L (ref 26–84)
Creat: 0.7 mg/dl (ref 0.6–1.2)
Glucose, Bld: 143 mg/dL — ABNORMAL HIGH (ref 73–118)
Sodium: 139 mEq/L (ref 128–145)
Total Bilirubin: 0.7 mg/dl (ref 0.20–1.60)
Total Protein: 7 g/dL (ref 6.4–8.1)

## 2012-07-27 MED ORDER — SODIUM CHLORIDE 0.9 % IV SOLN
Freq: Once | INTRAVENOUS | Status: AC
  Administered 2012-07-27: 15:00:00 via INTRAVENOUS

## 2012-07-27 MED ORDER — ZOLEDRONIC ACID 4 MG/5ML IV CONC
4.0000 mg | Freq: Once | INTRAVENOUS | Status: AC
  Administered 2012-07-27: 4 mg via INTRAVENOUS
  Filled 2012-07-27: qty 5

## 2012-07-27 NOTE — Progress Notes (Signed)
Diagnoses: #1.  IgG kappa myeloma-complete response by laboratory. #2.  Pulmonary embolism/deep venous thrombosis. #3.  Perforated colon.  Current therapy: Zometa 4 mg IV monthly.  Interim history: Robert Berger presents today for an office followup visit.  He is currently off Velcade for the time being.  He had a remarkable response.  His last myeloma studies back in November revealed an M spike of 0.1, 0 g/dL, and IgG of 621 mg/dL, and a, kappa free light chain of 0..97 mg/dL. He has done quite well.  Again, he, only had Velcade.  He was never given Revlimid.  He continues on Zometa monthly.  He is out of skilled nursing rehabilitation, and at home.  He is still continuing to work with physical and occupational therapy.  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  He is unable to get around independently without a cane or with wheelchair, however, he is able to get around better than before.  He is on Xarelto without any problems.  He does not report any obvious, or abnormal bleeding.  His appetite is fair.  He denies any nausea, vomiting, diarrhea, constipation.  He does have a colostomy bag.  He denies any fevers, chills, or night sweats, any palpable neuropathy, any cough, chest pain, or shortness of breath.  He denies any headaches, visual changes, or rashes.  He has minimal lower leg swelling.  He is urinating without any problems.  Review of Systems: Constitutional:Negative for malaise/fatigue, fever, chills, weight loss, diaphoresis, activity change, appetite change, and unexpected weight change.  HEENT: Negative for double vision, blurred vision, visual loss, ear pain, tinnitus, congestion, rhinorrhea, epistaxis sore throat or sinus disease, oral pain/lesion, tongue soreness Respiratory: Negative for cough, chest tightness, shortness of breath, wheezing and stridor.  Cardiovascular: Negative for chest pain, palpitations, leg swelling, orthopnea, PND, DOE or claudication Gastrointestinal: Negative for  nausea, vomiting, abdominal pain, diarrhea, constipation, blood in stool, melena, hematochezia, abdominal distention, anal bleeding, rectal pain, anorexia and hematemesis.  Genitourinary: Negative for dysuria, frequency, hematuria,  Musculoskeletal: Negative for myalgias, back pain, joint swelling, arthralgias and gait problem.  Skin: Negative for rash, color change, pallor and wound.  Neurological:. Negative for dizziness/light-headedness, tremors, seizures, syncope, facial asymmetry, speech difficulty, weakness, numbness, headaches and paresthesias.  Hematological: Negative for adenopathy. Does not bruise/bleed easily.  Psychiatric/Behavioral:  Negative for depression, no loss of interest in normal activity or change in sleep pattern.   Physical Exam: This is an elderly, 71 year old, white gentleman, in no obvious distress Vitals: Temperature 98.6 degrees, pulse 70, respirations 18, blood pressure 98/68, weight not taken as patient is on a stretcher. HEENT reveals a normocephalic, atraumatic skull, no scleral icterus, no oral lesions  Neck is supple without any cervical or supraclavicular adenopathy.  Lungs are clear to auscultation bilaterally. There are no wheezes, rales or rhonci Cardiac is regular rate and rhythm with a normal S1 and S2. There are no murmurs, rubs, or bruits.  Abdomen is soft with good bowel sounds, there is no palpable mass. There is no palpable hepatosplenomegaly. There is no palpable fluid wave.  Musculoskeletal no tenderness of the spine, ribs, or hips.  Extremities there are no clubbing, cyanosis, 1+ edema bilaterally in his lower extremities Skin no petechia, purpura or ecchymosis Neurologic is nonfocal.  Laboratory Data: White count 6.6, hemoglobin 13.6, hematocrit 41.7, platelets 213,000 Sodium 139, potassium 4.3, creatinine 0.7, albumin 3.2, calcium 9.3   Current Outpatient Prescriptions on File Prior to Visit  Medication Sig Dispense Refill  . amLODipine  (  NORVASC) 5 MG tablet Take 5 mg by mouth every morning.      . baclofen (LIORESAL) 10 MG tablet Take 10 mg by mouth 3 (three) times daily.      . bisacodyl (BISAC-EVAC) 5 MG EC tablet Take 5-10 mg by mouth at bedtime.      Marland Kitchen dexamethasone (DECADRON) 4 MG tablet       . diltiazem (CARDIZEM) 30 MG tablet Take 1 tablet (30 mg total) by mouth 4 (four) times daily.  120 tablet  1  . famciclovir (FAMVIR) 500 MG tablet       . fondaparinux (ARIXTRA) 7.5 MG/0.6ML SOLN Inject 0.6 mLs (7.5 mg total) into the skin daily.  3 mL  1  . furosemide (LASIX) 20 MG tablet Take 1 tablet (20 mg total) by mouth daily.  30 tablet  1  . insulin glargine (LANTUS) 100 UNIT/ML injection Inject 5 Units into the skin at bedtime.  10 mL  1  . ondansetron (ZOFRAN) 4 MG tablet Take 4 mg by mouth every 8 (eight) hours as needed. Nausea      . oxyCODONE (OXY IR/ROXICODONE) 5 MG immediate release tablet Take 1 tablet (5 mg total) by mouth every 4 (four) hours as needed. Breakthrough pain  65 tablet  0  . oxyCODONE (OXYCONTIN) 20 MG 12 hr tablet Take 1 tablet (20 mg total) by mouth every 12 (twelve) hours.  60 tablet  0  . pantoprazole (PROTONIX) 40 MG tablet Take 1 tablet (40 mg total) by mouth daily.  30 tablet  0  . potassium chloride 20 MEQ/15ML (10%) solution Take 15 mLs (20 mEq total) by mouth daily.  500 mL  1  . zolpidem (AMBIEN) 5 MG tablet Take 5 mg by mouth as needed. sleep        Assessment/Plan: This is a 71 year old white gentleman, with the following issues:  #1.  IgG kappa myeloma.  Again, he is off therapy now.  He had an incredibly quick response.  His quality of life has improved.  We will continue to monitor his myeloma studies closely  #2.  Supportive therapy.  Receive Zometa 4 mg IV monthly.   #3.  Deconditioning.  He continues with occupational and physical therapy at home.  #4.  Followup.  We will follow back up with Robert Berger in 4 weeks, but before then should there be questions or concerns.

## 2012-07-27 NOTE — Addendum Note (Signed)
Addended by: Cathi Roan on: 07/27/2012 03:24 PM   Modules accepted: Orders

## 2012-07-27 NOTE — Patient Instructions (Signed)
Zoledronic Acid injection (Hypercalcemia, Oncology) What is this medicine? ZOLEDRONIC ACID (ZOE le dron ik AS id) lowers the amount of calcium loss from bone. It is used to treat too much calcium in your blood from cancer. It is also used to prevent complications of cancer that has spread to the bone. This medicine may be used for other purposes; ask your health care provider or pharmacist if you have questions. What should I tell my health care provider before I take this medicine? They need to know if you have any of these conditions: -aspirin-sensitive asthma -dental disease -kidney disease -an unusual or allergic reaction to zoledronic acid, other medicines, foods, dyes, or preservatives -pregnant or trying to get pregnant -breast-feeding How should I use this medicine? This medicine is for infusion into a vein. It is given by a health care professional in a hospital or clinic setting. Talk to your pediatrician regarding the use of this medicine in children. Special care may be needed. Overdosage: If you think you have taken too much of this medicine contact a poison control center or emergency room at once. NOTE: This medicine is only for you. Do not share this medicine with others. What if I miss a dose? It is important not to miss your dose. Call your doctor or health care professional if you are unable to keep an appointment. What may interact with this medicine? -certain antibiotics given by injection -NSAIDs, medicines for pain and inflammation, like ibuprofen or naproxen -some diuretics like bumetanide, furosemide -teriparatide -thalidomide This list may not describe all possible interactions. Give your health care provider a list of all the medicines, herbs, non-prescription drugs, or dietary supplements you use. Also tell them if you smoke, drink alcohol, or use illegal drugs. Some items may interact with your medicine. What should I watch for while using this medicine? Visit  your doctor or health care professional for regular checkups. It may be some time before you see the benefit from this medicine. Do not stop taking your medicine unless your doctor tells you to. Your doctor may order blood tests or other tests to see how you are doing. Women should inform their doctor if they wish to become pregnant or think they might be pregnant. There is a potential for serious side effects to an unborn child. Talk to your health care professional or pharmacist for more information. You should make sure that you get enough calcium and vitamin D while you are taking this medicine. Discuss the foods you eat and the vitamins you take with your health care professional. Some people who take this medicine have severe bone, joint, and/or muscle pain. This medicine may also increase your risk for a broken thigh bone. Tell your doctor right away if you have pain in your upper leg or groin. Tell your doctor if you have any pain that does not go away or that gets worse. What side effects may I notice from receiving this medicine? Side effects that you should report to your doctor or health care professional as soon as possible: -allergic reactions like skin rash, itching or hives, swelling of the face, lips, or tongue -anxiety, confusion, or depression -breathing problems -changes in vision -feeling faint or lightheaded, falls -jaw burning, cramping, pain -muscle cramps, stiffness, or weakness -trouble passing urine or change in the amount of urine Side effects that usually do not require medical attention (report to your doctor or health care professional if they continue or are bothersome): -bone, joint, or muscle pain -  fever -hair loss -irritation at site where injected -loss of appetite -nausea, vomiting -stomach upset -tired This list may not describe all possible side effects. Call your doctor for medical advice about side effects. You may report side effects to FDA at  1-800-FDA-1088. Where should I keep my medicine? This drug is given in a hospital or clinic and will not be stored at home. NOTE: This sheet is a summary. It may not cover all possible information. If you have questions about this medicine, talk to your doctor, pharmacist, or health care provider.  2012, [employer]. (01/22/2011 9:06:58 AM) 

## 2012-07-31 LAB — KAPPA/LAMBDA LIGHT CHAINS
Kappa:Lambda Ratio: 0.86 (ref 0.26–1.65)
Lambda Free Lght Chn: 1.76 mg/dL (ref 0.57–2.63)

## 2012-07-31 LAB — PROTEIN ELECTROPHORESIS, SERUM, WITH REFLEX
Alpha-1-Globulin: 4.6 % (ref 2.9–4.9)
Gamma Globulin: 13.3 % (ref 11.1–18.8)

## 2012-07-31 LAB — IGG, IGA, IGM: IgA: 89 mg/dL (ref 68–379)

## 2012-07-31 LAB — IFE INTERPRETATION

## 2012-08-01 ENCOUNTER — Other Ambulatory Visit: Payer: Self-pay | Admitting: *Deleted

## 2012-08-01 MED ORDER — RIVAROXABAN 20 MG PO TABS: 20.0000 mg | ORAL_TABLET | Freq: Every day | ORAL | Status: DC

## 2012-08-01 MED ORDER — POTASSIUM CHLORIDE 20 MEQ/15ML (10%) PO LIQD: 20.0000 meq | Freq: Every day | ORAL | Status: DC

## 2012-08-01 MED ORDER — TRAMADOL HCL 50 MG PO TABS: 100.0000 mg | ORAL_TABLET | Freq: Two times a day (BID) | ORAL | Status: DC

## 2012-08-01 MED ORDER — LANSOPRAZOLE 30 MG PO CPDR: 30.0000 mg | DELAYED_RELEASE_CAPSULE | Freq: Every day | ORAL | Status: DC

## 2012-08-01 MED ORDER — BACLOFEN 10 MG PO TABS: 10.0000 mg | ORAL_TABLET | Freq: Three times a day (TID) | ORAL | Status: DC

## 2012-08-01 MED ORDER — DILTIAZEM HCL ER COATED BEADS 120 MG PO CP24: 120.0000 mg | ORAL_CAPSULE | Freq: Every day | ORAL | Status: DC

## 2012-08-01 MED ORDER — FUROSEMIDE 20 MG PO TABS: 20.0000 mg | ORAL_TABLET | Freq: Every day | ORAL | Status: DC

## 2012-08-01 NOTE — Telephone Encounter (Signed)
Patient called asking if Dr. Myna Hidalgo would be willing to order his refills for his medicines after discharge from rehab hospital. He has appt with primary care doc in [place].  Dr. Myna Hidalgo states ok this time.

## 2012-08-14 ENCOUNTER — Telehealth: Payer: Self-pay | Admitting: Hematology & Oncology

## 2012-08-14 ENCOUNTER — Other Ambulatory Visit: Payer: Self-pay | Admitting: *Deleted

## 2012-08-14 DIAGNOSIS — C9000 Multiple myeloma not having achieved remission: Secondary | ICD-10-CM

## 2012-08-14 DIAGNOSIS — R5381 Other malaise: Secondary | ICD-10-CM

## 2012-08-14 DIAGNOSIS — M4810 Ankylosing hyperostosis [Forestier], site unspecified: Secondary | ICD-10-CM

## 2012-08-14 NOTE — Progress Notes (Signed)
Received a call from the pt and his therapist from [place] to have his PT extended for another 5 weeks (2 times per week). The physician who was seeing him prior to being referred to Dr Myna Hidalgo asked he renew his order. He is very close to being able to be transported to and from the office without an ambulance. Placed order in for PT and request made for scheduling to set up.

## 2012-08-14 NOTE — Telephone Encounter (Signed)
Talked with Kia at [facility] to refer pt for rehab faxed to [phone]. Phone intake # [id number]. Per Amy RN agency needs new order.

## 2012-08-23 ENCOUNTER — Other Ambulatory Visit (HOSPITAL_BASED_OUTPATIENT_CLINIC_OR_DEPARTMENT_OTHER): Payer: BC Managed Care – PPO | Admitting: Lab

## 2012-08-23 ENCOUNTER — Ambulatory Visit (HOSPITAL_BASED_OUTPATIENT_CLINIC_OR_DEPARTMENT_OTHER): Payer: BC Managed Care – PPO | Admitting: Hematology & Oncology

## 2012-08-23 ENCOUNTER — Other Ambulatory Visit: Payer: Self-pay | Admitting: *Deleted

## 2012-08-23 ENCOUNTER — Ambulatory Visit (HOSPITAL_BASED_OUTPATIENT_CLINIC_OR_DEPARTMENT_OTHER): Payer: BC Managed Care – PPO

## 2012-08-23 VITALS — BP 128/94 | HR 121 | Temp 98.6°F | Resp 18 | Ht 68.0 in | Wt 204.0 lb

## 2012-08-23 DIAGNOSIS — L03319 Cellulitis of trunk, unspecified: Secondary | ICD-10-CM

## 2012-08-23 DIAGNOSIS — L0291 Cutaneous abscess, unspecified: Secondary | ICD-10-CM

## 2012-08-23 DIAGNOSIS — C9000 Multiple myeloma not having achieved remission: Secondary | ICD-10-CM

## 2012-08-23 DIAGNOSIS — Z86718 Personal history of other venous thrombosis and embolism: Secondary | ICD-10-CM

## 2012-08-23 DIAGNOSIS — Z5112 Encounter for antineoplastic immunotherapy: Secondary | ICD-10-CM

## 2012-08-23 DIAGNOSIS — L02219 Cutaneous abscess of trunk, unspecified: Secondary | ICD-10-CM

## 2012-08-23 DIAGNOSIS — I10 Essential (primary) hypertension: Secondary | ICD-10-CM

## 2012-08-23 DIAGNOSIS — M549 Dorsalgia, unspecified: Secondary | ICD-10-CM

## 2012-08-23 LAB — CBC WITH DIFFERENTIAL (CANCER CENTER ONLY)
BASO#: 0 10*3/uL (ref 0.0–0.2)
BASO%: 0.3 % (ref 0.0–2.0)
EOS%: 1.3 % (ref 0.0–7.0)
HCT: 45.3 % (ref 38.7–49.9)
LYMPH#: 1.1 10*3/uL (ref 0.9–3.3)
LYMPH%: 16.6 % (ref 14.0–48.0)
MCH: 28.7 pg (ref 28.0–33.4)
MCHC: 32.9 g/dL (ref 32.0–35.9)
MONO%: 9.7 % (ref 0.0–13.0)
NEUT%: 72.1 % (ref 40.0–80.0)
RDW: 14.9 % (ref 11.1–15.7)

## 2012-08-23 LAB — CMP (CANCER CENTER ONLY)
Albumin: 3.3 g/dL (ref 3.3–5.5)
BUN, Bld: 9 mg/dL (ref 7–22)
CO2: 27 mEq/L (ref 18–33)
Calcium: 9.2 mg/dL (ref 8.0–10.3)
Chloride: 102 mEq/L (ref 98–108)
Glucose, Bld: 170 mg/dL — ABNORMAL HIGH (ref 73–118)
Potassium: 4 mEq/L (ref 3.3–4.7)
Sodium: 138 mEq/L (ref 128–145)
Total Protein: 7.5 g/dL (ref 6.4–8.1)

## 2012-08-23 MED ORDER — SODIUM CHLORIDE 0.9 % IV SOLN
400.0000 mg | Freq: Once | INTRAVENOUS | Status: AC
  Administered 2012-08-23: 400 mg via INTRAVENOUS
  Filled 2012-08-23: qty 8

## 2012-08-23 MED ORDER — ONDANSETRON HCL 8 MG PO TABS
8.0000 mg | ORAL_TABLET | Freq: Once | ORAL | Status: AC
  Administered 2012-08-23: 8 mg via ORAL

## 2012-08-23 MED ORDER — DILTIAZEM HCL ER COATED BEADS 120 MG PO CP24: 120.0000 mg | ORAL_CAPSULE | Freq: Every day | ORAL | Status: DC

## 2012-08-23 MED ORDER — ZOLEDRONIC ACID 4 MG/100ML IV SOLN
4.0000 mg | Freq: Once | INTRAVENOUS | Status: AC
  Administered 2012-08-23: 4 mg via INTRAVENOUS
  Filled 2012-08-23: qty 100

## 2012-08-23 MED ORDER — SODIUM CHLORIDE 0.9 % IV SOLN
Freq: Once | INTRAVENOUS | Status: AC
  Administered 2012-08-23: 16:00:00 via INTRAVENOUS

## 2012-08-23 MED ORDER — TRAMADOL HCL 50 MG PO TABS: 100.0000 mg | ORAL_TABLET | Freq: Two times a day (BID) | ORAL | Status: DC

## 2012-08-23 MED ORDER — BORTEZOMIB CHEMO SQ INJECTION 3.5 MG (2.5MG/ML)
1.3000 mg/m2 | Freq: Once | INTRAMUSCULAR | Status: AC
  Administered 2012-08-23: 3 mg via SUBCUTANEOUS
  Filled 2012-08-23: qty 1.2

## 2012-08-23 NOTE — Progress Notes (Signed)
This office note has been dictated.

## 2012-08-23 NOTE — Patient Instructions (Signed)
You have an appt with Dr Abbey Chatters tomorrow at [facility].  Please arrive at 2:30 for 2:45 appointment.   Abscess An abscess is an infected area that contains a collection of pus and debris.It can occur in almost any part of the body. An abscess is also known as a furuncle or boil. CAUSES  An abscess occurs when tissue gets infected. This can occur from blockage of oil or sweat glands, infection of hair follicles, or a minor injury to the skin. As the body tries to fight the infection, pus collects in the area and creates pressure under the skin. This pressure causes pain. People with weakened immune systems have difficulty fighting infections and get certain abscesses more often.  SYMPTOMS Usually an abscess develops on the skin and becomes a painful mass that is red, warm, and tender. If the abscess forms under the skin, you may feel a moveable soft area under the skin. Some abscesses break open (rupture) on their own, but most will continue to get worse without care. The infection can spread deeper into the body and eventually into the bloodstream, causing you to feel ill.  DIAGNOSIS  Your caregiver will take your medical history and perform a physical exam. A sample of fluid may also be taken from the abscess to determine what is causing your infection. TREATMENT  Your caregiver may prescribe antibiotic medicines to fight the infection. However, taking antibiotics alone usually does not cure an abscess. Your caregiver may need to make a small cut (incision) in the abscess to drain the pus. In some cases, gauze is packed into the abscess to reduce pain and to continue draining the area. HOME CARE INSTRUCTIONS   Only take over-the-counter or prescription medicines for pain, discomfort, or fever as directed by your caregiver.  If you were prescribed antibiotics, take them as directed. Finish them even if you start to feel better.  If gauze is used, follow your caregiver's  directions for changing the gauze.  To avoid spreading the infection:  Keep your draining abscess covered with a bandage.  Wash your hands well.  Do not share personal care items, towels, or whirlpools with others.  Avoid skin contact with others.  Keep your skin and clothes clean around the abscess.  Keep all follow-up appointments as directed by your caregiver. SEEK MEDICAL CARE IF:   You have increased pain, swelling, redness, fluid drainage, or bleeding.  You have muscle aches, chills, or a general ill feeling.  You have a fever. MAKE SURE YOU:   Understand these instructions.  Will watch your condition.  Will get help right away if you are not doing well or get worse. Document Released: 05/05/2005 Document Revised: 01/25/2012 Document Reviewed: 10/08/2011 [facility] Patient Information 2013 [place], [place].

## 2012-08-23 NOTE — Patient Instructions (Signed)
Daptomycin injection What is this medicine? DAPTOMYCIN (DAP toe MYE sin) is a lipopeptide antibiotic. It is used to treat certain kinds of bacterial infections. It will not work for colds, flu, or other viral infections. This medicine may be used for other purposes; ask your health care provider or pharmacist if you have questions. What should I tell my health care provider before I take this medicine? They need to know if you have any of these conditions: -kidney disease -an unusual or allergic reaction to daptomycin, other medicines, foods, dyes, or preservatives -pregnant or trying to get pregnant -breast-feeding How should I use this medicine? This medicine is for infusion into a vein. It is usually given by a health care professional in a hospital or clinic setting. If you get this medicine at home, you will be taught how to prepare and give this medicine. Use exactly as directed. Take your medicine at regular intervals. Do not take your medicine more often than directed. Take all of your medicine as directed even if you think you are better. Do not skip doses or stop your medicine early. It is important that you put your used needles and syringes in a special sharps container. Do not put them in a trash can. If you do not have a sharps container, call your pharmacist or healthcare provider to get one. Talk to your pediatrician regarding the use of this medicine in children. Special care may be needed. Overdosage: If you think you have taken too much of this medicine contact a poison control center or emergency room at once. NOTE: This medicine is only for you. Do not share this medicine with others. What if I miss a dose? If you miss a dose, take it as soon as you can. If it is almost time for your next dose, take only that dose. Do not take double or extra doses. What may interact with this medicine? -some antibiotics like tobramycin This list may not describe all possible interactions.  Give your health care provider a list of all the medicines, herbs, non-prescription drugs, or dietary supplements you use. Also tell them if you smoke, drink alcohol, or use illegal drugs. Some items may interact with your medicine. What should I watch for while using this medicine? Your condition will be monitored carefully while you are receiving this medicine. Do not treat diarrhea with over the counter products. Contact your doctor if you have diarrhea that lasts more than 2 days or if it is severe and watery. What side effects may I notice from receiving this medicine? Side effects that you should report to your doctor or health care professional as soon as possible: -allergic reactions like skin rash, itching or hives, swelling of the face, lips, or tongue -breathing problems -fever, infection -high or low blood pressure -muscle pain -numb or tingling pain -trouble passing urine or change in the amount of urine -unusually tired or weak -vomiting Side effects that usually do not require medical attention (report to your doctor or health care professional if they continue or are bothersome): -constipation or diarrhea -trouble sleeping -headache -nausea -stomach upset This list may not describe all possible side effects. Call your doctor for medical advice about side effects. You may report side effects to FDA at 1-800-FDA-1088. Where should I keep my medicine? Keep out of the reach of children. If you are using this medicine at home, you will be instructed on how to store this medicine. Throw away any unused medicine after the expiration date  on the label. NOTE: This sheet is a summary. It may not cover all possible information. If you have questions about this medicine, talk to your doctor, pharmacist, or health care provider.  2012, Elsevier/Gold Standard. (11/16/2007 5:56:10 PM)Bortezomib injection What is this medicine? BORTEZOMIB (bor TEZ oh mib) is a chemotherapy drug. It slows  the growth of cancer cells. This medicine is used to treat multiple myeloma, lymphoma, and other cancers. This medicine may be used for other purposes; ask your health care provider or pharmacist if you have questions. What should I tell my health care provider before I take this medicine? They need to know if you have any of these conditions: -heart disease -irregular heartbeat -liver disease -low blood counts, like low white blood cells, platelets, or hemoglobin -peripheral neuropathy -taking medicine for blood pressure -an unusual or allergic reaction to bortezomib, mannitol, boron, other medicines, foods, dyes, or preservatives -pregnant or trying to get pregnant -breast-feeding How should I use this medicine? This medicine is for injection into a vein or for injection under the skin. It is given by a health care professional in a hospital or clinic setting. Talk to your pediatrician regarding the use of this medicine in children. Special care may be needed. Overdosage: If you think you have taken too much of this medicine contact a poison control center or emergency room at once. NOTE: This medicine is only for you. Do not share this medicine with others. What if I miss a dose? It is important not to miss your dose. Call your doctor or health care professional if you are unable to keep an appointment. What may interact with this medicine? -medicines for diabetes -medicines to increase blood counts like filgrastim, pegfilgrastim, sargramostim -zalcitabine Talk to your doctor or health care professional before taking any of these medicines: -acetaminophen -aspirin -ibuprofen -ketoprofen -naproxen This list may not describe all possible interactions. Give your health care provider a list of all the medicines, herbs, non-prescription drugs, or dietary supplements you use. Also tell them if you smoke, drink alcohol, or use illegal drugs. Some items may interact with your medicine. What  should I watch for while using this medicine? Visit your doctor for checks on your progress. This drug may make you feel generally unwell. This is not uncommon, as chemotherapy can affect healthy cells as well as cancer cells. Report any side effects. Continue your course of treatment even though you feel ill unless your doctor tells you to stop. You may get drowsy or dizzy. Do not drive, use machinery, or do anything that needs mental alertness until you know how this medicine affects you. Do not stand or sit up quickly, especially if you are an older patient. This reduces the risk of dizzy or fainting spells. In some cases, you may be given additional medicines to help with side effects. Follow all directions for their use. Call your doctor or health care professional for advice if you get a fever, chills or sore throat, or other symptoms of a cold or flu. Do not treat yourself. This drug decreases your body's ability to fight infections. Try to avoid being around people who are sick. This medicine may increase your risk to bruise or bleed. Call your doctor or health care professional if you notice any unusual bleeding. Be careful brushing and flossing your teeth or using a toothpick because you may get an infection or bleed more easily. If you have any dental work done, tell your dentist you are receiving this medicine.  Avoid taking products that contain aspirin, acetaminophen, ibuprofen, naproxen, or ketoprofen unless instructed by your doctor. These medicines may hide a fever. Do not become pregnant while taking this medicine. Women should inform their doctor if they wish to become pregnant or think they might be pregnant. There is a potential for serious side effects to an unborn child. Talk to your health care professional or pharmacist for more information. Do not breast-feed an infant while taking this medicine. You may have vomiting or diarrhea while taking this medicine. Drink water or other  fluids as directed. What side effects may I notice from receiving this medicine? Side effects that you should report to your doctor or health care professional as soon as possible: -allergic reactions like skin rash, itching or hives, swelling of the face, lips, or tongue -breathing problems -changes in hearing -changes in vision -fast, irregular heartbeat -feeling faint or lightheaded, falls -pain, tingling, numbness in the hands or feet -seizures -swelling of the ankles, feet, hands -unusual bleeding or bruising -unusually weak or tired -vomiting Side effects that usually do not require medical attention (report to your doctor or health care professional if they continue or are bothersome): -changes in emotions or moods -constipation -diarrhea -loss of appetite -headache -irritation at site where injected -nausea This list may not describe all possible side effects. Call your doctor for medical advice about side effects. You may report side effects to FDA at 1-800-FDA-1088. Where should I keep my medicine? This drug is given in a hospital or clinic and will not be stored at home. NOTE: This sheet is a summary. It may not cover all possible information. If you have questions about this medicine, talk to your doctor, pharmacist, or health care provider.  2012, Elsevier/Gold Standard. (09/02/2010 11:42:36 AM)

## 2012-08-23 NOTE — Telephone Encounter (Signed)
While pt was here for an appt, he requested a refill for Tramadol and Cardizem CD.  Sent to [facility] via e-rx. This will be his last Cardizem CD refill as Dr Myna Hidalgo was prescribing it until he establishes a PCP, which is to happen next week.

## 2012-08-24 ENCOUNTER — Ambulatory Visit (INDEPENDENT_AMBULATORY_CARE_PROVIDER_SITE_OTHER): Payer: BC Managed Care – PPO | Admitting: General Surgery

## 2012-08-24 ENCOUNTER — Encounter (INDEPENDENT_AMBULATORY_CARE_PROVIDER_SITE_OTHER): Payer: Self-pay | Admitting: General Surgery

## 2012-08-24 ENCOUNTER — Telehealth: Payer: Self-pay | Admitting: Hematology & Oncology

## 2012-08-24 VITALS — BP 122/82 | HR 96 | Temp 98.4°F | Resp 18 | Ht 68.0 in | Wt 204.0 lb

## 2012-08-24 DIAGNOSIS — L02214 Cutaneous abscess of groin: Secondary | ICD-10-CM

## 2012-08-24 DIAGNOSIS — L02219 Cutaneous abscess of trunk, unspecified: Secondary | ICD-10-CM

## 2012-08-24 MED ORDER — DOXYCYCLINE HYCLATE 100 MG PO TABS: 100.0000 mg | ORAL_TABLET | Freq: Two times a day (BID) | ORAL | Status: DC

## 2012-08-24 NOTE — Progress Notes (Signed)
Patient ID: DENISE BRAMBLETT, male   DOB: 01-03-1941, 72 y.o.   MRN: [id number]  Chief Complaint  Patient presents with  . New Evaluation    groin abscess    HPI ANDERSEN IORIO is a 72 y.o. male.   HPI  He is referred by Dr. Myna Hidalgo for evaluation and treatment of a left groin abscess. He had a small area of swelling in the left groin that began 3 days ago. It progressively increased in size then began draining some bloody pus type material.  Past Medical History  Diagnosis Date  . Arthritis   . GERD (gastroesophageal reflux disease)   . Complication of anesthesia     aspiration during surgery, difficult to wake up  . Multiple myeloma   . Status post radiation therapy 03/23/12 - 04/05/12    T8 - L4/ 20 Gy/ 10 Fractions  . Dysphagia 05/06/12    ED Visit    Past Surgical History  Procedure Date  . Fracture surgery     ankle  . Hemorrhoid surgery   . Back surgery     47 years ago  . Laparotomy 05/14/2012    Procedure: EXPLORATORY LAPAROTOMY;  Surgeon: Velora Heckler, MD;  Location: [facility];  Service: General;  Laterality: N/A;  . Colostomy 05/14/2012    Procedure: COLOSTOMY;  Surgeon: Velora Heckler, MD;  Location: [facility];  Service: General;  Laterality: N/A;    Family History  Problem Relation Age of Onset  . Cancer Maternal Grandfather   . Cancer Maternal Grandmother     Social History History  Substance Use Topics  . Smoking status: Former Smoker    Types: Cigarettes    Quit date: 03/09/1978  . Smokeless tobacco: Never Used  . Alcohol Use: Yes     Comment: 1 glass of wine or a burbon daily., none in the last 3 months    Allergies  Allergen Reactions  . Penicillins Swelling    Current Outpatient Prescriptions  Medication Sig Dispense Refill  . baclofen (LIORESAL) 10 MG tablet Take 1 tablet (10 mg total) by mouth 3 (three) times daily.  90 each  0  . diltiazem (CARDIZEM CD) 120 MG 24 hr capsule Take 1 capsule (120 mg total) by mouth daily.  30 capsule  0  .  furosemide (LASIX) 20 MG tablet Take 1 tablet (20 mg total) by mouth daily.  30 tablet  0  . lansoprazole (PREVACID) 30 MG capsule Take 1 capsule (30 mg total) by mouth daily.  30 capsule  0  . methocarbamol (ROBAXIN) 500 MG tablet Take 500 mg by mouth 4 (four) times daily.      Marland Kitchen oxyCODONE (OXY IR/ROXICODONE) 5 MG immediate release tablet Take 1 tablet (5 mg total) by mouth every 4 (four) hours as needed. Breakthrough pain  65 tablet  0  . polyethylene glycol powder (MIRALAX) powder Take 17 g by mouth daily.      . potassium chloride 20 MEQ/15ML (10%) solution Take 15 mLs (20 mEq total) by mouth daily.  500 mL  1  . Rivaroxaban (XARELTO) 20 MG TABS Take 1 tablet (20 mg total) by mouth daily.  30 tablet  0  . sodium chloride (DEEP SEA NASAL SPRAY) 0.65 % nasal spray Place into the nose as needed.      . traMADol (ULTRAM) 50 MG tablet Take 2 tablets (100 mg total) by mouth 2 (two) times daily.  120 tablet  1  . zolpidem (AMBIEN) 5 MG  tablet Take 5 mg by mouth as needed. sleep      . doxycycline (VIBRA-TABS) 100 MG tablet Take 1 tablet (100 mg total) by mouth 2 (two) times daily.  28 tablet  0    Review of Systems Review of Systems  Constitutional: Negative for fever and chills.    Blood pressure 122/82, pulse 96, temperature 98.4 F (36.9 C), temperature source Temporal, resp. rate 18, height 5\' 8"  (1.727 m), weight 204 lb (92.534 kg).  Physical Exam Physical Exam  Constitutional: No distress.       Overweight male  Genitourinary:       Left groin-area of erythema and induration that is spontaneously draining through 2 small holes. It is tender to touch.  He is on Xarelta, so instead of doing a sharp incision and drainage, I dilated the tract the hemostat and drained more blood and pus. I then cleaned the wound with peroxide. A dressing was applied.    Data Reviewed Dr. Gustavo Lah note.  Assessment    Incompletely drained left groin abscess that has now been more widely drained.     Plan    Clean wound twice a day and apply a dressing. Doxycycline for 10 days. If the wound does not heal in 2 weeks I have asked him to call back.       Dejae Bernet J 08/24/2012, 3:49 PM

## 2012-08-24 NOTE — Patient Instructions (Signed)
Clean area with warm tap water and apply a dry dressing twice a day. Take antibiotic as prescribed. Call if the area is not healed in 2 weeks.

## 2012-08-24 NOTE — Telephone Encounter (Signed)
Pt aware of 1-29 appointment °

## 2012-08-24 NOTE — Progress Notes (Signed)
DIAGNOSES: 1. IgG kappa myeloma. 2. Pulmonary embolism/deep venous thrombosis (DVT).  CURRENT THERAPY: 1. The patient to restart Velcade q.2 week dosing. 2. Zometa 4 mg IV q. month. 3. Xarelto 20 mg p.o. daily.  INTERIM HISTORY:  Robert Berger comes in for followup.  He now is home. He did really well at the rehab center.  He is doing his best to try to improve his mobility and strength.  A physical therapist comes out to his house, I think, every other day.  He comes in today in a wheelchair. He usually comes in on a stretcher bought by EMTs.  Unfortunately, it looks like his myeloma is starting to come back.  His last monoclonal spike was up to 0.45 gm/dL.  His IgG level was up to 917 mg/dL.  Kappa light chain was 1.52 mg/dL.  He also has an abscess in the left inguinal region.  He says this opened up and has been draining.  It is somewhat painful.  He has had no problems with his colostomy.  This is working quite well.  He has had no headache.  His appetite has been okay.  He ate fairly well over the holidays.  Overall, his performance status is ECOG 2-3.  PHYSICAL EXAMINATION:  General:  This is an elderly-appearing white gentleman who is in no obvious distress.  Vital signs:  Temperature of 98.6, pulse 120, respiratory rate 18, blood pressure 128/94.  Weight is 204.  Head and neck:  Normocephalic, atraumatic skull.  There are no ocular or oral lesions.  There are no palpable cervical or supraclavicular lymph nodes.  Lungs:  Clear bilaterally.  Cardiac: Tachycardic, but regular.  There are no murmurs, rubs, or bruits. Abdomen:  Soft.  He has a colostomy that is intact.  There is no fluid wave.  There is no distention.  There is no palpable hepatosplenomegaly. Groins:  His inguinal exam does show an abscess in the left inguinal region.  This is fluctuant, somewhat tender, and red.  Bloody purulent material comes out of the abscess under pressure.  Extremities:  No clubbing,  cyanosis, or edema.  Neurological:  No focal neurological deficits.  LABORATORY STUDIES:  White cell count is 6.4, hemoglobin 14.9, hematocrit 45.3, platelet count 214,000.  Sodium 138, potassium 4, BUN 9, creatinine 0.7, glucose 170.  IMPRESSION:  Robert Berger is a 72 year old gentleman with IgG kappa myeloma.  It has been a true "adventure" for him.  He developed a pulmonary embolism and DVT because he was immobile so long.  He then developed a perforated colon.  He required surgery for this.  Our new problem now is this abscess.  This is going to have to be opened up surgically, in my mind.  We did make an appointment for him to have an evaluation tomorrow at [facility].  I did go ahead and give him a dose of Cubicin today.  This should get him through the day.  We will see about antibiotics that the surgeons want to prescribe.  Again, the myeloma does seem to be coming back.  His M spike is going up.  I think we need to get him on Velcade again.  He had a fabulous response to Velcade when we started him on this last year.  We will dose the Velcade every 2 weeks for right now.  I think this will be easiest on him.  We will go ahead and give him Zometa today.  As always, Robert Berger presents a  lot of challenges.  I am just glad to see that he is finally getting stronger.  We will plan to get him back in 2 weeks so that we can see how he is doing.    ______________________________ [name], M.D. PRE/MEDQ  D:  08/23/2012  T:  08/24/2012  Job:  [id number]

## 2012-08-25 LAB — IFE INTERPRETATION

## 2012-08-25 LAB — PROTEIN ELECTROPHORESIS, SERUM, WITH REFLEX
Alpha-1-Globulin: 4.8 % (ref 2.9–4.9)
Beta 2: 5.3 % (ref 3.2–6.5)
Gamma Globulin: 16.3 % (ref 11.1–18.8)

## 2012-08-25 LAB — IGG, IGA, IGM: IgG (Immunoglobin G), Serum: 1440 mg/dL (ref 650–1600)

## 2012-08-29 ENCOUNTER — Ambulatory Visit (INDEPENDENT_AMBULATORY_CARE_PROVIDER_SITE_OTHER): Payer: BC Managed Care – PPO | Admitting: Family

## 2012-08-29 ENCOUNTER — Encounter: Payer: Self-pay | Admitting: Family

## 2012-08-29 VITALS — BP 118/80 | HR 118 | Temp 97.6°F | Resp 18 | Wt 204.0 lb

## 2012-08-29 DIAGNOSIS — D649 Anemia, unspecified: Secondary | ICD-10-CM

## 2012-08-29 DIAGNOSIS — I10 Essential (primary) hypertension: Secondary | ICD-10-CM

## 2012-08-29 DIAGNOSIS — L02219 Cutaneous abscess of trunk, unspecified: Secondary | ICD-10-CM

## 2012-08-29 DIAGNOSIS — R000 Tachycardia, unspecified: Secondary | ICD-10-CM

## 2012-08-29 DIAGNOSIS — R634 Abnormal weight loss: Secondary | ICD-10-CM

## 2012-08-29 DIAGNOSIS — C9000 Multiple myeloma not having achieved remission: Secondary | ICD-10-CM

## 2012-08-29 DIAGNOSIS — Z23 Encounter for immunization: Secondary | ICD-10-CM

## 2012-08-29 DIAGNOSIS — I2782 Chronic pulmonary embolism: Secondary | ICD-10-CM

## 2012-08-29 DIAGNOSIS — I2699 Other pulmonary embolism without acute cor pulmonale: Secondary | ICD-10-CM

## 2012-08-29 DIAGNOSIS — L02214 Cutaneous abscess of groin: Secondary | ICD-10-CM

## 2012-08-29 DIAGNOSIS — F329 Major depressive disorder, single episode, unspecified: Secondary | ICD-10-CM

## 2012-08-29 DIAGNOSIS — E119 Type 2 diabetes mellitus without complications: Secondary | ICD-10-CM

## 2012-08-29 LAB — CBC WITH DIFFERENTIAL/PLATELET
Basophils Absolute: 0 10*3/uL (ref 0.0–0.1)
Eosinophils Absolute: 0.1 10*3/uL (ref 0.0–0.7)
Lymphocytes Relative: 17 % (ref 12–46)
Lymphs Abs: 1 10*3/uL (ref 0.7–4.0)
MCH: 28.7 pg (ref 26.0–34.0)
Neutrophils Relative %: 69 % (ref 43–77)
Platelets: 240 10*3/uL (ref 150–400)
RBC: 4.98 MIL/uL (ref 4.22–5.81)
RDW: 15.4 % (ref 11.5–15.5)
WBC: 6.1 10*3/uL (ref 4.0–10.5)

## 2012-08-29 MED ORDER — RIVAROXABAN 20 MG PO TABS: 20.0000 mg | ORAL_TABLET | Freq: Every day | ORAL | Status: DC

## 2012-08-29 MED ORDER — FUROSEMIDE 20 MG PO TABS: 20.0000 mg | ORAL_TABLET | Freq: Every day | ORAL | Status: DC

## 2012-08-29 MED ORDER — LANSOPRAZOLE 30 MG PO CPDR: 30.0000 mg | DELAYED_RELEASE_CAPSULE | Freq: Every day | ORAL | Status: DC

## 2012-08-29 MED ORDER — POTASSIUM CHLORIDE 20 MEQ/15ML (10%) PO LIQD: 20.0000 meq | Freq: Every day | ORAL | Status: DC

## 2012-08-29 MED ORDER — BACLOFEN 10 MG PO TABS: 10.0000 mg | ORAL_TABLET | Freq: Three times a day (TID) | ORAL | Status: DC

## 2012-08-29 NOTE — Progress Notes (Signed)
Subjective:    Patient ID: Robert Berger, male    DOB: March 29, 1941, 72 y.o.   MRN: [id number]  HPI  Robert Berger "Robert Berger"  is a 72 yr old male referred by Dr. Myna Hidalgo today to establish care. He has multiple medical issues. He has not had a regular primary care provider.  Patient was diagnosed with MM in July 2013.  He then had KP of his back.  This was followed by rehab at [facility].  Returned home while undergoing chemo.  Reports that one morning he woke up acutely short of breath.  He was told that he had a PE/DVT. The patient had a complicated hospitalization in October.  Records are reviewed. In short he was diagnosed with diverticulitis.  This was complicated by perforation requiring colostomy 10/5, respiratory failure, DVT/PE and he was ultimately sent to a skilled facility at time of discharge. [facility]. He has been home now since 1-2 weeks before christmas.  He was discharged home with home health PT. 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    Multiple Myeloma- recent consultation note from Dr. Myna Hidalgo indicates that this is now starting to "come back." He plans to treat him with Velcade every 2 weeks.  This was started last Wednesday.   Left inguinal abscess- saw surgery last week and had this drained.  He was placed on doxycycline for this.  Reports that this is improving with doxycycline.    PE/DVT- Diagnosed 10/13. this was felt to be due to prolonged immobility.  He is maintained on xarelto for this.  He also has an ivc filter placed which was removed prior to his visit to [facility].    HTN-He is currently on cardizem CD.    CAD- pt denies known hx of CAD.   Depression- Pt reports once bad depressive episode x 2-3 days after he went to [place]. He reports that he felt this was side effect of med he was placed on  empirically for depression by hospital MD.    Pt reports that he was given steroids to "aid in the healing."  Reports that he has been doing blood sugar tests at home 3x a week. Generally mid 90's to 70's.    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  Review of Systems  Constitutional: Positive for fatigue and unexpected weight change.       Reports that his weight last summer was 255  HENT: Negative for hearing loss.   Eyes: Negative for visual disturbance.  Respiratory: Negative for chest tightness.   Cardiovascular: Negative for leg swelling.  Gastrointestinal: Positive for constipation. Negative for nausea.  Genitourinary: Negative for dysuria and frequency.  Musculoskeletal: Negative for myalgias.       Pt reports +chronic low level back pain.    Skin: Positive for wound.       Inguinal wound  Neurological: Negative for headaches.       Some dizziness with standing  Hematological: Negative for adenopathy.  Psychiatric/Behavioral:       Denies current issue with depression       Objective:   Physical Exam  Constitutional: He is oriented to person, place, and time.  Elderly white male laying flat on exam table, appears tired, but in NAD.  HENT:  Head: Normocephalic and atraumatic.  Mouth/Throat: No oropharyngeal exudate.  Eyes: No scleral icterus.  Cardiovascular: Normal rate and regular rhythm.   No murmur heard. Pulmonary/Chest: Effort normal and breath sounds normal. No respiratory distress. He has no wheezes. He has no rales. He exhibits no tenderness.  Abdominal: Soft. He exhibits no distension.  Neurological: He is alert and oriented to person, place, and time.  Skin: Skin is warm and dry.       Small healing abscess left groin without significant erythema/induration.   Psychiatric: He has a normal mood and affect. His behavior is  normal. Thought content normal.          Assessment & Plan:

## 2012-08-29 NOTE — Patient Instructions (Addendum)
Please complete your blood work prior to leaving.  Follow up in 6 weeks, sooner if problems or concerns.  Welcome to [facility]!

## 2012-08-30 ENCOUNTER — Telehealth: Payer: Self-pay | Admitting: Family

## 2012-08-30 LAB — MICROALBUMIN / CREATININE URINE RATIO
Creatinine, Urine: 200.8 mg/dL
Microalb, Ur: 3.94 mg/dL — ABNORMAL HIGH (ref 0.00–1.89)

## 2012-08-30 LAB — TSH: TSH: 0.887 u[IU]/mL (ref 0.350–4.500)

## 2012-08-30 LAB — BASIC METABOLIC PANEL
BUN: 7 mg/dL (ref 6–23)
Creat: 0.61 mg/dL (ref 0.50–1.35)

## 2012-08-30 LAB — HEMOGLOBIN A1C: Hgb A1c MFr Bld: 6 % — ABNORMAL HIGH (ref <5.7)

## 2012-08-30 MED ORDER — LISINOPRIL 2.5 MG PO TABS: 2.5000 mg | ORAL_TABLET | Freq: Every day | ORAL | Status: DC

## 2012-08-30 NOTE — Telephone Encounter (Signed)
Reviewed lab work.  Diabetes appears well controlled. Thyroid, blood count and kidney function are all good.  Some protein in urine.  This is likely related to the diabetes. I would recommend that he start lisinopril 2.5 mg once daily and follow up in 1 month for BP check and bmet.

## 2012-08-30 NOTE — Telephone Encounter (Signed)
Notified pt and he is agreeable to proceed with medication. Follow up scheduled for 10/02/12 at 2:30pm.

## 2012-09-04 NOTE — Assessment & Plan Note (Signed)
Clinically improving with doxycycline, continue doxy, keep follow up with surgery.

## 2012-09-04 NOTE — Assessment & Plan Note (Signed)
Stable on Cardizem CD.

## 2012-09-04 NOTE — Assessment & Plan Note (Signed)
Currently undergoing chemo per Dr. Myna Hidalgo.  Defer management to oncology.

## 2012-09-04 NOTE — Assessment & Plan Note (Signed)
Diagnosed 10/14.  Will need to be on xarelto for at least six months- (through 3/14).

## 2012-09-04 NOTE — Assessment & Plan Note (Signed)
He feels that this is currently well controlled off of meds.  Monitor.

## 2012-09-06 ENCOUNTER — Ambulatory Visit (HOSPITAL_BASED_OUTPATIENT_CLINIC_OR_DEPARTMENT_OTHER): Payer: BC Managed Care – PPO | Admitting: Medical

## 2012-09-06 ENCOUNTER — Other Ambulatory Visit (HOSPITAL_BASED_OUTPATIENT_CLINIC_OR_DEPARTMENT_OTHER): Payer: BC Managed Care – PPO | Admitting: Lab

## 2012-09-06 ENCOUNTER — Ambulatory Visit (HOSPITAL_BASED_OUTPATIENT_CLINIC_OR_DEPARTMENT_OTHER): Payer: BC Managed Care – PPO

## 2012-09-06 VITALS — BP 110/78 | HR 76 | Temp 98.0°F | Resp 18 | Ht 68.0 in | Wt 210.0 lb

## 2012-09-06 DIAGNOSIS — C9000 Multiple myeloma not having achieved remission: Secondary | ICD-10-CM

## 2012-09-06 DIAGNOSIS — Z86718 Personal history of other venous thrombosis and embolism: Secondary | ICD-10-CM

## 2012-09-06 DIAGNOSIS — Z86711 Personal history of pulmonary embolism: Secondary | ICD-10-CM

## 2012-09-06 DIAGNOSIS — Z5112 Encounter for antineoplastic immunotherapy: Secondary | ICD-10-CM

## 2012-09-06 LAB — CBC WITH DIFFERENTIAL (CANCER CENTER ONLY)
BASO%: 0.4 % (ref 0.0–2.0)
LYMPH%: 20.9 % (ref 14.0–48.0)
MCV: 87 fL (ref 82–98)
MONO#: 0.5 10*3/uL (ref 0.1–0.9)
NEUT#: 3.3 10*3/uL (ref 1.5–6.5)
Platelets: 193 10*3/uL (ref 145–400)
RDW: 15 % (ref 11.1–15.7)
WBC: 4.9 10*3/uL (ref 4.0–10.0)

## 2012-09-06 LAB — BASIC METABOLIC PANEL - CANCER CENTER ONLY
CO2: 27 mEq/L (ref 18–33)
Calcium: 9.3 mg/dL (ref 8.0–10.3)
Sodium: 140 mEq/L (ref 128–145)

## 2012-09-06 MED ORDER — BORTEZOMIB CHEMO SQ INJECTION 3.5 MG (2.5MG/ML)
1.3000 mg/m2 | Freq: Once | INTRAMUSCULAR | Status: AC
  Administered 2012-09-06: 3 mg via SUBCUTANEOUS
  Filled 2012-09-06: qty 3

## 2012-09-06 MED ORDER — ONDANSETRON HCL 8 MG PO TABS
8.0000 mg | ORAL_TABLET | Freq: Once | ORAL | Status: AC
  Administered 2012-09-06: 8 mg via ORAL

## 2012-09-06 NOTE — Progress Notes (Signed)
.  Pulmonary embolism/deep venous thrombosis.  Current therapy: #1.  Velcade- every 2 weeks. #2.  Zometa 4 mg IV every month. #3 .Xarelto 20 mg by mouth daily.  Interim history: [name] presents today for an office followup visit.  Recently started him back on Velcade given every 2 weeks.  Unfortunately, his myeloma was starting to come back.  His monoclonal spike was up to 0.45 g/dL.  His IgG level was up to 917 mg/dL, kappa light chain was 1.52 mg/dL. .  Robert Berger has really hung in there, given the fact that he developed a pulmonary embolism and DVT, secondary to immobilization.  He then developed a perforated colon, which required surgery.  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  When he was seen a few weeks ago, he did develop a fairly large abscess on his left inner thigh.  He was referred to Dr. Abbey Chatters who instead of doing a sharp I&D secondary to Mr. Janusz being on Xarelto,, he dilated the track, and drained, more for contents.  He was given doxycycline for 10 days.  his abscess has now resolved.  He does report some fatigue, with the, Velcade.  He, reports, that on an intermittent basis.  He receives physical therapy.  He, reports, that his insurance won't cover 30 visits of physical therapy, and he is currently at 14 or 15 visit.  He still continues to receive Zometa without any problem.  He has a good appetite.  He denies any nausea, vomiting.  He denies any fevers, chills, or night sweats, any short Ms. of breath, chest pain, or cough.  He denies any headaches, visual changes, or rashes.  He denies any obvious, or abnormal bleeding.  He denies any lower leg swelling.  Overall, his performance status is ECOG 2-3  Review of Systems: Constitutional:Negative for malaise/fatigue, fever, chills, weight loss, diaphoresis, activity change, appetite change, and unexpected weight change.  HEENT: Negative for double vision, blurred vision, visual loss, ear pain, tinnitus, congestion, rhinorrhea,  epistaxis sore throat or sinus disease, oral pain/lesion, tongue soreness Respiratory: Negative for cough, chest tightness, shortness of breath, wheezing and stridor.  Cardiovascular: Negative for chest pain, palpitations, leg swelling, orthopnea, PND, DOE or claudication Gastrointestinal: Negative for nausea, vomiting, abdominal pain, diarrhea, constipation, blood in stool, melena, hematochezia, abdominal distention, anal bleeding, rectal pain, anorexia and hematemesis.  Genitourinary: Negative for dysuria, frequency, hematuria,  Musculoskeletal: Negative for myalgias, back pain, joint swelling, arthralgias and gait problem.  Skin: Negative for rash, color change, pallor and wound.  Neurological:. Negative for dizziness/light-headedness, tremors, seizures, syncope, facial asymmetry, speech difficulty, weakness, numbness, headaches and paresthesias.  Hematological: Negative for adenopathy. Does not bruise/bleed easily.  Psychiatric/Behavioral:  Negative for depression, no loss of interest in normal activity or change in sleep pattern.   Physical Exam: This is a pleasant, 72 year old, elderly, appearing, white gentleman, in no obvious distress Vitals: Temperature 98.0 degrees, pulse 96, respirations 18, blood pressure 110/78, weight 210 pounds HEENT reveals a normocephalic, atraumatic skull, no scleral icterus, no oral lesions  Neck is supple without any cervical or supraclavicular adenopathy.  Lungs are clear to auscultation bilaterally. There are no wheezes, rales or rhonci Cardiac is regular rate and rhythm with a normal S1 and S2. There are no murmurs, rubs, or bruits.  Abdomen is soft with good bowel sounds, there is no palpable mass. There is no palpable hepatosplenomegaly. There is no palpable fluid wave.  Musculoskeletal no tenderness of the spine, ribs, or hips.  Extremities there are no  clubbing, cyanosis, or edema.  Skin no petechia, purpura or ecchymosis Neurologic is  nonfocal.  Laboratory Data: White count 4.9, hemoglobin 14.4, hematocrit 44.0, platelets 193,000  Current Outpatient Prescriptions on File Prior to Visit  Medication Sig Dispense Refill  . baclofen (LIORESAL) 10 MG tablet Take 1 tablet (10 mg total) by mouth 3 (three) times daily.  90 each  3  . diltiazem (CARDIZEM CD) 120 MG 24 hr capsule Take 1 capsule (120 mg total) by mouth daily.  30 capsule  0  . doxycycline (VIBRA-TABS) 100 MG tablet Take 1 tablet (100 mg total) by mouth 2 (two) times daily.  28 tablet  0  . furosemide (LASIX) 20 MG tablet Take 1 tablet (20 mg total) by mouth daily.  30 tablet  3  . lansoprazole (PREVACID) 30 MG capsule Take 1 capsule (30 mg total) by mouth daily.  30 capsule  3  . lisinopril (PRINIVIL,ZESTRIL) 2.5 MG tablet Take 1 tablet (2.5 mg total) by mouth daily.  30 tablet  2  . methocarbamol (ROBAXIN) 500 MG tablet Take 500 mg by mouth 4 (four) times daily.      Marland Kitchen oxyCODONE (OXY IR/ROXICODONE) 5 MG immediate release tablet Take 1 tablet (5 mg total) by mouth every 4 (four) hours as needed. Breakthrough pain  65 tablet  0  . polyethylene glycol powder (MIRALAX) powder Take 17 g by mouth daily.      . potassium chloride 20 MEQ/15ML (10%) solution Take 15 mLs (20 mEq total) by mouth daily.  500 mL  3  . Rivaroxaban (XARELTO) 20 MG TABS Take 1 tablet (20 mg total) by mouth daily.  30 tablet  3  . sodium chloride (DEEP SEA NASAL SPRAY) 0.65 % nasal spray Place into the nose as needed.      . traMADol (ULTRAM) 50 MG tablet Take 2 tablets (100 mg total) by mouth 2 (two) times daily.  120 tablet  1  . zolpidem (AMBIEN) 5 MG tablet Take 5 mg by mouth as needed. sleep       Assessment/Plan: This is a pleasant, 72 year old, white gentleman, with the following issues:  #1.  IgG kappa myeloma.   He is on Velcade every 2 weeks, and tolerating it quite well.  He does report more fatigue, however, no other toxicity.  We did have to get him back on Velcade as his M spike was  progressing upwards.  He had a fabulous response to Velcade when we started him on this last year.  We will have to monitor his fatigue, and weakness since we do not want to compromise his quality of life.    #2.  Supportive therapy.  He will continue with Zometa monthly 4mg .   #3. Followup.  Mr. Bunner will follow back up with [place] on 09/20/2012, but before then should there be questions or concerns.

## 2012-09-06 NOTE — Patient Instructions (Signed)
Carfilzomib injection What is this medicine? CARFILZOMIB is a chemotherapy drug that works by slowing or stopping cancer cell growth. This medicine is used to treat multiple myeloma. This medicine may be used for other purposes; ask your health care provider or pharmacist if you have questions. What should I tell my health care provider before I take this medicine? They need to know if you have any of these conditions: -heart disease -irregular heartbeat -liver disease -lung or breathing disease -an unusual or allergic reaction to carfilzomib, or other medicines, foods, dyes, or preservatives -pregnant or trying to get pregnant -breast-feeding How should I use this medicine? This medicine is for injection or infusion into a vein. It is given by a health care professional in a hospital or clinic setting. Talk to your pediatrician regarding the use of this medicine in children. Special care may be needed. Overdosage: If you think you've taken too much of this medicine contact a poison control center or emergency room at once. Overdosage: If you think you have taken too much of this medicine contact a poison control center or emergency room at once. NOTE: This medicine is only for you. Do not share this medicine with others. What if I miss a dose? It is important not to miss your dose. Call your doctor or health care professional if you are unable to keep an appointment. What may interact with this medicine? Interactions are not expected. Give your health care provider a list of all the medicines, herbs, non-prescription drugs, or dietary supplements you use. Also tell them if you smoke, drink alcohol, or use illegal drugs. Some items may interact with your medicine. This list may not describe all possible interactions. Give your health care provider a list of all the medicines, herbs, non-prescription drugs, or dietary supplements you use. Also tell them if you smoke, drink alcohol, or use  illegal drugs. Some items may interact with your medicine. What should I watch for while using this medicine? Your condition will be monitored carefully while you are receiving this medicine. Report any side effects. Continue your course of treatment even though you feel ill unless your doctor tells you to stop. Call your doctor or health care professional for advice if you get a fever, chills or sore throat, or other symptoms of a cold or flu. Do not treat yourself. Try to avoid being around people who are sick. Do not become pregnant while taking this medicine. Women should inform their doctor if they wish to become pregnant or think they might be pregnant. There is a potential for serious side effects to an unborn child. Talk to your health care professional or pharmacist for more information. Do not breast-feed an infant while taking this medicine. Check with your doctor or health care professional if you get an attack of severe diarrhea, nausea and vomiting, or if you sweat a lot. The loss of too much body fluid can make it dangerous for you to take this medicine. You may get dizzy. Do not drive, use machinery, or do anything that needs mental alertness until you know how this medicine affects you. Do not stand or sit up quickly, especially if you are an older patient. This reduces the risk of dizzy or fainting spells. What side effects may I notice from receiving this medicine? Side effects that you should report to your doctor or health care professional as soon as possible: -allergic reactions like skin rash, itching or hives, swelling of the face, lips, or tongue -breathing   problems -chest pain or palpitationschest tightness -cough -dark urine -dizziness -feeling faint or lightheaded -fever or chills -general ill feeling or flu-like symptoms -light-colored stools -palpitations -right upper belly pain -swelling of the legs or ankles -unusual bleeding or bruising -unusually weak or  tired -yellowing of the eyes or skin  Side effects that usually do not require medical attention (Report these to your doctor or health care professional if they continue or are bothersome.): -diarrhea -headache -nausea, vomiting -tiredness This list may not describe all possible side effects. Call your doctor for medical advice about side effects. You may report side effects to FDA at 1-800-FDA-1088. Where should I keep my medicine? This drug is given in a hospital or clinic and will not be stored at home. NOTE: This sheet is a summary. It may not cover all possible information. If you have questions about this medicine, talk to your doctor, pharmacist, or health care provider.  2013, [employer]. (07/15/2011 4:16:30 PM)  

## 2012-09-07 ENCOUNTER — Telehealth: Payer: Self-pay | Admitting: *Deleted

## 2012-09-07 NOTE — Telephone Encounter (Signed)
Received group long term disability papers from [employer] Form forwarded to Provider for completion.

## 2012-09-07 NOTE — Telephone Encounter (Signed)
Pt requests that papers be mailed to his home when completed.

## 2012-09-14 NOTE — Telephone Encounter (Signed)
Completed.

## 2012-09-15 ENCOUNTER — Telehealth: Payer: Self-pay | Admitting: Family

## 2012-09-15 NOTE — Telephone Encounter (Signed)
Notified pt of completion and he requests that paper be mailed to his home. Copy sent for scanning and original mailed.

## 2012-09-15 NOTE — Telephone Encounter (Signed)
Dropped off forms for disability and needs to know when he can pick them up

## 2012-09-15 NOTE — Telephone Encounter (Signed)
See previous phone note.  

## 2012-09-15 NOTE — Telephone Encounter (Signed)
Please advise 

## 2012-09-20 ENCOUNTER — Ambulatory Visit: Payer: Medicare Other

## 2012-09-20 ENCOUNTER — Ambulatory Visit (HOSPITAL_BASED_OUTPATIENT_CLINIC_OR_DEPARTMENT_OTHER): Payer: BC Managed Care – PPO | Admitting: Hematology & Oncology

## 2012-09-20 ENCOUNTER — Other Ambulatory Visit (HOSPITAL_BASED_OUTPATIENT_CLINIC_OR_DEPARTMENT_OTHER): Payer: BC Managed Care – PPO | Admitting: Lab

## 2012-09-20 VITALS — BP 127/92 | HR 101 | Temp 97.8°F | Resp 18 | Ht 68.0 in | Wt 213.0 lb

## 2012-09-20 DIAGNOSIS — I82409 Acute embolism and thrombosis of unspecified deep veins of unspecified lower extremity: Secondary | ICD-10-CM

## 2012-09-20 DIAGNOSIS — C9000 Multiple myeloma not having achieved remission: Secondary | ICD-10-CM

## 2012-09-20 DIAGNOSIS — Z5112 Encounter for antineoplastic immunotherapy: Secondary | ICD-10-CM

## 2012-09-20 LAB — CBC WITH DIFFERENTIAL (CANCER CENTER ONLY)
BASO%: 0.2 % (ref 0.0–2.0)
Eosinophils Absolute: 0.1 10*3/uL (ref 0.0–0.5)
LYMPH#: 0.9 10*3/uL (ref 0.9–3.3)
LYMPH%: 17.1 % (ref 14.0–48.0)
MCV: 87 fL (ref 82–98)
MONO#: 0.6 10*3/uL (ref 0.1–0.9)
NEUT#: 3.5 10*3/uL (ref 1.5–6.5)
Platelets: 184 10*3/uL (ref 145–400)
RBC: 4.99 10*6/uL (ref 4.20–5.70)
RDW: 15.5 % (ref 11.1–15.7)
WBC: 5 10*3/uL (ref 4.0–10.0)

## 2012-09-20 MED ORDER — ONDANSETRON HCL 8 MG PO TABS
8.0000 mg | ORAL_TABLET | Freq: Once | ORAL | Status: AC
  Administered 2012-09-20: 8 mg via ORAL

## 2012-09-20 MED ORDER — PROCHLORPERAZINE MALEATE 10 MG PO TABS: 10.0000 mg | ORAL_TABLET | Freq: Four times a day (QID) | ORAL | Status: DC | PRN

## 2012-09-20 MED ORDER — BORTEZOMIB CHEMO SQ INJECTION 3.5 MG (2.5MG/ML)
1.3000 mg/m2 | Freq: Once | INTRAMUSCULAR | Status: AC
  Administered 2012-09-20: 3 mg via SUBCUTANEOUS
  Filled 2012-09-20: qty 3

## 2012-09-20 NOTE — Progress Notes (Signed)
This office note has been dictated.
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
## 2012-09-22 ENCOUNTER — Telehealth: Payer: Self-pay | Admitting: *Deleted

## 2012-09-22 DIAGNOSIS — C9000 Multiple myeloma not having achieved remission: Secondary | ICD-10-CM

## 2012-09-22 MED ORDER — ONDANSETRON 4 MG PO FILM: ORAL_FILM | ORAL | Status: DC

## 2012-09-22 NOTE — Telephone Encounter (Signed)
VERBAL ORDER AND READ BACK TO DR.ENNEVER- MAY CALL PRESCRIPTION TO PT.'S PHARMACY. SEE MEDICATION LIST.

## 2012-09-23 ENCOUNTER — Other Ambulatory Visit: Payer: Self-pay | Admitting: Hematology & Oncology

## 2012-09-25 ENCOUNTER — Telehealth (INDEPENDENT_AMBULATORY_CARE_PROVIDER_SITE_OTHER): Payer: Self-pay | Admitting: *Deleted

## 2012-09-25 ENCOUNTER — Ambulatory Visit (HOSPITAL_BASED_OUTPATIENT_CLINIC_OR_DEPARTMENT_OTHER): Payer: BC Managed Care – PPO | Admitting: Lab

## 2012-09-25 ENCOUNTER — Telehealth: Payer: Self-pay | Admitting: *Deleted

## 2012-09-25 ENCOUNTER — Other Ambulatory Visit: Payer: Self-pay | Admitting: Hematology & Oncology

## 2012-09-25 ENCOUNTER — Ambulatory Visit (HOSPITAL_BASED_OUTPATIENT_CLINIC_OR_DEPARTMENT_OTHER): Payer: BC Managed Care – PPO | Admitting: Hematology & Oncology

## 2012-09-25 ENCOUNTER — Ambulatory Visit (HOSPITAL_BASED_OUTPATIENT_CLINIC_OR_DEPARTMENT_OTHER)
Admission: RE | Admit: 2012-09-25 | Discharge: 2012-09-25 | Disposition: A | Discharge status: Home / Self Care | Payer: BC Managed Care – PPO | Source: Ambulatory Visit | Attending: Hematology & Oncology | Admitting: Hematology & Oncology

## 2012-09-25 ENCOUNTER — Telehealth: Payer: Self-pay | Admitting: Hematology & Oncology

## 2012-09-25 ENCOUNTER — Other Ambulatory Visit: Payer: Self-pay | Admitting: *Deleted

## 2012-09-25 DIAGNOSIS — R5383 Other fatigue: Secondary | ICD-10-CM

## 2012-09-25 DIAGNOSIS — R197 Diarrhea, unspecified: Secondary | ICD-10-CM

## 2012-09-25 DIAGNOSIS — C9000 Multiple myeloma not having achieved remission: Secondary | ICD-10-CM

## 2012-09-25 DIAGNOSIS — I10 Essential (primary) hypertension: Secondary | ICD-10-CM

## 2012-09-25 DIAGNOSIS — Z933 Colostomy status: Secondary | ICD-10-CM

## 2012-09-25 DIAGNOSIS — Z86718 Personal history of other venous thrombosis and embolism: Secondary | ICD-10-CM

## 2012-09-25 LAB — BASIC METABOLIC PANEL - CANCER CENTER ONLY
BUN, Bld: 11 mg/dL (ref 7–22)
CO2: 26 mEq/L (ref 18–33)
Chloride: 103 mEq/L (ref 98–108)
Glucose, Bld: 110 mg/dL (ref 73–118)
Potassium: 3.9 mEq/L (ref 3.3–4.7)

## 2012-09-25 LAB — IGG, IGA, IGM
IgA: 56 mg/dL — ABNORMAL LOW (ref 68–379)
IgG (Immunoglobin G), Serum: 1120 mg/dL (ref 650–1600)

## 2012-09-25 LAB — COMPREHENSIVE METABOLIC PANEL
ALT: 31 U/L (ref 0–53)
Albumin: 3.9 g/dL (ref 3.5–5.2)
CO2: 26 mEq/L (ref 19–32)
Glucose, Bld: 99 mg/dL (ref 70–99)
Potassium: 4.3 mEq/L (ref 3.5–5.3)
Sodium: 137 mEq/L (ref 135–145)
Total Bilirubin: 0.7 mg/dL (ref 0.3–1.2)
Total Protein: 6.1 g/dL (ref 6.0–8.3)

## 2012-09-25 LAB — CBC WITH DIFFERENTIAL (CANCER CENTER ONLY)
BASO#: 0 10*3/uL (ref 0.0–0.2)
Eosinophils Absolute: 0 10*3/uL (ref 0.0–0.5)
HGB: 15.1 g/dL (ref 13.0–17.1)
LYMPH#: 0.8 10*3/uL — ABNORMAL LOW (ref 0.9–3.3)
MCH: 28.4 pg (ref 28.0–33.4)
MONO#: 0.5 10*3/uL (ref 0.1–0.9)
MONO%: 11.7 % (ref 0.0–13.0)
NEUT#: 3.2 10*3/uL (ref 1.5–6.5)
RBC: 5.31 10*6/uL (ref 4.20–5.70)
WBC: 4.5 10*3/uL (ref 4.0–10.0)

## 2012-09-25 LAB — KAPPA/LAMBDA LIGHT CHAINS
Kappa free light chain: 0.7 mg/dL (ref 0.33–1.94)
Lambda Free Lght Chn: 1.13 mg/dL (ref 0.57–2.63)

## 2012-09-25 MED ORDER — DILTIAZEM HCL ER COATED BEADS 120 MG PO CP24: 120.0000 mg | ORAL_CAPSULE | Freq: Every day | ORAL | Status: DC

## 2012-09-25 NOTE — Progress Notes (Signed)
This office note has been dictated.

## 2012-09-25 NOTE — Telephone Encounter (Signed)
Pt aware of 2-26 appointment °

## 2012-09-25 NOTE — Telephone Encounter (Signed)
Pt's wife presented to the office requesting refill of pt's diltiazem ER. Refill sent to [facility], #30 x 2 refills and rx from 09/23/12 cancelled at [facility].

## 2012-09-25 NOTE — Telephone Encounter (Signed)
Robert Berger called to verify patient is still an active patient with this practice and will be faxing over authorization for ostomy supplies to [phone].

## 2012-09-25 NOTE — Progress Notes (Signed)
Pt was called to confirm appt for next week and while on the phone with scheduling it was reported that he was having problems with fatigue, poor appetite, diarrhea, and blood in his colostomy bag. Denies any previous issues with blood. Reviewed with Dr Myna Hidalgo. To have pt come in today to check labs then have abd x-ray.

## 2012-09-26 NOTE — Progress Notes (Signed)
DIAGNOSES: 1. IgG kappa myeloma. 2. Deep vein thrombosis/pulmonary embolism.  CURRENT THERAPY: 1. Velcade-patient to have this discontinued secondary to toxicity. 2. Zometa 4 mg IV monthly-patient will have this on hold. 3. Xarelto 20 mg p.o. daily. 4.  INTERIM HISTORY:  Robert Berger comes in for an unscheduled visit.  He says that he has not felt well since last dose of Velcade.  He does seem to have trouble with Velcade.  We tried him on every 2 week dosing to see if this would not give him better tolerance.  Unfortunately, this really has not helped.  He is having a lot of liquid stool through his colostomy.  He says some of this is bloody.  Apparently, he just feels very tired.  He is not eating much.  He has no energy.  Clearly, his quality of life has been compromised.  His last myeloma studies did not show a monoclonal spike in his serum. His IgG level was 1120 mg/dL.  Kappa light chain was 0.7 mg/dL.  I think we can hold on the chemotherapy for right now.  He just is not going to tolerate Velcade.  I think that if we have got to restart him on treatment, I would probably consider Revlimid.  I realize that he does have the history of pulmonary embolism and DVT.  He is on the Xarelto, however.  I think quality of life is going to be a big issue with Robert Berger.  I want to see him more active.  I want to see him eat better.  PHYSICAL EXAMINATION:  General:  This is a somewhat frail-appearing white gentleman in no obvious distress.  Vital signs:  Temperature of 97.8, pulse 82, respiratory rate 18, blood pressure 112/80.  Head and neck:  Normocephalic, atraumatic skull.  There are no ocular or oral lesions.  There is no adenopathy in the neck.  Lungs:  Clear bilaterally.  Cardiac:  Regular rate and rhythm with a normal S1 and S2. There are no murmurs, rubs, or bruits.  Abdomen:  Soft with good bowel sounds.  There is no palpable abdominal mass.  There is no fluid  wave. There is no palpable hepatosplenomegaly.  His colostomy site is intact. Extremities:  No clubbing, cyanosis, or edema.  Skin:  No rashes.  LABORATORY STUDIES:  White cell count is 4.5, hemoglobin 15.1, hematocrit 44.8, platelet count 148.  Sodium 142, potassium 3.9, calcium 9.4.  Creatinine is 0.6.  IMPRESSION:  Robert Berger is a 72 year old gentleman with IgG kappa myeloma.  He responds very well to chemotherapy, although his tolerance to chemo is limited.  I really think that we need to give him a break right now.  I want to see how long we can go without having to treat him.  I think, again, his quality of life is what is important.  We will continue to follow him closely.  I still do want to hold his Zometa.  We will have him come back next week to see how he is doing.  I think if all is looking better next week, then maybe we can get him back in 4-6 weeks.    ______________________________ [name], M.D. PRE/MEDQ  D:  09/25/2012  T:  09/26/2012  Job:  [id number]

## 2012-10-02 ENCOUNTER — Ambulatory Visit (INDEPENDENT_AMBULATORY_CARE_PROVIDER_SITE_OTHER): Payer: BC Managed Care – PPO | Admitting: Family

## 2012-10-02 ENCOUNTER — Encounter: Payer: Self-pay | Admitting: Family

## 2012-10-02 VITALS — BP 100/70 | HR 108 | Temp 98.4°F | Resp 18 | Wt 209.1 lb

## 2012-10-02 DIAGNOSIS — I10 Essential (primary) hypertension: Secondary | ICD-10-CM

## 2012-10-02 DIAGNOSIS — E119 Type 2 diabetes mellitus without complications: Secondary | ICD-10-CM

## 2012-10-02 DIAGNOSIS — C9000 Multiple myeloma not having achieved remission: Secondary | ICD-10-CM

## 2012-10-02 DIAGNOSIS — K5900 Constipation, unspecified: Secondary | ICD-10-CM

## 2012-10-02 NOTE — Patient Instructions (Addendum)
Please follow up in 2 months. 

## 2012-10-02 NOTE — Assessment & Plan Note (Addendum)
BP Readings from Last 3 Encounters:  10/02/12 100/70  09/20/12 127/92  09/06/12 110/78   BP looks good.  Monitor on cardizem and lisinopril.

## 2012-10-02 NOTE — Assessment & Plan Note (Signed)
Recommended trial of miralax.

## 2012-10-02 NOTE — Progress Notes (Signed)
Subjective:    Patient ID: Robert Berger, male    DOB: 1941-05-21, 72 y.o.   MRN: [id number]  HPI  Robert Berger is a 72 yr old male who presents today for follow up of his multiple medical problems.  1) IgG Kappa Myeloma- pt continues to follow with Dr. Myna Hidalgo.  He reports constipation x 2 days following his chemo treatment. Had diarrhea/nausea  Following the chemo.  Took imodium due to diarrhea.  No output from colostomy for 2 days.    2) HTN- He continues lisinopril, diltiazem.  3) Depression-  Pt is not currently on medication for this.  Feels that he is doing well overall.    4) DM2- Last A1C 6.0. This is currently diet controlled. Last eye exam was a "long time ago."  Review of Systems    see HPI  Past Medical History  Diagnosis Date  . Arthritis   . GERD (gastroesophageal reflux disease)   . Complication of anesthesia     aspiration during surgery, difficult to wake up  . Multiple myeloma   . Status post radiation therapy 03/23/12 - 04/05/12    T8 - L4/ 20 Gy/ 10 Fractions  . Dysphagia 05/06/12    ED Visit    History   Social History  . Marital Status: Significant Other    Spouse Name: N/A    Number of Children: 3  . Years of Education: 14   Occupational History  . Games developer, on medical leave    Social History Main Topics  . Smoking status: Former Smoker    Types: Cigarettes    Quit date: 03/09/1978  . Smokeless tobacco: Never Used  . Alcohol Use: Yes     Comment: 1 glass of wine or a burbon daily., none in the last 3 months  . Drug Use: No  . Sexually Active: No   Other Topics Concern  . Not on file   Social History Narrative   Divorced x 2, lives with male partner (x 20 years). Normally able to ambulate without assistance.   2 children- both daughters who live in [place]   Completed some college   Enjoys computers, camping trailer.      Past Surgical History  Procedure Laterality Date  . Fracture surgery      ankle  . Hemorrhoid  surgery    . Back surgery      47 years ago  . Laparotomy  05/14/2012    Procedure: EXPLORATORY LAPAROTOMY;  Surgeon: Velora Heckler, MD;  Location: [facility];  Service: General;  Laterality: N/A;  . Colostomy  05/14/2012    Procedure: COLOSTOMY;  Surgeon: Velora Heckler, MD;  Location: [facility];  Service: General;  Laterality: N/A;    Family History  Problem Relation Age of Onset  . Cancer Maternal Grandfather   . Cancer Maternal Grandmother     Allergies  Allergen Reactions  . Penicillins Swelling    Current Outpatient Prescriptions on File Prior to Visit  Medication Sig Dispense Refill  . baclofen (LIORESAL) 10 MG tablet Take 1 tablet (10 mg total) by mouth 3 (three) times daily.  90 each  3  . diltiazem (CARDIZEM CD) 120 MG 24 hr capsule Take 1 capsule (120 mg total) by mouth daily.  30 capsule  2  . doxycycline (VIBRA-TABS) 100 MG tablet Take 1 tablet (100 mg total) by mouth 2 (two) times daily.  28 tablet  0  . lansoprazole (PREVACID) 30 MG capsule Take 1  capsule (30 mg total) by mouth daily.  30 capsule  3  . lisinopril (PRINIVIL,ZESTRIL) 2.5 MG tablet Take 1 tablet (2.5 mg total) by mouth daily.  30 tablet  2  . methocarbamol (ROBAXIN) 500 MG tablet Take 500 mg by mouth as needed.       . Ondansetron 4 MG FILM PLACE ONE STRIP ON TONGUE EVERY 12 HOURS PRN.  20 each  4  . oxyCODONE (OXY IR/ROXICODONE) 5 MG immediate release tablet Take 1 tablet (5 mg total) by mouth every 4 (four) hours as needed. Breakthrough pain  65 tablet  0  . polyethylene glycol powder (MIRALAX) powder Take 17 g by mouth as needed.       . prochlorperazine (COMPAZINE) 10 MG tablet Take 1 tablet (10 mg total) by mouth every 6 (six) hours as needed.  60 tablet  6  . Rivaroxaban (XARELTO) 20 MG TABS Take 1 tablet (20 mg total) by mouth daily.  30 tablet  3  . sodium chloride (DEEP SEA NASAL SPRAY) 0.65 % nasal spray Place into the nose as needed.      . traMADol (ULTRAM) 50 MG tablet Take 2 tablets (100 mg total) by  mouth 2 (two) times daily.  120 tablet  1  . zolpidem (AMBIEN) 5 MG tablet Take 5 mg by mouth as needed. sleep       No current facility-administered medications on file prior to visit.    BP 100/70  Pulse 108  Temp(Src) 98.4 F (36.9 C) (Oral)  Resp 18  Wt 209 lb 1.9 oz (94.856 kg)  BMI 31.8 kg/m2  SpO2 96%    Objective:   Physical Exam  Constitutional: He is oriented to person, place, and time.  Chronically ill appearing male seated in wheel chair.  HENT:  Head: Normocephalic and atraumatic.  Cardiovascular: Normal rate and regular rhythm.   No murmur heard. Pulmonary/Chest: Effort normal and breath sounds normal. No respiratory distress. He has no wheezes. He has no rales. He exhibits no tenderness.  Musculoskeletal: He exhibits no edema.  Lymphadenopathy:    He has no cervical adenopathy.  Neurological: He is alert and oriented to person, place, and time.  Skin: Skin is warm and dry.  Psychiatric: He has a normal mood and affect. His behavior is normal. Judgment and thought content normal.          Assessment & Plan:

## 2012-10-02 NOTE — Assessment & Plan Note (Signed)
He is going to take a break from chemo- this is being managed by Dr. Myna Hidalgo.

## 2012-10-02 NOTE — Assessment & Plan Note (Signed)
A1C 6.0- stable,controlled.

## 2012-10-04 ENCOUNTER — Ambulatory Visit: Payer: Medicare Other | Admitting: Hematology & Oncology

## 2012-10-04 ENCOUNTER — Inpatient Hospital Stay: Payer: Medicare Other

## 2012-10-04 ENCOUNTER — Ambulatory Visit (HOSPITAL_BASED_OUTPATIENT_CLINIC_OR_DEPARTMENT_OTHER): Payer: BC Managed Care – PPO | Admitting: Lab

## 2012-10-04 ENCOUNTER — Telehealth (INDEPENDENT_AMBULATORY_CARE_PROVIDER_SITE_OTHER): Payer: Self-pay

## 2012-10-04 ENCOUNTER — Inpatient Hospital Stay: Payer: Self-pay

## 2012-10-04 ENCOUNTER — Ambulatory Visit: Payer: Medicare Other

## 2012-10-04 ENCOUNTER — Other Ambulatory Visit: Payer: Medicare Other | Admitting: Lab

## 2012-10-04 ENCOUNTER — Ambulatory Visit (HOSPITAL_BASED_OUTPATIENT_CLINIC_OR_DEPARTMENT_OTHER): Payer: BC Managed Care – PPO | Admitting: Medical

## 2012-10-04 VITALS — BP 137/78 | HR 98 | Temp 97.8°F | Resp 18 | Ht 68.0 in | Wt 211.0 lb

## 2012-10-04 DIAGNOSIS — C9000 Multiple myeloma not having achieved remission: Secondary | ICD-10-CM

## 2012-10-04 LAB — CMP (CANCER CENTER ONLY)
ALT(SGPT): 21 U/L (ref 10–47)
AST: 23 U/L (ref 11–38)
Alkaline Phosphatase: 60 U/L (ref 26–84)
Creat: 0.7 mg/dl (ref 0.6–1.2)
Sodium: 142 mEq/L (ref 128–145)
Total Bilirubin: 0.6 mg/dl (ref 0.20–1.60)
Total Protein: 6.8 g/dL (ref 6.4–8.1)

## 2012-10-04 LAB — CBC WITH DIFFERENTIAL (CANCER CENTER ONLY)
BASO#: 0 10*3/uL (ref 0.0–0.2)
Eosinophils Absolute: 0.1 10*3/uL (ref 0.0–0.5)
HGB: 13.8 g/dL (ref 13.0–17.1)
MCH: 28.3 pg (ref 28.0–33.4)
MCV: 88 fL (ref 82–98)
MONO#: 0.6 10*3/uL (ref 0.1–0.9)
MONO%: 12.2 % (ref 0.0–13.0)
NEUT#: 3.1 10*3/uL (ref 1.5–6.5)
Platelets: 207 10*3/uL (ref 145–400)
RBC: 4.87 10*6/uL (ref 4.20–5.70)
WBC: 4.8 10*3/uL (ref 4.0–10.0)

## 2012-10-04 MED ORDER — OXYCODONE HCL 5 MG PO TABS: 5.0000 mg | ORAL_TABLET | ORAL | Status: DC | PRN

## 2012-10-04 NOTE — Progress Notes (Signed)
DIAGNOSES: 1. IgG kappa myeloma. 2. Deep vein thrombosis/pulmonary embolism.  CURRENT THERAPY: 1. Velcade-Currently discontinued secondary to toxicity. 2. Zometa 4 mg IV monthly-patient will have this on hold. 3. Xarelto 20 mg p.o. daily.   INTERIM HISTORY: Robert Berger presents today for an office followup visit.  His wife accompanies him.  Overall, he, reports, that he's feeling significantly better, since he's had a break from the, Velcade.  He still is having a difficult time evening out, his bowel movements from constipation to diarrhea.  I did advise him to get on a probiotic daily.  Hopefully, this will help.  He has not reported any more blood in his colostomy bag.  He does notice a bit more energy.  He continues to do his own.  Physical therapy at home.  He, reports, that his appetite is coming back.  He's not having any nausea, vomiting, any chest pain, shortness of breath, or cough.  He denies any fevers, chills, or night sweats.  He denies any lower leg swelling.  He denies any obvious, or normal, bleeding.  He denies any headaches, visual changes, or rashes.  He denies any type of pain.  His last myeloma studies in early February did not reveal monoclonal spike in his serum.  His IgG level was 1120 mg/dL, and his kappa light chain was 0.7 mg/dL. .  We did consider Revlimid, however, with the history of pulmonary embolism and DVT, T., we decided to hold off. He does remain on Xarelto.  Right now, we are focusing in on his quality of life.  Review of Systems: Constitutional:Negative for malaise/fatigue, fever, chills, weight loss, diaphoresis, activity change, appetite change, and unexpected weight change.  HEENT: Negative for double vision, blurred vision, visual loss, ear pain, tinnitus, congestion, rhinorrhea, epistaxis sore throat or sinus disease, oral pain/lesion, tongue soreness Respiratory: Negative for cough, chest tightness, shortness of breath, wheezing and stridor.   Cardiovascular: Negative for chest pain, palpitations, leg swelling, orthopnea, PND, DOE or claudication Gastrointestinal: Negative for nausea, vomiting, abdominal pain, diarrhea, constipation, blood in stool, melena, hematochezia, abdominal distention, anal bleeding, rectal pain, anorexia and hematemesis.  Genitourinary: Negative for dysuria, frequency, hematuria,  Musculoskeletal: Negative for myalgias, back pain, joint swelling, arthralgias and gait problem.  Skin: Negative for rash, color change, pallor and wound.  Neurological:. Negative for dizziness/light-headedness, tremors, seizures, syncope, facial asymmetry, speech difficulty, weakness, numbness, headaches and paresthesias.  Hematological: Negative for adenopathy. Does not bruise/bleed easily.  Psychiatric/Behavioral:  Negative for depression, no loss of interest in normal activity or change in sleep pattern.   Physical Exam: This is a somewhat frail-appearing 72 year old, white gentleman, in no obvious distress Vitals: Temperature 90.1 degrees, pulse 108, respirations 18, blood pressure 138/85.  Weight 211 pounds HEENT reveals a normocephalic, atraumatic skull, no scleral icterus, no oral lesions  Neck is supple without any cervical or supraclavicular adenopathy.  Lungs are clear to auscultation bilaterally. There are no wheezes, rales or rhonci Cardiac is regular rate and rhythm with a normal S1 and S2. There are no murmurs, rubs, or bruits.  Abdomen is soft with good bowel sounds, there is no palpable mass. There is no palpable hepatosplenomegaly. There is no palpable fluid wave.  Musculoskeletal no tenderness of the spine, ribs, or hips.  Extremities there are no clubbing, cyanosis, or edema.  Skin no petechia, purpura or ecchymosis Neurologic is nonfocal.  Laboratory Data: White count 4.8, hemoglobin 13.8, hematocrit 42.7, platelets 207,000  Current Outpatient Prescriptions on File Prior to Visit  Medication  Sig Dispense  Refill  . baclofen (LIORESAL) 10 MG tablet Take 1 tablet (10 mg total) by mouth 3 (three) times daily.  90 each  3  . diltiazem (CARDIZEM CD) 120 MG 24 hr capsule Take 1 capsule (120 mg total) by mouth daily.  30 capsule  2  . doxycycline (VIBRA-TABS) 100 MG tablet Take 1 tablet (100 mg total) by mouth 2 (two) times daily.  28 tablet  0  . lansoprazole (PREVACID) 30 MG capsule Take 1 capsule (30 mg total) by mouth daily.  30 capsule  3  . lisinopril (PRINIVIL,ZESTRIL) 2.5 MG tablet Take 1 tablet (2.5 mg total) by mouth daily.  30 tablet  2  . methocarbamol (ROBAXIN) 500 MG tablet Take 500 mg by mouth as needed.       . Ondansetron 4 MG FILM PLACE ONE STRIP ON TONGUE EVERY 12 HOURS PRN.  20 each  4  . oxyCODONE (OXY IR/ROXICODONE) 5 MG immediate release tablet Take 1 tablet (5 mg total) by mouth every 4 (four) hours as needed. Breakthrough pain  65 tablet  0  . polyethylene glycol powder (MIRALAX) powder Take 17 g by mouth as needed.       . prochlorperazine (COMPAZINE) 10 MG tablet Take 1 tablet (10 mg total) by mouth every 6 (six) hours as needed.  60 tablet  6  . Rivaroxaban (XARELTO) 20 MG TABS Take 1 tablet (20 mg total) by mouth daily.  30 tablet  3  . sodium chloride (DEEP SEA NASAL SPRAY) 0.65 % nasal spray Place into the nose as needed.      . traMADol (ULTRAM) 50 MG tablet Take 2 tablets (100 mg total) by mouth 2 (two) times daily.  120 tablet  1  . zolpidem (AMBIEN) 5 MG tablet Take 5 mg by mouth as needed. sleep       No current facility-administered medications on file prior to visit.    Assessment/Plan: This is a pleasant, 72 year old, white gentleman, with the following issues:  #1.  IgG kappa myeloma.  Overall, he responded quite well to chemotherapy, although his tolerance to chemotherapy, with limited.  He still continues on a break from Velcade.  #2.  Supportive therapy.  We will are still continuing to hold the Zometa.  #3.  Followup.  We will followup with Robert Berger in  4 weeks, but before then should there be questions or concerns.   ______________________________ [name], M.D. PRE/MEDQ  D:  09/25/2012  T:  09/26/2012  Job:  [id number]

## 2012-10-04 NOTE — Telephone Encounter (Signed)
Pt contacted to make 6 mo f/u appt. Appt made. Pt asked about his current medication list. I advised pt to contact Dr Brayton Caves and or Dr Myna Hidalgo to review current med list since they are the prescribing MDs. Pt states he understands.

## 2012-10-06 LAB — IFE INTERPRETATION

## 2012-10-06 LAB — KAPPA/LAMBDA LIGHT CHAINS
Kappa free light chain: 1.1 mg/dL (ref 0.33–1.94)
Kappa:Lambda Ratio: 1.11 (ref 0.26–1.65)
Lambda Free Lght Chn: 0.99 mg/dL (ref 0.57–2.63)

## 2012-10-06 LAB — IGG, IGA, IGM
IgA: 32 mg/dL — ABNORMAL LOW (ref 68–379)
IgG (Immunoglobin G), Serum: 821 mg/dL (ref 650–1600)

## 2012-10-06 LAB — PROTEIN ELECTROPHORESIS, SERUM, WITH REFLEX
Beta 2: 4.6 % (ref 3.2–6.5)
Gamma Globulin: 10.9 % — ABNORMAL LOW (ref 11.1–18.8)
M-Spike, %: 0.16 g/dL

## 2012-10-13 ENCOUNTER — Encounter: Payer: Self-pay | Admitting: Hematology & Oncology

## 2012-10-16 ENCOUNTER — Encounter: Payer: Self-pay | Admitting: Family

## 2012-10-17 ENCOUNTER — Encounter: Payer: Self-pay | Admitting: Family

## 2012-10-17 MED ORDER — METHOCARBAMOL 500 MG PO TABS: 500.0000 mg | ORAL_TABLET | Freq: Three times a day (TID) | ORAL | Status: DC | PRN

## 2012-10-17 NOTE — Telephone Encounter (Signed)
I do not see that we prescribed this before, please advise?

## 2012-10-18 ENCOUNTER — Other Ambulatory Visit: Payer: Medicare Other | Admitting: Lab

## 2012-10-18 ENCOUNTER — Ambulatory Visit: Payer: Medicare Other

## 2012-10-20 ENCOUNTER — Encounter (INDEPENDENT_AMBULATORY_CARE_PROVIDER_SITE_OTHER): Payer: Self-pay | Admitting: Surgery

## 2012-10-20 ENCOUNTER — Ambulatory Visit (INDEPENDENT_AMBULATORY_CARE_PROVIDER_SITE_OTHER): Payer: BC Managed Care – PPO | Admitting: Surgery

## 2012-10-20 VITALS — BP 132/86 | HR 115 | Temp 98.6°F | Resp 20 | Ht 68.0 in | Wt 208.6 lb

## 2012-10-20 DIAGNOSIS — K5720 Diverticulitis of large intestine with perforation and abscess without bleeding: Secondary | ICD-10-CM

## 2012-10-20 DIAGNOSIS — K5732 Diverticulitis of large intestine without perforation or abscess without bleeding: Secondary | ICD-10-CM

## 2012-10-20 NOTE — Progress Notes (Signed)
General Surgery [facility]  Visit Diagnoses: 1. 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 He remains under the care of his medical oncologist for treatment of myeloma. Overall he continues to do quite well with his colostomy.  PERTINENT REVIEW OF SYSTEMS: Occasional episodes of constipation treated with MiraLAX. Denies bleeding. Denies pain. Denies signs or symptoms of herniation.  EXAM: HEENT: normocephalic; pupils equal and reactive; sclerae clear; dentition good; mucous membranes moist NECK:  symmetric on extension; no palpable anterior or posterior cervical lymphadenopathy; no supraclavicular masses; no tenderness CHEST: clear to auscultation bilaterally without rales, rhonchi, or wheezes CARDIAC: regular rate and rhythm without significant murmur; peripheral pulses are full ABDOMEN: Abdomen is soft nontender without distention. Midline incision is well-healed. With Valsalva and cough there is no sign of herniation. Colostomy and left mid abdomen is functioning normally. There is no prolapse. There is no sign of parastomal hernia. EXT:  non-tender without edema; no deformity NEURO: no gross focal deficits; no sign of tremor   IMPRESSION: #1 status post Hartmann's resection with descending colostomy for perforated diverticular disease #2 multiple myeloma  PLAN: The patient and I again discussed whether or not he would consider colostomy reversal. At this point he is doing well. He has ongoing treatment for his myeloma. He is concerned about complications of wound healing. At this time, he does not desire colostomy reversal.  Patient has not had a full colonoscopy examination. I will leave this up to his medical oncologist whether to obtain this at some point in the near future.  Patient will return to this office for follow-up  as needed.  Velora Heckler, MD, [facility] Office: [phone]

## 2012-10-27 ENCOUNTER — Other Ambulatory Visit: Payer: Self-pay | Admitting: Medical

## 2012-10-27 DIAGNOSIS — C9000 Multiple myeloma not having achieved remission: Secondary | ICD-10-CM

## 2012-10-30 ENCOUNTER — Telehealth: Payer: Self-pay | Admitting: Family

## 2012-10-30 ENCOUNTER — Ambulatory Visit: Payer: Medicare Other | Admitting: Hematology & Oncology

## 2012-10-30 ENCOUNTER — Ambulatory Visit (HOSPITAL_BASED_OUTPATIENT_CLINIC_OR_DEPARTMENT_OTHER): Payer: BC Managed Care – PPO

## 2012-10-30 ENCOUNTER — Other Ambulatory Visit: Payer: Medicare Other | Admitting: Lab

## 2012-10-30 ENCOUNTER — Ambulatory Visit (HOSPITAL_BASED_OUTPATIENT_CLINIC_OR_DEPARTMENT_OTHER)
Admission: RE | Admit: 2012-10-30 | Discharge: 2012-10-30 | Disposition: A | Discharge status: Home / Self Care | Payer: BC Managed Care – PPO | Source: Ambulatory Visit | Attending: Hematology & Oncology | Admitting: Hematology & Oncology

## 2012-10-30 ENCOUNTER — Ambulatory Visit (HOSPITAL_BASED_OUTPATIENT_CLINIC_OR_DEPARTMENT_OTHER): Payer: BC Managed Care – PPO | Admitting: Hematology & Oncology

## 2012-10-30 ENCOUNTER — Ambulatory Visit: Payer: Medicare Other

## 2012-10-30 ENCOUNTER — Ambulatory Visit (HOSPITAL_BASED_OUTPATIENT_CLINIC_OR_DEPARTMENT_OTHER): Payer: BC Managed Care – PPO | Admitting: Lab

## 2012-10-30 ENCOUNTER — Telehealth: Payer: Self-pay | Admitting: Hematology & Oncology

## 2012-10-30 VITALS — BP 158/88 | HR 105 | Temp 98.6°F | Resp 18 | Ht 68.0 in | Wt 207.0 lb

## 2012-10-30 DIAGNOSIS — M25519 Pain in unspecified shoulder: Secondary | ICD-10-CM

## 2012-10-30 DIAGNOSIS — I10 Essential (primary) hypertension: Secondary | ICD-10-CM

## 2012-10-30 DIAGNOSIS — M549 Dorsalgia, unspecified: Secondary | ICD-10-CM

## 2012-10-30 DIAGNOSIS — C9000 Multiple myeloma not having achieved remission: Secondary | ICD-10-CM | POA: Insufficient documentation

## 2012-10-30 DIAGNOSIS — I2699 Other pulmonary embolism without acute cor pulmonale: Secondary | ICD-10-CM

## 2012-10-30 DIAGNOSIS — I82409 Acute embolism and thrombosis of unspecified deep veins of unspecified lower extremity: Secondary | ICD-10-CM

## 2012-10-30 LAB — CBC WITH DIFFERENTIAL (CANCER CENTER ONLY)
BASO#: 0 10*3/uL (ref 0.0–0.2)
EOS%: 0.5 % (ref 0.0–7.0)
HCT: 41.7 % (ref 38.7–49.9)
HGB: 14.1 g/dL (ref 13.0–17.1)
LYMPH#: 0.9 10*3/uL (ref 0.9–3.3)
LYMPH%: 16.1 % (ref 14.0–48.0)
MCHC: 33.8 g/dL (ref 32.0–35.9)
MCV: 86 fL (ref 82–98)
NEUT%: 75.3 % (ref 40.0–80.0)

## 2012-10-30 MED ORDER — SODIUM CHLORIDE 0.9 % IV SOLN
Freq: Once | INTRAVENOUS | Status: AC
  Administered 2012-10-30: 12:00:00 via INTRAVENOUS

## 2012-10-30 MED ORDER — ZOLEDRONIC ACID 4 MG/100ML IV SOLN
4.0000 mg | Freq: Once | INTRAVENOUS | Status: AC
Start: 2012-10-30 — End: 2012-10-30
  Administered 2012-10-30: 4 mg via INTRAVENOUS
  Filled 2012-10-30: qty 100

## 2012-10-30 MED ORDER — TRAMADOL HCL 50 MG PO TABS: 100.0000 mg | ORAL_TABLET | Freq: Two times a day (BID) | ORAL | Status: DC

## 2012-10-30 NOTE — Patient Instructions (Addendum)
Zoledronic Acid injection (Hypercalcemia, Oncology) What is this medicine? ZOLEDRONIC ACID (ZOE le dron ik AS id) lowers the amount of calcium loss from bone. It is used to treat too much calcium in your blood from cancer. It is also used to prevent complications of cancer that has spread to the bone. This medicine may be used for other purposes; ask your health care provider or pharmacist if you have questions. What should I tell my health care provider before I take this medicine? They need to know if you have any of these conditions: -aspirin-sensitive asthma -dental disease -kidney disease -an unusual or allergic reaction to zoledronic acid, other medicines, foods, dyes, or preservatives -pregnant or trying to get pregnant -breast-feeding How should I use this medicine? This medicine is for infusion into a vein. It is given by a health care professional in a hospital or clinic setting. Talk to your pediatrician regarding the use of this medicine in children. Special care may be needed. Overdosage: If you think you have taken too much of this medicine contact a poison control center or emergency room at once. NOTE: This medicine is only for you. Do not share this medicine with others. What if I miss a dose? It is important not to miss your dose. Call your doctor or health care professional if you are unable to keep an appointment. What may interact with this medicine? -certain antibiotics given by injection -NSAIDs, medicines for pain and inflammation, like ibuprofen or naproxen -some diuretics like bumetanide, furosemide -teriparatide -thalidomide This list may not describe all possible interactions. Give your health care provider a list of all the medicines, herbs, non-prescription drugs, or dietary supplements you use. Also tell them if you smoke, drink alcohol, or use illegal drugs. Some items may interact with your medicine. What should I watch for while using this medicine? Visit  your doctor or health care professional for regular checkups. It may be some time before you see the benefit from this medicine. Do not stop taking your medicine unless your doctor tells you to. Your doctor may order blood tests or other tests to see how you are doing. Women should inform their doctor if they wish to become pregnant or think they might be pregnant. There is a potential for serious side effects to an unborn child. Talk to your health care professional or pharmacist for more information. You should make sure that you get enough calcium and vitamin D while you are taking this medicine. Discuss the foods you eat and the vitamins you take with your health care professional. Some people who take this medicine have severe bone, joint, and/or muscle pain. This medicine may also increase your risk for a broken thigh bone. Tell your doctor right away if you have pain in your upper leg or groin. Tell your doctor if you have any pain that does not go away or that gets worse. What side effects may I notice from receiving this medicine? Side effects that you should report to your doctor or health care professional as soon as possible: -allergic reactions like skin rash, itching or hives, swelling of the face, lips, or tongue -anxiety, confusion, or depression -breathing problems -changes in vision -feeling faint or lightheaded, falls -jaw burning, cramping, pain -muscle cramps, stiffness, or weakness -trouble passing urine or change in the amount of urine Side effects that usually do not require medical attention (report to your doctor or health care professional if they continue or are bothersome): -bone, joint, or muscle pain -  fever -hair loss -irritation at site where injected -loss of appetite -nausea, vomiting -stomach upset -tired This list may not describe all possible side effects. Call your doctor for medical advice about side effects. You may report side effects to FDA at  1-800-FDA-1088. Where should I keep my medicine? This drug is given in a hospital or clinic and will not be stored at home. NOTE: This sheet is a summary. It may not cover all possible information. If you have questions about this medicine, talk to your doctor, pharmacist, or health care provider.  2012, [employer]. (01/22/2011 9:06:58 AM) 

## 2012-10-30 NOTE — Progress Notes (Signed)
CC:   Robert Craze, NP Adolph Pollack, M.D.  DIAGNOSES: 1. IgG kappa myeloma. 2. Deep venous thrombosis/pulmonary embolus. 3. Perforated diverticulum, status post colostomy.  CURRENT THERAPY: 1. Zometa 4 mg IV q.month. 2. Xarelto 20 mg p.o. daily.  INTERIM HISTORY:  [name] comes in for his followup.  He did not have the Ultram over the weekend.  It has been hurting a little bit more.  I did go ahead and send in refills to his pharmacy.  He said the Ultram really helps him out quite a bit.  His Velcade has been on hold.  He has a very poor tolerance to Velcade. As such, we have not been giving him Velcade now probably for 2-3 months.  When we saw him back in February, his M spike was 0.16 g/dL.  His IgG level was 821 mg/dL.  His serum kappa light chain was 1.1 mg/dL.  He does have some shoulder problems.  I think this is more arthritis than anything else.  I cannot imagine this being related to myeloma.  He has not had bone x-rays for awhile.  I ____off on doing x-rays right now.  His colostomy is working okay.  I am not sure if this is going to be reversed at some point in the future.  He is doing well with Xarelto.  There is no bleeding.  He has not noticed any kind of leg swelling.  PHYSICAL EXAMINATION:  General:  This is a well-developed, well- nourished white gentleman in no obvious distress.  Vital signs:  Show temperature of 98.6, pulse 105, respiratory rate 18, blood pressure 158/88.  Weight is 207.  Head and neck:  Shows a normocephalic, atraumatic skull.  There are no ocular or oral lesions.  There are no palpable cervical or supraclavicular lymph nodes.  Lungs:  Clear bilaterally.  Cardiac:  Regular rate and rhythm with a normal S1, S2. There are no murmurs, rubs or bruits.  Abdomen:  Soft with good bowel sounds.  There is no palpable abdominal mass.  There is no palpable hepatosplenomegaly.  He does have the colostomy, which is  intact. Extremities:  Show no clubbing, cyanosis or edema.  Neurological:  Shows no focal neurological deficits.  Skin:  Shows no rashes, ecchymoses or petechiae.  LABORATORY STUDIES:  White cell count is 5.5, hemoglobin 14.1, hematocrit 41.7, platelet count 186.  IMPRESSION:  [name] is a 73 year old gentleman with IgG kappa myeloma.  Again, he does not tolerate Velcade well.  I do not see that we need to get him on anything else right now.  I suppose that in the future, we may need to consider Revlimid for him.  I think we probably do need to get him onto Zometa again.  I am not sure when he got his last dose.  He will stay on the Xarelto.  I think this is going to be something that he will need for at least 1 year, if not longer.  We will get him back to see [place] in another month or so.    ______________________________ [name], M.D. PRE/MEDQ  D:  10/30/2012  T:  10/30/2012  Job:  [id number]

## 2012-10-30 NOTE — Telephone Encounter (Signed)
Tramadol  (this is all the request showed no strength or quantity)

## 2012-10-30 NOTE — Telephone Encounter (Signed)
Rx was provided by Dr Myna Hidalgo.

## 2012-10-30 NOTE — Telephone Encounter (Signed)
Pt moved 4-24 to 4-28 wants afternoon appointment

## 2012-10-30 NOTE — Progress Notes (Signed)
This office note has been dictated.

## 2012-11-01 LAB — COMPREHENSIVE METABOLIC PANEL
AST: 21 U/L (ref 0–37)
BUN: 8 mg/dL (ref 6–23)
CO2: 21 mEq/L (ref 19–32)
Calcium: 8.8 mg/dL (ref 8.4–10.5)
Chloride: 105 mEq/L (ref 96–112)
Creatinine, Ser: 0.78 mg/dL (ref 0.50–1.35)
Total Bilirubin: 0.7 mg/dL (ref 0.3–1.2)

## 2012-11-01 LAB — PROTEIN ELECTROPHORESIS, SERUM, WITH REFLEX
M-Spike, %: 0.17 g/dL
Total Protein, Serum Electrophoresis: 6.1 g/dL (ref 6.0–8.3)

## 2012-11-01 LAB — KAPPA/LAMBDA LIGHT CHAINS
Kappa free light chain: 1.1 mg/dL (ref 0.33–1.94)
Kappa:Lambda Ratio: 1.03 (ref 0.26–1.65)

## 2012-11-01 LAB — IGG, IGA, IGM
IgA: 41 mg/dL — ABNORMAL LOW (ref 68–379)
IgG (Immunoglobin G), Serum: 925 mg/dL (ref 650–1600)

## 2012-11-05 ENCOUNTER — Encounter: Payer: Self-pay | Admitting: Family

## 2012-11-07 ENCOUNTER — Telehealth: Payer: Self-pay | Admitting: *Deleted

## 2012-11-07 NOTE — Telephone Encounter (Signed)
Received fax from [employer] requesting records of all office notes, hospital records, laboratory test results, etc from 09/10/11 to present. Fax indicates this is a second request and information is needed by 11/17/12 or they will terminate / deny benefits. Request forwarded to [employer] for processing.

## 2012-11-29 ENCOUNTER — Other Ambulatory Visit: Payer: Self-pay | Admitting: Family

## 2012-11-30 ENCOUNTER — Other Ambulatory Visit: Payer: BC Managed Care – PPO | Admitting: Lab

## 2012-11-30 ENCOUNTER — Ambulatory Visit: Payer: BC Managed Care – PPO | Admitting: Hematology & Oncology

## 2012-12-04 ENCOUNTER — Ambulatory Visit (HOSPITAL_BASED_OUTPATIENT_CLINIC_OR_DEPARTMENT_OTHER): Payer: BC Managed Care – PPO | Admitting: Lab

## 2012-12-04 ENCOUNTER — Ambulatory Visit (HOSPITAL_BASED_OUTPATIENT_CLINIC_OR_DEPARTMENT_OTHER): Payer: BC Managed Care – PPO | Admitting: Hematology & Oncology

## 2012-12-04 ENCOUNTER — Ambulatory Visit (HOSPITAL_BASED_OUTPATIENT_CLINIC_OR_DEPARTMENT_OTHER): Payer: BC Managed Care – PPO

## 2012-12-04 VITALS — BP 140/90 | HR 110 | Temp 98.0°F | Resp 18 | Ht 68.0 in | Wt 215.0 lb

## 2012-12-04 DIAGNOSIS — C9000 Multiple myeloma not having achieved remission: Secondary | ICD-10-CM

## 2012-12-04 DIAGNOSIS — I82409 Acute embolism and thrombosis of unspecified deep veins of unspecified lower extremity: Secondary | ICD-10-CM

## 2012-12-04 DIAGNOSIS — I2699 Other pulmonary embolism without acute cor pulmonale: Secondary | ICD-10-CM

## 2012-12-04 DIAGNOSIS — Z7901 Long term (current) use of anticoagulants: Secondary | ICD-10-CM

## 2012-12-04 LAB — CBC WITH DIFFERENTIAL (CANCER CENTER ONLY)
BASO#: 0 10*3/uL (ref 0.0–0.2)
EOS%: 1.9 % (ref 0.0–7.0)
Eosinophils Absolute: 0.1 10*3/uL (ref 0.0–0.5)
LYMPH#: 0.8 10*3/uL — ABNORMAL LOW (ref 0.9–3.3)
MCH: 29.8 pg (ref 28.0–33.4)
MCHC: 32.6 g/dL (ref 32.0–35.9)
MCV: 92 fL (ref 82–98)
MONO#: 0.4 10*3/uL (ref 0.1–0.9)
NEUT#: 3.4 10*3/uL (ref 1.5–6.5)
NEUT%: 72.2 % (ref 40.0–80.0)
RDW: 15.1 % (ref 11.1–15.7)

## 2012-12-04 LAB — CMP (CANCER CENTER ONLY)
AST: 26 U/L (ref 11–38)
Alkaline Phosphatase: 53 U/L (ref 26–84)
BUN, Bld: 8 mg/dL (ref 7–22)
Glucose, Bld: 120 mg/dL — ABNORMAL HIGH (ref 73–118)
Potassium: 4.1 mEq/L (ref 3.3–4.7)
Sodium: 141 mEq/L (ref 128–145)
Total Bilirubin: 1 mg/dl (ref 0.20–1.60)

## 2012-12-04 MED ORDER — HEPARIN SOD (PORK) LOCK FLUSH 100 UNIT/ML IV SOLN
500.0000 [IU] | Freq: Once | INTRAVENOUS | Status: DC | PRN
  Filled 2012-12-04: qty 5

## 2012-12-04 MED ORDER — ZOLEDRONIC ACID 4 MG/100ML IV SOLN
4.0000 mg | Freq: Once | INTRAVENOUS | Status: AC
  Administered 2012-12-04: 4 mg via INTRAVENOUS
  Filled 2012-12-04: qty 100

## 2012-12-04 MED ORDER — SODIUM CHLORIDE 0.9 % IJ SOLN
10.0000 mL | INTRAMUSCULAR | Status: DC | PRN
  Filled 2012-12-04: qty 10

## 2012-12-04 MED ORDER — SODIUM CHLORIDE 0.9 % IV SOLN
Freq: Once | INTRAVENOUS | Status: AC
  Administered 2012-12-04: 15:00:00 via INTRAVENOUS

## 2012-12-04 MED ORDER — ALTEPLASE 2 MG IJ SOLR
2.0000 mg | Freq: Once | INTRAMUSCULAR | Status: DC | PRN
  Filled 2012-12-04: qty 2

## 2012-12-04 MED ORDER — SODIUM CHLORIDE 0.9 % IJ SOLN
3.0000 mL | Freq: Once | INTRAMUSCULAR | Status: DC | PRN
  Filled 2012-12-04: qty 10

## 2012-12-04 MED ORDER — HEPARIN SOD (PORK) LOCK FLUSH 100 UNIT/ML IV SOLN
250.0000 [IU] | Freq: Once | INTRAVENOUS | Status: DC | PRN
  Filled 2012-12-04: qty 5

## 2012-12-05 ENCOUNTER — Ambulatory Visit (INDEPENDENT_AMBULATORY_CARE_PROVIDER_SITE_OTHER): Payer: Medicare Other | Admitting: Family

## 2012-12-05 ENCOUNTER — Encounter: Payer: Self-pay | Admitting: Family

## 2012-12-05 VITALS — BP 130/80 | HR 95 | Temp 98.2°F | Resp 16 | Wt 216.0 lb

## 2012-12-05 DIAGNOSIS — E119 Type 2 diabetes mellitus without complications: Secondary | ICD-10-CM

## 2012-12-05 DIAGNOSIS — Z23 Encounter for immunization: Secondary | ICD-10-CM

## 2012-12-05 DIAGNOSIS — F329 Major depressive disorder, single episode, unspecified: Secondary | ICD-10-CM

## 2012-12-05 DIAGNOSIS — I10 Essential (primary) hypertension: Secondary | ICD-10-CM

## 2012-12-05 DIAGNOSIS — R5381 Other malaise: Secondary | ICD-10-CM

## 2012-12-05 DIAGNOSIS — C9000 Multiple myeloma not having achieved remission: Secondary | ICD-10-CM

## 2012-12-05 NOTE — Assessment & Plan Note (Signed)
Pt reports good blood sugars. Last a1c 6.0.  He will arrange eye exam.

## 2012-12-05 NOTE — Assessment & Plan Note (Signed)
He continues to improve his strength slowly.  Not yet strong enough to return to work.  He is interested in returning to work when able.

## 2012-12-05 NOTE — Patient Instructions (Addendum)
Please follow up in 6 weeks.

## 2012-12-05 NOTE — Assessment & Plan Note (Signed)
Stable.  Monitor off meds.

## 2012-12-05 NOTE — Progress Notes (Signed)
Subjective:    Patient ID: Robert Berger, male    DOB: October 19, 1940, 72 y.o.   MRN: [id number]  HPI  Robert Berger is a 72 yr old male who presents today for follow up.  1) DM2- last A1C 6 in January.  Reports sugars generally at or below 100.  Last eye exam- was " a while ago." Robert Berger will be setting up. Currently diet controlled.    2) HTN- Currently on diltiazem 120, lisinopril 2.5.    3) Depression- Robert Berger is not currently on meds. Reports that this is well controlled.    4) Shoulder pain- bilateral shoulders- has referral with ortho.   Review of Systems See HPI  Past Medical History  Diagnosis Date  . Arthritis   . GERD (gastroesophageal reflux disease)   . Complication of anesthesia     aspiration during surgery, difficult to wake up  . Multiple myeloma   . Status post radiation therapy 03/23/12 - 04/05/12    T8 - L4/ 20 Gy/ 10 Fractions  . Dysphagia 05/06/12    ED Visit    History   Social History  . Marital Status: Significant Other    Spouse Name: N/A    Number of Children: 3  . Years of Education: 14   Occupational History  . Games developer, on medical leave    Social History Main Topics  . Smoking status: Former Smoker    Types: Cigarettes    Quit date: 03/09/1978  . Smokeless tobacco: Never Used  . Alcohol Use: Yes     Comment: 1 glass of wine or a burbon daily., none in the last 3 months  . Drug Use: No  . Sexually Active: No   Other Topics Concern  . Not on file   Social History Narrative   Divorced x 2, lives with male partner (x 20 years). Normally able to ambulate without assistance.   2 children- both daughters who live in [place]   Completed some college   Enjoys computers, camping trailer.      Past Surgical History  Procedure Laterality Date  . Fracture surgery      ankle  . Hemorrhoid surgery    . Back surgery      47 years ago  . Laparotomy  05/14/2012    Procedure: EXPLORATORY LAPAROTOMY;  Surgeon: Velora Heckler, MD;  Location:  [facility];  Service: General;  Laterality: N/A;  . Colostomy  05/14/2012    Procedure: COLOSTOMY;  Surgeon: Velora Heckler, MD;  Location: [facility];  Service: General;  Laterality: N/A;    Family History  Problem Relation Age of Onset  . Cancer Maternal Grandfather   . Cancer Maternal Grandmother     Allergies  Allergen Reactions  . Penicillins Swelling    Current Outpatient Prescriptions on File Prior to Visit  Medication Sig Dispense Refill  . baclofen (LIORESAL) 10 MG tablet Take 1 tablet (10 mg total) by mouth 3 (three) times daily.  90 each  3  . diltiazem (CARDIZEM CD) 120 MG 24 hr capsule Take 1 capsule (120 mg total) by mouth daily.  30 capsule  2  . lansoprazole (PREVACID) 30 MG capsule Take 1 capsule (30 mg total) by mouth daily.  30 capsule  3  . lisinopril (PRINIVIL,ZESTRIL) 2.5 MG tablet TAKE 1 TABLET BY MOUTH EVERY DAY  30 tablet  0  . methocarbamol (ROBAXIN) 500 MG tablet Take 1 tablet (500 mg total) by mouth 3 (three) times daily as  needed.  30 tablet  0  . Ondansetron 4 MG FILM PLACE ONE STRIP ON TONGUE EVERY 12 HOURS PRN.  20 each  4  . oxyCODONE (OXY IR/ROXICODONE) 5 MG immediate release tablet Take 1 tablet (5 mg total) by mouth every 4 (four) hours as needed. Breakthrough pain  65 tablet  0  . polyethylene glycol powder (MIRALAX) powder Take 17 g by mouth as needed.       . Probiotic Product (PROBIOTIC DAILY PO) Take by mouth every morning.      . prochlorperazine (COMPAZINE) 10 MG tablet Take 1 tablet (10 mg total) by mouth every 6 (six) hours as needed.  60 tablet  6  . Rivaroxaban (XARELTO) 20 MG TABS Take 1 tablet (20 mg total) by mouth daily.  30 tablet  3  . traMADol (ULTRAM) 50 MG tablet Take 2 tablets (100 mg total) by mouth 2 (two) times daily.  120 tablet  4  . zolpidem (AMBIEN) 5 MG tablet Take 5 mg by mouth as needed. sleep       No current facility-administered medications on file prior to visit.    BP 130/80  Pulse 95  Temp(Src) 98.2 F (36.8 C)  (Oral)  Resp 16  Wt 216 lb (97.977 kg)  BMI 32.85 kg/m2  SpO2 98%       Objective:   Physical Exam  Constitutional: Robert Berger is oriented to person, place, and time. Robert Berger appears well-developed and well-nourished. No distress.  HENT:  Head: Normocephalic and atraumatic.  Cardiovascular: Normal rate and regular rhythm.   No murmur heard. Pulmonary/Chest: Effort normal and breath sounds normal. No respiratory distress. Robert Berger has no wheezes. Robert Berger has no rales. Robert Berger exhibits no tenderness.  Musculoskeletal: Robert Berger exhibits no edema.  Neurological: Robert Berger is alert and oriented to person, place, and time.  Psychiatric: Robert Berger has a normal mood and affect. His behavior is normal. Judgment and thought content normal.          Assessment & Plan:

## 2012-12-05 NOTE — Assessment & Plan Note (Signed)
This is being managed by Hematology. 

## 2012-12-05 NOTE — Addendum Note (Signed)
Addended by: Mervin Kung A on: 12/05/2012 03:22 PM   Modules accepted: Orders

## 2012-12-05 NOTE — Assessment & Plan Note (Signed)
BP stable on current meds. BMET up to date.  

## 2012-12-05 NOTE — Progress Notes (Signed)
CC:   Lubertha Basque. Jerl Santos, M.D. Adolph Pollack, M.D. Sandford Craze, NP  DIAGNOSES: 1. IgG kappa myeloma. 2. Deep vein thrombosis/pulmonary embolism.  CURRENT THERAPY: 1. Zometa 4 mg IV monthly. 2. Xarelto 20 mg p.o. daily.  INTERIM HISTORY:  Robert Berger comes in for a followup.  He actually looks incredibly well.  He comes in walking just with a cane now.  He has come a long, long way.  When we last saw him in March, his monoclonal spike was stable at 0.17 g/dL.  IgG level was 925 mg/dL.  Kappa free light chain was 1.1 mg/dL.  He is having issues with his shoulders and knees.  This, from my point of view, is not from myeloma.  We will go ahead and see about referral to an orthopedic surgeon.  I will see about having Dr. Jerl Santos see him.  His appetite is good.  He has had no nausea and vomiting.  He has had a good appetite.  There is no change in bowel or bladder habits.  He has a colostomy.  PHYSICAL EXAMINATION:  General:  This is a well-developed, well- nourished white gentleman in no obvious distress.  Vital signs: Temperature of 98, pulse 110, respiratory rate 18, blood pressure 140/90.  Weight is 215.  Head and neck:  Normocephalic, atraumatic skull.  There are no ocular or oral lesions.  There are no palpable cervical or supraclavicular lymph nodes.  Lungs:  Clear bilaterally. Cardiac:  Regular rate and rhythm with a normal S1 and S2.  There are no murmurs, rubs, or bruits.  Abdomen:  Soft with good bowel sounds.  There is no palpable abdominal mass.  There is no fluid wave.  There is no palpable hepatosplenomegaly.  He has a colostomy that is intact. Extremities:  No clubbing, cyanosis, or edema.  He does have some decreased range of motion of his shoulders.  There is no swelling of his knees.  Skin:  No rashes.  LABORATORY STUDIES:  White cell count is 4.7, hemoglobin 14.4, hematocrit 44.2, platelet count 170.  BUN is 18, creatinine 0.5.  IMPRESSION:  Mr.  Berger is a very nice 72 year old gentleman with IgG kappa myeloma.  He presented back in July of last year.  He has come a long, long way.  He had compression fractures of his spine and he could not walk.  We got kyphoplasty for him, which has helped.  He responds very, very well to the Velcade, but his tolerance to Velcade is not that good, so we put him on a "holiday" every now and then.  I am just glad that he is doing well from the myeloma point of view.  His quality of life will continue to improve.  We will go ahead and see about Orthopedic Surgery trying to see him.  We will continue the Zometa.  We will plan to get him back in another month.    ______________________________ [name], M.D. PRE/MEDQ  D:  12/04/2012  T:  12/05/2012  Job:  [id number]

## 2012-12-07 ENCOUNTER — Encounter: Payer: Self-pay | Admitting: *Deleted

## 2012-12-07 ENCOUNTER — Telehealth: Payer: Self-pay | Admitting: Hematology & Oncology

## 2012-12-07 ENCOUNTER — Other Ambulatory Visit: Payer: Self-pay | Admitting: *Deleted

## 2012-12-07 DIAGNOSIS — M25569 Pain in unspecified knee: Secondary | ICD-10-CM

## 2012-12-07 DIAGNOSIS — C9000 Multiple myeloma not having achieved remission: Secondary | ICD-10-CM

## 2012-12-07 DIAGNOSIS — M25519 Pain in unspecified shoulder: Secondary | ICD-10-CM

## 2012-12-07 NOTE — Telephone Encounter (Signed)
I talked to Dr. Nolon Nations office to make sure pt was scheduled for today. They did not know anything about it. Robert Berger is going to contact MD and call back.

## 2012-12-07 NOTE — Telephone Encounter (Signed)
Left pt message's on both phones to call for appointment, I did not leave details for the 5-1 at Dr. Nolon Nations office at 845 am at [facility] [phone], fax [phone]

## 2012-12-08 LAB — PROTEIN ELECTROPHORESIS, SERUM, WITH REFLEX
Alpha-2-Globulin: 15.2 % — ABNORMAL HIGH (ref 7.1–11.8)
Gamma Globulin: 12 % (ref 11.1–18.8)
M-Spike, %: 0.2 g/dL
Total Protein, Serum Electrophoresis: 6.3 g/dL (ref 6.0–8.3)

## 2012-12-08 LAB — KAPPA/LAMBDA LIGHT CHAINS
Kappa:Lambda Ratio: 1.65 (ref 0.26–1.65)
Lambda Free Lght Chn: 1.07 mg/dL (ref 0.57–2.63)

## 2012-12-08 LAB — IGG, IGA, IGM
IgG (Immunoglobin G), Serum: 941 mg/dL (ref 650–1600)
IgM, Serum: 50 mg/dL (ref 41–251)

## 2012-12-15 ENCOUNTER — Other Ambulatory Visit: Payer: Self-pay | Admitting: Hematology & Oncology

## 2012-12-15 ENCOUNTER — Telehealth: Payer: Self-pay | Admitting: Hematology & Oncology

## 2012-12-15 DIAGNOSIS — C9000 Multiple myeloma not having achieved remission: Secondary | ICD-10-CM

## 2012-12-15 NOTE — Telephone Encounter (Signed)
Pt aware of 5-30 appointment °

## 2012-12-15 NOTE — Telephone Encounter (Signed)
Pt called to schedule appointments. He is aware Dr. Myna Hidalgo said to call him back after he puts in orders.

## 2012-12-16 ENCOUNTER — Other Ambulatory Visit: Payer: Self-pay | Admitting: Family

## 2012-12-16 ENCOUNTER — Encounter: Payer: Self-pay | Admitting: Hematology & Oncology

## 2012-12-18 MED ORDER — METHOCARBAMOL 500 MG PO TABS: 500.0000 mg | ORAL_TABLET | Freq: Three times a day (TID) | ORAL | Status: DC | PRN

## 2012-12-19 ENCOUNTER — Telehealth: Payer: Self-pay | Admitting: *Deleted

## 2012-12-19 DIAGNOSIS — C9000 Multiple myeloma not having achieved remission: Secondary | ICD-10-CM

## 2012-12-19 DIAGNOSIS — M549 Dorsalgia, unspecified: Secondary | ICD-10-CM

## 2012-12-19 MED ORDER — METHOCARBAMOL 500 MG PO TABS: 500.0000 mg | ORAL_TABLET | Freq: Three times a day (TID) | ORAL | Status: DC | PRN

## 2012-12-19 NOTE — Telephone Encounter (Signed)
Pt called to provide an update from the email he sent yesterday. He is having low back pain in the area where his myeloma started. Has been taking Ultram and Oxy-IR without much relief. Robaxin 500 mg helps the most but is only lasts for about 6 hours. Reviewed with Dr Myna Hidalgo. May increase Robaxin to 500 mg 1-2 tabs q8h prn and to have a MRI of the lumbar spine. Pt requests an OPEN MRI. Request sent to scheduler to make the appt and rx sent via e-rx to [facility].

## 2012-12-20 ENCOUNTER — Ambulatory Visit
Admission: RE | Admit: 2012-12-20 | Discharge: 2012-12-20 | Disposition: A | Discharge status: Home / Self Care | Payer: BC Managed Care – PPO | Source: Ambulatory Visit | Attending: Hematology & Oncology | Admitting: Hematology & Oncology

## 2012-12-20 DIAGNOSIS — C9000 Multiple myeloma not having achieved remission: Secondary | ICD-10-CM

## 2012-12-20 DIAGNOSIS — M549 Dorsalgia, unspecified: Secondary | ICD-10-CM

## 2012-12-20 MED ORDER — GADOBENATE DIMEGLUMINE 529 MG/ML IV SOLN
18.0000 mL | Freq: Once | INTRAVENOUS | Status: AC | PRN
  Administered 2012-12-20: 18 mL via INTRAVENOUS

## 2012-12-22 ENCOUNTER — Encounter: Payer: Self-pay | Admitting: Hematology & Oncology

## 2012-12-25 ENCOUNTER — Encounter: Payer: Self-pay | Admitting: Interventional Radiology

## 2012-12-25 ENCOUNTER — Encounter: Payer: Self-pay | Admitting: Hematology & Oncology

## 2012-12-26 ENCOUNTER — Other Ambulatory Visit: Payer: Self-pay | Admitting: Hematology & Oncology

## 2012-12-26 DIAGNOSIS — M549 Dorsalgia, unspecified: Secondary | ICD-10-CM

## 2012-12-31 ENCOUNTER — Other Ambulatory Visit: Payer: Self-pay | Admitting: Family

## 2013-01-01 ENCOUNTER — Encounter: Payer: Self-pay | Admitting: Family

## 2013-01-01 DIAGNOSIS — I10 Essential (primary) hypertension: Secondary | ICD-10-CM

## 2013-01-01 DIAGNOSIS — C9000 Multiple myeloma not having achieved remission: Secondary | ICD-10-CM

## 2013-01-01 DIAGNOSIS — M549 Dorsalgia, unspecified: Secondary | ICD-10-CM

## 2013-01-02 ENCOUNTER — Encounter: Payer: Self-pay | Admitting: Family

## 2013-01-02 MED ORDER — LISINOPRIL 2.5 MG PO TABS: 2.5000 mg | ORAL_TABLET | Freq: Every day | ORAL | Status: DC

## 2013-01-02 NOTE — Telephone Encounter (Signed)
Robert Berger,  Could you please send these refills for 30 day supply 2 refills. thanks

## 2013-01-02 NOTE — Telephone Encounter (Signed)
Refills sent

## 2013-01-02 NOTE — Telephone Encounter (Signed)
Melissa, we do not have xarelto on pt's medication list. Still ok to send rx?

## 2013-01-03 MED ORDER — BACLOFEN 10 MG PO TABS: 10.0000 mg | ORAL_TABLET | Freq: Three times a day (TID) | ORAL | Status: DC

## 2013-01-03 MED ORDER — DILTIAZEM HCL ER COATED BEADS 120 MG PO CP24: 120.0000 mg | ORAL_CAPSULE | Freq: Every day | ORAL | Status: DC

## 2013-01-03 MED ORDER — RIVAROXABAN 20 MG PO TABS: 20.0000 mg | ORAL_TABLET | Freq: Every day | ORAL | Status: DC

## 2013-01-03 MED ORDER — TRAMADOL HCL 50 MG PO TABS: 100.0000 mg | ORAL_TABLET | Freq: Two times a day (BID) | ORAL | Status: DC

## 2013-01-03 MED ORDER — LISINOPRIL 2.5 MG PO TABS: 2.5000 mg | ORAL_TABLET | Freq: Every day | ORAL | Status: DC

## 2013-01-03 MED ORDER — LANSOPRAZOLE 30 MG PO CPDR: 30.0000 mg | DELAYED_RELEASE_CAPSULE | Freq: Every day | ORAL | Status: DC

## 2013-01-03 NOTE — Telephone Encounter (Signed)
My error, I overlooked xarelto on med list. Refills sent.

## 2013-01-03 NOTE — Telephone Encounter (Signed)
See 01/01/13 mychart message.

## 2013-01-05 ENCOUNTER — Ambulatory Visit (HOSPITAL_BASED_OUTPATIENT_CLINIC_OR_DEPARTMENT_OTHER): Payer: BC Managed Care – PPO | Admitting: Lab

## 2013-01-05 ENCOUNTER — Ambulatory Visit: Payer: Medicare Other | Admitting: Medical

## 2013-01-05 ENCOUNTER — Ambulatory Visit (HOSPITAL_BASED_OUTPATIENT_CLINIC_OR_DEPARTMENT_OTHER): Payer: BC Managed Care – PPO | Admitting: Medical

## 2013-01-05 ENCOUNTER — Ambulatory Visit (HOSPITAL_BASED_OUTPATIENT_CLINIC_OR_DEPARTMENT_OTHER): Payer: BC Managed Care – PPO

## 2013-01-05 ENCOUNTER — Other Ambulatory Visit: Payer: Medicare Other | Admitting: Lab

## 2013-01-05 ENCOUNTER — Ambulatory Visit: Payer: Medicare Other

## 2013-01-05 VITALS — BP 132/77 | HR 102 | Temp 98.0°F | Resp 18 | Ht 68.0 in | Wt 206.0 lb

## 2013-01-05 DIAGNOSIS — C9000 Multiple myeloma not having achieved remission: Secondary | ICD-10-CM

## 2013-01-05 LAB — CMP (CANCER CENTER ONLY)
ALT(SGPT): 24 U/L (ref 10–47)
CO2: 27 mEq/L (ref 18–33)
Sodium: 140 mEq/L (ref 128–145)
Total Bilirubin: 1 mg/dl (ref 0.20–1.60)
Total Protein: 6.9 g/dL (ref 6.4–8.1)

## 2013-01-05 LAB — CBC WITH DIFFERENTIAL (CANCER CENTER ONLY)
BASO#: 0 10*3/uL (ref 0.0–0.2)
Eosinophils Absolute: 0.1 10*3/uL (ref 0.0–0.5)
HGB: 15.5 g/dL (ref 13.0–17.1)
MCH: 30.5 pg (ref 28.0–33.4)
MONO%: 11.9 % (ref 0.0–13.0)
NEUT#: 2.3 10*3/uL (ref 1.5–6.5)
RBC: 5.08 10*6/uL (ref 4.20–5.70)

## 2013-01-05 MED ORDER — SODIUM CHLORIDE 0.9 % IV SOLN
Freq: Once | INTRAVENOUS | Status: AC
  Administered 2013-01-05: 15:00:00 via INTRAVENOUS

## 2013-01-05 MED ORDER — ZOLEDRONIC ACID 4 MG/100ML IV SOLN
4.0000 mg | Freq: Once | INTRAVENOUS | Status: AC
  Administered 2013-01-05: 4 mg via INTRAVENOUS
  Filled 2013-01-05: qty 100

## 2013-01-05 MED ORDER — ZOLEDRONIC ACID 4 MG/5ML IV CONC: 4.0000 mg | Freq: Once | INTRAVENOUS | Status: DC

## 2013-01-05 NOTE — Progress Notes (Signed)
DIAGNOSES: 1. IgG kappa myeloma. 2. Deep vein thrombosis/pulmonary embolism.  CURRENT THERAPY: 1. Zometa 4 mg IV monthly. 2. Xarelto 20 mg p.o. daily.  INTERIM HISTORY: [name] presents today for an office followup visit.  Overall he is doing relatively well.  He still having an issue with his back.  He did have an MR of the lumbar spine back on 12/22/2012 which revealed regression of interosseous lesions in the lumbar spine, compatible with response to treatment.  No new compression fractures.  He does have collapse of T12, T11 and T12 vertebral augmentation.  He does have a consult set up with orthopedic surgeon.  He does report that since his back has flared up he has been in bed more.  He reports that his legs do feel we are.  He was up and about walking with a cane and now he is on a walker.  In terms of his myeloma studies, back in March his M spike was 0.2 g/dL, his IgG level is 161 mg/dL and his kappa free light chain was 1.7 g/dL.  He's not reported any change in bowel or bladder habits.  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  He has a decent appetite.  He denies any nausea, vomiting, diarrhea or constipation.  He denies any fevers, chills or night sweats.  He denies any chest pain, shortness of breath or cough.  He denies any abdominal pain.  He denies any obvious or abnormal bleeding.  He denies any headaches, visual changes or rashes.  He remains on Xarelto without any complications.  Of note, he is on a Velcade "holiday" secondary to poor tolerance.  He remains on Zometa monthly.  Review of Systems: Constitutional:Negative for malaise/fatigue, fever, chills, weight loss, diaphoresis, activity change, appetite change, and unexpected weight change.  HEENT: Negative for double vision, blurred vision, visual loss, ear pain, tinnitus, congestion, rhinorrhea, epistaxis sore throat or sinus disease, oral pain/lesion, tongue soreness Respiratory: Negative for cough, chest tightness,  shortness of breath, wheezing and stridor.  Cardiovascular: Negative for chest pain, palpitations, leg swelling, orthopnea, PND, DOE or claudication Gastrointestinal: Negative for nausea, vomiting, abdominal pain, diarrhea, constipation, blood in stool, melena, hematochezia, abdominal distention, anal bleeding, rectal pain, anorexia and hematemesis.  Genitourinary: Negative for dysuria, frequency, hematuria,  Musculoskeletal: Negative for myalgias, back pain, joint swelling, arthralgias and gait problem.  Skin: Negative for rash, color change, pallor and wound.  Neurological:. Negative for dizziness/light-headedness, tremors, seizures, syncope, facial asymmetry, speech difficulty, weakness, numbness, headaches and paresthesias.  Hematological: Negative for adenopathy. Does not bruise/bleed easily.  Psychiatric/Behavioral:  Negative for depression, no loss of interest in normal activity or change in sleep pattern.   Physical Exam: This is a pleasant 72 year old well-developed well-nourished white gentleman in no obvious distress Vitals: Temperature 98.0 degrees pulse 22 respirations 18 blood pressure 132/77 weight 206 pounds HEENT reveals a normocephalic, atraumatic skull, no scleral icterus, no oral lesions  Neck is supple without any cervical or supraclavicular adenopathy.  Lungs are clear to auscultation bilaterally. There are no wheezes, rales or rhonci Cardiac is regular rate and rhythm with a normal S1 and S2. There are no murmurs, rubs, or bruits.  Abdomen is soft with good bowel sounds, there is no palpable mass. There is no palpable hepatosplenomegaly. There is no palpable fluid wave.  Musculoskeletal no tenderness of the spine, ribs, or hips.  Extremities there are no clubbing, cyanosis, or edema.  Skin no petechia, purpura or ecchymosis Neurologic is nonfocal.  Laboratory Data:  White count 3.9 hemoglobin 15.5 hematocrit 46.2 platelets 167,000  Current Outpatient Prescriptions on  File Prior to Visit  Medication Sig Dispense Refill  . baclofen (LIORESAL) 10 MG tablet Take 1 tablet (10 mg total) by mouth 3 (three) times daily.  90 each  2  . diltiazem (CARDIZEM CD) 120 MG 24 hr capsule Take 1 capsule (120 mg total) by mouth daily.  30 capsule  2  . lansoprazole (PREVACID) 30 MG capsule Take 1 capsule (30 mg total) by mouth daily.  30 capsule  2  . lisinopril (PRINIVIL,ZESTRIL) 2.5 MG tablet Take 1 tablet (2.5 mg total) by mouth daily.  30 tablet  2  . methocarbamol (ROBAXIN) 500 MG tablet Take 1 tablet (500 mg total) by mouth every 6 (six) hours as needed.  15 tablet  0  . methocarbamol (ROBAXIN) 500 MG tablet Take 1-2 tablets (500-1,000 mg total) by mouth 3 (three) times daily as needed.  120 tablet  0  . Ondansetron 4 MG FILM PLACE ONE STRIP ON TONGUE EVERY 12 HOURS PRN.  20 each  4  . oxyCODONE (OXY IR/ROXICODONE) 5 MG immediate release tablet Take 1 tablet (5 mg total) by mouth every 4 (four) hours as needed. Breakthrough pain  65 tablet  0  . polyethylene glycol powder (MIRALAX) powder Take 17 g by mouth as needed.       . Probiotic Product (PROBIOTIC DAILY PO) Take by mouth every morning.      . prochlorperazine (COMPAZINE) 10 MG tablet Take 1 tablet (10 mg total) by mouth every 6 (six) hours as needed.  60 tablet  6  . Rivaroxaban (XARELTO) 20 MG TABS Take 1 tablet (20 mg total) by mouth daily.  30 tablet  2  . traMADol (ULTRAM) 50 MG tablet Take 2 tablets (100 mg total) by mouth 2 (two) times daily.  120 tablet  2  . zolpidem (AMBIEN) 5 MG tablet Take 5 mg by mouth as needed. sleep       No current facility-administered medications on file prior to visit.   Assessment/Plan: This is a pleasant 72 year old white gentleman with the following issues:  #1.  IgG kappa myeloma.  He presented back in July of last year.  He did have compression fractures of his spine he could not walk.  He did have a kyphoplasty which significantly helped.  He has been on Velcade, but his  tolerance has not been well tolerated.  We have put him on a "holiday" every now and then.  His myeloma studies are still looking good. We will continue to monitor his myeloma studies.  #2.  Supportive therapy.  He will continue with Zometa every 4 weeks.  #3.  Followup.  We will follow back up with him in one month but before then should there be questions or concerns.

## 2013-01-05 NOTE — Patient Instructions (Signed)
Zoledronic Acid injection (Hypercalcemia, Oncology) What is this medicine? ZOLEDRONIC ACID (ZOE le dron ik AS id) lowers the amount of calcium loss from bone. It is used to treat too much calcium in your blood from cancer. It is also used to prevent complications of cancer that has spread to the bone. This medicine may be used for other purposes; ask your health care provider or pharmacist if you have questions. What should I tell my health care provider before I take this medicine? They need to know if you have any of these conditions: -aspirin-sensitive asthma -dental disease -kidney disease -an unusual or allergic reaction to zoledronic acid, other medicines, foods, dyes, or preservatives -pregnant or trying to get pregnant -breast-feeding How should I use this medicine? This medicine is for infusion into a vein. It is given by a health care professional in a hospital or clinic setting. Talk to your pediatrician regarding the use of this medicine in children. Special care may be needed. Overdosage: If you think you have taken too much of this medicine contact a poison control center or emergency room at once. NOTE: This medicine is only for you. Do not share this medicine with others. What if I miss a dose? It is important not to miss your dose. Call your doctor or health care professional if you are unable to keep an appointment. What may interact with this medicine? -certain antibiotics given by injection -NSAIDs, medicines for pain and inflammation, like ibuprofen or naproxen -some diuretics like bumetanide, furosemide -teriparatide -thalidomide This list may not describe all possible interactions. Give your health care provider a list of all the medicines, herbs, non-prescription drugs, or dietary supplements you use. Also tell them if you smoke, drink alcohol, or use illegal drugs. Some items may interact with your medicine. What should I watch for while using this medicine? Visit  your doctor or health care professional for regular checkups. It may be some time before you see the benefit from this medicine. Do not stop taking your medicine unless your doctor tells you to. Your doctor may order blood tests or other tests to see how you are doing. Women should inform their doctor if they wish to become pregnant or think they might be pregnant. There is a potential for serious side effects to an unborn child. Talk to your health care professional or pharmacist for more information. You should make sure that you get enough calcium and vitamin D while you are taking this medicine. Discuss the foods you eat and the vitamins you take with your health care professional. Some people who take this medicine have severe bone, joint, and/or muscle pain. This medicine may also increase your risk for a broken thigh bone. Tell your doctor right away if you have pain in your upper leg or groin. Tell your doctor if you have any pain that does not go away or that gets worse. What side effects may I notice from receiving this medicine? Side effects that you should report to your doctor or health care professional as soon as possible: -allergic reactions like skin rash, itching or hives, swelling of the face, lips, or tongue -anxiety, confusion, or depression -breathing problems -changes in vision -feeling faint or lightheaded, falls -jaw burning, cramping, pain -muscle cramps, stiffness, or weakness -trouble passing urine or change in the amount of urine Side effects that usually do not require medical attention (report to your doctor or health care professional if they continue or are bothersome): -bone, joint, or muscle pain -  fever -hair loss -irritation at site where injected -loss of appetite -nausea, vomiting -stomach upset -tired This list may not describe all possible side effects. Call your doctor for medical advice about side effects. You may report side effects to FDA at  1-800-FDA-1088. Where should I keep my medicine? This drug is given in a hospital or clinic and will not be stored at home. NOTE: This sheet is a summary. It may not cover all possible information. If you have questions about this medicine, talk to your doctor, pharmacist, or health care provider.  2012, [employer]. (01/22/2011 9:06:58 AM) 

## 2013-01-08 ENCOUNTER — Telehealth (HOSPITAL_COMMUNITY): Payer: Self-pay | Admitting: Interventional Radiology

## 2013-01-08 ENCOUNTER — Other Ambulatory Visit (HOSPITAL_COMMUNITY): Payer: Self-pay | Admitting: Interventional Radiology

## 2013-01-08 DIAGNOSIS — IMO0002 Reserved for concepts with insufficient information to code with codable children: Secondary | ICD-10-CM

## 2013-01-08 DIAGNOSIS — C9000 Multiple myeloma not having achieved remission: Secondary | ICD-10-CM

## 2013-01-08 DIAGNOSIS — M549 Dorsalgia, unspecified: Secondary | ICD-10-CM

## 2013-01-09 ENCOUNTER — Encounter (HOSPITAL_COMMUNITY): Payer: Self-pay | Admitting: Pharmacy Technician

## 2013-01-09 ENCOUNTER — Other Ambulatory Visit: Payer: Self-pay | Admitting: Radiology

## 2013-01-09 LAB — IFE INTERPRETATION

## 2013-01-09 LAB — KAPPA/LAMBDA LIGHT CHAINS
Kappa:Lambda Ratio: 3.23 — ABNORMAL HIGH (ref 0.26–1.65)
Lambda Free Lght Chn: 0.92 mg/dL (ref 0.57–2.63)

## 2013-01-09 LAB — IGG, IGA, IGM
IgA: 28 mg/dL — ABNORMAL LOW (ref 68–379)
IgM, Serum: 60 mg/dL (ref 41–251)

## 2013-01-09 LAB — PROTEIN ELECTROPHORESIS, SERUM, WITH REFLEX
Albumin ELP: 56.6 % (ref 55.8–66.1)
Alpha-1-Globulin: 4.5 % (ref 2.9–4.9)
Gamma Globulin: 13.1 % (ref 11.1–18.8)

## 2013-01-10 ENCOUNTER — Encounter (HOSPITAL_COMMUNITY): Payer: Self-pay

## 2013-01-10 ENCOUNTER — Ambulatory Visit (HOSPITAL_COMMUNITY)
Admission: RE | Admit: 2013-01-10 | Discharge: 2013-01-10 | Disposition: A | Discharge status: Home / Self Care | Payer: BC Managed Care – PPO | Source: Ambulatory Visit | Attending: Interventional Radiology | Admitting: Interventional Radiology

## 2013-01-10 ENCOUNTER — Other Ambulatory Visit (HOSPITAL_COMMUNITY): Payer: Self-pay | Admitting: Interventional Radiology

## 2013-01-10 VITALS — BP 115/74 | HR 82 | Temp 97.4°F | Resp 18 | Ht 68.0 in | Wt 206.0 lb

## 2013-01-10 DIAGNOSIS — Z923 Personal history of irradiation: Secondary | ICD-10-CM | POA: Insufficient documentation

## 2013-01-10 DIAGNOSIS — C9000 Multiple myeloma not having achieved remission: Secondary | ICD-10-CM

## 2013-01-10 DIAGNOSIS — Z87891 Personal history of nicotine dependence: Secondary | ICD-10-CM | POA: Insufficient documentation

## 2013-01-10 DIAGNOSIS — K219 Gastro-esophageal reflux disease without esophagitis: Secondary | ICD-10-CM | POA: Insufficient documentation

## 2013-01-10 DIAGNOSIS — M549 Dorsalgia, unspecified: Secondary | ICD-10-CM

## 2013-01-10 DIAGNOSIS — M129 Arthropathy, unspecified: Secondary | ICD-10-CM | POA: Insufficient documentation

## 2013-01-10 DIAGNOSIS — M8448XA Pathological fracture, other site, initial encounter for fracture: Secondary | ICD-10-CM | POA: Insufficient documentation

## 2013-01-10 DIAGNOSIS — IMO0002 Reserved for concepts with insufficient information to code with codable children: Secondary | ICD-10-CM

## 2013-01-10 LAB — CBC
HCT: 43.1 % (ref 39.0–52.0)
MCH: 30.6 pg (ref 26.0–34.0)
MCHC: 34.3 g/dL (ref 30.0–36.0)
MCV: 89 fL (ref 78.0–100.0)
Platelets: 177 10*3/uL (ref 150–400)
RDW: 14.2 % (ref 11.5–15.5)
WBC: 4 10*3/uL (ref 4.0–10.5)

## 2013-01-10 LAB — GLUCOSE, CAPILLARY: Glucose-Capillary: 94 mg/dL (ref 70–99)

## 2013-01-10 MED ORDER — FENTANYL CITRATE 0.05 MG/ML IJ SOLN
INTRAMUSCULAR | Status: AC
  Filled 2013-01-10: qty 4

## 2013-01-10 MED ORDER — MIDAZOLAM HCL 2 MG/2ML IJ SOLN
INTRAMUSCULAR | Status: AC | PRN
  Administered 2013-01-10 (×4): 1 mg via INTRAVENOUS

## 2013-01-10 MED ORDER — HYDROMORPHONE HCL PF 1 MG/ML IJ SOLN
INTRAMUSCULAR | Status: AC
  Filled 2013-01-10: qty 3

## 2013-01-10 MED ORDER — FENTANYL CITRATE 0.05 MG/ML IJ SOLN
INTRAMUSCULAR | Status: AC | PRN
  Administered 2013-01-10 (×4): 25 ug via INTRAVENOUS

## 2013-01-10 MED ORDER — SODIUM CHLORIDE 0.9 % IV SOLN
INTRAVENOUS | Status: AC
Start: 2013-01-10 — End: 2013-01-10

## 2013-01-10 MED ORDER — VANCOMYCIN HCL IN DEXTROSE 1-5 GM/200ML-% IV SOLN
1000.0000 mg | INTRAVENOUS | Status: AC
  Administered 2013-01-10: 1000 mg via INTRAVENOUS

## 2013-01-10 MED ORDER — SODIUM CHLORIDE 0.9 % IV SOLN: INTRAVENOUS | Status: DC

## 2013-01-10 MED ORDER — MIDAZOLAM HCL 2 MG/2ML IJ SOLN
INTRAMUSCULAR | Status: AC
  Filled 2013-01-10: qty 6

## 2013-01-10 MED ORDER — TOBRAMYCIN SULFATE 1.2 G IJ SOLR
INTRAMUSCULAR | Status: AC
  Filled 2013-01-10: qty 1.2

## 2013-01-10 NOTE — Procedures (Signed)
S/P L3 KP with biopsy 

## 2013-01-10 NOTE — H&P (Signed)
Robert Berger is an 72 y.o. male.   Chief Complaint: Back pain x few weeks Located mid to low back toward Rt side. Recent MRI shows further collapse of T 12 Previous treatment 03/2012 -  ablation and KP of T9; T10; T11 and T12 Hx Multiple Myeloma scheduled now for evaluation and treatment  On Xarelto- has missed a dose HPI: MM; GERD; dysphagia  Past Medical History  Diagnosis Date  . Arthritis   . GERD (gastroesophageal reflux disease)   . Complication of anesthesia     aspiration during surgery, difficult to wake up  . Multiple myeloma   . Status post radiation therapy 03/23/12 - 04/05/12    T8 - L4/ 20 Gy/ 10 Fractions  . Dysphagia 05/06/12    ED Visit    Past Surgical History  Procedure Laterality Date  . Fracture surgery      ankle  . Hemorrhoid surgery    . Back surgery      47 years ago  . Laparotomy  05/14/2012    Procedure: EXPLORATORY LAPAROTOMY;  Surgeon: Velora Heckler, MD;  Location: [facility];  Service: General;  Laterality: N/A;  . Colostomy  05/14/2012    Procedure: COLOSTOMY;  Surgeon: Velora Heckler, MD;  Location: [facility];  Service: General;  Laterality: N/A;    Family History  Problem Relation Age of Onset  . Cancer Maternal Grandfather   . Cancer Maternal Grandmother    Social History:  reports that he quit smoking about 34 years ago. His smoking use included Cigarettes. He smoked 0.00 packs per day. He has never used smokeless tobacco. He reports that  drinks alcohol. He reports that he does not use illicit drugs.  Allergies:  Allergies  Allergen Reactions  . Penicillins Nausea And Vomiting, Swelling and Rash     (Not in a hospital admission)  Results for orders placed during the hospital encounter of 01/10/13 (from the past 48 hour(s))  CBC     Status: None   Collection Time    01/10/13  7:21 AM      Result Value Range   WBC 4.0  4.0 - 10.5 K/uL   RBC 4.84  4.22 - 5.81 MIL/uL   Hemoglobin 14.8  13.0 - 17.0 g/dL   HCT 11.9  14.7 - 82.9 %   MCV  89.0  78.0 - 100.0 fL   MCH 30.6  26.0 - 34.0 pg   MCHC 34.3  30.0 - 36.0 g/dL   RDW 56.2  13.0 - 86.5 %   Platelets 177  150 - 400 K/uL   No results found.  Review of Systems  Constitutional: Negative for fever.  Respiratory: Negative for shortness of breath.   Cardiovascular: Negative for chest pain.  Gastrointestinal: Negative for nausea, vomiting and abdominal pain.  Musculoskeletal: Positive for back pain.  Neurological: Positive for weakness. Negative for dizziness and headaches.    Blood pressure 133/89, pulse 80, temperature 97.9 F (36.6 C), temperature source Oral, resp. rate 18, height 5\' 8"  (1.727 m), weight 206 lb (93.441 kg), SpO2 95.00%. Physical Exam  Constitutional: He is oriented to person, place, and time. He appears well-developed and well-nourished.  Cardiovascular: Normal rate, regular rhythm and normal heart sounds.   No murmur heard. Respiratory: Effort normal and breath sounds normal. He has no wheezes.  GI: Soft. Bowel sounds are normal. There is no tenderness.  Musculoskeletal: Normal range of motion.  Neurological: He is alert and oriented to person, place, and time.  No cranial nerve deficit.  Uses walker  Psychiatric: He has a normal mood and affect. His behavior is normal. Judgment and thought content normal.     Assessment/Plan MM; Back pain Recent MRI does confirm further collapse T12 Scheduled now for evaluation and treatment Pt aware of probable Kyphpoplasty/Vertebroplasty/Sacroplasty to be performed in IR Risks and benefits discussed Agreeable to proceed Consent signed and in chart  Licet Dunphy A 01/10/2013, 7:37 AM

## 2013-01-14 ENCOUNTER — Encounter: Payer: Self-pay | Admitting: Family

## 2013-01-16 ENCOUNTER — Ambulatory Visit: Payer: BC Managed Care – PPO | Admitting: Family

## 2013-01-19 ENCOUNTER — Telehealth: Payer: Self-pay | Admitting: *Deleted

## 2013-01-19 ENCOUNTER — Telehealth: Payer: Self-pay | Admitting: Hematology & Oncology

## 2013-01-19 ENCOUNTER — Other Ambulatory Visit (HOSPITAL_COMMUNITY): Payer: Self-pay | Admitting: Interventional Radiology

## 2013-01-19 DIAGNOSIS — M549 Dorsalgia, unspecified: Secondary | ICD-10-CM

## 2013-01-19 DIAGNOSIS — IMO0002 Reserved for concepts with insufficient information to code with codable children: Secondary | ICD-10-CM

## 2013-01-19 NOTE — Telephone Encounter (Addendum)
Message copied by Mirian Capuchin on Fri Jan 19, 2013  9:58 AM ------      Message from: Arlan Organ R      Created: Wed Jan 10, 2013  7:03 PM       Please call and let him know that the myeloma is slowly coming back and that we don't need to  do anything for this right now. Thanks. Pete ------Pt called and given this message.  Voiced understanding.

## 2013-01-19 NOTE — Telephone Encounter (Signed)
Alvino Chapel transfer patient to me and stated to sch apt.  Patient sch lab/tracy/zometa apt for 01/29/13

## 2013-01-23 ENCOUNTER — Ambulatory Visit (INDEPENDENT_AMBULATORY_CARE_PROVIDER_SITE_OTHER): Payer: BC Managed Care – PPO | Admitting: Family

## 2013-01-23 ENCOUNTER — Encounter: Payer: Self-pay | Admitting: Family

## 2013-01-23 VITALS — BP 130/84 | HR 115 | Temp 97.3°F | Wt 207.1 lb

## 2013-01-23 DIAGNOSIS — R5381 Other malaise: Secondary | ICD-10-CM

## 2013-01-23 DIAGNOSIS — E119 Type 2 diabetes mellitus without complications: Secondary | ICD-10-CM

## 2013-01-23 LAB — HEMOGLOBIN A1C
Hgb A1c MFr Bld: 5.4 % (ref <5.7)
Mean Plasma Glucose: 108 mg/dL (ref <117)

## 2013-01-23 NOTE — Assessment & Plan Note (Signed)
Clinically stable.  Obtain A1C.  

## 2013-01-23 NOTE — Progress Notes (Signed)
Subjective:    Patient ID: Robert Berger, male    DOB: May 10, 1941, 72 y.o.   MRN: [id number]  HPI  Robert Berger is a 72 yr old male who presents today for follow up.  Reports that he had a recent "set back" in regards to his recovery.  He had episode of increased low back pain.  He was "flat on my back for 3 weeks" and subsequently underwent KP of L3 performed by Dr. Corliss Skains.  Reports that he is improving but still using a walker.  It is his hope to do some work from home in the near future.  DM2-diet controlled. Checking occasionally. Checked it yesterday sugar was 92.  Post prandial 115-120.   Review of Systems    see HPI  Past Medical History  Diagnosis Date  . Arthritis   . GERD (gastroesophageal reflux disease)   . Complication of anesthesia     aspiration during surgery, difficult to wake up  . Multiple myeloma   . Status post radiation therapy 03/23/12 - 04/05/12    T8 - L4/ 20 Gy/ 10 Fractions  . Dysphagia 05/06/12    ED Visit    History   Social History  . Marital Status: Significant Other    Spouse Name: N/A    Number of Children: 3  . Years of Education: 14   Occupational History  . Games developer, on medical leave    Social History Main Topics  . Smoking status: Former Smoker    Types: Cigarettes    Quit date: 03/09/1978  . Smokeless tobacco: Never Used  . Alcohol Use: Yes     Comment: 1 glass of wine or a burbon daily., none in the last 3 months  . Drug Use: No  . Sexually Active: No   Other Topics Concern  . Not on file   Social History Narrative   Divorced x 2, lives with male partner (x 20 years). Normally able to ambulate without assistance.   2 children- both daughters who live in [place]   Completed some college   Enjoys computers, camping trailer.      Past Surgical History  Procedure Laterality Date  . Fracture surgery      ankle  . Hemorrhoid surgery    . Back surgery      47 years ago  . Laparotomy  05/14/2012    Procedure:  EXPLORATORY LAPAROTOMY;  Surgeon: Velora Heckler, MD;  Location: [facility];  Service: General;  Laterality: N/A;  . Colostomy  05/14/2012    Procedure: COLOSTOMY;  Surgeon: Velora Heckler, MD;  Location: [facility];  Service: General;  Laterality: N/A;    Family History  Problem Relation Age of Onset  . Cancer Maternal Grandfather   . Cancer Maternal Grandmother     Allergies  Allergen Reactions  . Penicillins Nausea And Vomiting, Swelling and Rash    Current Outpatient Prescriptions on File Prior to Visit  Medication Sig Dispense Refill  . baclofen (LIORESAL) 10 MG tablet Take 1 tablet (10 mg total) by mouth 3 (three) times daily.  90 each  2  . diltiazem (CARDIZEM CD) 120 MG 24 hr capsule Take 1 capsule (120 mg total) by mouth daily.  30 capsule  2  . lansoprazole (PREVACID) 30 MG capsule Take 1 capsule (30 mg total) by mouth daily.  30 capsule  2  . lisinopril (PRINIVIL,ZESTRIL) 2.5 MG tablet Take 1 tablet (2.5 mg total) by mouth daily.  30 tablet  2  . methocarbamol (ROBAXIN) 500 MG tablet Take 500-1,000 mg by mouth 3 (three) times daily as needed. For pain/spasms      . Ondansetron 4 MG FILM Take 4 mg by mouth every 12 (twelve) hours as needed. PLACE ONE STRIP ON TONGUE EVERY 12 HOURS PRN.      Marland Kitchen polyethylene glycol powder (MIRALAX) powder Take 17 g by mouth daily as needed. For constipation      . Probiotic Product (PROBIOTIC DAILY PO) Take 1 tablet by mouth every morning.       . prochlorperazine (COMPAZINE) 10 MG tablet Take 10 mg by mouth every 6 (six) hours as needed. For nausea/vomiting      . Rivaroxaban (XARELTO) 20 MG TABS Take 1 tablet (20 mg total) by mouth daily.  30 tablet  2  . traMADol (ULTRAM) 50 MG tablet Take 2 tablets (100 mg total) by mouth 2 (two) times daily.  120 tablet  2  . zolpidem (AMBIEN) 5 MG tablet Take 5 mg by mouth as needed. For sleep       No current facility-administered medications on file prior to visit.    BP 130/84  Pulse 115  Temp(Src) 97.3 F  (36.3 C) (Oral)  Wt 207 lb 1.9 oz (93.949 kg)  BMI 31.5 kg/m2  SpO2 96%    Objective:   Physical Exam  Constitutional: He is oriented to person, place, and time. He appears well-developed and well-nourished.  Chronically ill appearing white male in NAD  Cardiovascular: Normal rate and regular rhythm.   No murmur heard. Pulmonary/Chest: Effort normal and breath sounds normal. No respiratory distress. He has no wheezes. He has no rales. He exhibits no tenderness.  Neurological: He is alert and oriented to person, place, and time.  Psychiatric: He has a normal mood and affect. His behavior is normal. Judgment and thought content normal.          Assessment & Plan:

## 2013-01-23 NOTE — Assessment & Plan Note (Signed)
He is working to regain the strength that he lost prior to his recent KP.  We did discuss that some part time work from home might be reasonable. His employer is looking in to this.

## 2013-01-23 NOTE — Patient Instructions (Addendum)
Please follow up in 2 months.  Complete lab work prior to leaving.

## 2013-01-24 ENCOUNTER — Ambulatory Visit (HOSPITAL_COMMUNITY)
Admission: RE | Admit: 2013-01-24 | Discharge: 2013-01-24 | Disposition: A | Discharge status: Home / Self Care | Payer: BC Managed Care – PPO | Source: Ambulatory Visit | Attending: Interventional Radiology | Admitting: Interventional Radiology

## 2013-01-24 DIAGNOSIS — IMO0002 Reserved for concepts with insufficient information to code with codable children: Secondary | ICD-10-CM

## 2013-01-24 DIAGNOSIS — M549 Dorsalgia, unspecified: Secondary | ICD-10-CM

## 2013-01-27 ENCOUNTER — Encounter: Payer: Self-pay | Admitting: Family

## 2013-01-29 ENCOUNTER — Ambulatory Visit (HOSPITAL_BASED_OUTPATIENT_CLINIC_OR_DEPARTMENT_OTHER): Payer: BC Managed Care – PPO | Admitting: Medical

## 2013-01-29 ENCOUNTER — Ambulatory Visit (HOSPITAL_BASED_OUTPATIENT_CLINIC_OR_DEPARTMENT_OTHER): Payer: BC Managed Care – PPO | Admitting: Lab

## 2013-01-29 ENCOUNTER — Ambulatory Visit (HOSPITAL_BASED_OUTPATIENT_CLINIC_OR_DEPARTMENT_OTHER): Payer: BC Managed Care – PPO

## 2013-01-29 VITALS — BP 126/75 | HR 95 | Temp 97.8°F | Resp 18 | Ht 68.0 in | Wt 207.0 lb

## 2013-01-29 DIAGNOSIS — C9000 Multiple myeloma not having achieved remission: Secondary | ICD-10-CM

## 2013-01-29 DIAGNOSIS — M4810 Ankylosing hyperostosis [Forestier], site unspecified: Secondary | ICD-10-CM

## 2013-01-29 LAB — CBC WITH DIFFERENTIAL (CANCER CENTER ONLY)
BASO%: 0.2 % (ref 0.0–2.0)
EOS%: 1.8 % (ref 0.0–7.0)
LYMPH#: 1.3 10*3/uL (ref 0.9–3.3)
MCHC: 33.8 g/dL (ref 32.0–35.9)
NEUT#: 2.7 10*3/uL (ref 1.5–6.5)
Platelets: 160 10*3/uL (ref 145–400)
RDW: 14.1 % (ref 11.1–15.7)

## 2013-01-29 MED ORDER — ZOLEDRONIC ACID 4 MG/100ML IV SOLN
4.0000 mg | Freq: Once | INTRAVENOUS | Status: AC
  Administered 2013-01-29: 4 mg via INTRAVENOUS
  Filled 2013-01-29: qty 100

## 2013-01-29 MED ORDER — SODIUM CHLORIDE 0.9 % IV SOLN
Freq: Once | INTRAVENOUS | Status: AC
  Administered 2013-01-29: 15:00:00 via INTRAVENOUS

## 2013-01-29 NOTE — Progress Notes (Signed)
DIAGNOSES: 1. IgG kappa myeloma. 2. Deep vein thrombosis/pulmonary embolism.  CURRENT THERAPY: 1. Zometa 4 mg IV monthly. 2. Xarelto 20 mg p.o. daily.  INTERIM HISTORY: [name] presents today for an office followup visit.  Overall he is doing relatively well.  He recently had a kyphoplasty performed by interventional radiology back on June 4th for collapse of T12.  He still continues to walk with a walker.  In terms of his myeloma studies, back in May his M spike was 0.4 g/dL, his IgG level is 1610 mg/dL and his kappa free light chain was 2.9 g/dL. He has had an increase in his Myeloma studies which will more than likely warrant restarting his Velcade. He has been on a Velcade "holdiay" secondary to poor tolerance in terms of nausea. He did not take his antiemetics with his previous Velcade.  He's not reported any change in bowel or bladder habits.  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  He has a decent appetite.  He denies any nausea, vomiting, diarrhea or constipation.  He denies any fevers, chills or night sweats.  He denies any chest pain, shortness of breath or cough.  He denies any abdominal pain.  He denies any obvious or abnormal bleeding.  He denies any headaches, visual changes or rashes.  He remains on Xarelto without any complications.    Review of Systems: Constitutional:Negative for malaise/fatigue, fever, chills, weight loss, diaphoresis, activity change, appetite change, and unexpected weight change.  HEENT: Negative for double vision, blurred vision, visual loss, ear pain, tinnitus, congestion, rhinorrhea, epistaxis sore throat or sinus disease, oral pain/lesion, tongue soreness Respiratory: Negative for cough, chest tightness, shortness of breath, wheezing and stridor.  Cardiovascular: Negative for chest pain, palpitations, leg swelling, orthopnea, PND, DOE or claudication Gastrointestinal: Negative for nausea, vomiting, abdominal pain, diarrhea, constipation, blood  in stool, melena, hematochezia, abdominal distention, anal bleeding, rectal pain, anorexia and hematemesis.  Genitourinary: Negative for dysuria, frequency, hematuria,  Musculoskeletal: Negative for myalgias, back pain, joint swelling, arthralgias and gait problem.  Skin: Negative for rash, color change, pallor and wound.  Neurological:. Negative for dizziness/light-headedness, tremors, seizures, syncope, facial asymmetry, speech difficulty, weakness, numbness, headaches and paresthesias.  Hematological: Negative for adenopathy. Does not bruise/bleed easily.  Psychiatric/Behavioral:  Negative for depression, no loss of interest in normal activity or change in sleep pattern.   Physical Exam: This is a pleasant 72 year old well-developed well-nourished white gentleman in no obvious distress Vitals: Temperature 97.8 degrees pulse 95 respirations 18 blood pressure 126/75 weight 207 pounds HEENT reveals a normocephalic, atraumatic skull, no scleral icterus, no oral lesions  Neck is supple without any cervical or supraclavicular adenopathy.  Lungs are clear to auscultation bilaterally. There are no wheezes, rales or rhonci day Cardiac is regular rate and rhythm with a normal S1 and S2. There are no murmurs, rubs, or bruits.  Abdomen is soft with good bowel sounds, there is no palpable mass. There is no palpable hepatosplenomegaly. There is no palpable fluid wave.  Musculoskeletal no tenderness of the spine, ribs, or hips.  Extremities there are no clubbing, cyanosis, or edema.  Skin no petechia, purpura or ecchymosis Neurologic is nonfocal.  Laboratory Data: White count 4.5 hemoglobin 15.7 hematocrit 46.5 platelets 160,000  Current Outpatient Prescriptions on File Prior to Visit  Medication Sig Dispense Refill  . baclofen (LIORESAL) 10 MG tablet Take 1 tablet (10 mg total) by mouth 3 (three) times daily.  90 each  2  . diltiazem (CARDIZEM CD) 120 MG 24  hr capsule Take 1 capsule (120 mg total) by  mouth daily.  30 capsule  2  . lansoprazole (PREVACID) 30 MG capsule Take 1 capsule (30 mg total) by mouth daily.  30 capsule  2  . lisinopril (PRINIVIL,ZESTRIL) 2.5 MG tablet Take 1 tablet (2.5 mg total) by mouth daily.  30 tablet  2  . methocarbamol (ROBAXIN) 500 MG tablet Take 1 tablet (500 mg total) by mouth every 6 (six) hours as needed.  15 tablet  0  . methocarbamol (ROBAXIN) 500 MG tablet Take 1-2 tablets (500-1,000 mg total) by mouth 3 (three) times daily as needed.  120 tablet  0  . Ondansetron 4 MG FILM PLACE ONE STRIP ON TONGUE EVERY 12 HOURS PRN.  20 each  4  . oxyCODONE (OXY IR/ROXICODONE) 5 MG immediate release tablet Take 1 tablet (5 mg total) by mouth every 4 (four) hours as needed. Breakthrough pain  65 tablet  0  . polyethylene glycol powder (MIRALAX) powder Take 17 g by mouth as needed.       . Probiotic Product (PROBIOTIC DAILY PO) Take by mouth every morning.      . prochlorperazine (COMPAZINE) 10 MG tablet Take 1 tablet (10 mg total) by mouth every 6 (six) hours as needed.  60 tablet  6  . Rivaroxaban (XARELTO) 20 MG TABS Take 1 tablet (20 mg total) by mouth daily.  30 tablet  2  . traMADol (ULTRAM) 50 MG tablet Take 2 tablets (100 mg total) by mouth 2 (two) times daily.  120 tablet  2  . zolpidem (AMBIEN) 5 MG tablet Take 5 mg by mouth as needed. sleep       No current facility-administered medications on file prior to visit.   Assessment/Plan: This is a pleasant 72 year old white gentleman with the following issues:  #1.  IgG kappa myeloma.  He presented back in July of last year.  He did have compression fractures of his spine he could not walk.  He did have a kyphoplasty which significantly helped.  He recently had another kyphoplasty to T12 back on June 4th. He has been on Velcade, but his tolerance has not been well tolerated.  We have put him on a "holiday" every now and then.  His myeloma studies are starting to creep back up there and more than likely we'll have to  restart chemotherapy.  He is well aware of this and will proceed.    #2.  Supportive therapy.  He will continue with Zometa every 4 weeks.  #3.  Followup.  We will follow back up with him in one month but before then should there be questions or concerns.

## 2013-01-29 NOTE — Patient Instructions (Signed)
Zoledronic Acid injection (Hypercalcemia, Oncology) What is this medicine? ZOLEDRONIC ACID (ZOE le dron ik AS id) lowers the amount of calcium loss from bone. It is used to treat too much calcium in your blood from cancer. It is also used to prevent complications of cancer that has spread to the bone. This medicine may be used for other purposes; ask your health care provider or pharmacist if you have questions. What should I tell my health care provider before I take this medicine? They need to know if you have any of these conditions: -aspirin-sensitive asthma -dental disease -kidney disease -an unusual or allergic reaction to zoledronic acid, other medicines, foods, dyes, or preservatives -pregnant or trying to get pregnant -breast-feeding How should I use this medicine? This medicine is for infusion into a vein. It is given by a health care professional in a hospital or clinic setting. Talk to your pediatrician regarding the use of this medicine in children. Special care may be needed. Overdosage: If you think you have taken too much of this medicine contact a poison control center or emergency room at once. NOTE: This medicine is only for you. Do not share this medicine with others. What if I miss a dose? It is important not to miss your dose. Call your doctor or health care professional if you are unable to keep an appointment. What may interact with this medicine? -certain antibiotics given by injection -NSAIDs, medicines for pain and inflammation, like ibuprofen or naproxen -some diuretics like bumetanide, furosemide -teriparatide -thalidomide This list may not describe all possible interactions. Give your health care provider a list of all the medicines, herbs, non-prescription drugs, or dietary supplements you use. Also tell them if you smoke, drink alcohol, or use illegal drugs. Some items may interact with your medicine. What should I watch for while using this medicine? Visit  your doctor or health care professional for regular checkups. It may be some time before you see the benefit from this medicine. Do not stop taking your medicine unless your doctor tells you to. Your doctor may order blood tests or other tests to see how you are doing. Women should inform their doctor if they wish to become pregnant or think they might be pregnant. There is a potential for serious side effects to an unborn child. Talk to your health care professional or pharmacist for more information. You should make sure that you get enough calcium and vitamin D while you are taking this medicine. Discuss the foods you eat and the vitamins you take with your health care professional. Some people who take this medicine have severe bone, joint, and/or muscle pain. This medicine may also increase your risk for a broken thigh bone. Tell your doctor right away if you have pain in your upper leg or groin. Tell your doctor if you have any pain that does not go away or that gets worse. What side effects may I notice from receiving this medicine? Side effects that you should report to your doctor or health care professional as soon as possible: -allergic reactions like skin rash, itching or hives, swelling of the face, lips, or tongue -anxiety, confusion, or depression -breathing problems -changes in vision -feeling faint or lightheaded, falls -jaw burning, cramping, pain -muscle cramps, stiffness, or weakness -trouble passing urine or change in the amount of urine Side effects that usually do not require medical attention (report to your doctor or health care professional if they continue or are bothersome): -bone, joint, or muscle pain -  fever -hair loss -irritation at site where injected -loss of appetite -nausea, vomiting -stomach upset -tired This list may not describe all possible side effects. Call your doctor for medical advice about side effects. You may report side effects to FDA at  1-800-FDA-1088. Where should I keep my medicine? This drug is given in a hospital or clinic and will not be stored at home. NOTE: This sheet is a summary. It may not cover all possible information. If you have questions about this medicine, talk to your doctor, pharmacist, or health care provider.  2013, [employer]. (01/22/2011 9:06:58 AM)  

## 2013-01-31 LAB — PROTEIN ELECTROPHORESIS, SERUM, WITH REFLEX
Alpha-2-Globulin: 14 % — ABNORMAL HIGH (ref 7.1–11.8)
Beta Globulin: 5.4 % (ref 4.7–7.2)
M-Spike, %: 0.71 g/dL
Total Protein, Serum Electrophoresis: 6.9 g/dL (ref 6.0–8.3)

## 2013-01-31 LAB — COMPREHENSIVE METABOLIC PANEL
ALT: 20 U/L (ref 0–53)
AST: 23 U/L (ref 0–37)
Albumin: 3.7 g/dL (ref 3.5–5.2)
Calcium: 9.2 mg/dL (ref 8.4–10.5)
Chloride: 104 mEq/L (ref 96–112)
Creatinine, Ser: 0.88 mg/dL (ref 0.50–1.35)
Potassium: 3.9 mEq/L (ref 3.5–5.3)

## 2013-01-31 LAB — IGG, IGA, IGM: IgA: 26 mg/dL — ABNORMAL LOW (ref 68–379)

## 2013-02-05 ENCOUNTER — Emergency Department (HOSPITAL_COMMUNITY): Payer: BC Managed Care – PPO

## 2013-02-05 ENCOUNTER — Emergency Department (HOSPITAL_COMMUNITY)
Admission: EM | Admit: 2013-02-05 | Discharge: 2013-02-05 | Disposition: A | Discharge status: Home / Self Care | Payer: BC Managed Care – PPO | Attending: Emergency Medicine | Admitting: Emergency Medicine

## 2013-02-05 ENCOUNTER — Encounter (HOSPITAL_COMMUNITY): Payer: Self-pay | Admitting: Family Medicine

## 2013-02-05 DIAGNOSIS — K5900 Constipation, unspecified: Secondary | ICD-10-CM | POA: Insufficient documentation

## 2013-02-05 DIAGNOSIS — R109 Unspecified abdominal pain: Secondary | ICD-10-CM | POA: Insufficient documentation

## 2013-02-05 DIAGNOSIS — K219 Gastro-esophageal reflux disease without esophagitis: Secondary | ICD-10-CM | POA: Insufficient documentation

## 2013-02-05 DIAGNOSIS — Z880 Allergy status to penicillin: Secondary | ICD-10-CM | POA: Insufficient documentation

## 2013-02-05 DIAGNOSIS — Z923 Personal history of irradiation: Secondary | ICD-10-CM | POA: Insufficient documentation

## 2013-02-05 DIAGNOSIS — Z87891 Personal history of nicotine dependence: Secondary | ICD-10-CM | POA: Insufficient documentation

## 2013-02-05 DIAGNOSIS — C9000 Multiple myeloma not having achieved remission: Secondary | ICD-10-CM | POA: Insufficient documentation

## 2013-02-05 DIAGNOSIS — Z8739 Personal history of other diseases of the musculoskeletal system and connective tissue: Secondary | ICD-10-CM | POA: Insufficient documentation

## 2013-02-05 DIAGNOSIS — Z79899 Other long term (current) drug therapy: Secondary | ICD-10-CM | POA: Insufficient documentation

## 2013-02-05 LAB — COMPREHENSIVE METABOLIC PANEL
ALT: 22 U/L (ref 0–53)
AST: 22 U/L (ref 0–37)
Alkaline Phosphatase: 63 U/L (ref 39–117)
Calcium: 9.3 mg/dL (ref 8.4–10.5)
GFR calc Af Amer: >90 mL/min (ref >90)
Glucose, Bld: 143 mg/dL — ABNORMAL HIGH (ref 70–99)
Potassium: 3.9 mEq/L (ref 3.5–5.1)
Sodium: 138 mEq/L (ref 135–145)
Total Protein: 7.1 g/dL (ref 6.0–8.3)

## 2013-02-05 LAB — URINALYSIS, ROUTINE W REFLEX MICROSCOPIC
Bilirubin Urine: NEGATIVE
Glucose, UA: NEGATIVE mg/dL
Hgb urine dipstick: NEGATIVE
Specific Gravity, Urine: 1.028 (ref 1.005–1.030)
pH: 6.5 (ref 5.0–8.0)

## 2013-02-05 LAB — CBC WITH DIFFERENTIAL/PLATELET
Basophils Absolute: 0 10*3/uL (ref 0.0–0.1)
Eosinophils Absolute: 0.1 10*3/uL (ref 0.0–0.7)
Eosinophils Relative: 1 % (ref 0–5)
Lymphocytes Relative: 18 % (ref 12–46)
Lymphs Abs: 1 10*3/uL (ref 0.7–4.0)
Neutrophils Relative %: 72 % (ref 43–77)
Platelets: 139 10*3/uL — ABNORMAL LOW (ref 150–400)
RBC: 4.92 MIL/uL (ref 4.22–5.81)
RDW: 13.9 % (ref 11.5–15.5)
WBC: 5.7 10*3/uL (ref 4.0–10.5)

## 2013-02-05 LAB — CG4 I-STAT (LACTIC ACID): Lactic Acid, Venous: 1.95 mmol/L (ref 0.5–2.2)

## 2013-02-05 MED ORDER — HYDROMORPHONE HCL PF 1 MG/ML IJ SOLN
1.0000 mg | Freq: Once | INTRAMUSCULAR | Status: AC
  Administered 2013-02-05: 1 mg via INTRAVENOUS
  Filled 2013-02-05: qty 1

## 2013-02-05 MED ORDER — IOHEXOL 300 MG/ML  SOLN
50.0000 mL | Freq: Once | INTRAMUSCULAR | Status: AC | PRN
  Administered 2013-02-05: 50 mL via ORAL

## 2013-02-05 MED ORDER — ONDANSETRON HCL 4 MG/2ML IJ SOLN
4.0000 mg | Freq: Once | INTRAMUSCULAR | Status: AC
  Administered 2013-02-05: 4 mg via INTRAVENOUS
  Filled 2013-02-05: qty 2

## 2013-02-05 MED ORDER — SODIUM CHLORIDE 0.9 % IV SOLN
Freq: Once | INTRAVENOUS | Status: AC
  Administered 2013-02-05: 02:00:00 via INTRAVENOUS

## 2013-02-05 MED ORDER — IOHEXOL 300 MG/ML  SOLN
100.0000 mL | Freq: Once | INTRAMUSCULAR | Status: AC | PRN
  Administered 2013-02-05: 100 mL via INTRAVENOUS
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
## 2013-02-05 NOTE — ED Notes (Signed)
Glick, MD at bedside.  

## 2013-02-05 NOTE — ED Notes (Signed)
Patient transported to CT 

## 2013-02-05 NOTE — ED Notes (Signed)
Patient states that he started having abdominal pain 3-4 hours ago and that the pain has progressively gotten worse. States "it may be constipation." States he has a colostomy and has been having hard stool in his colostomy.

## 2013-02-15 ENCOUNTER — Ambulatory Visit (HOSPITAL_BASED_OUTPATIENT_CLINIC_OR_DEPARTMENT_OTHER): Payer: BC Managed Care – PPO | Admitting: Lab

## 2013-02-15 ENCOUNTER — Ambulatory Visit (HOSPITAL_BASED_OUTPATIENT_CLINIC_OR_DEPARTMENT_OTHER): Payer: BC Managed Care – PPO | Admitting: Hematology & Oncology

## 2013-02-15 VITALS — BP 140/77 | HR 95 | Temp 98.6°F | Resp 18 | Ht 68.0 in | Wt 204.0 lb

## 2013-02-15 DIAGNOSIS — C9000 Multiple myeloma not having achieved remission: Secondary | ICD-10-CM

## 2013-02-15 LAB — CBC WITH DIFFERENTIAL (CANCER CENTER ONLY)
BASO#: 0 10*3/uL (ref 0.0–0.2)
BASO%: 0.8 % (ref 0.0–2.0)
Eosinophils Absolute: 0.1 10*3/uL (ref 0.0–0.5)
HCT: 45.8 % (ref 38.7–49.9)
HGB: 15.2 g/dL (ref 13.0–17.1)
LYMPH#: 0.9 10*3/uL (ref 0.9–3.3)
LYMPH%: 35.3 % (ref 14.0–48.0)
MCV: 92 fL (ref 82–98)
MONO#: 0.4 10*3/uL (ref 0.1–0.9)
NEUT%: 43.2 % (ref 40.0–80.0)
RBC: 4.97 10*6/uL (ref 4.20–5.70)
RDW: 14.8 % (ref 11.1–15.7)
WBC: 2.5 10*3/uL — ABNORMAL LOW (ref 4.0–10.0)

## 2013-02-15 NOTE — Progress Notes (Signed)
This office note has been dictated.

## 2013-02-16 NOTE — Progress Notes (Signed)
DIAGNOSES: 1. IgG kappa myeloma. 2. Deep venous thrombosis/pulmonary embolism. 3. The patient is status post exploratory laparotomy for perforated     diverticulum.  CURRENT THERAPY: 1. Xarelto 20 mg p.o. daily. 2. Zometa 4 mg IV monthly.  INTERVAL HISTORY:  [name] comes in for his followup.  He is doing a little bit better.  He did undergo kyphoplasty at L3 back in early June. This helped his back pain.  Thankfully, when they did the bone biopsy, the pathology report ([id number]_ [id number]) did not show any myeloma.  Unfortunately, his monoclonal spike is increasing.  His last monoclonal spike was 0.71 g/dL.  Back in May, it was 0.42 g/dL.  He is doing well with the Xarelto.  He has had no problems with leg swelling.  There has been no leg pain.  He has a colostomy that is working well.  He has had no cough.  He has had no fever.  He has had a good appetite. There has been no dysphagia or odynophagia.  PHYSICAL EXAMINATION:  General:  This is a well-developed, well- nourished white gentleman in no obvious distress.  Vital signs:  Shows temperature of 98.6, pulse 95, respiratory rate 18, blood pressure 140/77.  Weight is 204.  Head and neck:  Normocephalic, atraumatic skull.  There are no ocular or oral lesions.  There are no palpable cervical or supraclavicular lymph nodes.  Lungs:  Clear bilaterally. Cardiac:  Regular rate and rhythm with a normal S1 and S2.  There are no murmurs, rubs or bruits.  Abdomen:  Soft with good bowel sounds.  There is no palpable abdominal mass.  There is no fluid wave.  There is no palpable hepatosplenomegaly.  Back:  Shows no tenderness over the spine, ribs, or hips.  Extremities:  Show no clubbing, cyanosis or edema.  He has decent strength in his legs.  He has good range of motion of his joints.  Neurological:  Shows no focal neurological deficits.  LABORATORY STUDIES:  Show a white cell count of 2.9, hemoglobin 15.2, hematocrit 45,  platelet count 163.  IMPRESSION:  [name] who actually presented a year ago with myeloma.  He presented with compression fractures in his back.  We went ahead and got him into a fairly good remission with Velcade.  Unfortunately, he could not tolerate Velcade that well.  Again, his monoclonal spike is going up.  I think that pomalidomide would be a good option for him.  I think that he would be able to tolerate this well.  I would put him on Velcade at 4 mg a day for 21 days on and 7 days off.  I am not sure as to why he has the leukopenia.  We will just have to watch this.  Again, I think pomalidomide would be a good option for him.  It would be easy for him as this is oral.  We will work on getting the pomalidomide for him.  I will plan to get him back to see me in another month.  We will do the Zometa when he sees [place].    ______________________________ [name], M.D. PRE/MEDQ  D:  02/15/2013  T:  02/16/2013  Job:  [id number]

## 2013-02-17 ENCOUNTER — Emergency Department (HOSPITAL_COMMUNITY)
Admission: EM | Admit: 2013-02-17 | Discharge: 2013-02-17 | Disposition: A | Discharge status: Home / Self Care | Payer: BC Managed Care – PPO | Attending: Emergency Medicine | Admitting: Emergency Medicine

## 2013-02-17 ENCOUNTER — Emergency Department (HOSPITAL_COMMUNITY): Payer: BC Managed Care – PPO

## 2013-02-17 ENCOUNTER — Encounter (HOSPITAL_COMMUNITY): Payer: Self-pay | Admitting: *Deleted

## 2013-02-17 DIAGNOSIS — Z87891 Personal history of nicotine dependence: Secondary | ICD-10-CM | POA: Insufficient documentation

## 2013-02-17 DIAGNOSIS — Z8739 Personal history of other diseases of the musculoskeletal system and connective tissue: Secondary | ICD-10-CM | POA: Insufficient documentation

## 2013-02-17 DIAGNOSIS — Z880 Allergy status to penicillin: Secondary | ICD-10-CM | POA: Insufficient documentation

## 2013-02-17 DIAGNOSIS — Z79899 Other long term (current) drug therapy: Secondary | ICD-10-CM | POA: Insufficient documentation

## 2013-02-17 DIAGNOSIS — M549 Dorsalgia, unspecified: Secondary | ICD-10-CM | POA: Insufficient documentation

## 2013-02-17 DIAGNOSIS — Z87898 Personal history of other specified conditions: Secondary | ICD-10-CM | POA: Insufficient documentation

## 2013-02-17 DIAGNOSIS — Z5189 Encounter for other specified aftercare: Secondary | ICD-10-CM | POA: Insufficient documentation

## 2013-02-17 DIAGNOSIS — K5900 Constipation, unspecified: Secondary | ICD-10-CM

## 2013-02-17 DIAGNOSIS — R110 Nausea: Secondary | ICD-10-CM | POA: Insufficient documentation

## 2013-02-17 DIAGNOSIS — G8929 Other chronic pain: Secondary | ICD-10-CM | POA: Insufficient documentation

## 2013-02-17 DIAGNOSIS — K219 Gastro-esophageal reflux disease without esophagitis: Secondary | ICD-10-CM | POA: Insufficient documentation

## 2013-02-17 DIAGNOSIS — R17 Unspecified jaundice: Secondary | ICD-10-CM

## 2013-02-17 DIAGNOSIS — R109 Unspecified abdominal pain: Secondary | ICD-10-CM

## 2013-02-17 LAB — URINALYSIS, ROUTINE W REFLEX MICROSCOPIC
Ketones, ur: NEGATIVE mg/dL
Nitrite: NEGATIVE
Specific Gravity, Urine: 1.022 (ref 1.005–1.030)
pH: 6.5 (ref 5.0–8.0)

## 2013-02-17 LAB — CBC WITH DIFFERENTIAL/PLATELET
Basophils Relative: 0 % (ref 0–1)
Eosinophils Absolute: 0 10*3/uL (ref 0.0–0.7)
Eosinophils Relative: 1 % (ref 0–5)
HCT: 43.2 % (ref 39.0–52.0)
Hemoglobin: 14.4 g/dL (ref 13.0–17.0)
MCH: 29.9 pg (ref 26.0–34.0)
MCHC: 33.3 g/dL (ref 30.0–36.0)
Monocytes Absolute: 0.4 10*3/uL (ref 0.1–1.0)
Monocytes Relative: 11 % (ref 3–12)

## 2013-02-17 LAB — COMPREHENSIVE METABOLIC PANEL
Albumin: 3.1 g/dL — ABNORMAL LOW (ref 3.5–5.2)
BUN: 6 mg/dL (ref 6–23)
Calcium: 9.3 mg/dL (ref 8.4–10.5)
Creatinine, Ser: 0.71 mg/dL (ref 0.50–1.35)
Total Bilirubin: 2.3 mg/dL — ABNORMAL HIGH (ref 0.3–1.2)
Total Protein: 7.3 g/dL (ref 6.0–8.3)

## 2013-02-17 LAB — LIPASE, BLOOD: Lipase: 84 U/L — ABNORMAL HIGH (ref 11–59)

## 2013-02-17 LAB — URINE MICROSCOPIC-ADD ON

## 2013-02-17 MED ORDER — HYDROMORPHONE HCL PF 1 MG/ML IJ SOLN
1.0000 mg | Freq: Once | INTRAMUSCULAR | Status: AC
  Administered 2013-02-17: 1 mg via INTRAVENOUS
  Filled 2013-02-17: qty 1

## 2013-02-17 MED ORDER — BISACODYL 5 MG PO TBEC: 5.0000 mg | DELAYED_RELEASE_TABLET | Freq: Every day | ORAL | Status: DC | PRN

## 2013-02-17 MED ORDER — LACTULOSE 10 GM/15ML PO SOLN
20.0000 g | Freq: Once | ORAL | Status: AC
  Administered 2013-02-17: 20 g via ORAL
  Filled 2013-02-17 (×2): qty 30

## 2013-02-17 MED ORDER — ONDANSETRON HCL 4 MG/2ML IJ SOLN
4.0000 mg | Freq: Once | INTRAMUSCULAR | Status: AC
  Administered 2013-02-17: 4 mg via INTRAVENOUS
  Filled 2013-02-17: qty 2

## 2013-02-17 NOTE — ED Provider Notes (Signed)
History    CSN: [id number] Arrival date & time 02/17/13  7829  First MD Initiated Contact with Patient 02/17/13 [phone]     Chief Complaint  Patient presents with  . Abdominal Pain   (Consider location/radiation/quality/duration/timing/severity/associated sxs/prior Treatment) HPI Comments: Patient presents with left sided abdominal pain and back pain, as well as constipation.  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  States the output into his colostomy bag is significantly decreased and softer than usual.  Abdominal pain is described as pressure with occasional cramping, is constant.  Also has aching in his back, R>L.  States when his abdomen begins hurting, his back starts hurting.  Pain is worse with walking around.  Yesterday was nauseated, relieved with zofran.  Denies fevers, vomiting, urinary symptoms.  Has chronic back problems from surgery and multiple myeloma affecting his spine.  Pt seen in ED   PCP is Macario Carls, NP Oncologist is Dr Myna Hidalgo.   Patient is a 72 y.o. male presenting with abdominal pain. The history is provided by the patient, the spouse and medical records.  Abdominal Pain Associated symptoms include abdominal pain. Pertinent negatives include no chest pain, chills, coughing, fever, nausea or vomiting.   Past Medical History  Diagnosis Date  . Arthritis   . GERD (gastroesophageal reflux disease)   . Complication of anesthesia     aspiration during surgery, difficult to wake up  . Multiple myeloma   . Status post radiation therapy 03/23/12 - 04/05/12    T8 - L4/ 20 Gy/ 10 Fractions  . Dysphagia 05/06/12    ED Visit   Past Surgical History  Procedure Laterality Date  . Fracture surgery      ankle  . Hemorrhoid surgery    . Back surgery      47 years ago  . Laparotomy  05/14/2012    Procedure: EXPLORATORY LAPAROTOMY;  Surgeon: Velora Heckler, MD;  Location: [facility];  Service: General;  Laterality: N/A;  . Colostomy   05/14/2012    Procedure: COLOSTOMY;  Surgeon: Velora Heckler, MD;  Location: [facility];  Service: General;  Laterality: N/A;   Family History  Problem Relation Age of Onset  . Cancer Maternal Grandfather   . Cancer Maternal Grandmother    History  Substance Use Topics  . Smoking status: Former Smoker    Types: Cigarettes    Quit date: 03/09/1978  . Smokeless tobacco: Never Used  . Alcohol Use: Yes     Comment: 1 glass of wine or a burbon daily., none in the last 3 months    Review of Systems  Constitutional: Negative for fever and chills.  Respiratory: Negative for cough and shortness of breath.   Cardiovascular: Negative for chest pain and leg swelling.  Gastrointestinal: Positive for abdominal pain and constipation. Negative for nausea and vomiting.  Genitourinary: Negative for dysuria, urgency and frequency.  Musculoskeletal: Positive for back pain.    Allergies  Penicillins  Home Medications   Current Outpatient Rx  Name  Route  Sig  Dispense  Refill  . baclofen (LIORESAL) 10 MG tablet   Oral   Take 1 tablet (10 mg total) by mouth 3 (three) times daily.   90 each   2   . diltiazem (CARDIZEM CD) 120 MG 24 hr capsule   Oral   Take 120 mg by mouth every morning.         . lansoprazole (PREVACID) 30 MG capsule   Oral  Take 30 mg by mouth every morning.         . methocarbamol (ROBAXIN) 500 MG tablet   Oral   Take 500-1,000 mg by mouth 3 (three) times daily as needed (spasm/ pain). For pain/spasms         . ondansetron (ZOFRAN-ODT) 4 MG disintegrating tablet   Oral   Take 4 mg by mouth every 12 (twelve) hours as needed for nausea.          . polyethylene glycol (MIRALAX / GLYCOLAX) packet   Oral   Take 17 g by mouth daily as needed (constipation).          . Probiotic Product (PROBIOTIC DAILY PO)   Oral   Take 1 tablet by mouth every morning.          . Rivaroxaban (XARELTO) 20 MG TABS   Oral   Take 20 mg by mouth every morning.         .  traMADol (ULTRAM) 50 MG tablet   Oral   Take 2 tablets (100 mg total) by mouth 2 (two) times daily.   120 tablet   2    BP 107/53  Pulse 71  Temp(Src) 98.3 F (36.8 C) (Oral)  Resp 16  SpO2 97% Physical Exam  Nursing note and vitals reviewed. Constitutional: He appears well-developed and well-nourished. No distress.  HENT:  Head: Normocephalic and atraumatic.  Neck: Neck supple.  Cardiovascular: Normal rate, regular rhythm and intact distal pulses.   Pulmonary/Chest: Effort normal and breath sounds normal. No respiratory distress. He has no wheezes. He has no rales.  Abdominal: Soft. He exhibits no distension and no mass. There is tenderness in the epigastric area, periumbilical area, suprapubic area, left upper quadrant and left lower quadrant. There is no rebound and no guarding.    Musculoskeletal:       Arms: Neurological: He is alert. He exhibits normal muscle tone.  Skin: He is not diaphoretic.    ED Course  Procedures (including critical care time) Labs Reviewed  CBC WITH DIFFERENTIAL - Abnormal; Notable for the following:    WBC 3.5 (*)    Platelets 146 (*)    Lymphs Abs 0.5 (*)    All other components within normal limits  COMPREHENSIVE METABOLIC PANEL - Abnormal; Notable for the following:    Glucose, Bld 123 (*)    Albumin 3.1 (*)    AST 85 (*)    Alkaline Phosphatase 177 (*)    Total Bilirubin 2.3 (*)    All other components within normal limits  LIPASE, BLOOD - Abnormal; Notable for the following:    Lipase 84 (*)    All other components within normal limits  URINALYSIS, ROUTINE W REFLEX MICROSCOPIC - Abnormal; Notable for the following:    Color, Urine AMBER (*)    Bilirubin Urine SMALL (*)    Leukocytes, UA TRACE (*)    All other components within normal limits  LACTIC ACID, PLASMA  URINE MICROSCOPIC-ADD ON   Dg Abd Acute W/chest  02/17/2013   *RADIOLOGY REPORT*  Clinical Data: Left lower quadrant abdominal pain and history of colostomy for  diverticulitis.  ACUTE ABDOMEN SERIES (ABDOMEN 2 VIEW & CHEST 1 VIEW)  Comparison: CT of the abdomen and pelvis dated 02/05/2013.  Findings: There is no evidence of acute bowel obstruction or free intraperitoneal air.  Lung volumes are low bilaterally without infiltrates or edema.  Multiple augmented vertebral bodies are again identified containing methylmethacrylate.  No abnormal calcifications are  seen.  IMPRESSION: No acute findings.   Original Report Authenticated By: Irish Lack, M.D.   8:38 AM Pt reports improvement with dilaudid.    9:10 AM 8/10 to 6/10.  Declines more pain medication at this time.  Reexamination of the abdomen: soft, nondistended, tender to palpation throughout left side, less tender on right side but with positive Murphy's sign.   9:41 AM Discussed pt, workup and plan with Dr Rhunette Croft.   Dr Rhunette Croft has also seen and examined patient.   My reexamination of the patient's abdomen is unchanged.    11:46 AM I spoke with Dr Johna Sheriff regarding the patient's presentation, [place] report, and increased bilirubin.  Per our discussion, he suggests GI referral.   1. Abdominal pain   2. Constipation   3. Elevated bilirubin     MDM  Pt with decreased stool in colostomy and abdominal and back pain.  Pain is located diffusely over left side of abdomen.  Pt was seen for similar symptoms 02/05/13 with negative CT.  Xray negative for obstruction, WBC continues to be low (being followed by oncology), elevation of bilirubin and AST- US shows only gallbladder sludge - no stones or ductal dilitation.  Pt is minimally tender comparatively on left. Lactic acid is normal.  Pt is on xarelto - doubt clotting process.  Pt appearing comfortable during visit after one dose of dilaudid.  Afebrile, nontoxic.  Pt discussed with Dr Rhunette Croft who also saw and examined the patient.  Pt given lactulose in ED, d/c home with laxatives, GI follow up for elevated bilirubin.  Also advised to follow closely with PCP  and oncology.  Discussed all results with patient.  Pt given return precautions.  Pt verbalizes understanding and agrees with plan.      I doubt any other EMC precluding discharge at this time including, but not necessarily limited to the following:  Acute cholecystitis, ischemic colitis, SBO, aortic dissection/AAA rupture  [place], PA-C 02/17/13 1608

## 2013-02-17 NOTE — ED Notes (Signed)
Pt c/o abdominal pain, constipation. Pt has multiple myeloma and a colostomy. Last BM on past Thursday. Pt c/o back pain related to abdominal pain. Pt denies vomiting.

## 2013-02-18 NOTE — ED Provider Notes (Signed)
Medical screening examination/treatment/procedure(s) were conducted as a shared visit with non-physician practitioner(s) and myself.  I personally evaluated the patient during the encounter.  Pt with hx of cancer, comes in with diffuse abd pain, worst on the LLQ per my exam. Recent CT - no diverticular dz. [place] ordered for RUQ pain. Pain control provided.   Derwood Kaplan, MD 02/18/13 [phone]

## 2013-02-19 LAB — KAPPA/LAMBDA LIGHT CHAINS
Kappa free light chain: 17.1 mg/dL — ABNORMAL HIGH (ref 0.33–1.94)
Kappa:Lambda Ratio: 25.15 — ABNORMAL HIGH (ref 0.26–1.65)
Lambda Free Lght Chn: 0.68 mg/dL (ref 0.57–2.63)

## 2013-02-19 LAB — PROTEIN ELECTROPHORESIS, SERUM, WITH REFLEX
Albumin ELP: 50.2 % — ABNORMAL LOW (ref 55.8–66.1)
Alpha-2-Globulin: 13.9 % — ABNORMAL HIGH (ref 7.1–11.8)
M-Spike, %: 1.01 g/dL
Total Protein, Serum Electrophoresis: 6.9 g/dL (ref 6.0–8.3)

## 2013-02-19 LAB — COMPREHENSIVE METABOLIC PANEL
ALT: 35 U/L (ref 0–53)
CO2: 25 mEq/L (ref 19–32)
Calcium: 9 mg/dL (ref 8.4–10.5)
Chloride: 103 mEq/L (ref 96–112)
Creatinine, Ser: 0.83 mg/dL (ref 0.50–1.35)
Glucose, Bld: 100 mg/dL — ABNORMAL HIGH (ref 70–99)

## 2013-02-19 LAB — IGG, IGA, IGM: IgA: 21 mg/dL — ABNORMAL LOW (ref 68–379)

## 2013-02-22 ENCOUNTER — Encounter: Payer: Self-pay | Admitting: Hematology & Oncology

## 2013-02-24 ENCOUNTER — Encounter: Payer: Self-pay | Admitting: Family

## 2013-02-26 ENCOUNTER — Telehealth: Payer: Self-pay | Admitting: Family

## 2013-02-26 DIAGNOSIS — I10 Essential (primary) hypertension: Secondary | ICD-10-CM

## 2013-02-26 DIAGNOSIS — C9000 Multiple myeloma not having achieved remission: Secondary | ICD-10-CM

## 2013-02-26 DIAGNOSIS — M549 Dorsalgia, unspecified: Secondary | ICD-10-CM

## 2013-02-26 MED ORDER — DILTIAZEM HCL ER COATED BEADS 120 MG PO CP24: 120.0000 mg | ORAL_CAPSULE | Freq: Every morning | ORAL | Status: DC

## 2013-02-26 MED ORDER — TRAMADOL HCL 50 MG PO TABS: 100.0000 mg | ORAL_TABLET | Freq: Two times a day (BID) | ORAL | Status: DC

## 2013-02-26 MED ORDER — RIVAROXABAN 20 MG PO TABS: 20.0000 mg | ORAL_TABLET | Freq: Every morning | ORAL | Status: DC

## 2013-02-26 MED ORDER — BACLOFEN 10 MG PO TABS: 10.0000 mg | ORAL_TABLET | Freq: Three times a day (TID) | ORAL | Status: DC

## 2013-02-26 MED ORDER — LANSOPRAZOLE 30 MG PO CPDR: 30.0000 mg | DELAYED_RELEASE_CAPSULE | Freq: Every morning | ORAL | Status: DC

## 2013-02-26 NOTE — Telephone Encounter (Signed)
meds refilled 

## 2013-03-02 ENCOUNTER — Other Ambulatory Visit: Payer: Self-pay | Admitting: *Deleted

## 2013-03-02 DIAGNOSIS — C9000 Multiple myeloma not having achieved remission: Secondary | ICD-10-CM

## 2013-03-02 MED ORDER — POMALIDOMIDE 4 MG PO CAPS: 4.0000 mg | ORAL_CAPSULE | Freq: Every day | ORAL | Status: DC

## 2013-03-02 MED ORDER — POMALIDOMIDE 3 MG PO CAPS: 3.0000 mg | ORAL_CAPSULE | Freq: Every day | ORAL | Status: DC

## 2013-03-13 ENCOUNTER — Ambulatory Visit: Payer: BC Managed Care – PPO

## 2013-03-13 ENCOUNTER — Telehealth: Payer: Self-pay | Admitting: Hematology & Oncology

## 2013-03-13 ENCOUNTER — Other Ambulatory Visit: Payer: BC Managed Care – PPO | Admitting: Lab

## 2013-03-13 ENCOUNTER — Ambulatory Visit: Payer: BC Managed Care – PPO | Admitting: Hematology & Oncology

## 2013-03-13 NOTE — Telephone Encounter (Signed)
Pt called to advise, that he forgot time of his appt. Appt's for today has been canceled.

## 2013-03-14 ENCOUNTER — Telehealth: Payer: Self-pay | Admitting: Hematology & Oncology

## 2013-03-14 NOTE — Telephone Encounter (Signed)
Pt moved 8-5 to 8-8

## 2013-03-16 ENCOUNTER — Ambulatory Visit (HOSPITAL_BASED_OUTPATIENT_CLINIC_OR_DEPARTMENT_OTHER): Payer: BC Managed Care – PPO

## 2013-03-16 ENCOUNTER — Ambulatory Visit (HOSPITAL_BASED_OUTPATIENT_CLINIC_OR_DEPARTMENT_OTHER): Payer: BC Managed Care – PPO | Admitting: Lab

## 2013-03-16 ENCOUNTER — Ambulatory Visit (HOSPITAL_BASED_OUTPATIENT_CLINIC_OR_DEPARTMENT_OTHER): Payer: BC Managed Care – PPO | Admitting: Hematology & Oncology

## 2013-03-16 VITALS — BP 119/73 | HR 100 | Temp 98.7°F | Resp 18 | Ht 68.0 in | Wt 201.0 lb

## 2013-03-16 DIAGNOSIS — L989 Disorder of the skin and subcutaneous tissue, unspecified: Secondary | ICD-10-CM

## 2013-03-16 DIAGNOSIS — M4810 Ankylosing hyperostosis [Forestier], site unspecified: Secondary | ICD-10-CM

## 2013-03-16 DIAGNOSIS — C9000 Multiple myeloma not having achieved remission: Secondary | ICD-10-CM

## 2013-03-16 LAB — CBC WITH DIFFERENTIAL (CANCER CENTER ONLY)
BASO#: 0 10*3/uL (ref 0.0–0.2)
BASO%: 0.3 % (ref 0.0–2.0)
EOS%: 3.4 % (ref 0.0–7.0)
HCT: 42 % (ref 38.7–49.9)
HGB: 14.1 g/dL (ref 13.0–17.1)
LYMPH%: 24 % (ref 14.0–48.0)
MCH: 31.3 pg (ref 28.0–33.4)
MCHC: 33.6 g/dL (ref 32.0–35.9)
MONO%: 15.4 % — ABNORMAL HIGH (ref 0.0–13.0)
NEUT%: 56.9 % (ref 40.0–80.0)
RDW: 15.6 % (ref 11.1–15.7)

## 2013-03-16 LAB — CMP (CANCER CENTER ONLY)
Albumin: 3.2 g/dL — ABNORMAL LOW (ref 3.3–5.5)
Alkaline Phosphatase: 62 U/L (ref 26–84)
CO2: 27 mEq/L (ref 18–33)
Calcium: 8.8 mg/dL (ref 8.0–10.3)
Chloride: 103 mEq/L (ref 98–108)
Glucose, Bld: 148 mg/dL — ABNORMAL HIGH (ref 73–118)
Potassium: 4 mEq/L (ref 3.3–4.7)
Sodium: 138 mEq/L (ref 128–145)
Total Protein: 8.1 g/dL (ref 6.4–8.1)

## 2013-03-16 MED ORDER — SODIUM CHLORIDE 0.9 % IV SOLN
Freq: Once | INTRAVENOUS | Status: AC
  Administered 2013-03-16: 11:00:00 via INTRAVENOUS

## 2013-03-16 MED ORDER — ZOLEDRONIC ACID 4 MG/100ML IV SOLN
4.0000 mg | Freq: Once | INTRAVENOUS | Status: AC
  Administered 2013-03-16: 4 mg via INTRAVENOUS
  Filled 2013-03-16: qty 100

## 2013-03-16 NOTE — Progress Notes (Signed)
This office note has been dictated.

## 2013-03-16 NOTE — Patient Instructions (Signed)
Zoledronic Acid injection (Hypercalcemia, Oncology) What is this medicine? ZOLEDRONIC ACID (ZOE le dron ik AS id) lowers the amount of calcium loss from bone. It is used to treat too much calcium in your blood from cancer. It is also used to prevent complications of cancer that has spread to the bone. This medicine may be used for other purposes; ask your health care provider or pharmacist if you have questions. What should I tell my health care provider before I take this medicine? They need to know if you have any of these conditions: -aspirin-sensitive asthma -dental disease -kidney disease -an unusual or allergic reaction to zoledronic acid, other medicines, foods, dyes, or preservatives -pregnant or trying to get pregnant -breast-feeding How should I use this medicine? This medicine is for infusion into a vein. It is given by a health care professional in a hospital or clinic setting. Talk to your pediatrician regarding the use of this medicine in children. Special care may be needed. Overdosage: If you think you have taken too much of this medicine contact a poison control center or emergency room at once. NOTE: This medicine is only for you. Do not share this medicine with others. What if I miss a dose? It is important not to miss your dose. Call your doctor or health care professional if you are unable to keep an appointment. What may interact with this medicine? -certain antibiotics given by injection -NSAIDs, medicines for pain and inflammation, like ibuprofen or naproxen -some diuretics like bumetanide, furosemide -teriparatide -thalidomide This list may not describe all possible interactions. Give your health care provider a list of all the medicines, herbs, non-prescription drugs, or dietary supplements you use. Also tell them if you smoke, drink alcohol, or use illegal drugs. Some items may interact with your medicine. What should I watch for while using this medicine? Visit  your doctor or health care professional for regular checkups. It may be some time before you see the benefit from this medicine. Do not stop taking your medicine unless your doctor tells you to. Your doctor may order blood tests or other tests to see how you are doing. Women should inform their doctor if they wish to become pregnant or think they might be pregnant. There is a potential for serious side effects to an unborn child. Talk to your health care professional or pharmacist for more information. You should make sure that you get enough calcium and vitamin D while you are taking this medicine. Discuss the foods you eat and the vitamins you take with your health care professional. Some people who take this medicine have severe bone, joint, and/or muscle pain. This medicine may also increase your risk for a broken thigh bone. Tell your doctor right away if you have pain in your upper leg or groin. Tell your doctor if you have any pain that does not go away or that gets worse. What side effects may I notice from receiving this medicine? Side effects that you should report to your doctor or health care professional as soon as possible: -allergic reactions like skin rash, itching or hives, swelling of the face, lips, or tongue -anxiety, confusion, or depression -breathing problems -changes in vision -feeling faint or lightheaded, falls -jaw burning, cramping, pain -muscle cramps, stiffness, or weakness -trouble passing urine or change in the amount of urine Side effects that usually do not require medical attention (report to your doctor or health care professional if they continue or are bothersome): -bone, joint, or muscle pain -  fever -hair loss -irritation at site where injected -loss of appetite -nausea, vomiting -stomach upset -tired This list may not describe all possible side effects. Call your doctor for medical advice about side effects. You may report side effects to FDA at  1-800-FDA-1088. Where should I keep my medicine? This drug is given in a hospital or clinic and will not be stored at home. NOTE: This sheet is a summary. It may not cover all possible information. If you have questions about this medicine, talk to your doctor, pharmacist, or health care provider.  2012, [employer]. (01/22/2011 9:06:58 AM) 

## 2013-03-17 ENCOUNTER — Encounter: Payer: Self-pay | Admitting: Hematology & Oncology

## 2013-03-17 NOTE — Progress Notes (Signed)
DIAGNOSES: 1. IgG kappa myeloma. 2. Deep venous thrombosis/pulmonary embolism.  CURRENT THERAPIES: 1. The patient has not yet started pomalidomide 4 mg p.o. q. day. 2. Xarelto 20 mg p.o. q. day. 3. Zometa 4 mg IV q. month.  INTERIM HISTORY:  Robert Berger comes in for followup.  He is feeling okay.  We have still not gotten the pomalidomide for him.  Again, this is some insurance issue that we are dealing with.  He actually comes in walking.  He has a cane.  His back pain seems to be doing better.  He had a kyphoplasty at L3 about 2 months ago.  Again, we want to try to get him on pomalidomide.  This is a pill that he could take.  This would be easy for him.  His monoclonal studies have been gradually been going up.  When we last saw him in July, his monoclonal spike was 1.01 g/dL.  IgG level was 1640 mg/dL.  Kappa light chain was 17.10 mg/dL.  He has had no problem with bowels or bladder.  He has a colostomy that is intact.  There has been no leg swelling.  He has noticed a scaly lesion on his, I think, left forearm.  This is consistent with a squamous cell carcinoma.  Of course, he does not have a dermatologist, so we will have to make a referral.  He has had a good appetite.  He is eating fairly well.  He has had no cough or shortness of breath.  There is no headache.  He is really not tolerant of Velcade.  We have tried Velcade on him which works very well.  Unfortunately, it does have toxicity for him.  PHYSICAL EXAM:  General:  This is an elderly appearing white gentleman in no obvious distress.  Vital Signs:  Show a temperature of 98.7, pulse 100, respiratory rate 18, blood pressure 119/73.  Weight is 201 pounds. Head and Neck:  Show a normocephalic, atraumatic skull.  There are no ocular or oral lesions.  There are no palpable cervical or supraclavicular lymph nodes.  Lungs:  Clear bilaterally.  Cardiac: ,Regular rate and rhythm with a normal S1 and S2.  He has no  murmurs, rubs or bruits.  Abdomen:  Soft.  He has good bowel sounds.  There is no fluid wave.  There is no palpable hepatosplenomegaly.  He has his colostomy that is intact.  Extremities:  Show no clubbing, cyanosis, or edema.  Neurological:  Shows no focal neurological deficits.  LABORATORY STUDIES:  White cell count is 3.8.  Hemoglobin 14.1, hematocrit 42, platelet count 119.  IMPRESSION:  Robert Berger is a 72 year old gentleman with IgG kappa myeloma.  We are trying to get him back on therapy.  Again, he is not tolerant of Velcade.  We will hopefully be able to get pomalidomide for him.  He will get his Zometa today.  He does well with this.  We will also watch for any bleeding from the Xarelto.  I will refer him to Dr. Terri Piedra for this likely squamous cell carcinoma on his, I think, left forearm.  We will plan to get him back in another month or so for followup.    ______________________________ Robert Berger, M.D. PRE/MEDQ  D:  03/16/2013  T:  03/17/2013  Job:  [id number]

## 2013-03-18 ENCOUNTER — Emergency Department (HOSPITAL_COMMUNITY)
Admission: EM | Admit: 2013-03-18 | Discharge: 2013-03-19 | Disposition: A | Discharge status: Home / Self Care | Payer: BC Managed Care – PPO | Attending: Emergency Medicine | Admitting: Emergency Medicine

## 2013-03-18 ENCOUNTER — Encounter (HOSPITAL_COMMUNITY): Payer: Self-pay | Admitting: *Deleted

## 2013-03-18 ENCOUNTER — Emergency Department (HOSPITAL_COMMUNITY): Payer: BC Managed Care – PPO

## 2013-03-18 DIAGNOSIS — K219 Gastro-esophageal reflux disease without esophagitis: Secondary | ICD-10-CM | POA: Insufficient documentation

## 2013-03-18 DIAGNOSIS — Z79899 Other long term (current) drug therapy: Secondary | ICD-10-CM | POA: Insufficient documentation

## 2013-03-18 DIAGNOSIS — Z7901 Long term (current) use of anticoagulants: Secondary | ICD-10-CM | POA: Insufficient documentation

## 2013-03-18 DIAGNOSIS — Z8739 Personal history of other diseases of the musculoskeletal system and connective tissue: Secondary | ICD-10-CM | POA: Insufficient documentation

## 2013-03-18 DIAGNOSIS — Z880 Allergy status to penicillin: Secondary | ICD-10-CM | POA: Insufficient documentation

## 2013-03-18 DIAGNOSIS — C9000 Multiple myeloma not having achieved remission: Secondary | ICD-10-CM | POA: Insufficient documentation

## 2013-03-18 DIAGNOSIS — R109 Unspecified abdominal pain: Secondary | ICD-10-CM | POA: Insufficient documentation

## 2013-03-18 DIAGNOSIS — Z87891 Personal history of nicotine dependence: Secondary | ICD-10-CM | POA: Insufficient documentation

## 2013-03-18 LAB — CG4 I-STAT (LACTIC ACID): Lactic Acid, Venous: 1.72 mmol/L (ref 0.5–2.2)

## 2013-03-18 MED ORDER — HYDROMORPHONE HCL PF 1 MG/ML IJ SOLN
1.0000 mg | Freq: Once | INTRAMUSCULAR | Status: AC
  Administered 2013-03-18: 1 mg via INTRAVENOUS
  Filled 2013-03-18: qty 1

## 2013-03-18 MED ORDER — SODIUM CHLORIDE 0.9 % IV BOLUS (SEPSIS)
1000.0000 mL | Freq: Once | INTRAVENOUS | Status: AC
  Administered 2013-03-18: 1000 mL via INTRAVENOUS

## 2013-03-18 NOTE — ED Notes (Signed)
Patient transported to X-ray 

## 2013-03-18 NOTE — ED Notes (Signed)
Pt slumps over in chair, wife states I think he has fainted, pt looks up at nurse "I Fainted" reassessed VS 02 96% pulse 65, bp 96/56

## 2013-03-18 NOTE — ED Provider Notes (Signed)
CSN: [id number]     Arrival date & time 03/18/13  2229 History     First MD Initiated Contact with Patient 03/18/13 2316     Chief Complaint  Patient presents with  . Abdominal Pain   (Consider location/radiation/quality/duration/timing/severity/associated sxs/prior Treatment) HPI Comments: Pt is a 72 y/o male that has hx of diverticular perforation s/p diverting ostomy who presents with acute onset of abdominal pain that started approximately 4 hours ago. This was abrupt in onset, persistent, has become severe and is now 10 out of 10, worse with coughing breathing any movement. He has had liquid or very soft stool from his ostomy bag, no blood in the stool, no nausea or vomiting. He has been eating and drinking today without any difficulty until the onset of this pain.  Patient is a 72 y.o. male presenting with abdominal pain. The history is provided by the patient, the spouse and medical records.  Abdominal Pain   Past Medical History  Diagnosis Date  . Arthritis   . GERD (gastroesophageal reflux disease)   . Complication of anesthesia     aspiration during surgery, difficult to wake up  . Multiple myeloma   . Status post radiation therapy 03/23/12 - 04/05/12    T8 - L4/ 20 Gy/ 10 Fractions  . Dysphagia 05/06/12    ED Visit   Past Surgical History  Procedure Laterality Date  . Hemorrhoid surgery    . Laparotomy  05/14/2012    Procedure: EXPLORATORY LAPAROTOMY;  Surgeon: Velora Heckler, MD;  Location: [facility];  Service: General;  Laterality: N/A;  . Colostomy  05/14/2012    Procedure: COLOSTOMY;  Surgeon: Velora Heckler, MD;  Location: [facility];  Service: General;  Laterality: N/A;  . Back surgery      47 years ago  . Fracture surgery      ankle   Family History  Problem Relation Age of Onset  . Cancer Maternal Grandfather   . Cancer Maternal Grandmother    History  Substance Use Topics  . Smoking status: Former Smoker    Types: Cigarettes    Quit date: 03/09/1978  .  Smokeless tobacco: Never Used  . Alcohol Use: Yes     Comment: 1 glass of wine or a burbon daily., none in the last 3 months    Review of Systems  Gastrointestinal: Positive for abdominal pain.  All other systems reviewed and are negative.    Allergies  Penicillins  Home Medications   Current Outpatient Rx  Name  Route  Sig  Dispense  Refill  . baclofen (LIORESAL) 10 MG tablet   Oral   Take 1 tablet (10 mg total) by mouth 3 (three) times daily.   90 each   5   . bisacodyl (DULCOLAX) 5 MG EC tablet   Oral   Take 1 tablet (5 mg total) by mouth daily as needed for constipation.   14 tablet   0   . diltiazem (CARDIZEM CD) 120 MG 24 hr capsule   Oral   Take 1 capsule (120 mg total) by mouth every morning.   30 capsule   5   . lansoprazole (PREVACID) 30 MG capsule   Oral   Take 1 capsule (30 mg total) by mouth every morning.   30 capsule   5   . lisinopril (PRINIVIL,ZESTRIL) 2.5 MG tablet   Oral   Take 2.5 mg by mouth daily.          . methocarbamol (ROBAXIN)  500 MG tablet   Oral   Take 500-1,000 mg by mouth 3 (three) times daily as needed (spasm/ pain). For pain/spasms         . ondansetron (ZOFRAN-ODT) 4 MG disintegrating tablet   Oral   Take 4 mg by mouth every 12 (twelve) hours as needed for nausea.          . polyethylene glycol (MIRALAX / GLYCOLAX) packet   Oral   Take 17 g by mouth daily as needed (constipation).          . Probiotic Product (PROBIOTIC DAILY PO)   Oral   Take 1 tablet by mouth every morning.          . Rivaroxaban (XARELTO) 20 MG TABS   Oral   Take 1 tablet (20 mg total) by mouth every morning.   30 tablet   5   . traMADol (ULTRAM) 50 MG tablet   Oral   Take 2 tablets (100 mg total) by mouth 2 (two) times daily.   120 tablet   2   . oxyCODONE-acetaminophen (PERCOCET) 5-325 MG per tablet   Oral   Take 1 tablet by mouth every 4 (four) hours as needed for pain.   20 tablet   0   . pomalidomide (POMALYST) 3 MG  capsule   Oral   Take 1 capsule (3 mg total) by mouth daily. Take with water on days 1-21. Repeat every 28 days.   21 capsule   0    BP 122/69  Pulse 76  Temp(Src) 97.5 F (36.4 C) (Oral)  Resp 20  SpO2 100% Physical Exam  Nursing note and vitals reviewed. Constitutional: He appears well-developed and well-nourished. No distress.  HENT:  Head: Normocephalic and atraumatic.  Mouth/Throat: Oropharynx is clear and moist. No oropharyngeal exudate.  Eyes: Conjunctivae and EOM are normal. Pupils are equal, round, and reactive to light. Right eye exhibits no discharge. Left eye exhibits no discharge. No scleral icterus.  Neck: Normal range of motion. Neck supple. No JVD present. No thyromegaly present.  Cardiovascular: Normal rate, regular rhythm, normal heart sounds and intact distal pulses.  Exam reveals no gallop and no friction rub.   No murmur heard. Pulmonary/Chest: Effort normal and breath sounds normal. No respiratory distress. He has no wheezes. He has no rales.  Abdominal: Soft. Bowel sounds are normal. He exhibits no distension and no mass. There is tenderness. There is rebound and guarding.  Currently the patient has some peritoneal signs, however the pain is nonfocal, it is diffuse, there is some rigidity  Musculoskeletal: Normal range of motion. He exhibits no edema and no tenderness.  Lymphadenopathy:    He has no cervical adenopathy.  Neurological: He is alert. Coordination normal.  Skin: Skin is warm and dry. No rash noted. No erythema.  Psychiatric: He has a normal mood and affect. His behavior is normal.    ED Course   Procedures (including critical care time)  Labs Reviewed  BASIC METABOLIC PANEL - Abnormal; Notable for the following:    Glucose, Bld 141 (*)    GFR calc non Af Amer 85 (*)    All other components within normal limits  CBC WITH DIFFERENTIAL - Abnormal; Notable for the following:    RDW 15.7 (*)    Platelets 130 (*)    Monocytes Relative 17 (*)     Monocytes Absolute 1.1 (*)    All other components within normal limits  URINALYSIS, ROUTINE W REFLEX MICROSCOPIC - Abnormal; Notable  for the following:    Color, Urine AMBER (*)    Protein, ur 30 (*)    All other components within normal limits  HEPATIC FUNCTION PANEL - Abnormal; Notable for the following:    Albumin 3.3 (*)    AST 73 (*)    Total Bilirubin 1.4 (*)    Bilirubin, Direct 0.9 (*)    All other components within normal limits  URINE MICROSCOPIC-ADD ON  CG4 I-STAT (LACTIC ACID)   Ct Abdomen Pelvis W Contrast  03/19/2013   *RADIOLOGY REPORT*  Clinical Data: Abdominal pain.  CT ABDOMEN AND PELVIS WITH CONTRAST  Technique:  Multidetector CT imaging of the abdomen and pelvis was performed following the standard protocol during bolus administration of intravenous contrast.  Contrast: OMNIPAQUE IOHEXOL 300 MG/ML  SOLN  Comparison: CT of the abdomen and pelvis 02/05/2013.  Findings:  Lung Bases: Atherosclerotic calcifications in the right coronary artery.  Abdomen/Pelvis:  The appearance of the liver, gallbladder, pancreas, spleen, bilateral adrenal glands and the left kidney is unremarkable.  Subcentimeter low attenuation lesion in the right kidney in the interpolar region is too small to definitively characterize, but is similar to prior examination and is favored to represent a small cyst.  Postoperative changes of a partial sigmoidectomy are noted with a left lower quadrant colostomy.  A there is a small parastomal hernia containing a short loop of small bowel (likely jejunum).  No evidence of associated bowel obstruction at this time.  There are numerous colonic diverticulae, without surrounding inflammatory changes to suggest an acute diverticulitis at this time.  No significant volume of ascites.  No pneumoperitoneum.  No definite pathologic lymphadenopathy identified within the abdomen or pelvis. Mild atherosclerosis throughout the abdominal and pelvic vasculature, without  evidence of aneurysm or dissection.  Possible colonic wall thickening in the region of the hepatic flexure and proximal transverse colon, however, these areas are also a relatively under distended, which can result in a pseudothickening of the bowel wall.  Musculoskeletal: There are no aggressive appearing lytic or blastic lesions noted in the visualized portions of the skeleton.  Post vertebroplasty changes are again noted at T9, T11, T12 and L3.  Old vertebral body compression fracture at T12 with approximately 60% loss of anterior vertebral body height and 40% loss of posterior vertebral body height is unchanged.  IMPRESSION: 1.  Potential bowel wall thickening in the hepatic flexure and proximal transverse colon may suggest a colitis.  However, this area is relatively under distended, which can result in a pseudothickening of the bowel.  Clinical correlation is recommended. 2.  Status post partial sigmoidectomy with left lower quadrant colostomy.  Notably, there is a small parastomal hernia contains a short segment of small bowel.  No associated bowel obstruction or incarceration at this time. 3.  Colonic diverticulosis without findings to suggest acute diverticulitis at this time. 4.  Atherosclerosis including right coronary artery disease. 5.  Additional incidental findings, as above.   Original Report Authenticated By: Trudie Reed, M.D.   Dg Abd Acute W/chest  03/19/2013   *RADIOLOGY REPORT*  Clinical Data: Severe abdominal pain.  Evaluate for perforation.  ACUTE ABDOMEN SERIES (ABDOMEN 2 VIEW & CHEST 1 VIEW)  Comparison: 02/17/2013.  Findings: Low volume chest.  No focal opacity, edema, effusion, pneumothorax.  Normal heart size and mediastinal contour for technique. Calcified pulmonary nodule at the right base.  Negative for pneumoperitoneum.  Bowel gas pattern is nonspecific, with scattered stool and gas. A colostomy is again seen in  the left lower quadrant.  Persistent stool and gas within the  excluded rectum, also seen on abdominal CT 02/05/2013.  Numerous prior vertebral body augmentations.  No acute osseous findings.  IMPRESSION:  1.  Negative for pneumoperitoneum. 2.  Nonobstructive bowel gas pattern.   Original Report Authenticated By: Tiburcio Pea   1. Abdominal pain     MDM  The patient appears very uncomfortable, I suspect he has a possible perforation of his bowels given his exam, he will receive a stat  X-ray to evaluate for free air, pain medication, laboratory workup.  CT neg for acaute findings, no fre air, labs normal, pain meds given, and pt much better, repeat exam without pain / soft - pt stable for d/c.  No blood in stool, no nausea - doubt colitis - likely decompressed bowel.  Pt in agreement.  Meds given in ED:  Medications  oxyCODONE-acetaminophen (PERCOCET/ROXICET) 5-325 MG per tablet 2 tablet (not administered)  HYDROmorphone (DILAUDID) injection 1 mg (1 mg Intravenous Given 03/18/13 2343)  sodium chloride 0.9 % bolus 1,000 mL (0 mLs Intravenous Stopped 03/19/13 0112)  iohexol (OMNIPAQUE) 300 MG/ML solution 50 mL (50 mLs Oral Contrast Given 03/19/13 0051)  iohexol (OMNIPAQUE) 300 MG/ML solution 100 mL (100 mLs Intravenous Contrast Given 03/19/13 0113)    New Prescriptions   OXYCODONE-ACETAMINOPHEN (PERCOCET) 5-325 MG PER TABLET    Take 1 tablet by mouth every 4 (four) hours as needed for pain.      Vida Roller, MD 03/19/13 [phone]

## 2013-03-18 NOTE — ED Notes (Signed)
CG4 LACTIC ACID RESULTS GIVEN TO EDP MILLER

## 2013-03-18 NOTE — ED Notes (Signed)
Pt c/o abd pain for several areas, "like it was before he had the colostomy" last couple of times treated for constipation. Also given dilaudid " which helps more than the laxative" Pt states he feels like he needs to have a BM.

## 2013-03-19 ENCOUNTER — Emergency Department (HOSPITAL_COMMUNITY): Payer: BC Managed Care – PPO

## 2013-03-19 ENCOUNTER — Encounter (HOSPITAL_COMMUNITY): Payer: Self-pay

## 2013-03-19 ENCOUNTER — Telehealth: Payer: Self-pay | Admitting: Hematology & Oncology

## 2013-03-19 LAB — HEPATIC FUNCTION PANEL
Indirect Bilirubin: 0.5 mg/dL (ref 0.3–0.9)
Total Protein: 8.1 g/dL (ref 6.0–8.3)

## 2013-03-19 LAB — CBC WITH DIFFERENTIAL/PLATELET
Basophils Absolute: 0.1 10*3/uL (ref 0.0–0.1)
Basophils Relative: 1 % (ref 0–1)
Eosinophils Absolute: 0.1 10*3/uL (ref 0.0–0.7)
Hemoglobin: 13.9 g/dL (ref 13.0–17.0)
Lymphocytes Relative: 28 % (ref 12–46)
MCHC: 35 g/dL (ref 30.0–36.0)
Neutrophils Relative %: 53 % (ref 43–77)
RDW: 15.7 % — ABNORMAL HIGH (ref 11.5–15.5)

## 2013-03-19 LAB — URINALYSIS, ROUTINE W REFLEX MICROSCOPIC
Leukocytes, UA: NEGATIVE
Protein, ur: 30 mg/dL — AB
Urobilinogen, UA: 1 mg/dL (ref 0.0–1.0)

## 2013-03-19 LAB — BASIC METABOLIC PANEL
BUN: 7 mg/dL (ref 6–23)
GFR calc Af Amer: >90 mL/min (ref >90)
GFR calc non Af Amer: 85 mL/min — ABNORMAL LOW (ref >90)
Potassium: 3.7 mEq/L (ref 3.5–5.1)

## 2013-03-19 MED ORDER — OXYCODONE-ACETAMINOPHEN 5-325 MG PO TABS
2.0000 | ORAL_TABLET | Freq: Once | ORAL | Status: AC
  Administered 2013-03-19: 2 via ORAL
  Filled 2013-03-19: qty 2

## 2013-03-19 MED ORDER — IOHEXOL 300 MG/ML  SOLN
50.0000 mL | Freq: Once | INTRAMUSCULAR | Status: AC | PRN
  Administered 2013-03-19: 50 mL via ORAL

## 2013-03-19 MED ORDER — IOHEXOL 300 MG/ML  SOLN
100.0000 mL | Freq: Once | INTRAMUSCULAR | Status: AC | PRN
  Administered 2013-03-19: 100 mL via INTRAVENOUS

## 2013-03-19 MED ORDER — OXYCODONE-ACETAMINOPHEN 5-325 MG PO TABS: 1.0000 | ORAL_TABLET | ORAL | Status: DC | PRN

## 2013-03-19 NOTE — Telephone Encounter (Signed)
Toniann Fail at Dr. Elease Etienne office made 9-4 11am appointment. Pt aware of 9-4 and 9-5 appointments

## 2013-03-19 NOTE — ED Notes (Signed)
Pt ambulating independently w/ steady gait on d/c in no acute distress, A&Ox4. D/c instructions reviewed w/ pt and family - pt and family deny any further questions or concerns at present. Rx given x1, Pt instructed to not use alcohol, drive, or operate heavy machinery while take the prescription pain medications as they could make him drowsy - pt verbalized understanding.    

## 2013-03-19 NOTE — ED Notes (Signed)
Pt reports generalized abd that began today, pt w/ colostomy d/t diverticulitis perforation in October 2013, pt denies any vomiting - wife states pt's colostomy output has been WNL. Pt states he feels his abd is bloated, denies any fever however states he was having chills in the waiting area and "passed out three times in the wheelchair in the waiting room." Pt is A&Ox4, grimacing and guarding abd in bed - skin warm and dry. Wife at bedside.

## 2013-03-19 NOTE — ED Notes (Signed)
Patient transported to CT 

## 2013-03-20 ENCOUNTER — Ambulatory Visit: Payer: BC Managed Care – PPO | Admitting: Family

## 2013-03-20 LAB — PROTEIN ELECTROPHORESIS, SERUM, WITH REFLEX
Alpha-1-Globulin: 3.9 % (ref 2.9–4.9)
Alpha-2-Globulin: 12.8 % — ABNORMAL HIGH (ref 7.1–11.8)
Beta 2: 2.9 % — ABNORMAL LOW (ref 3.2–6.5)
Beta Globulin: 4.5 % — ABNORMAL LOW (ref 4.7–7.2)
Gamma Globulin: 28 % — ABNORMAL HIGH (ref 11.1–18.8)

## 2013-03-20 LAB — IFE INTERPRETATION

## 2013-03-20 LAB — KAPPA/LAMBDA LIGHT CHAINS: Kappa free light chain: 45 mg/dL — ABNORMAL HIGH (ref 0.33–1.94)

## 2013-03-21 ENCOUNTER — Ambulatory Visit (INDEPENDENT_AMBULATORY_CARE_PROVIDER_SITE_OTHER): Payer: BC Managed Care – PPO | Admitting: Family

## 2013-03-21 ENCOUNTER — Encounter: Payer: Self-pay | Admitting: Family

## 2013-03-21 VITALS — BP 120/86 | HR 100 | Temp 98.3°F | Resp 16 | Wt 203.0 lb

## 2013-03-21 DIAGNOSIS — I2510 Atherosclerotic heart disease of native coronary artery without angina pectoris: Secondary | ICD-10-CM

## 2013-03-21 DIAGNOSIS — R109 Unspecified abdominal pain: Secondary | ICD-10-CM

## 2013-03-21 NOTE — Patient Instructions (Addendum)
Please follow up in 6 weeks. Come fasting to this appointment.

## 2013-03-21 NOTE — Progress Notes (Signed)
Subjective:    Patient ID: Robert Berger, male    DOB: [birth year], 71 y.o.   MRN: [id number]  HPI  Robert Berger is a 72 yr old male who presents today for ED follow up.  He was seen on 8/10 with severe abdominal pain. ED records are reviewed. The pt underwent a CT of the abdomen and pelvis which made noted of a parastomal hernia (hx of sigmoidectomy) without obstruction or incarceration.  Note was also made of partial bowel wall thickening.  CAD was noted per CT. He was given pain medication in the ED and symptoms improved. He was discharged to home.  He did have severe itching after his ED visit. He is not sure what caused this pain.  He took a benadryl tablet which caused drowsiness. Reports that itching has slowing been going away.    He reports that since returning home he is feeling well. Reports that he has rx for percocet but has not needed to use.  Reports that he is tolerating PO's. Reports that stools range from soft to "only constipated."  He is using dulcolax prn.  He has miralax for prn use.    He reports that he is tapering off on the tramadol and reports that he is tolerating without worsening pain.    Review of Systems See HPI  Past Medical History  Diagnosis Date  . Arthritis   . GERD (gastroesophageal reflux disease)   . Complication of anesthesia     aspiration during surgery, difficult to wake up  . Multiple myeloma   . Status post radiation therapy 03/23/12 - 04/05/12    T8 - L4/ 20 Gy/ 10 Fractions  . Dysphagia 05/06/12    ED Visit    History   Social History  . Marital Status: Significant Other    Spouse Name: N/A    Number of Children: 3  . Years of Education: 14   Occupational History  . Games developer, on medical leave    Social History Main Topics  . Smoking status: Former Smoker    Types: Cigarettes    Quit date: 03/09/1978  . Smokeless tobacco: Never Used  . Alcohol Use: Yes     Comment: 1 glass of wine or a burbon daily., none in the  last 3 months  . Drug Use: No  . Sexual Activity: No   Other Topics Concern  . Not on file   Social History Narrative   Divorced x 2, lives with male partner (x 20 years). Normally able to ambulate without assistance.   2 children- both daughters who live in [place]   Completed some college   Enjoys computers, camping trailer.      Past Surgical History  Procedure Laterality Date  . Hemorrhoid surgery    . Laparotomy  05/14/2012    Procedure: EXPLORATORY LAPAROTOMY;  Surgeon: Velora Heckler, MD;  Location: [facility];  Service: General;  Laterality: N/A;  . Colostomy  05/14/2012    Procedure: COLOSTOMY;  Surgeon: Velora Heckler, MD;  Location: [facility];  Service: General;  Laterality: N/A;  . Back surgery      47 years ago  . Fracture surgery      ankle    Family History  Problem Relation Age of Onset  . Cancer Maternal Grandfather   . Cancer Maternal Grandmother     Allergies  Allergen Reactions  . Penicillins Nausea And Vomiting, Swelling and Rash    Current Outpatient Prescriptions on File  Prior to Visit  Medication Sig Dispense Refill  . baclofen (LIORESAL) 10 MG tablet Take 1 tablet (10 mg total) by mouth 3 (three) times daily.  90 each  5  . bisacodyl (DULCOLAX) 5 MG EC tablet Take 1 tablet (5 mg total) by mouth daily as needed for constipation.  14 tablet  0  . diltiazem (CARDIZEM CD) 120 MG 24 hr capsule Take 1 capsule (120 mg total) by mouth every morning.  30 capsule  5  . lansoprazole (PREVACID) 30 MG capsule Take 1 capsule (30 mg total) by mouth every morning.  30 capsule  5  . lisinopril (PRINIVIL,ZESTRIL) 2.5 MG tablet Take 2.5 mg by mouth daily.       . methocarbamol (ROBAXIN) 500 MG tablet Take 500-1,000 mg by mouth 3 (three) times daily as needed (spasm/ pain). For pain/spasms      . ondansetron (ZOFRAN-ODT) 4 MG disintegrating tablet Take 4 mg by mouth every 12 (twelve) hours as needed for nausea.       Marland Kitchen oxyCODONE-acetaminophen (PERCOCET) 5-325 MG per tablet  Take 1 tablet by mouth every 4 (four) hours as needed for pain.  20 tablet  0  . polyethylene glycol (MIRALAX / GLYCOLAX) packet Take 17 g by mouth daily as needed (constipation).       . Probiotic Product (PROBIOTIC DAILY PO) Take 1 tablet by mouth every morning.       . Rivaroxaban (XARELTO) 20 MG TABS Take 1 tablet (20 mg total) by mouth every morning.  30 tablet  5  . traMADol (ULTRAM) 50 MG tablet Take 2 tablets (100 mg total) by mouth 2 (two) times daily.  120 tablet  2  . pomalidomide (POMALYST) 3 MG capsule Take 1 capsule (3 mg total) by mouth daily. Take with water on days 1-21. Repeat every 28 days.  21 capsule  0   No current facility-administered medications on file prior to visit.    BP 120/86  Pulse 100  Temp(Src) 98.3 F (36.8 C) (Oral)  Resp 16  Wt 203 lb (92.08 kg)  BMI 30.87 kg/m2  SpO2 97%       Objective:   Physical Exam  Constitutional: He is oriented to person, place, and time. He appears well-developed and well-nourished. No distress.  HENT:  Head: Normocephalic and atraumatic.  Cardiovascular: Normal rate and regular rhythm.   No murmur heard. Pulmonary/Chest: Effort normal and breath sounds normal. No respiratory distress. He has no wheezes. He has no rales. He exhibits no tenderness.  Abdominal: Soft. Bowel sounds are normal. He exhibits no distension and no mass. There is no tenderness. There is no rebound and no guarding.  Neurological: He is alert and oriented to person, place, and time.  Psychiatric: He has a normal mood and affect. His behavior is normal. Judgment and thought content normal.          Assessment & Plan:  Plan flp next visit.

## 2013-03-21 NOTE — Assessment & Plan Note (Signed)
Resolved. Monitor.  

## 2013-03-21 NOTE — Assessment & Plan Note (Addendum)
Noted on CT.  Recommended that he add a baby aspirin for cardiac protection.

## 2013-04-02 ENCOUNTER — Telehealth: Payer: Self-pay | Admitting: Family

## 2013-04-02 NOTE — Telephone Encounter (Signed)
Patient Information:  Caller Name: Artur  Phone: [phone]  Patient: Robert Berger, Robert Berger  Gender: Male  DOB: 08-10-1940  Age: 72 Years  PCP: Sandford Craze (Adults only)  Office Follow Up:  Does the office need to follow up with this patient?: No  Instructions For The Office: N/A   Symptoms  Reason For Call & Symptoms: Has colostomy since October 2013. Has felt pressure in rectum 8-25 like "needs to have a bowel movement". Can feel "hard stool" in rectal vault.  Reviewed Health History In EMR: Yes  Reviewed Medications In EMR: Yes  Reviewed Allergies In EMR: Yes  Reviewed Surgeries / Procedures: Yes  Date of Onset of Symptoms: 04/02/2013  Guideline(s) Used:  Rectal Symptoms  Disposition Per Guideline:   See Today or Tomorrow in Office  Reason For Disposition Reached:   Patient wants to be seen  Advice Given:  N/A  Patient Will Follow Care Advice:  YES  Appointment Scheduled:  04/03/2013 11:00:00 Appointment Scheduled Provider:  Sandford Craze (Adults only)

## 2013-04-03 ENCOUNTER — Other Ambulatory Visit: Payer: Self-pay | Admitting: *Deleted

## 2013-04-03 ENCOUNTER — Encounter: Payer: Self-pay | Admitting: Family

## 2013-04-03 ENCOUNTER — Ambulatory Visit (INDEPENDENT_AMBULATORY_CARE_PROVIDER_SITE_OTHER): Payer: BC Managed Care – PPO | Admitting: Family

## 2013-04-03 VITALS — BP 120/80 | HR 96 | Temp 97.9°F | Resp 18 | Wt 199.0 lb

## 2013-04-03 DIAGNOSIS — K6289 Other specified diseases of anus and rectum: Secondary | ICD-10-CM

## 2013-04-03 MED ORDER — HYDROCORTISONE ACE-PRAMOXINE 1-1 % RE FOAM: 1.0000 | Freq: Two times a day (BID) | RECTAL | Status: DC

## 2013-04-03 NOTE — Telephone Encounter (Signed)
Faxed rx for Biologics to start Revlimid.

## 2013-04-03 NOTE — Patient Instructions (Addendum)
Please call if symptoms worsen. We will arrange an appointment with Dr. Silvano Rusk or one of his associates.

## 2013-04-03 NOTE — Assessment & Plan Note (Signed)
He is noted to have hemorrhoids and I have given him rx for proctofoam HC. In addition, I have advised him on sitz baths.  I am more concerned however that given his significant discomfort and erythema noted on the left buttock, that he could in fact have a perirectal abscess which is beginning to extend outward to the right buttock.  Will refer for surgical consult.

## 2013-04-03 NOTE — Progress Notes (Signed)
Subjective:    Patient ID: Robert Berger, male    DOB: 1941/07/21, 72 y.o.   MRN: [id number]  HPI  Robert Berger is a 72 yr old male who presents today with chief complaint of rectal pain.  Reports that the pain started 3 weeks ago. Notes rectal pressure- worse with urination.  Happended again yesterday while showering.  He has previous hx of hemorrhoids in the past.  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  Noted hard matter in the rectal vault.  Denies BPRBPR.    Review of Systems    see HPI  Past Medical History  Diagnosis Date  . Arthritis   . GERD (gastroesophageal reflux disease)   . Complication of anesthesia     aspiration during surgery, difficult to wake up  . Multiple myeloma   . Status post radiation therapy 03/23/12 - 04/05/12    T8 - L4/ 20 Gy/ 10 Fractions  . Dysphagia 05/06/12    ED Visit    History   Social History  . Marital Status: Significant Other    Spouse Name: N/A    Number of Children: 3  . Years of Education: 14   Occupational History  . Games developer, on medical leave    Social History Main Topics  . Smoking status: Former Smoker    Types: Cigarettes    Quit date: 03/09/1978  . Smokeless tobacco: Never Used  . Alcohol Use: Yes     Comment: 1 glass of wine or a burbon daily., none in the last 3 months  . Drug Use: No  . Sexual Activity: No   Other Topics Concern  . Not on file   Social History Narrative   Divorced x 2, lives with male partner (x 20 years). Normally able to ambulate without assistance.   2 children- both daughters who live in [place]   Completed some college   Enjoys computers, camping trailer.      Past Surgical History  Procedure Laterality Date  . Hemorrhoid surgery    . Laparotomy  05/14/2012    Procedure: EXPLORATORY LAPAROTOMY;  Surgeon: Velora Heckler, MD;  Location: [facility];  Service: General;  Laterality: N/A;  . Colostomy  05/14/2012    Procedure: COLOSTOMY;  Surgeon: Velora Heckler, MD;  Location: [facility];  Service:  General;  Laterality: N/A;  . Back surgery      47 years ago  . Fracture surgery      ankle    Family History  Problem Relation Age of Onset  . Cancer Maternal Grandfather   . Cancer Maternal Grandmother     Allergies  Allergen Reactions  . Penicillins Nausea And Vomiting, Swelling and Rash    Current Outpatient Prescriptions on File Prior to Visit  Medication Sig Dispense Refill  . aspirin 81 MG tablet Take 81 mg by mouth daily.      . baclofen (LIORESAL) 10 MG tablet Take 1 tablet (10 mg total) by mouth 3 (three) times daily.  90 each  5  . bisacodyl (DULCOLAX) 5 MG EC tablet Take 1 tablet (5 mg total) by mouth daily as needed for constipation.  14 tablet  0  . diltiazem (CARDIZEM CD) 120 MG 24 hr capsule Take 1 capsule (120 mg total) by mouth every morning.  30 capsule  5  . lansoprazole (PREVACID) 30 MG capsule Take 1 capsule (30 mg total) by mouth every morning.  30 capsule  5  . lisinopril (PRINIVIL,ZESTRIL) 2.5 MG tablet Take  2.5 mg by mouth daily.       . methocarbamol (ROBAXIN) 500 MG tablet Take 500-1,000 mg by mouth 3 (three) times daily as needed (spasm/ pain). For pain/spasms      . ondansetron (ZOFRAN-ODT) 4 MG disintegrating tablet Take 4 mg by mouth every 12 (twelve) hours as needed for nausea.       Marland Kitchen oxyCODONE-acetaminophen (PERCOCET) 5-325 MG per tablet Take 1 tablet by mouth every 4 (four) hours as needed for pain.  20 tablet  0  . polyethylene glycol (MIRALAX / GLYCOLAX) packet Take 17 g by mouth daily as needed (constipation).       . Probiotic Product (PROBIOTIC DAILY PO) Take 1 tablet by mouth every morning.       . Rivaroxaban (XARELTO) 20 MG TABS Take 1 tablet (20 mg total) by mouth every morning.  30 tablet  5   No current facility-administered medications on file prior to visit.    BP 120/80  Pulse 96  Temp(Src) 97.9 F (36.6 C) (Oral)  Resp 18  Wt 199 lb (90.266 kg)  BMI 30.26 kg/m2  SpO2 99%    Objective:   Physical Exam  Constitutional:  He is oriented to person, place, and time. He appears well-developed and well-nourished. No distress.  Genitourinary:  + external and internal hemorrhoids palpated. Some scant  blood noted in rectal vault. Pt extremely uncomfortable during exam.  + area of erythema noted on the left buttock.    Neurological: He is alert and oriented to person, place, and time.  Psychiatric: He has a normal mood and affect. His behavior is normal. Judgment and thought content normal.          Assessment & Plan:

## 2013-04-04 ENCOUNTER — Ambulatory Visit (INDEPENDENT_AMBULATORY_CARE_PROVIDER_SITE_OTHER): Payer: BC Managed Care – PPO | Admitting: General Surgery

## 2013-04-04 ENCOUNTER — Encounter (INDEPENDENT_AMBULATORY_CARE_PROVIDER_SITE_OTHER): Payer: Self-pay | Admitting: General Surgery

## 2013-04-04 ENCOUNTER — Encounter: Payer: Self-pay | Admitting: *Deleted

## 2013-04-04 VITALS — BP 118/92 | HR 105 | Temp 97.9°F | Resp 16 | Ht 68.0 in | Wt 198.2 lb

## 2013-04-04 DIAGNOSIS — K5641 Fecal impaction: Secondary | ICD-10-CM

## 2013-04-04 NOTE — Patient Instructions (Addendum)
Try glycerin suppositories.   If ineffective, try enema.  Both are over the counter.  Get enema with nozzle.    Follow up with Dr. Gerrit Friends if symptoms do not improve.

## 2013-04-04 NOTE — Assessment & Plan Note (Signed)
I think the source of the patient's rectal pain with urination is probably from impacted mucous balls in his rectal vault. Since his fecal stream is diverted with the ostomy, has some mucous balls that have not been evacuated. I attempted to evacuate these with anscopy but was then able to grasp the mucous ball.    He is advised to try glycerin suppositories and if these do not work to try an enema.  He certainly has internal and external hemorrhoids but these are noninflamed and do not appear to be the cause of his rectal pain. I cannot find evidence of a perirectal abscess.

## 2013-04-04 NOTE — Progress Notes (Signed)
HISTORY: Patient is a 72 year old male who presents with rectal pain for approximately 3 weeks. He mostly complains of rectal pain when he urinates. He denies any fevers or chills. 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  His hemorrhoids have been improved since that time.  He has had a long history of hemorrhoids for many years. He required hemorrhoid surgery a long time ago, but his hemorrhoids came back.  He has been placing Preparation H on the perianal region. While he was in the shower, he tried to examine his anus and he felt something hard on the inside.   PERTINENT REVIEW OF SYSTEMS: Otherwise negative.    Filed Vitals:   04/04/13 1445  BP: 118/92  Pulse: 105  Temp: 97.9 F (36.6 C)  Resp: 16   Filed Weights   04/04/13 1445  Weight: 198 lb 3.2 oz (89.903 kg)     EXAM: Head: Normocephalic and atraumatic.  Eyes:  Conjunctivae are normal. Pupils are equal, round, and reactive to light. No scleral icterus.  Resp: No respiratory distress, normal effort. Neurological: Alert and oriented to person, place, and time. Coordination normal.  Skin: Skin is warm and dry. No rash noted. No diaphoretic. No erythema. No pallor.  Psychiatric: Normal mood and affect. Normal behavior. Judgment and thought content normal.  Rectal.  Small external hemorrhoids.  Digital exam is negative for mass.  There is a firm stool ball posteriorly.  Anoscopy demonstrated small internal hemorrhoids.  I was unable to grasp the stool ball.       ASSESSMENT AND PLAN:   Impacted mucus in rectum, s/p hartmann's procedure.   I think the source of the patient's rectal pain with urination is probably from impacted mucous balls in his rectal vault. Since his fecal stream is diverted with the ostomy, has some mucous balls that have not been evacuated. I attempted to evacuate these with anscopy but was then able to grasp the mucous ball.    He is advised to  try glycerin suppositories and if these do not work to try an enema.  He certainly has internal and external hemorrhoids but these are noninflamed and do not appear to be the cause of his rectal pain. I cannot find evidence of a perirectal abscess.      Maudry Diego, MD Surgical Oncology, General & Endocrine Surgery [facility]  Lemont Fillers., NP No ref. provider found

## 2013-04-08 ENCOUNTER — Encounter (HOSPITAL_BASED_OUTPATIENT_CLINIC_OR_DEPARTMENT_OTHER): Payer: Self-pay

## 2013-04-08 ENCOUNTER — Emergency Department (HOSPITAL_BASED_OUTPATIENT_CLINIC_OR_DEPARTMENT_OTHER)
Admission: EM | Admit: 2013-04-08 | Discharge: 2013-04-09 | Disposition: A | Discharge status: Home / Self Care | Payer: BC Managed Care – PPO | Attending: Emergency Medicine | Admitting: Emergency Medicine

## 2013-04-08 DIAGNOSIS — Z79899 Other long term (current) drug therapy: Secondary | ICD-10-CM | POA: Insufficient documentation

## 2013-04-08 DIAGNOSIS — Z880 Allergy status to penicillin: Secondary | ICD-10-CM | POA: Insufficient documentation

## 2013-04-08 DIAGNOSIS — Z7982 Long term (current) use of aspirin: Secondary | ICD-10-CM | POA: Insufficient documentation

## 2013-04-08 DIAGNOSIS — L0291 Cutaneous abscess, unspecified: Secondary | ICD-10-CM

## 2013-04-08 DIAGNOSIS — R509 Fever, unspecified: Secondary | ICD-10-CM | POA: Insufficient documentation

## 2013-04-08 DIAGNOSIS — K219 Gastro-esophageal reflux disease without esophagitis: Secondary | ICD-10-CM | POA: Insufficient documentation

## 2013-04-08 DIAGNOSIS — M129 Arthropathy, unspecified: Secondary | ICD-10-CM | POA: Insufficient documentation

## 2013-04-08 DIAGNOSIS — Z87891 Personal history of nicotine dependence: Secondary | ICD-10-CM | POA: Insufficient documentation

## 2013-04-08 DIAGNOSIS — Z5189 Encounter for other specified aftercare: Secondary | ICD-10-CM | POA: Insufficient documentation

## 2013-04-08 DIAGNOSIS — Z8589 Personal history of malignant neoplasm of other organs and systems: Secondary | ICD-10-CM | POA: Insufficient documentation

## 2013-04-08 DIAGNOSIS — L02419 Cutaneous abscess of limb, unspecified: Secondary | ICD-10-CM | POA: Insufficient documentation

## 2013-04-08 LAB — COMPREHENSIVE METABOLIC PANEL
BUN: 10 mg/dL (ref 6–23)
CO2: 26 mEq/L (ref 19–32)
Chloride: 102 mEq/L (ref 96–112)
Creatinine, Ser: 0.9 mg/dL (ref 0.50–1.35)
GFR calc Af Amer: >90 mL/min (ref >90)
GFR calc non Af Amer: 83 mL/min — ABNORMAL LOW (ref >90)
Total Bilirubin: 0.7 mg/dL (ref 0.3–1.2)

## 2013-04-08 LAB — CBC WITH DIFFERENTIAL/PLATELET
Eosinophils Absolute: 0.1 10*3/uL (ref 0.0–0.7)
Eosinophils Relative: 1 % (ref 0–5)
Lymphs Abs: 2.2 10*3/uL (ref 0.7–4.0)
MCH: 32.1 pg (ref 26.0–34.0)
MCHC: 33.8 g/dL (ref 30.0–36.0)
MCV: 94.9 fL (ref 78.0–100.0)
Monocytes Relative: 25 % — ABNORMAL HIGH (ref 3–12)
Platelets: 111 10*3/uL — ABNORMAL LOW (ref 150–400)
RBC: 3.71 MIL/uL — ABNORMAL LOW (ref 4.22–5.81)

## 2013-04-08 MED ORDER — CLINDAMYCIN PHOSPHATE 600 MG/50ML IV SOLN
600.0000 mg | Freq: Once | INTRAVENOUS | Status: DC
  Filled 2013-04-08: qty 50

## 2013-04-08 MED ORDER — SULFAMETHOXAZOLE-TRIMETHOPRIM 800-160 MG PO TABS: 1.0000 | ORAL_TABLET | Freq: Two times a day (BID) | ORAL | Status: DC

## 2013-04-08 MED ORDER — OXYCODONE-ACETAMINOPHEN 5-325 MG PO TABS: 2.0000 | ORAL_TABLET | ORAL | Status: DC | PRN

## 2013-04-08 MED ORDER — HYDROMORPHONE HCL PF 1 MG/ML IJ SOLN
1.0000 mg | Freq: Once | INTRAMUSCULAR | Status: AC
  Administered 2013-04-08: 1 mg via INTRAVENOUS
  Filled 2013-04-08: qty 1

## 2013-04-08 MED ORDER — HYDROMORPHONE HCL PF 1 MG/ML IJ SOLN
1.0000 mg | Freq: Once | INTRAMUSCULAR | Status: AC
  Administered 2013-04-09: 1 mg via INTRAVENOUS
  Filled 2013-04-08: qty 1

## 2013-04-08 MED ORDER — VANCOMYCIN HCL IN DEXTROSE 1-5 GM/200ML-% IV SOLN
1000.0000 mg | Freq: Once | INTRAVENOUS | Status: AC
  Administered 2013-04-08: 1000 mg via INTRAVENOUS
  Filled 2013-04-08: qty 200

## 2013-04-08 NOTE — ED Provider Notes (Signed)
TIME SEEN: 9:30 PM  CHIEF COMPLAINT: Hip abscess, fever  HPI: Patient is a 72 y.o. male with a history of multiple myeloma not currently on chemotherapy, prior perforated diverticulitis status post resection and colostomy who presents the emergency department with an abscess to his left anterior hip for the past week. He reports it is progressively gotten more red, warm and painful. He's had a fever of 100.0 at home. No nausea, vomiting or diarrhea. He is having good ostomy output. Normal appetite. He has had multiple prior abscess ease in the past. Patient reports he has some pain with moving the right hip but it does not feel to be deep inside the joint but rather pain on the anterior surface.  ROS: See HPI Constitutional:  fever  Eyes: no drainage  ENT: no runny nose   Cardiovascular:  no chest pain  Resp: no SOB  GI: no vomiting GU: no dysuria Integumentary: no rash  Allergy: no hives  Musculoskeletal: no leg swelling  Neurological: no slurred speech ROS otherwise negative  PAST MEDICAL HISTORY/PAST SURGICAL HISTORY:  Past Medical History  Diagnosis Date  . Arthritis   . GERD (gastroesophageal reflux disease)   . Complication of anesthesia     aspiration during surgery, difficult to wake up  . Multiple myeloma   . Status post radiation therapy 03/23/12 - 04/05/12    T8 - L4/ 20 Gy/ 10 Fractions  . Dysphagia 05/06/12    ED Visit    MEDICATIONS:  Prior to Admission medications   Medication Sig Start Date End Date Taking? Authorizing Provider  aspirin 81 MG tablet Take 81 mg by mouth daily.    Historical Provider, MD  baclofen (LIORESAL) 10 MG tablet Take 1 tablet (10 mg total) by mouth 3 (three) times daily. 02/26/13   Sandford Craze, NP  bisacodyl (DULCOLAX) 5 MG EC tablet Take 1 tablet (5 mg total) by mouth daily as needed for constipation. 02/17/13   Trixie Dredge, PA-C  diltiazem (CARDIZEM CD) 120 MG 24 hr capsule Take 1 capsule (120 mg total) by mouth every morning. 02/26/13    Sandford Craze, NP  hydrocortisone-pramoxine (PROCTOFOAM HC) rectal foam Place 1 applicator rectally 2 (two) times daily. 04/03/13   Sandford Craze, NP  lansoprazole (PREVACID) 30 MG capsule Take 1 capsule (30 mg total) by mouth every morning. 02/26/13   Sandford Craze, NP  lisinopril (PRINIVIL,ZESTRIL) 2.5 MG tablet Take 2.5 mg by mouth daily.  01/30/13   Historical Provider, MD  methocarbamol (ROBAXIN) 500 MG tablet Take 500-1,000 mg by mouth 3 (three) times daily as needed (spasm/ pain). For pain/spasms 12/19/12   [name], MD  ondansetron (ZOFRAN-ODT) 4 MG disintegrating tablet Take 4 mg by mouth every 12 (twelve) hours as needed for nausea.     Historical Provider, MD  oxyCODONE-acetaminophen (PERCOCET) 5-325 MG per tablet Take 1 tablet by mouth every 4 (four) hours as needed for pain. 03/19/13   Vida Roller, MD  polyethylene glycol [facility] Ethelene Hal) packet Take 17 g by mouth daily as needed (constipation).     Historical Provider, MD  Probiotic Product (PROBIOTIC DAILY PO) Take 1 tablet by mouth every morning.     Historical Provider, MD  Rivaroxaban (XARELTO) 20 MG TABS Take 1 tablet (20 mg total) by mouth every morning. 02/26/13   Sandford Craze, NP  traMADol (ULTRAM) 50 MG tablet Take 50 mg by mouth 2 (two) times daily. 02/26/13   Sandford Craze, NP    ALLERGIES:  Allergies  Allergen Reactions  . Penicillins Nausea And Vomiting, Swelling and Rash    SOCIAL HISTORY:  History  Substance Use Topics  . Smoking status: Former Smoker    Types: Cigarettes    Quit date: 03/09/1978  . Smokeless tobacco: Never Used  . Alcohol Use: Yes     Comment: 1 glass of wine or a burbon daily., none in the last 3 months    FAMILY HISTORY: Family History  Problem Relation Age of Onset  . Cancer Maternal Grandfather   . Cancer Maternal Grandmother     EXAM: BP 137/77  Pulse 98  Temp(Src) 98.3 F (36.8 C)  Resp 18  SpO2 97% CONSTITUTIONAL: Alert and oriented  and responds appropriately to questions. Well-appearing; well-nourished, nontoxic, making jokes HEAD: Normocephalic EYES: Conjunctivae clear, PERRL ENT: normal nose; no rhinorrhea; moist mucous membranes; pharynx without lesions noted NECK: Supple, no meningismus, no LAD  CARD: RRR; S1 and S2 appreciated; no murmurs, no clicks, no rubs, no gallops RESP: Normal chest excursion without splinting or tachypnea; breath sounds clear and equal bilaterally; no wheezes, no rhonchi, no rales,  ABD/GI: Normal bowel sounds; non-distended; soft, non-tender, no rebound, no guarding, colostomy present in the left lower quadrant that is pain and pain BACK:  The back appears normal and is non-tender to palpation, there is no CVA tenderness EXT: Patient has a 15 x 30 cm area of erythema, induration and warmth over the left anterior hip and a 4 x 2 cm fluctuant area that is draining purulent material, no obvious joint effusion, normal range of motion in his left hip, no pain with internal and external rotation, 2+ DP pulses bilaterally, sensation to light touch intact diffusely otherwise Normal ROM in all joints; non-tender to palpation; no edema; normal capillary refill; no cyanosis    SKIN: Normal color for age and race; warm NEURO: Moves all extremities equally PSYCH: The patient's mood and manner are appropriate. Grooming and personal hygiene are appropriate.  MEDICAL DECISION MAKING: Patient with a soft tissue abscess and surrounding cellulitis to his left anterior hip. There is no signs of septic arthritis. He's otherwise well-appearing with normal vital signs. Given his comorbidities and age, will obtain basic labs and cultures. We'll give IV antibiotics. Will incise and drain abscess and place packing material.  ED PROGRESS: Patient unable to tolerate incision and drainage after Dilaudid given IV and infiltrated area with 15 mL of 2% Xylocaine. He refuses to allow me to attempt to open the abscess any further  or place packing. Will have him followup with his primary care physician on Tuesday. Will give IV antibiotics in the ED. I have outlined the area of erythema and given return precautions.  INCISION AND DRAINAGE Date/Time: 04/08/2013 10:58 PM Performed by: Raelyn Number Authorized by: Raelyn Number Consent: Verbal consent obtained. Risks and benefits: risks, benefits and alternatives were discussed Consent given by: patient Type: abscess Body area: lower extremity Location details: left hip Anesthesia: local infiltration Local anesthetic: lidocaine 2% without epinephrine Anesthetic total: 15 ml Patient sedated: no Scalpel size: 11 Incision type: single straight Complexity: complex Drainage: purulent Drainage amount: scant Wound treatment: wound left open Packing material: none Patient tolerance: Patient able to tolerate the procedure secondary to pain, no immediate complications   11:37 PM  Pt labs are unremarkable. No leukocytosis. He still hemodynamically stable. We'll discharge home with prescription for Bactrim. Given return precautions. We'll have him followup with his primary care physician in the next 48 hours for reevaluation. Patient  verbalizes understanding is comfortable with this plan.  Layla Maw Holly Pring, DO 04/08/13 2338

## 2013-04-08 NOTE — ED Notes (Signed)
Patient here with possible abscess to left groin with drainage x 1 week, reports pain to same with fever

## 2013-04-09 NOTE — ED Notes (Signed)
D/c home with ride- rx x 2 given for septra and percocet

## 2013-04-11 ENCOUNTER — Ambulatory Visit (INDEPENDENT_AMBULATORY_CARE_PROVIDER_SITE_OTHER): Payer: BC Managed Care – PPO | Admitting: Physician Assistant

## 2013-04-11 ENCOUNTER — Encounter: Payer: Self-pay | Admitting: Physician Assistant

## 2013-04-11 VITALS — BP 106/72 | HR 95 | Temp 97.5°F | Resp 16 | Wt 197.2 lb

## 2013-04-11 DIAGNOSIS — L0291 Cutaneous abscess, unspecified: Secondary | ICD-10-CM

## 2013-04-11 NOTE — Assessment & Plan Note (Signed)
Abscess resolving.  Instructed patient on proper wound care and dressing.  Patient still with moderate cellulitis of L inguinal region.  Patient instructed to finish antibiotics, taking them as prescribed.  Patient to take analgesics for pain.  Follow-up in 1 week.  Patient educated on alarm symptoms and when to proceed to the ED.

## 2013-04-11 NOTE — Patient Instructions (Signed)
Please change dressing daily.  Continue antibiotic as prescribed until all pills are gone.  Percocet for pain.  If redness worsens are travels past the purple line, or if you develop fever, chills, nausea or vomiting, you need to proceed to the ER immediately.  I want to see you in 1 week to reassess.  Cellulitis Cellulitis is an infection of the skin and the tissue beneath it. The infected area is usually red and tender. Cellulitis occurs most often in the arms and lower legs.  CAUSES  Cellulitis is caused by bacteria that enter the skin through cracks or cuts in the skin. The most common types of bacteria that cause cellulitis are Staphylococcus and Streptococcus. SYMPTOMS   Redness and warmth.  Swelling.  Tenderness or pain.  Fever. DIAGNOSIS  Your caregiver can usually determine what is wrong based on a physical exam. Blood tests may also be done. TREATMENT  Treatment usually involves taking an antibiotic medicine. HOME CARE INSTRUCTIONS   Take your antibiotics as directed. Finish them even if you start to feel better.  Keep the infected arm or leg elevated to reduce swelling.  Apply a warm cloth to the affected area up to 4 times per day to relieve pain.  Only take over-the-counter or prescription medicines for pain, discomfort, or fever as directed by your caregiver.  Keep all follow-up appointments as directed by your caregiver. SEEK MEDICAL CARE IF:   You notice red streaks coming from the infected area.  Your red area gets larger or turns dark in color.  Your bone or joint underneath the infected area becomes painful after the skin has healed.  Your infection returns in the same area or another area.  You notice a swollen bump in the infected area.  You develop new symptoms. SEEK IMMEDIATE MEDICAL CARE IF:   You have a fever.  You feel very sleepy.  You develop vomiting or diarrhea.  You have a general ill feeling (malaise) with muscle aches and  pains. MAKE SURE YOU:   Understand these instructions.  Will watch your condition.  Will get help right away if you are not doing well or get worse. Document Released: 05/05/2005 Document Revised: 01/25/2012 Document Reviewed: 10/11/2011 [facility] Patient Information 2014 [place], [place].

## 2013-04-11 NOTE — Progress Notes (Signed)
Patient ID: Robert Berger, male   DOB: 05/16/1941, 72 y.o.   MRN: [id number]  Patient presents to clinic today for ED follow-up concerning abscess and cellulitis of left inguinal region.  Information was obtained from the patient.  Patient was seen in the ED on 04-08-13 c/o swollen, red and tender area on his left thigh that was draining pus.  Patient was given IV vancomycin and dilaudid.  Abscess was I/D, without packing placement. Patient was discharged with Rx for percocet and septra and told to follow-up with PCP.  Patient states he did not have his antibiotic filled until 04/09/13.  Since then he endorses taking medication as prescribed.  States the area has been draining pus and blood, but is somewhat less tender and the redness is subsiding.  Denies fevers, chills, sweats, N/V.  Is tolerating medication well.  Denies new or changing symptoms.  Past Medical History  Diagnosis Date  . Arthritis   . GERD (gastroesophageal reflux disease)   . Complication of anesthesia     aspiration during surgery, difficult to wake up  . Multiple myeloma   . Status post radiation therapy 03/23/12 - 04/05/12    T8 - L4/ 20 Gy/ 10 Fractions  . Dysphagia 05/06/12    ED Visit    Current Outpatient Prescriptions on File Prior to Visit  Medication Sig Dispense Refill  . aspirin 81 MG tablet Take 81 mg by mouth daily.      . baclofen (LIORESAL) 10 MG tablet Take 1 tablet (10 mg total) by mouth 3 (three) times daily.  90 each  5  . bisacodyl (DULCOLAX) 5 MG EC tablet Take 1 tablet (5 mg total) by mouth daily as needed for constipation.  14 tablet  0  . diltiazem (CARDIZEM CD) 120 MG 24 hr capsule Take 1 capsule (120 mg total) by mouth every morning.  30 capsule  5  . hydrocortisone-pramoxine (PROCTOFOAM HC) rectal foam Place 1 applicator rectally 2 (two) times daily.  10 g  0  . lansoprazole (PREVACID) 30 MG capsule Take 1 capsule (30 mg total) by mouth every morning.  30 capsule  5  . lisinopril (PRINIVIL,ZESTRIL)  2.5 MG tablet Take 2.5 mg by mouth daily.       . methocarbamol (ROBAXIN) 500 MG tablet Take 500-1,000 mg by mouth 3 (three) times daily as needed (spasm/ pain). For pain/spasms      . ondansetron (ZOFRAN-ODT) 4 MG disintegrating tablet Take 4 mg by mouth every 12 (twelve) hours as needed for nausea.       Marland Kitchen oxyCODONE-acetaminophen (PERCOCET) 5-325 MG per tablet Take 1 tablet by mouth every 4 (four) hours as needed for pain.  20 tablet  0  . polyethylene glycol (MIRALAX / GLYCOLAX) packet Take 17 g by mouth daily as needed (constipation).       . Probiotic Product (PROBIOTIC DAILY PO) Take 1 tablet by mouth every morning.       . Rivaroxaban (XARELTO) 20 MG TABS Take 1 tablet (20 mg total) by mouth every morning.  30 tablet  5  . sulfamethoxazole-trimethoprim (SEPTRA DS) 800-160 MG per tablet Take 1 tablet by mouth every 12 (twelve) hours.  14 tablet  0  . traMADol (ULTRAM) 50 MG tablet Take 50 mg by mouth 2 (two) times daily as needed.       Marland Kitchen oxyCODONE-acetaminophen (PERCOCET/ROXICET) 5-325 MG per tablet Take 2 tablets by mouth every 4 (four) hours as needed for pain.  20 tablet  0  No current facility-administered medications on file prior to visit.    Allergies  Allergen Reactions  . Penicillins Nausea And Vomiting, Swelling and Rash    Family History  Problem Relation Age of Onset  . Cancer Maternal Grandfather   . Cancer Maternal Grandmother     History   Social History  . Marital Status: Significant Other    Spouse Name: N/A    Number of Children: 3  . Years of Education: 14   Occupational History  . Games developer, on medical leave    Social History Main Topics  . Smoking status: Former Smoker    Types: Cigarettes    Quit date: 03/09/1978  . Smokeless tobacco: Never Used  . Alcohol Use: Yes     Comment: 1 glass of wine or a burbon daily., none in the last 3 months  . Drug Use: No  . Sexual Activity: No   Other Topics Concern  . None   Social History  Narrative   Divorced x 2, lives with male partner (x 20 years). Normally able to ambulate without assistance.   2 children- both daughters who live in [place]   Completed some college   Enjoys computers, camping trailer.     Review of Systems  Constitutional: Negative for fever, chills, weight loss and malaise/fatigue.  Gastrointestinal: Negative for diarrhea and constipation.  Neurological: Negative for dizziness and loss of consciousness.   Filed Vitals:   04/11/13 1024  BP: 106/72  Pulse: 95  Temp: 97.5 F (36.4 C)  Resp: 16   Physical Exam  Vitals reviewed. Constitutional: He is oriented to person, place, and time and well-developed, well-nourished, and in no distress.  HENT:  Head: Normocephalic and atraumatic.  Eyes: Conjunctivae are normal. Pupils are equal, round, and reactive to light.  Cardiovascular: Normal rate, regular rhythm, normal heart sounds and intact distal pulses.   Pulmonary/Chest: Effort normal and breath sounds normal.  Abdominal: Soft. Bowel sounds are normal.  Neurological: He is alert and oriented to person, place, and time. No cranial nerve deficit.  Skin: Skin is warm and dry.  Presence of a 2cm incision of left inguinal region at site of previous abscess.  No evidence of purulent drainage noted.  Skin is hardened surrounding site.  No fluctuance noted.   Recent Results (from the past 2160 hour(s))  HEMOGLOBIN A1C     Status: None   Collection Time    01/23/13  3:11 PM      Result Value Range   Hemoglobin A1C 5.4  <5.7 %   Comment:                                                                            According to the ADA Clinical Practice Recommendations for 2011, when     HbA1c is used as a screening test:             >=6.5%   Diagnostic of Diabetes Mellitus                (if abnormal result is confirmed)           5.7-6.4%   Increased risk of developing Diabetes Mellitus  References:Diagnosis and Classification of Diabetes  [place],Diabetes     Care,2011,34(Suppl 1):S62-S69 and Standards of Medical Care in             Diabetes - 2011,Diabetes Care,2011,34 (Suppl 1):S11-S61.         Mean Plasma Glucose 108  <117 mg/dL  CBC WITH DIFFERENTIAL ([facility])     Status: None   Collection Time    01/29/13  1:20 PM      Result Value Range   WBC 4.5  4.0 - 10.0 10e3/uL   RBC 5.12  4.20 - 5.70 10e6/uL   HGB 15.7  13.0 - 17.1 g/dL   HCT 10.2  72.5 - 36.6 %   MCV 91  82 - 98 fL   MCH 30.7  28.0 - 33.4 pg   MCHC 33.8  32.0 - 35.9 g/dL   RDW 44.0  34.7 - 42.5 %   Platelets 160  145 - 400 10e3/uL   NEUT# 2.7  1.5 - 6.5 10e3/uL   LYMPH# 1.3  0.9 - 3.3 10e3/uL   MONO# 0.4  0.1 - 0.9 10e3/uL   Eosinophils Absolute 0.1  0.0 - 0.5 10e3/uL   BASO# 0.0  0.0 - 0.2 10e3/uL   NEUT% 60.8  40.0 - 80.0 %   LYMPH% 27.9  14.0 - 48.0 %   MONO% 9.3  0.0 - 13.0 %   EOS% 1.8  0.0 - 7.0 %   BASO% 0.2  0.0 - 2.0 %  COMPREHENSIVE METABOLIC PANEL     Status: Abnormal   Collection Time    01/29/13  1:22 PM      Result Value Range   Sodium 137  135 - 145 mEq/L   Potassium 3.9  3.5 - 5.3 mEq/L   Chloride 104  96 - 112 mEq/L   CO2 21  19 - 32 mEq/L   Glucose, Bld 148 (*) 70 - 99 mg/dL   BUN 10  6 - 23 mg/dL   Creatinine, Ser 9.56  0.50 - 1.35 mg/dL   Total Bilirubin 0.8  0.3 - 1.2 mg/dL   Alkaline Phosphatase 55  39 - 117 U/L   AST 23  0 - 37 U/L   ALT 20  0 - 53 U/L   Total Protein 6.9  6.0 - 8.3 g/dL   Albumin 3.7  3.5 - 5.2 g/dL   Calcium 9.2  8.4 - 38.7 mg/dL  KAPPA/LAMBDA LIGHT CHAINS     Status: Abnormal   Collection Time    01/29/13  1:22 PM      Result Value Range   Kappa free light chain 8.77 (*) 0.33 - 1.94 mg/dL   Lambda Free Lght Chn 0.90  0.57 - 2.63 mg/dL   Kappa:Lambda Ratio 5.64 (*) 0.26 - 1.65  PROTEIN ELECTROPHORESIS, SERUM, WITH REFLEX     Status: Abnormal   Collection Time    01/29/13  1:22 PM      Result Value Range   Total Protein, Serum Electrophoresis 6.9  6.0 - 8.3 g/dL   Albumin ELP 33.2  (*) 55.8 - 66.1 %   Alpha-1-Globulin 4.1  2.9 - 4.9 %   Alpha-2-Globulin 14.0 (*) 7.1 - 11.8 %   Beta Globulin 5.4  4.7 - 7.2 %   Beta 2 4.5  3.2 - 6.5 %   Gamma Globulin 16.4  11.1 - 18.8 %   M-Spike, % 0.71     SPE Interp. *     Comment: A restricted band consistent  with monoclonal protein is present.The monoclonal protein peak accounts for 0.71 g/dL of the ZOXWR6.04 g/dL of protein in the gamma region.Results are consistent with SPE performed on 6/2/14Reflex testing will be performed as      indicated. Reviewed by Nehemiah Massed Mammarappallil MD (Electronic Signature on File)   COMMENT (PROTEIN ELECTROPHOR) *     Comment: ---------------Serum protein electrophoresis is a useful screening procedure in thedetection of various pathophysiologic states such as inflammation,gammopathies, protein loss and other dysproteinemias.  Immunofixationelectrophoresis (IFE) is a more      sensitive technique for theidentification of M-proteins found in patients with monoclonalgammopathy of unknown significance (MGUS), amyloidosis, early ortreated myeloma or macroglobulinemia, solitary plasmacytoma orextramedullary plasmacytoma.  IGG, IGA, IGM     Status: Abnormal   Collection Time    01/29/13  1:22 PM      Result Value Range   IgG (Immunoglobin G), Serum 1270  650 - 1600 mg/dL   IgA 26 (*) 68 - 540 mg/dL   IgM, Serum 50  41 - 981 mg/dL  IFE INTERPRETATION     Status: None   Collection Time    01/29/13  1:22 PM      Result Value Range   Immunofix Electr Int *     Comment: Monoclonal IgG kappa protein is present.Area of slightly restricted mobility in the IgG and lambda lanes.Suggest repeat in 6-8 months, if clinically indicated. Reviewed by Nehemiah Massed Mammarappallil MD (Electronic Signature on File)  CBC WITH DIFFERENTIAL     Status: Abnormal   Collection Time    02/05/13  1:45 AM      Result Value Range   WBC 5.7  4.0 - 10.5 K/uL   RBC 4.92  4.22 - 5.81 MIL/uL   Hemoglobin 14.8  13.0 - 17.0 g/dL   HCT  19.1  47.8 - 29.5 %   MCV 87.6  78.0 - 100.0 fL   MCH 30.1  26.0 - 34.0 pg   MCHC 34.3  30.0 - 36.0 g/dL   RDW 62.1  30.8 - 65.7 %   Platelets 139 (*) 150 - 400 K/uL   Neutrophils Relative % 72  43 - 77 %   Neutro Abs 4.1  1.7 - 7.7 K/uL   Lymphocytes Relative 18  12 - 46 %   Lymphs Abs 1.0  0.7 - 4.0 K/uL   Monocytes Relative 9  3 - 12 %   Monocytes Absolute 0.5  0.1 - 1.0 K/uL   Eosinophils Relative 1  0 - 5 %   Eosinophils Absolute 0.1  0.0 - 0.7 K/uL   Basophils Relative 0  0 - 1 %   Basophils Absolute 0.0  0.0 - 0.1 K/uL  COMPREHENSIVE METABOLIC PANEL     Status: Abnormal   Collection Time    02/05/13  1:45 AM      Result Value Range   Sodium 138  135 - 145 mEq/L   Potassium 3.9  3.5 - 5.1 mEq/L   Chloride 103  96 - 112 mEq/L   CO2 26  19 - 32 mEq/L   Glucose, Bld 143 (*) 70 - 99 mg/dL   BUN 7  6 - 23 mg/dL   Creatinine, Ser 8.46  0.50 - 1.35 mg/dL   Calcium 9.3  8.4 - 96.2 mg/dL   Total Protein 7.1  6.0 - 8.3 g/dL   Albumin 3.5  3.5 - 5.2 g/dL   AST 22  0 - 37 U/L   ALT 22  0 -  53 U/L   Alkaline Phosphatase 63  39 - 117 U/L   Total Bilirubin 0.6  0.3 - 1.2 mg/dL   GFR calc non Af Amer 88 (*) >90 mL/min   GFR calc Af Amer >90  >90 mL/min   Comment:            The eGFR has been calculated     using the CKD EPI equation.     This calculation has not been     validated in all clinical     situations.     eGFR's persistently     <90 mL/min signify     possible Chronic Kidney Disease.  LIPASE, BLOOD     Status: None   Collection Time    02/05/13  1:45 AM      Result Value Range   Lipase 27  11 - 59 U/L  CG4 I-STAT (LACTIC ACID)     Status: None   Collection Time    02/05/13  1:58 AM      Result Value Range   Lactic Acid, Venous 1.95  0.5 - 2.2 mmol/L  URINALYSIS, ROUTINE W REFLEX MICROSCOPIC     Status: None   Collection Time    02/05/13  4:30 AM      Result Value Range   Color, Urine YELLOW  YELLOW   APPearance CLEAR  CLEAR   Specific Gravity, Urine 1.028   1.005 - 1.030   pH 6.5  5.0 - 8.0   Glucose, UA NEGATIVE  NEGATIVE mg/dL   Hgb urine dipstick NEGATIVE  NEGATIVE   Bilirubin Urine NEGATIVE  NEGATIVE   Ketones, ur NEGATIVE  NEGATIVE mg/dL   Protein, ur NEGATIVE  NEGATIVE mg/dL   Urobilinogen, UA 1.0  0.0 - 1.0 mg/dL   Nitrite NEGATIVE  NEGATIVE   Leukocytes, UA NEGATIVE  NEGATIVE   Comment: MICROSCOPIC NOT DONE ON URINES WITH NEGATIVE PROTEIN, BLOOD, LEUKOCYTES, NITRITE, OR GLUCOSE <1000 mg/dL.  CBC WITH DIFFERENTIAL ([facility])     Status: Abnormal   Collection Time    02/15/13  3:25 PM      Result Value Range   WBC 2.5 (*) 4.0 - 10.0 10e3/uL   RBC 4.97  4.20 - 5.70 10e6/uL   HGB 15.2  13.0 - 17.1 g/dL   HCT 11.9  14.7 - 82.9 %   MCV 92  82 - 98 fL   MCH 30.6  28.0 - 33.4 pg   MCHC 33.2  32.0 - 35.9 g/dL   RDW 56.2  13.0 - 86.5 %   Platelets 163  145 - 400 10e3/uL   NEUT# 1.1 (*) 1.5 - 6.5 10e3/uL   LYMPH# 0.9  0.9 - 3.3 10e3/uL   MONO# 0.4  0.1 - 0.9 10e3/uL   Eosinophils Absolute 0.1  0.0 - 0.5 10e3/uL   BASO# 0.0  0.0 - 0.2 10e3/uL   NEUT% 43.2  40.0 - 80.0 %   LYMPH% 35.3  14.0 - 48.0 %   MONO% 17.5 (*) 0.0 - 13.0 %   EOS% 3.2  0.0 - 7.0 %   BASO% 0.8  0.0 - 2.0 %  COMPREHENSIVE METABOLIC PANEL     Status: Abnormal   Collection Time    02/15/13  3:25 PM      Result Value Range   Sodium 137  135 - 145 mEq/L   Potassium 4.4  3.5 - 5.3 mEq/L   Chloride 103  96 - 112 mEq/L   CO2 25  19 -  32 mEq/L   Glucose, Bld 100 (*) 70 - 99 mg/dL   BUN 9  6 - 23 mg/dL   Creatinine, Ser 9.56  0.50 - 1.35 mg/dL   Total Bilirubin 0.7  0.3 - 1.2 mg/dL   Alkaline Phosphatase 154 (*) 39 - 117 U/L   AST 29  0 - 37 U/L   ALT 35  0 - 53 U/L   Total Protein 6.9  6.0 - 8.3 g/dL   Albumin 3.8  3.5 - 5.2 g/dL   Calcium 9.0  8.4 - 21.3 mg/dL  PROTEIN ELECTROPHORESIS, SERUM, WITH REFLEX     Status: Abnormal   Collection Time    02/15/13  3:25 PM      Result Value Range   Total Protein, Serum Electrophoresis 6.9  6.0 - 8.3 g/dL    Albumin ELP 08.6 (*) 55.8 - 66.1 %   Alpha-1-Globulin 7.9 (*) 2.9 - 4.9 %   Alpha-2-Globulin 13.9 (*) 7.1 - 11.8 %   Beta Globulin 5.7  4.7 - 7.2 %   Beta 2 3.4  3.2 - 6.5 %   Gamma Globulin 18.9 (*) 11.1 - 18.8 %   M-Spike, % 1.01     SPE Interp. *     Comment: A restricted band consistent with monoclonal protein is present.The monoclonal protein peak accounts for 1.01 g/dL of the VHQIO9.62 g/dL of protein in the gamma region.Results are consistent with SPE performed on 6/24/14Reflex testing will be performed      as indicated.Reviewed by Dallas Breeding, MD, PhD, FCAP (Electronic Signature onFile)   COMMENT (PROTEIN ELECTROPHOR) *     Comment: ---------------Serum protein electrophoresis is a useful screening procedure in thedetection of various pathophysiologic states such as inflammation,gammopathies, protein loss and other dysproteinemias.  Immunofixationelectrophoresis (IFE) is a more      sensitive technique for theidentification of M-proteins found in patients with monoclonalgammopathy of unknown significance (MGUS), amyloidosis, early ortreated myeloma or macroglobulinemia, solitary plasmacytoma orextramedullary plasmacytoma.  KAPPA/LAMBDA LIGHT CHAINS     Status: Abnormal   Collection Time    02/15/13  3:25 PM      Result Value Range   Kappa free light chain 17.10 (*) 0.33 - 1.94 mg/dL   Lambda Free Lght Chn 0.68  0.57 - 2.63 mg/dL   Kappa:Lambda Ratio 95.28 (*) 0.26 - 1.65  IGG, IGA, IGM     Status: Abnormal   Collection Time    02/15/13  3:25 PM      Result Value Range   IgG (Immunoglobin G), Serum 1640 (*) 650 - 1600 mg/dL   IgA 21 (*) 68 - 413 mg/dL   IgM, Serum 37 (*) 41 - 251 mg/dL  IFE INTERPRETATION     Status: None   Collection Time    02/15/13  3:25 PM      Result Value Range   Immunofix Electr Int *     Comment: Monoclonal IgG kappa protein is present.Reviewed by Dallas Breeding, MD, PhD, FCAP (Electronic Signature onFile)  CBC WITH DIFFERENTIAL     Status:  Abnormal   Collection Time    02/17/13  7:58 AM      Result Value Range   WBC 3.5 (*) 4.0 - 10.5 K/uL   RBC 4.82  4.22 - 5.81 MIL/uL   Hemoglobin 14.4  13.0 - 17.0 g/dL   HCT 24.4  01.0 - 27.2 %   MCV 89.6  78.0 - 100.0 fL   MCH 29.9  26.0 - 34.0 pg  MCHC 33.3  30.0 - 36.0 g/dL   RDW 16.1  09.6 - 04.5 %   Platelets 146 (*) 150 - 400 K/uL   Neutrophils Relative % 74  43 - 77 %   Neutro Abs 2.6  1.7 - 7.7 K/uL   Lymphocytes Relative 14  12 - 46 %   Lymphs Abs 0.5 (*) 0.7 - 4.0 K/uL   Monocytes Relative 11  3 - 12 %   Monocytes Absolute 0.4  0.1 - 1.0 K/uL   Eosinophils Relative 1  0 - 5 %   Eosinophils Absolute 0.0  0.0 - 0.7 K/uL   Basophils Relative 0  0 - 1 %   Basophils Absolute 0.0  0.0 - 0.1 K/uL  COMPREHENSIVE METABOLIC PANEL     Status: Abnormal   Collection Time    02/17/13  7:58 AM      Result Value Range   Sodium 136  135 - 145 mEq/L   Potassium 4.2  3.5 - 5.1 mEq/L   Chloride 101  96 - 112 mEq/L   CO2 28  19 - 32 mEq/L   Glucose, Bld 123 (*) 70 - 99 mg/dL   BUN 6  6 - 23 mg/dL   Creatinine, Ser 4.09  0.50 - 1.35 mg/dL   Calcium 9.3  8.4 - 81.1 mg/dL   Total Protein 7.3  6.0 - 8.3 g/dL   Albumin 3.1 (*) 3.5 - 5.2 g/dL   AST 85 (*) 0 - 37 U/L   ALT 49  0 - 53 U/L   Alkaline Phosphatase 177 (*) 39 - 117 U/L   Total Bilirubin 2.3 (*) 0.3 - 1.2 mg/dL   GFR calc non Af Amer >90  >90 mL/min   GFR calc Af Amer >90  >90 mL/min   Comment:            The eGFR has been calculated     using the CKD EPI equation.     This calculation has not been     validated in all clinical     situations.     eGFR's persistently     <90 mL/min signify     possible Chronic Kidney Disease.  LIPASE, BLOOD     Status: Abnormal   Collection Time    02/17/13  7:58 AM      Result Value Range   Lipase 84 (*) 11 - 59 U/L  LACTIC ACID, PLASMA     Status: None   Collection Time    02/17/13  7:58 AM      Result Value Range   Lactic Acid, Venous 1.5  0.5 - 2.2 mmol/L  URINALYSIS,  ROUTINE W REFLEX MICROSCOPIC     Status: Abnormal   Collection Time    02/17/13  8:20 AM      Result Value Range   Color, Urine AMBER (*) YELLOW   Comment: BIOCHEMICALS MAY BE AFFECTED BY COLOR   APPearance CLEAR  CLEAR   Specific Gravity, Urine 1.022  1.005 - 1.030   pH 6.5  5.0 - 8.0   Glucose, UA NEGATIVE  NEGATIVE mg/dL   Hgb urine dipstick NEGATIVE  NEGATIVE   Bilirubin Urine SMALL (*) NEGATIVE   Ketones, ur NEGATIVE  NEGATIVE mg/dL   Protein, ur NEGATIVE  NEGATIVE mg/dL   Urobilinogen, UA 1.0  0.0 - 1.0 mg/dL   Nitrite NEGATIVE  NEGATIVE   Leukocytes, UA TRACE (*) NEGATIVE  URINE MICROSCOPIC-ADD ON     Status: None  Collection Time    02/17/13  8:20 AM      Result Value Range   WBC, UA 0-2  <3 WBC/hpf   Bacteria, UA RARE  RARE   Urine-Other MUCOUS PRESENT    CBC WITH DIFFERENTIAL ([facility])     Status: Abnormal   Collection Time    03/16/13  9:11 AM      Result Value Range   WBC 3.8 (*) 4.0 - 10.0 10e3/uL   RBC 4.51  4.20 - 5.70 10e6/uL   HGB 14.1  13.0 - 17.1 g/dL   HCT 62.9  52.8 - 41.3 %   MCV 93  82 - 98 fL   MCH 31.3  28.0 - 33.4 pg   MCHC 33.6  32.0 - 35.9 g/dL   RDW 24.4  01.0 - 27.2 %   Platelets 119 (*) 145 - 400 10e3/uL   NEUT# 2.2  1.5 - 6.5 10e3/uL   LYMPH# 0.9  0.9 - 3.3 10e3/uL   MONO# 0.6  0.1 - 0.9 10e3/uL   Eosinophils Absolute 0.1  0.0 - 0.5 10e3/uL   BASO# 0.0  0.0 - 0.2 10e3/uL   NEUT% 56.9  40.0 - 80.0 %   LYMPH% 24.0  14.0 - 48.0 %   MONO% 15.4 (*) 0.0 - 13.0 %   EOS% 3.4  0.0 - 7.0 %   BASO% 0.3  0.0 - 2.0 %  IGG, IGA, IGM     Status: Abnormal   Collection Time    03/16/13  9:11 AM      Result Value Range   IgG (Immunoglobin G), Serum 2660 (*) 650 - 1600 mg/dL   IgA 13 (*) 68 - 536 mg/dL   IgM, Serum 16 (*) 41 - 251 mg/dL  PROTEIN ELECTROPHORESIS, SERUM, WITH REFLEX     Status: Abnormal   Collection Time    03/16/13  9:11 AM      Result Value Range   Total Protein, Serum Electrophoresis 8.1  6.0 - 8.3 g/dL   Albumin ELP  64.4 (*) 55.8 - 66.1 %   Alpha-1-Globulin 3.9  2.9 - 4.9 %   Alpha-2-Globulin 12.8 (*) 7.1 - 11.8 %   Beta Globulin 4.5 (*) 4.7 - 7.2 %   Beta 2 2.9 (*) 3.2 - 6.5 %   Gamma Globulin 28.0 (*) 11.1 - 18.8 %   M-Spike, % 2.00     SPE Interp. *     Comment: A restricted band consistent with monoclonal protein is present.The monoclonal protein peak accounts for 2.00 g/dL of the IHKVQ2.59 g/dL of protein in the gamma region.Results are consistent with SPE performed on 7/11/14Reflex testing will be performed      as indicated. Reviewed by Dallas Breeding, MD, PhD, FCAP (Electronic Signature onFile)   COMMENT (PROTEIN ELECTROPHOR) *     Comment: ---------------Serum protein electrophoresis is a useful screening procedure in thedetection of various pathophysiologic states such as inflammation,gammopathies, protein loss and other dysproteinemias.  Immunofixationelectrophoresis (IFE) is a more      sensitive technique for theidentification of M-proteins found in patients with monoclonalgammopathy of unknown significance (MGUS), amyloidosis, early ortreated myeloma or macroglobulinemia, solitary plasmacytoma orextramedullary plasmacytoma.  KAPPA/LAMBDA LIGHT CHAINS     Status: Abnormal   Collection Time    03/16/13  9:11 AM      Result Value Range   Kappa free light chain 45.00 (*) 0.33 - 1.94 mg/dL   Lambda Free Lght Chn 0.57  0.57 - 2.63 mg/dL  Kappa:Lambda Ratio 78.95 (*) 0.26 - 1.65  COMPREHENSIVE METABOLIC PANEL ([facility] ONLY)     Status: Abnormal   Collection Time    03/16/13  9:11 AM      Result Value Range   Sodium 138  128 - 145 mEq/L   Potassium 4.0  3.3 - 4.7 mEq/L   Chloride 103  98 - 108 mEq/L   CO2 27  18 - 33 mEq/L   Glucose, Bld 148 (*) 73 - 118 mg/dL   BUN, Bld 6 (*) 7 - 22 mg/dL   Creat 0.9  0.6 - 1.2 mg/dl   Total Bilirubin 1.61  0.20 - 1.60 mg/dl   Alkaline Phosphatase 62  26 - 84 U/L   AST 31  11 - 38 U/L   ALT(SGPT) 29  10 - 47 U/L   Total Protein 8.1  6.4 - 8.1  g/dL   Albumin 3.2 (*) 3.3 - 5.5 g/dL   Calcium 8.8  8.0 - 09.6 mg/dL  IFE INTERPRETATION     Status: None   Collection Time    03/16/13  9:11 AM      Result Value Range   Immunofix Electr Int *     Comment: Monoclonal IgG kappa protein is present.Area of slightly restricted mobility in the IgG and lambda lanes.Suggest repeat in 6-8 months, if clinically indicated. Reviewed by Dallas Breeding, MD, PhD, FCAP (Electronic Signature onFile)  BASIC METABOLIC PANEL     Status: Abnormal   Collection Time    03/18/13 11:34 PM      Result Value Range   Sodium 138  135 - 145 mEq/L   Potassium 3.7  3.5 - 5.1 mEq/L   Chloride 103  96 - 112 mEq/L   CO2 26  19 - 32 mEq/L   Glucose, Bld 141 (*) 70 - 99 mg/dL   BUN 7  6 - 23 mg/dL   Creatinine, Ser 0.45  0.50 - 1.35 mg/dL   Calcium 8.7  8.4 - 40.9 mg/dL   GFR calc non Af Amer 85 (*) >90 mL/min   GFR calc Af Amer >90  >90 mL/min   Comment:            The eGFR has been calculated     using the CKD EPI equation.     This calculation has not been     validated in all clinical     situations.     eGFR's persistently     <90 mL/min signify     possible Chronic Kidney Disease.  CBC WITH DIFFERENTIAL     Status: Abnormal   Collection Time    03/18/13 11:34 PM      Result Value Range   WBC 6.3  4.0 - 10.5 K/uL   RBC 4.39  4.22 - 5.81 MIL/uL   Hemoglobin 13.9  13.0 - 17.0 g/dL   HCT 81.1  91.4 - 78.2 %   MCV 90.4  78.0 - 100.0 fL   MCH 31.7  26.0 - 34.0 pg   MCHC 35.0  30.0 - 36.0 g/dL   RDW 95.6 (*) 21.3 - 08.6 %   Platelets 130 (*) 150 - 400 K/uL   Neutrophils Relative % 53  43 - 77 %   Lymphocytes Relative 28  12 - 46 %   Monocytes Relative 17 (*) 3 - 12 %   Eosinophils Relative 1  0 - 5 %   Basophils Relative 1  0 - 1 %  Neutro Abs 3.2  1.7 - 7.7 K/uL   Lymphs Abs 1.8  0.7 - 4.0 K/uL   Monocytes Absolute 1.1 (*) 0.1 - 1.0 K/uL   Eosinophils Absolute 0.1  0.0 - 0.7 K/uL   Basophils Absolute 0.1  0.0 - 0.1 K/uL   WBC Morphology  ATYPICAL LYMPHOCYTES    HEPATIC FUNCTION PANEL     Status: Abnormal   Collection Time    03/18/13 11:39 PM      Result Value Range   Total Protein 8.1  6.0 - 8.3 g/dL   Albumin 3.3 (*) 3.5 - 5.2 g/dL   AST 73 (*) 0 - 37 U/L   ALT 38  0 - 53 U/L   Alkaline Phosphatase 92  39 - 117 U/L   Total Bilirubin 1.4 (*) 0.3 - 1.2 mg/dL   Bilirubin, Direct 0.9 (*) 0.0 - 0.3 mg/dL   Indirect Bilirubin 0.5  0.3 - 0.9 mg/dL  CG4 I-STAT (LACTIC ACID)     Status: None   Collection Time    03/18/13 11:53 PM      Result Value Range   Lactic Acid, Venous 1.72  0.5 - 2.2 mmol/L  URINALYSIS, ROUTINE W REFLEX MICROSCOPIC     Status: Abnormal   Collection Time    03/19/13 12:12 AM      Result Value Range   Color, Urine AMBER (*) YELLOW   Comment: BIOCHEMICALS MAY BE AFFECTED BY COLOR   APPearance CLEAR  CLEAR   Specific Gravity, Urine 1.018  1.005 - 1.030   pH 6.5  5.0 - 8.0   Glucose, UA NEGATIVE  NEGATIVE mg/dL   Hgb urine dipstick NEGATIVE  NEGATIVE   Bilirubin Urine NEGATIVE  NEGATIVE   Ketones, ur NEGATIVE  NEGATIVE mg/dL   Protein, ur 30 (*) NEGATIVE mg/dL   Urobilinogen, UA 1.0  0.0 - 1.0 mg/dL   Nitrite NEGATIVE  NEGATIVE   Leukocytes, UA NEGATIVE  NEGATIVE  URINE MICROSCOPIC-ADD ON     Status: None   Collection Time    03/19/13 12:12 AM      Result Value Range   Urine-Other MUCOUS PRESENT    CBC WITH DIFFERENTIAL     Status: Abnormal   Collection Time    04/08/13 10:49 PM      Result Value Range   WBC 8.7  4.0 - 10.5 K/uL   RBC 3.71 (*) 4.22 - 5.81 MIL/uL   Hemoglobin 11.9 (*) 13.0 - 17.0 g/dL   HCT 16.1 (*) 09.6 - 04.5 %   MCV 94.9  78.0 - 100.0 fL   MCH 32.1  26.0 - 34.0 pg   MCHC 33.8  30.0 - 36.0 g/dL   RDW 40.9 (*) 81.1 - 91.4 %   Platelets 111 (*) 150 - 400 K/uL   Comment: REPEATED TO VERIFY     SPECIMEN CHECKED FOR CLOTS   Neutrophils Relative % 49  43 - 77 %   Neutro Abs 4.3  1.7 - 7.7 K/uL   Lymphocytes Relative 26  12 - 46 %   Lymphs Abs 2.2  0.7 - 4.0 K/uL    Monocytes Relative 25 (*) 3 - 12 %   Monocytes Absolute 2.1 (*) 0.1 - 1.0 K/uL   Eosinophils Relative 1  0 - 5 %   Eosinophils Absolute 0.1  0.0 - 0.7 K/uL   Basophils Relative 0  0 - 1 %   Basophils Absolute 0.0  0.0 - 0.1 K/uL   WBC Morphology WHITE COUNT  CONFIRMED ON SMEAR     Comment: ATYPICAL LYMPHOCYTES   Smear Review PLATELET COUNT CONFIRMED BY SMEAR    COMPREHENSIVE METABOLIC PANEL     Status: Abnormal   Collection Time    04/08/13 10:49 PM      Result Value Range   Sodium 135  135 - 145 mEq/L   Potassium 4.5  3.5 - 5.1 mEq/L   Chloride 102  96 - 112 mEq/L   CO2 26  19 - 32 mEq/L   Glucose, Bld 125 (*) 70 - 99 mg/dL   BUN 10  6 - 23 mg/dL   Creatinine, Ser 1.61  0.50 - 1.35 mg/dL   Calcium 9.5  8.4 - 09.6 mg/dL   Total Protein 9.1 (*) 6.0 - 8.3 g/dL   Albumin 2.8 (*) 3.5 - 5.2 g/dL   AST 31  0 - 37 U/L   ALT 18  0 - 53 U/L   Alkaline Phosphatase 99  39 - 117 U/L   Total Bilirubin 0.7  0.3 - 1.2 mg/dL   GFR calc non Af Amer 83 (*) >90 mL/min   GFR calc Af Amer >90  >90 mL/min   Comment: (NOTE)     The eGFR has been calculated using the CKD EPI equation.     This calculation has not been validated in all clinical situations.     eGFR's persistently <90 mL/min signify possible Chronic Kidney     Disease.   Assessment/Plan: Cellulitis and abscess Abscess resolving.  Instructed patient on proper wound care and dressing.  Patient still with moderate cellulitis of L inguinal region.  Patient instructed to finish antibiotics, taking them as prescribed.  Patient to take analgesics for pain.  Follow-up in 1 week.  Patient educated on alarm symptoms and when to proceed to the ED.

## 2013-04-12 ENCOUNTER — Other Ambulatory Visit: Payer: Self-pay | Admitting: Dermatology

## 2013-04-13 ENCOUNTER — Ambulatory Visit (HOSPITAL_BASED_OUTPATIENT_CLINIC_OR_DEPARTMENT_OTHER): Payer: BC Managed Care – PPO | Admitting: Hematology & Oncology

## 2013-04-13 ENCOUNTER — Ambulatory Visit (HOSPITAL_BASED_OUTPATIENT_CLINIC_OR_DEPARTMENT_OTHER): Payer: BC Managed Care – PPO

## 2013-04-13 ENCOUNTER — Ambulatory Visit (HOSPITAL_BASED_OUTPATIENT_CLINIC_OR_DEPARTMENT_OTHER): Payer: BC Managed Care – PPO | Admitting: Lab

## 2013-04-13 VITALS — BP 128/69 | HR 95 | Temp 97.9°F | Resp 18 | Ht 68.0 in | Wt 197.0 lb

## 2013-04-13 DIAGNOSIS — M4810 Ankylosing hyperostosis [Forestier], site unspecified: Secondary | ICD-10-CM

## 2013-04-13 DIAGNOSIS — C9000 Multiple myeloma not having achieved remission: Secondary | ICD-10-CM

## 2013-04-13 LAB — CMP (CANCER CENTER ONLY)
ALT(SGPT): 26 U/L (ref 10–47)
AST: 37 U/L (ref 11–38)
Albumin: 3 g/dL — ABNORMAL LOW (ref 3.3–5.5)
Alkaline Phosphatase: 74 U/L (ref 26–84)
BUN, Bld: 9 mg/dL (ref 7–22)
Chloride: 101 mEq/L (ref 98–108)
Potassium: 4.5 mEq/L (ref 3.3–4.7)
Sodium: 136 mEq/L (ref 128–145)

## 2013-04-13 LAB — CBC WITH DIFFERENTIAL (CANCER CENTER ONLY)
HCT: 34.4 % — ABNORMAL LOW (ref 38.7–49.9)
HGB: 11.5 g/dL — ABNORMAL LOW (ref 13.0–17.1)
MCH: 32.5 pg (ref 28.0–33.4)
MCV: 97 fL (ref 82–98)
RBC: 3.54 10*6/uL — ABNORMAL LOW (ref 4.20–5.70)
WBC: 6.5 10*3/uL (ref 4.0–10.0)

## 2013-04-13 LAB — MANUAL DIFFERENTIAL (CHCC SATELLITE)
Band Neutrophils: 1 % (ref 0–10)
Eos: 3 % (ref 0–7)
MONO: 7 % (ref 0–13)
Other Cells: 15 % — ABNORMAL HIGH (ref 0–0)
SEG: 41 % (ref 40–75)

## 2013-04-13 MED ORDER — ZOLEDRONIC ACID 4 MG/100ML IV SOLN
4.0000 mg | Freq: Once | INTRAVENOUS | Status: AC
  Administered 2013-04-13: 4 mg via INTRAVENOUS
  Filled 2013-04-13: qty 100

## 2013-04-13 MED ORDER — SODIUM CHLORIDE 0.9 % IV SOLN
Freq: Once | INTRAVENOUS | Status: AC
  Administered 2013-04-13: 13:00:00 via INTRAVENOUS

## 2013-04-13 NOTE — Patient Instructions (Addendum)
Zoledronic Acid injection (Hypercalcemia, Oncology) What is this medicine? ZOLEDRONIC ACID (ZOE le dron ik AS id) lowers the amount of calcium loss from bone. It is used to treat too much calcium in your blood from cancer. It is also used to prevent complications of cancer that has spread to the bone. This medicine may be used for other purposes; ask your health care provider or pharmacist if you have questions. What should I tell my health care provider before I take this medicine? They need to know if you have any of these conditions: -aspirin-sensitive asthma -dental disease -kidney disease -an unusual or allergic reaction to zoledronic acid, other medicines, foods, dyes, or preservatives -pregnant or trying to get pregnant -breast-feeding How should I use this medicine? This medicine is for infusion into a vein. It is given by a health care professional in a hospital or clinic setting. Talk to your pediatrician regarding the use of this medicine in children. Special care may be needed. Overdosage: If you think you have taken too much of this medicine contact a poison control center or emergency room at once. NOTE: This medicine is only for you. Do not share this medicine with others. What if I miss a dose? It is important not to miss your dose. Call your doctor or health care professional if you are unable to keep an appointment. What may interact with this medicine? -certain antibiotics given by injection -NSAIDs, medicines for pain and inflammation, like ibuprofen or naproxen -some diuretics like bumetanide, furosemide -teriparatide -thalidomide This list may not describe all possible interactions. Give your health care provider a list of all the medicines, herbs, non-prescription drugs, or dietary supplements you use. Also tell them if you smoke, drink alcohol, or use illegal drugs. Some items may interact with your medicine. What should I watch for while using this medicine? Visit  your doctor or health care professional for regular checkups. It may be some time before you see the benefit from this medicine. Do not stop taking your medicine unless your doctor tells you to. Your doctor may order blood tests or other tests to see how you are doing. Women should inform their doctor if they wish to become pregnant or think they might be pregnant. There is a potential for serious side effects to an unborn child. Talk to your health care professional or pharmacist for more information. You should make sure that you get enough calcium and vitamin D while you are taking this medicine. Discuss the foods you eat and the vitamins you take with your health care professional. Some people who take this medicine have severe bone, joint, and/or muscle pain. This medicine may also increase your risk for a broken thigh bone. Tell your doctor right away if you have pain in your upper leg or groin. Tell your doctor if you have any pain that does not go away or that gets worse. What side effects may I notice from receiving this medicine? Side effects that you should report to your doctor or health care professional as soon as possible: -allergic reactions like skin rash, itching or hives, swelling of the face, lips, or tongue -anxiety, confusion, or depression -breathing problems -changes in vision -feeling faint or lightheaded, falls -jaw burning, cramping, pain -muscle cramps, stiffness, or weakness -trouble passing urine or change in the amount of urine Side effects that usually do not require medical attention (report to your doctor or health care professional if they continue or are bothersome): -bone, joint, or muscle pain -  fever -hair loss -irritation at site where injected -loss of appetite -nausea, vomiting -stomach upset -tired This list may not describe all possible side effects. Call your doctor for medical advice about side effects. You may report side effects to FDA at  1-800-FDA-1088. Where should I keep my medicine? This drug is given in a hospital or clinic and will not be stored at home. NOTE: This sheet is a summary. It may not cover all possible information. If you have questions about this medicine, talk to your doctor, pharmacist, or health care provider.  2013, [employer]. (01/22/2011 9:06:58 AM)  

## 2013-04-13 NOTE — Progress Notes (Signed)
This office note has been dictated.

## 2013-04-14 NOTE — Progress Notes (Signed)
DIAGNOSIS: 1. IgG kappa myeloma. 2. Deep venous thrombosis/pulmonary embolus.  CURRENT THERAPY: 1. Revlimid 20 mg p.o. daily (21 days on/7 days off). 2. Zometa 4 mg IV q. month. 3. Xarelto 20 mg p.o. daily.  INTERIM HISTORY:  Mr. Lacuesta comes in for his followup.  He is doing okay from the myeloma point of view.  He has had no further problems with this.  He has been off therapy now for several months.  His myeloma studies are showing that his myeloma is coming back.  We cannot get him on pomalidomide.  As such, we are trying to get him on Revlimid.  He says this will be delivered today.  He has had some problems with an abscess on his upper left thigh.  This has required attention in the emergency room.  This was opened up.  This was incredibly painful for him.  It is still dressed.  When we last saw him, his myeloma levels showed an M-spike of 2.0 g/dL. IgG level was 2660 mg/dL.  His kappa light chain was 45 mg/dL.  He has had no problems with his colostomy.  He says he had a "mucus plug" from his rectum that came out very hard.  He has had no cough.  He is getting around better.  He uses a cane. He has had no leg swelling.  He has had no bleeding.  He has had no infections.  PHYSICAL EXAMINATION:  General:  This is a fairly well-developed, well- nourished white gentleman in no obvious distress.  Vital Signs: Temperature of 97.9, pulse 95, respiratory rate 18, blood pressure 128/69.  Weight is 197.  Head and Neck:  Normocephalic, atraumatic skull.  There are no ocular or oral lesions.  There are no palpable cervical or supraclavicular lymph nodes.  Lungs:  Clear bilaterally. Cardiac:  Regular rate and rhythm with a normal S1, S2.  There are no murmurs, rubs or bruits.  Abdomen:  Soft.  He has a colostomy that is intact.  There is no fluid wave.  There is no abdominal mass.  There is no palpable hepatosplenomegaly.  Extremities:  No clubbing, cyanosis or edema.  He has 4+/5  strength in his legs.  Skin:  He has no ecchymoses or petechia.  LABORATORY STUDIES:  White cell count is 6.5, hemoglobin/hematocrit 11.5/34.4, platelet count 156.  Sodium 136, potassium 4.5, protein 9.5 with an albumin of 3.0.  Calcium is 9.3.  IMPRESSION:  Mr. Venson is a 72 year old gentleman with IgG kappa myeloma.  He had responded very well to Velcade.  Unfortunately, Mr. Gullion cannot tolerate Velcade.  We kept him off therapy for quite a while.  His numbers clearly are showing that his myeloma is recurring.  Again, he says that the Revlimid will arrive today.  He will start it on Sunday for 3 weeks on and 1 week off.  We could give him Decadron when he comes in for his Zometa therapy.  I do not think we need to get him on weekly Decadron at this point.  He has had no problems with the Xarelto.  We will keep him on the Xarelto for right now.  He had a lesion removed from his left forearm.  This was done by Dr. Terri Piedra.  Again, we will get him back in a month.  Hopefully, he will be seeing a response with his myeloma panel.    ______________________________ [name], M.D. PRE/MEDQ  D:  04/13/2013  T:  04/14/2013  Job:  [id number] 

## 2013-04-15 ENCOUNTER — Encounter: Payer: Self-pay | Admitting: Hematology & Oncology

## 2013-04-16 LAB — CULTURE, BLOOD (ROUTINE X 2)

## 2013-04-17 LAB — PROTEIN ELECTROPHORESIS, SERUM, WITH REFLEX
Albumin ELP: 45.8 % — ABNORMAL LOW (ref 55.8–66.1)
Beta Globulin: 4.7 % (ref 4.7–7.2)
M-Spike, %: 2.31 g/dL
Total Protein, Serum Electrophoresis: 8.7 g/dL — ABNORMAL HIGH (ref 6.0–8.3)

## 2013-04-17 LAB — KAPPA/LAMBDA LIGHT CHAINS
Kappa free light chain: 96.9 mg/dL — ABNORMAL HIGH (ref 0.33–1.94)
Kappa:Lambda Ratio: 538.33 — ABNORMAL HIGH (ref 0.26–1.65)
Lambda Free Lght Chn: 0.18 mg/dL — ABNORMAL LOW (ref 0.57–2.63)

## 2013-04-17 LAB — IGG, IGA, IGM: IgM, Serum: 16 mg/dL — ABNORMAL LOW (ref 41–251)

## 2013-04-17 LAB — IFE INTERPRETATION

## 2013-04-18 ENCOUNTER — Encounter: Payer: Self-pay | Admitting: Physician Assistant

## 2013-04-18 ENCOUNTER — Ambulatory Visit (INDEPENDENT_AMBULATORY_CARE_PROVIDER_SITE_OTHER): Payer: Medicare Other | Admitting: Physician Assistant

## 2013-04-18 VITALS — BP 124/76 | HR 122 | Temp 97.9°F | Ht 68.0 in | Wt 196.0 lb

## 2013-04-18 DIAGNOSIS — L0291 Cutaneous abscess, unspecified: Secondary | ICD-10-CM

## 2013-04-18 NOTE — Assessment & Plan Note (Signed)
Wound is healing nicely.  Continue daily cleaning and dressing changes.  Warm bath soaks to promote healing.  Follow-up in 2 weeks for reassessment.

## 2013-04-18 NOTE — Progress Notes (Signed)
Patient ID: Robert Berger, male   DOB: 02/12/1941, 72 y.o.   MRN: [id number]  Patient presents to clinic today for 1 week follow-up of cellulitis of left inguinal region.  Patient states that he finished last tablet of Bactrim on Monday evening.  States he has had no pain in several days.  Has been keeping site of I/D cleaned and dressed daily.  Denies drainage.  Denies fever, chills, nausea.  Otherwise doing well.  Past Medical History  Diagnosis Date  . Arthritis   . GERD (gastroesophageal reflux disease)   . Complication of anesthesia     aspiration during surgery, difficult to wake up  . Multiple myeloma   . Status post radiation therapy 03/23/12 - 04/05/12    T8 - L4/ 20 Gy/ 10 Fractions  . Dysphagia 05/06/12    ED Visit    Current Outpatient Prescriptions on File Prior to Visit  Medication Sig Dispense Refill  . aspirin 81 MG tablet Take 81 mg by mouth daily.      . baclofen (LIORESAL) 10 MG tablet Take 1 tablet (10 mg total) by mouth 3 (three) times daily.  90 each  5  . bisacodyl (DULCOLAX) 5 MG EC tablet Take 1 tablet (5 mg total) by mouth daily as needed for constipation.  14 tablet  0  . diltiazem (CARDIZEM CD) 120 MG 24 hr capsule Take 1 capsule (120 mg total) by mouth every morning.  30 capsule  5  . hydrocortisone-pramoxine (PROCTOFOAM HC) rectal foam Place 1 applicator rectally 2 (two) times daily.  10 g  0  . lansoprazole (PREVACID) 30 MG capsule Take 1 capsule (30 mg total) by mouth every morning.  30 capsule  5  . lisinopril (PRINIVIL,ZESTRIL) 2.5 MG tablet Take 2.5 mg by mouth daily.       . methocarbamol (ROBAXIN) 500 MG tablet Take 500-1,000 mg by mouth 3 (three) times daily as needed (spasm/ pain). For pain/spasms      . ondansetron (ZOFRAN-ODT) 4 MG disintegrating tablet Take 4 mg by mouth every 12 (twelve) hours as needed for nausea.       Marland Kitchen oxyCODONE-acetaminophen (PERCOCET) 5-325 MG per tablet Take 1 tablet by mouth every 4 (four) hours as needed for pain.  20  tablet  0  . polyethylene glycol (MIRALAX / GLYCOLAX) packet Take 17 g by mouth daily as needed (constipation).       . Probiotic Product (PROBIOTIC DAILY PO) Take 1 tablet by mouth every morning.       . Rivaroxaban (XARELTO) 20 MG TABS Take 1 tablet (20 mg total) by mouth every morning.  30 tablet  5  . sulfamethoxazole-trimethoprim (SEPTRA DS) 800-160 MG per tablet Take 1 tablet by mouth every 12 (twelve) hours.  14 tablet  0  . traMADol (ULTRAM) 50 MG tablet Take 50 mg by mouth 2 (two) times daily as needed.        No current facility-administered medications on file prior to visit.    Allergies  Allergen Reactions  . Penicillins Nausea And Vomiting, Swelling and Rash    Family History  Problem Relation Age of Onset  . Cancer Maternal Grandfather   . Cancer Maternal Grandmother     History   Social History  . Marital Status: Significant Other    Spouse Name: N/A    Number of Children: 3  . Years of Education: 14   Occupational History  . Games developer, on medical leave    Social History  Main Topics  . Smoking status: Former Smoker    Types: Cigarettes    Quit date: 03/09/1978  . Smokeless tobacco: Never Used  . Alcohol Use: Yes     Comment: 1 glass of wine or a burbon daily., none in the last 3 months  . Drug Use: No  . Sexual Activity: No   Other Topics Concern  . None   Social History Narrative   Divorced x 2, lives with male partner (x 20 years). Normally able to ambulate without assistance.   2 children- both daughters who live in [place]   Completed some college   Enjoys computers, camping trailer.     Review of Systems  Constitutional: Negative for fever, chills and malaise/fatigue.  Gastrointestinal: Negative for nausea and vomiting.  Skin:       Still noting some hardness of skin around site of abscess  Neurological: Negative for headaches.   Filed Vitals:   04/18/13 1317  BP: 124/76  Pulse: 122  Temp: 97.9 F (36.6 C)    Physical  Exam  Vitals reviewed. Constitutional: He is well-developed, well-nourished, and in no distress.  HENT:  Head: Normocephalic and atraumatic.  Cardiovascular: Normal rate, regular rhythm and normal heart sounds.   Skin:  1.5 cm incision from prior I/D is healing.  No evidence of drainage, erythema or tenderness with palpation.  Surrounding skin is slightly firm, but improved from last examination.   Recent Results (from the past 2160 hour(s))  HEMOGLOBIN A1C     Status: None   Collection Time    01/23/13  3:11 PM      Result Value Range   Hemoglobin A1C 5.4  <5.7 %   Comment:                                                                            According to the ADA Clinical Practice Recommendations for 2011, when     HbA1c is used as a screening test:             >=6.5%   Diagnostic of Diabetes Mellitus                (if abnormal result is confirmed)           5.7-6.4%   Increased risk of developing Diabetes Mellitus           References:Diagnosis and Classification of Diabetes Mellitus,Diabetes     Care,2011,34(Suppl 1):S62-S69 and Standards of Medical Care in             Diabetes - 2011,Diabetes Care,2011,34 (Suppl 1):S11-S61.         Mean Plasma Glucose 108  <117 mg/dL  CBC WITH DIFFERENTIAL ([facility])     Status: None   Collection Time    01/29/13  1:20 PM      Result Value Range   WBC 4.5  4.0 - 10.0 10e3/uL   RBC 5.12  4.20 - 5.70 10e6/uL   HGB 15.7  13.0 - 17.1 g/dL   HCT 45.4  09.8 - 11.9 %   MCV 91  82 - 98 fL   MCH 30.7  28.0 - 33.4 pg   MCHC 33.8  32.0 - 35.9  g/dL   RDW 08.6  57.8 - 46.9 %   Platelets 160  145 - 400 10e3/uL   NEUT# 2.7  1.5 - 6.5 10e3/uL   LYMPH# 1.3  0.9 - 3.3 10e3/uL   MONO# 0.4  0.1 - 0.9 10e3/uL   Eosinophils Absolute 0.1  0.0 - 0.5 10e3/uL   BASO# 0.0  0.0 - 0.2 10e3/uL   NEUT% 60.8  40.0 - 80.0 %   LYMPH% 27.9  14.0 - 48.0 %   MONO% 9.3  0.0 - 13.0 %   EOS% 1.8  0.0 - 7.0 %   BASO% 0.2  0.0 - 2.0 %  COMPREHENSIVE  METABOLIC PANEL     Status: Abnormal   Collection Time    01/29/13  1:22 PM      Result Value Range   Sodium 137  135 - 145 mEq/L   Potassium 3.9  3.5 - 5.3 mEq/L   Chloride 104  96 - 112 mEq/L   CO2 21  19 - 32 mEq/L   Glucose, Bld 148 (*) 70 - 99 mg/dL   BUN 10  6 - 23 mg/dL   Creatinine, Ser 6.29  0.50 - 1.35 mg/dL   Total Bilirubin 0.8  0.3 - 1.2 mg/dL   Alkaline Phosphatase 55  39 - 117 U/L   AST 23  0 - 37 U/L   ALT 20  0 - 53 U/L   Total Protein 6.9  6.0 - 8.3 g/dL   Albumin 3.7  3.5 - 5.2 g/dL   Calcium 9.2  8.4 - 52.8 mg/dL  KAPPA/LAMBDA LIGHT CHAINS     Status: Abnormal   Collection Time    01/29/13  1:22 PM      Result Value Range   Kappa free light chain 8.77 (*) 0.33 - 1.94 mg/dL   Lambda Free Lght Chn 0.90  0.57 - 2.63 mg/dL   Kappa:Lambda Ratio 4.13 (*) 0.26 - 1.65  PROTEIN ELECTROPHORESIS, SERUM, WITH REFLEX     Status: Abnormal   Collection Time    01/29/13  1:22 PM      Result Value Range   Total Protein, Serum Electrophoresis 6.9  6.0 - 8.3 g/dL   Albumin ELP 24.4 (*) 55.8 - 66.1 %   Alpha-1-Globulin 4.1  2.9 - 4.9 %   Alpha-2-Globulin 14.0 (*) 7.1 - 11.8 %   Beta Globulin 5.4  4.7 - 7.2 %   Beta 2 4.5  3.2 - 6.5 %   Gamma Globulin 16.4  11.1 - 18.8 %   M-Spike, % 0.71     SPE Interp. *     Comment: A restricted band consistent with monoclonal protein is present.The monoclonal protein peak accounts for 0.71 g/dL of the WNUUV2.53 g/dL of protein in the gamma region.Results are consistent with SPE performed on 6/2/14Reflex testing will be performed as      indicated. Reviewed by Nehemiah Massed Mammarappallil MD (Electronic Signature on File)   COMMENT (PROTEIN ELECTROPHOR) *     Comment: ---------------Serum protein electrophoresis is a useful screening procedure in thedetection of various pathophysiologic states such as inflammation,gammopathies, protein loss and other dysproteinemias.  Immunofixationelectrophoresis (IFE) is a more      sensitive technique for  theidentification of M-proteins found in patients with monoclonalgammopathy of unknown significance (MGUS), amyloidosis, early ortreated myeloma or macroglobulinemia, solitary plasmacytoma orextramedullary plasmacytoma.  IGG, IGA, IGM     Status: Abnormal   Collection Time    01/29/13  1:22 PM  Result Value Range   IgG (Immunoglobin G), Serum 1270  650 - 1600 mg/dL   IgA 26 (*) 68 - 308 mg/dL   IgM, Serum 50  41 - 657 mg/dL  IFE INTERPRETATION     Status: None   Collection Time    01/29/13  1:22 PM      Result Value Range   Immunofix Electr Int *     Comment: Monoclonal IgG kappa protein is present.Area of slightly restricted mobility in the IgG and lambda lanes.Suggest repeat in 6-8 months, if clinically indicated. Reviewed by Nehemiah Massed Mammarappallil MD (Electronic Signature on File)  CBC WITH DIFFERENTIAL     Status: Abnormal   Collection Time    02/05/13  1:45 AM      Result Value Range   WBC 5.7  4.0 - 10.5 K/uL   RBC 4.92  4.22 - 5.81 MIL/uL   Hemoglobin 14.8  13.0 - 17.0 g/dL   HCT 84.6  96.2 - 95.2 %   MCV 87.6  78.0 - 100.0 fL   MCH 30.1  26.0 - 34.0 pg   MCHC 34.3  30.0 - 36.0 g/dL   RDW 84.1  32.4 - 40.1 %   Platelets 139 (*) 150 - 400 K/uL   Neutrophils Relative % 72  43 - 77 %   Neutro Abs 4.1  1.7 - 7.7 K/uL   Lymphocytes Relative 18  12 - 46 %   Lymphs Abs 1.0  0.7 - 4.0 K/uL   Monocytes Relative 9  3 - 12 %   Monocytes Absolute 0.5  0.1 - 1.0 K/uL   Eosinophils Relative 1  0 - 5 %   Eosinophils Absolute 0.1  0.0 - 0.7 K/uL   Basophils Relative 0  0 - 1 %   Basophils Absolute 0.0  0.0 - 0.1 K/uL  COMPREHENSIVE METABOLIC PANEL     Status: Abnormal   Collection Time    02/05/13  1:45 AM      Result Value Range   Sodium 138  135 - 145 mEq/L   Potassium 3.9  3.5 - 5.1 mEq/L   Chloride 103  96 - 112 mEq/L   CO2 26  19 - 32 mEq/L   Glucose, Bld 143 (*) 70 - 99 mg/dL   BUN 7  6 - 23 mg/dL   Creatinine, Ser 0.27  0.50 - 1.35 mg/dL   Calcium 9.3  8.4 - 25.3  mg/dL   Total Protein 7.1  6.0 - 8.3 g/dL   Albumin 3.5  3.5 - 5.2 g/dL   AST 22  0 - 37 U/L   ALT 22  0 - 53 U/L   Alkaline Phosphatase 63  39 - 117 U/L   Total Bilirubin 0.6  0.3 - 1.2 mg/dL   GFR calc non Af Amer 88 (*) >90 mL/min   GFR calc Af Amer >90  >90 mL/min   Comment:            The eGFR has been calculated     using the CKD EPI equation.     This calculation has not been     validated in all clinical     situations.     eGFR's persistently     <90 mL/min signify     possible Chronic Kidney Disease.  LIPASE, BLOOD     Status: None   Collection Time    02/05/13  1:45 AM      Result Value Range  Lipase 27  11 - 59 U/L  CG4 I-STAT (LACTIC ACID)     Status: None   Collection Time    02/05/13  1:58 AM      Result Value Range   Lactic Acid, Venous 1.95  0.5 - 2.2 mmol/L  URINALYSIS, ROUTINE W REFLEX MICROSCOPIC     Status: None   Collection Time    02/05/13  4:30 AM      Result Value Range   Color, Urine YELLOW  YELLOW   APPearance CLEAR  CLEAR   Specific Gravity, Urine 1.028  1.005 - 1.030   pH 6.5  5.0 - 8.0   Glucose, UA NEGATIVE  NEGATIVE mg/dL   Hgb urine dipstick NEGATIVE  NEGATIVE   Bilirubin Urine NEGATIVE  NEGATIVE   Ketones, ur NEGATIVE  NEGATIVE mg/dL   Protein, ur NEGATIVE  NEGATIVE mg/dL   Urobilinogen, UA 1.0  0.0 - 1.0 mg/dL   Nitrite NEGATIVE  NEGATIVE   Leukocytes, UA NEGATIVE  NEGATIVE   Comment: MICROSCOPIC NOT DONE ON URINES WITH NEGATIVE PROTEIN, BLOOD, LEUKOCYTES, NITRITE, OR GLUCOSE <1000 mg/dL.  CBC WITH DIFFERENTIAL ([facility])     Status: Abnormal   Collection Time    02/15/13  3:25 PM      Result Value Range   WBC 2.5 (*) 4.0 - 10.0 10e3/uL   RBC 4.97  4.20 - 5.70 10e6/uL   HGB 15.2  13.0 - 17.1 g/dL   HCT 62.1  30.8 - 65.7 %   MCV 92  82 - 98 fL   MCH 30.6  28.0 - 33.4 pg   MCHC 33.2  32.0 - 35.9 g/dL   RDW 84.6  96.2 - 95.2 %   Platelets 163  145 - 400 10e3/uL   NEUT# 1.1 (*) 1.5 - 6.5 10e3/uL   LYMPH# 0.9  0.9 - 3.3  10e3/uL   MONO# 0.4  0.1 - 0.9 10e3/uL   Eosinophils Absolute 0.1  0.0 - 0.5 10e3/uL   BASO# 0.0  0.0 - 0.2 10e3/uL   NEUT% 43.2  40.0 - 80.0 %   LYMPH% 35.3  14.0 - 48.0 %   MONO% 17.5 (*) 0.0 - 13.0 %   EOS% 3.2  0.0 - 7.0 %   BASO% 0.8  0.0 - 2.0 %  COMPREHENSIVE METABOLIC PANEL     Status: Abnormal   Collection Time    02/15/13  3:25 PM      Result Value Range   Sodium 137  135 - 145 mEq/L   Potassium 4.4  3.5 - 5.3 mEq/L   Chloride 103  96 - 112 mEq/L   CO2 25  19 - 32 mEq/L   Glucose, Bld 100 (*) 70 - 99 mg/dL   BUN 9  6 - 23 mg/dL   Creatinine, Ser 8.41  0.50 - 1.35 mg/dL   Total Bilirubin 0.7  0.3 - 1.2 mg/dL   Alkaline Phosphatase 154 (*) 39 - 117 U/L   AST 29  0 - 37 U/L   ALT 35  0 - 53 U/L   Total Protein 6.9  6.0 - 8.3 g/dL   Albumin 3.8  3.5 - 5.2 g/dL   Calcium 9.0  8.4 - 32.4 mg/dL  PROTEIN ELECTROPHORESIS, SERUM, WITH REFLEX     Status: Abnormal   Collection Time    02/15/13  3:25 PM      Result Value Range   Total Protein, Serum Electrophoresis 6.9  6.0 - 8.3 g/dL   Albumin  ELP 50.2 (*) 55.8 - 66.1 %   Alpha-1-Globulin 7.9 (*) 2.9 - 4.9 %   Alpha-2-Globulin 13.9 (*) 7.1 - 11.8 %   Beta Globulin 5.7  4.7 - 7.2 %   Beta 2 3.4  3.2 - 6.5 %   Gamma Globulin 18.9 (*) 11.1 - 18.8 %   M-Spike, % 1.01     SPE Interp. *     Comment: A restricted band consistent with monoclonal protein is present.The monoclonal protein peak accounts for 1.01 g/dL of the ZOXWR6.04 g/dL of protein in the gamma region.Results are consistent with SPE performed on 6/24/14Reflex testing will be performed      as indicated.Reviewed by Dallas Breeding, MD, PhD, FCAP (Electronic Signature onFile)   COMMENT (PROTEIN ELECTROPHOR) *     Comment: ---------------Serum protein electrophoresis is a useful screening procedure in thedetection of various pathophysiologic states such as inflammation,gammopathies, protein loss and other dysproteinemias.  Immunofixationelectrophoresis (IFE) is a more       sensitive technique for theidentification of M-proteins found in patients with monoclonalgammopathy of unknown significance (MGUS), amyloidosis, early ortreated myeloma or macroglobulinemia, solitary plasmacytoma orextramedullary plasmacytoma.  KAPPA/LAMBDA LIGHT CHAINS     Status: Abnormal   Collection Time    02/15/13  3:25 PM      Result Value Range   Kappa free light chain 17.10 (*) 0.33 - 1.94 mg/dL   Lambda Free Lght Chn 0.68  0.57 - 2.63 mg/dL   Kappa:Lambda Ratio 54.09 (*) 0.26 - 1.65  IGG, IGA, IGM     Status: Abnormal   Collection Time    02/15/13  3:25 PM      Result Value Range   IgG (Immunoglobin G), Serum 1640 (*) 650 - 1600 mg/dL   IgA 21 (*) 68 - 811 mg/dL   IgM, Serum 37 (*) 41 - 251 mg/dL  IFE INTERPRETATION     Status: None   Collection Time    02/15/13  3:25 PM      Result Value Range   Immunofix Electr Int *     Comment: Monoclonal IgG kappa protein is present.Reviewed by Dallas Breeding, MD, PhD, FCAP (Electronic Signature onFile)  CBC WITH DIFFERENTIAL     Status: Abnormal   Collection Time    02/17/13  7:58 AM      Result Value Range   WBC 3.5 (*) 4.0 - 10.5 K/uL   RBC 4.82  4.22 - 5.81 MIL/uL   Hemoglobin 14.4  13.0 - 17.0 g/dL   HCT 91.4  78.2 - 95.6 %   MCV 89.6  78.0 - 100.0 fL   MCH 29.9  26.0 - 34.0 pg   MCHC 33.3  30.0 - 36.0 g/dL   RDW 21.3  08.6 - 57.8 %   Platelets 146 (*) 150 - 400 K/uL   Neutrophils Relative % 74  43 - 77 %   Neutro Abs 2.6  1.7 - 7.7 K/uL   Lymphocytes Relative 14  12 - 46 %   Lymphs Abs 0.5 (*) 0.7 - 4.0 K/uL   Monocytes Relative 11  3 - 12 %   Monocytes Absolute 0.4  0.1 - 1.0 K/uL   Eosinophils Relative 1  0 - 5 %   Eosinophils Absolute 0.0  0.0 - 0.7 K/uL   Basophils Relative 0  0 - 1 %   Basophils Absolute 0.0  0.0 - 0.1 K/uL  COMPREHENSIVE METABOLIC PANEL     Status: Abnormal   Collection Time  02/17/13  7:58 AM      Result Value Range   Sodium 136  135 - 145 mEq/L   Potassium 4.2  3.5 - 5.1 mEq/L    Chloride 101  96 - 112 mEq/L   CO2 28  19 - 32 mEq/L   Glucose, Bld 123 (*) 70 - 99 mg/dL   BUN 6  6 - 23 mg/dL   Creatinine, Ser 1.61  0.50 - 1.35 mg/dL   Calcium 9.3  8.4 - 09.6 mg/dL   Total Protein 7.3  6.0 - 8.3 g/dL   Albumin 3.1 (*) 3.5 - 5.2 g/dL   AST 85 (*) 0 - 37 U/L   ALT 49  0 - 53 U/L   Alkaline Phosphatase 177 (*) 39 - 117 U/L   Total Bilirubin 2.3 (*) 0.3 - 1.2 mg/dL   GFR calc non Af Amer >90  >90 mL/min   GFR calc Af Amer >90  >90 mL/min   Comment:            The eGFR has been calculated     using the CKD EPI equation.     This calculation has not been     validated in all clinical     situations.     eGFR's persistently     <90 mL/min signify     possible Chronic Kidney Disease.  LIPASE, BLOOD     Status: Abnormal   Collection Time    02/17/13  7:58 AM      Result Value Range   Lipase 84 (*) 11 - 59 U/L  LACTIC ACID, PLASMA     Status: None   Collection Time    02/17/13  7:58 AM      Result Value Range   Lactic Acid, Venous 1.5  0.5 - 2.2 mmol/L  URINALYSIS, ROUTINE W REFLEX MICROSCOPIC     Status: Abnormal   Collection Time    02/17/13  8:20 AM      Result Value Range   Color, Urine AMBER (*) YELLOW   Comment: BIOCHEMICALS MAY BE AFFECTED BY COLOR   APPearance CLEAR  CLEAR   Specific Gravity, Urine 1.022  1.005 - 1.030   pH 6.5  5.0 - 8.0   Glucose, UA NEGATIVE  NEGATIVE mg/dL   Hgb urine dipstick NEGATIVE  NEGATIVE   Bilirubin Urine SMALL (*) NEGATIVE   Ketones, ur NEGATIVE  NEGATIVE mg/dL   Protein, ur NEGATIVE  NEGATIVE mg/dL   Urobilinogen, UA 1.0  0.0 - 1.0 mg/dL   Nitrite NEGATIVE  NEGATIVE   Leukocytes, UA TRACE (*) NEGATIVE  URINE MICROSCOPIC-ADD ON     Status: None   Collection Time    02/17/13  8:20 AM      Result Value Range   WBC, UA 0-2  <3 WBC/hpf   Bacteria, UA RARE  RARE   Urine-Other MUCOUS PRESENT    CBC WITH DIFFERENTIAL ([facility])     Status: Abnormal   Collection Time    03/16/13  9:11 AM      Result Value  Range   WBC 3.8 (*) 4.0 - 10.0 10e3/uL   RBC 4.51  4.20 - 5.70 10e6/uL   HGB 14.1  13.0 - 17.1 g/dL   HCT 04.5  40.9 - 81.1 %   MCV 93  82 - 98 fL   MCH 31.3  28.0 - 33.4 pg   MCHC 33.6  32.0 - 35.9 g/dL   RDW 91.4  78.2 - 95.6 %  Platelets 119 (*) 145 - 400 10e3/uL   NEUT# 2.2  1.5 - 6.5 10e3/uL   LYMPH# 0.9  0.9 - 3.3 10e3/uL   MONO# 0.6  0.1 - 0.9 10e3/uL   Eosinophils Absolute 0.1  0.0 - 0.5 10e3/uL   BASO# 0.0  0.0 - 0.2 10e3/uL   NEUT% 56.9  40.0 - 80.0 %   LYMPH% 24.0  14.0 - 48.0 %   MONO% 15.4 (*) 0.0 - 13.0 %   EOS% 3.4  0.0 - 7.0 %   BASO% 0.3  0.0 - 2.0 %  IGG, IGA, IGM     Status: Abnormal   Collection Time    03/16/13  9:11 AM      Result Value Range   IgG (Immunoglobin G), Serum 2660 (*) 650 - 1600 mg/dL   IgA 13 (*) 68 - 098 mg/dL   IgM, Serum 16 (*) 41 - 251 mg/dL  PROTEIN ELECTROPHORESIS, SERUM, WITH REFLEX     Status: Abnormal   Collection Time    03/16/13  9:11 AM      Result Value Range   Total Protein, Serum Electrophoresis 8.1  6.0 - 8.3 g/dL   Albumin ELP 11.9 (*) 55.8 - 66.1 %   Alpha-1-Globulin 3.9  2.9 - 4.9 %   Alpha-2-Globulin 12.8 (*) 7.1 - 11.8 %   Beta Globulin 4.5 (*) 4.7 - 7.2 %   Beta 2 2.9 (*) 3.2 - 6.5 %   Gamma Globulin 28.0 (*) 11.1 - 18.8 %   M-Spike, % 2.00     SPE Interp. *     Comment: A restricted band consistent with monoclonal protein is present.The monoclonal protein peak accounts for 2.00 g/dL of the JYNWG9.56 g/dL of protein in the gamma region.Results are consistent with SPE performed on 7/11/14Reflex testing will be performed      as indicated. Reviewed by Dallas Breeding, MD, PhD, FCAP (Electronic Signature onFile)   COMMENT (PROTEIN ELECTROPHOR) *     Comment: ---------------Serum protein electrophoresis is a useful screening procedure in thedetection of various pathophysiologic states such as inflammation,gammopathies, protein loss and other dysproteinemias.  Immunofixationelectrophoresis (IFE) is a more       sensitive technique for theidentification of M-proteins found in patients with monoclonalgammopathy of unknown significance (MGUS), amyloidosis, early ortreated myeloma or macroglobulinemia, solitary plasmacytoma orextramedullary plasmacytoma.  KAPPA/LAMBDA LIGHT CHAINS     Status: Abnormal   Collection Time    03/16/13  9:11 AM      Result Value Range   Kappa free light chain 45.00 (*) 0.33 - 1.94 mg/dL   Lambda Free Lght Chn 0.57  0.57 - 2.63 mg/dL   Kappa:Lambda Ratio 21.30 (*) 0.26 - 1.65  COMPREHENSIVE METABOLIC PANEL ([facility] ONLY)     Status: Abnormal   Collection Time    03/16/13  9:11 AM      Result Value Range   Sodium 138  128 - 145 mEq/L   Potassium 4.0  3.3 - 4.7 mEq/L   Chloride 103  98 - 108 mEq/L   CO2 27  18 - 33 mEq/L   Glucose, Bld 148 (*) 73 - 118 mg/dL   BUN, Bld 6 (*) 7 - 22 mg/dL   Creat 0.9  0.6 - 1.2 mg/dl   Total Bilirubin 8.65  0.20 - 1.60 mg/dl   Alkaline Phosphatase 62  26 - 84 U/L   AST 31  11 - 38 U/L   ALT(SGPT) 29  10 - 47 U/L  Total Protein 8.1  6.4 - 8.1 g/dL   Albumin 3.2 (*) 3.3 - 5.5 g/dL   Calcium 8.8  8.0 - 04.5 mg/dL  IFE INTERPRETATION     Status: None   Collection Time    03/16/13  9:11 AM      Result Value Range   Immunofix Electr Int *     Comment: Monoclonal IgG kappa protein is present.Area of slightly restricted mobility in the IgG and lambda lanes.Suggest repeat in 6-8 months, if clinically indicated. Reviewed by Dallas Breeding, MD, PhD, FCAP (Electronic Signature onFile)  BASIC METABOLIC PANEL     Status: Abnormal   Collection Time    03/18/13 11:34 PM      Result Value Range   Sodium 138  135 - 145 mEq/L   Potassium 3.7  3.5 - 5.1 mEq/L   Chloride 103  96 - 112 mEq/L   CO2 26  19 - 32 mEq/L   Glucose, Bld 141 (*) 70 - 99 mg/dL   BUN 7  6 - 23 mg/dL   Creatinine, Ser 4.09  0.50 - 1.35 mg/dL   Calcium 8.7  8.4 - 81.1 mg/dL   GFR calc non Af Amer 85 (*) >90 mL/min   GFR calc Af Amer >90  >90 mL/min   Comment:             The eGFR has been calculated     using the CKD EPI equation.     This calculation has not been     validated in all clinical     situations.     eGFR's persistently     <90 mL/min signify     possible Chronic Kidney Disease.  CBC WITH DIFFERENTIAL     Status: Abnormal   Collection Time    03/18/13 11:34 PM      Result Value Range   WBC 6.3  4.0 - 10.5 K/uL   RBC 4.39  4.22 - 5.81 MIL/uL   Hemoglobin 13.9  13.0 - 17.0 g/dL   HCT 91.4  78.2 - 95.6 %   MCV 90.4  78.0 - 100.0 fL   MCH 31.7  26.0 - 34.0 pg   MCHC 35.0  30.0 - 36.0 g/dL   RDW 21.3 (*) 08.6 - 57.8 %   Platelets 130 (*) 150 - 400 K/uL   Neutrophils Relative % 53  43 - 77 %   Lymphocytes Relative 28  12 - 46 %   Monocytes Relative 17 (*) 3 - 12 %   Eosinophils Relative 1  0 - 5 %   Basophils Relative 1  0 - 1 %   Neutro Abs 3.2  1.7 - 7.7 K/uL   Lymphs Abs 1.8  0.7 - 4.0 K/uL   Monocytes Absolute 1.1 (*) 0.1 - 1.0 K/uL   Eosinophils Absolute 0.1  0.0 - 0.7 K/uL   Basophils Absolute 0.1  0.0 - 0.1 K/uL   WBC Morphology ATYPICAL LYMPHOCYTES    HEPATIC FUNCTION PANEL     Status: Abnormal   Collection Time    03/18/13 11:39 PM      Result Value Range   Total Protein 8.1  6.0 - 8.3 g/dL   Albumin 3.3 (*) 3.5 - 5.2 g/dL   AST 73 (*) 0 - 37 U/L   ALT 38  0 - 53 U/L   Alkaline Phosphatase 92  39 - 117 U/L   Total Bilirubin 1.4 (*) 0.3 - 1.2 mg/dL   Bilirubin,  Direct 0.9 (*) 0.0 - 0.3 mg/dL   Indirect Bilirubin 0.5  0.3 - 0.9 mg/dL  CG4 I-STAT (LACTIC ACID)     Status: None   Collection Time    03/18/13 11:53 PM      Result Value Range   Lactic Acid, Venous 1.72  0.5 - 2.2 mmol/L  URINALYSIS, ROUTINE W REFLEX MICROSCOPIC     Status: Abnormal   Collection Time    03/19/13 12:12 AM      Result Value Range   Color, Urine AMBER (*) YELLOW   Comment: BIOCHEMICALS MAY BE AFFECTED BY COLOR   APPearance CLEAR  CLEAR   Specific Gravity, Urine 1.018  1.005 - 1.030   pH 6.5  5.0 - 8.0   Glucose, UA NEGATIVE   NEGATIVE mg/dL   Hgb urine dipstick NEGATIVE  NEGATIVE   Bilirubin Urine NEGATIVE  NEGATIVE   Ketones, ur NEGATIVE  NEGATIVE mg/dL   Protein, ur 30 (*) NEGATIVE mg/dL   Urobilinogen, UA 1.0  0.0 - 1.0 mg/dL   Nitrite NEGATIVE  NEGATIVE   Leukocytes, UA NEGATIVE  NEGATIVE  URINE MICROSCOPIC-ADD ON     Status: None   Collection Time    03/19/13 12:12 AM      Result Value Range   Urine-Other MUCOUS PRESENT    CULTURE, BLOOD (ROUTINE X 2)     Status: None   Collection Time    04/08/13 10:30 PM      Result Value Range   Specimen Description BLOOD LEFT ARM     Special Requests BOTTLES DRAWN AEROBIC AND ANAEROBIC 5CC EACH     Culture  Setup Time       Value: 04/09/2013 10:30     Performed at [facility]   Culture       Value: NO GROWTH 5 DAYS     Performed at [facility]   Report Status 04/16/2013 FINAL    CULTURE, BLOOD (ROUTINE X 2)     Status: None   Collection Time    04/08/13 10:45 PM      Result Value Range   Specimen Description BLOOD RIGHT HAND     Special Requests BOTTLES DRAWN AEROBIC AND ANAEROBIC 5CC EACH     Culture  Setup Time       Value: 04/09/2013 10:30     Performed at [facility]   Culture       Value: NO GROWTH 5 DAYS     Performed at [facility]   Report Status 04/16/2013 FINAL    CBC WITH DIFFERENTIAL     Status: Abnormal   Collection Time    04/08/13 10:49 PM      Result Value Range   WBC 8.7  4.0 - 10.5 K/uL   RBC 3.71 (*) 4.22 - 5.81 MIL/uL   Hemoglobin 11.9 (*) 13.0 - 17.0 g/dL   HCT 41.3 (*) 24.4 - 01.0 %   MCV 94.9  78.0 - 100.0 fL   MCH 32.1  26.0 - 34.0 pg   MCHC 33.8  30.0 - 36.0 g/dL   RDW 27.2 (*) 53.6 - 64.4 %   Platelets 111 (*) 150 - 400 K/uL   Comment: REPEATED TO VERIFY     SPECIMEN CHECKED FOR CLOTS   Neutrophils Relative % 49  43 - 77 %   Neutro Abs 4.3  1.7 - 7.7 K/uL   Lymphocytes Relative 26  12 - 46 %   Lymphs Abs 2.2  0.7 -  4.0 K/uL   Monocytes Relative 25 (*) 3 - 12 %   Monocytes  Absolute 2.1 (*) 0.1 - 1.0 K/uL   Eosinophils Relative 1  0 - 5 %   Eosinophils Absolute 0.1  0.0 - 0.7 K/uL   Basophils Relative 0  0 - 1 %   Basophils Absolute 0.0  0.0 - 0.1 K/uL   WBC Morphology WHITE COUNT CONFIRMED ON SMEAR     Comment: ATYPICAL LYMPHOCYTES   Smear Review PLATELET COUNT CONFIRMED BY SMEAR    COMPREHENSIVE METABOLIC PANEL     Status: Abnormal   Collection Time    04/08/13 10:49 PM      Result Value Range   Sodium 135  135 - 145 mEq/L   Potassium 4.5  3.5 - 5.1 mEq/L   Chloride 102  96 - 112 mEq/L   CO2 26  19 - 32 mEq/L   Glucose, Bld 125 (*) 70 - 99 mg/dL   BUN 10  6 - 23 mg/dL   Creatinine, Ser 5.62  0.50 - 1.35 mg/dL   Calcium 9.5  8.4 - 13.0 mg/dL   Total Protein 9.1 (*) 6.0 - 8.3 g/dL   Albumin 2.8 (*) 3.5 - 5.2 g/dL   AST 31  0 - 37 U/L   ALT 18  0 - 53 U/L   Alkaline Phosphatase 99  39 - 117 U/L   Total Bilirubin 0.7  0.3 - 1.2 mg/dL   GFR calc non Af Amer 83 (*) >90 mL/min   GFR calc Af Amer >90  >90 mL/min   Comment: (NOTE)     The eGFR has been calculated using the CKD EPI equation.     This calculation has not been validated in all clinical situations.     eGFR's persistently <90 mL/min signify possible Chronic Kidney     Disease.  CBC WITH DIFFERENTIAL ([facility])     Status: Abnormal   Collection Time    04/13/13 12:10 PM      Result Value Range   WBC 6.5  4.0 - 10.0 10e3/uL   RBC 3.54 (*) 4.20 - 5.70 10e6/uL   HGB 11.5 (*) 13.0 - 17.1 g/dL   HCT 86.5 (*) 78.4 - 69.6 %   MCV 97  82 - 98 fL   MCH 32.5  28.0 - 33.4 pg   MCHC 33.4  32.0 - 35.9 g/dL   RDW 29.5 (*) 28.4 - 13.2 %   Platelets 156  145 - 400 10e3/uL  MANUAL DIFFERENTIAL ([facility])     Status: Abnormal   Collection Time    04/13/13 12:10 PM      Result Value Range   ANC ([facility] manual diff) 2.7  1.5 - 6.5 10e3/uL   ALC 2.1  0.9 - 3.3 10e3/uL   SEG 41  40 - 75 %   Band Neutrophils 1  0 - 10 %   LYMPH 33  14 - 48 %   MONO 7  0 - 13 %   Eos 3  0 - 7 %   Other  Cells   (*) 0 - 0 %   Value: 15 Atypical Mononuclear cells(Plasma cells and large mononuclear)   Polychromasia Slight  Slight   Ovalocyte Few  Negative   RBC Comments Rouleaux present     WBC Comment Variant Lymphs(some are plasmacytoid)     PLT EST [place] Adequate  Adequate  IGG, IGA, IGM     Status: Abnormal  Collection Time    04/13/13  1:14 PM      Result Value Range   IgG (Immunoglobin G), Serum 3750 (*) 650 - 1600 mg/dL   IgA 9 (*) 68 - 161 mg/dL   IgM, Serum 16 (*) 41 - 251 mg/dL  KAPPA/LAMBDA LIGHT CHAINS     Status: Abnormal   Collection Time    04/13/13  1:14 PM      Result Value Range   Kappa free light chain 96.90 (*) 0.33 - 1.94 mg/dL   Lambda Free Lght Chn 0.18 (*) 0.57 - 2.63 mg/dL   Kappa:Lambda Ratio 096.04 (*) 0.26 - 1.65  PROTEIN ELECTROPHORESIS, SERUM, WITH REFLEX     Status: Abnormal   Collection Time    04/13/13  1:14 PM      Result Value Range   Total Protein, Serum Electrophoresis 8.7 (*) 6.0 - 8.3 g/dL   Albumin ELP 54.0 (*) 55.8 - 66.1 %   Alpha-1-Globulin 4.2  2.9 - 4.9 %   Alpha-2-Globulin 14.1 (*) 7.1 - 11.8 %   Beta Globulin 4.7  4.7 - 7.2 %   Beta 2 2.4 (*) 3.2 - 6.5 %   Gamma Globulin 28.8 (*) 11.1 - 18.8 %   M-Spike, % 2.31     SPE Interp. *     Comment: A restricted band consistent with monoclonal protein is present.The monoclonal protein peak accounts for 2.31 g/dL of the JWJXB1.47 g/dL of protein in the gamma region.Results are consistent with SPE performed on 8/11/14Reflex testing will be performed      as indicated.Reviewed by Dallas Breeding, MD, PhD, FCAP (Electronic Signature onFile)   COMMENT (PROTEIN ELECTROPHOR) *     Comment: ---------------Serum protein electrophoresis is a useful screening procedure in thedetection of various pathophysiologic states such as inflammation,gammopathies, protein loss and other dysproteinemias.  Immunofixationelectrophoresis (IFE) is a more      sensitive technique for theidentification of M-proteins  found in patients with monoclonalgammopathy of unknown significance (MGUS), amyloidosis, early ortreated myeloma or macroglobulinemia, solitary plasmacytoma orextramedullary plasmacytoma.  IFE INTERPRETATION     Status: None   Collection Time    04/13/13  1:14 PM      Result Value Range   Immunofix Electr Int *     Comment: Monoclonal IgG kappa protein is present.Reviewed by Dallas Breeding, MD, PhD, FCAP (Electronic Signature onFile)  COMPREHENSIVE METABOLIC PANEL ([facility] REFLEX ONLY)     Status: Abnormal   Collection Time    04/13/13  1:16 PM      Result Value Range   Sodium 136  128 - 145 mEq/L   Potassium 4.5  3.3 - 4.7 mEq/L   Chloride 101  98 - 108 mEq/L   CO2 27  18 - 33 mEq/L   Glucose, Bld 98  73 - 118 mg/dL   BUN, Bld 9  7 - 22 mg/dL   Creat 1.0  0.6 - 1.2 mg/dl   Total Bilirubin 8.29  0.20 - 1.60 mg/dl   Alkaline Phosphatase 74  26 - 84 U/L   AST 37  11 - 38 U/L   ALT(SGPT) 26  10 - 47 U/L   Total Protein 9.5 (*) 6.4 - 8.1 g/dL   Albumin 3.0 (*) 3.3 - 5.5 g/dL   Calcium 9.3  8.0 - 56.2 mg/dL   Assessment/Plan: No problem-specific assessment & plan notes found for this encounter.

## 2013-04-29 ENCOUNTER — Encounter: Payer: Self-pay | Admitting: Family

## 2013-05-02 ENCOUNTER — Encounter: Payer: Self-pay | Admitting: Physician Assistant

## 2013-05-02 ENCOUNTER — Ambulatory Visit (INDEPENDENT_AMBULATORY_CARE_PROVIDER_SITE_OTHER): Payer: BC Managed Care – PPO | Admitting: Physician Assistant

## 2013-05-02 VITALS — BP 106/72 | HR 79 | Temp 98.4°F | Resp 18 | Ht 68.0 in | Wt 194.5 lb

## 2013-05-02 DIAGNOSIS — C9000 Multiple myeloma not having achieved remission: Secondary | ICD-10-CM

## 2013-05-02 DIAGNOSIS — L0291 Cutaneous abscess, unspecified: Secondary | ICD-10-CM

## 2013-05-02 MED ORDER — TRAMADOL HCL 50 MG PO TABS: 50.0000 mg | ORAL_TABLET | Freq: Two times a day (BID) | ORAL | Status: DC | PRN

## 2013-05-02 NOTE — Assessment & Plan Note (Signed)
Refill Tramadol given.  Additional refills to be given by PCP or Oncology.

## 2013-05-02 NOTE — Telephone Encounter (Signed)
Looks like tramadol was refilled today at office visit with Malva Cogan, PA.  Oxycodone last given 03/19/13, #20.  Please advise.

## 2013-05-02 NOTE — Assessment & Plan Note (Signed)
Resolved.  Patient instructed that surrounding skin will continue to soften over the next couple of weeks.  No more follow-up needed.

## 2013-05-02 NOTE — Telephone Encounter (Signed)
This was addressed at today's office visit.

## 2013-05-02 NOTE — Progress Notes (Signed)
Patient ID: Robert Berger, male   DOB: May 26, 1941, 72 y.o.   MRN: [id number]  Patient presents to clinic today for 2nd follow up for abscess and cellulitis of L inguinal region.  Since last visit, patient endorses wound is almost 100% healed.  States that firmness of surrounding skin is improving each day.  Denies fever, chills, erythema.  No concerns related to condition.  Patient also requests refill of tramadol that he takes daily for his chronic pain associated with multiple myeloma.     Past Medical History  Diagnosis Date  . Arthritis   . GERD (gastroesophageal reflux disease)   . Complication of anesthesia     aspiration during surgery, difficult to wake up  . Multiple myeloma   . Status post radiation therapy 03/23/12 - 04/05/12    T8 - L4/ 20 Gy/ 10 Fractions  . Dysphagia 05/06/12    ED Visit    Current Outpatient Prescriptions on File Prior to Visit  Medication Sig Dispense Refill  . aspirin 81 MG tablet Take 81 mg by mouth daily.      . baclofen (LIORESAL) 10 MG tablet Take 1 tablet (10 mg total) by mouth 3 (three) times daily.  90 each  5  . diltiazem (CARDIZEM CD) 120 MG 24 hr capsule Take 1 capsule (120 mg total) by mouth every morning.  30 capsule  5  . lansoprazole (PREVACID) 30 MG capsule Take 1 capsule (30 mg total) by mouth every morning.  30 capsule  5  . Lenalidomide (REVLIMID PO) Take by mouth. 1 capsule daily for 3 weeks then 1 week off      . lisinopril (PRINIVIL,ZESTRIL) 2.5 MG tablet Take 2.5 mg by mouth daily.       . methocarbamol (ROBAXIN) 500 MG tablet Take 500-1,000 mg by mouth 3 (three) times daily as needed (spasm/ pain). For pain/spasms      . ondansetron (ZOFRAN-ODT) 4 MG disintegrating tablet Take 4 mg by mouth every 12 (twelve) hours as needed for nausea.       Marland Kitchen oxyCODONE-acetaminophen (PERCOCET) 5-325 MG per tablet Take 1 tablet by mouth every 4 (four) hours as needed for pain.  20 tablet  0  . polyethylene glycol (MIRALAX / GLYCOLAX) packet Take  17 g by mouth daily as needed (constipation).       . Probiotic Product (PROBIOTIC DAILY PO) Take 1 tablet by mouth every morning.       . Rivaroxaban (XARELTO) 20 MG TABS Take 1 tablet (20 mg total) by mouth every morning.  30 tablet  5   No current facility-administered medications on file prior to visit.    Allergies  Allergen Reactions  . Penicillins Nausea And Vomiting, Swelling and Rash    Family History  Problem Relation Age of Onset  . Cancer Maternal Grandfather   . Cancer Maternal Grandmother     History   Social History  . Marital Status: Significant Other    Spouse Name: N/A    Number of Children: 3  . Years of Education: 14   Occupational History  . Games developer, on medical leave    Social History Main Topics  . Smoking status: Former Smoker    Types: Cigarettes    Quit date: 03/09/1978  . Smokeless tobacco: Never Used  . Alcohol Use: Yes     Comment: 1 glass of wine or a burbon daily., none in the last 3 months  . Drug Use: No  . Sexual Activity: No  Other Topics Concern  . None   Social History Narrative   Divorced x 2, lives with male partner (x 20 years). Normally able to ambulate without assistance.   2 children- both daughters who live in [place]   Completed some college   Enjoys computers, camping trailer.     ROS See HPI  Filed Vitals:   05/02/13 1450  BP: 106/72  Pulse: 79  Temp: 98.4 F (36.9 C)  Resp: 18   Physical Exam  Vitals reviewed. Constitutional: He is oriented to person, place, and time and well-developed, well-nourished, and in no distress.  HENT:  Head: Normocephalic and atraumatic.  Eyes: Conjunctivae are normal.  Neck: Neck supple.  Lymphadenopathy:    He has no cervical adenopathy.  Neurological: He is alert and oriented to person, place, and time.  Skin: Skin is warm and dry.  Wound from prior I/D is closed and has healed well.  No evidence of erythema, tenderness or drainage.  Surrounding tissue is  much softer than previous exam.   Recent Results (from the past 2160 hour(s))  CBC WITH DIFFERENTIAL     Status: Abnormal   Collection Time    02/05/13  1:45 AM      Result Value Range   WBC 5.7  4.0 - 10.5 K/uL   RBC 4.92  4.22 - 5.81 MIL/uL   Hemoglobin 14.8  13.0 - 17.0 g/dL   HCT 16.1  09.6 - 04.5 %   MCV 87.6  78.0 - 100.0 fL   MCH 30.1  26.0 - 34.0 pg   MCHC 34.3  30.0 - 36.0 g/dL   RDW 40.9  81.1 - 91.4 %   Platelets 139 (*) 150 - 400 K/uL   Neutrophils Relative % 72  43 - 77 %   Neutro Abs 4.1  1.7 - 7.7 K/uL   Lymphocytes Relative 18  12 - 46 %   Lymphs Abs 1.0  0.7 - 4.0 K/uL   Monocytes Relative 9  3 - 12 %   Monocytes Absolute 0.5  0.1 - 1.0 K/uL   Eosinophils Relative 1  0 - 5 %   Eosinophils Absolute 0.1  0.0 - 0.7 K/uL   Basophils Relative 0  0 - 1 %   Basophils Absolute 0.0  0.0 - 0.1 K/uL  COMPREHENSIVE METABOLIC PANEL     Status: Abnormal   Collection Time    02/05/13  1:45 AM      Result Value Range   Sodium 138  135 - 145 mEq/L   Potassium 3.9  3.5 - 5.1 mEq/L   Chloride 103  96 - 112 mEq/L   CO2 26  19 - 32 mEq/L   Glucose, Bld 143 (*) 70 - 99 mg/dL   BUN 7  6 - 23 mg/dL   Creatinine, Ser 7.82  0.50 - 1.35 mg/dL   Calcium 9.3  8.4 - 95.6 mg/dL   Total Protein 7.1  6.0 - 8.3 g/dL   Albumin 3.5  3.5 - 5.2 g/dL   AST 22  0 - 37 U/L   ALT 22  0 - 53 U/L   Alkaline Phosphatase 63  39 - 117 U/L   Total Bilirubin 0.6  0.3 - 1.2 mg/dL   GFR calc non Af Amer 88 (*) >90 mL/min   GFR calc Af Amer >90  >90 mL/min   Comment:            The eGFR has been calculated  using the CKD EPI equation.     This calculation has not been     validated in all clinical     situations.     eGFR's persistently     <90 mL/min signify     possible Chronic Kidney Disease.  LIPASE, BLOOD     Status: None   Collection Time    02/05/13  1:45 AM      Result Value Range   Lipase 27  11 - 59 U/L  CG4 I-STAT (LACTIC ACID)     Status: None   Collection Time    02/05/13   1:58 AM      Result Value Range   Lactic Acid, Venous 1.95  0.5 - 2.2 mmol/L  URINALYSIS, ROUTINE W REFLEX MICROSCOPIC     Status: None   Collection Time    02/05/13  4:30 AM      Result Value Range   Color, Urine YELLOW  YELLOW   APPearance CLEAR  CLEAR   Specific Gravity, Urine 1.028  1.005 - 1.030   pH 6.5  5.0 - 8.0   Glucose, UA NEGATIVE  NEGATIVE mg/dL   Hgb urine dipstick NEGATIVE  NEGATIVE   Bilirubin Urine NEGATIVE  NEGATIVE   Ketones, ur NEGATIVE  NEGATIVE mg/dL   Protein, ur NEGATIVE  NEGATIVE mg/dL   Urobilinogen, UA 1.0  0.0 - 1.0 mg/dL   Nitrite NEGATIVE  NEGATIVE   Leukocytes, UA NEGATIVE  NEGATIVE   Comment: MICROSCOPIC NOT DONE ON URINES WITH NEGATIVE PROTEIN, BLOOD, LEUKOCYTES, NITRITE, OR GLUCOSE <1000 mg/dL.  CBC WITH DIFFERENTIAL ([facility])     Status: Abnormal   Collection Time    02/15/13  3:25 PM      Result Value Range   WBC 2.5 (*) 4.0 - 10.0 10e3/uL   RBC 4.97  4.20 - 5.70 10e6/uL   HGB 15.2  13.0 - 17.1 g/dL   HCT 16.1  09.6 - 04.5 %   MCV 92  82 - 98 fL   MCH 30.6  28.0 - 33.4 pg   MCHC 33.2  32.0 - 35.9 g/dL   RDW 40.9  81.1 - 91.4 %   Platelets 163  145 - 400 10e3/uL   NEUT# 1.1 (*) 1.5 - 6.5 10e3/uL   LYMPH# 0.9  0.9 - 3.3 10e3/uL   MONO# 0.4  0.1 - 0.9 10e3/uL   Eosinophils Absolute 0.1  0.0 - 0.5 10e3/uL   BASO# 0.0  0.0 - 0.2 10e3/uL   NEUT% 43.2  40.0 - 80.0 %   LYMPH% 35.3  14.0 - 48.0 %   MONO% 17.5 (*) 0.0 - 13.0 %   EOS% 3.2  0.0 - 7.0 %   BASO% 0.8  0.0 - 2.0 %  COMPREHENSIVE METABOLIC PANEL     Status: Abnormal   Collection Time    02/15/13  3:25 PM      Result Value Range   Sodium 137  135 - 145 mEq/L   Potassium 4.4  3.5 - 5.3 mEq/L   Chloride 103  96 - 112 mEq/L   CO2 25  19 - 32 mEq/L   Glucose, Bld 100 (*) 70 - 99 mg/dL   BUN 9  6 - 23 mg/dL   Creatinine, Ser 7.82  0.50 - 1.35 mg/dL   Total Bilirubin 0.7  0.3 - 1.2 mg/dL   Alkaline Phosphatase 154 (*) 39 - 117 U/L   AST 29  0 - 37 U/L   ALT 35  0 - 53 U/L    Total Protein 6.9  6.0 - 8.3 g/dL   Albumin 3.8  3.5 - 5.2 g/dL   Calcium 9.0  8.4 - 82.9 mg/dL  PROTEIN ELECTROPHORESIS, SERUM, WITH REFLEX     Status: Abnormal   Collection Time    02/15/13  3:25 PM      Result Value Range   Total Protein, Serum Electrophoresis 6.9  6.0 - 8.3 g/dL   Albumin ELP 56.2 (*) 55.8 - 66.1 %   Alpha-1-Globulin 7.9 (*) 2.9 - 4.9 %   Alpha-2-Globulin 13.9 (*) 7.1 - 11.8 %   Beta Globulin 5.7  4.7 - 7.2 %   Beta 2 3.4  3.2 - 6.5 %   Gamma Globulin 18.9 (*) 11.1 - 18.8 %   M-Spike, % 1.01     SPE Interp. *     Comment: A restricted band consistent with monoclonal protein is present.The monoclonal protein peak accounts for 1.01 g/dL of the ZHYQM5.78 g/dL of protein in the gamma region.Results are consistent with SPE performed on 6/24/14Reflex testing will be performed      as indicated.Reviewed by Dallas Breeding, MD, PhD, FCAP (Electronic Signature onFile)   COMMENT (PROTEIN ELECTROPHOR) *     Comment: ---------------Serum protein electrophoresis is a useful screening procedure in thedetection of various pathophysiologic states such as inflammation,gammopathies, protein loss and other dysproteinemias.  Immunofixationelectrophoresis (IFE) is a more      sensitive technique for theidentification of M-proteins found in patients with monoclonalgammopathy of unknown significance (MGUS), amyloidosis, early ortreated myeloma or macroglobulinemia, solitary plasmacytoma orextramedullary plasmacytoma.  KAPPA/LAMBDA LIGHT CHAINS     Status: Abnormal   Collection Time    02/15/13  3:25 PM      Result Value Range   Kappa free light chain 17.10 (*) 0.33 - 1.94 mg/dL   Lambda Free Lght Chn 0.68  0.57 - 2.63 mg/dL   Kappa:Lambda Ratio 46.96 (*) 0.26 - 1.65  IGG, IGA, IGM     Status: Abnormal   Collection Time    02/15/13  3:25 PM      Result Value Range   IgG (Immunoglobin G), Serum 1640 (*) 650 - 1600 mg/dL   IgA 21 (*) 68 - 295 mg/dL   IgM, Serum 37 (*) 41 - 251 mg/dL   IFE INTERPRETATION     Status: None   Collection Time    02/15/13  3:25 PM      Result Value Range   Immunofix Electr Int *     Comment: Monoclonal IgG kappa protein is present.Reviewed by Dallas Breeding, MD, PhD, FCAP (Electronic Signature onFile)  CBC WITH DIFFERENTIAL     Status: Abnormal   Collection Time    02/17/13  7:58 AM      Result Value Range   WBC 3.5 (*) 4.0 - 10.5 K/uL   RBC 4.82  4.22 - 5.81 MIL/uL   Hemoglobin 14.4  13.0 - 17.0 g/dL   HCT 28.4  13.2 - 44.0 %   MCV 89.6  78.0 - 100.0 fL   MCH 29.9  26.0 - 34.0 pg   MCHC 33.3  30.0 - 36.0 g/dL   RDW 10.2  72.5 - 36.6 %   Platelets 146 (*) 150 - 400 K/uL   Neutrophils Relative % 74  43 - 77 %   Neutro Abs 2.6  1.7 - 7.7 K/uL   Lymphocytes Relative 14  12 - 46 %   Lymphs Abs 0.5 (*) 0.7 -  4.0 K/uL   Monocytes Relative 11  3 - 12 %   Monocytes Absolute 0.4  0.1 - 1.0 K/uL   Eosinophils Relative 1  0 - 5 %   Eosinophils Absolute 0.0  0.0 - 0.7 K/uL   Basophils Relative 0  0 - 1 %   Basophils Absolute 0.0  0.0 - 0.1 K/uL  COMPREHENSIVE METABOLIC PANEL     Status: Abnormal   Collection Time    02/17/13  7:58 AM      Result Value Range   Sodium 136  135 - 145 mEq/L   Potassium 4.2  3.5 - 5.1 mEq/L   Chloride 101  96 - 112 mEq/L   CO2 28  19 - 32 mEq/L   Glucose, Bld 123 (*) 70 - 99 mg/dL   BUN 6  6 - 23 mg/dL   Creatinine, Ser 1.61  0.50 - 1.35 mg/dL   Calcium 9.3  8.4 - 09.6 mg/dL   Total Protein 7.3  6.0 - 8.3 g/dL   Albumin 3.1 (*) 3.5 - 5.2 g/dL   AST 85 (*) 0 - 37 U/L   ALT 49  0 - 53 U/L   Alkaline Phosphatase 177 (*) 39 - 117 U/L   Total Bilirubin 2.3 (*) 0.3 - 1.2 mg/dL   GFR calc non Af Amer >90  >90 mL/min   GFR calc Af Amer >90  >90 mL/min   Comment:            The eGFR has been calculated     using the CKD EPI equation.     This calculation has not been     validated in all clinical     situations.     eGFR's persistently     <90 mL/min signify     possible Chronic Kidney Disease.   LIPASE, BLOOD     Status: Abnormal   Collection Time    02/17/13  7:58 AM      Result Value Range   Lipase 84 (*) 11 - 59 U/L  LACTIC ACID, PLASMA     Status: None   Collection Time    02/17/13  7:58 AM      Result Value Range   Lactic Acid, Venous 1.5  0.5 - 2.2 mmol/L  URINALYSIS, ROUTINE W REFLEX MICROSCOPIC     Status: Abnormal   Collection Time    02/17/13  8:20 AM      Result Value Range   Color, Urine AMBER (*) YELLOW   Comment: BIOCHEMICALS MAY BE AFFECTED BY COLOR   APPearance CLEAR  CLEAR   Specific Gravity, Urine 1.022  1.005 - 1.030   pH 6.5  5.0 - 8.0   Glucose, UA NEGATIVE  NEGATIVE mg/dL   Hgb urine dipstick NEGATIVE  NEGATIVE   Bilirubin Urine SMALL (*) NEGATIVE   Ketones, ur NEGATIVE  NEGATIVE mg/dL   Protein, ur NEGATIVE  NEGATIVE mg/dL   Urobilinogen, UA 1.0  0.0 - 1.0 mg/dL   Nitrite NEGATIVE  NEGATIVE   Leukocytes, UA TRACE (*) NEGATIVE  URINE MICROSCOPIC-ADD ON     Status: None   Collection Time    02/17/13  8:20 AM      Result Value Range   WBC, UA 0-2  <3 WBC/hpf   Bacteria, UA RARE  RARE   Urine-Other MUCOUS PRESENT    CBC WITH DIFFERENTIAL ([facility])     Status: Abnormal   Collection Time    03/16/13  9:11 AM  Result Value Range   WBC 3.8 (*) 4.0 - 10.0 10e3/uL   RBC 4.51  4.20 - 5.70 10e6/uL   HGB 14.1  13.0 - 17.1 g/dL   HCT 16.1  09.6 - 04.5 %   MCV 93  82 - 98 fL   MCH 31.3  28.0 - 33.4 pg   MCHC 33.6  32.0 - 35.9 g/dL   RDW 40.9  81.1 - 91.4 %   Platelets 119 (*) 145 - 400 10e3/uL   NEUT# 2.2  1.5 - 6.5 10e3/uL   LYMPH# 0.9  0.9 - 3.3 10e3/uL   MONO# 0.6  0.1 - 0.9 10e3/uL   Eosinophils Absolute 0.1  0.0 - 0.5 10e3/uL   BASO# 0.0  0.0 - 0.2 10e3/uL   NEUT% 56.9  40.0 - 80.0 %   LYMPH% 24.0  14.0 - 48.0 %   MONO% 15.4 (*) 0.0 - 13.0 %   EOS% 3.4  0.0 - 7.0 %   BASO% 0.3  0.0 - 2.0 %  IGG, IGA, IGM     Status: Abnormal   Collection Time    03/16/13  9:11 AM      Result Value Range   IgG (Immunoglobin G), Serum 2660  (*) 650 - 1600 mg/dL   IgA 13 (*) 68 - 782 mg/dL   IgM, Serum 16 (*) 41 - 251 mg/dL  PROTEIN ELECTROPHORESIS, SERUM, WITH REFLEX     Status: Abnormal   Collection Time    03/16/13  9:11 AM      Result Value Range   Total Protein, Serum Electrophoresis 8.1  6.0 - 8.3 g/dL   Albumin ELP 95.6 (*) 55.8 - 66.1 %   Alpha-1-Globulin 3.9  2.9 - 4.9 %   Alpha-2-Globulin 12.8 (*) 7.1 - 11.8 %   Beta Globulin 4.5 (*) 4.7 - 7.2 %   Beta 2 2.9 (*) 3.2 - 6.5 %   Gamma Globulin 28.0 (*) 11.1 - 18.8 %   M-Spike, % 2.00     SPE Interp. *     Comment: A restricted band consistent with monoclonal protein is present.The monoclonal protein peak accounts for 2.00 g/dL of the OZHYQ6.57 g/dL of protein in the gamma region.Results are consistent with SPE performed on 7/11/14Reflex testing will be performed      as indicated. Reviewed by Dallas Breeding, MD, PhD, FCAP (Electronic Signature onFile)   COMMENT (PROTEIN ELECTROPHOR) *     Comment: ---------------Serum protein electrophoresis is a useful screening procedure in thedetection of various pathophysiologic states such as inflammation,gammopathies, protein loss and other dysproteinemias.  Immunofixationelectrophoresis (IFE) is a more      sensitive technique for theidentification of M-proteins found in patients with monoclonalgammopathy of unknown significance (MGUS), amyloidosis, early ortreated myeloma or macroglobulinemia, solitary plasmacytoma orextramedullary plasmacytoma.  KAPPA/LAMBDA LIGHT CHAINS     Status: Abnormal   Collection Time    03/16/13  9:11 AM      Result Value Range   Kappa free light chain 45.00 (*) 0.33 - 1.94 mg/dL   Lambda Free Lght Chn 0.57  0.57 - 2.63 mg/dL   Kappa:Lambda Ratio 84.69 (*) 0.26 - 1.65  COMPREHENSIVE METABOLIC PANEL ([facility] REFLEX ONLY)     Status: Abnormal   Collection Time    03/16/13  9:11 AM      Result Value Range   Sodium 138  128 - 145 mEq/L   Potassium 4.0  3.3 - 4.7 mEq/L   Chloride 103  98 - 108  mEq/L  CO2 27  18 - 33 mEq/L   Glucose, Bld 148 (*) 73 - 118 mg/dL   BUN, Bld 6 (*) 7 - 22 mg/dL   Creat 0.9  0.6 - 1.2 mg/dl   Total Bilirubin 1.61  0.20 - 1.60 mg/dl   Alkaline Phosphatase 62  26 - 84 U/L   AST 31  11 - 38 U/L   ALT(SGPT) 29  10 - 47 U/L   Total Protein 8.1  6.4 - 8.1 g/dL   Albumin 3.2 (*) 3.3 - 5.5 g/dL   Calcium 8.8  8.0 - 09.6 mg/dL  IFE INTERPRETATION     Status: None   Collection Time    03/16/13  9:11 AM      Result Value Range   Immunofix Electr Int *     Comment: Monoclonal IgG kappa protein is present.Area of slightly restricted mobility in the IgG and lambda lanes.Suggest repeat in 6-8 months, if clinically indicated. Reviewed by Dallas Breeding, MD, PhD, FCAP (Electronic Signature onFile)  BASIC METABOLIC PANEL     Status: Abnormal   Collection Time    03/18/13 11:34 PM      Result Value Range   Sodium 138  135 - 145 mEq/L   Potassium 3.7  3.5 - 5.1 mEq/L   Chloride 103  96 - 112 mEq/L   CO2 26  19 - 32 mEq/L   Glucose, Bld 141 (*) 70 - 99 mg/dL   BUN 7  6 - 23 mg/dL   Creatinine, Ser 0.45  0.50 - 1.35 mg/dL   Calcium 8.7  8.4 - 40.9 mg/dL   GFR calc non Af Amer 85 (*) >90 mL/min   GFR calc Af Amer >90  >90 mL/min   Comment:            The eGFR has been calculated     using the CKD EPI equation.     This calculation has not been     validated in all clinical     situations.     eGFR's persistently     <90 mL/min signify     possible Chronic Kidney Disease.  CBC WITH DIFFERENTIAL     Status: Abnormal   Collection Time    03/18/13 11:34 PM      Result Value Range   WBC 6.3  4.0 - 10.5 K/uL   RBC 4.39  4.22 - 5.81 MIL/uL   Hemoglobin 13.9  13.0 - 17.0 g/dL   HCT 81.1  91.4 - 78.2 %   MCV 90.4  78.0 - 100.0 fL   MCH 31.7  26.0 - 34.0 pg   MCHC 35.0  30.0 - 36.0 g/dL   RDW 95.6 (*) 21.3 - 08.6 %   Platelets 130 (*) 150 - 400 K/uL   Neutrophils Relative % 53  43 - 77 %   Lymphocytes Relative 28  12 - 46 %   Monocytes Relative 17 (*)  3 - 12 %   Eosinophils Relative 1  0 - 5 %   Basophils Relative 1  0 - 1 %   Neutro Abs 3.2  1.7 - 7.7 K/uL   Lymphs Abs 1.8  0.7 - 4.0 K/uL   Monocytes Absolute 1.1 (*) 0.1 - 1.0 K/uL   Eosinophils Absolute 0.1  0.0 - 0.7 K/uL   Basophils Absolute 0.1  0.0 - 0.1 K/uL   WBC Morphology ATYPICAL LYMPHOCYTES    HEPATIC FUNCTION PANEL     Status: Abnormal  Collection Time    03/18/13 11:39 PM      Result Value Range   Total Protein 8.1  6.0 - 8.3 g/dL   Albumin 3.3 (*) 3.5 - 5.2 g/dL   AST 73 (*) 0 - 37 U/L   ALT 38  0 - 53 U/L   Alkaline Phosphatase 92  39 - 117 U/L   Total Bilirubin 1.4 (*) 0.3 - 1.2 mg/dL   Bilirubin, Direct 0.9 (*) 0.0 - 0.3 mg/dL   Indirect Bilirubin 0.5  0.3 - 0.9 mg/dL  CG4 I-STAT (LACTIC ACID)     Status: None   Collection Time    03/18/13 11:53 PM      Result Value Range   Lactic Acid, Venous 1.72  0.5 - 2.2 mmol/L  URINALYSIS, ROUTINE W REFLEX MICROSCOPIC     Status: Abnormal   Collection Time    03/19/13 12:12 AM      Result Value Range   Color, Urine AMBER (*) YELLOW   Comment: BIOCHEMICALS MAY BE AFFECTED BY COLOR   APPearance CLEAR  CLEAR   Specific Gravity, Urine 1.018  1.005 - 1.030   pH 6.5  5.0 - 8.0   Glucose, UA NEGATIVE  NEGATIVE mg/dL   Hgb urine dipstick NEGATIVE  NEGATIVE   Bilirubin Urine NEGATIVE  NEGATIVE   Ketones, ur NEGATIVE  NEGATIVE mg/dL   Protein, ur 30 (*) NEGATIVE mg/dL   Urobilinogen, UA 1.0  0.0 - 1.0 mg/dL   Nitrite NEGATIVE  NEGATIVE   Leukocytes, UA NEGATIVE  NEGATIVE  URINE MICROSCOPIC-ADD ON     Status: None   Collection Time    03/19/13 12:12 AM      Result Value Range   Urine-Other MUCOUS PRESENT    CULTURE, BLOOD (ROUTINE X 2)     Status: None   Collection Time    04/08/13 10:30 PM      Result Value Range   Specimen Description BLOOD LEFT ARM     Special Requests BOTTLES DRAWN AEROBIC AND ANAEROBIC 5CC EACH     Culture  Setup Time       Value: 04/09/2013 10:30     Performed at [facility]    Culture       Value: NO GROWTH 5 DAYS     Performed at [facility]   Report Status 04/16/2013 FINAL    CULTURE, BLOOD (ROUTINE X 2)     Status: None   Collection Time    04/08/13 10:45 PM      Result Value Range   Specimen Description BLOOD RIGHT HAND     Special Requests BOTTLES DRAWN AEROBIC AND ANAEROBIC 5CC EACH     Culture  Setup Time       Value: 04/09/2013 10:30     Performed at [facility]   Culture       Value: NO GROWTH 5 DAYS     Performed at [facility]   Report Status 04/16/2013 FINAL    CBC WITH DIFFERENTIAL     Status: Abnormal   Collection Time    04/08/13 10:49 PM      Result Value Range   WBC 8.7  4.0 - 10.5 K/uL   RBC 3.71 (*) 4.22 - 5.81 MIL/uL   Hemoglobin 11.9 (*) 13.0 - 17.0 g/dL   HCT 47.8 (*) 29.5 - 62.1 %   MCV 94.9  78.0 - 100.0 fL   MCH 32.1  26.0 - 34.0 pg   MCHC  33.8  30.0 - 36.0 g/dL   RDW 96.0 (*) 45.4 - 09.8 %   Platelets 111 (*) 150 - 400 K/uL   Comment: REPEATED TO VERIFY     SPECIMEN CHECKED FOR CLOTS   Neutrophils Relative % 49  43 - 77 %   Neutro Abs 4.3  1.7 - 7.7 K/uL   Lymphocytes Relative 26  12 - 46 %   Lymphs Abs 2.2  0.7 - 4.0 K/uL   Monocytes Relative 25 (*) 3 - 12 %   Monocytes Absolute 2.1 (*) 0.1 - 1.0 K/uL   Eosinophils Relative 1  0 - 5 %   Eosinophils Absolute 0.1  0.0 - 0.7 K/uL   Basophils Relative 0  0 - 1 %   Basophils Absolute 0.0  0.0 - 0.1 K/uL   WBC Morphology WHITE COUNT CONFIRMED ON SMEAR     Comment: ATYPICAL LYMPHOCYTES   Smear Review PLATELET COUNT CONFIRMED BY SMEAR    COMPREHENSIVE METABOLIC PANEL     Status: Abnormal   Collection Time    04/08/13 10:49 PM      Result Value Range   Sodium 135  135 - 145 mEq/L   Potassium 4.5  3.5 - 5.1 mEq/L   Chloride 102  96 - 112 mEq/L   CO2 26  19 - 32 mEq/L   Glucose, Bld 125 (*) 70 - 99 mg/dL   BUN 10  6 - 23 mg/dL   Creatinine, Ser 1.19  0.50 - 1.35 mg/dL   Calcium 9.5  8.4 - 14.7 mg/dL   Total Protein 9.1 (*) 6.0 - 8.3 g/dL    Albumin 2.8 (*) 3.5 - 5.2 g/dL   AST 31  0 - 37 U/L   ALT 18  0 - 53 U/L   Alkaline Phosphatase 99  39 - 117 U/L   Total Bilirubin 0.7  0.3 - 1.2 mg/dL   GFR calc non Af Amer 83 (*) >90 mL/min   GFR calc Af Amer >90  >90 mL/min   Comment: (NOTE)     The eGFR has been calculated using the CKD EPI equation.     This calculation has not been validated in all clinical situations.     eGFR's persistently <90 mL/min signify possible Chronic Kidney     Disease.  CBC WITH DIFFERENTIAL ([facility])     Status: Abnormal   Collection Time    04/13/13 12:10 PM      Result Value Range   WBC 6.5  4.0 - 10.0 10e3/uL   RBC 3.54 (*) 4.20 - 5.70 10e6/uL   HGB 11.5 (*) 13.0 - 17.1 g/dL   HCT 82.9 (*) 56.2 - 13.0 %   MCV 97  82 - 98 fL   MCH 32.5  28.0 - 33.4 pg   MCHC 33.4  32.0 - 35.9 g/dL   RDW 86.5 (*) 78.4 - 69.6 %   Platelets 156  145 - 400 10e3/uL  MANUAL DIFFERENTIAL ([facility])     Status: Abnormal   Collection Time    04/13/13 12:10 PM      Result Value Range   ANC ([facility] manual diff) 2.7  1.5 - 6.5 10e3/uL   ALC 2.1  0.9 - 3.3 10e3/uL   SEG 41  40 - 75 %   Band Neutrophils 1  0 - 10 %   LYMPH 33  14 - 48 %   MONO 7  0 - 13 %   Eos 3  0 - 7 %   Other Cells   (*) 0 - 0 %   Value: 15 Atypical Mononuclear cells(Plasma cells and large mononuclear)   Polychromasia Slight  Slight   Ovalocyte Few  Negative   RBC Comments Rouleaux present     WBC Comment Variant Lymphs(some are plasmacytoid)     PLT EST Capulin Adequate  Adequate  IGG, IGA, IGM     Status: Abnormal   Collection Time    04/13/13  1:14 PM      Result Value Range   IgG (Immunoglobin G), Serum 3750 (*) 650 - 1600 mg/dL   IgA 9 (*) 68 - 846 mg/dL   IgM, Serum 16 (*) 41 - 251 mg/dL  KAPPA/LAMBDA LIGHT CHAINS     Status: Abnormal   Collection Time    04/13/13  1:14 PM      Result Value Range   Kappa free light chain 96.90 (*) 0.33 - 1.94 mg/dL   Lambda Free Lght Chn 0.18 (*) 0.57 - 2.63 mg/dL   Kappa:Lambda Ratio  962.95 (*) 0.26 - 1.65  PROTEIN ELECTROPHORESIS, SERUM, WITH REFLEX     Status: Abnormal   Collection Time    04/13/13  1:14 PM      Result Value Range   Total Protein, Serum Electrophoresis 8.7 (*) 6.0 - 8.3 g/dL   Albumin ELP 28.4 (*) 55.8 - 66.1 %   Alpha-1-Globulin 4.2  2.9 - 4.9 %   Alpha-2-Globulin 14.1 (*) 7.1 - 11.8 %   Beta Globulin 4.7  4.7 - 7.2 %   Beta 2 2.4 (*) 3.2 - 6.5 %   Gamma Globulin 28.8 (*) 11.1 - 18.8 %   M-Spike, % 2.31     SPE Interp. *     Comment: A restricted band consistent with monoclonal protein is present.The monoclonal protein peak accounts for 2.31 g/dL of the XLKGM0.10 g/dL of protein in the gamma region.Results are consistent with SPE performed on 8/11/14Reflex testing will be performed      as indicated.Reviewed by Dallas Breeding, MD, PhD, FCAP (Electronic Signature onFile)   COMMENT (PROTEIN ELECTROPHOR) *     Comment: ---------------Serum protein electrophoresis is a useful screening procedure in thedetection of various pathophysiologic states such as inflammation,gammopathies, protein loss and other dysproteinemias.  Immunofixationelectrophoresis (IFE) is a more      sensitive technique for theidentification of M-proteins found in patients with monoclonalgammopathy of unknown significance (MGUS), amyloidosis, early ortreated myeloma or macroglobulinemia, solitary plasmacytoma orextramedullary plasmacytoma.  IFE INTERPRETATION     Status: None   Collection Time    04/13/13  1:14 PM      Result Value Range   [employer]ix Electr Int *     Comment: Monoclonal IgG kappa protein is present.Reviewed by Dallas Breeding, MD, PhD, FCAP (Electronic Signature onFile)  COMPREHENSIVE METABOLIC PANEL ([facility] REFLEX ONLY)     Status: Abnormal   Collection Time    04/13/13  1:16 PM      Result Value Range   Sodium 136  128 - 145 mEq/L   Potassium 4.5  3.3 - 4.7 mEq/L   Chloride 101  98 - 108 mEq/L   CO2 27  18 - 33 mEq/L   Glucose, Bld 98  73 - 118 mg/dL    BUN, Bld 9  7 - 22 mg/dL   Creat 1.0  0.6 - 1.2 mg/dl   Total Bilirubin 2.72  0.20 - 1.60 mg/dl   Alkaline Phosphatase 74  26 - 84  U/L   AST 37  11 - 38 U/L   ALT(SGPT) 26  10 - 47 U/L   Total Protein 9.5 (*) 6.4 - 8.1 g/dL   Albumin 3.0 (*) 3.3 - 5.5 g/dL   Calcium 9.3  8.0 - 40.9 mg/dL    Assessment/Plan: Cellulitis and abscess Resolved.  Patient instructed that surrounding skin will continue to soften over the next couple of weeks.  No more follow-up needed.  Multiple myeloma Refill Tramadol given.  Additional refills to be given by PCP or Oncology.

## 2013-05-08 ENCOUNTER — Encounter: Payer: Self-pay | Admitting: Hematology & Oncology

## 2013-05-09 ENCOUNTER — Other Ambulatory Visit: Payer: Self-pay | Admitting: *Deleted

## 2013-05-09 DIAGNOSIS — C9000 Multiple myeloma not having achieved remission: Secondary | ICD-10-CM

## 2013-05-09 MED ORDER — LENALIDOMIDE 25 MG PO CAPS: 25.0000 mg | ORAL_CAPSULE | Freq: Every day | ORAL | Status: DC

## 2013-05-09 NOTE — Telephone Encounter (Signed)
Received refill request from [facility] for Revlimid 25 mg - 21 days on 7 days off. Obtained auth # [id number] from [employer]. Will fax back to [phone] once rx is signed by Dr Myna Hidalgo.

## 2013-05-11 ENCOUNTER — Encounter: Payer: Self-pay | Admitting: Family

## 2013-05-14 ENCOUNTER — Other Ambulatory Visit: Payer: BC Managed Care – PPO | Admitting: Lab

## 2013-05-14 ENCOUNTER — Ambulatory Visit (HOSPITAL_BASED_OUTPATIENT_CLINIC_OR_DEPARTMENT_OTHER): Payer: BC Managed Care – PPO | Admitting: Hematology & Oncology

## 2013-05-14 ENCOUNTER — Ambulatory Visit (HOSPITAL_BASED_OUTPATIENT_CLINIC_OR_DEPARTMENT_OTHER): Payer: BC Managed Care – PPO

## 2013-05-14 VITALS — BP 121/65 | HR 83 | Temp 98.2°F | Resp 16 | Ht 68.0 in | Wt 195.0 lb

## 2013-05-14 DIAGNOSIS — C9000 Multiple myeloma not having achieved remission: Secondary | ICD-10-CM

## 2013-05-14 DIAGNOSIS — M549 Dorsalgia, unspecified: Secondary | ICD-10-CM

## 2013-05-14 DIAGNOSIS — Z86718 Personal history of other venous thrombosis and embolism: Secondary | ICD-10-CM

## 2013-05-14 DIAGNOSIS — Z86711 Personal history of pulmonary embolism: Secondary | ICD-10-CM

## 2013-05-14 DIAGNOSIS — M4810 Ankylosing hyperostosis [Forestier], site unspecified: Secondary | ICD-10-CM

## 2013-05-14 MED ORDER — OXYCODONE-ACETAMINOPHEN 5-325 MG PO TABS: 1.0000 | ORAL_TABLET | ORAL | Status: DC | PRN

## 2013-05-14 MED ORDER — DEXAMETHASONE SODIUM PHOSPHATE 20 MG/5ML IJ SOLN
40.0000 mg | Freq: Once | INTRAMUSCULAR | Status: AC
  Administered 2013-05-14: 40 mg via INTRAVENOUS

## 2013-05-14 MED ORDER — SODIUM CHLORIDE 0.9 % IV SOLN
INTRAVENOUS | Status: DC
  Administered 2013-05-14: 13:00:00 via INTRAVENOUS

## 2013-05-14 MED ORDER — DEXAMETHASONE SODIUM PHOSPHATE 20 MG/5ML IJ SOLN
INTRAMUSCULAR | Status: AC
  Filled 2013-05-14: qty 10

## 2013-05-14 MED ORDER — ZOLEDRONIC ACID 4 MG/100ML IV SOLN
4.0000 mg | Freq: Once | INTRAVENOUS | Status: AC
  Administered 2013-05-14: 4 mg via INTRAVENOUS
  Filled 2013-05-14: qty 100

## 2013-05-14 NOTE — Patient Instructions (Signed)
Zoledronic Acid injection (Hypercalcemia, Oncology) What is this medicine? ZOLEDRONIC ACID (ZOE le dron ik AS id) lowers the amount of calcium loss from bone. It is used to treat too much calcium in your blood from cancer. It is also used to prevent complications of cancer that has spread to the bone. This medicine may be used for other purposes; ask your health care provider or pharmacist if you have questions. What should I tell my health care provider before I take this medicine? They need to know if you have any of these conditions: -aspirin-sensitive asthma -dental disease -kidney disease -an unusual or allergic reaction to zoledronic acid, other medicines, foods, dyes, or preservatives -pregnant or trying to get pregnant -breast-feeding How should I use this medicine? This medicine is for infusion into a vein. It is given by a health care professional in a hospital or clinic setting. Talk to your pediatrician regarding the use of this medicine in children. Special care may be needed. Overdosage: If you think you have taken too much of this medicine contact a poison control center or emergency room at once. NOTE: This medicine is only for you. Do not share this medicine with others. What if I miss a dose? It is important not to miss your dose. Call your doctor or health care professional if you are unable to keep an appointment. What may interact with this medicine? -certain antibiotics given by injection -NSAIDs, medicines for pain and inflammation, like ibuprofen or naproxen -some diuretics like bumetanide, furosemide -teriparatide -thalidomide This list may not describe all possible interactions. Give your health care provider a list of all the medicines, herbs, non-prescription drugs, or dietary supplements you use. Also tell them if you smoke, drink alcohol, or use illegal drugs. Some items may interact with your medicine. What should I watch for while using this medicine? Visit  your doctor or health care professional for regular checkups. It may be some time before you see the benefit from this medicine. Do not stop taking your medicine unless your doctor tells you to. Your doctor may order blood tests or other tests to see how you are doing. Women should inform their doctor if they wish to become pregnant or think they might be pregnant. There is a potential for serious side effects to an unborn child. Talk to your health care professional or pharmacist for more information. You should make sure that you get enough calcium and vitamin D while you are taking this medicine. Discuss the foods you eat and the vitamins you take with your health care professional. Some people who take this medicine have severe bone, joint, and/or muscle pain. This medicine may also increase your risk for a broken thigh bone. Tell your doctor right away if you have pain in your upper leg or groin. Tell your doctor if you have any pain that does not go away or that gets worse. What side effects may I notice from receiving this medicine? Side effects that you should report to your doctor or health care professional as soon as possible: -allergic reactions like skin rash, itching or hives, swelling of the face, lips, or tongue -anxiety, confusion, or depression -breathing problems -changes in vision -feeling faint or lightheaded, falls -jaw burning, cramping, pain -muscle cramps, stiffness, or weakness -trouble passing urine or change in the amount of urine Side effects that usually do not require medical attention (report to your doctor or health care professional if they continue or are bothersome): -bone, joint, or muscle pain -  fever -hair loss -irritation at site where injected -loss of appetite -nausea, vomiting -stomach upset -tired This list may not describe all possible side effects. Call your doctor for medical advice about side effects. You may report side effects to FDA at  1-800-FDA-1088. Where should I keep my medicine? This drug is given in a hospital or clinic and will not be stored at home. NOTE: This sheet is a summary. It may not cover all possible information. If you have questions about this medicine, talk to your doctor, pharmacist, or health care provider.  2013, [employer]. (01/22/2011 9:06:58 AM)  

## 2013-05-15 ENCOUNTER — Ambulatory Visit: Payer: BC Managed Care – PPO | Admitting: Family

## 2013-05-15 LAB — BASIC METABOLIC PANEL
CO2: 24 mEq/L (ref 19–32)
Chloride: 108 mEq/L (ref 96–112)
Glucose, Bld: 111 mg/dL — ABNORMAL HIGH (ref 70–99)
Sodium: 140 mEq/L (ref 135–145)

## 2013-05-15 NOTE — Progress Notes (Signed)
DIAGNOSES: 1. IgG kappa myeloma. 2. History of deep vein thrombosis/pulmonary embolism. 3. Perforated intestine, status post laparotomy with colostomy bag.  CURRENT THERAPY: 1. Revlimid 20 mg p.o. daily (21 days on/7 days off).  Patient not     tolerant of this dose, so we will decrease to 15 mg p.o. daily. 2. Zometa 4 mg IV monthly. 3. Xarelto 20 mg p.o. daily.  INTERIM HISTORY:  Robert Berger comes in for a followup.  Unfortunately, his tolerance to Revlimid is not going to be that good.  He says it just makes him feel very tired.  He has had no nausea or vomiting with it. He says he just has a hard time functioning because he gets so tired. As such, we are probably have to cut his dose back to 15 mg a day. He just got a new shipment in.  I told him to take 1 Revlimid every other day for 3 weeks.  When we last checked his monoclonal levels, his monoclonal spike was 2.31 g/dL.  His IgG level was 3750 mg/dL.  Kappa light chain was 96.9 mg/dL.  He has had no problems with his colostomy bag.  He has had no urinary issues.  His back bothers him, but this is really no different than what it has been past.  He does state he gets some abdominal cramps, which does bother him.  He has had no cough.  He has had no bleeding.  He has had no leg swelling.  PHYSICAL EXAMINATION:  General:  This is an elderly-appearing white gentleman in no obvious distress.  Vital signs:  Temperature of 98.2, pulse 83, respiratory rate 16, blood pressure 121/65.  Weight is 195 pounds.  Head and neck:  Normocephalic, atraumatic skull.  There are no ocular or oral lesions.  There are no palpable cervical or supraclavicular lymph nodes.  Lungs:  Clear bilaterally.  Cardiac: Regular rate and rhythm with a normal S1 and S2.  There are no murmurs, rubs or bruits.  Abdomen:  Soft.  His colostomy bag is intact.  He has no fluid wave.  There is no guarding or rebound tenderness.  He has no palpable abdominal mass.   There is __________ palpable hepatosplenomegaly.  Back:  No tenderness over the spine, ribs, or hips. Extremities:  No clubbing, cyanosis or edema.  Neurologic:  No focal neurological deficits.  Skin:  No rashes, ecchymosis, or petechia.  On the dorsum of his left hand, s he has an excoriation.  His labs are still pending today.  IMPRESSION:  Mr. Cedillos is a 72 year old gentleman with IgG kappa myeloma.  He was not too tolerant of __________.  We will have to cut his dose of Revlimid back to 15 mg a day.  I still think that the Revlimid will work.  We just have to give it to him and get it into his system.  We will plan to get him back in another month or so.  We will give him the Zometa and Decadron today.    ______________________________ [name], M.D. PRE/MEDQ  D:  05/14/2013  T:  05/15/2013  Job:  [id number]

## 2013-05-15 NOTE — Progress Notes (Signed)
This office note has been dictated.

## 2013-05-16 ENCOUNTER — Telehealth: Payer: Self-pay | Admitting: Hematology & Oncology

## 2013-05-16 ENCOUNTER — Encounter: Payer: Self-pay | Admitting: Hematology & Oncology

## 2013-05-16 NOTE — Telephone Encounter (Signed)
Left message with 11-13 appointment

## 2013-05-18 ENCOUNTER — Encounter: Payer: Self-pay | Admitting: Hematology & Oncology

## 2013-05-20 ENCOUNTER — Encounter: Payer: Self-pay | Admitting: Hematology & Oncology

## 2013-05-25 ENCOUNTER — Encounter: Payer: Self-pay | Admitting: Family

## 2013-05-25 ENCOUNTER — Ambulatory Visit (INDEPENDENT_AMBULATORY_CARE_PROVIDER_SITE_OTHER): Payer: BC Managed Care – PPO | Admitting: Family

## 2013-05-25 VITALS — BP 110/80 | HR 98 | Temp 98.1°F | Resp 18 | Ht 68.0 in | Wt 198.1 lb

## 2013-05-25 DIAGNOSIS — I2699 Other pulmonary embolism without acute cor pulmonale: Secondary | ICD-10-CM

## 2013-05-25 DIAGNOSIS — L0291 Cutaneous abscess, unspecified: Secondary | ICD-10-CM

## 2013-05-25 DIAGNOSIS — I10 Essential (primary) hypertension: Secondary | ICD-10-CM

## 2013-05-25 DIAGNOSIS — E119 Type 2 diabetes mellitus without complications: Secondary | ICD-10-CM

## 2013-05-25 DIAGNOSIS — Z23 Encounter for immunization: Secondary | ICD-10-CM

## 2013-05-25 MED ORDER — DOXYCYCLINE HYCLATE 100 MG PO TABS: 100.0000 mg | ORAL_TABLET | Freq: Two times a day (BID) | ORAL | Status: DC

## 2013-05-25 NOTE — Progress Notes (Signed)
Subjective:    Patient ID: Robert Berger, male    DOB: June 30, 1941, 72 y.o.   MRN: [id number]  HPI  Robert Berger is a 72 yr old male who presents today for follow up.  He reports abscess at the base of his penis since last night.    DM2- reports that he is "not diabetic."  Chart review notes A1C 7.5 back in 2013. Recent A1C was normal. He has lost 30 pounds since the elevated A1C back in 2013.  HTN-  Currently maintained on lisinopril.  PE- maintained on xarelto.    Review of Systems See HPI  Past Medical History  Diagnosis Date  . Arthritis   . GERD (gastroesophageal reflux disease)   . Complication of anesthesia     aspiration during surgery, difficult to wake up  . Multiple myeloma   . Status post radiation therapy 03/23/12 - 04/05/12    T8 - L4/ 20 Gy/ 10 Fractions  . Dysphagia 05/06/12    ED Visit    History   Social History  . Marital Status: Significant Other    Spouse Name: N/A    Number of Children: 3  . Years of Education: 14   Occupational History  . Games developer, on medical leave    Social History Main Topics  . Smoking status: Former Smoker    Types: Cigarettes    Quit date: 03/09/1978  . Smokeless tobacco: Never Used  . Alcohol Use: Yes     Comment: 1 glass of wine or a burbon daily., none in the last 3 months  . Drug Use: No  . Sexual Activity: No   Other Topics Concern  . Not on file   Social History Narrative   Divorced x 2, lives with male partner (x 20 years). Normally able to ambulate without assistance.   2 children- both daughters who live in [place]   Completed some college   Enjoys computers, camping trailer.      Past Surgical History  Procedure Laterality Date  . Hemorrhoid surgery    . Laparotomy  05/14/2012    Procedure: EXPLORATORY LAPAROTOMY;  Surgeon: Velora Heckler, MD;  Location: [facility];  Service: General;  Laterality: N/A;  . Colostomy  05/14/2012    Procedure: COLOSTOMY;  Surgeon: Velora Heckler, MD;  Location: [facility]  [facility];  Service: General;  Laterality: N/A;  . Back surgery      47 years ago  . Fracture surgery      ankle    Family History  Problem Relation Age of Onset  . Cancer Maternal Grandfather   . Cancer Maternal Grandmother     Allergies  Allergen Reactions  . Penicillins Nausea And Vomiting, Swelling and Rash    Current Outpatient Prescriptions on File Prior to Visit  Medication Sig Dispense Refill  . aspirin 81 MG tablet Take 81 mg by mouth daily.      . baclofen (LIORESAL) 10 MG tablet Take 1 tablet (10 mg total) by mouth 3 (three) times daily.  90 each  5  . diltiazem (CARDIZEM CD) 120 MG 24 hr capsule Take 1 capsule (120 mg total) by mouth every morning.  30 capsule  5  . lansoprazole (PREVACID) 30 MG capsule Take 1 capsule (30 mg total) by mouth every morning.  30 capsule  5  . lisinopril (PRINIVIL,ZESTRIL) 2.5 MG tablet Take 2.5 mg by mouth daily.       . methocarbamol (ROBAXIN) 500 MG tablet Take 500-1,000 mg  by mouth 3 (three) times daily as needed (spasm/ pain). For pain/spasms      . ondansetron (ZOFRAN-ODT) 4 MG disintegrating tablet Take 4 mg by mouth every 12 (twelve) hours as needed for nausea.       Marland Kitchen oxyCODONE-acetaminophen (PERCOCET) 5-325 MG per tablet Take 1 tablet by mouth every 4 (four) hours as needed for pain.  90 tablet  0  . polyethylene glycol (MIRALAX / GLYCOLAX) packet Take 17 g by mouth daily as needed (constipation).       . Rivaroxaban (XARELTO) 20 MG TABS Take 1 tablet (20 mg total) by mouth every morning.  30 tablet  5   No current facility-administered medications on file prior to visit.    BP 110/80  Pulse 98  Temp(Src) 98.1 F (36.7 C) (Oral)  Resp 18  Ht 5\' 8"  (1.727 m)  Wt 198 lb 1.3 oz (89.848 kg)  BMI 30.12 kg/m2  SpO2 98%       Objective:   Physical Exam  Constitutional: He is oriented to person, place, and time. He appears well-developed and well-nourished. No distress.  HENT:  Head: Normocephalic and atraumatic.   Cardiovascular: Normal rate and regular rhythm.   No murmur heard. Pulmonary/Chest: Effort normal and breath sounds normal. No respiratory distress. He has no wheezes. He has no rales. He exhibits no tenderness.  Musculoskeletal: He exhibits no edema.  Neurological: He is alert and oriented to person, place, and time.  Skin:  Small "pimple" at the base of penis, with small amount of surrounding erythema.   Psychiatric: He has a normal mood and affect. His behavior is normal. Judgment and thought content normal.          Assessment & Plan:

## 2013-05-25 NOTE — Patient Instructions (Signed)
Apply warm compresses to affected area twice daily. Call if increased pain/swelling, or if no improvement in 2-3 days. Follow up in 3 months.  Come fasting to this appointment. Sooner if problems/concerns.

## 2013-05-27 ENCOUNTER — Encounter: Payer: Self-pay | Admitting: Family

## 2013-05-27 DIAGNOSIS — C9000 Multiple myeloma not having achieved remission: Secondary | ICD-10-CM

## 2013-05-28 DIAGNOSIS — E669 Obesity, unspecified: Secondary | ICD-10-CM | POA: Insufficient documentation

## 2013-05-28 MED ORDER — TRAMADOL HCL 50 MG PO TABS: 50.0000 mg | ORAL_TABLET | Freq: Two times a day (BID) | ORAL | Status: DC | PRN

## 2013-05-28 NOTE — Assessment & Plan Note (Signed)
BP stable on low dose ace.  BP Readings from Last 3 Encounters:  05/25/13 110/80  05/14/13 121/65  05/02/13 106/72

## 2013-05-28 NOTE — Assessment & Plan Note (Addendum)
Clinically stable on xarelto.  Diagnosed with PE/DVT back in 10/13.  Will review needs for ongoing anticoagulation with Dr. Myna Hidalgo.

## 2013-05-28 NOTE — Assessment & Plan Note (Signed)
Diet controlled. We discussed that he does have diabetes as evidenced by prior A1C, but that due to his weight loss and dietary changes is no longer in the diabetic range. Monitor.

## 2013-05-28 NOTE — Assessment & Plan Note (Signed)
Very superficial early abscess noted at base of penis. Will rx with doxycycline and advised warm compresses twice daily. Follow up if symptoms worsen or if symptoms do not improve.

## 2013-05-29 ENCOUNTER — Telehealth: Payer: Self-pay | Admitting: Family

## 2013-05-29 NOTE — Telephone Encounter (Signed)
Left message on home # to return my call. 

## 2013-05-29 NOTE — Telephone Encounter (Addendum)
Please call pt and let him know that I reviewed his ongoing needs for xarelto with Dr. Myna Hidalgo.  Since he has the ivc filter and he has been on xarelto now for 1 year, he feels that he can discontinue xarelto.

## 2013-05-30 MED ORDER — DOXYCYCLINE HYCLATE 100 MG PO TABS: 100.0000 mg | ORAL_TABLET | Freq: Two times a day (BID) | ORAL | Status: DC

## 2013-05-30 NOTE — Telephone Encounter (Signed)
Notified pt and he voices understanding. Pt states abcess at the base of his penis doesn't seem to be getting any better but doesn't feel it is any worse. He will complete antibiotic by the end of the week and wants to know if we should extend course or abx?  Please advise.

## 2013-05-30 NOTE — Telephone Encounter (Signed)
Extend doxy for 10 days total.  6 additional tabs sent to pharmacy. Follow up Monday if not improved, sooner if worsens.

## 2013-05-30 NOTE — Telephone Encounter (Signed)
Patient returned phone call. Best # [phone]

## 2013-06-01 NOTE — Telephone Encounter (Signed)
Notified pt and he voices understanding. States that he has noticed some slight improvement since we last talked.

## 2013-06-08 ENCOUNTER — Other Ambulatory Visit: Payer: Self-pay | Admitting: *Deleted

## 2013-06-08 DIAGNOSIS — C9000 Multiple myeloma not having achieved remission: Secondary | ICD-10-CM

## 2013-06-08 MED ORDER — LENALIDOMIDE 15 MG PO CAPS: 15.0000 mg | ORAL_CAPSULE | Freq: Every day | ORAL | Status: DC

## 2013-06-10 ENCOUNTER — Encounter: Payer: Self-pay | Admitting: Hematology & Oncology

## 2013-06-21 ENCOUNTER — Other Ambulatory Visit (HOSPITAL_BASED_OUTPATIENT_CLINIC_OR_DEPARTMENT_OTHER): Payer: BC Managed Care – PPO | Admitting: Lab

## 2013-06-21 ENCOUNTER — Ambulatory Visit (HOSPITAL_BASED_OUTPATIENT_CLINIC_OR_DEPARTMENT_OTHER): Payer: BC Managed Care – PPO | Admitting: Hematology & Oncology

## 2013-06-21 ENCOUNTER — Ambulatory Visit (HOSPITAL_BASED_OUTPATIENT_CLINIC_OR_DEPARTMENT_OTHER): Payer: BC Managed Care – PPO

## 2013-06-21 ENCOUNTER — Telehealth: Payer: Self-pay | Admitting: Hematology & Oncology

## 2013-06-21 VITALS — BP 133/69 | HR 98 | Temp 98.2°F | Resp 18 | Ht 68.0 in | Wt 199.0 lb

## 2013-06-21 DIAGNOSIS — M4810 Ankylosing hyperostosis [Forestier], site unspecified: Secondary | ICD-10-CM

## 2013-06-21 DIAGNOSIS — C9000 Multiple myeloma not having achieved remission: Secondary | ICD-10-CM

## 2013-06-21 DIAGNOSIS — Z86711 Personal history of pulmonary embolism: Secondary | ICD-10-CM

## 2013-06-21 DIAGNOSIS — Z86718 Personal history of other venous thrombosis and embolism: Secondary | ICD-10-CM

## 2013-06-21 DIAGNOSIS — I82409 Acute embolism and thrombosis of unspecified deep veins of unspecified lower extremity: Secondary | ICD-10-CM

## 2013-06-21 DIAGNOSIS — Z933 Colostomy status: Secondary | ICD-10-CM

## 2013-06-21 LAB — CMP (CANCER CENTER ONLY)
AST: 28 U/L (ref 11–38)
Albumin: 3.4 g/dL (ref 3.3–5.5)
Alkaline Phosphatase: 92 U/L — ABNORMAL HIGH (ref 26–84)
BUN, Bld: 10 mg/dL (ref 7–22)
CO2: 25 mEq/L (ref 18–33)
Calcium: 8.6 mg/dL (ref 8.0–10.3)
Chloride: 96 mEq/L — ABNORMAL LOW (ref 98–108)
Potassium: 3.9 mEq/L (ref 3.3–4.7)
Total Protein: 7.1 g/dL (ref 6.4–8.1)

## 2013-06-21 LAB — CBC WITH DIFFERENTIAL (CANCER CENTER ONLY)
BASO#: 0 10*3/uL (ref 0.0–0.2)
EOS%: 2.4 % (ref 0.0–7.0)
HCT: 37.6 % — ABNORMAL LOW (ref 38.7–49.9)
HGB: 12.7 g/dL — ABNORMAL LOW (ref 13.0–17.1)
LYMPH#: 0.9 10*3/uL (ref 0.9–3.3)
MCV: 101 fL — ABNORMAL HIGH (ref 82–98)
MONO#: 0.2 10*3/uL (ref 0.1–0.9)
NEUT#: 1.6 10*3/uL (ref 1.5–6.5)
NEUT%: 56.7 % (ref 40.0–80.0)
Platelets: 166 10*3/uL (ref 145–400)
RBC: 3.72 10*6/uL — ABNORMAL LOW (ref 4.20–5.70)
RDW: 13.5 % (ref 11.1–15.7)
WBC: 2.9 10*3/uL — ABNORMAL LOW (ref 4.0–10.0)

## 2013-06-21 MED ORDER — ZOLEDRONIC ACID 4 MG/100ML IV SOLN
4.0000 mg | Freq: Once | INTRAVENOUS | Status: AC
  Administered 2013-06-21: 4 mg via INTRAVENOUS
  Filled 2013-06-21: qty 100

## 2013-06-21 MED ORDER — SODIUM CHLORIDE 0.9 % IV SOLN
Freq: Once | INTRAVENOUS | Status: AC
  Administered 2013-06-21: 11:00:00 via INTRAVENOUS

## 2013-06-21 MED ORDER — VITAMIN B-6 250 MG PO TABS: 250.0000 mg | ORAL_TABLET | Freq: Every day | ORAL | Status: DC

## 2013-06-21 MED ORDER — DEXAMETHASONE SODIUM PHOSPHATE 20 MG/5ML IJ SOLN
INTRAMUSCULAR | Status: AC
  Filled 2013-06-21: qty 5

## 2013-06-21 MED ORDER — DEXAMETHASONE SODIUM PHOSPHATE 20 MG/5ML IJ SOLN
20.0000 mg | Freq: Once | INTRAMUSCULAR | Status: AC
  Administered 2013-06-21: 20 mg via INTRAVENOUS

## 2013-06-21 NOTE — Patient Instructions (Signed)

## 2013-06-21 NOTE — Progress Notes (Signed)
This office note has been dictated.

## 2013-06-21 NOTE — Addendum Note (Signed)
Addended by: Arlan Organ R on: 06/21/2013 11:48 AM   Modules accepted: Orders

## 2013-06-21 NOTE — Telephone Encounter (Signed)
Per MD ok to schedule 4 week appointment to 5 weeks 12-17

## 2013-06-25 ENCOUNTER — Encounter: Payer: Self-pay | Admitting: Family

## 2013-06-25 LAB — IGG, IGA, IGM
IgG (Immunoglobin G), Serum: 1550 mg/dL (ref 650–1600)
IgM, Serum: 17 mg/dL — ABNORMAL LOW (ref 41–251)

## 2013-06-25 LAB — PROTEIN ELECTROPHORESIS, SERUM
Alpha-2-Globulin: 13.1 % — ABNORMAL HIGH (ref 7.1–11.8)
Beta 2: 3.7 % (ref 3.2–6.5)
Beta Globulin: 5.4 % (ref 4.7–7.2)
Gamma Globulin: 18.1 % (ref 11.1–18.8)
M-Spike, %: 1 g/dL
Total Protein, Serum Electrophoresis: 7 g/dL (ref 6.0–8.3)

## 2013-06-25 LAB — KAPPA/LAMBDA LIGHT CHAINS
Kappa:Lambda Ratio: 55.43 — ABNORMAL HIGH (ref 0.26–1.65)
Lambda Free Lght Chn: 0.81 mg/dL (ref 0.57–2.63)

## 2013-06-26 ENCOUNTER — Other Ambulatory Visit: Payer: Self-pay | Admitting: Family

## 2013-06-26 ENCOUNTER — Telehealth: Payer: Self-pay | Admitting: Nurse Practitioner

## 2013-06-26 DIAGNOSIS — C9000 Multiple myeloma not having achieved remission: Secondary | ICD-10-CM

## 2013-06-26 MED ORDER — TRAMADOL HCL 50 MG PO TABS: 50.0000 mg | ORAL_TABLET | Freq: Two times a day (BID) | ORAL | Status: DC | PRN

## 2013-06-26 NOTE — Telephone Encounter (Signed)
Rx request phoned to pharmacy/SLS  

## 2013-06-26 NOTE — Telephone Encounter (Signed)
OK to send #60 tabs of tramadol, no refills please.

## 2013-06-26 NOTE — Telephone Encounter (Signed)
OK to send #60 with zero refills. 

## 2013-06-26 NOTE — Telephone Encounter (Addendum)
Message copied by Glee Arvin on Tue Jun 26, 2013 10:15 AM ------      Message from: Arlan Organ R      Created: Mon Jun 25, 2013  3:24 PM       Call - myeloma down by 50%!!! Pete ------Pt verbalized understanding and appreciation.

## 2013-06-26 NOTE — Telephone Encounter (Signed)
Refill request via My Chart for Tramadol Last filled by MD on - 10.20.14, #[id number] Last AEX - 10.17.14 Next AEX - 3 Months Please Advise/SLS

## 2013-06-30 NOTE — Progress Notes (Signed)
DIAGNOSES: 1. IgG kappa myeloma. 2. History of deep venous thrombosis/pulmonary embolism. 3. Perforated diverticula, status post laparotomy with colostomy.  CURRENT THERAPY: 1. Revlimid 15 mg p.o. daily (21/7). 2. Zometa 4 mg IV monthly. 3. Aspirin 162 mg p.o. daily.  INTERIM HISTORY:  Robert Berger comes in for his followup.  He is doing better with the 15 mg dose of Revlimid.  We last checked his monoclonal studies back in early September.  His monoclonal spike was 2.31 g/dL.  I will be interested to see what they are right now.  He is off Xarelto.  He did not want to take Xarelto any longer.  As such, we now have him on aspirin.  I told him to take 162 mg a day.  He stated that the Xarelto would make him bruise too much.  He really looks good.  He wants to go back to work, doing nonphysical labor.  I have no problems with him doing this.  He feels that having something for his mind will make him do better.  We are seeing about getting his colostomy reversed.  He is not sure if he wants to go through another surgery.  His back does not bother him nearly as much now.  I am not sure exactly what he did to get better from this.  He thinks that just being more active has helped him strengthen himself.  He has had no rashes.  He has had no fever.  His appetite has been pretty good.  He has had no cough or shortness of breath.  His colostomy is working well.  Overall, his performance status is ECOG 1.  PHYSICAL EXAMINATION:  General:  This is a well-developed, well- nourished, white gentleman, in no obvious distress.  Vital signs: Temperature of 98.2, pulse 98, respiratory rate 18, blood pressure 133/69.  Weight is 199 pounds.  Head and neck:  Show a normocephalic, atraumatic skull.  There are no ocular or oral lesions.  He has no palpable cervical or supraclavicular lymph nodes.  Lungs:  Clear bilaterally.  Cardiac:  Regular rate and rhythm with a normal S1 and S2. There are  no murmurs, rubs, or bruits.  Abdomen:  Soft.  He has good bowel sounds.  There is no palpable abdominal mass.  There is no palpable hepatosplenomegaly.  His colostomy is intact.  Extremities: Show no clubbing, cyanosis, or edema.  He has good strength in his legs. He has good range motion of his joints.  Neurological:  Shows no focal neurological deficits.  LABORATORY STUDIES:  White cell count is 2.9, hemoglobin is 12.7, hematocrit 37, platelet count 176.  IMPRESSION:  [name] with IgG kappa myeloma.  He essentially presented I think back in the summer of 2012. He has done very, very well.  He is much more functional now.  He is on Revlimid.  One would have to think that with his total protein coming down, that his myeloma spike should be coming down.  We will see what it is.  He will go ahead with his Zometa and Decadron.  He does well with these.  He has had no problems with infections, so we do not have to worry about doing any kind of IVIG on him.  We will go ahead and plan to get him back in another month for followup.  I am just glad to see that his performance status is improving nicely and that he is able to do what he  likes to do.    ______________________________ [name], M.D. PRE/MEDQ  D:  06/21/2013  T:  06/30/2013  Job:  [id number]

## 2013-07-01 ENCOUNTER — Other Ambulatory Visit: Payer: Self-pay | Admitting: Family

## 2013-07-02 NOTE — Telephone Encounter (Signed)
Denial sent to pharmacy as there should be refills on file from 02/26/13, #90 x 5 refills.

## 2013-07-06 ENCOUNTER — Encounter: Payer: Self-pay | Admitting: Family

## 2013-07-06 ENCOUNTER — Ambulatory Visit (INDEPENDENT_AMBULATORY_CARE_PROVIDER_SITE_OTHER): Payer: BC Managed Care – PPO | Admitting: Family

## 2013-07-06 VITALS — BP 120/80 | HR 89 | Temp 97.7°F | Resp 16 | Ht 68.0 in | Wt 203.0 lb

## 2013-07-06 DIAGNOSIS — M545 Low back pain, unspecified: Secondary | ICD-10-CM

## 2013-07-06 DIAGNOSIS — M549 Dorsalgia, unspecified: Secondary | ICD-10-CM

## 2013-07-06 MED ORDER — METHYLPREDNISOLONE 4 MG PO KIT: PACK | ORAL | Status: DC

## 2013-07-06 NOTE — Patient Instructions (Signed)
Start medrol dose pak.  Call if symptoms worsen, or if not improved in 1 week. You will be contacted re: scheduling your MRI. Follow up in 2 weeks.

## 2013-07-06 NOTE — Progress Notes (Signed)
Pre visit review using our clinic review tool, if applicable. No additional management support is needed unless otherwise documented below in the visit note. 

## 2013-07-06 NOTE — Assessment & Plan Note (Signed)
Deteriorated.  Will rx with medrol dose pak, obtain follow up MRI. Pt has rx at home for percocet and methocarbamol to be used prn.

## 2013-07-06 NOTE — Progress Notes (Signed)
Subjective:    Patient ID: Robert Berger, male    DOB: 06/05/1941, 72 y.o.   MRN: [id number]  HPI  [name] is a 72 yr old male who presents today with chief complaint of thigh pain.  He has hx of multiple myeloma as well as degenerative disc disease of the lumbar spine.  MRI 5/14 noted regression of the intraosseous lesions in the lumbar spine following treatment.  It also noted further collapse of T12.  He is s/p T11 and T12 vertebral augmentation.   He reports that pain started on Wednesday AM.  Woke him up.  Pain was radiating to the back of the right thigh. Took some pain pills which helped briefly. Began to hurt worse on Wednesday night.  Percocet and methocarbamol did not help.  He then developed right low back pain.  Reports that the right low back pain is same as the pain that he experienced at the time of his Multiple myeloma dx.  Leg pain 6/10 and back pain 3/10.  Reports pain is worsened by laying down.    Review of Systems See HPI  Past Medical History  Diagnosis Date  . Arthritis   . GERD (gastroesophageal reflux disease)   . Complication of anesthesia     aspiration during surgery, difficult to wake up  . Multiple myeloma   . Status post radiation therapy 03/23/12 - 04/05/12    T8 - L4/ 20 Gy/ 10 Fractions  . Dysphagia 05/06/12    ED Visit    History   Social History  . Marital Status: Significant Other    Spouse Name: N/A    Number of Children: 3  . Years of Education: 14   Occupational History  . Games developer, on medical leave    Social History Main Topics  . Smoking status: Former Smoker    Types: Cigarettes    Quit date: 03/09/1978  . Smokeless tobacco: Never Used  . Alcohol Use: Yes     Comment: 1 glass of wine or a burbon daily., none in the last 3 months  . Drug Use: No  . Sexual Activity: No   Other Topics Concern  . Not on file   Social History Narrative   Divorced x 2, lives with male partner (x 20 years). Normally able to  ambulate without assistance.   2 children- both daughters who live in [place]   Completed some college   Enjoys computers, camping trailer.      Past Surgical History  Procedure Laterality Date  . Hemorrhoid surgery    . Laparotomy  05/14/2012    Procedure: EXPLORATORY LAPAROTOMY;  Surgeon: Velora Heckler, MD;  Location: [facility];  Service: General;  Laterality: N/A;  . Colostomy  05/14/2012    Procedure: COLOSTOMY;  Surgeon: Velora Heckler, MD;  Location: [facility];  Service: General;  Laterality: N/A;  . Back surgery      47 years ago  . Fracture surgery      ankle    Family History  Problem Relation Age of Onset  . Cancer Maternal Grandfather   . Cancer Maternal Grandmother     Allergies  Allergen Reactions  . Penicillins Nausea And Vomiting, Swelling and Rash    Current Outpatient Prescriptions on File Prior to Visit  Medication Sig Dispense Refill  . aspirin 81 MG tablet Take 162 mg by mouth daily.       . baclofen (LIORESAL) 10 MG tablet Take 1 tablet (10  mg total) by mouth 3 (three) times daily.  90 each  5  . diltiazem (CARDIZEM CD) 120 MG 24 hr capsule Take 1 capsule (120 mg total) by mouth every morning.  30 capsule  5  . lansoprazole (PREVACID) 30 MG capsule Take 1 capsule (30 mg total) by mouth every morning.  30 capsule  5  . lenalidomide (REVLIMID) 15 MG capsule Take 15 mg by mouth every other day.      . lisinopril (PRINIVIL,ZESTRIL) 2.5 MG tablet Take 2.5 mg by mouth daily.       . methocarbamol (ROBAXIN) 500 MG tablet Take 500-1,000 mg by mouth 3 (three) times daily as needed (spasm/ pain). For pain/spasms      . ondansetron (ZOFRAN-ODT) 4 MG disintegrating tablet Take 4 mg by mouth every 12 (twelve) hours as needed for nausea.       Marland Kitchen oxyCODONE-acetaminophen (PERCOCET) 5-325 MG per tablet Take 1 tablet by mouth every 4 (four) hours as needed for pain.  90 tablet  0  . polyethylene glycol (MIRALAX / GLYCOLAX) packet Take 17 g by mouth daily as needed (constipation).        . Pyridoxine HCl (VITAMIN B-6) 250 MG tablet Take 1 tablet (250 mg total) by mouth daily.  30 tablet  12  . traMADol (ULTRAM) 50 MG tablet Take 1 tablet (50 mg total) by mouth 2 (two) times daily as needed.  60 tablet  0   No current facility-administered medications on file prior to visit.    BP 120/80  Pulse 89  Temp(Src) 97.7 F (36.5 C) (Oral)  Resp 16  Ht 5\' 8"  (1.727 m)  Wt 203 lb (92.08 kg)  BMI 30.87 kg/m2  SpO2 98%       Objective:   Physical Exam  Constitutional: He appears well-developed and well-nourished.  Cardiovascular: Normal rate and regular rhythm.   No murmur heard. Pulmonary/Chest: Effort normal and breath sounds normal. No respiratory distress. He has no wheezes. He has no rales. He exhibits no tenderness.  Musculoskeletal: He exhibits no edema.  Neurological:  Reflex Scores:      Patellar reflexes are 3+ on the right side and 3+ on the left side.         Assessment & Plan:

## 2013-07-07 ENCOUNTER — Ambulatory Visit
Admission: RE | Admit: 2013-07-07 | Discharge: 2013-07-07 | Disposition: A | Discharge status: Home / Self Care | Payer: BC Managed Care – PPO | Source: Ambulatory Visit | Attending: Family | Admitting: Family

## 2013-07-07 DIAGNOSIS — M545 Low back pain: Secondary | ICD-10-CM

## 2013-07-09 ENCOUNTER — Other Ambulatory Visit: Payer: Self-pay | Admitting: Hematology & Oncology

## 2013-07-09 ENCOUNTER — Telehealth: Payer: Self-pay | Admitting: *Deleted

## 2013-07-09 DIAGNOSIS — C9000 Multiple myeloma not having achieved remission: Secondary | ICD-10-CM

## 2013-07-09 MED ORDER — DEXAMETHASONE 4 MG PO TABS: ORAL_TABLET | ORAL | Status: DC

## 2013-07-09 NOTE — Telephone Encounter (Signed)
Received call from radiology department wanting to let us know that pt's MRI from 07/07/13 is available in [facility].  Please advise.

## 2013-07-09 NOTE — Telephone Encounter (Signed)
Reviewed MRI results with Dr. Myna Hidalgo.  Dr. Myna Hidalgo will discuss with radiation oncologist re: possible radiation treatment and will contact pt.  He advised against referral to neurosurgery at this time.   Also reviewed results with pt.  Pt notes some improvement in symptoms with medrol dose pak.  Advised pt to call Dr. Gustavo Lah office if he has not heard back re: appointment with radiatio oncologist by the end of the week. He verbalizes understanding.

## 2013-07-11 NOTE — Progress Notes (Signed)
Histology and Location of Primary Cancer: Multiple Myeloma  Sites of Visceral and Bony Metastatic Disease: T12 compression fracture status post augmentation, L3 vertebral body tumor, L5, expansile tumor at lower S1/S2 level  Location(s) of Symptomatic Metastases: central sacral tumor - patient had severe back pain that radiated to his left posterior leg.  Past/Anticipated chemotherapy by medical oncology, if any: Revlimid 15 mg p.o. daily (21/7).  Zometa 4 mg IV monthly.  Pain on a scale of 0-10 is: 5 in left leg (back of thigh and knee).   If Spine Met(s), symptoms, if any, include:  Bowel/Bladder retention or incontinence (please describe): Has fungal infection on glans of penis.  Using clotrimazole  Numbness or weakness in extremities (please describe): tingling numbness in legs and feet ever since back surgery in 2013.  Current Decadron regimen, if applicable: takes 8 mg TID  Ambulatory status? Walker? Wheelchair?: uses a cane  SAFETY ISSUES:  Prior radiation?  03/23/12 - 04/05/12  20 Gy to T8-L4 spine  Pacemaker/ICD? no  Possible current pregnancy? no  Is the patient on methotrexate? no  Current Complaints / other details:  Uses percocet 2-3 times a day.  Reports decadron does not help as much now.

## 2013-07-12 ENCOUNTER — Ambulatory Visit
Admission: RE | Admit: 2013-07-12 | Discharge: 2013-07-12 | Disposition: A | Discharge status: Home / Self Care | Payer: BC Managed Care – PPO | Source: Ambulatory Visit | Attending: Radiation Oncology | Admitting: Radiation Oncology

## 2013-07-12 ENCOUNTER — Encounter: Payer: Self-pay | Admitting: Radiation Oncology

## 2013-07-12 VITALS — BP 129/70 | HR 93 | Temp 97.5°F | Ht 68.0 in | Wt 200.1 lb

## 2013-07-12 DIAGNOSIS — M5124 Other intervertebral disc displacement, thoracic region: Secondary | ICD-10-CM | POA: Insufficient documentation

## 2013-07-12 DIAGNOSIS — C9000 Multiple myeloma not having achieved remission: Secondary | ICD-10-CM

## 2013-07-12 DIAGNOSIS — Z880 Allergy status to penicillin: Secondary | ICD-10-CM | POA: Insufficient documentation

## 2013-07-12 DIAGNOSIS — M79609 Pain in unspecified limb: Secondary | ICD-10-CM | POA: Insufficient documentation

## 2013-07-12 DIAGNOSIS — M8448XA Pathological fracture, other site, initial encounter for fracture: Secondary | ICD-10-CM | POA: Insufficient documentation

## 2013-07-12 DIAGNOSIS — M25569 Pain in unspecified knee: Secondary | ICD-10-CM | POA: Insufficient documentation

## 2013-07-12 NOTE — Progress Notes (Signed)
Please see the Nurse Progress Note in the MD Initial Consult Encounter for this patient. 

## 2013-07-12 NOTE — Progress Notes (Signed)
Radiation Oncology         [phone] ________________________________  Name: TEREN ZURCHER MRN: [id number]  Date: 07/12/2013  DOB: August 01, 1941  Reevaluation note  CC: Lemont Fillers., NP  [name], MD  Diagnosis:   Multiple myeloma  Interval Since Last Radiation:  One year and 3 months, the patient completed radiation treatments directed at the T8-L4 spine under the direction of Dr. Basilio Cairo. Patient received 20 gray in 10 fractions  Narrative:  The patient returns today for reevaluation and consideration for additional radiation treatment. Recently the patient developed pain in the left thigh area as well as the left knee and some radiation into his left lower leg. An MRI which is documented below showed progressive disease in the lower lumbar spine and upper sacrum area.  The nerve roots exiting S1 and S2  on the left side were encased by tumor.  Patient was seen by Dr. Myna Hidalgo and steroids were ordered. This has helped the patient's pain somewhat but he continues to have symptoms. Patient denies any bowel or bladder incontinence or urinary retention. He denies any focal weakness in his lower extremities.                             ALLERGIES:  is allergic to penicillins.  Meds: Current Outpatient Prescriptions  Medication Sig Dispense Refill  . aspirin 81 MG tablet Take 162 mg by mouth daily.       . baclofen (LIORESAL) 10 MG tablet Take 1 tablet (10 mg total) by mouth 3 (three) times daily.  90 each  5  . dexamethasone (DECADRON) 4 MG tablet Take 2 pills with food 3 times a day.  90 tablet  4  . diltiazem (CARDIZEM CD) 120 MG 24 hr capsule Take 1 capsule (120 mg total) by mouth every morning.  30 capsule  5  . lansoprazole (PREVACID) 30 MG capsule Take 1 capsule (30 mg total) by mouth every morning.  30 capsule  5  . lenalidomide (REVLIMID) 15 MG capsule Take 15 mg by mouth every other day.      . lisinopril (PRINIVIL,ZESTRIL) 2.5 MG tablet Take 2.5 mg by mouth  daily.       . methocarbamol (ROBAXIN) 500 MG tablet Take 500-1,000 mg by mouth 3 (three) times daily as needed (spasm/ pain). For pain/spasms      . ondansetron (ZOFRAN-ODT) 4 MG disintegrating tablet Take 4 mg by mouth every 12 (twelve) hours as needed for nausea.       Marland Kitchen oxyCODONE-acetaminophen (PERCOCET) 5-325 MG per tablet Take 1 tablet by mouth every 4 (four) hours as needed for pain.  90 tablet  0  . polyethylene glycol (MIRALAX / GLYCOLAX) packet Take 17 g by mouth daily as needed (constipation).       . Pyridoxine HCl (VITAMIN B-6) 250 MG tablet Take 1 tablet (250 mg total) by mouth daily.  30 tablet  12  . traMADol (ULTRAM) 50 MG tablet Take 1 tablet (50 mg total) by mouth 2 (two) times daily as needed.  60 tablet  0   No current facility-administered medications for this encounter.    Physical Findings: The patient is in no acute distress. Patient is alert and oriented.  height is 5\' 8"  (1.727 m) and weight is 200 lb 1.6 oz (90.765 kg). His temperature is 97.5 F (36.4 C). His blood pressure is 129/70 and his pulse is 93. His oxygen saturation is 98%. Marland Kitchen  The lungs are clear. The heart has regular rhythm and rate. The abdomen is soft and nontender with normal bowel sounds. Patient has colostomy bag in place. On neurological examination the patient has good strength in the proximal and distal muscle groups of the lower extremities.  Palpation along the lower lumbar spine and sacrum area reveals moderate tenderness.  Lab Findings: Lab Results  Component Value Date   WBC 2.9* 06/21/2013   HGB 12.7* 06/21/2013   HCT 37.6* 06/21/2013   MCV 101* 06/21/2013   PLT 166 06/21/2013      Radiographic Findings: Mr Lumbar Spine Wo Contrast  07/07/2013   CLINICAL DATA:  72 year old male with multiple myeloma. Severe right side low back pain. Left side pain radiating to the lower extremity. Initial encounter.  EXAM: MRI LUMBAR SPINE WITHOUT CONTRAST  TECHNIQUE: Multiplanar, multisequence MR  imaging was performed. No intravenous contrast was administered.  COMPARISON:  CT Abdomen and Pelvis 03/19/2013. Lumbar MRI 12/20/2012.  FINDINGS: Same numbering system as on the comparisons.  Sequelae of T12 pathologic compression fracture status post augmentation and adjacent T11 vertebral body augmentation appear stable since 12/20/2012. Partially visible T10 level, also was seen to be augmented in August. Visualized lower thoracic spinal cord is normal with conus medularis at L1. There is a chronic right paracentral disc protrusion at T12-L1, stable since May.  Abnormal bone marrow signal now throughout the lumbar spine, with heterogeneous loss of the normal fatty marrow signal which predominated in May.  The L1 and L2 levels remain intact and demonstrate no epidural or foraminal tumor. Chronic L2-L3 central disc protrusion has not significantly changed. No significant spinal stenosis.  The L3 level has been augmented, and appears stable since August. The posterior L3 vertebral body tumor appears mildly progressed in overall size (now up to 19 mm diameter, 15 mm in May. No epidural or extraosseous tumor at this level. Central disc protrusion at L3 appears stable (series 5, image 24). Mild left lateral recess stenosis as before.  The L4 vertebra remains intact. Central L4 disc protrusion superimposed on facet hypertrophy with bilateral lateral recess stenosis is stable. No significant spinal stenosis. No epidural or extraosseous tumor at this level.  The L5 level is now almost completely replaced by tumor. Still, no epidural or foraminal disease is identified. Stable epidural lipomatosis. Stable superimposed L5-S1 facet degeneration.  New expansile tumor centered at the lower S1/S1 S2 level, has eroded through the posterior vertebral body and extends in the epidural space to the left of midline. The exiting left S1 nerve is engulfed by tumor (series 7, image 39). The descending left S2 nerve roots are engulfed  (image 38). No sacral pathologic fracture identified.  There is now diffusely abnormal marrow in the visible medial iliac bones.  IMPRESSION: 1. Progressed multiple myeloma in the L5 vertebra and sacrum. Bulky central sacral tumor which has eroded the posterior vertebral body cortex and tracks in the sacral epidural space engulfing the exiting left S1 and descending S2 nerve roots. 2. Progressed myelomatous involvement of the remaining lumbar levels, but with a miliary pattern of osseous involvement and no extraosseous tumor. Stable L3 vertebral body augmentation since August. 3. Stable pathologic fractures and augmentation in the visible lower thoracic spine. 4. Superimposed lumbar spine degeneration with multilevel disc herniations, not significantly changed.  These results will be called to the ordering clinician or representative by the Radiologist Assistant, and communication documented in the PACS Dashboard.   Electronically Signed   By: Si Gaul.D.  On: 07/07/2013 17:32    Impression:  Progressive multiple myeloma. The patient has disease progression in the lower lumbar spine and upper sacrum area. He would be a good candidate for a short course of palliative radiation therapy directed to this area. Per report the patient's previous radiation extended through L4 and we may have to make some compromises in coverage of this area to avoid significant overlap with previous treatment although previous dose was modest at 20 gray.  Plan:  Simulation and planning on December 8 with treatments to begin doon afterward. Anticipate between 2 and 3 weeks of radiation therapy as part of the patient's management.  _____________________________________  -----------------------------------  Billie Lade, PhD, MD

## 2013-07-13 ENCOUNTER — Encounter: Payer: Self-pay | Admitting: Family

## 2013-07-13 ENCOUNTER — Other Ambulatory Visit: Payer: Self-pay | Admitting: *Deleted

## 2013-07-13 MED ORDER — LENALIDOMIDE 15 MG PO CAPS: ORAL_CAPSULE | ORAL | Status: DC

## 2013-07-13 NOTE — Telephone Encounter (Signed)
Judeth Cornfield- please cancel his follow up and let him know. thanks

## 2013-07-16 ENCOUNTER — Ambulatory Visit
Admission: RE | Admit: 2013-07-16 | Discharge: 2013-07-16 | Disposition: A | Discharge status: Home / Self Care | Payer: BC Managed Care – PPO | Source: Ambulatory Visit | Attending: Radiation Oncology | Admitting: Radiation Oncology

## 2013-07-16 DIAGNOSIS — C9000 Multiple myeloma not having achieved remission: Secondary | ICD-10-CM | POA: Insufficient documentation

## 2013-07-16 DIAGNOSIS — M545 Low back pain, unspecified: Secondary | ICD-10-CM | POA: Insufficient documentation

## 2013-07-16 DIAGNOSIS — Z510 Encounter for antineoplastic radiation therapy: Secondary | ICD-10-CM | POA: Insufficient documentation

## 2013-07-16 DIAGNOSIS — IMO0002 Reserved for concepts with insufficient information to code with codable children: Secondary | ICD-10-CM | POA: Insufficient documentation

## 2013-07-16 DIAGNOSIS — M79609 Pain in unspecified limb: Secondary | ICD-10-CM | POA: Insufficient documentation

## 2013-07-16 DIAGNOSIS — R5381 Other malaise: Secondary | ICD-10-CM | POA: Insufficient documentation

## 2013-07-17 ENCOUNTER — Encounter: Payer: Self-pay | Admitting: Radiation Oncology

## 2013-07-17 NOTE — Progress Notes (Signed)
  Radiation Oncology         [phone] ________________________________  Name: Robert Berger MRN: [id number]  Date: 07/16/2013  DOB: 10-06-1940  SIMULATION AND TREATMENT PLANNING NOTE  DIAGNOSIS: Multiple myeloma  NARRATIVE:  The patient was brought to the CT Simulation planning suite.  Identity was confirmed.  All relevant records and images related to the planned course of therapy were reviewed.  The patient freely provided informed written consent to proceed with treatment after reviewing the details related to the planned course of therapy. The consent form was witnessed and verified by the simulation staff.  Then, the patient was set-up in a stable reproducible  supine position for radiation therapy.  CT images were obtained.  Surface markings were placed.  The CT images were loaded into the planning software.  Then the target and avoidance structures were contoured.  Treatment planning then occurred.  The radiation prescription was entered and confirmed.  Then, I designed and supervised the construction of a total of 5 medically necessary complex treatment devices.  I have requested : Isodose Plan.  I have ordered:dose calc.  PLAN:  The patient will receive 25 Gy in 10 fractions.  ________________________________  -----------------------------------  Billie Lade, PhD, MD

## 2013-07-18 ENCOUNTER — Encounter: Payer: Self-pay | Admitting: Oncology

## 2013-07-18 ENCOUNTER — Other Ambulatory Visit: Payer: Self-pay | Admitting: Nurse Practitioner

## 2013-07-18 NOTE — Telephone Encounter (Signed)
Pt called and stated he was "vibrating" from the decadron. Per Dr. Myna Hidalgo he was instructed to decrease the decadron to twice a day vs. Three times per day. Pt verbalized understanding.

## 2013-07-19 ENCOUNTER — Telehealth: Payer: Self-pay | Admitting: *Deleted

## 2013-07-20 ENCOUNTER — Ambulatory Visit: Payer: BC Managed Care – PPO | Admitting: Family

## 2013-07-23 ENCOUNTER — Ambulatory Visit
Admission: RE | Admit: 2013-07-23 | Discharge: 2013-07-23 | Disposition: A | Discharge status: Home / Self Care | Payer: BC Managed Care – PPO | Source: Ambulatory Visit | Attending: Radiation Oncology | Admitting: Radiation Oncology

## 2013-07-23 DIAGNOSIS — C9000 Multiple myeloma not having achieved remission: Secondary | ICD-10-CM

## 2013-07-23 NOTE — Progress Notes (Signed)
  Radiation Oncology         [phone] ________________________________  Name: Robert Berger MRN: [id number]  Date: 07/23/2013  DOB: 05/11/1941  Simulation Verification Note  Status: outpatient  NARRATIVE: The patient was brought to the treatment unit and placed in the planned treatment position. The clinical setup was verified. Then port films were obtained and uploaded to the radiation oncology medical record software.  The treatment beams were carefully compared against the planned radiation fields. The position location and shape of the radiation fields was reviewed. They targeted volume of tissue appears to be appropriately covered by the radiation beams. Organs at risk appear to be excluded as planned.  Based on my personal review, I approved the simulation verification. The patient's treatment will proceed as planned.  -----------------------------------  Billie Lade, PhD, MD

## 2013-07-24 ENCOUNTER — Ambulatory Visit
Admission: RE | Admit: 2013-07-24 | Discharge: 2013-07-24 | Disposition: A | Discharge status: Home / Self Care | Payer: BC Managed Care – PPO | Source: Ambulatory Visit | Attending: Radiation Oncology | Admitting: Radiation Oncology

## 2013-07-24 ENCOUNTER — Encounter: Payer: Self-pay | Admitting: Radiation Oncology

## 2013-07-24 VITALS — BP 130/84 | HR 96 | Temp 98.6°F | Resp 20 | Wt 202.1 lb

## 2013-07-24 DIAGNOSIS — C9000 Multiple myeloma not having achieved remission: Secondary | ICD-10-CM

## 2013-07-24 NOTE — Progress Notes (Signed)
[facility]    Radiation Oncology [address] [place]     Maryln Gottron, M.D. [place], [place] [address]               Billie Lade, M.D., Ph.D. Phone: [phone]      Molli Hazard A. Kathrynn Running, M.D. Fax: [phone]      Radene Gunning, M.D., Ph.D.         Lurline Hare, M.D.         Grayland Jack, M.D Weekly Treatment Management Note  Name: Robert Berger     MRN: [id number]        CSN: [id number] Date: 07/24/2013      DOB: 1941-05-14  CC: Lemont Fillers., NP         Peggyann Juba    Status: Outpatient  Diagnosis: The encounter diagnosis was Multiple myeloma.  Current Dose: 2.5 Gy  Current Fraction: 1  Planned Dose: 25 GY  Narrative: [place] Records was seen today for weekly treatment management. The chart was checked and port films  were reviewed. He is tolerating the treatments well thus far. He is having trouble sleeping at night and I recommended he take his Decadron earlier in the evening or late afternoon. Does have generalized weakness in his lower extremities but is able to ambulate with the assistance of a walker.  Penicillins Current Outpatient Prescriptions  Medication Sig Dispense Refill  . aspirin 81 MG tablet Take 162 mg by mouth daily.       . baclofen (LIORESAL) 10 MG tablet Take 1 tablet (10 mg total) by mouth 3 (three) times daily.  90 each  5  . dexamethasone (DECADRON) 4 MG tablet Take 2 pills with food 3 times a day.  90 tablet  4  . diltiazem (CARDIZEM CD) 120 MG 24 hr capsule Take 1 capsule (120 mg total) by mouth every morning.  30 capsule  5  . lansoprazole (PREVACID) 30 MG capsule Take 1 capsule (30 mg total) by mouth every morning.  30 capsule  5  . lenalidomide (REVLIMID) 15 MG capsule Take 1 cap daily for 21 days then 7 days off.  Authorization # is [id number]  21 capsule  0  . lisinopril (PRINIVIL,ZESTRIL) 2.5 MG tablet Take 2.5 mg by mouth daily.       . methocarbamol (ROBAXIN) 500 MG tablet Take 500-1,000 mg by mouth 3  (three) times daily as needed (spasm/ pain). For pain/spasms      . ondansetron (ZOFRAN-ODT) 4 MG disintegrating tablet Take 4 mg by mouth every 12 (twelve) hours as needed for nausea.       Marland Kitchen oxyCODONE-acetaminophen (PERCOCET) 5-325 MG per tablet Take 1 tablet by mouth every 4 (four) hours as needed for pain.  90 tablet  0  . polyethylene glycol (MIRALAX / GLYCOLAX) packet Take 17 g by mouth daily as needed (constipation).       . Pyridoxine HCl (VITAMIN B-6) 250 MG tablet Take 1 tablet (250 mg total) by mouth daily.  30 tablet  12  . traMADol (ULTRAM) 50 MG tablet Take 1 tablet (50 mg total) by mouth 2 (two) times daily as needed.  60 tablet  0   No current facility-administered medications for this encounter.   Labs:  Lab Results  Component Value Date   WBC 2.9* 06/21/2013   HGB 12.7* 06/21/2013   HCT 37.6* 06/21/2013   MCV 101* 06/21/2013   PLT 166 06/21/2013   Lab Results  Component Value Date  CREATININE 0.9 06/21/2013   BUN 10 06/21/2013   NA 138 06/21/2013   K 3.9 06/21/2013   CL 96* 06/21/2013   CO2 25 06/21/2013   Lab Results  Component Value Date   ALT 25 06/21/2013   AST 28 06/21/2013   PHOS 2.3 05/22/2012   BILITOT 0.90 06/21/2013    Physical Examination:  Filed Vitals:   07/24/13 1822  BP: 130/84  Pulse: 96  Temp: 98.6 F (37 C)  Resp: 20    Wt Readings from Last 3 Encounters:  07/24/13 202 lb 1.6 oz (91.672 kg)  07/12/13 200 lb 1.6 oz (90.765 kg)  07/06/13 203 lb (92.08 kg)     Lungs - Normal respiratory effort, chest expands symmetrically. Lungs are clear to auscultation, no crackles or wheezes.  Heart has regular rhythm and rate  Abdomen is soft and non tender with normal bowel sounds The patient has some mild weakness in his proximal muscle groups of the left lower extremity.  Assessment:  Patient tolerating treatments well  Plan: Continue treatment per original radiation prescription

## 2013-07-24 NOTE — Progress Notes (Signed)
Pt states lower back/sacrum pain is 4, took a Percocet before coming to radiation today. He states his pain was 5-6 before taking Percocet. Pt takes tramadol BID regularly, Percocet prn. Pt states Decadron decreased per Dr Myna Hidalgo, taking 1 tab BID.  Pt states he has questions re: MRI, will discuss w/Dr Roselind Messier today.

## 2013-07-25 ENCOUNTER — Encounter: Payer: Self-pay | Admitting: Family

## 2013-07-25 ENCOUNTER — Ambulatory Visit (HOSPITAL_BASED_OUTPATIENT_CLINIC_OR_DEPARTMENT_OTHER): Payer: Medicare Other | Admitting: Hematology & Oncology

## 2013-07-25 ENCOUNTER — Ambulatory Visit (HOSPITAL_BASED_OUTPATIENT_CLINIC_OR_DEPARTMENT_OTHER): Payer: BC Managed Care – PPO

## 2013-07-25 ENCOUNTER — Ambulatory Visit: Payer: BC Managed Care – PPO

## 2013-07-25 ENCOUNTER — Ambulatory Visit (HOSPITAL_BASED_OUTPATIENT_CLINIC_OR_DEPARTMENT_OTHER): Payer: BC Managed Care – PPO | Admitting: Lab

## 2013-07-25 VITALS — HR 98 | Temp 98.0°F | Resp 18 | Ht 68.0 in | Wt 203.0 lb

## 2013-07-25 DIAGNOSIS — C9000 Multiple myeloma not having achieved remission: Secondary | ICD-10-CM

## 2013-07-25 DIAGNOSIS — Z86718 Personal history of other venous thrombosis and embolism: Secondary | ICD-10-CM

## 2013-07-25 DIAGNOSIS — Z86711 Personal history of pulmonary embolism: Secondary | ICD-10-CM

## 2013-07-25 DIAGNOSIS — M545 Low back pain: Secondary | ICD-10-CM

## 2013-07-25 DIAGNOSIS — M4810 Ankylosing hyperostosis [Forestier], site unspecified: Secondary | ICD-10-CM

## 2013-07-25 DIAGNOSIS — M79602 Pain in left arm: Secondary | ICD-10-CM

## 2013-07-25 LAB — CBC WITH DIFFERENTIAL (CANCER CENTER ONLY)
Eosinophils Absolute: 0.2 10*3/uL (ref 0.0–0.5)
HCT: 38.2 % — ABNORMAL LOW (ref 38.7–49.9)
LYMPH%: 3.5 % — ABNORMAL LOW (ref 14.0–48.0)
MCV: 98 fL (ref 82–98)
MONO#: 0.5 10*3/uL (ref 0.1–0.9)
NEUT#: 8 10*3/uL — ABNORMAL HIGH (ref 1.5–6.5)
NEUT%: 89.1 % — ABNORMAL HIGH (ref 40.0–80.0)
Platelets: 64 10*3/uL — ABNORMAL LOW (ref 145–400)
RBC: 3.9 10*6/uL — ABNORMAL LOW (ref 4.20–5.70)
WBC: 8.9 10*3/uL (ref 4.0–10.0)

## 2013-07-25 LAB — CMP (CANCER CENTER ONLY)
AST: 59 U/L — ABNORMAL HIGH (ref 11–38)
Albumin: 2.7 g/dL — ABNORMAL LOW (ref 3.3–5.5)
Alkaline Phosphatase: 63 U/L (ref 26–84)
Chloride: 103 mEq/L (ref 98–108)
Glucose, Bld: 151 mg/dL — ABNORMAL HIGH (ref 73–118)
Potassium: 4.3 mEq/L (ref 3.3–4.7)
Sodium: 136 mEq/L (ref 128–145)
Total Protein: 5.4 g/dL — ABNORMAL LOW (ref 6.4–8.1)

## 2013-07-25 MED ORDER — ZOLEDRONIC ACID 4 MG/100ML IV SOLN
4.0000 mg | Freq: Once | INTRAVENOUS | Status: AC
  Administered 2013-07-25: 4 mg via INTRAVENOUS
  Filled 2013-07-25: qty 100

## 2013-07-25 MED ORDER — SODIUM CHLORIDE 0.9 % IV SOLN
Freq: Once | INTRAVENOUS | Status: AC
  Administered 2013-07-25: 12:00:00 via INTRAVENOUS

## 2013-07-25 MED ORDER — HEPARIN SOD (PORK) LOCK FLUSH 100 UNIT/ML IV SOLN
250.0000 [IU] | Freq: Once | INTRAVENOUS | Status: DC | PRN
  Filled 2013-07-25: qty 5

## 2013-07-25 MED ORDER — SODIUM CHLORIDE 0.9 % IJ SOLN
3.0000 mL | Freq: Once | INTRAMUSCULAR | Status: DC | PRN
  Filled 2013-07-25: qty 10

## 2013-07-25 MED ORDER — HEPARIN SOD (PORK) LOCK FLUSH 100 UNIT/ML IV SOLN
500.0000 [IU] | Freq: Once | INTRAVENOUS | Status: DC | PRN
  Filled 2013-07-25: qty 5

## 2013-07-25 MED ORDER — SODIUM CHLORIDE 0.9 % IJ SOLN
10.0000 mL | INTRAMUSCULAR | Status: DC | PRN
  Filled 2013-07-25: qty 10

## 2013-07-25 MED ORDER — PREDNISONE 20 MG PO TABS: ORAL_TABLET | ORAL | Status: DC

## 2013-07-25 MED ORDER — ALTEPLASE 2 MG IJ SOLR
2.0000 mg | Freq: Once | INTRAMUSCULAR | Status: DC | PRN
  Filled 2013-07-25: qty 2

## 2013-07-25 NOTE — Telephone Encounter (Signed)
Please contact pt to arrange follow up. See mychart message.

## 2013-07-25 NOTE — Progress Notes (Signed)
This office note has been dictated.

## 2013-07-25 NOTE — Patient Instructions (Signed)

## 2013-07-25 NOTE — Telephone Encounter (Signed)
Do we need to schedule pt for a physical or just do a follow up?

## 2013-07-26 ENCOUNTER — Other Ambulatory Visit: Payer: Self-pay | Admitting: *Deleted

## 2013-07-26 ENCOUNTER — Other Ambulatory Visit: Payer: Self-pay | Admitting: Radiology

## 2013-07-26 ENCOUNTER — Ambulatory Visit
Admission: RE | Admit: 2013-07-26 | Discharge: 2013-07-26 | Disposition: A | Discharge status: Home / Self Care | Payer: BC Managed Care – PPO | Source: Ambulatory Visit | Attending: Radiation Oncology | Admitting: Radiation Oncology

## 2013-07-26 ENCOUNTER — Ambulatory Visit (HOSPITAL_BASED_OUTPATIENT_CLINIC_OR_DEPARTMENT_OTHER)
Admission: RE | Admit: 2013-07-26 | Discharge: 2013-07-26 | Disposition: A | Discharge status: Home / Self Care | Payer: BC Managed Care – PPO | Source: Ambulatory Visit | Attending: Hematology & Oncology | Admitting: Hematology & Oncology

## 2013-07-26 ENCOUNTER — Telehealth: Payer: Self-pay | Admitting: Hematology & Oncology

## 2013-07-26 DIAGNOSIS — C9000 Multiple myeloma not having achieved remission: Secondary | ICD-10-CM

## 2013-07-26 DIAGNOSIS — M79609 Pain in unspecified limb: Secondary | ICD-10-CM | POA: Insufficient documentation

## 2013-07-26 DIAGNOSIS — M79602 Pain in left arm: Secondary | ICD-10-CM

## 2013-07-26 MED ORDER — RADIAPLEXRX EX GEL
Freq: Once | CUTANEOUS | Status: AC
  Administered 2013-07-26: 18:00:00 via TOPICAL

## 2013-07-26 MED ORDER — VANCOMYCIN HCL 10 G IV SOLR: 1000.0000 mg | INTRAVENOUS | Status: DC

## 2013-07-26 MED ORDER — HYDROMORPHONE HCL 2 MG PO TABS: 2.0000 mg | ORAL_TABLET | ORAL | Status: DC | PRN

## 2013-07-26 NOTE — Telephone Encounter (Signed)
Per Amy RN GI at [facility] scheduled 07-27-13 MRI at 830 am. Pt aware he called back wanting to speak to RN

## 2013-07-26 NOTE — Progress Notes (Signed)
Robert Berger here with his wife for post sim education.  Gave him the Radiation Therapy and You book and discussed potential side effects including pain, skin changes, nausea and fatigue.  He was given radiaplex gel and was instructed to apply it twice a day with the first dose at least 4 hours before treatment.  Educated him on put day with Dr. Roselind Messier on Tuesday's.  Advised him to call with any questions or concerns.

## 2013-07-26 NOTE — Telephone Encounter (Signed)
Pt called with c/o left arm pain from the wrist to elbow. The Percocet is barely taking the edge off. Denies having swelling, redness, or warmth. Reviewed with Dr Myna Hidalgo. He thinks it may be coming from his neck. To move MRI up, have a left arm x-ray, and change Percocet to Dilaudid.

## 2013-07-27 ENCOUNTER — Ambulatory Visit
Admission: RE | Admit: 2013-07-27 | Discharge: 2013-07-27 | Disposition: A | Discharge status: Home / Self Care | Payer: BC Managed Care – PPO | Source: Ambulatory Visit | Attending: Radiation Oncology | Admitting: Radiation Oncology

## 2013-07-27 ENCOUNTER — Other Ambulatory Visit (HOSPITAL_COMMUNITY): Payer: Self-pay | Admitting: Radiology

## 2013-07-27 ENCOUNTER — Encounter (HOSPITAL_COMMUNITY): Payer: Self-pay | Admitting: Pharmacy Technician

## 2013-07-27 ENCOUNTER — Ambulatory Visit
Admission: RE | Admit: 2013-07-27 | Discharge: 2013-07-27 | Disposition: A | Discharge status: Home / Self Care | Payer: BC Managed Care – PPO | Source: Ambulatory Visit | Attending: Hematology & Oncology | Admitting: Hematology & Oncology

## 2013-07-27 DIAGNOSIS — C9000 Multiple myeloma not having achieved remission: Secondary | ICD-10-CM

## 2013-07-27 LAB — PROTEIN ELECTROPHORESIS, SERUM, WITH REFLEX
Albumin ELP: 58 % (ref 55.8–66.1)
Beta 2: 1.9 % — ABNORMAL LOW (ref 3.2–6.5)
Gamma Globulin: 10.1 % — ABNORMAL LOW (ref 11.1–18.8)
M-Spike, %: 0.39 g/dL
Total Protein, Serum Electrophoresis: 5.1 g/dL — ABNORMAL LOW (ref 6.0–8.3)

## 2013-07-27 LAB — KAPPA/LAMBDA LIGHT CHAINS
Kappa free light chain: 2.2 mg/dL — ABNORMAL HIGH (ref 0.33–1.94)
Lambda Free Lght Chn: 0.8 mg/dL (ref 0.57–2.63)

## 2013-07-27 LAB — IGG, IGA, IGM
IgG (Immunoglobin G), Serum: 645 mg/dL — ABNORMAL LOW (ref 650–1600)
IgM, Serum: 21 mg/dL — ABNORMAL LOW (ref 41–251)

## 2013-07-27 LAB — IFE INTERPRETATION

## 2013-07-27 MED ORDER — GADOBENATE DIMEGLUMINE 529 MG/ML IV SOLN
19.0000 mL | Freq: Once | INTRAVENOUS | Status: AC | PRN
  Administered 2013-07-27: 19 mL via INTRAVENOUS

## 2013-07-27 NOTE — Progress Notes (Signed)
DIAGNOSES: 1. IgG kappa myeloma - progression. 2. Deep venous thrombosis/pulmonary embolism. 3. History of perforated diverticulum.  CURRENT THERAPY: 1. Revlimid 15 mg p.o. daily (21 days on/7 days off). 2. Radiation therapy to the lumbosacral spine for recurrence. 3. Zometa 4 mg IV monthly. 4. Aspirin 162 mg p.o. daily.  INTERIM HISTORY:  Robert Berger comes in for his followup.  He has taken a bit of a "turn."  When we last saw him in November, he was doing great. He wanted go back to work.  He really felt that he could go back to work.  When we saw him back in November, his monoclonal spike was down to 1 g/dL.  This has been a very nice response since being on Revlimid. However, he then presented to his family doctor with low back and left leg pain.  She did an MRI.  Shockingly enough, the MRI showed extensive disease recurrence.  He had replacement of L5.  He had expansile tumor at S1 and S2.  This was eroding through the posterior vertebral body. It was engulfing the left S1 nerve.  The left S2 nerve also was involved.  I am just absolutely shocked by this.  Again, his myeloma numbers were improving quite nicely.  I suppose that he may have developed a nonsecretory type of progression.  We got him on to radiation therapy.  He started that, I think, earlier this week.  He is on steroids.  Decadron has not been good to him.  He has had really hard time with Decadron.  As such, we will have to consider switching him over to prednisone.  Maybe this will be a little more tolerable for him.  He states he is not sleeping because of the Decadron.  Again, we are going to have to re-stage him.  His last bone survey was done back in March.  I want to get a 24-hour urine on him so that we can see if he is secreting protein into his urine and not so much into his blood.  He is complaining of pain in the left hand.  I think we will have to do an MRI of his thoracic and cervical  spine.  We are going to have to do another bone marrow test on him.  I think this would be important as I needed to get cytogenetics for the recurrence.  I cannot imagine this being any other kind of malignancy that could be doing this.  I am just very shocked as to how things have transpired over the past month.  Again, he was doing so well.  He is not having any problems with his colostomy.  He is on Percocet for low back pain.  This is more of an issue.  I told him we could try some long-acting pain medication.  He wants to hold off on this for right now and just go with Percocet.  His appetite is doing okay.  He has had no nausea or vomiting.  He has had no problems with his urine.  He has had no leg swelling.  He has had no rashes.  Overall, his performance status is ECOG 2.  PHYSICAL EXAMINATION:  General:  This is a fairly well-developed, well- nourished white gentleman, in no obvious distress.  Vital Signs:  Show temperature of 98, pulse 98, respiratory 18, blood pressure 151/68, weight is 203 pounds.  Head and Neck Exam:  Shows a normocephalic, atraumatic skull.  There are no ocular or oral  lesions.  There are no palpable cervical or supraclavicular lymph nodes.  Lungs:  Clear bilaterally.  Cardiac Exam:  Regular rate and rhythm with a normal S1 and S2.  There are no murmurs, rubs, or bruits.  Abdomen:  Soft.  He has good bowel sounds.  There is no fluid wave.  There is no palpable hepatosplenomegaly.  He has a colostomy in the left lower quadrant. Back Exam:  Shows no tenderness over the spine.  He has no muscle spasms in the paravertebral muscles.  Extremities:  Shows decent strength in his legs.  He has some decreased range of motion of his left lower extremity joints.  He has some slight edema.  Neurological Exam:  Shows no focal neurological deficits.  LABORATORY STUDIES:  White cell count is 8.9, hemoglobin 12.9, hematocrit 38.2, and platelet count is  64,000.  His BUN is 21, creatinine 0.5.  Calcium 8.8 with an albumin of 2.7. Total protein 5.4.  His SGPT is 166 and SGOT of 59.  IMPRESSION:  Robert Berger is a 72 year old gentleman.  He has IgG kappa myeloma.  He has been doing very nicely with this.  Unfortunately, it looks like we now seem to have rapid progression.  I am just surprised given that his monoclonal spike was doing better.  It will be very interesting to see what his monoclonal spike is right now.  Again, we will have to re-stage him.  I noted that his liver function tests are elevated.  This may be from medications.  I would not think that it would be from myeloma.  However, we will need to follow up on this when we see him back.  We will see what our MRIs show.  We will see what a bone marrow biopsy shows.  We may have to consider changing therapy on him.  Kyprolis certainly would be a reasonable option for him.  He did not do well with Velcade. Kyprolis would be a possibility.  Pomalidomide also would be a possibility.  I think the chromosomal analysis with his bone marrow biopsy will be critical in making decisions for future therapy.  I am just surprised as to how Robert Berger has done.  We will get him back to see [place] in another 3 weeks.  I spent about 45 minutes with him and his wife.  As always, Robert Berger is a very nice and very appreciable; everybody trying to help him out.    ______________________________ [name], M.D. PRE/MEDQ  D:  07/25/2013  T:  07/26/2013  Job:  [id number]

## 2013-07-28 ENCOUNTER — Ambulatory Visit (HOSPITAL_BASED_OUTPATIENT_CLINIC_OR_DEPARTMENT_OTHER): Payer: BC Managed Care – PPO

## 2013-07-28 ENCOUNTER — Other Ambulatory Visit: Payer: Self-pay | Admitting: Hematology & Oncology

## 2013-07-28 ENCOUNTER — Other Ambulatory Visit: Payer: Self-pay | Admitting: Family

## 2013-07-28 MED ORDER — MELOXICAM 15 MG PO TABS: 15.0000 mg | ORAL_TABLET | Freq: Every day | ORAL | Status: DC

## 2013-07-28 NOTE — Addendum Note (Signed)
Addended by: Arlan Organ R on: 07/28/2013 10:43 AM   Modules accepted: Orders

## 2013-07-30 ENCOUNTER — Inpatient Hospital Stay (HOSPITAL_COMMUNITY): Payer: Medicare Other

## 2013-07-30 ENCOUNTER — Encounter (HOSPITAL_COMMUNITY): Payer: Self-pay

## 2013-07-30 ENCOUNTER — Ambulatory Visit
Admission: RE | Admit: 2013-07-30 | Discharge: 2013-07-30 | Disposition: A | Discharge status: Home / Self Care | Payer: BC Managed Care – PPO | Source: Ambulatory Visit | Attending: Radiation Oncology | Admitting: Radiation Oncology

## 2013-07-30 ENCOUNTER — Other Ambulatory Visit (HOSPITAL_BASED_OUTPATIENT_CLINIC_OR_DEPARTMENT_OTHER): Payer: BC Managed Care – PPO

## 2013-07-30 ENCOUNTER — Ambulatory Visit (HOSPITAL_BASED_OUTPATIENT_CLINIC_OR_DEPARTMENT_OTHER)
Admission: RE | Admit: 2013-07-30 | Discharge: 2013-07-30 | Disposition: A | Discharge status: Home / Self Care | Payer: Medicare Other | Source: Ambulatory Visit | Attending: Hematology & Oncology | Admitting: Hematology & Oncology

## 2013-07-30 ENCOUNTER — Telehealth: Payer: Self-pay | Admitting: *Deleted

## 2013-07-30 ENCOUNTER — Inpatient Hospital Stay (HOSPITAL_BASED_OUTPATIENT_CLINIC_OR_DEPARTMENT_OTHER)
Admission: AD | Admit: 2013-07-30 | Discharge: 2013-08-03 | DRG: 580 | Disposition: A | Discharge status: Home / Self Care | Payer: Medicare Other | Source: Ambulatory Visit | Attending: Internal Medicine | Admitting: Internal Medicine

## 2013-07-30 DIAGNOSIS — D61818 Other pancytopenia: Secondary | ICD-10-CM

## 2013-07-30 DIAGNOSIS — M4810 Ankylosing hyperostosis [Forestier], site unspecified: Secondary | ICD-10-CM

## 2013-07-30 DIAGNOSIS — E010 Iodine-deficiency related diffuse (endemic) goiter: Secondary | ICD-10-CM

## 2013-07-30 DIAGNOSIS — Z923 Personal history of irradiation: Secondary | ICD-10-CM

## 2013-07-30 DIAGNOSIS — K449 Diaphragmatic hernia without obstruction or gangrene: Secondary | ICD-10-CM

## 2013-07-30 DIAGNOSIS — K219 Gastro-esophageal reflux disease without esophagitis: Secondary | ICD-10-CM

## 2013-07-30 DIAGNOSIS — E119 Type 2 diabetes mellitus without complications: Secondary | ICD-10-CM | POA: Diagnosis present

## 2013-07-30 DIAGNOSIS — L03114 Cellulitis of left upper limb: Secondary | ICD-10-CM

## 2013-07-30 DIAGNOSIS — Z933 Colostomy status: Secondary | ICD-10-CM

## 2013-07-30 DIAGNOSIS — E871 Hypo-osmolality and hyponatremia: Secondary | ICD-10-CM

## 2013-07-30 DIAGNOSIS — I10 Essential (primary) hypertension: Secondary | ICD-10-CM

## 2013-07-30 DIAGNOSIS — R109 Unspecified abdominal pain: Secondary | ICD-10-CM

## 2013-07-30 DIAGNOSIS — C9000 Multiple myeloma not having achieved remission: Secondary | ICD-10-CM | POA: Diagnosis present

## 2013-07-30 DIAGNOSIS — D696 Thrombocytopenia, unspecified: Secondary | ICD-10-CM | POA: Diagnosis present

## 2013-07-30 DIAGNOSIS — M899 Disorder of bone, unspecified: Secondary | ICD-10-CM

## 2013-07-30 DIAGNOSIS — K5641 Fecal impaction: Secondary | ICD-10-CM

## 2013-07-30 DIAGNOSIS — IMO0002 Reserved for concepts with insufficient information to code with codable children: Principal | ICD-10-CM | POA: Diagnosis present

## 2013-07-30 DIAGNOSIS — L0291 Cutaneous abscess, unspecified: Secondary | ICD-10-CM

## 2013-07-30 DIAGNOSIS — D72819 Decreased white blood cell count, unspecified: Secondary | ICD-10-CM | POA: Diagnosis present

## 2013-07-30 DIAGNOSIS — E669 Obesity, unspecified: Secondary | ICD-10-CM

## 2013-07-30 DIAGNOSIS — M549 Dorsalgia, unspecified: Secondary | ICD-10-CM

## 2013-07-30 DIAGNOSIS — Z7982 Long term (current) use of aspirin: Secondary | ICD-10-CM

## 2013-07-30 DIAGNOSIS — K6289 Other specified diseases of anus and rectum: Secondary | ICD-10-CM

## 2013-07-30 DIAGNOSIS — Z86711 Personal history of pulmonary embolism: Secondary | ICD-10-CM

## 2013-07-30 DIAGNOSIS — I2699 Other pulmonary embolism without acute cor pulmonale: Secondary | ICD-10-CM

## 2013-07-30 DIAGNOSIS — R5381 Other malaise: Secondary | ICD-10-CM

## 2013-07-30 DIAGNOSIS — M129 Arthropathy, unspecified: Secondary | ICD-10-CM | POA: Diagnosis present

## 2013-07-30 DIAGNOSIS — Z79899 Other long term (current) drug therapy: Secondary | ICD-10-CM

## 2013-07-30 DIAGNOSIS — K5900 Constipation, unspecified: Secondary | ICD-10-CM

## 2013-07-30 DIAGNOSIS — F329 Major depressive disorder, single episode, unspecified: Secondary | ICD-10-CM

## 2013-07-30 DIAGNOSIS — M79602 Pain in left arm: Secondary | ICD-10-CM

## 2013-07-30 DIAGNOSIS — I2510 Atherosclerotic heart disease of native coronary artery without angina pectoris: Secondary | ICD-10-CM

## 2013-07-30 LAB — GLUCOSE, CAPILLARY
Glucose-Capillary: 166 mg/dL — ABNORMAL HIGH (ref 70–99)
Glucose-Capillary: 114 mg/dL — ABNORMAL HIGH (ref 70–99)

## 2013-07-30 LAB — HEMOGLOBIN A1C
Hgb A1c MFr Bld: 6.3 % — ABNORMAL HIGH (ref <5.7)
Mean Plasma Glucose: 134 mg/dL — ABNORMAL HIGH (ref <117)

## 2013-07-30 LAB — CREATININE, SERUM
Creatinine, Ser: 0.66 mg/dL (ref 0.50–1.35)
GFR calc Af Amer: >90 mL/min (ref >90)
GFR calc non Af Amer: >90 mL/min (ref >90)

## 2013-07-30 LAB — CBC
Hemoglobin: 11.6 g/dL — ABNORMAL LOW (ref 13.0–17.0)
MCH: 33.3 pg (ref 26.0–34.0)
MCV: 95.4 fL (ref 78.0–100.0)
Platelets: 21 10*3/uL — CL (ref 150–400)
RBC: 3.48 MIL/uL — ABNORMAL LOW (ref 4.22–5.81)
RDW: 14.4 % (ref 11.5–15.5)

## 2013-07-30 MED ORDER — ACETAMINOPHEN 325 MG PO TABS
650.0000 mg | ORAL_TABLET | Freq: Four times a day (QID) | ORAL | Status: DC | PRN
  Administered 2013-08-02: 650 mg via ORAL
  Filled 2013-07-30: qty 2

## 2013-07-30 MED ORDER — IOHEXOL 300 MG/ML  SOLN
100.0000 mL | Freq: Once | INTRAMUSCULAR | Status: AC | PRN
  Administered 2013-07-30: 100 mL via INTRAVENOUS

## 2013-07-30 MED ORDER — BACLOFEN 10 MG PO TABS
10.0000 mg | ORAL_TABLET | Freq: Three times a day (TID) | ORAL | Status: DC
  Administered 2013-07-30 – 2013-08-03 (×13): 10 mg via ORAL
  Filled 2013-07-30 (×14): qty 1

## 2013-07-30 MED ORDER — ONDANSETRON HCL 4 MG/2ML IJ SOLN: 4.0000 mg | Freq: Four times a day (QID) | INTRAMUSCULAR | Status: DC | PRN

## 2013-07-30 MED ORDER — ONDANSETRON HCL 4 MG PO TABS: 4.0000 mg | ORAL_TABLET | Freq: Four times a day (QID) | ORAL | Status: DC | PRN

## 2013-07-30 MED ORDER — ENOXAPARIN SODIUM 40 MG/0.4ML ~~LOC~~ SOLN
40.0000 mg | SUBCUTANEOUS | Status: DC
  Filled 2013-07-30: qty 0.4

## 2013-07-30 MED ORDER — DEXTROSE 5 % IV SOLN
1.0000 g | Freq: Three times a day (TID) | INTRAVENOUS | Status: DC
  Administered 2013-07-30 – 2013-08-03 (×11): 1 g via INTRAVENOUS
  Filled 2013-07-30 (×14): qty 1

## 2013-07-30 MED ORDER — ASPIRIN EC 81 MG PO TBEC
162.0000 mg | DELAYED_RELEASE_TABLET | Freq: Every day | ORAL | Status: DC
  Administered 2013-07-30: 162 mg via ORAL
  Filled 2013-07-30: qty 2

## 2013-07-30 MED ORDER — HYDROMORPHONE HCL PF 1 MG/ML IJ SOLN
0.5000 mg | Freq: Once | INTRAMUSCULAR | Status: AC
  Administered 2013-07-30: 0.5 mg via INTRAVENOUS
  Filled 2013-07-30: qty 1

## 2013-07-30 MED ORDER — DILTIAZEM HCL ER COATED BEADS 120 MG PO CP24
120.0000 mg | ORAL_CAPSULE | Freq: Every morning | ORAL | Status: DC
  Administered 2013-07-30 – 2013-08-03 (×5): 120 mg via ORAL
  Filled 2013-07-30 (×5): qty 1

## 2013-07-30 MED ORDER — PREDNISONE 20 MG PO TABS
20.0000 mg | ORAL_TABLET | Freq: Every day | ORAL | Status: DC
  Administered 2013-07-31 – 2013-08-03 (×4): 20 mg via ORAL
  Filled 2013-07-30 (×5): qty 1

## 2013-07-30 MED ORDER — SODIUM CHLORIDE 0.9 % IV SOLN
1250.0000 mg | Freq: Two times a day (BID) | INTRAVENOUS | Status: DC
  Administered 2013-07-30 – 2013-08-01 (×4): 1250 mg via INTRAVENOUS
  Filled 2013-07-30 (×5): qty 1250

## 2013-07-30 MED ORDER — VITAMIN B-6 50 MG PO TABS
250.0000 mg | ORAL_TABLET | Freq: Every day | ORAL | Status: DC
  Administered 2013-07-30 – 2013-08-03 (×5): 250 mg via ORAL
  Filled 2013-07-30 (×5): qty 1

## 2013-07-30 MED ORDER — ACETAMINOPHEN 650 MG RE SUPP: 650.0000 mg | Freq: Four times a day (QID) | RECTAL | Status: DC | PRN

## 2013-07-30 MED ORDER — HYDROMORPHONE HCL PF 1 MG/ML IJ SOLN
0.5000 mg | INTRAMUSCULAR | Status: DC | PRN
  Administered 2013-07-30 – 2013-08-03 (×17): 0.5 mg via INTRAVENOUS
  Filled 2013-07-30 (×17): qty 1

## 2013-07-30 MED ORDER — SODIUM CHLORIDE 0.9 % IV SOLN
INTRAVENOUS | Status: DC
  Administered 2013-07-30 – 2013-08-01 (×4): via INTRAVENOUS
  Administered 2013-08-01: 75 mL/h via INTRAVENOUS
  Administered 2013-08-03: 12:00:00 via INTRAVENOUS

## 2013-07-30 MED ORDER — INSULIN ASPART 100 UNIT/ML ~~LOC~~ SOLN
3.0000 [IU] | Freq: Three times a day (TID) | SUBCUTANEOUS | Status: DC
  Administered 2013-07-30 – 2013-08-03 (×10): 3 [IU] via SUBCUTANEOUS

## 2013-07-30 MED ORDER — SENNOSIDES-DOCUSATE SODIUM 8.6-50 MG PO TABS: 1.0000 | ORAL_TABLET | Freq: Every evening | ORAL | Status: DC | PRN

## 2013-07-30 MED ORDER — INSULIN ASPART 100 UNIT/ML ~~LOC~~ SOLN
0.0000 [IU] | Freq: Three times a day (TID) | SUBCUTANEOUS | Status: DC
  Administered 2013-07-30 – 2013-07-31 (×2): 2 [IU] via SUBCUTANEOUS
  Administered 2013-07-31: 4 [IU] via SUBCUTANEOUS
  Administered 2013-08-01 (×2): 1 [IU] via SUBCUTANEOUS
  Administered 2013-08-01: 2 [IU] via SUBCUTANEOUS
  Administered 2013-08-02: 1 [IU] via SUBCUTANEOUS
  Administered 2013-08-02: 2 [IU] via SUBCUTANEOUS

## 2013-07-30 MED ORDER — PANTOPRAZOLE SODIUM 20 MG PO TBEC
20.0000 mg | DELAYED_RELEASE_TABLET | Freq: Every day | ORAL | Status: DC
  Administered 2013-07-30 – 2013-08-03 (×5): 20 mg via ORAL
  Filled 2013-07-30 (×5): qty 1

## 2013-07-30 NOTE — Telephone Encounter (Signed)
Patient was admitted to [facility] room 1319, neg, for DVT U/S /LT Upper extremity, cn have radiation thrapy today, FYI, called and spoker with Ana,RT, informed of patient status,will in basket Dr.Kinard who is out of office today, will  Be back tomorrow and Dr.Kinard's nurse,Karen,Hess<RN 2:13 PM

## 2013-07-30 NOTE — Progress Notes (Signed)
ANTIBIOTIC CONSULT NOTE - INITIAL  Pharmacy Consult for vancomycin, aztreonam Indication: cellulitis  Allergies  Allergen Reactions  . Penicillins Nausea And Vomiting, Swelling and Rash    Patient Measurements: Height: 5\' 7"  (170.2 cm) Weight: 200 lb (90.719 kg) IBW/kg (Calculated) : 66.1 Adjusted Body Weight: 76kg  Vital Signs: Temp: 98.6 F (37 C) (12/22 1229) Temp src: Oral (12/22 1229) BP: 156/55 mmHg (12/22 1229) Pulse Rate: 112 (12/22 1229)  Medical History: Past Medical History  Diagnosis Date  . Arthritis   . GERD (gastroesophageal reflux disease)   . Complication of anesthesia     aspiration during surgery, difficult to wake up  . Multiple myeloma   . Status post radiation therapy 03/23/12 - 04/05/12    T8 - L4/ 20 Gy/ 10 Fractions  . Dysphagia 05/06/12    ED Visit    Medications:  Scheduled:  . aspirin  162 mg Oral Daily  . aztreonam  1 g Intravenous Q8H  . baclofen  10 mg Oral TID  . diltiazem  120 mg Oral q morning - 10a  . enoxaparin (LOVENOX) injection  40 mg Subcutaneous Q24H  . insulin aspart  0-9 Units Subcutaneous TID WC  . insulin aspart  3 Units Subcutaneous TID WC  . pantoprazole  20 mg Oral Daily  . [START ON 07/31/2013] predniSONE  20 mg Oral Q breakfast  . vitamin B-6  250 mg Oral Daily  . vancomycin  1,250 mg Intravenous Q12H   Infusions:  . sodium chloride     Assessment: 72 yoM w Hx multiple myeloma admitted 12/22 with cellulitis starting 2 days PTA directly admitted from MedOnc's office for IV antibiotics. Pt on lenalidomide 15mg  daily x 3 weeks, then 1 week off, LD on 12/20, to resume 15mg  daily on 12/28. Patient also with Hx DM. Pharmacy has been consulted to dose vancomycin and aztreonam for cellulitis in this diabetic patient with an allergy to penicillins.  Antiinfectives: 12/22 >> vancomycin >> 12/22 >> aztreonam >>   Tmax: afebrile WBCs: 8.9 (12/17) Renal: SCr 0.5 (12/17), CrCl 89 (using adj wt=76kg and SCr=0.8), 85N  (using SCr= 0.8)  Microbiology: 12/22 blood x2: ordered  Noted that blood cultures have been ordered but not yet drawn therefore will time the antibiotics to start a little later to allow these cultures to be drawn. Noted that patient is non-toxic appearing per MD note so I feel ok holding the antibiotics for a small time  Also noted that vancomycin and aztreonam have variable compatibility so the medications will be timed accordingly.   Goal of Therapy:  Vancomycin trough level 10-15 mcg/ml eradication of infection  Plan:  - vancomycin 1250mg  IV q12h to start at 1600 - aztreonam 1g IV q8h to start at 1800 - vancomycin trough at steady state if indicated - follow-up clinical course, culture results, renal function - follow-up antibiotic de-escalation and length of therapy   Thank you for the consult.  Tomi Bamberger, PharmD, BCPS Clinical Pharmacist Pager: [phone] Pharmacy: [phone] 07/30/2013 2:41 PM

## 2013-07-30 NOTE — Progress Notes (Signed)
CRITICAL VALUE ALERT  Critical value received:  Plt 21  Date of notification:  t  Time of notification:  1547  Critical value read back:yes  Nurse who received alert; Carlyle Lipa, RN  MD notified (1st page):  Dr Ardyth Harps  Time of first page:  1600  MD notified (2nd page):  Time of second page:  Responding MD:  Dr. Ardyth Harps  Time MD responded:  [phone]

## 2013-07-30 NOTE — H&P (Signed)
[facility]          History and Physical    PCP:   Lemont Fillers., NP   Chief Complaint:  Left arm pain, redness, swelling  HPI: Patient is a 72 year old white man with history of multiple myeloma with recent progression with metastatic lesions to his lumbar spine for which he is currently receiving radiation therapy, diabetes mellitus, he also wears a colostomy bag for a prior episode of diverticulitis with perforation. He states that about 2 days ago he started noticing left arm pain redness and swelling that progressed to the point where he became concerned. He called Dr. Myna Hidalgo scheduled him today for an ultrasound to make sure he did not have a DVT. Ultrasound was negative, however when Dr. Myna Hidalgo examined his arm he decided to directly admit him to the hospital for IV antibiotics. He denies fevers, chills. We have been asked to admit him for further evaluation and management.  Allergies:   Allergies  Allergen Reactions  . Penicillins Nausea And Vomiting, Swelling and Rash      Past Medical History  Diagnosis Date  . Arthritis   . GERD (gastroesophageal reflux disease)   . Complication of anesthesia     aspiration during surgery, difficult to wake up  . Multiple myeloma   . Status post radiation therapy 03/23/12 - 04/05/12    T8 - L4/ 20 Gy/ 10 Fractions  . Dysphagia 05/06/12    ED Visit    Past Surgical History  Procedure Laterality Date  . Hemorrhoid surgery    . Laparotomy  05/14/2012    Procedure: EXPLORATORY LAPAROTOMY;  Surgeon: Velora Heckler, MD;  Location: [facility];  Service: General;  Laterality: N/A;  . Colostomy  05/14/2012    Procedure: COLOSTOMY;  Surgeon: Velora Heckler, MD;  Location: [facility];  Service: General;  Laterality: N/A;  . Back surgery      47 years ago  . Fracture surgery      ankle    Prior to Admission medications   Medication Sig Start Date End Date Taking? Authorizing Provider  aspirin 81 MG tablet Take 162 mg by  mouth daily.    Yes Historical Provider, MD  baclofen (LIORESAL) 10 MG tablet Take 10 mg by mouth 3 (three) times daily. 02/26/13  Yes Sandford Craze, NP  diltiazem (CARDIZEM CD) 120 MG 24 hr capsule Take 120 mg by mouth every morning. 02/26/13  Yes Sandford Craze, NP  HYDROmorphone (DILAUDID) 2 MG tablet Take 2-4 mg by mouth every 4 (four) hours as needed for moderate pain or severe pain. 07/26/13  Yes [name], MD  lansoprazole (PREVACID) 30 MG capsule TAKE ONE CAPSULE EVERY DAY 07/28/13  Yes Sandford Craze, NP  lenalidomide (REVLIMID) 15 MG capsule Take 15 mg by mouth daily. Take 1 cap daily for 21 days then 7 days off.  Authorization # is [id number] 07/13/13  Yes [name], MD  lisinopril (PRINIVIL,ZESTRIL) 2.5 MG tablet Take 2.5 mg by mouth daily with breakfast.  01/30/13  Yes Historical Provider, MD  meloxicam (MOBIC) 15 MG tablet Take 1 tablet (15 mg total) by mouth daily. 07/28/13  Yes [name], MD  ondansetron (ZOFRAN-ODT) 4 MG disintegrating tablet Take 4 mg by mouth every 12 (twelve) hours as needed for nausea.    Yes Historical Provider, MD  predniSONE (DELTASONE) 20 MG tablet Take 20 mg by mouth daily with breakfast. Take 1 pill ady with food. 07/25/13  Yes [name], MD  Pyridoxine HCl (VITAMIN B-6) 250 MG tablet Take 250 mg by mouth daily. 06/21/13  Yes [name], MD    Social History:  reports that he quit smoking about 35 years ago. His smoking use included Cigarettes. He smoked 0.00 packs per day. He has never used smokeless tobacco. He reports that he does not drink alcohol or use illicit drugs.  Family History  Problem Relation Age of Onset  . Cancer Maternal Grandfather   . Cancer Maternal Grandmother     Review of Systems:  Constitutional: Denies fever, chills, diaphoresis, appetite change and fatigue.  HEENT: Denies photophobia, eye pain, redness, hearing loss, ear pain, congestion, sore throat, rhinorrhea, sneezing, mouth sores,  trouble swallowing, neck pain, neck stiffness and tinnitus.   Respiratory: Denies SOB, DOE, cough, chest tightness,  and wheezing.   Cardiovascular: Denies chest pain, palpitations and leg swelling.  Gastrointestinal: Denies nausea, vomiting, abdominal pain, diarrhea, constipation, blood in stool and abdominal distention.  Genitourinary: Denies dysuria, urgency, frequency, hematuria, flank pain and difficulty urinating.  Endocrine: Denies: hot or cold intolerance, sweats, changes in hair or nails, polyuria, polydipsia. Musculoskeletal: Denies myalgias, joint swelling, arthralgias and gait problem.  Skin: Denies pallor, rash and wound.  Neurological: Denies dizziness, seizures, syncope, weakness, light-headedness, numbness and headaches.  Hematological: Denies adenopathy. Easy bruising, personal or family bleeding history  Psychiatric/Behavioral: Denies suicidal ideation, mood changes, confusion, nervousness, sleep disturbance and agitation   Physical Exam: Blood pressure 156/55, pulse 112, temperature 98.6 F (37 C), temperature source Oral, resp. rate 18, height 5\' 7"  (1.702 m), weight 90.719 kg (200 lb). General: Alert, awake, oriented x3, no distress, appears unkempt. HEENT: Normocephalic, atraumatic, pupils equal round and reactive to light, extraocular movements intact, moist mucous membranes. Neck: Supple, no JVD, no lymphadenopathy, no bruits, no goiter. Cardiovascular: Regular rate and rhythm, no murmurs, rubs or gallops. Lungs: Clear to auscultation Bilaterally. Abdomen: Soft, nontender, nondistended, positive bowel sounds, colostomy back. Extremities: Lower extremities: No clubbing, cyanosis or edema, positive pedal pulses., Left upper extremity there is erythema and edema from the wrist all the way up to the elbow. Neurologic: Grossly intact and nonfocal  Labs on Admission:  No results found for this or any previous visit (from the past 48 hour(s)).  Radiological Exams on  Admission: US Venous Img Upper Uni Left  07/30/2013   CLINICAL DATA:  Left forearm pain swelling and redness history of pulmonary embolus and previous inferior vena caval filter placement.  EXAM: Left UPPER EXTREMITY VENOUS DOPPLER ULTRASOUND  TECHNIQUE: Gray-scale sonography with graded compression, as well as color Doppler and duplex ultrasound were performed to evaluate the upper extremity deep venous system from the level of the subclavian vein and including the jugular, axillary, basilic and upper cephalic vein. Spectral Doppler was utilized to evaluate flow at rest and with distal augmentation maneuvers.  COMPARISON:  None.  FINDINGS: Thrombus within deep veins:  None visualized.  Compressibility of deep veins:  Normal.  Duplex waveform respiratory phasicity:  Normal.  Duplex waveform response to augmentation:  Normal.  Venous reflux:  None visualized.  Other findings: There is mild superficial soft tissue edema over the forearm. Incidental note is made of possible nodules within the left thyroid lobe.  IMPRESSION: 1. There is no evidence of deep venous thrombosis within the left upper extremity. 2. There is subcutaneous edema. 3. There may be a nodule or nodules in the left thyroid lobe.   Electronically Signed   By: David  [place]   On: 07/30/2013 09:25  Assessment/Plan Principal Problem:   Cellulitis of left arm Active Problems:   Multiple myeloma   Diabetes mellitus   Cellulitis of the left arm  -Is currently nontoxic. -Will order blood cultures x2. -Start on broad-spectrum antibiotics including vancomycin and aztreonam.  Multiple myeloma -With recent progression to lumbar spine. -4 radiation therapy today. -Will add Dr. Myna Hidalgo as a consult.  Diabetes mellitus -Check hemoglobin A1c. Extent-place on sensitive sliding scale insulin. -He does not appear to be on any hypoglycemic agents at home.  DVT prophylaxis -Lovenox.  CODE STATUS -Full code   Time Spent on  Admission: 75 minutes  HERNANDEZ ACOSTA,ESTELA [facility] Pager: [phone] 07/30/2013, 2:12 PM

## 2013-07-31 ENCOUNTER — Ambulatory Visit
Admission: RE | Admit: 2013-07-31 | Discharge: 2013-07-31 | Disposition: A | Discharge status: Home / Self Care | Payer: BC Managed Care – PPO | Source: Ambulatory Visit | Attending: Radiation Oncology | Admitting: Radiation Oncology

## 2013-07-31 ENCOUNTER — Ambulatory Visit (HOSPITAL_COMMUNITY): Admission: RE | Admit: 2013-07-31 | Payer: Medicare Other | Source: Ambulatory Visit

## 2013-07-31 ENCOUNTER — Inpatient Hospital Stay (HOSPITAL_COMMUNITY)
Admission: AD | Admit: 2013-07-31 | Discharge: 2013-07-31 | Disposition: A | Discharge status: Home / Self Care | Payer: Medicare Other | Source: Ambulatory Visit | Attending: Hematology & Oncology | Admitting: Hematology & Oncology

## 2013-07-31 ENCOUNTER — Encounter (HOSPITAL_COMMUNITY): Payer: Self-pay

## 2013-07-31 ENCOUNTER — Ambulatory Visit (HOSPITAL_COMMUNITY)
Admission: RE | Admit: 2013-07-31 | Discharge: 2013-07-31 | Disposition: A | Discharge status: Home / Self Care | Payer: Medicare Other | Source: Ambulatory Visit | Attending: Hematology & Oncology | Admitting: Hematology & Oncology

## 2013-07-31 DIAGNOSIS — R109 Unspecified abdominal pain: Secondary | ICD-10-CM

## 2013-07-31 DIAGNOSIS — D696 Thrombocytopenia, unspecified: Secondary | ICD-10-CM

## 2013-07-31 DIAGNOSIS — C9030 Solitary plasmacytoma not having achieved remission: Secondary | ICD-10-CM

## 2013-07-31 DIAGNOSIS — D72819 Decreased white blood cell count, unspecified: Secondary | ICD-10-CM

## 2013-07-31 DIAGNOSIS — C9000 Multiple myeloma not having achieved remission: Secondary | ICD-10-CM

## 2013-07-31 LAB — UIFE/LIGHT CHAINS/TP QN, 24-HR UR
Albumin, U: DETECTED
Alpha 1, Urine: DETECTED — AB
Free Kappa Lt Chains,Ur: 14.2 mg/dL — ABNORMAL HIGH (ref 0.14–2.42)
Free Kappa/Lambda Ratio: 21.52 ratio — ABNORMAL HIGH (ref 2.04–10.37)
Free Lambda Excretion/Day: 14.52 mg/d
Free Lambda Lt Chains,Ur: 0.66 mg/dL (ref 0.02–0.67)
Time: 24 hours
Total Protein, Urine: 15.9 mg/dL
Volume, Urine: 2200 mL

## 2013-07-31 LAB — GLUCOSE, CAPILLARY: Glucose-Capillary: 177 mg/dL — ABNORMAL HIGH (ref 70–99)

## 2013-07-31 LAB — CBC
Hemoglobin: 10.3 g/dL — ABNORMAL LOW (ref 13.0–17.0)
MCH: 33 pg (ref 26.0–34.0)
MCHC: 34.7 g/dL (ref 30.0–36.0)
MCV: 95.2 fL (ref 78.0–100.0)
RBC: 3.12 MIL/uL — ABNORMAL LOW (ref 4.22–5.81)
WBC: 1.7 10*3/uL — ABNORMAL LOW (ref 4.0–10.5)

## 2013-07-31 LAB — BASIC METABOLIC PANEL
BUN: 10 mg/dL (ref 6–23)
CO2: 25 mEq/L (ref 19–32)
Glucose, Bld: 128 mg/dL — ABNORMAL HIGH (ref 70–99)
Potassium: 3.5 mEq/L (ref 3.5–5.1)
Sodium: 134 mEq/L — ABNORMAL LOW (ref 135–145)

## 2013-07-31 MED ORDER — MIDAZOLAM HCL 2 MG/2ML IJ SOLN
INTRAMUSCULAR | Status: AC | PRN
  Administered 2013-07-31: 2 mg via INTRAVENOUS

## 2013-07-31 MED ORDER — FENTANYL CITRATE 0.05 MG/ML IJ SOLN
INTRAMUSCULAR | Status: AC | PRN
  Administered 2013-07-31: 100 ug via INTRAVENOUS

## 2013-07-31 MED ORDER — FENTANYL CITRATE 0.05 MG/ML IJ SOLN
INTRAMUSCULAR | Status: AC
  Filled 2013-07-31: qty 4

## 2013-07-31 MED ORDER — MIDAZOLAM HCL 2 MG/2ML IJ SOLN
INTRAMUSCULAR | Status: AC
  Filled 2013-07-31: qty 4

## 2013-07-31 MED ORDER — SALINE SPRAY 0.65 % NA SOLN
1.0000 | NASAL | Status: DC | PRN
  Administered 2013-07-31: 1 via NASAL
  Filled 2013-07-31: qty 44

## 2013-07-31 NOTE — Procedures (Signed)
Technically successful CT guided bone marrow aspiration and biopsy of left iliac crest. No immediate complications. Awaiting pathology report.    

## 2013-07-31 NOTE — Progress Notes (Signed)
Patient ID: Robert Berger, male   DOB: 1940-09-19, 72 y.o.   MRN: [id number] Pt originally scheduled for CT guided bone marrow biopsy as OP today due to progression of multiple myeloma.Marland Kitchen He was admitted 12/22 for left arm cellulitis. Per Dr. Myna Hidalgo he would like BM biopsy performed today. Additional PMH as below. Exam: pt awake/alert; chest- CTA bilat; heart- RRR; abd- soft,+BS,NT,LLQ colostomy; ext- FROM except mildly decreased in left arm secondary to edema/pain of cellulitis; LE without edema.   Filed Vitals:   07/30/13 1229 07/30/13 2225 07/31/13 0517  BP: 156/55 115/55 116/63  Pulse: 112 93 92  Temp: 98.6 F (37 C) 99.5 F (37.5 C) 99.8 F (37.7 C)  TempSrc: Oral Oral Oral  Resp: 18 20 20   Height: 5\' 7"  (1.702 m)    Weight: 200 lb (90.719 kg)    SpO2:  96% 96%   Past Medical History  Diagnosis Date  . Arthritis   . GERD (gastroesophageal reflux disease)   . Complication of anesthesia     aspiration during surgery, difficult to wake up  . Multiple myeloma   . Status post radiation therapy 03/23/12 - 04/05/12    T8 - L4/ 20 Gy/ 10 Fractions  . Dysphagia 05/06/12    ED Visit   Past Surgical History  Procedure Laterality Date  . Hemorrhoid surgery    . Laparotomy  05/14/2012    Procedure: EXPLORATORY LAPAROTOMY;  Surgeon: Velora Heckler, MD;  Location: [facility];  Service: General;  Laterality: N/A;  . Colostomy  05/14/2012    Procedure: COLOSTOMY;  Surgeon: Velora Heckler, MD;  Location: [facility];  Service: General;  Laterality: N/A;  . Back surgery      47 years ago  . Fracture surgery      ankle   Dg Forearm Left  07/26/2013   CLINICAL DATA:  History of multiple myeloma and left arm pain. Evaluate for lesion or fracture.  EXAM: LEFT FOREARM - 2 VIEW  COMPARISON:  Left elbow 04/25/2012  FINDINGS: Two views of the forearm were obtained. No evidence for fracture or dislocation. No suspicious bony lesions are identified. Small calcifications or densities in the soft tissues.   IMPRESSION: No acute bone abnormality in the left forearm.   Electronically Signed   By: Richarda Overlie M.D.   On: 07/26/2013 13:58   Mr Lumbar Spine Wo Contrast  07/07/2013   CLINICAL DATA:  72 year old male with multiple myeloma. Severe right side low back pain. Left side pain radiating to the lower extremity. Initial encounter.  EXAM: MRI LUMBAR SPINE WITHOUT CONTRAST  TECHNIQUE: Multiplanar, multisequence MR imaging was performed. No intravenous contrast was administered.  COMPARISON:  CT Abdomen and Pelvis 03/19/2013. Lumbar MRI 12/20/2012.  FINDINGS: Same numbering system as on the comparisons.  Sequelae of T12 pathologic compression fracture status post augmentation and adjacent T11 vertebral body augmentation appear stable since 12/20/2012. Partially visible T10 level, also was seen to be augmented in August. Visualized lower thoracic spinal cord is normal with conus medularis at L1. There is a chronic right paracentral disc protrusion at T12-L1, stable since May.  Abnormal bone marrow signal now throughout the lumbar spine, with heterogeneous loss of the normal fatty marrow signal which predominated in May.  The L1 and L2 levels remain intact and demonstrate no epidural or foraminal tumor. Chronic L2-L3 central disc protrusion has not significantly changed. No significant spinal stenosis.  The L3 level has been augmented, and appears stable since August. The posterior L3  vertebral body tumor appears mildly progressed in overall size (now up to 19 mm diameter, 15 mm in May. No epidural or extraosseous tumor at this level. Central disc protrusion at L3 appears stable (series 5, image 24). Mild left lateral recess stenosis as before.  The L4 vertebra remains intact. Central L4 disc protrusion superimposed on facet hypertrophy with bilateral lateral recess stenosis is stable. No significant spinal stenosis. No epidural or extraosseous tumor at this level.  The L5 level is now almost completely replaced by  tumor. Still, no epidural or foraminal disease is identified. Stable epidural lipomatosis. Stable superimposed L5-S1 facet degeneration.  New expansile tumor centered at the lower S1/S1 S2 level, has eroded through the posterior vertebral body and extends in the epidural space to the left of midline. The exiting left S1 nerve is engulfed by tumor (series 7, image 39). The descending left S2 nerve roots are engulfed (image 38). No sacral pathologic fracture identified.  There is now diffusely abnormal marrow in the visible medial iliac bones.  IMPRESSION: 1. Progressed multiple myeloma in the L5 vertebra and sacrum. Bulky central sacral tumor which has eroded the posterior vertebral body cortex and tracks in the sacral epidural space engulfing the exiting left S1 and descending S2 nerve roots. 2. Progressed myelomatous involvement of the remaining lumbar levels, but with a miliary pattern of osseous involvement and no extraosseous tumor. Stable L3 vertebral body augmentation since August. 3. Stable pathologic fractures and augmentation in the visible lower thoracic spine. 4. Superimposed lumbar spine degeneration with multilevel disc herniations, not significantly changed.  These results will be called to the ordering clinician or representative by the Radiologist Assistant, and communication documented in the PACS Dashboard.   Electronically Signed   By: Augusto Gamble M.D.   On: 07/07/2013 17:32   Mr Cervical Spine W Wo Contrast  07/27/2013   CLINICAL DATA:  Multiple myeloma.  Severe left arm pain.  EXAM: MRI CERVICAL SPINE WITHOUT AND WITH CONTRAST  TECHNIQUE: Multiplanar and multiecho pulse sequences of the cervical spine, to include the craniocervical junction and cervicothoracic junction, were obtained according to standard protocol without and with intravenous contrast.  CONTRAST:  19 ml MultiHance.  COMPARISON:  Cervical spine radiographs 10/30/2012. Cervical MRI 03/15/2012.  FINDINGS: Compared with the prior  examination, there has slightly progressive heterogeneity of marrow signal and enhancement, most consistent with diffuse myelomatous involvement. There is no evidence of epidural tumor or pathologic fracture.  The craniocervical junction appears normal. The cervical cord is normal in signal and caliber. There is no abnormal intradural enhancement. Bilateral vertebral artery flow voids are present.  Asymmetric enlargement of the left thyroid lobe is again noted, without substantial change.  There is stable relatively mild spondylosis with posterior osteophytes and uncinate spurring at C5-6 and C6-7. The resulting biforaminal stenosis, greater at C5-6, is unchanged. There is no cord deformity.  IMPRESSION: 1. Minimally progressive marrow heterogeneity throughout the cervical spine consistent with multiple myeloma. No pathologic fracture or epidural tumor demonstrated. 2. Stable cervical spondylosis with uncinate spurring, greatest at C5-6 where there is stable moderate biforaminal stenosis. This could contribute to chronic exiting nerve root encroachment. No acute findings are identified.   Electronically Signed   By: Roxy Horseman M.D.   On: 07/27/2013 11:57   Mr Thoracic Spine W Wo Contrast  07/27/2013   CLINICAL DATA:  Multiple myeloma.  Right-sided back pain.  EXAM: MRI THORACIC SPINE WITHOUT AND WITH CONTRAST  TECHNIQUE: Multiplanar and multiecho pulse sequences of the  thoracic spine were obtained without and with intravenous contrast.  CONTRAST:  19mL MULTIHANCE GADOBENATE DIMEGLUMINE 529 MG/ML IV SOLN  COMPARISON:  None.  FINDINGS: Flowing osteophytes fuse the thoracic spine from T3 through T11. Does the patient have a history of ankylosing spondylitis? The disc spaces are essentially gone from T5-6 through T10-11.  There is evidence of multiple lesions in the thoracic spine including the T2, T5, T6, T9, T10, T11, and in the lumbar spine at the L1 and L2 and L3 vertebra. There are also lesions in the right  2nd and 7th ribs posteriorly and in the left 5th rib.  There is an old pathologic fracture of T12. The T9 through T12 vertebra have been treated with vertebroplasty.  The thoracic spinal cord appears normal. There is no compression of the thecal sac. There are no new pathologic fractures. Paraspinal soft tissues appear normal.  IMPRESSION: 1. Numerous lesions consistent with myeloma in the thoracic spine and ribs. 2. No compression of the thoracic spinal cord or of the nerves in the thoracic spine.   Electronically Signed   By: Geanie Cooley M.D.   On: 07/27/2013 12:12   Ct Forearm Left W Contrast  07/30/2013   CLINICAL DATA:  Forearm abscess.  Cellulitis.  Fasciitis.  EXAM: CT OF THE LEFT FOREARM WITH CONTRAST  TECHNIQUE: Multidetector CT imaging was performed following the standard protocol during bolus administration of intravenous contrast.  CONTRAST:  OMNIPAQUE IOHEXOL 300 MG/ML  SOLN  COMPARISON:  None.  FINDINGS: Study is technically degraded. The arm had to be scanned over the patient's abdomen due to body habitus. There is no gas identified in the soft tissues of the forearm. No discrete fluid collection is identified. Radius, ulna and humerus appear normal. The visualize carpal bones also appear normal.  IMPRESSION: Technically degraded study by positioning and body habitus. No gas within the soft tissues to suggest fasciitis. No abscess.   Electronically Signed   By: Andreas Newport M.D.   On: 07/30/2013 23:24   US Venous Img Upper Uni Left  07/30/2013   CLINICAL DATA:  Left forearm pain swelling and redness history of pulmonary embolus and previous inferior vena caval filter placement.  EXAM: Left UPPER EXTREMITY VENOUS DOPPLER ULTRASOUND  TECHNIQUE: Gray-scale sonography with graded compression, as well as color Doppler and duplex ultrasound were performed to evaluate the upper extremity deep venous system from the level of the subclavian vein and including the jugular, axillary, basilic  and upper cephalic vein. Spectral Doppler was utilized to evaluate flow at rest and with distal augmentation maneuvers.  COMPARISON:  None.  FINDINGS: Thrombus within deep veins:  None visualized.  Compressibility of deep veins:  Normal.  Duplex waveform respiratory phasicity:  Normal.  Duplex waveform response to augmentation:  Normal.  Venous reflux:  None visualized.  Other findings: There is mild superficial soft tissue edema over the forearm. Incidental note is made of possible nodules within the left thyroid lobe.  IMPRESSION: 1. There is no evidence of deep venous thrombosis within the left upper extremity. 2. There is subcutaneous edema. 3. There may be a nodule or nodules in the left thyroid lobe.   Electronically Signed   By: David  [place]   On: 07/30/2013 09:25  Results for orders placed during the hospital encounter of 07/30/13  CBC      Result Value Range   WBC 1.9 (*) 4.0 - 10.5 K/uL   RBC 3.48 (*) 4.22 - 5.81 MIL/uL   Hemoglobin 11.6 (*)  13.0 - 17.0 g/dL   HCT 04.5 (*) 40.9 - 81.1 %   MCV 95.4  78.0 - 100.0 fL   MCH 33.3  26.0 - 34.0 pg   MCHC 34.9  30.0 - 36.0 g/dL   RDW 91.4  78.2 - 95.6 %   Platelets 21 (*) 150 - 400 K/uL  CREATININE, SERUM      Result Value Range   Creatinine, Ser 0.66  0.50 - 1.35 mg/dL   GFR calc non Af Amer >90  >90 mL/min   GFR calc Af Amer >90  >90 mL/min  HEMOGLOBIN A1C      Result Value Range   Hemoglobin A1C 6.3 (*) <5.7 %   Mean Plasma Glucose 134 (*) <117 mg/dL  BASIC METABOLIC PANEL      Result Value Range   Sodium 134 (*) 135 - 145 mEq/L   Potassium 3.5  3.5 - 5.1 mEq/L   Chloride 101  96 - 112 mEq/L   CO2 25  19 - 32 mEq/L   Glucose, Bld 128 (*) 70 - 99 mg/dL   BUN 10  6 - 23 mg/dL   Creatinine, Ser 2.13  0.50 - 1.35 mg/dL   Calcium 8.1 (*) 8.4 - 10.5 mg/dL   GFR calc non Af Amer >90  >90 mL/min   GFR calc Af Amer >90  >90 mL/min  CBC      Result Value Range   WBC 1.7 (*) 4.0 - 10.5 K/uL   RBC 3.12 (*) 4.22 - 5.81 MIL/uL    Hemoglobin 10.3 (*) 13.0 - 17.0 g/dL   HCT 08.6 (*) 57.8 - 46.9 %   MCV 95.2  78.0 - 100.0 fL   MCH 33.0  26.0 - 34.0 pg   MCHC 34.7  30.0 - 36.0 g/dL   RDW 62.9  52.8 - 41.3 %   Platelets 26 (*) 150 - 400 K/uL  GLUCOSE, CAPILLARY      Result Value Range   Glucose-Capillary 166 (*) 70 - 99 mg/dL  SAVE SMEAR      Result Value Range   Smear Review SMEAR STAINED AND AVAILABLE FOR REVIEW    GLUCOSE, CAPILLARY      Result Value Range   Glucose-Capillary 114 (*) 70 - 99 mg/dL   Comment 1 Notify RN    GLUCOSE, CAPILLARY      Result Value Range   Glucose-Capillary 113 (*) 70 - 99 mg/dL   A/P:Pt with hx of progression of multiple myeloma. Plan is for CT guided bone marrow biopsy today. Details/risks of procedure d/w pt with his understanding and consent.

## 2013-07-31 NOTE — Progress Notes (Addendum)
[facility] PROGRESS NOTE  RUBEN MAHLER ZOX:[id number] DOB: 1941-05-26 DOA: 07/30/2013 PCP: Lemont Fillers., NP  Assessment/Plan: Left Arm cellulitis -Much improved today. -Continue IV antibiotics.  Multiple Myeloma -With disease progression. -Had bone marrow biopsy today. -Management as per oncology.  DM -Fair control.  Leukopenia/Thrombocytopenia -?related to revlimid. Doubt from his acute infection. -Follow counts.  Code Status: Full Code Family Communication: Wife and mother at bedside updated on plan of care.  Disposition Plan: Anticipate DC home in 1-2 days.   Consultants:  Oncology, Dr. Myna Hidalgo   Antibiotics:  Vanc  Aztreonam   Subjective: Feels like arm pain and swelling is improved.  Objective: Filed Vitals:   07/30/13 1229 07/30/13 2225 07/31/13 0517  BP: 156/55 115/55 116/63  Pulse: 112 93 92  Temp: 98.6 F (37 C) 99.5 F (37.5 C) 99.8 F (37.7 C)  TempSrc: Oral Oral Oral  Resp: 18 20 20   Height: 5\' 7"  (1.702 m)    Weight: 90.719 kg (200 lb)    SpO2:  96% 96%    Intake/Output Summary (Last 24 hours) at 07/31/13 1545 Last data filed at 07/31/13 1341  Gross per 24 hour  Intake   1694 ml  Output   4400 ml  Net  -2706 ml   Filed Weights   07/30/13 1229  Weight: 90.719 kg (200 lb)    Exam:   General:  AA Ox3  Cardiovascular: RRR  Respiratory: CTA B  Abdomen: S/NT/ND/+BS  Extremities: no C/C/E/+pulses. Decreased edema and erythema to left forearm.    Neurologic:  Non-focal  Data Reviewed: Basic Metabolic Panel:  Recent Labs Lab 07/25/13 1033 07/30/13 1450 07/31/13 0712  NA 136  --  134*  K 4.3  --  3.5  CL 103  --  101  CO2 29  --  25  GLUCOSE 151*  --  128*  BUN 21  --  10  CREATININE 0.5* 0.66 0.55  CALCIUM 8.8  --  8.1*   Liver Function Tests:  Recent Labs Lab 07/25/13 1033  AST 59*  ALT 166*  ALKPHOS 63  BILITOT 1.30  PROT 5.4*   No results found for this basename: LIPASE,  AMYLASE,  in the last 168 hours No results found for this basename: AMMONIA,  in the last 168 hours CBC:  Recent Labs Lab 07/25/13 1033 07/30/13 1450 07/31/13 0712  WBC 8.9 1.9* 1.7*  NEUTROABS 8.0*  --   --   HGB 12.9* 11.6* 10.3*  HCT 38.2* 33.2* 29.7*  MCV 98 95.4 95.2  PLT 64* 21* 26*   Cardiac Enzymes: No results found for this basename: CKTOTAL, CKMB, CKMBINDEX, TROPONINI,  in the last 168 hours BNP (last 3 results) No results found for this basename: PROBNP,  in the last 8760 hours CBG:  Recent Labs Lab 07/30/13 1716 07/30/13 2217 07/31/13 0744 07/31/13 1212  GLUCAP 166* 114* 113* 192*    No results found for this or any previous visit (from the past 240 hour(s)).   Studies: Ct Forearm Left W Contrast  07/30/2013   CLINICAL DATA:  Forearm abscess.  Cellulitis.  Fasciitis.  EXAM: CT OF THE LEFT FOREARM WITH CONTRAST  TECHNIQUE: Multidetector CT imaging was performed following the standard protocol during bolus administration of intravenous contrast.  CONTRAST:  OMNIPAQUE IOHEXOL 300 MG/ML  SOLN  COMPARISON:  None.  FINDINGS: Study is technically degraded. The arm had to be scanned over the patient's abdomen due to body habitus. There is no gas identified in the soft  tissues of the forearm. No discrete fluid collection is identified. Radius, ulna and humerus appear normal. The visualize carpal bones also appear normal.  IMPRESSION: Technically degraded study by positioning and body habitus. No gas within the soft tissues to suggest fasciitis. No abscess.   Electronically Signed   By: Andreas Newport M.D.   On: 07/30/2013 23:24   US Venous Img Upper Uni Left  07/30/2013   CLINICAL DATA:  Left forearm pain swelling and redness history of pulmonary embolus and previous inferior vena caval filter placement.  EXAM: Left UPPER EXTREMITY VENOUS DOPPLER ULTRASOUND  TECHNIQUE: Gray-scale sonography with graded compression, as well as color Doppler and duplex ultrasound were  performed to evaluate the upper extremity deep venous system from the level of the subclavian vein and including the jugular, axillary, basilic and upper cephalic vein. Spectral Doppler was utilized to evaluate flow at rest and with distal augmentation maneuvers.  COMPARISON:  None.  FINDINGS: Thrombus within deep veins:  None visualized.  Compressibility of deep veins:  Normal.  Duplex waveform respiratory phasicity:  Normal.  Duplex waveform response to augmentation:  Normal.  Venous reflux:  None visualized.  Other findings: There is mild superficial soft tissue edema over the forearm. Incidental note is made of possible nodules within the left thyroid lobe.  IMPRESSION: 1. There is no evidence of deep venous thrombosis within the left upper extremity. 2. There is subcutaneous edema. 3. There may be a nodule or nodules in the left thyroid lobe.   Electronically Signed   By: David  [place]   On: 07/30/2013 09:25   Ct Biopsy  07/31/2013   INDICATION: Evaluate for progression of multiple myeloma  EXAM: CT BIOPSY  MEDICATIONS: Fentanyl 100 mcg IV; Versed 2 mg IV  ANESTHESIA/SEDATION: Sedation Time  10 minutes  CONTRAST:  None  PROCEDURE: Informed consent was obtained from the patient following an explanation of the procedure, risks, benefits and alternatives. The patient understands, agrees and consents for the procedure. All questions were addressed. A time out was performed prior to the initiation of the procedure. The patient was positioned prone and noncontrast localization CT was performed of the pelvis to demonstrate the iliac marrow spaces. The operative site was prepped and draped in the usual sterile fashion.  Under sterile conditions and local anesthesia, an 11 gauge coaxial bone biopsy needle was advanced into the left iliac marrow space. Needle position was confirmed with CT imaging. Initially, bone marrow aspiration was performed. Next, a bone marrow biopsy was obtained with the 11 gauge outer bone  marrow device. Samples were prepared with the cytotechnologist and deemed adequate. The needle was removed intact. Hemostasis was obtained with compression and a dressing was placed. The patient tolerated the procedure well without immediate post procedural complication.  COMPLICATIONS: None immediate.  IMPRESSION: Successful CT guided left iliac bone marrow aspiration and core biopsy.   Electronically Signed   By: Simonne Come M.D.   On: 07/31/2013 10:49    Scheduled Meds: . aztreonam  1 g Intravenous Q8H  . baclofen  10 mg Oral TID  . diltiazem  120 mg Oral q morning - 10a  . fentaNYL      . insulin aspart  0-9 Units Subcutaneous TID WC  . insulin aspart  3 Units Subcutaneous TID WC  . midazolam      . pantoprazole  20 mg Oral Daily  . predniSONE  20 mg Oral Q breakfast  . vitamin B-6  250 mg Oral Daily  .  vancomycin  1,250 mg Intravenous Q12H   Continuous Infusions: . sodium chloride 75 mL/hr at 07/31/13 0940    Principal Problem:   Cellulitis of left arm Active Problems:   Multiple myeloma   Diabetes mellitus    Time spent: 35 minutes. Greater than 50% of this time was spent in direct contact with the patient coordinating care.    Chaya Jan  [facility] Pager [phone]  If 7PM-7AM, please contact night-coverage at [email or web address], password [email or web address] 07/31/2013, 3:45 PM  LOS: 1 day

## 2013-07-31 NOTE — Progress Notes (Signed)
[facility]    Radiation Oncology [address] [place]     Maryln Gottron, M.D. [place], [place] [address]               Billie Lade, M.D., Ph.D. Phone: [phone]      Molli Hazard A. Kathrynn Running, M.D. Fax: [phone]      Radene Gunning, M.D., Ph.D.         Lurline Hare, M.D.         Grayland Jack, M.D Weekly Treatment Management Note  Name: Robert Berger     MRN: [id number]        CSN: [id number] Date: 07/31/2013      DOB: 1941-04-13  CC: Lemont Fillers., NP         Peggyann Juba    Status: IN- Patient  Diagnosis: The encounter diagnosis was Multiple myeloma.  Current Dose: 12.5 Gy  Current Fraction: 5  Planned Dose: 25 Gy  Narrative: [place] Records was seen today for weekly treatment management. The chart was checked and port films  were reviewed. He is tolerating his treatments well at this time. He denies any nausea or bowel problems. His pain in the lower back and left leg area has improved. Patient is currently admitted for management of cellulitis involving the left arm.  Penicillins No current facility-administered medications for this encounter.   No current outpatient prescriptions on file.   Facility-Administered Medications Ordered in Other Encounters  Medication Dose Route Frequency Provider Last Rate Last Dose  . 0.9 %  sodium chloride infusion   Intravenous Continuous Henderson Cloud, MD 75 mL/hr at 07/31/13 0940    . acetaminophen (TYLENOL) tablet 650 mg  650 mg Oral Q6H PRN Henderson Cloud, MD       Or  . acetaminophen (TYLENOL) suppository 650 mg  650 mg Rectal Q6H PRN Henderson Cloud, MD      . aztreonam (AZACTAM) 1 g in dextrose 5 % 50 mL IVPB  1 g Intravenous Q8H Laurence Slate, RPH   1 g at 07/31/13 1323  . baclofen (LIORESAL) tablet 10 mg  10 mg Oral TID Henderson Cloud, MD   10 mg at 07/31/13 1643  . diltiazem (CARDIZEM CD) 24 hr capsule 120 mg  120 mg Oral q morning - 10a Estela Isaiah Blakes, MD   120 mg at 07/31/13 0941  . fentaNYL (SUBLIMAZE) 0.05 MG/ML injection           . HYDROmorphone (DILAUDID) injection 0.5 mg  0.5 mg Intravenous Q4H PRN Henderson Cloud, MD   0.5 mg at 07/31/13 1700  . insulin aspart (novoLOG) injection 0-9 Units  0-9 Units Subcutaneous TID WC Estela Isaiah Blakes, MD   4 Units at 07/31/13 1740  . insulin aspart (novoLOG) injection 3 Units  3 Units Subcutaneous TID WC Estela Isaiah Blakes, MD   3 Units at 07/31/13 1740  . midazolam (VERSED) 2 MG/2ML injection           . ondansetron (ZOFRAN) tablet 4 mg  4 mg Oral Q6H PRN Henderson Cloud, MD       Or  . ondansetron [facility]) injection 4 mg  4 mg Intravenous Q6H PRN Henderson Cloud, MD      . pantoprazole (PROTONIX) EC tablet 20 mg  20 mg Oral Daily Henderson Cloud, MD   20 mg at 07/31/13 0941  . predniSONE (DELTASONE)  tablet 20 mg  20 mg Oral Q breakfast Henderson Cloud, MD   20 mg at 07/31/13 1610  . pyridOXINE (VITAMIN B-6) tablet 250 mg  250 mg Oral Daily Henderson Cloud, MD   250 mg at 07/31/13 0941  . senna-docusate (Senokot-S) tablet 1 tablet  1 tablet Oral QHS PRN Henderson Cloud, MD      . sodium chloride (OCEAN) 0.65 % nasal spray 1 spray  1 spray Each Nare PRN [name], MD   1 spray at 07/31/13 [phone]  . vancomycin (VANCOCIN) 1,250 mg in sodium chloride 0.9 % 250 mL IVPB  1,250 mg Intravenous Q12H Laurence Slate, RPH   1,250 mg at 07/31/13 1643   Labs:  Lab Results  Component Value Date   WBC 1.7* 07/31/2013   HGB 10.3* 07/31/2013   HCT 29.7* 07/31/2013   MCV 95.2 07/31/2013   PLT 26* 07/31/2013   Lab Results  Component Value Date   CREATININE 0.55 07/31/2013   BUN 10 07/31/2013   NA 134* 07/31/2013   K 3.5 07/31/2013   CL 101 07/31/2013   CO2 25 07/31/2013   Lab Results  Component Value Date   ALT 166* 07/25/2013   AST 59* 07/25/2013   PHOS 2.3 05/22/2012   BILITOT 1.30 07/25/2013     Physical Examination:  There were no vitals filed for this visit.  Wt Readings from Last 3 Encounters:  07/30/13 200 lb (90.719 kg)  07/25/13 203 lb (92.08 kg)  07/24/13 202 lb 1.6 oz (91.672 kg)    Significant cellulitis involving the left forearm Lungs - Normal respiratory effort, chest expands symmetrically. Lungs are clear to auscultation, no crackles or wheezes.  Heart has regular rhythm and rate  Abdomen is soft and non tender with normal bowel sounds  Assessment:  Patient tolerating treatments well  Plan: Continue treatment per original radiation prescription

## 2013-07-31 NOTE — Care Management Note (Signed)
   CARE MANAGEMENT NOTE 07/31/2013  Patient:  Robert Berger, Robert Berger   Account Number:  [id number]  Date Initiated:  07/31/2013  Documentation initiated by:  Kerline Trahan  Subjective/Objective Assessment:   72 yo male admitted with Cellulitis of the left arm  and Multiple myeloma. PCP:   Lemont Fillers., NP . Oncologist: Dr. Myna Hidalgo     Action/Plan:   Home when stable   Anticipated DC Date:     Anticipated DC Plan:  HOME/SELF CARE      DC Planning Services  CM consult      Choice offered to / List presented to:  NA   DME arranged  NA      DME agency  NA     HH arranged  NA      HH agency  NA   Status of service:  In process, will continue to follow Medicare Important Message given?   (If response is "NO", the following Medicare IM given date fields will be blank) Date Medicare IM given:   Date Additional Medicare IM given:    Discharge Disposition:    Per UR Regulation:  Reviewed for med. necessity/level of care/duration of stay  If discussed at Long Length of Stay Meetings, dates discussed:    Comments:  07/31/13 1148 Robert Lupinacci,MSN,RN [phone] Chart reviewed for utilization of services. No needs identified at this time.

## 2013-07-31 NOTE — Progress Notes (Signed)
Pt asked to have SCDs removed. States he doesn't want to wear them. Pt educated on benefits and purpose of SCDs but still requested to have them removed.

## 2013-07-31 NOTE — Consult Note (Signed)
I will provide you a full consult note later. Ms. Trompeter is very well known to me. He is a 72 year old gentleman with myeloma. He has IgG kappa myeloma. He presented back in 2013.  Isn't responded to treatment. He recently has been on Revlimid. 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 This is despite his myeloma panel being much improved.  Is undergoing radiation therapy to the lumbosacral spine.  He's been having increasing pain in the left forearm. He had a Doppler in the office yesterday. This was negative for a DVT. However, I saw his left arm, it was red and swollen from the wrist up to the elbow. This appear to be a bad cellulitis or possibly even erysipelas. I felt that he needed to be admitted for IV antibiotics. The hospitalist were very gracious in admitting him.  He is on IV antibiotics. His left forearm looks better. There's not as much pain.  He had a CT scan done last night of the left forearm. There is no fasciitis or myositis changes. Bones looked okay.  He is due for a bone marrow biopsy today.  I noted that his blood counts were quite chains from just last week. He was leukopenic and thrombocytopenic. I don't know if this is from this infection that he has or if this is from the Revlimid.  He looks better. The forearm was better.  I would continue him on IV antibiotics right now.  Continue his radiation therapy.   I appreciate, again, the excellent care that he is getting from the hospitalist in from the staff on 3E!!  I will follow along very closely.  Pete E.  Duwayne Heck 7:14

## 2013-07-31 NOTE — Progress Notes (Signed)
Seen in back by Dr.Kinard, not sent to nursing for assessment 1:12 PM

## 2013-08-01 ENCOUNTER — Ambulatory Visit
Admission: RE | Admit: 2013-08-01 | Discharge: 2013-08-01 | Disposition: A | Discharge status: Home / Self Care | Payer: BC Managed Care – PPO | Source: Ambulatory Visit | Attending: Radiation Oncology | Admitting: Radiation Oncology

## 2013-08-01 DIAGNOSIS — K5900 Constipation, unspecified: Secondary | ICD-10-CM

## 2013-08-01 DIAGNOSIS — L0291 Cutaneous abscess, unspecified: Secondary | ICD-10-CM

## 2013-08-01 LAB — CBC WITH DIFFERENTIAL/PLATELET
Basophils Relative: 0 % (ref 0–1)
Eosinophils Relative: 4 % (ref 0–5)
Hemoglobin: 9.9 g/dL — ABNORMAL LOW (ref 13.0–17.0)
Lymphocytes Relative: 16 % (ref 12–46)
MCH: 32.4 pg (ref 26.0–34.0)
Monocytes Absolute: 0.2 10*3/uL (ref 0.1–1.0)
Monocytes Relative: 11 % (ref 3–12)
Neutro Abs: 0.9 10*3/uL — ABNORMAL LOW (ref 1.7–7.7)
Neutrophils Relative %: 69 % (ref 43–77)
RBC: 3.06 MIL/uL — ABNORMAL LOW (ref 4.22–5.81)
RDW: 14.4 % (ref 11.5–15.5)
WBC: 1.4 10*3/uL — CL (ref 4.0–10.5)

## 2013-08-01 LAB — GLUCOSE, CAPILLARY
Glucose-Capillary: 131 mg/dL — ABNORMAL HIGH (ref 70–99)
Glucose-Capillary: 169 mg/dL — ABNORMAL HIGH (ref 70–99)
Glucose-Capillary: 146 mg/dL — ABNORMAL HIGH (ref 70–99)
Glucose-Capillary: 131 mg/dL — ABNORMAL HIGH (ref 70–99)

## 2013-08-01 LAB — COMPREHENSIVE METABOLIC PANEL
ALT: 64 U/L — ABNORMAL HIGH (ref 0–53)
Albumin: 1.8 g/dL — ABNORMAL LOW (ref 3.5–5.2)
Alkaline Phosphatase: 106 U/L (ref 39–117)
BUN: 12 mg/dL (ref 6–23)
CO2: 24 mEq/L (ref 19–32)
Chloride: 104 mEq/L (ref 96–112)
GFR calc Af Amer: >90 mL/min (ref >90)
GFR calc non Af Amer: >90 mL/min (ref >90)
Glucose, Bld: 172 mg/dL — ABNORMAL HIGH (ref 70–99)
Potassium: 3.8 mEq/L (ref 3.5–5.1)
Total Bilirubin: 0.5 mg/dL (ref 0.3–1.2)
Total Protein: 4.8 g/dL — ABNORMAL LOW (ref 6.0–8.3)

## 2013-08-01 LAB — PROTEIN, URINE, 24 HOUR
Collection Interval-UPROT: 24 hours
Protein, 24H Urine: 816 mg/d — ABNORMAL HIGH (ref 50–100)
Protein, Urine: 16 mg/dL
Urine Total Volume-UPROT: 5100 mL

## 2013-08-01 LAB — VANCOMYCIN, TROUGH: Vancomycin Tr: 9.1 ug/mL — ABNORMAL LOW (ref 10.0–20.0)

## 2013-08-01 MED ORDER — GLUCERNA SHAKE PO LIQD
237.0000 mL | Freq: Two times a day (BID) | ORAL | Status: DC | PRN
  Filled 2013-08-01: qty 237

## 2013-08-01 MED ORDER — VANCOMYCIN HCL 10 G IV SOLR
1500.0000 mg | Freq: Two times a day (BID) | INTRAVENOUS | Status: DC
  Administered 2013-08-01 – 2013-08-03 (×4): 1500 mg via INTRAVENOUS
  Filled 2013-08-01 (×5): qty 1500

## 2013-08-01 MED ORDER — FILGRASTIM 480 MCG/1.6ML IJ SOLN
480.0000 ug | Freq: Every day | INTRAMUSCULAR | Status: AC
  Administered 2013-08-01 – 2013-08-03 (×3): 480 ug via SUBCUTANEOUS
  Filled 2013-08-01 (×5): qty 1.6

## 2013-08-01 NOTE — Progress Notes (Signed)
INITIAL NUTRITION ASSESSMENT  DOCUMENTATION CODES Per approved criteria  -Obesity Unspecified   INTERVENTION: Provide Glucerna Shakes BID PRN Encourage PO intake  NUTRITION DIAGNOSIS: Inadequate oral intake related to varied appetite as evidenced by PO intake ranging 35% to 100% of meals.   Goal: Pt to meet >/= 90% of their estimated nutrition needs   Monitor:  PO intake Weight Labs  Reason for Assessment: Consult (poor PO intake)  72 y.o. 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 He states that about 2 days ago he started noticing left arm pain redness and swelling that progressed to the point where he became concerned.   Pt reports that his appetite has been much better and he is eating more today. He states when he was first admitted he was in so much pain he was unable to eat much. Pt reports that PTA his appetite would vary and he drinks Ensure supplements as needed at home. He has been able to maintain his weight around 200 lbs for the past few months.  Pt states that most of the time his blood glucose is under 100 mg/dL at home. Encourage pt to switch to Glucerna Shakes if blood glucose is running high.   Lab Results  Component Value Date   HGBA1C 6.3* 07/30/2013     Height: Ht Readings from Last 1 Encounters:  07/30/13 5\' 7"  (1.702 m)    Weight: Wt Readings from Last 1 Encounters:  07/30/13 200 lb (90.719 kg)    Ideal Body Weight: 148 lbs  % Ideal Body Weight: 135%  Wt Readings from Last 10 Encounters:  07/30/13 200 lb (90.719 kg)  07/25/13 203 lb (92.08 kg)  07/24/13 202 lb 1.6 oz (91.672 kg)  07/12/13 200 lb 1.6 oz (90.765 kg)  07/06/13 203 lb (92.08 kg)   06/21/13 199 lb (90.266 kg)  05/25/13 198 lb 1.3 oz (89.848 kg)  05/14/13 195 lb (88.451 kg)  05/02/13 194 lb 8 oz (88.225 kg)  04/18/13 196 lb (88.905 kg)  Pt reports weighing 270 lbs 18 months ago  Usual Body Weight: 200 lbs  % Usual Body Weight: 100%  BMI:  Body mass index is 31.32 kg/(m^2).  Estimated Nutritional Needs: Kcal: 2100-2300 Protein: 110-120 grams Fluid: 2.1-2.3 L/day  Skin: non-pitting RLE and LLE edema; cellulitis of left forearm  Diet Order: General  EDUCATION NEEDS: -No education needs identified at this time   Intake/Output Summary (Last 24 hours) at 08/01/13 1149 Last data filed at 08/01/13 0949  Gross per 24 hour  Intake    840 ml  Output   1900 ml  Net  -1060 ml    Last BM: 12/24   Labs:   Recent Labs Lab 07/30/13 1450 07/31/13 0712 08/01/13 0450  NA  --  134* 135  K  --  3.5 3.8  CL  --  101 104  CO2  --  25 24  BUN  --  10 12  CREATININE 0.66 0.55 0.61  CALCIUM  --  8.1* 8.0*  GLUCOSE  --  128* 172*    CBG (last 3)   Recent Labs  07/31/13 2236 08/01/13 0733 08/01/13 1145  GLUCAP 177* 131* 169*    Scheduled Meds: . aztreonam  1 g  Intravenous Q8H  . baclofen  10 mg Oral TID  . diltiazem  120 mg Oral q morning - 10a  . filgrastim  480 mcg Subcutaneous Daily  . insulin aspart  0-9 Units Subcutaneous TID WC  . insulin aspart  3 Units Subcutaneous TID WC  . pantoprazole  20 mg Oral Daily  . predniSONE  20 mg Oral Q breakfast  . vitamin B-6  250 mg Oral Daily  . vancomycin  1,250 mg Intravenous Q12H    Continuous Infusions: . sodium chloride 75 mL/hr at 08/01/13 0020    Past Medical History  Diagnosis Date  . Arthritis   . GERD (gastroesophageal reflux disease)   . Complication of anesthesia     aspiration during surgery, difficult to wake up  . Multiple myeloma   . Status post radiation therapy 03/23/12 - 04/05/12    T8 - L4/ 20 Gy/ 10 Fractions  . Dysphagia 05/06/12    ED Visit    Past Surgical History   Procedure Laterality Date  . Hemorrhoid surgery    . Laparotomy  05/14/2012    Procedure: EXPLORATORY LAPAROTOMY;  Surgeon: Velora Heckler, MD;  Location: [facility];  Service: General;  Laterality: N/A;  . Colostomy  05/14/2012    Procedure: COLOSTOMY;  Surgeon: Velora Heckler, MD;  Location: [facility];  Service: General;  Laterality: N/A;  . Back surgery      47 years ago  . Fracture surgery      ankle    Ian Malkin RD, LDN Inpatient Clinical Dietitian Pager: [phone] After Hours Pager: [phone]

## 2013-08-01 NOTE — Progress Notes (Signed)
Patient ID: Robert Berger  male  ZOX:[id number]    DOB: Mar 26, 1941    DOA: 07/30/2013  PCP: Lemont Fillers., NP  Assessment/Plan:  Left Arm cellulitis  -Improving, continue IV antibiotics.   Multiple Myeloma  -With disease progression.  -Had bone marrow biopsy today.  -Management as per oncology.   DM  -Fair control.   Leukopenia/Thrombocytopenia  -?related to revlimid. Doubt from his acute infection.  -Follow counts.  - Oncology following  DVT Prophylaxis:  Code Status: Full code  Family Communication:  Disposition:    Subjective: Left arm feels still tight, warmth and redness is improving  Objective: Weight change:   Intake/Output Summary (Last 24 hours) at 08/01/13 1311 Last data filed at 08/01/13 0949  Gross per 24 hour  Intake    840 ml  Output   1450 ml  Net   -610 ml   Blood pressure 110/70, pulse 83, temperature 98.2 F (36.8 C), temperature source Oral, resp. rate 20, height 5\' 7"  (1.702 m), weight 90.719 kg (200 lb), SpO2 98.00%.  Physical Exam: General: Alert and awake, oriented x3, not in any acute distress. CVS: S1-S2 clear, no murmur rubs or gallops Chest: clear to auscultation bilaterally, no wheezing, rales or rhonchi Abdomen: soft nontender, nondistended, normal bowel sounds  Extremities: Left forearm still edema with erythema, warm Neuro: Cranial nerves II-XII intact, no focal neurological deficits  Lab Results: Basic Metabolic Panel:  Recent Labs Lab 07/31/13 0712 08/01/13 0450  NA 134* 135  K 3.5 3.8  CL 101 104  CO2 25 24  GLUCOSE 128* 172*  BUN 10 12  CREATININE 0.55 0.61  CALCIUM 8.1* 8.0*   Liver Function Tests:  Recent Labs Lab 08/01/13 0450  AST 23  ALT 64*  ALKPHOS 106  BILITOT 0.5  PROT 4.8*  ALBUMIN 1.8*   No results found for this basename: LIPASE, AMYLASE,  in the last 168 hours No results found for this basename: AMMONIA,  in the last 168 hours CBC:  Recent Labs Lab 07/31/13 0712  08/01/13 0450  WBC 1.7* 1.4*  NEUTROABS  --  0.9*  HGB 10.3* 9.9*  HCT 29.7* 29.3*  MCV 95.2 95.8  PLT 26* 23*   Cardiac Enzymes: No results found for this basename: CKTOTAL, CKMB, CKMBINDEX, TROPONINI,  in the last 168 hours BNP: No components found with this basename: POCBNP,  CBG:  Recent Labs Lab 07/31/13 1212 07/31/13 1700 07/31/13 2236 08/01/13 0733 08/01/13 1145  GLUCAP 192* 141* 177* 131* 169*     Micro Results: No results found for this or any previous visit (from the past 240 hour(s)).  Studies/Results: Dg Forearm Left  07/26/2013   CLINICAL DATA:  History of multiple myeloma and left arm pain. Evaluate for lesion or fracture.  EXAM: LEFT FOREARM - 2 VIEW  COMPARISON:  Left elbow 04/25/2012  FINDINGS: Two views of the forearm were obtained. No evidence for fracture or dislocation. No suspicious bony lesions are identified. Small calcifications or densities in the soft tissues.  IMPRESSION: No acute bone abnormality in the left forearm.   Electronically Signed   By: Richarda Overlie M.D.   On: 07/26/2013 13:58   Mr Lumbar Spine Wo Contrast  07/07/2013   CLINICAL DATA:  72 year old male with multiple myeloma. Severe right side low back pain. Left side pain radiating to the lower extremity. Initial encounter.  EXAM: MRI LUMBAR SPINE WITHOUT CONTRAST  TECHNIQUE: Multiplanar, multisequence MR imaging was performed. No intravenous contrast was administered.  COMPARISON:  CT  Abdomen and Pelvis 03/19/2013. Lumbar MRI 12/20/2012.  FINDINGS: Same numbering system as on the comparisons.  Sequelae of T12 pathologic compression fracture status post augmentation and adjacent T11 vertebral body augmentation appear stable since 12/20/2012. Partially visible T10 level, also was seen to be augmented in August. Visualized lower thoracic spinal cord is normal with conus medularis at L1. There is a chronic right paracentral disc protrusion at T12-L1, stable since May.  Abnormal bone marrow signal  now throughout the lumbar spine, with heterogeneous loss of the normal fatty marrow signal which predominated in May.  The L1 and L2 levels remain intact and demonstrate no epidural or foraminal tumor. Chronic L2-L3 central disc protrusion has not significantly changed. No significant spinal stenosis.  The L3 level has been augmented, and appears stable since August. The posterior L3 vertebral body tumor appears mildly progressed in overall size (now up to 19 mm diameter, 15 mm in May. No epidural or extraosseous tumor at this level. Central disc protrusion at L3 appears stable (series 5, image 24). Mild left lateral recess stenosis as before.  The L4 vertebra remains intact. Central L4 disc protrusion superimposed on facet hypertrophy with bilateral lateral recess stenosis is stable. No significant spinal stenosis. No epidural or extraosseous tumor at this level.  The L5 level is now almost completely replaced by tumor. Still, no epidural or foraminal disease is identified. Stable epidural lipomatosis. Stable superimposed L5-S1 facet degeneration.  New expansile tumor centered at the lower S1/S1 S2 level, has eroded through the posterior vertebral body and extends in the epidural space to the left of midline. The exiting left S1 nerve is engulfed by tumor (series 7, image 39). The descending left S2 nerve roots are engulfed (image 38). No sacral pathologic fracture identified.  There is now diffusely abnormal marrow in the visible medial iliac bones.  IMPRESSION: 1. Progressed multiple myeloma in the L5 vertebra and sacrum. Bulky central sacral tumor which has eroded the posterior vertebral body cortex and tracks in the sacral epidural space engulfing the exiting left S1 and descending S2 nerve roots. 2. Progressed myelomatous involvement of the remaining lumbar levels, but with a miliary pattern of osseous involvement and no extraosseous tumor. Stable L3 vertebral body augmentation since August. 3. Stable  pathologic fractures and augmentation in the visible lower thoracic spine. 4. Superimposed lumbar spine degeneration with multilevel disc herniations, not significantly changed.  These results will be called to the ordering clinician or representative by the Radiologist Assistant, and communication documented in the PACS Dashboard.   Electronically Signed   By: Augusto Gamble M.D.   On: 07/07/2013 17:32   Mr Cervical Spine W Wo Contrast  07/27/2013   CLINICAL DATA:  Multiple myeloma.  Severe left arm pain.  EXAM: MRI CERVICAL SPINE WITHOUT AND WITH CONTRAST  TECHNIQUE: Multiplanar and multiecho pulse sequences of the cervical spine, to include the craniocervical junction and cervicothoracic junction, were obtained according to standard protocol without and with intravenous contrast.  CONTRAST:  19 ml MultiHance.  COMPARISON:  Cervical spine radiographs 10/30/2012. Cervical MRI 03/15/2012.  FINDINGS: Compared with the prior examination, there has slightly progressive heterogeneity of marrow signal and enhancement, most consistent with diffuse myelomatous involvement. There is no evidence of epidural tumor or pathologic fracture.  The craniocervical junction appears normal. The cervical cord is normal in signal and caliber. There is no abnormal intradural enhancement. Bilateral vertebral artery flow voids are present.  Asymmetric enlargement of the left thyroid lobe is again noted, without substantial change.  There is stable relatively mild spondylosis with posterior osteophytes and uncinate spurring at C5-6 and C6-7. The resulting biforaminal stenosis, greater at C5-6, is unchanged. There is no cord deformity.  IMPRESSION: 1. Minimally progressive marrow heterogeneity throughout the cervical spine consistent with multiple myeloma. No pathologic fracture or epidural tumor demonstrated. 2. Stable cervical spondylosis with uncinate spurring, greatest at C5-6 where there is stable moderate biforaminal stenosis. This  could contribute to chronic exiting nerve root encroachment. No acute findings are identified.   Electronically Signed   By: Roxy Horseman M.D.   On: 07/27/2013 11:57   Mr Thoracic Spine W Wo Contrast  07/27/2013   CLINICAL DATA:  Multiple myeloma.  Right-sided back pain.  EXAM: MRI THORACIC SPINE WITHOUT AND WITH CONTRAST  TECHNIQUE: Multiplanar and multiecho pulse sequences of the thoracic spine were obtained without and with intravenous contrast.  CONTRAST:  19mL MULTIHANCE GADOBENATE DIMEGLUMINE 529 MG/ML IV SOLN  COMPARISON:  None.  FINDINGS: Flowing osteophytes fuse the thoracic spine from T3 through T11. Does the patient have a history of ankylosing spondylitis? The disc spaces are essentially gone from T5-6 through T10-11.  There is evidence of multiple lesions in the thoracic spine including the T2, T5, T6, T9, T10, T11, and in the lumbar spine at the L1 and L2 and L3 vertebra. There are also lesions in the right 2nd and 7th ribs posteriorly and in the left 5th rib.  There is an old pathologic fracture of T12. The T9 through T12 vertebra have been treated with vertebroplasty.  The thoracic spinal cord appears normal. There is no compression of the thecal sac. There are no new pathologic fractures. Paraspinal soft tissues appear normal.  IMPRESSION: 1. Numerous lesions consistent with myeloma in the thoracic spine and ribs. 2. No compression of the thoracic spinal cord or of the nerves in the thoracic spine.   Electronically Signed   By: Geanie Cooley M.D.   On: 07/27/2013 12:12   Ct Forearm Left W Contrast  07/30/2013   CLINICAL DATA:  Forearm abscess.  Cellulitis.  Fasciitis.  EXAM: CT OF THE LEFT FOREARM WITH CONTRAST  TECHNIQUE: Multidetector CT imaging was performed following the standard protocol during bolus administration of intravenous contrast.  CONTRAST:  OMNIPAQUE IOHEXOL 300 MG/ML  SOLN  COMPARISON:  None.  FINDINGS: Study is technically degraded. The arm had to be scanned over the  patient's abdomen due to body habitus. There is no gas identified in the soft tissues of the forearm. No discrete fluid collection is identified. Radius, ulna and humerus appear normal. The visualize carpal bones also appear normal.  IMPRESSION: Technically degraded study by positioning and body habitus. No gas within the soft tissues to suggest fasciitis. No abscess.   Electronically Signed   By: Andreas Newport M.D.   On: 07/30/2013 23:24   US Venous Img Upper Uni Left  07/30/2013   CLINICAL DATA:  Left forearm pain swelling and redness history of pulmonary embolus and previous inferior vena caval filter placement.  EXAM: Left UPPER EXTREMITY VENOUS DOPPLER ULTRASOUND  TECHNIQUE: Gray-scale sonography with graded compression, as well as color Doppler and duplex ultrasound were performed to evaluate the upper extremity deep venous system from the level of the subclavian vein and including the jugular, axillary, basilic and upper cephalic vein. Spectral Doppler was utilized to evaluate flow at rest and with distal augmentation maneuvers.  COMPARISON:  None.  FINDINGS: Thrombus within deep veins:  None visualized.  Compressibility of deep veins:  Normal.  Duplex waveform respiratory phasicity:  Normal.  Duplex waveform response to augmentation:  Normal.  Venous reflux:  None visualized.  Other findings: There is mild superficial soft tissue edema over the forearm. Incidental note is made of possible nodules within the left thyroid lobe.  IMPRESSION: 1. There is no evidence of deep venous thrombosis within the left upper extremity. 2. There is subcutaneous edema. 3. There may be a nodule or nodules in the left thyroid lobe.   Electronically Signed   By: David  [place]   On: 07/30/2013 09:25   Ct Biopsy  07/31/2013   INDICATION: Evaluate for progression of multiple myeloma  EXAM: CT BIOPSY  MEDICATIONS: Fentanyl 100 mcg IV; Versed 2 mg IV  ANESTHESIA/SEDATION: Sedation Time  10 minutes  CONTRAST:  None   PROCEDURE: Informed consent was obtained from the patient following an explanation of the procedure, risks, benefits and alternatives. The patient understands, agrees and consents for the procedure. All questions were addressed. A time out was performed prior to the initiation of the procedure. The patient was positioned prone and noncontrast localization CT was performed of the pelvis to demonstrate the iliac marrow spaces. The operative site was prepped and draped in the usual sterile fashion.  Under sterile conditions and local anesthesia, an 11 gauge coaxial bone biopsy needle was advanced into the left iliac marrow space. Needle position was confirmed with CT imaging. Initially, bone marrow aspiration was performed. Next, a bone marrow biopsy was obtained with the 11 gauge outer bone marrow device. Samples were prepared with the cytotechnologist and deemed adequate. The needle was removed intact. Hemostasis was obtained with compression and a dressing was placed. The patient tolerated the procedure well without immediate post procedural complication.  COMPLICATIONS: None immediate.  IMPRESSION: Successful CT guided left iliac bone marrow aspiration and core biopsy.   Electronically Signed   By: Simonne Come M.D.   On: 07/31/2013 10:49    Medications: Scheduled Meds: . aztreonam  1 g Intravenous Q8H  . baclofen  10 mg Oral TID  . diltiazem  120 mg Oral q morning - 10a  . filgrastim  480 mcg Subcutaneous Daily  . insulin aspart  0-9 Units Subcutaneous TID WC  . insulin aspart  3 Units Subcutaneous TID WC  . pantoprazole  20 mg Oral Daily  . predniSONE  20 mg Oral Q breakfast  . vitamin B-6  250 mg Oral Daily  . vancomycin  1,250 mg Intravenous Q12H      LOS: 2 days   RAI,RIPUDEEP M.D. [facility] 08/01/2013, 1:11 PM Pager: [phone]  If 7PM-7AM, please contact night-coverage [email or web address] Password [email or web address]

## 2013-08-01 NOTE — Clinical Documentation Improvement (Signed)
Possible Clinical Conditions?   ____________Pancytopenia                          Due to other drug induced                          Due to antineoplastic drug induced                                Other Condition___________________                 Cannot Clinically Determine_________   Supporting InformationRecent Labs  Lab  07/25/13 1033  07/30/13 1450  07/31/13 0712   WBC  8.9  1.9*  1.7*   NEUTROABS  8.0*  --  --   HGB  12.9*  11.6*  10.3*   HCT  38.2*  33.2*  29.7*   MCV  98  95.4  95.2   PLT  64*  21*  26*    Risk Factors:Multiple myeloma  "continuing radiation therapy"  Treatment: Neupogen subq daily   Thank Barrie Dunker ,RN Clinical Documentation Specialist:  [phone]  [facility]- Health Information Management

## 2013-08-01 NOTE — Progress Notes (Signed)
ANTIBIOTIC CONSULT NOTE - Follow Up  Pharmacy Consult for vancomycin, aztreonam Indication: cellulitis  Allergies  Allergen Reactions  . Penicillins Nausea And Vomiting, Swelling and Rash    Patient Measurements: Height: 5\' 7"  (170.2 cm) Weight: 200 lb (90.719 kg) IBW/kg (Calculated) : 66.1 Adjusted Body Weight: 76kg  Vital Signs: Temp: 98.2 F (36.8 C) (12/24 0515) Temp src: Oral (12/24 0515) BP: 110/70 mmHg (12/24 0933) Pulse Rate: 83 (12/24 0515)  Medical History: Past Medical History  Diagnosis Date  . Arthritis   . GERD (gastroesophageal reflux disease)   . Complication of anesthesia     aspiration during surgery, difficult to wake up  . Multiple myeloma   . Status post radiation therapy 03/23/12 - 04/05/12    T8 - L4/ 20 Gy/ 10 Fractions  . Dysphagia 05/06/12    ED Visit    Medications:  Scheduled:  . aztreonam  1 g Intravenous Q8H  . baclofen  10 mg Oral TID  . diltiazem  120 mg Oral q morning - 10a  . filgrastim  480 mcg Subcutaneous Daily  . insulin aspart  0-9 Units Subcutaneous TID WC  . insulin aspart  3 Units Subcutaneous TID WC  . pantoprazole  20 mg Oral Daily  . predniSONE  20 mg Oral Q breakfast  . vitamin B-6  250 mg Oral Daily  . vancomycin  1,250 mg Intravenous Q12H   Infusions:  . sodium chloride 75 mL/hr at 08/01/13 0020   Assessment: 72 yoM w Hx multiple myeloma admitted 12/22 with cellulitis starting 2 days PTA directly admitted from MedOnc's office for IV antibiotics. Pt on lenalidomide 15mg  daily x 3 weeks, then 1 week off, LD on 12/20, to resume 15mg  daily on 12/28. Patient also with Hx DM. Pharmacy has been consulted  On 12/22 to dose vancomycin and aztreonam for cellulitis in this diabetic patient with an allergy to penicillins.  Antiinfectives: 12/22 >> vancomycin >> 12/22 >> aztreonam >>   Tmax: afebrile WBCs: 1.4K Renal: SCr 0.61, CrCl 90 (using adj wt=76kg and SCr=0.8), 85N (using SCr= 0.8)  Microbiology: 12/22 blood x2:  ordered but never drawn  12/24: per notes, patient's cellulitis improving - ? Change to PO antibiotics soon   Goal of Therapy:  Vancomycin trough level 10-15 mcg/ml eradication of infection  Plan:  1) Continue vancomycin 1250mg  IV q12 for now 2) Check vanc trough today prior to 5th dose at 4pm. 3) Continue current Aztreonam dosing   Hessie Knows, PharmD, BCPS Pager [phone] 08/01/2013 9:40 AM

## 2013-08-01 NOTE — Progress Notes (Signed)
Robert Berger is improving with his left forearm cellulitis. He continues on aztreonam and vancomycin.Marland Kitchen He had his bone marrow test yesterday. Results probably would not be available until Friday.  His left forearm pain is improving.  Is continuing radiation therapy.  The bed is very uncomfortable for him. I will try a mattress overlay for him.  I still am not sure why his leukopenia and thrombocytopenia persists. Again I will note this might be from the Revlimid that he was on. I think I need to get him on  Neupogen to try to help with the leukopenia.  A 24-hour urine does not show a lot of Bence Jones protein.  It is still hard understand that his myeloma studies are improving nicely but yet we have the progressive bone disease. I cannot imagine him having another problem. 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  His vital signs stable. Blood pressure 126/66. His lungs are clear. Cardiac exam regular rate and rhythm with no murmurs rubs or bruits. Abdomen is soft. 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 His left forearm is not nearly as erythematous. It is not nearly as swollen. It's not as tender to touch.  It looks like the cellulitis is improving. Again I'm not sure as to why the leukopenia and thrombocytopenia or so prominent. The bone marrow this would help [place] out.  We will continue his radiation.  I do think that he needs physical therapy while he is in the hospital.  I do not see that he is able to be discharged until we see how his blood counts improve.  I very much appreciate the outstanding care that he is being given by the hospitalist. The staff on [address] is also doing a great job with him!!!  Max Fickle 2:12

## 2013-08-01 NOTE — Progress Notes (Signed)
Pharmacy Consult: Vancomycin, Aztreonam Indication: Cellulitis  Please refer to notes written earlier today by Berkley Harvey, [facility] for more details. Vancomycin trough this afternoon = 9.1 (goal 10-15 mcg/ml) on Vancomycin 1250 mg IV q12h. Scr fairly stable. Improvement noted per MD notes.  Plan: Increase Vancomycin to 1500 mg IV q12h. Pharmacy will f/u  [name], PharmD, BCPS Pager: [phone] 5:40 PM Pharmacy #: [id number]

## 2013-08-01 NOTE — Progress Notes (Signed)
CRITICAL VALUE ALERT  Critical value received: PLT 23, WBC1 Date of notification: 08/01/13  Time of notification:  0530  Critical value read back:YES  Nurse who received alert:Ja Ohman,Meloni Hinz ,RN  MD notified (1st page):K .[facility]  Time of first page:  0530  MD notified (2nd page):  Time of second page:  Responding MD:  Merdis Delay  Time MD responded:  [phone]

## 2013-08-01 NOTE — Evaluation (Signed)
Physical Therapy Evaluation Patient Details Name: Robert Berger MRN: [id number] DOB: 01-Nov-1940 Today's Date: 08/01/2013 Time: 7829-5621 PT Time Calculation (min): 20 min  PT Assessment / Plan / Recommendation History of Present Illness  LUE pain, cellulitis.  Clinical Impression  Pt was able to ambulate x 50' with RW, tolerated much better than anticipated.. Pt will benefit from PT to address problems listed .     PT Assessment  Patient needs continued PT services    Follow Up Recommendations  Home health PT (pt may decline the need.)    Does the patient have the potential to tolerate intense rehabilitation      Barriers to Discharge        Equipment Recommendations  None recommended by PT    Recommendations for Other Services OT consult   Frequency Min 3X/week    Precautions / Restrictions Precautions Precautions: Fall   Pertinent Vitals/Pain L UE ,       Mobility  Bed Mobility Bed Mobility: Supine to Sit Supine to Sit: 4: Min guard Details for Bed Mobility Assistance: extra time for getting trunk upright, pt wanted no assistance. Transfers Transfers: Sit to Stand;Stand to Sit Sit to Stand: 4: Min assist Stand to Sit: 4: Min assist Details for Transfer Assistance: cues for safety and UE use, raCHING BACK TO SIT DOWN. Ambulation/Gait Ambulation/Gait Assistance: 4: Min assist Ambulation Distance (Feet): 50 Feet Assistive device: Rolling walker Ambulation/Gait Assistance Details: cues for safe sue of RW. Gait Pattern: Step-through pattern;Trunk flexed;Shuffle    Exercises     PT Diagnosis: Difficulty walking;Generalized weakness;Acute pain  PT Problem List: Decreased strength;Decreased activity tolerance;Decreased balance;Decreased mobility;Decreased knowledge of use of DME;Decreased safety awareness;Decreased knowledge of precautions;Pain;Impaired sensation PT Treatment Interventions: DME instruction;Gait training;Stair training;Functional mobility  training;Therapeutic activities     PT Goals(Current goals can be found in the care plan section) Acute Rehab PT Goals Patient Stated Goal: to walk. PT Goal Formulation: With patient/family Time For Goal Achievement: 08/15/13 Potential to Achieve Goals: Good  Visit Information  Last PT Received On: 08/01/13 Assistance Needed: +1 History of Present Illness: LUE pain, cellulitis.       Prior Functioning  Home Living Family/patient expects to be discharged to:: Private residence Living Arrangements: Spouse/significant other Available Help at Discharge: Family Type of Home: House Home Access: Stairs to enter Secretary/administrator of Steps: 2 Entrance Stairs-Rails: None Home Layout: One level Home Equipment: Environmental consultant - 2 wheels;Wheelchair - manual;Bedside commode (lift chair.) Additional Comments: Patient is a 72 year old white man with history of multiple myeloma with recent progression with metastatic lesions to his lumbar spine for which he is currently receiving radiation therapy, diabetes mellitus, he also wears a colostomy bag for a prior episode of diverticulitis with perforation. He states that about 2 days ago he started noticing left arm pain redness and swelling that progressed to the point where he became concerned. He called Dr. Myna Hidalgo scheduled him today for an ultrasound to make sure he did not have a DVT. Ultrasound was negative, however when Dr. Myna Hidalgo examined his arm he decided to directly admit him to the hospital for IV antibiotics. He denies fevers, chills. We have been asked to admit him for further evaluation and management Prior Function Level of Independence: Needs assistance Gait / Transfers Assistance Needed: ambulatory with cane.    Cognition  Cognition Arousal/Alertness: Awake/alert Behavior During Therapy: WFL for tasks assessed/performed Overall Cognitive Status: Within Functional Limits for tasks assessed    Extremity/Trunk Assessment Upper  Extremity Assessment Upper  Extremity Assessment: Generalized weakness Lower Extremity Assessment Lower Extremity Assessment: Generalized weakness;LLE deficits/detail;RLE deficits/detail RLE Sensation: history of peripheral neuropathy LLE Sensation: history of peripheral neuropathy   Balance    End of Session PT - End of Session Equipment Utilized During Treatment: Gait belt Activity Tolerance: Patient tolerated treatment well;Patient limited by fatigue Patient left: in chair;with call bell/phone within reach;with family/visitor present Nurse Communication: Mobility status  GP     Rada Hay 08/01/2013, 4:03 PM Blanchard Kelch PT [phone]

## 2013-08-02 DIAGNOSIS — D61818 Other pancytopenia: Secondary | ICD-10-CM

## 2013-08-02 LAB — COMPREHENSIVE METABOLIC PANEL
ALT: 64 U/L — ABNORMAL HIGH (ref 0–53)
AST: 22 U/L (ref 0–37)
Albumin: 1.9 g/dL — ABNORMAL LOW (ref 3.5–5.2)
CO2: 22 mEq/L (ref 19–32)
Calcium: 8.4 mg/dL (ref 8.4–10.5)
Chloride: 105 mEq/L (ref 96–112)
Creatinine, Ser: 0.56 mg/dL (ref 0.50–1.35)
Total Bilirubin: 0.5 mg/dL (ref 0.3–1.2)
Total Protein: 5.2 g/dL — ABNORMAL LOW (ref 6.0–8.3)

## 2013-08-02 LAB — CBC
HCT: 29.7 % — ABNORMAL LOW (ref 39.0–52.0)
MCHC: 34 g/dL (ref 30.0–36.0)
Platelets: 38 10*3/uL — ABNORMAL LOW (ref 150–400)
RBC: 3.12 MIL/uL — ABNORMAL LOW (ref 4.22–5.81)
RDW: 14.4 % (ref 11.5–15.5)
WBC: 3.4 10*3/uL — ABNORMAL LOW (ref 4.0–10.5)

## 2013-08-02 LAB — GLUCOSE, CAPILLARY
Glucose-Capillary: 163 mg/dL — ABNORMAL HIGH (ref 70–99)
Glucose-Capillary: 143 mg/dL — ABNORMAL HIGH (ref 70–99)
Glucose-Capillary: 154 mg/dL — ABNORMAL HIGH (ref 70–99)

## 2013-08-02 MED ORDER — HYDROMORPHONE HCL PF 1 MG/ML IJ SOLN
0.5000 mg | Freq: Once | INTRAMUSCULAR | Status: AC
  Administered 2013-08-02: 0.5 mg via INTRAVENOUS
  Filled 2013-08-02: qty 1

## 2013-08-02 NOTE — Progress Notes (Signed)
Patient ID: Robert Berger  male  WUJ:[id number]    DOB: 27-Apr-1941    DOA: 07/30/2013  PCP: Lemont Fillers., NP  Assessment/Plan:  Left Arm cellulitis  -Improving, continue IV antibiotics.   Multiple Myeloma  -With disease progression.  -Had bone marrow biopsy 12/23.  -Management as per oncology.   DM  -Fair control.   Leukopenia/Thrombocytopenia  -?related to revlimid. Doubt from his acute infection.  -Follow counts.  - Oncology following -improving with neulasta.  DVT Prophylaxis:  Code Status: Full code  Family Communication: patient only  Disposition: Home in 24-48 hours as long as counts continue to trend up.    Subjective: Left arm feels still tight, warmth and redness is improving  Objective: Weight change:   Intake/Output Summary (Last 24 hours) at 08/02/13 1718 Last data filed at 08/02/13 1300  Gross per 24 hour  Intake    480 ml  Output   2925 ml  Net  -2445 ml   Blood pressure 130/70, pulse 78, temperature 98.5 F (36.9 C), temperature source Oral, resp. rate 18, height 5\' 7"  (1.702 m), weight 90.719 kg (200 lb), SpO2 97.00%.  Physical Exam: General: Alert and awake, oriented x3, not in any acute distress. CVS: S1-S2 clear, no murmur rubs or gallops Chest: clear to auscultation bilaterally, no wheezing, rales or rhonchi Abdomen: soft nontender, nondistended, normal bowel sounds  Extremities: Left forearm still edema with erythema, warm Neuro: Cranial nerves II-XII intact, no focal neurological deficits  Lab Results: Basic Metabolic Panel:  Recent Labs Lab 08/01/13 0450 08/02/13 0410  NA 135 137  K 3.8 3.3*  CL 104 105  CO2 24 22  GLUCOSE 172* 115*  BUN 12 13  CREATININE 0.61 0.56  CALCIUM 8.0* 8.4   Liver Function Tests:  Recent Labs Lab 08/01/13 0450 08/02/13 0410  AST 23 22  ALT 64* 64*  ALKPHOS 106 114  BILITOT 0.5 0.5  PROT 4.8* 5.2*  ALBUMIN 1.8* 1.9*   No results found for this basename: LIPASE, AMYLASE,   in the last 168 hours No results found for this basename: AMMONIA,  in the last 168 hours CBC:  Recent Labs Lab 08/01/13 0450 08/02/13 0815  WBC 1.4* 3.4*  NEUTROABS 0.9*  --   HGB 9.9* 10.1*  HCT 29.3* 29.7*  MCV 95.8 95.2  PLT 23* 38*   Cardiac Enzymes: No results found for this basename: CKTOTAL, CKMB, CKMBINDEX, TROPONINI,  in the last 168 hours BNP: No components found with this basename: POCBNP,  CBG:  Recent Labs Lab 08/01/13 1717 08/01/13 2109 08/02/13 0758 08/02/13 1151 08/02/13 1712  GLUCAP 146* 131* 107* 163* 143*     Micro Results: No results found for this or any previous visit (from the past 240 hour(s)).  Studies/Results: Dg Forearm Left  07/26/2013   CLINICAL DATA:  History of multiple myeloma and left arm pain. Evaluate for lesion or fracture.  EXAM: LEFT FOREARM - 2 VIEW  COMPARISON:  Left elbow 04/25/2012  FINDINGS: Two views of the forearm were obtained. No evidence for fracture or dislocation. No suspicious bony lesions are identified. Small calcifications or densities in the soft tissues.  IMPRESSION: No acute bone abnormality in the left forearm.   Electronically Signed   By: Richarda Overlie M.D.   On: 07/26/2013 13:58   Mr Lumbar Spine Wo Contrast  07/07/2013   CLINICAL DATA:  72 year old male with multiple myeloma. Severe right side low back pain. Left side pain radiating to the lower extremity. Initial encounter.  EXAM: MRI LUMBAR SPINE WITHOUT CONTRAST  TECHNIQUE: Multiplanar, multisequence MR imaging was performed. No intravenous contrast was administered.  COMPARISON:  CT Abdomen and Pelvis 03/19/2013. Lumbar MRI 12/20/2012.  FINDINGS: Same numbering system as on the comparisons.  Sequelae of T12 pathologic compression fracture status post augmentation and adjacent T11 vertebral body augmentation appear stable since 12/20/2012. Partially visible T10 level, also was seen to be augmented in August. Visualized lower thoracic spinal cord is normal with  conus medularis at L1. There is a chronic right paracentral disc protrusion at T12-L1, stable since May.  Abnormal bone marrow signal now throughout the lumbar spine, with heterogeneous loss of the normal fatty marrow signal which predominated in May.  The L1 and L2 levels remain intact and demonstrate no epidural or foraminal tumor. Chronic L2-L3 central disc protrusion has not significantly changed. No significant spinal stenosis.  The L3 level has been augmented, and appears stable since August. The posterior L3 vertebral body tumor appears mildly progressed in overall size (now up to 19 mm diameter, 15 mm in May. No epidural or extraosseous tumor at this level. Central disc protrusion at L3 appears stable (series 5, image 24). Mild left lateral recess stenosis as before.  The L4 vertebra remains intact. Central L4 disc protrusion superimposed on facet hypertrophy with bilateral lateral recess stenosis is stable. No significant spinal stenosis. No epidural or extraosseous tumor at this level.  The L5 level is now almost completely replaced by tumor. Still, no epidural or foraminal disease is identified. Stable epidural lipomatosis. Stable superimposed L5-S1 facet degeneration.  New expansile tumor centered at the lower S1/S1 S2 level, has eroded through the posterior vertebral body and extends in the epidural space to the left of midline. The exiting left S1 nerve is engulfed by tumor (series 7, image 39). The descending left S2 nerve roots are engulfed (image 38). No sacral pathologic fracture identified.  There is now diffusely abnormal marrow in the visible medial iliac bones.  IMPRESSION: 1. Progressed multiple myeloma in the L5 vertebra and sacrum. Bulky central sacral tumor which has eroded the posterior vertebral body cortex and tracks in the sacral epidural space engulfing the exiting left S1 and descending S2 nerve roots. 2. Progressed myelomatous involvement of the remaining lumbar levels, but with a  miliary pattern of osseous involvement and no extraosseous tumor. Stable L3 vertebral body augmentation since August. 3. Stable pathologic fractures and augmentation in the visible lower thoracic spine. 4. Superimposed lumbar spine degeneration with multilevel disc herniations, not significantly changed.  These results will be called to the ordering clinician or representative by the Radiologist Assistant, and communication documented in the PACS Dashboard.   Electronically Signed   By: Augusto Gamble M.D.   On: 07/07/2013 17:32   Mr Cervical Spine W Wo Contrast  07/27/2013   CLINICAL DATA:  Multiple myeloma.  Severe left arm pain.  EXAM: MRI CERVICAL SPINE WITHOUT AND WITH CONTRAST  TECHNIQUE: Multiplanar and multiecho pulse sequences of the cervical spine, to include the craniocervical junction and cervicothoracic junction, were obtained according to standard protocol without and with intravenous contrast.  CONTRAST:  19 ml MultiHance.  COMPARISON:  Cervical spine radiographs 10/30/2012. Cervical MRI 03/15/2012.  FINDINGS: Compared with the prior examination, there has slightly progressive heterogeneity of marrow signal and enhancement, most consistent with diffuse myelomatous involvement. There is no evidence of epidural tumor or pathologic fracture.  The craniocervical junction appears normal. The cervical cord is normal in signal and caliber. There is no abnormal  intradural enhancement. Bilateral vertebral artery flow voids are present.  Asymmetric enlargement of the left thyroid lobe is again noted, without substantial change.  There is stable relatively mild spondylosis with posterior osteophytes and uncinate spurring at C5-6 and C6-7. The resulting biforaminal stenosis, greater at C5-6, is unchanged. There is no cord deformity.  IMPRESSION: 1. Minimally progressive marrow heterogeneity throughout the cervical spine consistent with multiple myeloma. No pathologic fracture or epidural tumor demonstrated. 2.  Stable cervical spondylosis with uncinate spurring, greatest at C5-6 where there is stable moderate biforaminal stenosis. This could contribute to chronic exiting nerve root encroachment. No acute findings are identified.   Electronically Signed   By: Roxy Horseman M.D.   On: 07/27/2013 11:57   Mr Thoracic Spine W Wo Contrast  07/27/2013   CLINICAL DATA:  Multiple myeloma.  Right-sided back pain.  EXAM: MRI THORACIC SPINE WITHOUT AND WITH CONTRAST  TECHNIQUE: Multiplanar and multiecho pulse sequences of the thoracic spine were obtained without and with intravenous contrast.  CONTRAST:  19mL MULTIHANCE GADOBENATE DIMEGLUMINE 529 MG/ML IV SOLN  COMPARISON:  None.  FINDINGS: Flowing osteophytes fuse the thoracic spine from T3 through T11. Does the patient have a history of ankylosing spondylitis? The disc spaces are essentially gone from T5-6 through T10-11.  There is evidence of multiple lesions in the thoracic spine including the T2, T5, T6, T9, T10, T11, and in the lumbar spine at the L1 and L2 and L3 vertebra. There are also lesions in the right 2nd and 7th ribs posteriorly and in the left 5th rib.  There is an old pathologic fracture of T12. The T9 through T12 vertebra have been treated with vertebroplasty.  The thoracic spinal cord appears normal. There is no compression of the thecal sac. There are no new pathologic fractures. Paraspinal soft tissues appear normal.  IMPRESSION: 1. Numerous lesions consistent with myeloma in the thoracic spine and ribs. 2. No compression of the thoracic spinal cord or of the nerves in the thoracic spine.   Electronically Signed   By: Geanie Cooley M.D.   On: 07/27/2013 12:12   Ct Forearm Left W Contrast  07/30/2013   CLINICAL DATA:  Forearm abscess.  Cellulitis.  Fasciitis.  EXAM: CT OF THE LEFT FOREARM WITH CONTRAST  TECHNIQUE: Multidetector CT imaging was performed following the standard protocol during bolus administration of intravenous contrast.  CONTRAST:   OMNIPAQUE IOHEXOL 300 MG/ML  SOLN  COMPARISON:  None.  FINDINGS: Study is technically degraded. The arm had to be scanned over the patient's abdomen due to body habitus. There is no gas identified in the soft tissues of the forearm. No discrete fluid collection is identified. Radius, ulna and humerus appear normal. The visualize carpal bones also appear normal.  IMPRESSION: Technically degraded study by positioning and body habitus. No gas within the soft tissues to suggest fasciitis. No abscess.   Electronically Signed   By: Andreas Newport M.D.   On: 07/30/2013 23:24   US Venous Img Upper Uni Left  07/30/2013   CLINICAL DATA:  Left forearm pain swelling and redness history of pulmonary embolus and previous inferior vena caval filter placement.  EXAM: Left UPPER EXTREMITY VENOUS DOPPLER ULTRASOUND  TECHNIQUE: Gray-scale sonography with graded compression, as well as color Doppler and duplex ultrasound were performed to evaluate the upper extremity deep venous system from the level of the subclavian vein and including the jugular, axillary, basilic and upper cephalic vein. Spectral Doppler was utilized to evaluate flow at rest and  with distal augmentation maneuvers.  COMPARISON:  None.  FINDINGS: Thrombus within deep veins:  None visualized.  Compressibility of deep veins:  Normal.  Duplex waveform respiratory phasicity:  Normal.  Duplex waveform response to augmentation:  Normal.  Venous reflux:  None visualized.  Other findings: There is mild superficial soft tissue edema over the forearm. Incidental note is made of possible nodules within the left thyroid lobe.  IMPRESSION: 1. There is no evidence of deep venous thrombosis within the left upper extremity. 2. There is subcutaneous edema. 3. There may be a nodule or nodules in the left thyroid lobe.   Electronically Signed   By: David  [place]   On: 07/30/2013 09:25   Ct Biopsy  07/31/2013   INDICATION: Evaluate for progression of multiple myeloma  EXAM: CT  BIOPSY  MEDICATIONS: Fentanyl 100 mcg IV; Versed 2 mg IV  ANESTHESIA/SEDATION: Sedation Time  10 minutes  CONTRAST:  None  PROCEDURE: Informed consent was obtained from the patient following an explanation of the procedure, risks, benefits and alternatives. The patient understands, agrees and consents for the procedure. All questions were addressed. A time out was performed prior to the initiation of the procedure. The patient was positioned prone and noncontrast localization CT was performed of the pelvis to demonstrate the iliac marrow spaces. The operative site was prepped and draped in the usual sterile fashion.  Under sterile conditions and local anesthesia, an 11 gauge coaxial bone biopsy needle was advanced into the left iliac marrow space. Needle position was confirmed with CT imaging. Initially, bone marrow aspiration was performed. Next, a bone marrow biopsy was obtained with the 11 gauge outer bone marrow device. Samples were prepared with the cytotechnologist and deemed adequate. The needle was removed intact. Hemostasis was obtained with compression and a dressing was placed. The patient tolerated the procedure well without immediate post procedural complication.  COMPLICATIONS: None immediate.  IMPRESSION: Successful CT guided left iliac bone marrow aspiration and core biopsy.   Electronically Signed   By: Simonne Come M.D.   On: 07/31/2013 10:49    Medications: Scheduled Meds: . aztreonam  1 g Intravenous Q8H  . baclofen  10 mg Oral TID  . diltiazem  120 mg Oral q morning - 10a  . filgrastim  480 mcg Subcutaneous Daily  . insulin aspart  0-9 Units Subcutaneous TID WC  . insulin aspart  3 Units Subcutaneous TID WC  . pantoprazole  20 mg Oral Daily  . predniSONE  20 mg Oral Q breakfast  . vitamin B-6  250 mg Oral Daily  . vancomycin  1,500 mg Intravenous Q12H      LOS: 3 days   Chaya Jan M.D. [facility] 08/02/2013, 5:18 PM Pager: [phone]  If 7PM-7AM, please  contact night-coverage [email or web address] Password [email or web address]

## 2013-08-02 NOTE — Progress Notes (Signed)
Left forearm is still looking better. not much erythema. He is afebrile. Cultures are negative. He continues on IV antibiotics. Still awaiting CBC today.  He did get some physical therapy yesterday. He is very motivated for this.  He continues radiation therapy to the spine.  Bone marrow results not yet back.  He is not too patient thinks that the left forearm should be better. I tried to reassure him that this can take 7-10 days.  His blood sugars are doing pretty well.  His vital signs are good. Temperature 98.4. Blood pressure 116/65. His left forearm has minimal erythema. The swelling has gone down nicely. There is still some slight tenderness to palpation. He has good distal pulse.  Again, I think we need to continue the IV antibiotics. I do have him on Neupogen for the leukopenia. Am still not sure as to what the source of the leukopenia is.  Hopefully, the bone marrow biopsy result will be out tomorrow.  I thank the staff on [address] in the hospitalist for helping [place] out.  Merry Christmas!!  Max Fickle 2:10-14

## 2013-08-03 ENCOUNTER — Other Ambulatory Visit (HOSPITAL_COMMUNITY): Payer: BC Managed Care – PPO

## 2013-08-03 ENCOUNTER — Ambulatory Visit
Admission: RE | Admit: 2013-08-03 | Discharge: 2013-08-03 | Disposition: A | Discharge status: Home / Self Care | Payer: BC Managed Care – PPO | Source: Ambulatory Visit | Attending: Radiation Oncology | Admitting: Radiation Oncology

## 2013-08-03 DIAGNOSIS — D61818 Other pancytopenia: Secondary | ICD-10-CM

## 2013-08-03 LAB — GLUCOSE, CAPILLARY
Glucose-Capillary: 117 mg/dL — ABNORMAL HIGH (ref 70–99)
Glucose-Capillary: 108 mg/dL — ABNORMAL HIGH (ref 70–99)

## 2013-08-03 LAB — CBC WITH DIFFERENTIAL/PLATELET
Basophils Absolute: 0 10*3/uL (ref 0.0–0.1)
Basophils Relative: 1 % (ref 0–1)
Eosinophils Absolute: 0 10*3/uL (ref 0.0–0.7)
Eosinophils Relative: 1 % (ref 0–5)
HCT: 29 % — ABNORMAL LOW (ref 39.0–52.0)
Hemoglobin: 10 g/dL — ABNORMAL LOW (ref 13.0–17.0)
Lymphocytes Relative: 13 % (ref 12–46)
MCHC: 34.5 g/dL (ref 30.0–36.0)
Neutro Abs: 2.6 10*3/uL (ref 1.7–7.7)
Neutrophils Relative %: 75 % (ref 43–77)
RBC: 3.06 MIL/uL — ABNORMAL LOW (ref 4.22–5.81)
RDW: 14.4 % (ref 11.5–15.5)
WBC: 3.5 10*3/uL — ABNORMAL LOW (ref 4.0–10.5)

## 2013-08-03 LAB — COMPREHENSIVE METABOLIC PANEL
ALT: 59 U/L — ABNORMAL HIGH (ref 0–53)
AST: 31 U/L (ref 0–37)
Calcium: 7.6 mg/dL — ABNORMAL LOW (ref 8.4–10.5)
Creatinine, Ser: 0.63 mg/dL (ref 0.50–1.35)
GFR calc Af Amer: >90 mL/min (ref >90)
Sodium: 135 mEq/L (ref 135–145)
Total Protein: 5.2 g/dL — ABNORMAL LOW (ref 6.0–8.3)

## 2013-08-03 MED ORDER — DEXTROSE 5 % IV SOLN
1.0000 g | Freq: Once | INTRAVENOUS | Status: AC
  Administered 2013-08-03: 1 g via INTRAVENOUS
  Filled 2013-08-03: qty 1

## 2013-08-03 MED ORDER — HYDROMORPHONE HCL PF 2 MG/ML IJ SOLN
2.0000 mg | INTRAMUSCULAR | Status: DC | PRN
  Administered 2013-08-03 (×3): 2 mg via INTRAVENOUS
  Filled 2013-08-03 (×3): qty 1

## 2013-08-03 MED ORDER — DOXYCYCLINE HYCLATE 100 MG PO TABS: 100.0000 mg | ORAL_TABLET | Freq: Two times a day (BID) | ORAL | Status: DC

## 2013-08-03 NOTE — Care Management Note (Signed)
PT recommendation for no further PT follow up. PTA pt independent. Identified PCP and pharmacy. No barriers or needs identified. Cm to sign off.    Roxy Manns Wylma Tatem,MSN,RN [phone]

## 2013-08-03 NOTE — Progress Notes (Signed)
Physical Therapy Treatment Patient Details Name: Robert Berger MRN: [id number] DOB: 03-13-1941 Today's Date: 08/03/2013 Time: 7829-5621 PT Time Calculation (min): 26 min  PT Assessment / Plan / Recommendation  History of Present Illness LUE pain, cellulitisLUE   PT Comments   Pt much improved today.Amb. X 200'. Pt declines need for HHPT.   Follow Up Recommendations  No PT follow up     Does the patient have the potential to tolerate intense rehabilitation     Barriers to Discharge        Equipment Recommendations  None recommended by PT    Recommendations for Other Services    Frequency Min 3X/week   Progress towards PT Goals Progress towards PT goals: Progressing toward goals  Plan Current plan remains appropriate    Precautions / Restrictions Precautions Precautions: Fall   Pertinent Vitals/Pain HR 125 sats 98% RA Dyspnea 2-3    Mobility  Bed Mobility Bed Mobility: Sit to Supine Supine to Sit: 6: Modified independent (Device/Increase time) Sit to Supine: 6: Modified independent (Device/Increase time) Details for Bed Mobility Assistance: extra time for getting trunk upright, pt wanted no assistance. Transfers Sit to Stand: 5: Supervision;From bed;From elevated surface Stand to Sit: 5: Supervision;To bed;With upper extremity assist Details for Transfer Assistance: cues for safety and UE use, raCHING BACK TO SIT DOWN. Ambulation/Gait Ambulation/Gait Assistance: 5: Supervision Ambulation Distance (Feet): 200 Feet Assistive device: Rolling walker Gait Pattern: Step-through pattern;Trunk flexed;Shuffle    Exercises     PT Diagnosis:    PT Problem List:   PT Treatment Interventions:     PT Goals (current goals can now be found in the care plan section)    Visit Information  Last PT Received On: 08/03/13 Assistance Needed: +1 History of Present Illness: LUE pain, cellulitisLUE    Subjective Data      Cognition  Cognition Arousal/Alertness:  Awake/alert    Balance     End of Session PT - End of Session Activity Tolerance: Patient tolerated treatment well;Patient limited by fatigue Patient left: in bed;with call bell/phone within reach Nurse Communication: Mobility status   GP     Rada Hay 08/03/2013, 9:31 AM Blanchard Kelch PT [phone]

## 2013-08-03 NOTE — Discharge Summary (Signed)
Physician Discharge Summary  TREMAYNE SHELDON ZOX:[id number] DOB: 17-Dec-1940 DOA: 07/30/2013  PCP: Lemont Fillers., NP  Admit date: 07/30/2013 Discharge date: 08/03/2013  Time spent: 45 minutes  Recommendations for Outpatient Follow-up:  -Will be discharged home today to complete 14 days of doxycycline. -Followup with Dr. Myna Hidalgo as scheduled for early January.   Discharge Diagnoses:  Principal Problem:   Cellulitis of left arm Active Problems:   Multiple myeloma   Diabetes mellitus   Other pancytopenia   Discharge Condition: Stable and improved  Filed Weights   07/30/13 1229  Weight: 90.719 kg (200 lb)    History of present illness:  Patient is a 72 year old white man with history of multiple myeloma with recent progression with metastatic lesions to his lumbar spine for which he is currently receiving radiation therapy, diabetes mellitus, he also wears a colostomy bag for a prior episode of diverticulitis with perforation. He states that about 2 days ago he started noticing left arm pain redness and swelling that progressed to the point where he became concerned. He called Dr. Myna Hidalgo scheduled him today for an ultrasound to make sure he did not have a DVT. Ultrasound was negative, however when Dr. Myna Hidalgo examined his arm he decided to directly admit him to the hospital for IV antibiotics. He denies fevers, chills. We were asked to admit him for further evaluation and management.   Hospital Course:   Left Arm cellulitis  -Much improved on Vanc/Aztreonam. -Plan to transition to Doxy for 14 more days.  Multiple Myeloma  -With disease progression.  -Had bone marrow biopsy 12/23.  -Management as per oncology.   DM  -Fair control.   Leukopenia/Thrombocytopenia  -?related to revlimid. Doubt from his acute infection.  -Counts improving with neupogen. -Received his last dose today.  Procedures:  Bone Marrow Biopsy   Consultations:  Oncology, Dr.  Myna Hidalgo  Discharge Instructions  Discharge Orders   Future Appointments Provider Department Dept Phone   08/03/2013 2:35 PM [facility] [id number] [facility] [phone]   08/06/2013 2:35 PM [facility] [id number] [facility] [phone]   08/07/2013 2:35 PM [facility] [id number] [facility] [phone]   08/10/2013 4:40 PM [facility] [id number] [facility] [phone]   08/13/2013 1:00 PM Rachael Fee [facility] [phone]   08/13/2013 1:30 PM [name], MD [facility] [phone]   08/13/2013 2:15 PM [facility] [place] [phone]   08/20/2013 10:00 AM Sandford Craze, NP [facility] [phone]   Future Orders Complete By Expires   Diet - low sodium heart healthy  As directed    Discontinue IV  As directed    Increase activity slowly  As directed        Medication List         aspirin 81 MG tablet  Take 162 mg by mouth daily.     baclofen 10 MG tablet  Commonly known as:  LIORESAL  Take 10 mg by mouth 3 (three) times daily.     diltiazem 120 MG 24 hr capsule  Commonly known as:  CARDIZEM CD  Take 120 mg by mouth every morning.     doxycycline 100 MG tablet  Commonly known as:  VIBRA-TABS  Take 1 tablet (100 mg total) by mouth 2 (two) times daily. For 14 days     HYDROmorphone  2 MG tablet  Commonly known as:  DILAUDID  Take 2-4 mg by mouth every 4 (four) hours as needed for moderate pain or severe pain.     lansoprazole 30 MG capsule  Commonly known as:  PREVACID  TAKE ONE CAPSULE EVERY DAY     lenalidomide 15 MG capsule  Commonly known as:  REVLIMID  Take 15 mg by mouth daily. Take 1 cap daily for 21 days then 7 days off.  Authorization # is [id number]     lisinopril 2.5 MG tablet  Commonly known as:   PRINIVIL,ZESTRIL  Take 2.5 mg by mouth daily with breakfast.     meloxicam 15 MG tablet  Commonly known as:  MOBIC  Take 1 tablet (15 mg total) by mouth daily.     ondansetron 4 MG disintegrating tablet  Commonly known as:  ZOFRAN-ODT  Take 4 mg by mouth every 12 (twelve) hours as needed for nausea.     predniSONE 20 MG tablet  Commonly known as:  DELTASONE  Take 20 mg by mouth daily with breakfast. Take 1 pill ady with food.     vitamin B-6 250 MG tablet  Take 250 mg by mouth daily.       Allergies  Allergen Reactions  . Penicillins Nausea And Vomiting, Swelling and Rash       Follow-up Information   Follow up with [name], MD. (as scheduled for early January)    Specialty:  Oncology   Contact information:   [address] [place] [address] [phone]        The results of significant diagnostics from this hospitalization (including imaging, microbiology, ancillary and laboratory) are listed below for reference.    Significant Diagnostic Studies: Dg Forearm Left  07/26/2013   CLINICAL DATA:  History of multiple myeloma and left arm pain. Evaluate for lesion or fracture.  EXAM: LEFT FOREARM - 2 VIEW  COMPARISON:  Left elbow 04/25/2012  FINDINGS: Two views of the forearm were obtained. No evidence for fracture or dislocation. No suspicious bony lesions are identified. Small calcifications or densities in the soft tissues.  IMPRESSION: No acute bone abnormality in the left forearm.   Electronically Signed   By: Richarda Overlie M.D.   On: 07/26/2013 13:58   Mr Lumbar Spine Wo Contrast  07/07/2013   CLINICAL DATA:  72 year old male with multiple myeloma. Severe right side low back pain. Left side pain radiating to the lower extremity. Initial encounter.  EXAM: MRI LUMBAR SPINE WITHOUT CONTRAST  TECHNIQUE: Multiplanar, multisequence MR imaging was performed. No intravenous contrast was administered.  COMPARISON:  CT Abdomen and Pelvis 03/19/2013.  Lumbar MRI 12/20/2012.  FINDINGS: Same numbering system as on the comparisons.  Sequelae of T12 pathologic compression fracture status post augmentation and adjacent T11 vertebral body augmentation appear stable since 12/20/2012. Partially visible T10 level, also was seen to be augmented in August. Visualized lower thoracic spinal cord is normal with conus medularis at L1. There is a chronic right paracentral disc protrusion at T12-L1, stable since May.  Abnormal bone marrow signal now throughout the lumbar spine, with heterogeneous loss of the normal fatty marrow signal which predominated in May.  The L1 and L2 levels remain intact and demonstrate no epidural or foraminal tumor. Chronic L2-L3 central disc protrusion has not significantly changed. No significant spinal stenosis.  The L3 level has been augmented, and appears stable since August. The posterior L3 vertebral body tumor appears mildly progressed in overall size (now  up to 19 mm diameter, 15 mm in May. No epidural or extraosseous tumor at this level. Central disc protrusion at L3 appears stable (series 5, image 24). Mild left lateral recess stenosis as before.  The L4 vertebra remains intact. Central L4 disc protrusion superimposed on facet hypertrophy with bilateral lateral recess stenosis is stable. No significant spinal stenosis. No epidural or extraosseous tumor at this level.  The L5 level is now almost completely replaced by tumor. Still, no epidural or foraminal disease is identified. Stable epidural lipomatosis. Stable superimposed L5-S1 facet degeneration.  New expansile tumor centered at the lower S1/S1 S2 level, has eroded through the posterior vertebral body and extends in the epidural space to the left of midline. The exiting left S1 nerve is engulfed by tumor (series 7, image 39). The descending left S2 nerve roots are engulfed (image 38). No sacral pathologic fracture identified.  There is now diffusely abnormal marrow in the visible medial  iliac bones.  IMPRESSION: 1. Progressed multiple myeloma in the L5 vertebra and sacrum. Bulky central sacral tumor which has eroded the posterior vertebral body cortex and tracks in the sacral epidural space engulfing the exiting left S1 and descending S2 nerve roots. 2. Progressed myelomatous involvement of the remaining lumbar levels, but with a miliary pattern of osseous involvement and no extraosseous tumor. Stable L3 vertebral body augmentation since August. 3. Stable pathologic fractures and augmentation in the visible lower thoracic spine. 4. Superimposed lumbar spine degeneration with multilevel disc herniations, not significantly changed.  These results will be called to the ordering clinician or representative by the Radiologist Assistant, and communication documented in the PACS Dashboard.   Electronically Signed   By: Augusto Gamble M.D.   On: 07/07/2013 17:32   Mr Cervical Spine W Wo Contrast  07/27/2013   CLINICAL DATA:  Multiple myeloma.  Severe left arm pain.  EXAM: MRI CERVICAL SPINE WITHOUT AND WITH CONTRAST  TECHNIQUE: Multiplanar and multiecho pulse sequences of the cervical spine, to include the craniocervical junction and cervicothoracic junction, were obtained according to standard protocol without and with intravenous contrast.  CONTRAST:  19 ml MultiHance.  COMPARISON:  Cervical spine radiographs 10/30/2012. Cervical MRI 03/15/2012.  FINDINGS: Compared with the prior examination, there has slightly progressive heterogeneity of marrow signal and enhancement, most consistent with diffuse myelomatous involvement. There is no evidence of epidural tumor or pathologic fracture.  The craniocervical junction appears normal. The cervical cord is normal in signal and caliber. There is no abnormal intradural enhancement. Bilateral vertebral artery flow voids are present.  Asymmetric enlargement of the left thyroid lobe is again noted, without substantial change.  There is stable relatively mild  spondylosis with posterior osteophytes and uncinate spurring at C5-6 and C6-7. The resulting biforaminal stenosis, greater at C5-6, is unchanged. There is no cord deformity.  IMPRESSION: 1. Minimally progressive marrow heterogeneity throughout the cervical spine consistent with multiple myeloma. No pathologic fracture or epidural tumor demonstrated. 2. Stable cervical spondylosis with uncinate spurring, greatest at C5-6 where there is stable moderate biforaminal stenosis. This could contribute to chronic exiting nerve root encroachment. No acute findings are identified.   Electronically Signed   By: Roxy Horseman M.D.   On: 07/27/2013 11:57   Mr Thoracic Spine W Wo Contrast  07/27/2013   CLINICAL DATA:  Multiple myeloma.  Right-sided back pain.  EXAM: MRI THORACIC SPINE WITHOUT AND WITH CONTRAST  TECHNIQUE: Multiplanar and multiecho pulse sequences of the thoracic spine were obtained without and with intravenous contrast.  CONTRAST:  19mL MULTIHANCE GADOBENATE DIMEGLUMINE 529 MG/ML IV SOLN  COMPARISON:  None.  FINDINGS: Flowing osteophytes fuse the thoracic spine from T3 through T11. Does the patient have a history of ankylosing spondylitis? The disc spaces are essentially gone from T5-6 through T10-11.  There is evidence of multiple lesions in the thoracic spine including the T2, T5, T6, T9, T10, T11, and in the lumbar spine at the L1 and L2 and L3 vertebra. There are also lesions in the right 2nd and 7th ribs posteriorly and in the left 5th rib.  There is an old pathologic fracture of T12. The T9 through T12 vertebra have been treated with vertebroplasty.  The thoracic spinal cord appears normal. There is no compression of the thecal sac. There are no new pathologic fractures. Paraspinal soft tissues appear normal.  IMPRESSION: 1. Numerous lesions consistent with myeloma in the thoracic spine and ribs. 2. No compression of the thoracic spinal cord or of the nerves in the thoracic spine.   Electronically Signed    By: Geanie Cooley M.D.   On: 07/27/2013 12:12   Ct Forearm Left W Contrast  07/30/2013   CLINICAL DATA:  Forearm abscess.  Cellulitis.  Fasciitis.  EXAM: CT OF THE LEFT FOREARM WITH CONTRAST  TECHNIQUE: Multidetector CT imaging was performed following the standard protocol during bolus administration of intravenous contrast.  CONTRAST:  OMNIPAQUE IOHEXOL 300 MG/ML  SOLN  COMPARISON:  None.  FINDINGS: Study is technically degraded. The arm had to be scanned over the patient's abdomen due to body habitus. There is no gas identified in the soft tissues of the forearm. No discrete fluid collection is identified. Radius, ulna and humerus appear normal. The visualize carpal bones also appear normal.  IMPRESSION: Technically degraded study by positioning and body habitus. No gas within the soft tissues to suggest fasciitis. No abscess.   Electronically Signed   By: Andreas Newport M.D.   On: 07/30/2013 23:24   US Venous Img Upper Uni Left  07/30/2013   CLINICAL DATA:  Left forearm pain swelling and redness history of pulmonary embolus and previous inferior vena caval filter placement.  EXAM: Left UPPER EXTREMITY VENOUS DOPPLER ULTRASOUND  TECHNIQUE: Gray-scale sonography with graded compression, as well as color Doppler and duplex ultrasound were performed to evaluate the upper extremity deep venous system from the level of the subclavian vein and including the jugular, axillary, basilic and upper cephalic vein. Spectral Doppler was utilized to evaluate flow at rest and with distal augmentation maneuvers.  COMPARISON:  None.  FINDINGS: Thrombus within deep veins:  None visualized.  Compressibility of deep veins:  Normal.  Duplex waveform respiratory phasicity:  Normal.  Duplex waveform response to augmentation:  Normal.  Venous reflux:  None visualized.  Other findings: There is mild superficial soft tissue edema over the forearm. Incidental note is made of possible nodules within the left thyroid lobe.   IMPRESSION: 1. There is no evidence of deep venous thrombosis within the left upper extremity. 2. There is subcutaneous edema. 3. There may be a nodule or nodules in the left thyroid lobe.   Electronically Signed   By: David  [place]   On: 07/30/2013 09:25   Ct Biopsy  07/31/2013   INDICATION: Evaluate for progression of multiple myeloma  EXAM: CT BIOPSY  MEDICATIONS: Fentanyl 100 mcg IV; Versed 2 mg IV  ANESTHESIA/SEDATION: Sedation Time  10 minutes  CONTRAST:  None  PROCEDURE: Informed consent was obtained from the patient following an explanation of  the procedure, risks, benefits and alternatives. The patient understands, agrees and consents for the procedure. All questions were addressed. A time out was performed prior to the initiation of the procedure. The patient was positioned prone and noncontrast localization CT was performed of the pelvis to demonstrate the iliac marrow spaces. The operative site was prepped and draped in the usual sterile fashion.  Under sterile conditions and local anesthesia, an 11 gauge coaxial bone biopsy needle was advanced into the left iliac marrow space. Needle position was confirmed with CT imaging. Initially, bone marrow aspiration was performed. Next, a bone marrow biopsy was obtained with the 11 gauge outer bone marrow device. Samples were prepared with the cytotechnologist and deemed adequate. The needle was removed intact. Hemostasis was obtained with compression and a dressing was placed. The patient tolerated the procedure well without immediate post procedural complication.  COMPLICATIONS: None immediate.  IMPRESSION: Successful CT guided left iliac bone marrow aspiration and core biopsy.   Electronically Signed   By: Simonne Come M.D.   On: 07/31/2013 10:49    Microbiology: No results found for this or any previous visit (from the past 240 hour(s)).   Labs: Basic Metabolic Panel:  Recent Labs Lab 07/30/13 1450 07/31/13 0712 08/01/13 0450 08/02/13 0410  08/03/13 0425  NA  --  134* 135 137 135  K  --  3.5 3.8 3.3* 3.4*  CL  --  101 104 105 104  CO2  --  25 24 22 22   GLUCOSE  --  128* 172* 115* 115*  BUN  --  10 12 13 11   CREATININE 0.66 0.55 0.61 0.56 0.63  CALCIUM  --  8.1* 8.0* 8.4 7.6*   Liver Function Tests:  Recent Labs Lab 08/01/13 0450 08/02/13 0410 08/03/13 0425  AST 23 22 31   ALT 64* 64* 59*  ALKPHOS 106 114 120*  BILITOT 0.5 0.5 0.4  PROT 4.8* 5.2* 5.2*  ALBUMIN 1.8* 1.9* 1.9*   No results found for this basename: LIPASE, AMYLASE,  in the last 168 hours No results found for this basename: AMMONIA,  in the last 168 hours CBC:  Recent Labs Lab 07/30/13 1450 07/31/13 0712 08/01/13 0450 08/02/13 0815 08/03/13 0425  WBC 1.9* 1.7* 1.4* 3.4* 3.5*  NEUTROABS  --   --  0.9*  --  2.6  HGB 11.6* 10.3* 9.9* 10.1* 10.0*  HCT 33.2* 29.7* 29.3* 29.7* 29.0*  MCV 95.4 95.2 95.8 95.2 94.8  PLT 21* 26* 23* 38* 45*   Cardiac Enzymes: No results found for this basename: CKTOTAL, CKMB, CKMBINDEX, TROPONINI,  in the last 168 hours BNP: BNP (last 3 results) No results found for this basename: PROBNP,  in the last 8760 hours CBG:  Recent Labs Lab 08/02/13 1151 08/02/13 1712 08/02/13 2130 08/03/13 0735 08/03/13 1136  GLUCAP 163* 143* 154* 117* 108*       Signed:  HERNANDEZ ACOSTA,ESTELA  [facility] Pager: [phone] 08/03/2013, 11:50 AM

## 2013-08-03 NOTE — Progress Notes (Signed)
Ms. Anitra Lauth is slowly improving. The cellulitis in the left forearm is getting better. There is still some erythema in the proximal aspect of the left forearm. The swelling has gone down. Left forearm is not as tender.  He continues on vancomycin and aztreonam. I would continue these for another day and then put him on oral antibiotics.  He will receive radiation therapy today.  The bone marrow report still not back yet.  I still have him on Neupogen for his leukopenia.   His appetite is doing okay. He's had no nausea vomiting.  His vital signs are stable. He is afebrile with a temperature of 98.8. Lungs are clear. Cardiac exam regular rate and rhythm. Abdomen is soft. There are no oral lesions. Extremities shows decreased swelling and erythema in the left forearm. There is still some slight tenderness to palpation in the proximal extensor aspect of the left forearm. He does have some healing wounds on the elbow. Pulses intact. Lower extremities show some stasis dermatitis type changes.   White cell count is 3.5. Hemoglobin 10 platelet count 45. BUN 11 creatinine 0.63. Albumin is 1.9. Total protein 5.2.  Again, the cellulitis is improving. I would still keep him on IV antibiotics one more day and if things look good tomorrow, his try to switch him over to oral antibiotics. I probably would have him on a 10-14 day course of antibiotics.  We will see what is bone marrow biopsy shows. Hopefully this will be back today.  I would do Neupogen today and then discontinue.  His platelet count is recovering.  Pete E.  Gal 2:20

## 2013-08-06 ENCOUNTER — Ambulatory Visit
Admission: RE | Admit: 2013-08-06 | Discharge: 2013-08-06 | Disposition: A | Discharge status: Home / Self Care | Payer: BC Managed Care – PPO | Source: Ambulatory Visit | Attending: Radiation Oncology | Admitting: Radiation Oncology

## 2013-08-06 ENCOUNTER — Other Ambulatory Visit: Payer: Self-pay | Admitting: Nurse Practitioner

## 2013-08-06 ENCOUNTER — Other Ambulatory Visit: Payer: Self-pay | Admitting: Hematology & Oncology

## 2013-08-06 DIAGNOSIS — C9000 Multiple myeloma not having achieved remission: Secondary | ICD-10-CM

## 2013-08-06 MED ORDER — CLARITHROMYCIN 500 MG PO TABS: 500.0000 mg | ORAL_TABLET | Freq: Two times a day (BID) | ORAL | Status: DC

## 2013-08-06 MED ORDER — POMALIDOMIDE 4 MG PO CAPS: 4.0000 mg | ORAL_CAPSULE | Freq: Every day | ORAL | Status: DC

## 2013-08-06 NOTE — Telephone Encounter (Signed)
Pt started on Pomalyst 4mg . Rx sent to [facility]. Auth # [id number].

## 2013-08-07 ENCOUNTER — Ambulatory Visit: Payer: BC Managed Care – PPO

## 2013-08-07 ENCOUNTER — Ambulatory Visit
Admission: RE | Admit: 2013-08-07 | Discharge: 2013-08-07 | Disposition: A | Discharge status: Home / Self Care | Payer: BC Managed Care – PPO | Source: Ambulatory Visit | Attending: Radiation Oncology | Admitting: Radiation Oncology

## 2013-08-07 VITALS — BP 114/80 | HR 112 | Temp 97.8°F | Ht 67.0 in | Wt 194.1 lb

## 2013-08-07 DIAGNOSIS — C9000 Multiple myeloma not having achieved remission: Secondary | ICD-10-CM

## 2013-08-07 NOTE — Progress Notes (Addendum)
Robert Berger has had 9 fractions to L5 and his sacrum.  He is having generalized pain today at a 5/10.  He takes 2-4 mg of dilaudid tablets as needed.  He reports weakness in his legs.  He was using a cane when he first started and now is using a walker.  He reports a good appetitie.  He has lost 6 lbs since 12/22 but thinks it is due to being in the hospital for cellulitis last week.  Orthostatic vitals done: bp sitting 110/71, hr 94, bp standing 114/80, hr 112.   He denies nausea.  He is not taking decadron but is taking prednisone 20 mg daily.  His skin in the treatment area is intact.

## 2013-08-07 NOTE — Progress Notes (Signed)
Weekly Management Note Current Dose: 22.5  Gy  Projected Dose: 25 Gy   Narrative:  The patient presents for routine under treatment assessment.  CBCT/MVCT images/Port film x-rays were reviewed.  The chart was checked. Doing well. Less pain. Fatigued and weak (hospital last week for cellulitis). Would like to get off dilaudid but still needs it for pain control.   Physical Findings: Weight: 194 lb 1.6 oz (88.043 kg). Alert in a wheelchair.  Impression:  The patient is tolerating radiation.  Plan:  Continue treatment as planned. Follow up in 1 month. Meets with Ennever on Monday to discuss further plans. Discussed time course of radiation effect on pain.

## 2013-08-08 ENCOUNTER — Ambulatory Visit
Admission: RE | Admit: 2013-08-08 | Discharge: 2013-08-08 | Disposition: A | Discharge status: Home / Self Care | Payer: BC Managed Care – PPO | Source: Ambulatory Visit | Attending: Radiation Oncology | Admitting: Radiation Oncology

## 2013-08-08 NOTE — Addendum Note (Signed)
Encounter addended by: Eduardo Osier, RN on: 08/08/2013  8:43 AM<BR>     Documentation filed: Notes Section

## 2013-08-10 ENCOUNTER — Ambulatory Visit: Payer: BC Managed Care – PPO

## 2013-08-10 ENCOUNTER — Encounter: Payer: Self-pay | Admitting: *Deleted

## 2013-08-10 NOTE — Progress Notes (Signed)
Notified [facility] that patient will not be getting Revlimid anymore per Dr Marin Olp.  Patient currently on Pomalyst

## 2013-08-13 ENCOUNTER — Ambulatory Visit (HOSPITAL_BASED_OUTPATIENT_CLINIC_OR_DEPARTMENT_OTHER): Payer: BC Managed Care – PPO | Admitting: Hematology & Oncology

## 2013-08-13 ENCOUNTER — Ambulatory Visit (HOSPITAL_BASED_OUTPATIENT_CLINIC_OR_DEPARTMENT_OTHER): Payer: BC Managed Care – PPO | Admitting: Lab

## 2013-08-13 ENCOUNTER — Telehealth: Payer: Self-pay | Admitting: Hematology & Oncology

## 2013-08-13 ENCOUNTER — Ambulatory Visit (HOSPITAL_BASED_OUTPATIENT_CLINIC_OR_DEPARTMENT_OTHER): Payer: BC Managed Care – PPO

## 2013-08-13 VITALS — BP 120/74 | HR 100 | Temp 98.0°F | Resp 18 | Ht 67.0 in | Wt 195.0 lb

## 2013-08-13 DIAGNOSIS — C9000 Multiple myeloma not having achieved remission: Secondary | ICD-10-CM

## 2013-08-13 DIAGNOSIS — M4810 Ankylosing hyperostosis [Forestier], site unspecified: Secondary | ICD-10-CM

## 2013-08-13 LAB — CMP (CANCER CENTER ONLY)
ALBUMIN: 2.5 g/dL — AB (ref 3.3–5.5)
ALT(SGPT): 41 U/L (ref 10–47)
AST: 36 U/L (ref 11–38)
Alkaline Phosphatase: 108 U/L — ABNORMAL HIGH (ref 26–84)
BUN, Bld: 15 mg/dL (ref 7–22)
CALCIUM: 8.9 mg/dL (ref 8.0–10.3)
CHLORIDE: 103 meq/L (ref 98–108)
CO2: 32 meq/L (ref 18–33)
Creat: 0.7 mg/dl (ref 0.6–1.2)
GLUCOSE: 139 mg/dL — AB (ref 73–118)
POTASSIUM: 4.1 meq/L (ref 3.3–4.7)
SODIUM: 143 meq/L (ref 128–145)
TOTAL PROTEIN: 7.2 g/dL (ref 6.4–8.1)
Total Bilirubin: 0.7 mg/dl (ref 0.20–1.60)

## 2013-08-13 LAB — CBC WITH DIFFERENTIAL (CANCER CENTER ONLY)
HEMATOCRIT: 32.2 % — AB (ref 38.7–49.9)
HGB: 10.5 g/dL — ABNORMAL LOW (ref 13.0–17.1)
MCH: 31.9 pg (ref 28.0–33.4)
MCHC: 32.6 g/dL (ref 32.0–35.9)
MCV: 98 fL (ref 82–98)
PLATELETS: 148 10*3/uL (ref 145–400)
RBC: 3.29 10*6/uL — ABNORMAL LOW (ref 4.20–5.70)
RDW: 15.6 % (ref 11.1–15.7)
WBC: 9.2 10*3/uL (ref 4.0–10.0)

## 2013-08-13 LAB — MANUAL DIFFERENTIAL (CHCC SATELLITE)
ALC: 1.8 10*3/uL (ref 0.9–3.3)
ANC (CHCC HP manual diff): 5.4 10*3/uL (ref 1.5–6.5)
Band Neutrophils: 5 % (ref 0–10)
LYMPH: 20 % (ref 14–48)
MONO: 6 % (ref 0–13)
Myelocytes: 1 % — ABNORMAL HIGH (ref 0–0)
Other Cells: 16 % — ABNORMAL HIGH (ref 0–0)
PLT EST ~~LOC~~: DECREASED
Platelet Morphology: NORMAL
SEG: 52 % (ref 40–75)

## 2013-08-13 MED ORDER — OXYCODONE-ACETAMINOPHEN 5-325 MG PO TABS: 1.0000 | ORAL_TABLET | ORAL | Status: DC | PRN

## 2013-08-13 MED ORDER — ZOLEDRONIC ACID 4 MG/5ML IV CONC: 4.0000 mg | Freq: Once | INTRAVENOUS | Status: DC

## 2013-08-13 MED ORDER — DOXYCYCLINE HYCLATE 100 MG PO TABS: 100.0000 mg | ORAL_TABLET | Freq: Two times a day (BID) | ORAL | Status: DC

## 2013-08-13 MED ORDER — SODIUM CHLORIDE 0.9 % IV SOLN
Freq: Once | INTRAVENOUS | Status: AC
  Administered 2013-08-13: 15:00:00 via INTRAVENOUS

## 2013-08-13 NOTE — Progress Notes (Signed)
Pt not due for Zometa, to reschedule.

## 2013-08-13 NOTE — Telephone Encounter (Signed)
Left pt message with 11-6 appointment °

## 2013-08-13 NOTE — Progress Notes (Signed)
This office note has been dictated.

## 2013-08-14 NOTE — Progress Notes (Signed)
DIAGNOSIS: 1. Relapse/progressive IgG kappa myeloma. 2. History of deep vein thrombosis/pulmonary embolism.  CURRENT THERAPY: 1. Patient completed radiation therapy to the lumbosacral spine. 2. Zometa 4 mg IV monthly. 3. Aspirin 162 mg p.o. daily.  INTERIM HISTORY:  [name] comes in for followup.  Unfortunately, his myeloma clearly is back.  He was admitted right before Christmas.  He had a bad cellulitis in the left forearm.  He was placed on IV antibiotics.  He did have a bone marrow done in our hospital. This was done on December 23rd.  The bone marrow report ([place] [id number]) showed plasma cell myeloma.  He had 50% plasma cells.  His plasma cells appeared to be quite immature.  They are markedly atypical with large forms, irregular nuclear contours, and prominent nucleoli.  I think what will be essential now is the cytogenetics.  These we do not have back yet.  We are very interested to see what his cytogenetics are.  He is doing better from the cellulitis.  He does have little bit of swelling in the left forearm.  He is just weak overall.  This may be from the steroids that he is on. He is on prednisone.  It also could be from radiation that he took.  He clearly has a cologne that is now evolved and is quite aggressive.  His myeloma studies that we had gotten on appeared to be looking better.  There is still something unusual about his myeloma.  His kappa light chain in his urine is 312 mg a day.  He is eating okay.  He is having no problems with nausea or vomiting. 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  He does feel some pressure down in his pelvic region.  He has had no leg swelling.  He has had no rashes with the cellulitis of his left forearm.  He has had no cough.  Overall, his performance status is ECOG 1-2.  PHYSICAL EXAMINATION:  General:  This is an elderly appearing, white gentleman, in no obvious distress.  Vital Signs:  Temperature of 98, pulse 100, respiratory  rate 18, blood pressure 120/74.  Weight is 195 pounds.  Head and Neck:  Shows a normocephalic, atraumatic skull.  He has no ocular or oral lesions.  There is no mucositis within the oral cavity.  He has no adenopathy in the neck.  Lungs:  Clear bilaterally. No rales, wheezes, or rhonchi.  Cardiac:  Regular rate and rhythm with a normal S1, S2.  There are no murmurs, rubs, or bruits.  Abdomen:  Soft. He has good bowel sounds.  There is no fluid wave.  There is no palpable abdominal mass.  He has a colostomy that is intact.  There is no palpable hepatosplenomegaly.  Extremities:  Show some stasis dermatitis changes in his lower legs.  Neurological:  He has good strength in his arms and legs.  Skin:  Shows some scattered ecchymoses.  LABORATORY STUDIES:  White cell count is 9.2, hemoglobin 10.5, hematocrit 32.2, platelet count 149.  IMPRESSION:  [name] is a 73 year old gentleman.  He initially presented, I think back in August of 2013.  He had severe spinal involvement.  He was seen for kyphoplasty.  He got radiation therapy. We had him on chemotherapy.  He did not tolerate Velcade well.  We then got him on Revlimid.  Again, he had been doing incredibly well with a decrease in his myeloma numbers.  However, we now have clear evidence of progression.  I think  we are going to have to be aggressive and try to treat him with kyphosis and Cytoxan.  I think this would be a reasonable way to try to decrease the recurrent myeloma burden.  He will need a Port-A-Cath.  I spent about 45 minutes with him and his wife.  They are both very, very nice.  I just feel bad for [name] as he really wants to lead an active lifestyle, but just cannot right now.  We will try to get things set up for him next week.  I went over the side effects of the chemotherapy with him and his wife. I told him that he probably would lose his hair.  I told him that he may get a little bit more fatigue.  We will  need to watch out for his blood counts.  There certainly is a risk of infection.  I may go ahead and get him on prophylactic Famvir.  He is on prednisone 20 mg a day.  We will try to taper him down off this.  I told him to cut the prednisone dose in half.  He would then take this for 7 days and then stop.  [name]' myeloma is almost behaving as if it is evolving into a plasma cell leukemia.  I looked at his blood smear.  He does have some unusual appearing plasma cells in his peripheral blood, some had nucleoli.  Again, it will be very interesting to see what his cytogenetics show.  I will get [name] back to see me within a month, easily.  Certainly it is conceivable that we may actually have to consider using aggressive chemotherapy on him (i.e. VD-PACE).  I will plan to see him back in another couple of weeks or so.    ______________________________ Volanda Napoleon, M.D. PRE/MEDQ  D:  08/13/2013  T:  08/14/2013  Job:  [id number]

## 2013-08-14 NOTE — Telephone Encounter (Signed)
Pt instructed to decrease the dose of decadron from 20 to 10 mg BID. To call back if further questions or concerns.

## 2013-08-15 LAB — IGG, IGA, IGM
IGM, SERUM: 24 mg/dL — AB (ref 41–251)
IgA: 22 mg/dL — ABNORMAL LOW (ref 68–379)
IgG (Immunoglobin G), Serum: 1840 mg/dL — ABNORMAL HIGH (ref 650–1600)

## 2013-08-15 LAB — KAPPA/LAMBDA LIGHT CHAINS
Kappa free light chain: 21.1 mg/dL — ABNORMAL HIGH (ref 0.33–1.94)
Kappa:Lambda Ratio: 22.21 — ABNORMAL HIGH (ref 0.26–1.65)
Lambda Free Lght Chn: 0.95 mg/dL (ref 0.57–2.63)

## 2013-08-15 LAB — PROTEIN ELECTROPHORESIS, SERUM, WITH REFLEX
ALBUMIN ELP: 43.9 % — AB (ref 55.8–66.1)
ALPHA-1-GLOBULIN: 7.3 % — AB (ref 2.9–4.9)
ALPHA-2-GLOBULIN: 16 % — AB (ref 7.1–11.8)
BETA GLOBULIN: 5.5 % (ref 4.7–7.2)
Beta 2: 4.6 % (ref 3.2–6.5)
Gamma Globulin: 22.7 % — ABNORMAL HIGH (ref 11.1–18.8)
M-SPIKE, %: 1.29 g/dL
TOTAL PROTEIN, SERUM ELECTROPHOR: 6.9 g/dL (ref 6.0–8.3)

## 2013-08-15 LAB — IFE INTERPRETATION

## 2013-08-16 ENCOUNTER — Other Ambulatory Visit: Payer: BC Managed Care – PPO

## 2013-08-16 ENCOUNTER — Encounter: Payer: Self-pay | Admitting: *Deleted

## 2013-08-16 ENCOUNTER — Encounter (HOSPITAL_COMMUNITY): Payer: Self-pay | Admitting: Pharmacy Technician

## 2013-08-17 ENCOUNTER — Other Ambulatory Visit: Payer: Self-pay | Admitting: Radiology

## 2013-08-17 LAB — TISSUE HYBRIDIZATION (BONE MARROW)-NCBH

## 2013-08-17 LAB — CHROMOSOME ANALYSIS, BONE MARROW

## 2013-08-19 ENCOUNTER — Encounter: Payer: Self-pay | Admitting: Radiation Oncology

## 2013-08-19 NOTE — Progress Notes (Signed)
  Radiation Oncology         [phone] ________________________________  Name: DIO GILLER MRN: [id number]  Date: 08/19/2013  DOB: 06/20/1941  End of Treatment Note  Diagnosis:   Multiple myeloma     Indication for treatment:  Painful involvement in the lower back region  Radiation treatment dates:   December 16 through August 08, 2013  Site/dose:   Lower  lumbar spine and upper sacrum region, 25 gray in 10 fractions  Beams/energy:   4 field set up using AP, PA, right lateral, left lateral, 10 and 15 MV photons  Narrative: The patient tolerated radiation treatment relatively well.   He did notice some improvement in his pain in the lower back and left leg region.  He did have fatigue which in part may have been related to his cellulitis of the left forearm  Plan: The patient has completed radiation treatment. The patient will return to radiation oncology clinic for routine followup in one month. I advised them to call or return sooner if they have any questions or concerns related to their recovery or treatment.  -----------------------------------  Blair Promise, PhD, MD

## 2013-08-20 ENCOUNTER — Encounter: Payer: BC Managed Care – PPO | Admitting: Family

## 2013-08-20 ENCOUNTER — Other Ambulatory Visit (HOSPITAL_COMMUNITY): Payer: BC Managed Care – PPO

## 2013-08-20 ENCOUNTER — Encounter (HOSPITAL_COMMUNITY): Payer: Self-pay | Admitting: Emergency Medicine

## 2013-08-20 ENCOUNTER — Ambulatory Visit: Payer: BC Managed Care – PPO

## 2013-08-20 ENCOUNTER — Other Ambulatory Visit: Payer: BC Managed Care – PPO | Admitting: Lab

## 2013-08-20 ENCOUNTER — Ambulatory Visit: Payer: BC Managed Care – PPO | Admitting: Hematology & Oncology

## 2013-08-20 ENCOUNTER — Telehealth: Payer: Self-pay | Admitting: Hematology & Oncology

## 2013-08-20 ENCOUNTER — Emergency Department (HOSPITAL_COMMUNITY)
Admission: EM | Admit: 2013-08-20 | Discharge: 2013-08-20 | Disposition: A | Discharge status: Home / Self Care | Payer: BC Managed Care – PPO | Attending: Emergency Medicine | Admitting: Emergency Medicine

## 2013-08-20 ENCOUNTER — Emergency Department (HOSPITAL_COMMUNITY): Payer: BC Managed Care – PPO

## 2013-08-20 DIAGNOSIS — R21 Rash and other nonspecific skin eruption: Secondary | ICD-10-CM | POA: Insufficient documentation

## 2013-08-20 DIAGNOSIS — R0989 Other specified symptoms and signs involving the circulatory and respiratory systems: Secondary | ICD-10-CM | POA: Insufficient documentation

## 2013-08-20 DIAGNOSIS — R0609 Other forms of dyspnea: Secondary | ICD-10-CM | POA: Insufficient documentation

## 2013-08-20 DIAGNOSIS — Z87891 Personal history of nicotine dependence: Secondary | ICD-10-CM | POA: Insufficient documentation

## 2013-08-20 DIAGNOSIS — Z86718 Personal history of other venous thrombosis and embolism: Secondary | ICD-10-CM | POA: Insufficient documentation

## 2013-08-20 DIAGNOSIS — Z87898 Personal history of other specified conditions: Secondary | ICD-10-CM | POA: Insufficient documentation

## 2013-08-20 DIAGNOSIS — Z7982 Long term (current) use of aspirin: Secondary | ICD-10-CM | POA: Insufficient documentation

## 2013-08-20 DIAGNOSIS — R5383 Other fatigue: Secondary | ICD-10-CM

## 2013-08-20 DIAGNOSIS — K219 Gastro-esophageal reflux disease without esophagitis: Secondary | ICD-10-CM | POA: Insufficient documentation

## 2013-08-20 DIAGNOSIS — R5381 Other malaise: Secondary | ICD-10-CM | POA: Insufficient documentation

## 2013-08-20 DIAGNOSIS — Z86711 Personal history of pulmonary embolism: Secondary | ICD-10-CM | POA: Insufficient documentation

## 2013-08-20 DIAGNOSIS — R0600 Dyspnea, unspecified: Secondary | ICD-10-CM

## 2013-08-20 DIAGNOSIS — R0602 Shortness of breath: Secondary | ICD-10-CM | POA: Insufficient documentation

## 2013-08-20 DIAGNOSIS — Z880 Allergy status to penicillin: Secondary | ICD-10-CM | POA: Insufficient documentation

## 2013-08-20 DIAGNOSIS — Z872 Personal history of diseases of the skin and subcutaneous tissue: Secondary | ICD-10-CM | POA: Insufficient documentation

## 2013-08-20 DIAGNOSIS — M129 Arthropathy, unspecified: Secondary | ICD-10-CM | POA: Insufficient documentation

## 2013-08-20 DIAGNOSIS — IMO0002 Reserved for concepts with insufficient information to code with codable children: Secondary | ICD-10-CM | POA: Insufficient documentation

## 2013-08-20 DIAGNOSIS — R000 Tachycardia, unspecified: Secondary | ICD-10-CM | POA: Insufficient documentation

## 2013-08-20 DIAGNOSIS — Z79899 Other long term (current) drug therapy: Secondary | ICD-10-CM | POA: Insufficient documentation

## 2013-08-20 HISTORY — DX: Other pulmonary embolism without acute cor pulmonale: I26.99

## 2013-08-20 HISTORY — DX: Acute embolism and thrombosis of unspecified deep veins of unspecified lower extremity: I82.409

## 2013-08-20 HISTORY — DX: Cellulitis, unspecified: L03.90

## 2013-08-20 HISTORY — DX: Cutaneous abscess, unspecified: L02.91

## 2013-08-20 LAB — BASIC METABOLIC PANEL
BUN: 13 mg/dL (ref 6–23)
CHLORIDE: 98 meq/L (ref 96–112)
CO2: 24 meq/L (ref 19–32)
CREATININE: 0.73 mg/dL (ref 0.50–1.35)
Calcium: 8.6 mg/dL (ref 8.4–10.5)
GFR calc Af Amer: >90 mL/min (ref >90)
GFR calc non Af Amer: >90 mL/min (ref >90)
Glucose, Bld: 182 mg/dL — ABNORMAL HIGH (ref 70–99)
Potassium: 4.4 mEq/L (ref 3.7–5.3)
Sodium: 133 mEq/L — ABNORMAL LOW (ref 137–147)

## 2013-08-20 LAB — URINALYSIS, ROUTINE W REFLEX MICROSCOPIC
Bilirubin Urine: NEGATIVE
GLUCOSE, UA: 100 mg/dL — AB
Hgb urine dipstick: NEGATIVE
KETONES UR: NEGATIVE mg/dL
LEUKOCYTES UA: NEGATIVE
Nitrite: NEGATIVE
PH: 5.5 (ref 5.0–8.0)
Protein, ur: 100 mg/dL — AB
Specific Gravity, Urine: 1.026 (ref 1.005–1.030)
Urobilinogen, UA: 1 mg/dL (ref 0.0–1.0)

## 2013-08-20 LAB — CBC WITH DIFFERENTIAL/PLATELET
BASOS PCT: 0 % (ref 0–1)
Basophils Absolute: 0 10*3/uL (ref 0.0–0.1)
Eosinophils Absolute: 0.1 10*3/uL (ref 0.0–0.7)
Eosinophils Relative: 1 % (ref 0–5)
HEMATOCRIT: 27.5 % — AB (ref 39.0–52.0)
HEMOGLOBIN: 9.2 g/dL — AB (ref 13.0–17.0)
Lymphocytes Relative: 46 % (ref 12–46)
Lymphs Abs: 5.6 10*3/uL — ABNORMAL HIGH (ref 0.7–4.0)
MCH: 32.1 pg (ref 26.0–34.0)
MCHC: 33.5 g/dL (ref 30.0–36.0)
MCV: 95.8 fL (ref 78.0–100.0)
MONO ABS: 0.8 10*3/uL (ref 0.1–1.0)
Monocytes Relative: 7 % (ref 3–12)
NEUTROS ABS: 5.6 10*3/uL (ref 1.7–7.7)
Neutrophils Relative %: 46 % (ref 43–77)
Platelets: 53 10*3/uL — ABNORMAL LOW (ref 150–400)
RBC: 2.87 MIL/uL — ABNORMAL LOW (ref 4.22–5.81)
RDW: 18.2 % — ABNORMAL HIGH (ref 11.5–15.5)
WBC: 12.1 10*3/uL — ABNORMAL HIGH (ref 4.0–10.5)

## 2013-08-20 LAB — PATHOLOGIST SMEAR REVIEW

## 2013-08-20 LAB — URINE MICROSCOPIC-ADD ON

## 2013-08-20 MED ORDER — IOHEXOL 350 MG/ML SOLN
100.0000 mL | Freq: Once | INTRAVENOUS | Status: AC | PRN
  Administered 2013-08-20: 100 mL via INTRAVENOUS

## 2013-08-20 NOTE — ED Notes (Signed)
Otter, MD at bedside.  

## 2013-08-20 NOTE — ED Notes (Signed)
Bed: WA04 Expected date:  Expected time:  Means of arrival:  Comments: EMS/72 yo male with weakness and SOB

## 2013-08-20 NOTE — ED Provider Notes (Signed)
CSN: [id number]     Arrival date & time 08/20/13  0107 History   First MD Initiated Contact with Patient 08/20/13 0136     Chief Complaint  Patient presents with  . Fatigue  . Shortness of Breath   (Consider location/radiation/quality/duration/timing/severity/associated sxs/prior Treatment) HPI 73 year old male presents emergency department from home via EMS with complaint of acute onset of extreme fatigue and shortness of breath.  Patient reports he was laying down around 11:30 in symptoms began.  Patient has history of multiple myeloma and has chronic fatigue.  He also has history of PE and DVT.  No longer on anticoagulants.  Patient reports he had sudden onset of generalized malaise, fatigue, feeling as though he couldn't move his body.  He denies any focal neuro deficits.  Patient currently undergoing treatment for his multiple myeloma, which has worsened.  He has followup on Wednesday with his oncologist.  Patient denies fever, chills, cough, nausea, vomiting, or diarrhea.  No rash. Patient with low O2 sats in upper 80s/low 90s, improved with oxygen, does not normally wear oxygen. Past Medical History  Diagnosis Date  . Arthritis   . GERD (gastroesophageal reflux disease)   . Complication of anesthesia     aspiration during surgery, difficult to wake up  . Multiple myeloma   . Status post radiation therapy 03/23/12 - 04/05/12    T8 - L4/ 20 Gy/ 10 Fractions  . Dysphagia 05/06/12    ED Visit  . DVT (deep venous thrombosis)   . PE (pulmonary embolism)   . Cellulitis   . Abscess     Left arm and groin area   Past Surgical History  Procedure Laterality Date  . Hemorrhoid surgery    . Laparotomy  05/14/2012    Procedure: EXPLORATORY LAPAROTOMY;  Surgeon: Earnstine Regal, MD;  Location: [facility];  Service: General;  Laterality: N/A;  . Colostomy  05/14/2012    Procedure: COLOSTOMY;  Surgeon: Earnstine Regal, MD;  Location: [facility];  Service: General;  Laterality: N/A;  . Back surgery      47  years ago  . Fracture surgery      ankle   Family History  Problem Relation Age of Onset  . Cancer Maternal Grandfather   . Cancer Maternal Grandmother    History  Substance Use Topics  . Smoking status: Former Smoker    Types: Cigarettes    Quit date: 03/09/1978  . Smokeless tobacco: Never Used  . Alcohol Use: No     Comment: 1 glass of wine or a burbon daily., none in the last 3 months    Review of Systems  See History of Present Illness; otherwise all other systems are reviewed and negative Allergies  Penicillins  Home Medications   Current Outpatient Rx  Name  Route  Sig  Dispense  Refill  . aspirin 81 MG tablet   Oral   Take 162 mg by mouth every morning.          . baclofen (LIORESAL) 10 MG tablet   Oral   Take 10 mg by mouth 3 (three) times daily.         Marland Kitchen diltiazem (CARDIZEM CD) 120 MG 24 hr capsule   Oral   Take 120 mg by mouth every morning.         Marland Kitchen doxycycline (VIBRA-TABS) 100 MG tablet   Oral   Take 100 mg by mouth 2 (two) times daily.         Marland Kitchen  lansoprazole (PREVACID) 30 MG capsule   Oral   Take 30 mg by mouth daily at 12 noon.         Marland Kitchen lisinopril (PRINIVIL,ZESTRIL) 2.5 MG tablet   Oral   Take 2.5 mg by mouth daily with breakfast.          . meloxicam (MOBIC) 15 MG tablet   Oral   Take 15 mg by mouth every morning.          . ondansetron (ZOFRAN-ODT) 4 MG disintegrating tablet   Oral   Take 4 mg by mouth every 12 (twelve) hours as needed for nausea.          Marland Kitchen oxyCODONE-acetaminophen (PERCOCET/ROXICET) 5-325 MG per tablet   Oral   Take 1-2 tablets by mouth every 4 (four) hours as needed for moderate pain or severe pain.         . predniSONE (DELTASONE) 20 MG tablet   Oral   Take 20 mg by mouth daily with breakfast. Take 1 pill ady with food.         . Pyridoxine HCl (VITAMIN B-6) 250 MG tablet   Oral   Take 250 mg by mouth every morning.           BP 115/70  Pulse 102  Temp(Src) 98.3 F (36.8 C) (Oral)   Resp 12  SpO2 97% Physical Exam  Nursing note and vitals reviewed. Constitutional: He is oriented to person, place, and time.  Chronically ill appearing male, no acute distress  HENT:  Head: Normocephalic and atraumatic.  Dry mucous membranes  Eyes: Conjunctivae and EOM are normal. Pupils are equal, round, and reactive to light.  Neck: Normal range of motion. Neck supple. No JVD present. No tracheal deviation present. No thyromegaly present.  Cardiovascular: Regular rhythm, normal heart sounds and intact distal pulses.  Exam reveals no gallop and no friction rub.   No murmur heard. Mild tachycardia noted  Pulmonary/Chest: No stridor.  Abdominal: Soft. Bowel sounds are normal. He exhibits no distension and no mass. There is no tenderness. There is no rebound and no guarding.  Colostomy bag in place.  No signs of surrounding erythema or induration  Musculoskeletal: He exhibits no edema and no tenderness.  Lymphadenopathy:    He has no cervical adenopathy.  Neurological: He is alert and oriented to person, place, and time. He displays normal reflexes. No cranial nerve deficit. He exhibits normal muscle tone. Coordination normal.  Skin: Skin is warm and dry. Rash (patient has extremely dry skin, especially in lower extremities with cracking, no induration, erythema noted) noted. No erythema. No pallor.  Psychiatric: He has a normal mood and affect. His behavior is normal. Judgment and thought content normal.    ED Course  Procedures (including critical care time) Labs Review Labs Reviewed  CBC WITH DIFFERENTIAL - Abnormal; Notable for the following:    WBC 12.1 (*)    RBC 2.87 (*)    Hemoglobin 9.2 (*)    HCT 27.5 (*)    RDW 18.2 (*)    Platelets 53 (*)    Lymphs Abs 5.6 (*)    All other components within normal limits  BASIC METABOLIC PANEL - Abnormal; Notable for the following:    Sodium 133 (*)    Glucose, Bld 182 (*)    All other components within normal limits   URINALYSIS, ROUTINE W REFLEX MICROSCOPIC - Abnormal; Notable for the following:    Color, Urine AMBER (*)    APPearance CLOUDY (*)  Glucose, UA 100 (*)    Protein, ur 100 (*)    All other components within normal limits  URINE MICROSCOPIC-ADD ON  PATHOLOGIST SMEAR REVIEW   Imaging Review Ct Angio Chest Pe W/cm &/or Wo Cm  08/20/2013   CLINICAL DATA:  Shortness of breath and fatigue. History of DVT. Multiple myeloma.  EXAM: CT ANGIOGRAPHY CHEST WITH CONTRAST  TECHNIQUE: Multidetector CT imaging of the chest was performed using the standard protocol during bolus administration of intravenous contrast. Multiplanar CT image reconstructions including MIPs were obtained to evaluate the vascular anatomy.  CONTRAST:  142mL OMNIPAQUE IOHEXOL 350 MG/ML SOLN  COMPARISON:  05/13/2012  FINDINGS: THORACIC INLET/BODY WALL:  Enlargement of the left lobe thyroid gland with coarse calcifications, stable from 2013.  MEDIASTINUM:  Normal heart size. No pericardial effusion. Multi focal coronary artery atherosclerosis. No acute vascular abnormality, including pulmonary embolism or aortic dissection. No adenopathy.  LUNG WINDOWS:  No consolidation.  No effusion.  No suspicious pulmonary nodule.  UPPER ABDOMEN:  No acute findings.  OSSEOUS:  There are scattered lytic lesions that the imaged skeleton, consistent with myeloma. No acute fracture appreciated. The most notable findings in the thoracic spine is extensive osteolysis of the T9, T10, and T11 bodies, encompassing previously placed bone cement. There is increasing paravertebral soft tissue mass, bilaterally T9-10 and on the left at T8. The nodule at the level of T8 measures 18 mm. Although anemic, the hemo below levels are not as low as would be expected for extramedullary hematopoiesis. Additionally, the paraspinal abnormalities are limited to levels with definite myeloma this involvement of the bone. Question new growth into the posterior canal at the level of T10.  No acute fracture appreciated. Other notable lytic areas within the manubrium and T3 body.  Review of the MIP images confirms the above findings.  IMPRESSION: 1. Negative for pulmonary embolism or other acute intrathoracic abnormality. 2. Multiple myeloma. Compared to thoracic spine MRI 07/27/2013, new/larger paravertebral extension at the level of T8 and T9/10. If there are new thoracic cord symptoms, MRI could re-evaluate the spinal canal at T10.   Electronically Signed   By: Jorje Guild M.D.   On: 08/20/2013 03:41    EKG Interpretation    Date/Time:  Monday August 20 2013 01:47:17 EST Ventricular Rate:  97 PR Interval:  163 QRS Duration: 67 QT Interval:  330 QTC Calculation: 419 R Axis:   22 Text Interpretation:  Sinus rhythm Abnormal R-wave progression, early transition Since last tracing rate slower Confirmed by Kamelia Lampkins  MD, Ryszard Socarras ([id number]) on 08/20/2013 2:05:35 AM            MDM   1. Fatigue   2. Dyspnea    73 year old male with generalized fatigue, and new oxygen requirement.  He has prior history of PE.  He is no longer on anticoagulant therapy.  I am concerned that he may have recurrent  PE.  Area.  Labs unremarkable, no signs of hypercalcemia.  No UTI.  Plan for CT angio chest   4:29 AM No PE noted.  Unclear etiology for symptoms, may be multifactorial.  To f/u with his oncologist on Wed   Meridian Scherger M Jenner Rosier, MD 08/20/13 [id number]

## 2013-08-20 NOTE — Discharge Instructions (Signed)
Your workup today has not had a specific cause for your symptoms.  Please followup as scheduled with your oncologist.  Return emergency room for worsening condition or new concerning symptoms.   Fatigue Fatigue is a feeling of tiredness, lack of energy, lack of motivation, or feeling tired all the time. Having enough rest, good nutrition, and reducing stress will normally reduce fatigue. Consult your caregiver if it persists. The nature of your fatigue will help your caregiver to find out its cause. The treatment is based on the cause.  CAUSES  There are many causes for fatigue. Most of the time, fatigue can be traced to one or more of your habits or routines. Most causes fit into one or more of three general areas. They are: Lifestyle problems  Sleep disturbances.  Overwork.  Physical exertion.  Unhealthy habits.  Poor eating habits or eating disorders.  Alcohol and/or drug use .  Lack of proper nutrition (malnutrition). Psychological problems  Stress and/or anxiety problems.  Depression.  Grief.  Boredom. Medical Problems or Conditions  Anemia.  Pregnancy.  Thyroid gland problems.  Recovery from major surgery.  Continuous pain.  Emphysema or asthma that is not well controlled  Allergic conditions.  Diabetes.  Infections (such as mononucleosis).  Obesity.  Sleep disorders, such as sleep apnea.  Heart failure or other heart-related problems.  Cancer.  Kidney disease.  Liver disease.  Effects of certain medicines such as antihistamines, cough and cold remedies, prescription pain medicines, heart and blood pressure medicines, drugs used for treatment of cancer, and some antidepressants. SYMPTOMS  The symptoms of fatigue include:   Lack of energy.  Lack of drive (motivation).  Drowsiness.  Feeling of indifference to the surroundings. DIAGNOSIS  The details of how you feel help guide your caregiver in finding out what is causing the fatigue. You  will be asked about your present and past health condition. It is important to review all medicines that you take, including prescription and non-prescription items. A thorough exam will be done. You will be questioned about your feelings, habits, and normal lifestyle. Your caregiver may suggest blood tests, urine tests, or other tests to look for common medical causes of fatigue.  TREATMENT  Fatigue is treated by correcting the underlying cause. For example, if you have continuous pain or depression, treating these causes will improve how you feel. Similarly, adjusting the dose of certain medicines will help in reducing fatigue.  HOME CARE INSTRUCTIONS   Try to get the required amount of good sleep every night.  Eat a healthy and nutritious diet, and drink enough water throughout the day.  Practice ways of relaxing (including yoga or meditation).  Exercise regularly.  Make plans to change situations that cause stress. Act on those plans so that stresses decrease over time. Keep your work and personal routine reasonable.  Avoid street drugs and minimize use of alcohol.  Start taking a daily multivitamin after consulting your caregiver. SEEK MEDICAL CARE IF:   You have persistent tiredness, which cannot be accounted for.  You have fever.  You have unintentional weight loss.  You have headaches.  You have disturbed sleep throughout the night.  You are feeling sad.  You have constipation.  You have dry skin.  You have gained weight.  You are taking any new or different medicines that you suspect are causing fatigue.  You are unable to sleep at night.  You develop any unusual swelling of your legs or other parts of your body. SEEK IMMEDIATE  MEDICAL CARE IF:   You are feeling confused.  Your vision is blurred.  You feel faint or pass out.  You develop severe headache.  You develop severe abdominal, pelvic, or back pain.  You develop chest pain, shortness of breath,  or an irregular or fast heartbeat.  You are unable to pass a normal amount of urine.  You develop abnormal bleeding such as bleeding from the rectum or you vomit blood.  You have thoughts about harming yourself or committing suicide.  You are worried that you might harm someone else. MAKE SURE YOU:   Understand these instructions.  Will watch your condition.  Will get help right away if you are not doing well or get worse. Document Released: 05/23/2007 Document Revised: 10/18/2011 Document Reviewed: 05/23/2007 [facility] Patient Information 2014 [place].  Shortness of Breath Shortness of breath means you have trouble breathing. Shortness of breath may indicate that you have a medical problem. You should seek immediate medical care for shortness of breath. CAUSES   Not enough oxygen in the air (as with high altitudes or a smoke-filled room).  Short-term (acute) lung disease, including:  Infections, such as pneumonia.  Fluid in the lungs, such as heart failure.  A blood clot in the lungs (pulmonary embolism).  Long-term (chronic) lung diseases.  Heart disease (heart attack, angina, heart failure, and others).  Low red blood cells (anemia).  Poor physical fitness. This can cause shortness of breath when you exercise.  Chest or back injuries or stiffness.  Being overweight.  Smoking.  Anxiety. This can make you feel like you are not getting enough air. DIAGNOSIS  Serious medical problems can usually be found during your physical exam. Tests may also be done to determine why you are having shortness of breath. Tests may include:  Chest X-rays.  Lung function tests.  Blood tests.  Electrocardiography.  Exercise testing.  Echocardiography.  Imaging scans. Your caregiver may not be able to find a cause for your shortness of breath after your exam. In this case, it is important to have a follow-up exam with your caregiver as directed.  TREATMENT    Treatment for shortness of breath depends on the cause of your symptoms and can vary greatly. HOME CARE INSTRUCTIONS   Do not smoke. Smoking is a common cause of shortness of breath. If you smoke, ask for help to quit.  Avoid being around chemicals or things that may bother your breathing, such as paint fumes and dust.  Rest as needed. Slowly resume your usual activities.  If medicines were prescribed, take them as directed for the full length of time directed. This includes oxygen and any inhaled medicines.  Keep all follow-up appointments as directed by your caregiver. SEEK MEDICAL CARE IF:   Your condition does not improve in the time expected.  You have a hard time doing your normal activities even with rest.  You have any side effects or problems with the medicines prescribed.  You develop any new symptoms. SEEK IMMEDIATE MEDICAL CARE IF:   Your shortness of breath gets worse.  You feel lightheaded, faint, or develop a cough not controlled with medicines.  You start coughing up blood.  You have pain with breathing.  You have chest pain or pain in your arms, shoulders, or abdomen.  You have a fever.  You are unable to walk up stairs or exercise the way you normally do. MAKE SURE YOU:  Understand these instructions.  Will watch your condition.  Will get  help right away if you are not doing well or get worse. Document Released: 04/20/2001 Document Revised: 01/25/2012 Document Reviewed: 10/11/2011 [facility] Patient Information 2014 [place].

## 2013-08-20 NOTE — ED Notes (Signed)
Pt able to ambulate with a stand by assist with no difficutly.  Pt able to maintain O2 sat at 93%-94%.  Dr. Sharol Given made aware.

## 2013-08-20 NOTE — Telephone Encounter (Signed)
[employer] utilization authorization notification APPROVED valid 03/14/2013-08/08/2038.  POMALYST  Ref Nbr: [id number]    COPY SCANNED

## 2013-08-20 NOTE — ED Notes (Signed)
Per EMS report: pt from home: pt c/o of acute onset fatigue that began a 23:30.  Pt is normally fatigued d/t multiple mylemoa and the associated treatments but tonight the feeling of fatigue was more pronounced.  Pt reports difficulty in lifting limbs.  Pt also reports of episodes of shortness of breath and O2 sats on RA came down to 91/92%.  EMS put pt on 2L [place] and pt has maintained an O2 sat of 96%. Pt hx of PE, DVT.  Lung sounds clear.  Pt a/o x 4.  CBG: 138.  EMS stroke screen negative.

## 2013-08-21 ENCOUNTER — Other Ambulatory Visit: Payer: Self-pay | Admitting: Hematology & Oncology

## 2013-08-21 ENCOUNTER — Ambulatory Visit (HOSPITAL_COMMUNITY)
Admission: RE | Admit: 2013-08-21 | Discharge: 2013-08-21 | Disposition: A | Discharge status: Home / Self Care | Payer: BC Managed Care – PPO | Source: Ambulatory Visit | Attending: Hematology & Oncology | Admitting: Hematology & Oncology

## 2013-08-21 ENCOUNTER — Encounter (HOSPITAL_COMMUNITY): Payer: Self-pay

## 2013-08-21 ENCOUNTER — Other Ambulatory Visit: Payer: Self-pay | Admitting: Nurse Practitioner

## 2013-08-21 DIAGNOSIS — M899 Disorder of bone, unspecified: Secondary | ICD-10-CM | POA: Insufficient documentation

## 2013-08-21 DIAGNOSIS — Z923 Personal history of irradiation: Secondary | ICD-10-CM | POA: Insufficient documentation

## 2013-08-21 DIAGNOSIS — Z87891 Personal history of nicotine dependence: Secondary | ICD-10-CM | POA: Insufficient documentation

## 2013-08-21 DIAGNOSIS — Z86718 Personal history of other venous thrombosis and embolism: Secondary | ICD-10-CM | POA: Insufficient documentation

## 2013-08-21 DIAGNOSIS — Z933 Colostomy status: Secondary | ICD-10-CM | POA: Insufficient documentation

## 2013-08-21 DIAGNOSIS — K219 Gastro-esophageal reflux disease without esophagitis: Secondary | ICD-10-CM | POA: Insufficient documentation

## 2013-08-21 DIAGNOSIS — C9000 Multiple myeloma not having achieved remission: Secondary | ICD-10-CM

## 2013-08-21 DIAGNOSIS — E049 Nontoxic goiter, unspecified: Secondary | ICD-10-CM | POA: Insufficient documentation

## 2013-08-21 DIAGNOSIS — Z79899 Other long term (current) drug therapy: Secondary | ICD-10-CM | POA: Insufficient documentation

## 2013-08-21 DIAGNOSIS — I2510 Atherosclerotic heart disease of native coronary artery without angina pectoris: Secondary | ICD-10-CM | POA: Insufficient documentation

## 2013-08-21 DIAGNOSIS — Z7982 Long term (current) use of aspirin: Secondary | ICD-10-CM | POA: Insufficient documentation

## 2013-08-21 DIAGNOSIS — Z86711 Personal history of pulmonary embolism: Secondary | ICD-10-CM | POA: Insufficient documentation

## 2013-08-21 DIAGNOSIS — M949 Disorder of cartilage, unspecified: Secondary | ICD-10-CM

## 2013-08-21 LAB — CBC
HCT: 26.6 % — ABNORMAL LOW (ref 39.0–52.0)
Hemoglobin: 9.1 g/dL — ABNORMAL LOW (ref 13.0–17.0)
MCH: 32.4 pg (ref 26.0–34.0)
MCHC: 34.2 g/dL (ref 30.0–36.0)
MCV: 94.7 fL (ref 78.0–100.0)
PLATELETS: 51 10*3/uL — AB (ref 150–400)
RBC: 2.81 MIL/uL — ABNORMAL LOW (ref 4.22–5.81)
RDW: 18.8 % — ABNORMAL HIGH (ref 11.5–15.5)
WBC: 14.9 10*3/uL — ABNORMAL HIGH (ref 4.0–10.5)

## 2013-08-21 LAB — PROTIME-INR
INR: 1.01 (ref 0.00–1.49)
PROTHROMBIN TIME: 13.1 s (ref 11.6–15.2)

## 2013-08-21 LAB — APTT: APTT: 28 s (ref 24–37)

## 2013-08-21 MED ORDER — MIDAZOLAM HCL 2 MG/2ML IJ SOLN
INTRAMUSCULAR | Status: AC | PRN
  Administered 2013-08-21 (×3): 1 mg via INTRAVENOUS

## 2013-08-21 MED ORDER — SODIUM CHLORIDE 0.9 % IV SOLN
INTRAVENOUS | Status: DC
  Administered 2013-08-21: 13:00:00 via INTRAVENOUS

## 2013-08-21 MED ORDER — FENTANYL CITRATE 0.05 MG/ML IJ SOLN
INTRAMUSCULAR | Status: AC
  Filled 2013-08-21: qty 6

## 2013-08-21 MED ORDER — LIDOCAINE-EPINEPHRINE (PF) 2 %-1:200000 IJ SOLN
INTRAMUSCULAR | Status: AC
  Filled 2013-08-21: qty 20

## 2013-08-21 MED ORDER — FENTANYL CITRATE 0.05 MG/ML IJ SOLN
INTRAMUSCULAR | Status: AC | PRN
  Administered 2013-08-21 (×2): 50 ug via INTRAVENOUS

## 2013-08-21 MED ORDER — MIDAZOLAM HCL 2 MG/2ML IJ SOLN
INTRAMUSCULAR | Status: AC
  Filled 2013-08-21: qty 6

## 2013-08-21 MED ORDER — HEPARIN SOD (PORK) LOCK FLUSH 100 UNIT/ML IV SOLN
INTRAVENOUS | Status: AC
  Filled 2013-08-21: qty 5

## 2013-08-21 MED ORDER — HEPARIN SOD (PORK) LOCK FLUSH 100 UNIT/ML IV SOLN
INTRAVENOUS | Status: AC | PRN
  Administered 2013-08-21: 500 [IU]

## 2013-08-21 MED ORDER — LIDOCAINE-PRILOCAINE 2.5-2.5 % EX CREA: 1.0000 "application " | TOPICAL_CREAM | CUTANEOUS | Status: DC | PRN

## 2013-08-21 MED ORDER — VANCOMYCIN HCL IN DEXTROSE 1-5 GM/200ML-% IV SOLN
1000.0000 mg | INTRAVENOUS | Status: AC
  Administered 2013-08-21: 1000 mg via INTRAVENOUS
  Filled 2013-08-21: qty 200

## 2013-08-21 NOTE — Discharge Instructions (Signed)
Moderate Sedation, Adult °Moderate sedation is given to help you relax or even sleep through a procedure. You may remain sleepy, be clumsy, or have poor balance for several hours following this procedure. Arrange for a responsible adult, family member, or friend to take you home. A responsible adult should stay with you for at least 24 hours or until the medicines have worn off. °· Do not participate in any activities where you could become injured for the next 24 hours, or until you feel normal again. Do not: °· Drive. °· Swim. °· Ride a bicycle. °· Operate heavy machinery. °· Cook. °· Use power tools. °· Climb ladders. °· Work at heights. °· Do not make important decisions or sign legal documents until you are improved. °· Vomiting may occur if you eat too soon. When you can drink without vomiting, try water, juice, or soup. Try solid foods if you feel little or no nausea. °· Only take over-the-counter or prescription medications for pain, discomfort, or fever as directed by your caregiver.If pain medications have been prescribed for you, ask your caregiver how soon it is safe to take them. °· Make sure you and your family fully understands everything about the medication given to you. Make sure you understand what side effects may occur. °· You should not drink alcohol, take sleeping pills, or medications that cause drowsiness for at least 24 hours. °· If you smoke, do not smoke alone. °· If you are feeling better, you may resume normal activities 24 hours after receiving sedation. °· Keep all appointments as scheduled. Follow all instructions. °· Ask questions if you do not understand. °SEEK MEDICAL CARE IF:  °· Your skin is pale or bluish in color. °· You continue to feel sick to your stomach (nauseous) or throw up (vomit). °· Your pain is getting worse and not helped by medication. °· You have bleeding or swelling. °· You are still sleepy or feeling clumsy after 24 hours. °SEEK IMMEDIATE MEDICAL CARE IF:   °· You develop a rash. °· You have difficulty breathing. °· You develop any type of allergic problem. °· You have a fever. °Document Released: 04/20/2001 Document Revised: 10/18/2011 Document Reviewed: 04/02/2013 °ExitCare® Patient Information ©2014 [employer]. °Implanted Port Home Guide °An implanted port is a type of central line that is placed under the skin. Central lines are used to provide IV access when treatment or nutrition needs to be given through a person's veins. Implanted ports are used for long-term IV access. An implanted port may be placed because:  °· You need IV medicine that would be irritating to the small veins in your hands or arms.   °· You need long-term IV medicines, such as antibiotics.   °· You need IV nutrition for a long period.   °· You need frequent blood draws for lab tests.   °· You need dialysis.   °Implanted ports are usually placed in the chest area, but they can also be placed in the upper arm, the abdomen, or the leg. An implanted port has two main parts:  °· Reservoir. The reservoir is round and will appear as a small, raised area under your skin. The reservoir is the part where a needle is inserted to give medicines or draw blood.   °· Catheter. The catheter is a thin, flexible tube that extends from the reservoir. The catheter is placed into a large vein. Medicine that is inserted into the reservoir goes into the catheter and then into the vein.   °HOW WILL I CARE FOR MY   INCISION SITE? °Do not get the incision site wet. Bathe or shower as directed by your health care provider.  °HOW IS MY PORT ACCESSED? °Special steps must be taken to access the port:  °· Before the port is accessed, a numbing cream can be placed on the skin. This helps numb the skin over the port site.   °· Your health care provider uses a sterile technique to access the port. °· Your health care provider must put on a mask and sterile gloves. °· The skin over your port is cleaned carefully with an  antiseptic and allowed to dry. °· The port is gently pinched between sterile gloves, and a needle is inserted into the port. °· Only "non-coring" port needles should be used to access the port. Once the port is accessed, a blood return should be checked. This helps ensure that the port is in the vein and is not clogged.   °· If your port needs to remain accessed for a constant infusion, a clear (transparent) bandage will be placed over the needle site. The bandage and needle will need to be changed every week, or as directed by your health care provider.   °· Keep the bandage covering the needle clean and dry. Do not get it wet. Follow your health care provider's instructions on how to take a shower or bath while the port is accessed.   °· If your port does not need to stay accessed, no bandage is needed over the port.   °WHAT IS FLUSHING? °Flushing helps keep the port from getting clogged. Follow your health care provider's instructions on how and when to flush the port. Ports are usually flushed with saline solution or a medicine called heparin. The need for flushing will depend on how the port is used.  °· If the port is used for intermittent medicines or blood draws, the port will need to be flushed:   °· After medicines have been given.   °· After blood has been drawn.   °· As part of routine maintenance.   °· If a constant infusion is running, the port may not need to be flushed.   °HOW LONG WILL MY PORT STAY IMPLANTED? °The port can stay in for as long as your health care provider thinks it is needed. When it is time for the port to come out, surgery will be done to remove it. The procedure is similar to the one performed when the port was put in.  °WHEN SHOULD I SEEK IMMEDIATE MEDICAL CARE? °When you have an implanted port, you should seek immediate medical care if:  °· You notice a bad smell coming from the incision site.   °· You have swelling, redness, or drainage at the incision site.   °· You have more  swelling or pain at the port site or the surrounding area.   °· You have a fever that is not controlled with medicine. °Document Released: 07/26/2005 Document Revised: 05/16/2013 Document Reviewed: 04/02/2013 °ExitCare® Patient Information ©2014 [employer]. ° °

## 2013-08-21 NOTE — H&P (Signed)
Berger Berger is an 73 y.o. male.   Chief Complaint: "I'm getting a port a cath put in" HPI: Patient with progressive multiple myeloma presents today for port a cath placement for chemotherapy.  Past Medical History  Diagnosis Date  . Arthritis   . GERD (gastroesophageal reflux disease)   . Complication of anesthesia     aspiration during surgery, difficult to wake up  . Multiple myeloma   . Status post radiation therapy 03/23/12 - 04/05/12    T8 - L4/ 20 Gy/ 10 Fractions  . Dysphagia 05/06/12    ED Visit  . DVT (deep venous thrombosis)   . PE (pulmonary embolism)   . Cellulitis   . Abscess     Left arm and groin area    Past Surgical History  Procedure Laterality Date  . Hemorrhoid surgery    . Laparotomy  05/14/2012    Procedure: EXPLORATORY LAPAROTOMY;  Surgeon: Berger Regal, MD;  Location: [facility];  Service: General;  Laterality: N/A;  . Colostomy  05/14/2012    Procedure: COLOSTOMY;  Surgeon: Berger Regal, MD;  Location: [facility];  Service: General;  Laterality: N/A;  . Back surgery      47 years ago  . Fracture surgery      ankle    Family History  Problem Relation Age of Onset  . Cancer Maternal Grandfather   . Cancer Maternal Grandmother    Social History:  reports that he quit smoking about 35 years ago. His smoking use included Cigarettes. He smoked 0.00 packs per day. He has never used smokeless tobacco. He reports that he does not drink alcohol or use illicit drugs.  Allergies:  Allergies  Allergen Reactions  . Penicillins Nausea And Vomiting, Swelling and Rash    Current outpatient prescriptions:aspirin 81 MG tablet, Take 162 mg by mouth every morning. , Disp: , Rfl: ;  baclofen (LIORESAL) 10 MG tablet, Take 10 mg by mouth 3 (three) times daily., Disp: , Rfl: ;  diltiazem (CARDIZEM CD) 120 MG 24 hr capsule, Take 120 mg by mouth every morning., Disp: , Rfl: ;  doxycycline (VIBRA-TABS) 100 MG tablet, Take 100 mg by mouth 2 (two) times daily., Disp: , Rfl:   HYDROmorphone (DILAUDID) 2 MG tablet, Take by mouth every 4 (four) hours as needed for severe pain., Disp: , Rfl: ;  lansoprazole (PREVACID) 30 MG capsule, Take 30 mg by mouth daily at 12 noon., Disp: , Rfl: ;  lisinopril (PRINIVIL,ZESTRIL) 2.5 MG tablet, Take 2.5 mg by mouth daily with breakfast. , Disp: , Rfl: ;  meloxicam (MOBIC) 15 MG tablet, Take 15 mg by mouth every morning. , Disp: , Rfl:  oxyCODONE-acetaminophen (PERCOCET/ROXICET) 5-325 MG per tablet, Take 1-2 tablets by mouth every 4 (four) hours as needed for moderate pain or severe pain., Disp: , Rfl: ;  predniSONE (DELTASONE) 20 MG tablet, Take 20 mg by mouth daily with breakfast. Take 1 pill ady with food., Disp: , Rfl: ;  Pyridoxine HCl (VITAMIN B-6) 250 MG tablet, Take 250 mg by mouth every morning. , Disp: , Rfl:  ondansetron (ZOFRAN-ODT) 4 MG disintegrating tablet, Take 4 mg by mouth every 12 (twelve) hours as needed for nausea. , Disp: , Rfl:  Current facility-administered medications:0.9 %  sodium chloride infusion, , Intravenous, Continuous, Robert Kevin Allred, PA-C, Last Rate: 20 mL/hr at 08/21/13 1256;  vancomycin (VANCOCIN) IVPB 1000 mg/200 mL premix, 1,000 mg, Intravenous, On Call, Robert Rowe Robert, PA-C   Results for orders  placed during the hospital encounter of 08/20/13 (from the past 48 hour(s))  CBC WITH DIFFERENTIAL     Status: Abnormal   Collection Time    08/20/13  1:50 AM      Result Value Range   WBC 12.1 (*) 4.0 - 10.5 K/uL   RBC 2.87 (*) 4.22 - 5.81 MIL/uL   Hemoglobin 9.2 (*) 13.0 - 17.0 g/dL   HCT 27.5 (*) 39.0 - 52.0 %   MCV 95.8  78.0 - 100.0 fL   MCH 32.1  26.0 - 34.0 pg   MCHC 33.5  30.0 - 36.0 g/dL   RDW 18.2 (*) 11.5 - 15.5 %   Platelets 53 (*) 150 - 400 K/uL   Comment: REPEATED TO VERIFY     SPECIMEN CHECKED FOR CLOTS     PLATELET COUNT CONFIRMED BY SMEAR   Neutrophils Relative % 46  43 - 77 %   Lymphocytes Relative 46  12 - 46 %   Monocytes Relative 7  3 - 12 %   Eosinophils Relative 1  0 - 5 %    Basophils Relative 0  0 - 1 %   Neutro Abs 5.6  1.7 - 7.7 K/uL   Lymphs Abs 5.6 (*) 0.7 - 4.0 K/uL   Monocytes Absolute 0.8  0.1 - 1.0 K/uL   Eosinophils Absolute 0.1  0.0 - 0.7 K/uL   Basophils Absolute 0.0  0.0 - 0.1 K/uL   RBC Morphology POLYCHROMASIA PRESENT     Comment: RARE NRBCs   WBC Morphology MILD LEFT SHIFT (1-5% METAS, OCC MYELO, OCC BANDS)     Comment: ATYPICAL LYMPHOCYTES     ATYPICAL MONONUCLEAR CELLS  BASIC METABOLIC PANEL     Status: Abnormal   Collection Time    08/20/13  1:50 AM      Result Value Range   Sodium 133 (*) 137 - 147 mEq/L   Potassium 4.4  3.7 - 5.3 mEq/L   Chloride 98  96 - 112 mEq/L   CO2 24  19 - 32 mEq/L   Glucose, Bld 182 (*) 70 - 99 mg/dL   BUN 13  6 - 23 mg/dL   Creatinine, Ser 0.73  0.50 - 1.35 mg/dL   Calcium 8.6  8.4 - 10.5 mg/dL   GFR calc non Af Amer >90  >90 mL/min   GFR calc Af Amer >90  >90 mL/min   Comment: (NOTE)     The eGFR has been calculated using the CKD EPI equation.     This calculation has not been validated in all clinical situations.     eGFR's persistently <90 mL/min signify possible Chronic Kidney     Disease.  PATHOLOGIST SMEAR REVIEW     Status: None   Collection Time    08/20/13  1:50 AM      Result Value Range   Path Review Reviewed By Berger Berger, M.Robert.     Comment: 01.12.15     ABUNDANT CIRCULATING PLASMA CYTOID CELLS.     IMMATURE MONONUCLEAR CELLS.  URINALYSIS, ROUTINE W REFLEX MICROSCOPIC     Status: Abnormal   Collection Time    08/20/13  2:04 AM      Result Value Range   Color, Urine AMBER (*) YELLOW   Comment: BIOCHEMICALS MAY BE AFFECTED BY COLOR   APPearance CLOUDY (*) CLEAR   Specific Gravity, Urine 1.026  1.005 - 1.030   pH 5.5  5.0 - 8.0   Glucose, UA 100 (*) NEGATIVE mg/dL  Hgb urine dipstick NEGATIVE  NEGATIVE   Bilirubin Urine NEGATIVE  NEGATIVE   Ketones, ur NEGATIVE  NEGATIVE mg/dL   Protein, ur 100 (*) NEGATIVE mg/dL   Urobilinogen, UA 1.0  0.0 - 1.0 mg/dL   Nitrite NEGATIVE   NEGATIVE   Leukocytes, UA NEGATIVE  NEGATIVE  URINE MICROSCOPIC-ADD ON     Status: None   Collection Time    08/20/13  2:04 AM      Result Value Range   Squamous Epithelial / LPF RARE  RARE   Urine-Other AMORPHOUS URATES/PHOSPHATES     Comment: MUCOUS PRESENT   Ct Angio Chest Pe W/cm &/or Wo Cm  08/20/2013   CLINICAL DATA:  Shortness of breath and fatigue. History of DVT. Multiple myeloma.  EXAM: CT ANGIOGRAPHY CHEST WITH CONTRAST  TECHNIQUE: Multidetector CT imaging of the chest was performed using the standard protocol during bolus administration of intravenous contrast. Multiplanar CT image reconstructions including MIPs were obtained to evaluate the vascular anatomy.  CONTRAST:  111mL OMNIPAQUE IOHEXOL 350 MG/ML SOLN  COMPARISON:  05/13/2012  FINDINGS: THORACIC INLET/BODY WALL:  Enlargement of the left lobe thyroid gland with coarse calcifications, stable from 2013.  MEDIASTINUM:  Normal heart size. No pericardial effusion. Multi focal coronary artery atherosclerosis. No acute vascular abnormality, including pulmonary embolism or aortic dissection. No adenopathy.  LUNG WINDOWS:  No consolidation.  No effusion.  No suspicious pulmonary nodule.  UPPER ABDOMEN:  No acute findings.  OSSEOUS:  There are scattered lytic lesions that the imaged skeleton, consistent with myeloma. No acute fracture appreciated. The most notable findings in the thoracic spine is extensive osteolysis of the T9, T10, and T11 bodies, encompassing previously placed bone cement. There is increasing paravertebral soft tissue mass, bilaterally T9-10 and on the left at T8. The nodule at the level of T8 measures 18 mm. Although anemic, the hemo below levels are not as low as would be expected for extramedullary hematopoiesis. Additionally, the paraspinal abnormalities are limited to levels with definite myeloma this involvement of the bone. Question new growth into the posterior canal at the level of T10. No acute fracture appreciated.  Other notable lytic areas within the manubrium and T3 body.  Review of the MIP images confirms the above findings.  IMPRESSION: 1. Negative for pulmonary embolism or other acute intrathoracic abnormality. 2. Multiple myeloma. Compared to thoracic spine MRI 07/27/2013, new/larger paravertebral extension at the level of T8 and T9/10. If there are new thoracic cord symptoms, MRI could re-evaluate the spinal canal at T10.   Electronically Signed   By: Jorje Guild M.Robert.   On: 08/20/2013 03:41    Review of Systems  Constitutional: Positive for malaise/fatigue. Negative for fever and chills.  Respiratory: Negative for cough and shortness of breath.   Cardiovascular: Negative for chest pain.  Gastrointestinal: Negative for nausea, vomiting and abdominal pain.  Musculoskeletal: Positive for back pain.  Neurological: Negative for headaches.  Endo/Heme/Allergies: Bruises/bleeds easily.    Blood pressure 115/64, pulse 98, temperature 98.1 F (36.7 C), temperature source Oral, resp. rate 22, SpO2 100.00%. Physical Exam  Constitutional: He is oriented to person, place, and time. He appears well-developed and well-nourished.  Cardiovascular: Normal rate and regular rhythm.   Respiratory: Effort normal and breath sounds normal.  GI: Soft. Bowel sounds are normal. There is no tenderness.  Intact LLQ colostomy  Musculoskeletal: Normal range of motion. He exhibits no edema.  Neurological: He is alert and oriented to person, place, and time.     Assessment/Plan  Patient with progressive multiple myeloma presents today for port a cath placement for chemotherapy. Details/risks of procedure Robert/w pt/wife with their understanding and consent.    Berger,Robert KEVIN 08/21/2013, 1:11 PM

## 2013-08-21 NOTE — Procedures (Signed)
Successful placement of right IJ approach port-a-cath with tip at the superior caval atrial junction. The catheter is ready for immediate use. No immediate post procedural complications. 

## 2013-08-22 ENCOUNTER — Encounter: Payer: Self-pay | Admitting: *Deleted

## 2013-08-22 ENCOUNTER — Other Ambulatory Visit: Payer: BC Managed Care – PPO | Admitting: Lab

## 2013-08-22 ENCOUNTER — Ambulatory Visit: Payer: BC Managed Care – PPO

## 2013-08-22 ENCOUNTER — Other Ambulatory Visit: Payer: Self-pay | Admitting: *Deleted

## 2013-08-22 DIAGNOSIS — C9000 Multiple myeloma not having achieved remission: Secondary | ICD-10-CM

## 2013-08-22 NOTE — Progress Notes (Signed)
Called [facility] to let them know that patient will not be receiving Pomalyst anymore per. Dr. Marin Olp

## 2013-08-22 NOTE — Progress Notes (Signed)
Erroneous encounter

## 2013-08-23 ENCOUNTER — Encounter: Payer: Self-pay | Admitting: Hematology & Oncology

## 2013-08-23 ENCOUNTER — Ambulatory Visit (HOSPITAL_BASED_OUTPATIENT_CLINIC_OR_DEPARTMENT_OTHER): Payer: BC Managed Care – PPO | Admitting: Hematology & Oncology

## 2013-08-23 ENCOUNTER — Encounter: Payer: Self-pay | Admitting: *Deleted

## 2013-08-23 ENCOUNTER — Ambulatory Visit: Payer: BC Managed Care – PPO

## 2013-08-23 ENCOUNTER — Ambulatory Visit (HOSPITAL_BASED_OUTPATIENT_CLINIC_OR_DEPARTMENT_OTHER): Payer: BC Managed Care – PPO

## 2013-08-23 ENCOUNTER — Other Ambulatory Visit (HOSPITAL_BASED_OUTPATIENT_CLINIC_OR_DEPARTMENT_OTHER): Payer: BC Managed Care – PPO | Admitting: Lab

## 2013-08-23 ENCOUNTER — Telehealth: Payer: Self-pay | Admitting: *Deleted

## 2013-08-23 VITALS — BP 131/71 | HR 107 | Temp 97.9°F | Resp 20

## 2013-08-23 DIAGNOSIS — C9002 Multiple myeloma in relapse: Secondary | ICD-10-CM

## 2013-08-23 DIAGNOSIS — M949 Disorder of cartilage, unspecified: Secondary | ICD-10-CM

## 2013-08-23 DIAGNOSIS — C9000 Multiple myeloma not having achieved remission: Secondary | ICD-10-CM

## 2013-08-23 DIAGNOSIS — M899 Disorder of bone, unspecified: Secondary | ICD-10-CM

## 2013-08-23 DIAGNOSIS — M4810 Ankylosing hyperostosis [Forestier], site unspecified: Secondary | ICD-10-CM

## 2013-08-23 DIAGNOSIS — Q998 Other specified chromosome abnormalities: Secondary | ICD-10-CM

## 2013-08-23 DIAGNOSIS — Z5112 Encounter for antineoplastic immunotherapy: Secondary | ICD-10-CM

## 2013-08-23 LAB — CBC WITH DIFFERENTIAL (CANCER CENTER ONLY)
HCT: 27.3 % — ABNORMAL LOW (ref 38.7–49.9)
HGB: 9 g/dL — ABNORMAL LOW (ref 13.0–17.1)
MCH: 31.9 pg (ref 28.0–33.4)
MCHC: 33 g/dL (ref 32.0–35.9)
MCV: 97 fL (ref 82–98)
PLATELETS: 45 10*3/uL — AB (ref 145–400)
RBC: 2.82 10*6/uL — AB (ref 4.20–5.70)
RDW: 19.7 % — AB (ref 11.1–15.7)
WBC: 18.6 10*3/uL — ABNORMAL HIGH (ref 4.0–10.0)

## 2013-08-23 LAB — COMPREHENSIVE METABOLIC PANEL (CC13)
ALT: 34 U/L (ref 0–55)
ANION GAP: 9 meq/L (ref 3–11)
AST: 51 U/L — ABNORMAL HIGH (ref 5–34)
Albumin: 2.5 g/dL — ABNORMAL LOW (ref 3.5–5.0)
Alkaline Phosphatase: 176 U/L — ABNORMAL HIGH (ref 40–150)
BUN: 9.8 mg/dL (ref 7.0–26.0)
CO2: 22 meq/L (ref 22–29)
CREATININE: 0.9 mg/dL (ref 0.7–1.3)
Calcium: 8.8 mg/dL (ref 8.4–10.4)
Chloride: 104 mEq/L (ref 98–109)
GLUCOSE: 153 mg/dL — AB (ref 70–140)
POTASSIUM: 3.9 meq/L (ref 3.5–5.1)
Sodium: 135 mEq/L — ABNORMAL LOW (ref 136–145)
Total Bilirubin: 0.63 mg/dL (ref 0.20–1.20)
Total Protein: 8.9 g/dL — ABNORMAL HIGH (ref 6.4–8.3)

## 2013-08-23 LAB — MANUAL DIFFERENTIAL (CHCC SATELLITE)
ALC: 4.8 10*3/uL — ABNORMAL HIGH (ref 0.9–3.3)
ANC (CHCC MAN DIFF): 6.3 10*3/uL (ref 1.5–6.5)
Band Neutrophils: 3 % (ref 0–10)
LYMPH: 26 % (ref 14–48)
METAMYELOCYTES PCT: 1 % — AB (ref 0–0)
MONO: 6 % (ref 0–13)
MYELOCYTES: 1 % — AB (ref 0–0)
PLT EST ~~LOC~~: DECREASED
Platelet Morphology: NORMAL
SEG: 29 % — ABNORMAL LOW (ref 40–75)

## 2013-08-23 MED ORDER — PROCHLORPERAZINE MALEATE 10 MG PO TABS: 10.0000 mg | ORAL_TABLET | Freq: Four times a day (QID) | ORAL | Status: AC | PRN

## 2013-08-23 MED ORDER — SODIUM CHLORIDE 0.9 % IV SOLN
300.0000 mg/m2 | Freq: Once | INTRAVENOUS | Status: AC
  Administered 2013-08-23: 620 mg via INTRAVENOUS
  Filled 2013-08-23: qty 31

## 2013-08-23 MED ORDER — DEXAMETHASONE SODIUM PHOSPHATE 10 MG/ML IJ SOLN
10.0000 mg | Freq: Once | INTRAMUSCULAR | Status: AC
  Administered 2013-08-23: 10 mg via INTRAVENOUS

## 2013-08-23 MED ORDER — ZOLEDRONIC ACID 4 MG/100ML IV SOLN
4.0000 mg | Freq: Once | INTRAVENOUS | Status: AC
  Administered 2013-08-23: 4 mg via INTRAVENOUS
  Filled 2013-08-23: qty 100

## 2013-08-23 MED ORDER — LORAZEPAM 0.5 MG PO TABS: 0.5000 mg | ORAL_TABLET | Freq: Four times a day (QID) | ORAL | Status: AC | PRN

## 2013-08-23 MED ORDER — SODIUM CHLORIDE 0.9 % IV SOLN
Freq: Once | INTRAVENOUS | Status: AC
  Administered 2013-08-23: 10:00:00 via INTRAVENOUS

## 2013-08-23 MED ORDER — ONDANSETRON 8 MG/50ML IVPB (CHCC)
8.0000 mg | Freq: Once | INTRAVENOUS | Status: AC
  Administered 2013-08-23: 8 mg via INTRAVENOUS

## 2013-08-23 MED ORDER — DEXAMETHASONE SODIUM PHOSPHATE 10 MG/ML IJ SOLN
INTRAMUSCULAR | Status: AC
  Filled 2013-08-23: qty 1

## 2013-08-23 MED ORDER — SODIUM CHLORIDE 0.9 % IJ SOLN
10.0000 mL | INTRAMUSCULAR | Status: DC | PRN
  Filled 2013-08-23: qty 10

## 2013-08-23 MED ORDER — ACYCLOVIR 400 MG PO TABS: 400.0000 mg | ORAL_TABLET | Freq: Every day | ORAL | Status: AC

## 2013-08-23 MED ORDER — ONDANSETRON HCL 8 MG PO TABS: 8.0000 mg | ORAL_TABLET | Freq: Two times a day (BID) | ORAL | Status: AC

## 2013-08-23 MED ORDER — SODIUM CHLORIDE 0.9 % IV SOLN
Freq: Once | INTRAVENOUS | Status: AC
Start: 2013-08-23 — End: 2013-08-23
  Administered 2013-08-23: 10:00:00 via INTRAVENOUS

## 2013-08-23 MED ORDER — SODIUM CHLORIDE 0.9 % IV SOLN
Freq: Once | INTRAVENOUS | Status: AC
  Administered 2013-08-23: 11:00:00 via INTRAVENOUS

## 2013-08-23 MED ORDER — FENTANYL 25 MCG/HR TD PT72: 25.0000 ug | MEDICATED_PATCH | TRANSDERMAL | Status: DC

## 2013-08-23 MED ORDER — HEPARIN SOD (PORK) LOCK FLUSH 100 UNIT/ML IV SOLN
500.0000 [IU] | Freq: Once | INTRAVENOUS | Status: DC | PRN
  Filled 2013-08-23: qty 5

## 2013-08-23 MED ORDER — HYDROMORPHONE HCL PF 4 MG/ML IJ SOLN
INTRAMUSCULAR | Status: AC
  Filled 2013-08-23: qty 1

## 2013-08-23 MED ORDER — DEXTROSE 5 % IV SOLN
60.0000 mg | Freq: Once | INTRAVENOUS | Status: AC
  Administered 2013-08-23: 60 mg via INTRAVENOUS
  Filled 2013-08-23: qty 30

## 2013-08-23 NOTE — Telephone Encounter (Signed)
Called to leukemia and lymphoma society to enroll in patient care assistance.  Enrolled over the phone and was approved prelimarily.  Will fax over paper work to office for patient and office to fill out.

## 2013-08-23 NOTE — Patient Instructions (Signed)
[place] Discharge Instructions for Patients Receiving Chemotherapy  Today you received the following chemotherapy agents Kyprolis,   To help prevent nausea and vomiting after your treatment, we encourage you to take your nausea medication    If you develop nausea and vomiting that is not controlled by your nausea medication, call the clinic.   BELOW ARE SYMPTOMS THAT SHOULD BE REPORTED IMMEDIATELY:  *FEVER GREATER THAN 100.5 F  *CHILLS WITH OR WITHOUT FEVER  NAUSEA AND VOMITING THAT IS NOT CONTROLLED WITH YOUR NAUSEA MEDICATION  *UNUSUAL SHORTNESS OF BREATH  *UNUSUAL BRUISING OR BLEEDING  TENDERNESS IN MOUTH AND THROAT WITH OR WITHOUT PRESENCE OF ULCERS  *URINARY PROBLEMS  *BOWEL PROBLEMS  UNUSUAL RASH Items with * indicate a potential emergency and should be followed up as soon as possible.  Feel free to call the clinic you have any questions or concerns. The clinic phone number is [phone].

## 2013-08-24 ENCOUNTER — Encounter: Payer: Self-pay | Admitting: *Deleted

## 2013-08-24 ENCOUNTER — Ambulatory Visit: Payer: BC Managed Care – PPO

## 2013-08-24 ENCOUNTER — Encounter: Payer: Self-pay | Admitting: Hematology & Oncology

## 2013-08-24 ENCOUNTER — Ambulatory Visit (HOSPITAL_BASED_OUTPATIENT_CLINIC_OR_DEPARTMENT_OTHER): Payer: BC Managed Care – PPO

## 2013-08-24 VITALS — BP 126/77 | HR 110 | Temp 97.2°F | Resp 24

## 2013-08-24 DIAGNOSIS — C9000 Multiple myeloma not having achieved remission: Secondary | ICD-10-CM

## 2013-08-24 DIAGNOSIS — Z5112 Encounter for antineoplastic immunotherapy: Secondary | ICD-10-CM

## 2013-08-24 LAB — KAPPA/LAMBDA LIGHT CHAINS
KAPPA FREE LGHT CHN: 59.9 mg/dL — AB (ref 0.33–1.94)
Kappa:Lambda Ratio: 124.79 — ABNORMAL HIGH (ref 0.26–1.65)
Lambda Free Lght Chn: 0.48 mg/dL — ABNORMAL LOW (ref 0.57–2.63)

## 2013-08-24 MED ORDER — SODIUM CHLORIDE 0.9 % IV SOLN
Freq: Once | INTRAVENOUS | Status: AC
  Administered 2013-08-24: 13:00:00 via INTRAVENOUS

## 2013-08-24 MED ORDER — HYDROMORPHONE HCL PF 2 MG/ML IJ SOLN
INTRAMUSCULAR | Status: AC
  Filled 2013-08-24: qty 1

## 2013-08-24 MED ORDER — HYDROMORPHONE HCL 2 MG PO TABS: 2.0000 mg | ORAL_TABLET | ORAL | Status: AC | PRN

## 2013-08-24 MED ORDER — DEXAMETHASONE SODIUM PHOSPHATE 10 MG/ML IJ SOLN
10.0000 mg | Freq: Once | INTRAMUSCULAR | Status: AC
  Administered 2013-08-24: 10 mg via INTRAVENOUS

## 2013-08-24 MED ORDER — HYDROMORPHONE HCL PF 1 MG/ML IJ SOLN
2.0000 mg | Freq: Once | INTRAMUSCULAR | Status: AC
  Administered 2013-08-24: 2 mg via INTRAVENOUS
  Filled 2013-08-24: qty 2

## 2013-08-24 MED ORDER — ONDANSETRON 8 MG/50ML IVPB (CHCC)
8.0000 mg | Freq: Once | INTRAVENOUS | Status: AC
  Administered 2013-08-24: 8 mg via INTRAVENOUS

## 2013-08-24 MED ORDER — SODIUM CHLORIDE 0.9 % IJ SOLN
10.0000 mL | INTRAMUSCULAR | Status: DC | PRN
  Filled 2013-08-24: qty 10

## 2013-08-24 MED ORDER — ALTEPLASE 2 MG IJ SOLR
2.0000 mg | Freq: Once | INTRAMUSCULAR | Status: DC | PRN
  Filled 2013-08-24: qty 2

## 2013-08-24 MED ORDER — SODIUM CHLORIDE 0.9 % IJ SOLN
3.0000 mL | INTRAMUSCULAR | Status: DC | PRN
  Filled 2013-08-24: qty 10

## 2013-08-24 MED ORDER — DEXAMETHASONE SODIUM PHOSPHATE 10 MG/ML IJ SOLN
INTRAMUSCULAR | Status: AC
  Filled 2013-08-24: qty 1

## 2013-08-24 MED ORDER — SODIUM CHLORIDE 0.9 % IV SOLN
Freq: Once | INTRAVENOUS | Status: AC
  Administered 2013-08-24: 14:00:00 via INTRAVENOUS

## 2013-08-24 MED ORDER — DEXTROSE 5 % IV SOLN
29.0000 mg/m2 | Freq: Once | INTRAVENOUS | Status: AC
  Administered 2013-08-24: 60 mg via INTRAVENOUS
  Filled 2013-08-24: qty 30

## 2013-08-24 MED ORDER — HEPARIN SOD (PORK) LOCK FLUSH 100 UNIT/ML IV SOLN
500.0000 [IU] | Freq: Once | INTRAVENOUS | Status: DC | PRN
  Filled 2013-08-24: qty 5

## 2013-08-24 MED ORDER — HEPARIN SOD (PORK) LOCK FLUSH 100 UNIT/ML IV SOLN
250.0000 [IU] | Freq: Once | INTRAVENOUS | Status: DC | PRN
  Filled 2013-08-24: qty 5

## 2013-08-24 NOTE — Progress Notes (Signed)
This office note has been dictated.

## 2013-08-24 NOTE — Progress Notes (Signed)
[place] Work  Clinical Social Work received follow up call from Pt. Pt had several concerns about finances and medical bills. Pt not currently getting medicare and is on COBRA. He states he has lots of outstanding bills and really needs some assistance sorting this out. He receives his tx in [place] will follow up with him on the best way to assist him.    Loren Racer, LCSW Clinical Social Worker [facility] [place] for [place] Wednesday, Thursday and Friday Phone: [phone] Fax: [phone]

## 2013-08-24 NOTE — Patient Instructions (Signed)
Kaposi's Sarcoma Kaposi's sarcoma is a cancer of vascular tissue. It is caused by a virus called human herpes virus 8 (HHV-8). The tumors of Kaposi's sarcoma appear as red or purple patches or raised bumps (papules) on the skin, mouth, lungs, liver, and gastrointestinal tract. There are 4 types of Kaposi's sarcoma:  Classic Kaposi's sarcoma: A rare, slow-growing skin tumor. It usually affects males of [place] or [place] European Jewish ancestry.  African Kaposi's sarcoma: Occurs frequently among young males in certain countries in [place]. Often, it is a slow-growing tumor. In some cases, the skin tumors invade bone and tissue under the skin.  Immunosuppressive treatment-related Kaposi's sarcoma: May develop in people who are taking immunosuppressive drugs after an organ transplant. It sometimes improves if the medicine is reduced or changed.  AIDS-related Kaposi's sarcoma: Affects people with late-stage HIV/AIDS. It is often a rapidly progressing tumor. It affects the skin, lymph nodes, gastrointestinal tract, lungs, liver, and spleen. The number of cases is decreasing due to highly active antiretroviral therapy (HAART) for HIV/AIDS. CAUSES  It is believed that Kaposi's sarcoma is caused by or has a strong association with HHV-8. Exactly how this virus causes Kaposi's sarcoma is still being researched. Weakening of the body's natural defense system (immune system) appears to be a risk factor for developing Kaposi's sarcoma. Some causes of a weak immune system include:  Taking immunosuppressive drugs.  HIV.  Lymphoma. SYMPTOMS  The first signs are often red, purple, or brown patches, plaques, or nodules on the skin. This often results in a bruise-like appearance. In classic, African, and immunosuppressive treatment-related Kaposi's sarcoma:  Lesions often grow slowly and develop over years.  The lower legs may swell as the disease gets worse.  In some cases, the disease will  spread to other organs. In AIDS-related Kaposi's sarcoma:  The tumor grows rapidly. It often covers large areas, forming tumor-like masses. These tumors start out soft and spongy and often become hard over time. The tumor surface may form open ulcers. These ulcers may become infected.  This disease often affects the mouth, lymph nodes, lungs, liver, spleen, and gastrointestinal tract.  When the tumor involves the lungs, it often causes coughing, shortness of breath, and wheezing. The disease often progresses rapidly in the lungs. It can result in death from respiratory failure.  When the disease involves the bowel, it rarely causes problems. However, if the disease is very advanced, it may cause symptoms of intestinal obstruction (nausea, vomiting, abdominal pain) or bloody stools.  When the lymph nodes are involved, severe swelling of the legs or face can occur. DIAGNOSIS  If your caregiver suspects Kaposi's sarcoma, he or she will ask about your medical history. If you do not know whether you are infected with HIV, your caregiver may recommend an HIV test. Kaposi's sarcoma can be confirmed by taking a tissue sample (biopsy). If you have Kaposi's sarcoma, your caregiver will try to determine how far it has spread by asking you questions and doing a physical exam. PREVENTION  There is no way to prevent the milder forms of Kaposi's sarcoma. The most effective way to avoid AIDS-related Kaposi's sarcoma is to prevent the transmission of HIV. People who are HIV positive can decrease their risk of Kaposi's sarcoma by taking HAART. TREATMENT  There is no cure for Kaposi's sarcoma. It is a lifelong condition. The goal of treatment is to ease symptoms, shrink the tumor, and prevent the disease from getting worse. The type of treatment advised depends on:  The type of Kaposi's sarcoma.  The size of the tumor.  Whether internal organs are involved.  Results of a test to measure the strength of your  immune system (CD4 count).  Your general medical condition. Treatments include:  Surgery to remove cancer cells.  Destroying cancer cells by freezing (cryotherapy).  Using cancer-fighting drugs (chemotherapy).  Using high-energy rays to kill or shrink tumors (radiation therapy).  Using the body's immune system to fight the cancer cells (biological therapy). In people with AIDS, the lesions will improve with treatment of the AIDS itself. Typically, this involves antiretroviral medicines prescribed to improve the functioning of the immune system. SEEK MEDICAL CARE IF:   You have skin changes that fit the description of Kaposi's sarcoma.  You have symptoms suggestive of HIV/AIDS, including:  Swollen lymph nodes.  Night sweats.  Fever.  Fatigue.  Weight loss. FOR MORE INFORMATION  American Cancer Society: www.cancer.[place]: www.cancer.gov Document Released: 07/01/2004 Document Revised: 03/28/2013 Document Reviewed: 11/12/2009 [facility] Patient Information 2014 [place].

## 2013-08-24 NOTE — Progress Notes (Signed)
[place] Psychosocial Distress Screening Clinical Social Work  Clinical Social Work was referred by distress screening protocol.  The patient scored a 5 on the Psychosocial Distress Thermometer which indicates moderate distress. Clinical Social Worker attempted to phone Pt to assess for distress and other psychosocial needs. CSW spoke with wife and left message for Pt to contact CSW.    Clinical Social Worker follow up needed: yes  If yes, follow up plan: CSW awaits Pt's call and will try to see on future visits.  Loren Racer, LCSW Clinical Social Worker [facility] [place] [facility] [place] Wednesday, Thursday and Friday Phone: [phone] Fax: [phone]

## 2013-08-25 ENCOUNTER — Emergency Department (HOSPITAL_BASED_OUTPATIENT_CLINIC_OR_DEPARTMENT_OTHER): Payer: Medicare Other

## 2013-08-25 ENCOUNTER — Encounter (HOSPITAL_BASED_OUTPATIENT_CLINIC_OR_DEPARTMENT_OTHER): Payer: Self-pay | Admitting: Emergency Medicine

## 2013-08-25 ENCOUNTER — Inpatient Hospital Stay (HOSPITAL_BASED_OUTPATIENT_CLINIC_OR_DEPARTMENT_OTHER)
Admission: EM | Admit: 2013-08-25 | Discharge: 2013-09-03 | DRG: 208 | Disposition: A | Discharge status: Home / Self Care | Payer: Medicare Other | Attending: Internal Medicine | Admitting: Internal Medicine

## 2013-08-25 DIAGNOSIS — E119 Type 2 diabetes mellitus without complications: Secondary | ICD-10-CM

## 2013-08-25 DIAGNOSIS — Z87891 Personal history of nicotine dependence: Secondary | ICD-10-CM

## 2013-08-25 DIAGNOSIS — M949 Disorder of cartilage, unspecified: Secondary | ICD-10-CM

## 2013-08-25 DIAGNOSIS — D61810 Antineoplastic chemotherapy induced pancytopenia: Secondary | ICD-10-CM | POA: Diagnosis present

## 2013-08-25 DIAGNOSIS — K219 Gastro-esophageal reflux disease without esophagitis: Secondary | ICD-10-CM

## 2013-08-25 DIAGNOSIS — E669 Obesity, unspecified: Secondary | ICD-10-CM

## 2013-08-25 DIAGNOSIS — E871 Hypo-osmolality and hyponatremia: Secondary | ICD-10-CM

## 2013-08-25 DIAGNOSIS — I2699 Other pulmonary embolism without acute cor pulmonale: Secondary | ICD-10-CM

## 2013-08-25 DIAGNOSIS — G9340 Encephalopathy, unspecified: Secondary | ICD-10-CM | POA: Diagnosis not present

## 2013-08-25 DIAGNOSIS — I2510 Atherosclerotic heart disease of native coronary artery without angina pectoris: Secondary | ICD-10-CM

## 2013-08-25 DIAGNOSIS — D61818 Other pancytopenia: Secondary | ICD-10-CM

## 2013-08-25 DIAGNOSIS — F32A Depression, unspecified: Secondary | ICD-10-CM

## 2013-08-25 DIAGNOSIS — M549 Dorsalgia, unspecified: Secondary | ICD-10-CM

## 2013-08-25 DIAGNOSIS — K5641 Fecal impaction: Secondary | ICD-10-CM

## 2013-08-25 DIAGNOSIS — G8929 Other chronic pain: Secondary | ICD-10-CM | POA: Diagnosis present

## 2013-08-25 DIAGNOSIS — J9601 Acute respiratory failure with hypoxia: Secondary | ICD-10-CM

## 2013-08-25 DIAGNOSIS — F329 Major depressive disorder, single episode, unspecified: Secondary | ICD-10-CM

## 2013-08-25 DIAGNOSIS — M898X9 Other specified disorders of bone, unspecified site: Secondary | ICD-10-CM

## 2013-08-25 DIAGNOSIS — L0291 Cutaneous abscess, unspecified: Secondary | ICD-10-CM

## 2013-08-25 DIAGNOSIS — T451X5A Adverse effect of antineoplastic and immunosuppressive drugs, initial encounter: Secondary | ICD-10-CM | POA: Diagnosis present

## 2013-08-25 DIAGNOSIS — L0390 Cellulitis, unspecified: Secondary | ICD-10-CM

## 2013-08-25 DIAGNOSIS — E010 Iodine-deficiency related diffuse (endemic) goiter: Secondary | ICD-10-CM

## 2013-08-25 DIAGNOSIS — Z86711 Personal history of pulmonary embolism: Secondary | ICD-10-CM

## 2013-08-25 DIAGNOSIS — K6289 Other specified diseases of anus and rectum: Secondary | ICD-10-CM

## 2013-08-25 DIAGNOSIS — M4810 Ankylosing hyperostosis [Forestier], site unspecified: Secondary | ICD-10-CM

## 2013-08-25 DIAGNOSIS — I509 Heart failure, unspecified: Secondary | ICD-10-CM | POA: Diagnosis present

## 2013-08-25 DIAGNOSIS — L03114 Cellulitis of left upper limb: Secondary | ICD-10-CM

## 2013-08-25 DIAGNOSIS — E876 Hypokalemia: Secondary | ICD-10-CM | POA: Diagnosis present

## 2013-08-25 DIAGNOSIS — Z7982 Long term (current) use of aspirin: Secondary | ICD-10-CM

## 2013-08-25 DIAGNOSIS — K449 Diaphragmatic hernia without obstruction or gangrene: Secondary | ICD-10-CM

## 2013-08-25 DIAGNOSIS — Z86718 Personal history of other venous thrombosis and embolism: Secondary | ICD-10-CM

## 2013-08-25 DIAGNOSIS — R109 Unspecified abdominal pain: Secondary | ICD-10-CM

## 2013-08-25 DIAGNOSIS — J9600 Acute respiratory failure, unspecified whether with hypoxia or hypercapnia: Principal | ICD-10-CM | POA: Diagnosis present

## 2013-08-25 DIAGNOSIS — C9000 Multiple myeloma not having achieved remission: Secondary | ICD-10-CM

## 2013-08-25 DIAGNOSIS — J189 Pneumonia, unspecified organism: Secondary | ICD-10-CM

## 2013-08-25 DIAGNOSIS — I5031 Acute diastolic (congestive) heart failure: Secondary | ICD-10-CM

## 2013-08-25 DIAGNOSIS — Z933 Colostomy status: Secondary | ICD-10-CM

## 2013-08-25 DIAGNOSIS — I498 Other specified cardiac arrhythmias: Secondary | ICD-10-CM | POA: Diagnosis present

## 2013-08-25 DIAGNOSIS — K5900 Constipation, unspecified: Secondary | ICD-10-CM

## 2013-08-25 DIAGNOSIS — M899 Disorder of bone, unspecified: Secondary | ICD-10-CM

## 2013-08-25 DIAGNOSIS — Z923 Personal history of irradiation: Secondary | ICD-10-CM

## 2013-08-25 DIAGNOSIS — R5381 Other malaise: Secondary | ICD-10-CM

## 2013-08-25 DIAGNOSIS — I10 Essential (primary) hypertension: Secondary | ICD-10-CM

## 2013-08-25 DIAGNOSIS — D696 Thrombocytopenia, unspecified: Secondary | ICD-10-CM

## 2013-08-25 LAB — CBC WITH DIFFERENTIAL/PLATELET
BAND NEUTROPHILS: 19 % — AB (ref 0–10)
Basophils Absolute: 0 10*3/uL (ref 0.0–0.1)
Basophils Relative: 0 % (ref 0–1)
Eosinophils Absolute: 0 10*3/uL (ref 0.0–0.7)
Eosinophils Relative: 0 % (ref 0–5)
HCT: 23.2 % — ABNORMAL LOW (ref 39.0–52.0)
Hemoglobin: 7.7 g/dL — ABNORMAL LOW (ref 13.0–17.0)
LYMPHS ABS: 1.3 10*3/uL (ref 0.7–4.0)
Lymphocytes Relative: 21 % (ref 12–46)
MCH: 31.7 pg (ref 26.0–34.0)
MCHC: 33.2 g/dL (ref 30.0–36.0)
MCV: 95.5 fL (ref 78.0–100.0)
MONO ABS: 1.1 10*3/uL — AB (ref 0.1–1.0)
MYELOCYTES: 2 %
Monocytes Relative: 17 % — ABNORMAL HIGH (ref 3–12)
Neutro Abs: 3.8 10*3/uL (ref 1.7–7.7)
Neutrophils Relative %: 41 % — ABNORMAL LOW (ref 43–77)
Platelets: 19 10*3/uL — CL (ref 150–400)
RBC: 2.43 MIL/uL — ABNORMAL LOW (ref 4.22–5.81)
RDW: 20.6 % — ABNORMAL HIGH (ref 11.5–15.5)
WBC: 6.2 10*3/uL (ref 4.0–10.5)

## 2013-08-25 LAB — COMPREHENSIVE METABOLIC PANEL
ALBUMIN: 2.2 g/dL — AB (ref 3.5–5.2)
ALK PHOS: 123 U/L — AB (ref 39–117)
ALT: 23 U/L (ref 0–53)
AST: 33 U/L (ref 0–37)
BILIRUBIN TOTAL: 0.3 mg/dL (ref 0.3–1.2)
BUN: 30 mg/dL — AB (ref 6–23)
CHLORIDE: 105 meq/L (ref 96–112)
CO2: 24 mEq/L (ref 19–32)
Calcium: 8 mg/dL — ABNORMAL LOW (ref 8.4–10.5)
Creatinine, Ser: 0.9 mg/dL (ref 0.50–1.35)
GFR calc Af Amer: >90 mL/min (ref >90)
GFR calc non Af Amer: 83 mL/min — ABNORMAL LOW (ref >90)
Glucose, Bld: 109 mg/dL — ABNORMAL HIGH (ref 70–99)
Potassium: 4.8 mEq/L (ref 3.7–5.3)
Sodium: 138 mEq/L (ref 137–147)
Total Protein: 7.9 g/dL (ref 6.0–8.3)

## 2013-08-25 LAB — CG4 I-STAT (LACTIC ACID): LACTIC ACID, VENOUS: 2.8 mmol/L — AB (ref 0.5–2.2)

## 2013-08-25 LAB — CBC
HEMATOCRIT: 22.8 % — AB (ref 39.0–52.0)
HEMOGLOBIN: 7.5 g/dL — AB (ref 13.0–17.0)
MCH: 31.4 pg (ref 26.0–34.0)
MCHC: 32.9 g/dL (ref 30.0–36.0)
MCV: 95.4 fL (ref 78.0–100.0)
Platelets: 20 10*3/uL — CL (ref 150–400)
RBC: 2.39 MIL/uL — ABNORMAL LOW (ref 4.22–5.81)
RDW: 20.3 % — AB (ref 11.5–15.5)
WBC: 5.8 10*3/uL (ref 4.0–10.5)

## 2013-08-25 LAB — TROPONIN I: Troponin I: <0.3 ng/mL (ref <0.30)

## 2013-08-25 LAB — RETICULOCYTES
RBC.: 2.45 MIL/uL — AB (ref 4.22–5.81)
Retic Count, Absolute: 31.9 10*3/uL (ref 19.0–186.0)
Retic Ct Pct: 1.3 % (ref 0.4–3.1)

## 2013-08-25 LAB — CREATININE, SERUM
Creatinine, Ser: 0.83 mg/dL (ref 0.50–1.35)
GFR calc Af Amer: >90 mL/min (ref >90)
GFR calc non Af Amer: 86 mL/min — ABNORMAL LOW (ref >90)

## 2013-08-25 LAB — PRO B NATRIURETIC PEPTIDE: Pro B Natriuretic peptide (BNP): 437.4 pg/mL — ABNORMAL HIGH (ref 0–125)

## 2013-08-25 MED ORDER — DEXTROSE 5 % IV SOLN
1.0000 g | Freq: Three times a day (TID) | INTRAVENOUS | Status: DC
  Administered 2013-08-25 – 2013-09-02 (×23): 1 g via INTRAVENOUS
  Filled 2013-08-25 (×26): qty 1

## 2013-08-25 MED ORDER — HYDROMORPHONE HCL PF 1 MG/ML IJ SOLN
1.0000 mg | Freq: Once | INTRAMUSCULAR | Status: AC
  Administered 2013-08-25: 1 mg via INTRAVENOUS
  Filled 2013-08-25: qty 1

## 2013-08-25 MED ORDER — HYDROMORPHONE HCL PF 1 MG/ML IJ SOLN
1.0000 mg | INTRAMUSCULAR | Status: AC | PRN
  Administered 2013-08-25 – 2013-08-26 (×2): 1 mg via INTRAVENOUS
  Filled 2013-08-25 (×2): qty 1

## 2013-08-25 MED ORDER — SODIUM CHLORIDE 0.9 % IV SOLN: INTRAVENOUS | Status: DC

## 2013-08-25 MED ORDER — MORPHINE SULFATE 2 MG/ML IJ SOLN
2.0000 mg | INTRAMUSCULAR | Status: DC | PRN
  Administered 2013-08-26: 2 mg via INTRAVENOUS
  Filled 2013-08-25: qty 1

## 2013-08-25 MED ORDER — ONDANSETRON HCL 4 MG/2ML IJ SOLN: 4.0000 mg | Freq: Four times a day (QID) | INTRAMUSCULAR | Status: DC | PRN

## 2013-08-25 MED ORDER — LIDOCAINE-PRILOCAINE 2.5-2.5 % EX CREA: 1.0000 "application " | TOPICAL_CREAM | CUTANEOUS | Status: DC | PRN

## 2013-08-25 MED ORDER — ASPIRIN EC 81 MG PO TBEC
162.0000 mg | DELAYED_RELEASE_TABLET | Freq: Every morning | ORAL | Status: DC
  Administered 2013-08-26: 162 mg via ORAL
  Filled 2013-08-25: qty 2

## 2013-08-25 MED ORDER — VANCOMYCIN HCL IN DEXTROSE 1-5 GM/200ML-% IV SOLN
1000.0000 mg | Freq: Two times a day (BID) | INTRAVENOUS | Status: DC
  Administered 2013-08-25 – 2013-08-26 (×2): 1000 mg via INTRAVENOUS
  Filled 2013-08-25 (×3): qty 200

## 2013-08-25 MED ORDER — FUROSEMIDE 10 MG/ML IJ SOLN
40.0000 mg | Freq: Once | INTRAMUSCULAR | Status: AC
  Administered 2013-08-25: 40 mg via INTRAVENOUS
  Filled 2013-08-25: qty 4

## 2013-08-25 MED ORDER — DEXTROSE 5 % IV SOLN
2.0000 g | Freq: Once | INTRAVENOUS | Status: DC
  Filled 2013-08-25: qty 2

## 2013-08-25 MED ORDER — ACYCLOVIR 400 MG PO TABS
400.0000 mg | ORAL_TABLET | Freq: Every day | ORAL | Status: DC
  Administered 2013-08-25 – 2013-09-03 (×8): 400 mg via ORAL
  Filled 2013-08-25 (×10): qty 1

## 2013-08-25 MED ORDER — NITROGLYCERIN 2 % TD OINT
0.5000 [in_us] | TOPICAL_OINTMENT | Freq: Four times a day (QID) | TRANSDERMAL | Status: DC
  Administered 2013-08-26 – 2013-08-27 (×6): 0.5 [in_us] via TOPICAL
  Filled 2013-08-25: qty 30

## 2013-08-25 MED ORDER — DEXTROSE 5 % IV SOLN
1.0000 g | Freq: Three times a day (TID) | INTRAVENOUS | Status: DC
  Filled 2013-08-25 (×2): qty 1

## 2013-08-25 MED ORDER — IPRATROPIUM-ALBUTEROL 0.5-2.5 (3) MG/3ML IN SOLN
3.0000 mL | Freq: Four times a day (QID) | RESPIRATORY_TRACT | Status: DC
  Administered 2013-08-26 – 2013-08-27 (×6): 3 mL via RESPIRATORY_TRACT
  Filled 2013-08-25 (×6): qty 3

## 2013-08-25 MED ORDER — PREDNISONE 10 MG PO TABS
10.0000 mg | ORAL_TABLET | Freq: Every day | ORAL | Status: DC
  Administered 2013-08-26: 10 mg via ORAL
  Filled 2013-08-25 (×4): qty 1

## 2013-08-25 MED ORDER — ONDANSETRON HCL 4 MG PO TABS: 4.0000 mg | ORAL_TABLET | Freq: Four times a day (QID) | ORAL | Status: DC | PRN

## 2013-08-25 MED ORDER — PANTOPRAZOLE SODIUM 20 MG PO TBEC
20.0000 mg | DELAYED_RELEASE_TABLET | Freq: Every day | ORAL | Status: DC
  Administered 2013-08-25 – 2013-08-26 (×2): 20 mg via ORAL
  Filled 2013-08-25 (×3): qty 1

## 2013-08-25 MED ORDER — ALBUTEROL SULFATE (2.5 MG/3ML) 0.083% IN NEBU
INHALATION_SOLUTION | RESPIRATORY_TRACT | Status: AC
  Administered 2013-08-25: 2.5 mg
  Filled 2013-08-25: qty 3

## 2013-08-25 MED ORDER — HYDROMORPHONE HCL 2 MG PO TABS
2.0000 mg | ORAL_TABLET | ORAL | Status: DC | PRN
  Administered 2013-08-26: 2 mg via ORAL
  Filled 2013-08-25: qty 1

## 2013-08-25 MED ORDER — DEXTROSE 5 % IV SOLN
500.0000 mg | INTRAVENOUS | Status: DC
  Administered 2013-08-25 – 2013-08-29 (×5): 500 mg via INTRAVENOUS
  Filled 2013-08-25 (×5): qty 500

## 2013-08-25 MED ORDER — LORAZEPAM 0.5 MG PO TABS
0.5000 mg | ORAL_TABLET | Freq: Four times a day (QID) | ORAL | Status: DC | PRN
  Administered 2013-08-25 – 2013-08-27 (×3): 0.5 mg via ORAL
  Filled 2013-08-25 (×3): qty 1

## 2013-08-25 MED ORDER — SODIUM CHLORIDE 0.9 % IJ SOLN
3.0000 mL | Freq: Two times a day (BID) | INTRAMUSCULAR | Status: DC
  Administered 2013-08-26 – 2013-08-27 (×2): 3 mL via INTRAVENOUS
  Administered 2013-08-28: 10 mL via INTRAVENOUS
  Administered 2013-08-28 – 2013-09-01 (×7): 3 mL via INTRAVENOUS

## 2013-08-25 MED ORDER — DILTIAZEM HCL ER COATED BEADS 120 MG PO CP24
120.0000 mg | ORAL_CAPSULE | Freq: Every morning | ORAL | Status: DC
  Administered 2013-08-26: 120 mg via ORAL
  Filled 2013-08-25 (×2): qty 1

## 2013-08-25 MED ORDER — ALUM & MAG HYDROXIDE-SIMETH 200-200-20 MG/5ML PO SUSP: 30.0000 mL | Freq: Four times a day (QID) | ORAL | Status: DC | PRN

## 2013-08-25 MED ORDER — GUAIFENESIN-DM 100-10 MG/5ML PO SYRP
5.0000 mL | ORAL_SOLUTION | ORAL | Status: DC | PRN
  Filled 2013-08-25: qty 10

## 2013-08-25 MED ORDER — SODIUM CHLORIDE 0.9 % IV BOLUS (SEPSIS)
1000.0000 mL | Freq: Once | INTRAVENOUS | Status: AC
  Administered 2013-08-25: 1000 mL via INTRAVENOUS

## 2013-08-25 MED ORDER — HEPARIN SODIUM (PORCINE) 5000 UNIT/ML IJ SOLN
5000.0000 [IU] | Freq: Three times a day (TID) | INTRAMUSCULAR | Status: DC
  Filled 2013-08-25 (×2): qty 1

## 2013-08-25 MED ORDER — ALBUTEROL SULFATE (2.5 MG/3ML) 0.083% IN NEBU
2.5000 mg | INHALATION_SOLUTION | RESPIRATORY_TRACT | Status: DC | PRN
  Administered 2013-08-25: 2.5 mg via RESPIRATORY_TRACT
  Filled 2013-08-25 (×2): qty 3

## 2013-08-25 MED ORDER — FENTANYL 25 MCG/HR TD PT72
25.0000 ug | MEDICATED_PATCH | TRANSDERMAL | Status: DC
  Administered 2013-08-25: 25 ug via TRANSDERMAL
  Filled 2013-08-25: qty 1

## 2013-08-25 MED ORDER — POLYETHYLENE GLYCOL 3350 17 G PO PACK
17.0000 g | PACK | Freq: Every day | ORAL | Status: DC | PRN
  Filled 2013-08-25: qty 1

## 2013-08-25 NOTE — Progress Notes (Signed)
Patient called out with complaints of SOB. BBS mostly clear, diminished with faint crackles in the bases. RR mid 20's. Patient states "I just can't catch my breath". Mild distress noted and patient very dyspneic. Respirations rapid and shallow. There does not appear to be an anxiety component related to his rapid shallow breathing. Ventolin 2.5mg  administered per RRT protocol and RT assessment/treatment protocol completed. Orders entered accordingly. RN aware, has placed call to MD. Patient is on 4 liters supplemental O2 via nasal canula. Post treatment respirations 20-22 per minute. He states he feels a "little bit" better but continues to state he is somewhat SOB. Awaiting further orders from MD.

## 2013-08-25 NOTE — ED Notes (Addendum)
Patient being followed by oncology, He has a home o2 meter that he purchased on his own and reads it frequently. He states that he came to the ER because of "low oxygen". States that his o2 goes up and down. Last chemo treatment was Friday.

## 2013-08-25 NOTE — Progress Notes (Signed)
DIAGNOSES: 1. Recurrent IgG kappa myeloma. 2. History of deep vein thrombosis/pulmonary embolism. 3. History of diverticulitis status post laparotomy with colostomy.  CURRENT THERAPY: 1. The patient has completed palliative radiation therapy to the     lumbosacral spine. 2. The patient Kyprolis/Cytoxan/Decadron. 3. Aspirin 162 mg p.o. daily. 4. Zometa 4 mg IV monthly.  INTERIM HISTORY:  Robert Berger is now on chemotherapy.  He had a Port-A- Cath placed.  He did have a bone marrow biopsy done while hospitalized over Christmas. This unfortunately showed extensive myeloma.  The pathological report ([id number]) showed 50% plasma cells.  There is marked decrease in hematopoiesis.  Plasma cells were atypical.  They are prominent nuclearly.  There were irregular nuclear contours.  What is shocking is that cytogenetics showed about 13 chromosomal changes.  This certainly is highly troublesome for his myeloma according to reported to a plasma cell leukemia.  His myeloma studies have really not shown a lot of myeloma.  His 24-hour urine does show 312 mg/day of light chain excretion.  His serum studies show a kappa light chain of 21.1.  His monoclonal spike is 1.29 g/dL.  He is still having pain issues.  Hopefully, the radiation therapy will start to work.  Hopefully chemotherapy that we start on him will begin to work.  We will have him on Duragesic patch now.  We will start this today.  He also is on Dilaudid.  He has a hard time with steroids.  We have to be careful with steroids.  He is walking around, but this is little bit difficult for him because of pain issues.  Overall, his performance status is ECOG 1-2.  PHYSICAL EXAMINATION:  General:  This is a somewhat chronically ill- appearing white gentleman in mild distress secondary to pain.  Vital Signs:  Temperature of 97.9, pulse 107, respiratory rate 20, blood pressure 131/71.  Head and Neck:  No ocular or oral lesions.  He  has no thrush.  There is no adenopathy in the neck.  He has no scleral icterus. Lungs:  With some slight decrease at the bases.  Cardiac:  Regular rate and rhythm with no murmurs, rubs, or bruits.  Abdomen:  Soft.  He has a colostomy that is intact.  There is no fluid wave.  There is no palpable hepatosplenomegaly.  Extremities:  Some chronic trace edema in his lower legs.  He has some stasis dermatitis changes in his lower legs.  His strength is 4+/5 bilaterally.  He has decent range motion of his joints. Skin:  Some scattered ecchymoses.  No petechia noted.  Neurologic:  No focal neurological deficits.  LABORATORY STUDIES:  White cell count 18.6, hemoglobin 9, hematocrit 27.3, platelet count 45,000.  Sodium 135, potassium 3.9, BUN 10, creatinine 0.9.  Calcium 8.8 with an albumin of 2.5.  Total protein 8.9.  IMPRESSION:  Robert Berger is a 73 year old gentleman.  He has reversion of his myeloma to a much more aggressive form.  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  We will start him on systemic chemotherapy now.  We had him on Revlimid. He actually done well on Revlimid with his myeloma studies improving. 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  It is clear that he has much more aggressive disease now.  I think Kyprolis/Cytoxan/Decadron would be the best option for him right now.  I am not sure if he would be too tolerant of VD-PACE.  However,  we certainly could explore that if necessary.  He has always been proactive.  He wants to be aggressive.  I certainly will move ahead quickly and continue to try to help get this myeloma back under control.  The cytogenetics are incredibly shocking.  I cannot remember the last time a myeloma patient had that number of chromosomal changes.  We will continue to be aggressive.  I will plan to see him back in I think a month formally.  He will be here weekly.  I spent  about 45 minutes with him today.    ______________________________ Volanda Napoleon, M.D. PRE/MEDQ  D:  08/24/2013  T:  08/25/2013  Job:  [id number]

## 2013-08-25 NOTE — ED Provider Notes (Signed)
CSN: [id number]     Arrival date & time 08/25/13  1147 History   First MD Initiated Contact with Patient 08/25/13 1206     Chief Complaint  Patient presents with  . Shortness of Breath    HPI  Patient presents with concern no dyspnea, fatigue ability.  Patient's symptoms began within the past day.  Since onset symptoms been progressive.  Patient notes that his pulse oximetry readings have been low, which is abnormal for him.  He does not use oxygen at home has no intrinsic pulmonary disease.  He does have multiple myeloma for which he recently started a new therapy. Patient had one episode of dyspnea one week ago, was evaluated at our facility, with negative CT angiography. He denies no syncope, vomiting, diarrhea. No relief with anything today, though once he arrived here his symptoms have improved somewhat with supplemental oxygen.   Past Medical History  Diagnosis Date  . Arthritis   . GERD (gastroesophageal reflux disease)   . Complication of anesthesia     aspiration during surgery, difficult to wake up  . Multiple myeloma   . Status post radiation therapy 03/23/12 - 04/05/12    T8 - L4/ 20 Gy/ 10 Fractions  . Dysphagia 05/06/12    ED Visit  . DVT (deep venous thrombosis)   . PE (pulmonary embolism)   . Cellulitis   . Abscess     Left arm and groin area   Past Surgical History  Procedure Laterality Date  . Hemorrhoid surgery    . Laparotomy  05/14/2012    Procedure: EXPLORATORY LAPAROTOMY;  Surgeon: Earnstine Regal, MD;  Location: [facility];  Service: General;  Laterality: N/A;  . Colostomy  05/14/2012    Procedure: COLOSTOMY;  Surgeon: Earnstine Regal, MD;  Location: [facility];  Service: General;  Laterality: N/A;  . Back surgery      47 years ago  . Fracture surgery      ankle   Family History  Problem Relation Age of Onset  . Cancer Maternal Grandfather   . Cancer Maternal Grandmother    History  Substance Use Topics  . Smoking status: Former Smoker    Types: Cigarettes     Quit date: 03/09/1978  . Smokeless tobacco: Never Used  . Alcohol Use: No     Comment: 1 glass of wine or a burbon daily., none in the last 3 months    Review of Systems  Constitutional:       Per HPI, otherwise negative  HENT:       Per HPI, otherwise negative  Respiratory:       Per HPI, otherwise negative  Cardiovascular:       Per HPI, otherwise negative  Gastrointestinal: Negative for vomiting.  Endocrine:       Negative aside from HPI  Genitourinary:       Neg aside from HPI   Musculoskeletal:       Per HPI, otherwise negative  Skin: Negative.   Neurological: Negative for syncope.    Allergies  Penicillins  Home Medications   Current Outpatient Rx  Name  Route  Sig  Dispense  Refill  . acyclovir (ZOVIRAX) 400 MG tablet   Oral   Take 1 tablet (400 mg total) by mouth daily.   30 tablet   3   . aspirin 81 MG tablet   Oral   Take 162 mg by mouth every morning.          Marland Kitchen  baclofen (LIORESAL) 10 MG tablet   Oral   Take 10 mg by mouth 3 (three) times daily.         Marland Kitchen diltiazem (CARDIZEM CD) 120 MG 24 hr capsule   Oral   Take 120 mg by mouth every morning.         . fentaNYL (DURAGESIC) 25 MCG/HR patch   Transdermal   Place 1 patch (25 mcg total) onto the skin every 3 (three) days.   10 patch   0   . HYDROmorphone (DILAUDID) 2 MG tablet   Oral   Take 1 tablet (2 mg total) by mouth every 4 (four) hours as needed for severe pain.   120 tablet   0   . lansoprazole (PREVACID) 30 MG capsule   Oral   Take 30 mg by mouth daily at 12 noon.         . lidocaine-prilocaine (EMLA) cream   Topical   Apply 1 application topically as needed.   30 g   0   . lisinopril (PRINIVIL,ZESTRIL) 2.5 MG tablet   Oral   Take 2.5 mg by mouth daily with breakfast.          . LORazepam (ATIVAN) 0.5 MG tablet   Oral   Take 1 tablet (0.5 mg total) by mouth every 6 (six) hours as needed (Nausea or vomiting).   30 tablet   0   . meloxicam (MOBIC) 15 MG  tablet   Oral   Take 15 mg by mouth every morning.          . ondansetron (ZOFRAN) 8 MG tablet   Oral   Take 1 tablet (8 mg total) by mouth 2 (two) times daily. Take two times a day starting the day after chemo for 2 days. Then take two times a day as needed for nausea or vomiting.   30 tablet   1   . ondansetron (ZOFRAN-ODT) 4 MG disintegrating tablet   Oral   Take 4 mg by mouth every 12 (twelve) hours as needed for nausea.          Marland Kitchen oxyCODONE-acetaminophen (PERCOCET/ROXICET) 5-325 MG per tablet   Oral   Take 1-2 tablets by mouth every 4 (four) hours as needed for moderate pain or severe pain.         . predniSONE (DELTASONE) 20 MG tablet   Oral   Take 20 mg by mouth daily with breakfast. Take 1 pill ady with food.         . prochlorperazine (COMPAZINE) 10 MG tablet   Oral   Take 1 tablet (10 mg total) by mouth every 6 (six) hours as needed (Nausea or vomiting).   30 tablet   1   . Pyridoxine HCl (VITAMIN B-6) 250 MG tablet   Oral   Take 250 mg by mouth every morning.           BP 123/76  Pulse 110  Temp(Src) 97.9 F (36.6 C) (Oral)  Resp 26  Ht $R'5\' 7"'YE$  (1.702 m)  Wt 197 lb (89.359 kg)  BMI 30.85 kg/m2  SpO2 97% Physical Exam  Nursing note and vitals reviewed. Constitutional: He is oriented to person, place, and time. He appears well-developed. He has a sickly appearance.  HENT:  Head: Normocephalic and atraumatic.  Eyes: Conjunctivae and EOM are normal.  Cardiovascular: Normal rate and regular rhythm.   Pulmonary/Chest: Effort normal. No stridor. No respiratory distress.  Abdominal: He exhibits no distension.  Musculoskeletal: He exhibits no  edema.  Neurological: He is alert and oriented to person, place, and time.  Skin: Skin is warm and dry.  Psychiatric: He has a normal mood and affect.    ED Course  Procedures (including critical care time) Labs Review Labs Reviewed  CBC WITH DIFFERENTIAL  COMPREHENSIVE METABOLIC PANEL   Imaging  Review Dg Chest 2 View  08/25/2013   CLINICAL DATA:  Hypoxia, dyspnea, and chest discomfort with history of multiple myeloma and pulmonary embolism. Marland Kitchen  EXAM: CHEST  2 VIEW  COMPARISON:  CT scan of the chest dated August 20, 2013.  FINDINGS: Since the previous study there have developed fluffy alveolar infiltrates bilaterally. The cardiopericardial silhouette does not appear enlarged. There is no pleural effusion. The mediastinum is normal in width. The Port-A-Cath appliance is unchanged in appearance. The patient has undergone kyphoplasty at the T10 through L1 levels.  IMPRESSION: 1. New fluffy alveolar densities in both lungs are worrisome for pneumonia. CHF is not problematic Lee excluded. 2. There is no evidence of a pleural effusion. 3. The patient has known paravertebral soft tissue masses consistent with myeloma. These are not clearly evident on today's study.   Electronically Signed   By: David  Martinique   On: 08/25/2013 13:26   I interpreted the x-ray, and agree with the radiologist interpretation.   O2- 90% ra, abnormal > 96%w Exline, abnormal  EKG Interpretation   None      after the initial evaluation I reviewed the patient's chart, discussed with him and his wife.  According to the patient's oncologist the patient's cancer has progressed, in a worrisome fashion.   3:39 PM With patient in pain.  Dilaudid provided.  Oxygen remains low.  Remainder of labs notable for thrombocytopenia and anemia. MDM  No diagnosis found. Patient presents with dyspnea, fatigue.  Notably, the patient has multiple myeloma, with progressive changes in the past weeks.  The patient was hospitalized one month ago for cellulitis.  Patient evaluation here suggested pneumonia, though with his comorbidities, other considerations remain. He had CTA within the past week, which was negative.  No LE edema. Patient required admission / transfer for further E/M.     Carmin Muskrat, MD 08/25/13 [phone]

## 2013-08-25 NOTE — Progress Notes (Signed)
ANTIBIOTIC CONSULT NOTE - INITIAL  Pharmacy Consult for vancomycin, aztreonam Indication: rule out pneumonia  Allergies  Allergen Reactions  . Penicillins Nausea And Vomiting, Swelling and Rash    Patient Measurements: Height: 5\' 7"  (170.2 cm) Weight: 197 lb (89.359 kg) IBW/kg (Calculated) : 66.1   Vital Signs: Temp: 97.9 F (36.6 C) (01/17 1156) Temp src: Oral (01/17 1156) BP: 123/76 mmHg (01/17 1156) Pulse Rate: 110 (01/17 1156) Intake/Output from previous day:   Intake/Output from this shift:    Labs:  Recent Labs  08/23/13 1017 08/23/13 1229  WBC 18.6*  --   HGB 9.0*  --   PLT 45*  --   CREATININE  --  0.9   Estimated Creatinine Clearance: 79.1 ml/min (by C-G formula based on Cr of 0.9). No results found for this basename: VANCOTROUGH, VANCOPEAK, VANCORANDOM, GENTTROUGH, GENTPEAK, GENTRANDOM, TOBRATROUGH, TOBRAPEAK, TOBRARND, AMIKACINPEAK, AMIKACINTROU, AMIKACIN,  in the last 72 hours   Microbiology: No results found for this or any previous visit (from the past 720 hour(s)).  Medical History: Past Medical History  Diagnosis Date  . Arthritis   . GERD (gastroesophageal reflux disease)   . Complication of anesthesia     aspiration during surgery, difficult to wake up  . Multiple myeloma   . Status post radiation therapy 03/23/12 - 04/05/12    T8 - L4/ 20 Gy/ 10 Fractions  . Dysphagia 05/06/12    ED Visit  . DVT (deep venous thrombosis)   . PE (pulmonary embolism)   . Cellulitis   . Abscess     Left arm and groin area    Assessment: 48 YOM with myeloma who presented to [facility] with SOB and drops in pulse ox. Starting antibiotics. SCr 0.9mg /dL with est CrCl ~16mL/min. Last WBC from 1/15- 18.6, currently afebrile. Noted aztreonam 2g IV x1 ordered  Goal of Therapy:  Vancomycin trough level 15-20 mcg/ml  Plan:  1. Vancomycin 1000mg  IV q12h 2. Cont aztreonam at 1g IV q8h  Florencia Zaccaro D. Evia Goldsmith, PharmD, BCPS Clinical Pharmacist Pager: [phone] 08/25/2013  3:14 PM

## 2013-08-25 NOTE — H&P (Addendum)
Patient Demographics  Robert Berger, is a 73 y.o. male  MRN: [id number]   DOB - [birth year]  Admit Date - 08/25/2013  Outpatient Primary MD for the patient is Nance Pear., NP   With History of -  Past Medical History  Diagnosis Date  . Arthritis   . GERD (gastroesophageal reflux disease)   . Complication of anesthesia     aspiration during surgery, difficult to wake up  . Multiple myeloma   . Status post radiation therapy 03/23/12 - 04/05/12    T8 - L4/ 20 Gy/ 10 Fractions  . Dysphagia 05/06/12    ED Visit  . DVT (deep venous thrombosis)   . PE (pulmonary embolism)   . Cellulitis   . Abscess     Left arm and groin area      Past Surgical History  Procedure Laterality Date  . Hemorrhoid surgery    . Laparotomy  05/14/2012    Procedure: EXPLORATORY LAPAROTOMY;  Surgeon: Earnstine Regal, MD;  Location: [facility];  Service: General;  Laterality: N/A;  . Colostomy  05/14/2012    Procedure: COLOSTOMY;  Surgeon: Earnstine Regal, MD;  Location: [facility];  Service: General;  Laterality: N/A;  . Back surgery      47 years ago  . Fracture surgery      ankle    in for   Chief Complaint  Patient presents with  . Shortness of Breath     HPI  Robert Berger  is a 73 y.o. male, history of multiple myeloma undergoing active chemotherapy and radiation under the care of Dr. 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 He was then transferred here for further care.  He denies any previous heart history, no chest pain or palpitations, denies any recent travel or exposure to sick contacts, he denies any fever chills, no cough, no abdominal pain or diarrhea. No focal weakness    Review of Systems    In addition to the HPI above,   No Fever-chills, No Headache, No changes with Vision or hearing, No problems swallowing food or Liquids, No Chest pain, Cough but positive orthopnea and Shortness of Breath, No Abdominal pain, No Nausea or Vommitting, Bowel movements are regular, No Blood in stool or Urine, No dysuria, No new skin rashes or bruises, No new joints pains-aches,  No new weakness, tingling, numbness in any extremity, No recent weight gain or loss, No polyuria, polydypsia  or polyphagia, No significant Mental Stressors.  A full 10 point Review of Systems was done, except as stated above, all other Review of Systems were negative.   Social History History  Substance Use Topics  . Smoking status: Former Smoker    Types: Cigarettes    Quit date: 03/09/1978  . Smokeless tobacco: Never Used  . Alcohol Use: No     Comment: 1 glass of wine or a burbon daily., none in the last 3 months      Family History Family History  Problem Relation Age of Onset  . Cancer Maternal Grandfather   . Cancer Maternal Grandmother       Prior to Admission medications   Medication Sig Start Date End Date Taking? Authorizing Provider  acyclovir (ZOVIRAX) 400 MG tablet Take 1 tablet (400 mg total) by mouth daily. 08/23/13  Yes Volanda Napoleon, MD  aspirin 81 MG tablet Take 162 mg by mouth every morning.    Yes Historical Provider, MD  baclofen (LIORESAL) 10 MG tablet Take 10 mg by mouth 3 (three) times daily. 02/26/13  Yes Debbrah Alar, NP  diltiazem (CARDIZEM CD) 120 MG 24 hr capsule Take  120 mg by mouth every morning. 02/26/13  Yes Debbrah Alar, NP  HYDROmorphone (DILAUDID) 2 MG tablet Take 1 tablet (2 mg total) by mouth every 4 (four) hours as needed for severe pain. 08/24/13  Yes Volanda Napoleon, MD  lansoprazole (PREVACID) 30 MG capsule Take 30 mg by mouth daily at 12 noon.   Yes Historical Provider, MD  lidocaine-prilocaine (EMLA) cream Apply 1 application topically as needed (before chemo).   Yes Historical Provider, MD  lisinopril (PRINIVIL,ZESTRIL) 2.5 MG tablet Take 2.5 mg by mouth daily with breakfast.  01/30/13  Yes Historical Provider, MD  meloxicam (MOBIC) 15 MG tablet Take 15 mg by mouth every morning.  07/28/13  Yes Volanda Napoleon, MD  ondansetron (ZOFRAN) 8 MG tablet Take 1 tablet (8 mg total) by mouth 2 (two) times daily. Take two times a day starting the day after chemo for 2 days. Then take two times a day as needed for nausea or vomiting. 08/23/13  Yes Volanda Napoleon, MD  predniSONE (DELTASONE) 20 MG tablet Take 10 mg by mouth daily with breakfast. Take 1 pill ady with food. 07/25/13  Yes Volanda Napoleon, MD  Pyridoxine HCl (VITAMIN B-6) 250 MG tablet Take 250 mg by mouth every morning.  06/21/13  Yes Volanda Napoleon, MD  fentaNYL (DURAGESIC) 25 MCG/HR patch Place 1 patch (25 mcg total) onto the skin every 3 (three) days. 08/23/13   Volanda Napoleon, MD  LORazepam (ATIVAN) 0.5 MG tablet Take 1 tablet (0.5 mg total) by mouth every 6 (six) hours as needed (Nausea or vomiting). 08/23/13   Volanda Napoleon, MD  prochlorperazine (COMPAZINE) 10 MG tablet Take 1 tablet (10 mg total) by mouth every 6 (six) hours as needed (Nausea or vomiting). 08/23/13   Volanda Napoleon, MD    Allergies  Allergen Reactions  . Penicillins Nausea And Vomiting, Swelling and Rash    Physical Exam  Vitals  Blood pressure 133/54, pulse 91, temperature 97.8 F (36.6 C), temperature source Oral, resp. rate 20, height _0  (1.702 m), weight 90 kg (198 lb 6.6 oz), SpO2 92.00%.   1.  General elderly white male lying in bed in mild shortness of breath wearing oxygen  2. Normal affect and insight, Not Suicidal or Homicidal, Awake Alert, Oriented X 3.  3. No F.N deficits, ALL C.Nerves Intact, Strength 5/5 all 4 extremities, Sensation intact all 4 extremities, Plantars down going.  4. Ears and Eyes appear Normal, Conjunctivae clear, PERRLA. Moist Oral Mucosa.  5. Supple Neck,+ JVD, No cervical lymphadenopathy appriciated, No Carotid Bruits.  6. Symmetrical Chest wall movement, Good air movement bilaterally, bilat coarse B sounds  7. RRR, No Gallops, Rubs or Murmurs, No Parasternal Heave.  8. Positive Bowel Sounds, Abdomen Soft, Non tender, No organomegaly appriciated,No rebound -guarding or rigidity.  9.  No Cyanosis, Normal Skin Turgor, No Skin Rash or Bruise. Trace bilateral edema in both legs  10. Good muscle tone,  joints appear normal , no effusions, Normal ROM.  11. No Palpable Lymph Nodes in Neck or Axillae     Data Review  CBC  Recent Labs Lab 08/20/13 0150 08/21/13 1254 08/23/13 1017 08/25/13 1450  WBC 12.1* 14.9* 18.6* 6.2  HGB 9.2* 9.1* 9.0* 7.7*  HCT 27.5* 26.6* 27.3* 23.2*  PLT 53* 51* 45* 19*  MCV 95.8 94.7 97 95.5  MCH 32.1 32.4 31.9 31.7  MCHC 33.5 34.2 33.0 33.2  RDW 18.2* 18.8* 19.7* 20.6*  LYMPHSABS 5.6*  --   --  1.3  MONOABS 0.8  --   --  1.1*  EOSABS 0.1  --   --  0.0  BASOSABS 0.0  --   --  0.0   ------------------------------------------------------------------------------------------------------------------  Chemistries   Recent Labs Lab 08/20/13 0150 08/23/13 1229 08/25/13 1450  NA 133* 135* 138  K 4.4 3.9 4.8  CL 98  --  105  CO2 _0 GLUCOSE 182* 153* 109*  BUN 13 9.8 30*  CREATININE 0.73 0.9 0.90  CALCIUM 8.6 8.8 8.0*  AST  --  51* 33  ALT  --  34 23  ALKPHOS  --  176* 123*  BILITOT  --  0.63 0.3    ------------------------------------------------------------------------------------------------------------------ estimated creatinine clearance is 79.4 ml/min (by C-G formula based on Cr of 0.9). ------------------------------------------------------------------------------------------------------------------ No results found for this basename: TSH, T4TOTAL, FREET3, T3FREE, THYROIDAB,  in the last 72 hours   Coagulation profile  Recent Labs Lab 08/21/13 1254  INR 1.01   ------------------------------------------------------------------------------------------------------------------- No results found for this basename: DDIMER,  in the last 72 hours -------------------------------------------------------------------------------------------------------------------  Cardiac Enzymes No results found for this basename: CK, CKMB, TROPONINI, MYOGLOBIN,  in the last 168 hours ------------------------------------------------------------------------------------------------------------------ No components found with this basename: POCBNP,    ---------------------------------------------------------------------------------------------------------------  Urinalysis    Component Value Date/Time   COLORURINE AMBER* 08/20/2013 0204   APPEARANCEUR CLOUDY* 08/20/2013 0204   LABSPEC 1.026 08/20/2013 0204   PHURINE 5.5 08/20/2013 0204   GLUCOSEU 100* 08/20/2013 0204   HGBUR NEGATIVE 08/20/2013 0204   BILIRUBINUR NEGATIVE 08/20/2013 0204   KETONESUR NEGATIVE 08/20/2013 0204   PROTEINUR 100* 08/20/2013 0204   UROBILINOGEN 1.0 08/20/2013 0204   NITRITE NEGATIVE 08/20/2013 0204   LEUKOCYTESUR NEGATIVE 08/20/2013 0204    ----------------------------------------------------------------------------------------------------------------  Imaging results:   Dg Chest 2 View  08/25/2013   CLINICAL DATA:  Hypoxia, dyspnea, and chest discomfort with history of multiple myeloma and pulmonary embolism. Marland Kitchen  EXAM:  CHEST  2 VIEW  COMPARISON:  CT scan of the chest dated August 20, 2013.  FINDINGS: Since the previous study there have developed fluffy alveolar infiltrates bilaterally. The cardiopericardial silhouette does not appear enlarged. There is no pleural effusion. The mediastinum is normal in width. The Port-A-Cath appliance is unchanged in appearance. The patient has undergone kyphoplasty at the T10 through  L1 levels.  IMPRESSION: 1. New fluffy alveolar densities in both lungs are worrisome for pneumonia. CHF is not problematic Lee excluded. 2. There is no evidence of a pleural effusion. 3. The patient has known paravertebral soft tissue masses consistent with myeloma. These are not clearly evident on today's study.   Electronically Signed   By: David  Martinique   On: 08/25/2013 13:26   Ct Angio Chest Pe W/cm &/or Wo Cm  08/20/2013   CLINICAL DATA:  Shortness of breath and fatigue. History of DVT. Multiple myeloma.  EXAM: CT ANGIOGRAPHY CHEST WITH CONTRAST  TECHNIQUE: Multidetector CT imaging of the chest was performed using the standard protocol during bolus administration of intravenous contrast. Multiplanar CT image reconstructions including MIPs were obtained to evaluate the vascular anatomy.  CONTRAST:  157m OMNIPAQUE IOHEXOL 350 MG/ML SOLN  COMPARISON:  05/13/2012  FINDINGS: THORACIC INLET/BODY WALL:  Enlargement of the left lobe thyroid gland with coarse calcifications, stable from 2013.  MEDIASTINUM:  Normal heart size. No pericardial effusion. Multi focal coronary artery atherosclerosis. No acute vascular abnormality, including pulmonary embolism or aortic dissection. No adenopathy.  LUNG WINDOWS:  No consolidation.  No effusion.  No suspicious pulmonary nodule.  UPPER ABDOMEN:  No acute findings.  OSSEOUS:  There are scattered lytic lesions that the imaged skeleton, consistent with myeloma. No acute fracture appreciated. The most notable findings in the thoracic spine is extensive osteolysis of the T9, T10,  and T11 bodies, encompassing previously placed bone cement. There is increasing paravertebral soft tissue mass, bilaterally T9-10 and on the left at T8. The nodule at the level of T8 measures 18 mm. Although anemic, the hemo below levels are not as low as would be expected for extramedullary hematopoiesis. Additionally, the paraspinal abnormalities are limited to levels with definite myeloma this involvement of the bone. Question new growth into the posterior canal at the level of T10. No acute fracture appreciated. Other notable lytic areas within the manubrium and T3 body.  Review of the MIP images confirms the above findings.  IMPRESSION: 1. Negative for pulmonary embolism or other acute intrathoracic abnormality. 2. Multiple myeloma. Compared to thoracic spine MRI 07/27/2013, new/larger paravertebral extension at the level of T8 and T9/10. If there are new thoracic cord symptoms, MRI could re-evaluate the spinal canal at T10.   Electronically Signed   By: JJorje GuildM.D.   On: 08/20/2013 03:41           Order EKG   Assessment & Plan     1. Acute hypoxic respiratory failure appears more consistent with CHF however HCAP cannot be ruled out. Will be admitted on a telemetry bed, we'll pan culture him including sputum and blood cultures, place on broad-spectrum antibiotics which will include IV vancomycin, meropenem and azithromycin.    However clinical examination is suggestive of CHF also, we'll check pro BNP, baseline EKG, cycle troponin, check echo. Given a trial of IV Lasix. Fluid and salt restriction. Nitro paste and monitor.    Continue supportive care with oxygen nebulizer treatments as needed.      2. Multiple myeloma. Undergoing chemotherapy and radiation by Dr. EMarin Olp He will be notified via epic of patient's admission.     3. GERD. Continue PPI     4. Hypertension continue Cardizem and monitor clinically.      5. Anemia & Thrombocytopenia - likely  Chemo/MM related - check Anemia Panel, Type- screen & repeat H&H in am, No bleeding.      DVT Prophylaxis  SCD   AM Labs Ordered, also please review Full Orders  Family Communication: Admission, patients condition and plan of care including tests being ordered have been discussed with the patient and indicates understanding and agree with the plan and Code Status.  Code Status Full  Likely DC to Home  Condition GUARDED   Time spent in minutes : 35    Margretta Zamorano K M.D on 08/25/2013 at 8:48 PM  Between 7am to 7pm - Pager - [phone]  After 7pm go to [email or web address] - password [email or web address]  And look for the night coverage person covering me after hours  [facility] Office  [phone]

## 2013-08-26 ENCOUNTER — Inpatient Hospital Stay (HOSPITAL_COMMUNITY): Payer: Medicare Other

## 2013-08-26 ENCOUNTER — Other Ambulatory Visit: Payer: Self-pay

## 2013-08-26 DIAGNOSIS — R5381 Other malaise: Secondary | ICD-10-CM

## 2013-08-26 DIAGNOSIS — I517 Cardiomegaly: Secondary | ICD-10-CM

## 2013-08-26 LAB — BLOOD GAS, ARTERIAL
ACID-BASE EXCESS: 1 mmol/L (ref 0.0–2.0)
Bicarbonate: 24.1 mEq/L — ABNORMAL HIGH (ref 20.0–24.0)
DRAWN BY: 307971
O2 Content: 6 L/min
O2 Saturation: 87.2 %
PATIENT TEMPERATURE: 98.6
TCO2: 21.8 mmol/L (ref 0–100)
pCO2 arterial: 34.8 mmHg — ABNORMAL LOW (ref 35.0–45.0)
pH, Arterial: 7.455 — ABNORMAL HIGH (ref 7.350–7.450)
pO2, Arterial: 56.2 mmHg — ABNORMAL LOW (ref 80.0–100.0)

## 2013-08-26 LAB — CBC
HCT: 22.9 % — ABNORMAL LOW (ref 39.0–52.0)
HEMATOCRIT: 23.4 % — AB (ref 39.0–52.0)
HEMOGLOBIN: 7.7 g/dL — AB (ref 13.0–17.0)
Hemoglobin: 7.5 g/dL — ABNORMAL LOW (ref 13.0–17.0)
MCH: 31.2 pg (ref 26.0–34.0)
MCH: 31.1 pg (ref 26.0–34.0)
MCHC: 32.9 g/dL (ref 30.0–36.0)
MCHC: 32.8 g/dL (ref 30.0–36.0)
MCV: 94.7 fL (ref 78.0–100.0)
MCV: 95 fL (ref 78.0–100.0)
PLATELETS: 17 10*3/uL — AB (ref 150–400)
PLATELETS: 15 10*3/uL — AB (ref 150–400)
RBC: 2.47 MIL/uL — ABNORMAL LOW (ref 4.22–5.81)
RBC: 2.41 MIL/uL — AB (ref 4.22–5.81)
RDW: 20.3 % — ABNORMAL HIGH (ref 11.5–15.5)
RDW: 20.1 % — ABNORMAL HIGH (ref 11.5–15.5)
WBC: 7 10*3/uL (ref 4.0–10.5)
WBC: 7.4 10*3/uL (ref 4.0–10.5)

## 2013-08-26 LAB — IRON AND TIBC
Iron: 73 ug/dL (ref 42–135)
SATURATION RATIOS: 43 % (ref 20–55)
TIBC: 170 ug/dL — ABNORMAL LOW (ref 215–435)
UIBC: 97 ug/dL — ABNORMAL LOW (ref 125–400)

## 2013-08-26 LAB — BASIC METABOLIC PANEL
BUN: 23 mg/dL (ref 6–23)
CHLORIDE: 102 meq/L (ref 96–112)
CO2: 23 mEq/L (ref 19–32)
Calcium: 7.7 mg/dL — ABNORMAL LOW (ref 8.4–10.5)
Creatinine, Ser: 0.82 mg/dL (ref 0.50–1.35)
GFR calc Af Amer: >90 mL/min (ref >90)
GFR calc non Af Amer: 86 mL/min — ABNORMAL LOW (ref >90)
GLUCOSE: 119 mg/dL — AB (ref 70–99)
POTASSIUM: 4.2 meq/L (ref 3.7–5.3)
SODIUM: 136 meq/L — AB (ref 137–147)

## 2013-08-26 LAB — ABO/RH: ABO/RH(D): A POS

## 2013-08-26 LAB — APTT: aPTT: 28 seconds (ref 24–37)

## 2013-08-26 LAB — INFLUENZA PANEL BY PCR (TYPE A & B)
H1N1FLUPCR: NOT DETECTED
INFLBPCR: NEGATIVE
Influenza A By PCR: NEGATIVE

## 2013-08-26 LAB — VITAMIN B12: VITAMIN B 12: 648 pg/mL (ref 211–911)

## 2013-08-26 LAB — TROPONIN I: Troponin I: <0.3 ng/mL (ref <0.30)

## 2013-08-26 LAB — PROTIME-INR
INR: 1.12 (ref 0.00–1.49)
Prothrombin Time: 14.2 seconds (ref 11.6–15.2)

## 2013-08-26 LAB — FERRITIN: FERRITIN: 1781 ng/mL — AB (ref 22–322)

## 2013-08-26 LAB — FOLATE: Folate: 12.5 ng/mL

## 2013-08-26 MED ORDER — OXYCODONE HCL ER 10 MG PO T12A
10.0000 mg | EXTENDED_RELEASE_TABLET | Freq: Two times a day (BID) | ORAL | Status: DC
  Administered 2013-08-26: 10 mg via ORAL
  Filled 2013-08-26: qty 1

## 2013-08-26 MED ORDER — IOHEXOL 350 MG/ML SOLN
100.0000 mL | Freq: Once | INTRAVENOUS | Status: AC | PRN
  Administered 2013-08-26: 100 mL via INTRAVENOUS

## 2013-08-26 MED ORDER — SODIUM CHLORIDE 0.9 % IJ SOLN
10.0000 mL | INTRAMUSCULAR | Status: DC | PRN
  Administered 2013-08-26: 20 mL
  Administered 2013-09-01 – 2013-09-03 (×4): 10 mL

## 2013-08-26 MED ORDER — ALBUTEROL SULFATE (2.5 MG/3ML) 0.083% IN NEBU: 2.5000 mg | INHALATION_SOLUTION | RESPIRATORY_TRACT | Status: DC | PRN

## 2013-08-26 MED ORDER — OXYCODONE HCL ER 10 MG PO T12A: 10.0000 mg | EXTENDED_RELEASE_TABLET | Freq: Two times a day (BID) | ORAL | Status: DC

## 2013-08-26 MED ORDER — SODIUM CHLORIDE 0.9 % IV SOLN
1250.0000 mg | Freq: Two times a day (BID) | INTRAVENOUS | Status: DC
  Filled 2013-08-26: qty 1250

## 2013-08-26 MED ORDER — SODIUM CHLORIDE 0.9 % IV SOLN
INTRAVENOUS | Status: DC
  Administered 2013-08-26: 1000 mL via INTRAVENOUS
  Administered 2013-08-28 – 2013-09-03 (×4): via INTRAVENOUS

## 2013-08-26 MED ORDER — VANCOMYCIN HCL 10 G IV SOLR
1500.0000 mg | Freq: Two times a day (BID) | INTRAVENOUS | Status: DC
  Administered 2013-08-26 – 2013-08-28 (×4): 1500 mg via INTRAVENOUS
  Filled 2013-08-26 (×5): qty 1500

## 2013-08-26 MED ORDER — HYDROMORPHONE HCL 2 MG PO TABS
2.0000 mg | ORAL_TABLET | ORAL | Status: DC | PRN
  Administered 2013-08-26 – 2013-08-27 (×3): 2 mg via ORAL
  Filled 2013-08-26 (×4): qty 1

## 2013-08-26 NOTE — Progress Notes (Signed)
Notified the on call Kathline Magic about pt condition including SOB,O2 sat of 87% on 5 L, PLT down form 20 to 17, Albuterol order modified to prn Q 2hrs, see MAR. Will continue monitor , oncoming nurse  made aware.

## 2013-08-26 NOTE — Progress Notes (Signed)
Pt to CT scan.

## 2013-08-26 NOTE — Progress Notes (Signed)
CRITICAL VALUE ALERT  Critical value received:  Platelets 17  Date of notification:  08/26/2013  Time of notification:  0427  Critical value read back:yes  Nurse who received alert:  Janett Labella  MD notified (1st page):  Kathline Magic  Time of first page:  0420  MD notified (2nd page):  Time of second page:  Responding MD:  Kathline Magic  Time MD responded:  [phone]

## 2013-08-26 NOTE — Progress Notes (Addendum)
Pharmacy Consult Note - Vancomycin, aztreonam follow up  Labs: Scr 0.82, CrCl 88  A/P: Vancomycin started at dose of 1g q12; however, based on current weight of 92kg and estimated CrCl of 88 ml/min along with previous vancomycin dosing that patient was getting back in December, will change dosing to 1500mg  IV q12 and check a trough at steady state if necessary. A dose of 1250mg  IV q12 back in December resulted in a trough of 9.1 and dose was increased to 1500mg  q12. Continue current aztreonam dosing of 1g IV q8   Adrian Saran, PharmD, BCPS Pager [phone] 08/26/2013 1:03 PM

## 2013-08-26 NOTE — Progress Notes (Addendum)
[facility] PROGRESS NOTE  Robert Berger LNL:[id number] DOB: 23-Mar-1941 DOA: 08/25/2013 PCP: Nance Pear., NP  Assessment/Plan: Acuter hypoxic respiratory  failure in the setting of underlying PNA. Flu PCR negative. No clear sign of CHF on exam.  Monitor 2D echo ( documented LV dysfn from echo in 2013) . Monitor  I/O. Monitor o2 sat closely. patient tachycardic at rest.  will repeat CT angio chest in normal 2D echo,  if sats not improved and patient remains tachycardic.  continue empiric abx.  Follow blood cx, urine legionella ag. strep ag  -monitor on tele continue scheduled nebs   Multiple myeloma  s/p chemo and radiation. Currently getting chemo. follows with Dr Marin Olp Continue prophylactic acyclovir and narcotics for back pain. Will notify Dr Marin Olp Has thrombocytopenia on presentation. plt of 17 today. Does not need transfusion yet. likely due to recent chemo. Will monitor.  Back pain  secondary to MM On dilaudid at home. Dose adjusted. contineu fentaly patch.   Code Status: full code Family Communication: mother at bedside Disposition Plan: liekly home once imrpvoed   Consultants:  none  Procedures:  none  Antibiotics:  IV vanco, aztreonam and azithromycin ( 1/17>.)  Diet: cardiac  DVT prophylaxis: SCD  HPI/Subjective: Patient noted to have o2 desat to 85s. Increase o2 to 5 L and sats at 91% this am. C/o severe back pain.  Objective: Filed Vitals:   08/26/13 0438  BP: 113/71  Pulse: 106  Temp: 97.8 F (36.6 C)  Resp: 22    Intake/Output Summary (Last 24 hours) at 08/26/13 1118 Last data filed at 08/26/13 0700  Gross per 24 hour  Intake    750 ml  Output   1400 ml  Net   -650 ml   Filed Weights   08/25/13 1156 08/25/13 1900 08/26/13 0725  Weight: 89.359 kg (197 lb) 90 kg (198 lb 6.6 oz) 92 kg (202 lb 13.2 oz)    Exam:   General:  Elderly male lying in bed in some distress due to pain  HEENT: no pallor, moist  mucosa   chest: diminished breath sounds at bases. No rhonchi or wheeze  Cardiovascular: S1&S2 tachycardic, No murmurs, rubs or gallop  abd: soft, NT, ND, colostomy bag in place, BS+  Ext: warm, no edema  CNS: AAOX3    Data Reviewed: Basic Metabolic Panel:  Recent Labs Lab 08/20/13 0150 08/23/13 1229 08/25/13 1450 08/25/13 2140 08/26/13 0258  NA 133* 135* 138  --  136*  K 4.4 3.9 4.8  --  4.2  CL 98  --  105  --  102  CO2 $Re'24 22 24  'uGO$ --  23  GLUCOSE 182* 153* 109*  --  119*  BUN 13 9.8 30*  --  23  CREATININE 0.73 0.9 0.90 0.83 0.82  CALCIUM 8.6 8.8 8.0*  --  7.7*   Liver Function Tests:  Recent Labs Lab 08/23/13 1229 08/25/13 1450  AST 51* 33  ALT 34 23  ALKPHOS 176* 123*  BILITOT 0.63 0.3  PROT 8.9* 7.9  ALBUMIN 2.5* 2.2*   No results found for this basename: LIPASE, AMYLASE,  in the last 168 hours No results found for this basename: AMMONIA,  in the last 168 hours CBC:  Recent Labs Lab 08/20/13 0150 08/21/13 1254 08/23/13 1017 08/25/13 1450 08/25/13 2140 08/26/13 0258  WBC 12.1* 14.9* 18.6* 6.2 5.8 7.0  NEUTROABS 5.6  --   --  3.8  --   --   HGB 9.2* 9.1* 9.0*  7.7* 7.5* 7.7*  HCT 27.5* 26.6* 27.3* 23.2* 22.8* 23.4*  MCV 95.8 94.7 97 95.5 95.4 94.7  PLT 53* 51* 45* 19* 20* 17*   Cardiac Enzymes:  Recent Labs Lab 08/25/13 2145 08/26/13 0233 08/26/13 0840  TROPONINI <0.30 <0.30 <0.30   BNP (last 3 results)  Recent Labs  08/25/13 2140  PROBNP 437.4*   CBG: No results found for this basename: GLUCAP,  in the last 168 hours  No results found for this or any previous visit (from the past 240 hour(s)).   Studies: Dg Chest 2 View  08/25/2013   CLINICAL DATA:  Hypoxia, dyspnea, and chest discomfort with history of multiple myeloma and pulmonary embolism. Marland Kitchen  EXAM: CHEST  2 VIEW  COMPARISON:  CT scan of the chest dated August 20, 2013.  FINDINGS: Since the previous study there have developed fluffy alveolar infiltrates bilaterally. The  cardiopericardial silhouette does not appear enlarged. There is no pleural effusion. The mediastinum is normal in width. The Port-A-Cath appliance is unchanged in appearance. The patient has undergone kyphoplasty at the T10 through L1 levels.  IMPRESSION: 1. New fluffy alveolar densities in both lungs are worrisome for pneumonia. CHF is not problematic Lee excluded. 2. There is no evidence of a pleural effusion. 3. The patient has known paravertebral soft tissue masses consistent with myeloma. These are not clearly evident on today's study.   Electronically Signed   By: David  Martinique   On: 08/25/2013 13:26    Scheduled Meds: . acyclovir  400 mg Oral Daily  . aspirin EC  162 mg Oral q morning - 10a  . azithromycin  500 mg Intravenous Q24H  . aztreonam  1 g Intravenous Q8H  . diltiazem  120 mg Oral q morning - 10a  . fentaNYL  25 mcg Transdermal Q72H  . ipratropium-albuterol  3 mL Nebulization Q6H  . nitroGLYCERIN  0.5 inch Topical Q6H  . OxyCODONE  10 mg Oral Q12H  . pantoprazole  20 mg Oral Daily  . predniSONE  10 mg Oral Q breakfast  . sodium chloride  3 mL Intravenous Q12H  . vancomycin  1,000 mg Intravenous Q12H   Continuous Infusions:     Time spent: *35 minutes    Shireen Rayburn, [place]  [facility] Pager [phone]. If 7PM-7AM, please contact night-coverage at [email or web address], password [email or web address] 08/26/2013, 11:18 AM  LOS: 1 day

## 2013-08-26 NOTE — Progress Notes (Signed)
CRITICAL VALUE ALERT  Critical value received: PLT 20  Date of notification:  08/25/2013  Time of notification:  2230  Critical value read back:yes  Nurse who received alert:  Carver Fila RN  MD notified (1st page): Kathline Magic, PA  Time of first page:  2259  MD notified (2nd page):  Time of second page:  Responding MD:Tom Rogue Bussing   Time MD responded: 2302

## 2013-08-26 NOTE — Progress Notes (Signed)
Unable to print out EKG strip.

## 2013-08-26 NOTE — Progress Notes (Signed)
Gave pt PRN tx. Could not scan medication due to NP still having workstation open and my mar was locked. Looked at arm bracelet along with worksheet.

## 2013-08-26 NOTE — Progress Notes (Signed)
  Echocardiogram 2D Echocardiogram has been performed.  Janalee Dane M 08/26/2013, 12:08 PM

## 2013-08-26 NOTE — Progress Notes (Signed)
[facility] progress note. Chief complaint. Dyspnea, hypoxia. History of present illness. This 73 year old male in hospital with acute hypoxic respiratory failure in the setting of underlying pneumonia. Patient experienced some desats and complaints of dyspnea over the course of the day. CT angiogram of the chest was obtained and this was negative for PE. CT scan did note bilateral pulmonary infiltrates suggestive of pneumonia/pneumonitis. He came to evaluate the patient at bedside and found him to be dyspneic and tachypnea. Patient has difficulty completing sentences due to dyspnea. O2 sats are in the mid 80s despite Venturi mask. Patient does admit to feeling more short of breath today. He does deny chest pain. An arterial blood gas was obtained earlier showing pH 7.455, PCO2 34.8, PO2 56.2, bicarbonate 24.1. Vital signs. Temperature 98.7, pulse 110, respirations 25-30, blood pressure 136/64. O2 sats 86% on Venturi mask. General appearance. Frail elderly male who is alert, cooperative, and in mild respiratory distress. Cardiac. Regular rhythm, tachycardic rate about 105 apically. No jugular venous distention or edema. Lungs. Breath sounds are clear but reduced midlung to base bilaterally. Dyspnea and tachypnea Avonex. Desats into the mid 80s. Abdomen. Soft with positive bowel sounds. No pain. Problem #1. Acute hypoxic respiratory failure. The patient appears to be slowly decompensating on first nasal cannula and now Venturi mask oxygen. I will move the patient to a step down unit for closer monitoring. We'll initiate the patient on BiPAP. Will recheck an arterial blood gas in about 4 hours post initiation of BiPAP and follow for that result.

## 2013-08-27 ENCOUNTER — Inpatient Hospital Stay (HOSPITAL_COMMUNITY): Payer: Medicare Other

## 2013-08-27 DIAGNOSIS — L0390 Cellulitis, unspecified: Secondary | ICD-10-CM

## 2013-08-27 DIAGNOSIS — L0291 Cutaneous abscess, unspecified: Secondary | ICD-10-CM

## 2013-08-27 LAB — BLOOD GAS, ARTERIAL
Acid-Base Excess: 2 mmol/L (ref 0.0–2.0)
Acid-Base Excess: 1.5 mmol/L (ref 0.0–2.0)
Acid-Base Excess: 0.8 mmol/L (ref 0.0–2.0)
Bicarbonate: 25.9 mEq/L — ABNORMAL HIGH (ref 20.0–24.0)
Bicarbonate: 25.8 mEq/L — ABNORMAL HIGH (ref 20.0–24.0)
Bicarbonate: 25.6 mEq/L — ABNORMAL HIGH (ref 20.0–24.0)
Drawn by: 31814
Drawn by: 295031
Drawn by: 295031
FIO2: 1 %
FIO2: 1 %
FIO2: 1 %
LHR: 14 {breaths}/min
MECHVT: 530 mL
O2 SAT: 98.5 %
O2 Saturation: 97.4 %
O2 Saturation: 94.9 %
PEEP: 5 cmH2O
PH ART: 7.431 (ref 7.350–7.450)
Patient temperature: 98.6
Patient temperature: 98.6
Patient temperature: 98.6
TCO2: 24.5 mmol/L (ref 0–100)
TCO2: 24.7 mmol/L (ref 0–100)
TCO2: 24.1 mmol/L (ref 0–100)
pCO2 arterial: 39.5 mmHg (ref 35.0–45.0)
pCO2 arterial: 42.6 mmHg (ref 35.0–45.0)
pCO2 arterial: 44.8 mmHg (ref 35.0–45.0)
pH, Arterial: 7.4 (ref 7.350–7.450)
pH, Arterial: 7.375 (ref 7.350–7.450)
pO2, Arterial: 245 mmHg — ABNORMAL HIGH (ref 80.0–100.0)
pO2, Arterial: 81.2 mmHg (ref 80.0–100.0)
pO2, Arterial: 153 mmHg — ABNORMAL HIGH (ref 80.0–100.0)

## 2013-08-27 LAB — CBC
HCT: 21.7 % — ABNORMAL LOW (ref 39.0–52.0)
Hemoglobin: 7.2 g/dL — ABNORMAL LOW (ref 13.0–17.0)
MCH: 31.6 pg (ref 26.0–34.0)
MCHC: 33.2 g/dL (ref 30.0–36.0)
MCV: 95.2 fL (ref 78.0–100.0)
PLATELETS: 13 10*3/uL — AB (ref 150–400)
RBC: 2.28 MIL/uL — AB (ref 4.22–5.81)
RDW: 20 % — ABNORMAL HIGH (ref 11.5–15.5)
WBC: 7.3 10*3/uL (ref 4.0–10.5)

## 2013-08-27 LAB — LEGIONELLA ANTIGEN, URINE: Legionella Antigen, Urine: NEGATIVE

## 2013-08-27 LAB — STREP PNEUMONIAE URINARY ANTIGEN: STREP PNEUMO URINARY ANTIGEN: NEGATIVE

## 2013-08-27 LAB — PREPARE RBC (CROSSMATCH)

## 2013-08-27 LAB — GLUCOSE, CAPILLARY: Glucose-Capillary: 100 mg/dL — ABNORMAL HIGH (ref 70–99)

## 2013-08-27 MED ORDER — PREDNISONE 10 MG PO TABS
10.0000 mg | ORAL_TABLET | Freq: Every day | ORAL | Status: DC
  Administered 2013-08-28 – 2013-09-02 (×6): 10 mg
  Filled 2013-08-27 (×7): qty 1

## 2013-08-27 MED ORDER — LIDOCAINE HCL (CARDIAC) 20 MG/ML IV SOLN
INTRAVENOUS | Status: AC
  Filled 2013-08-27: qty 5

## 2013-08-27 MED ORDER — ROCURONIUM BROMIDE 50 MG/5ML IV SOLN
INTRAVENOUS | Status: AC
  Filled 2013-08-27: qty 2

## 2013-08-27 MED ORDER — FENTANYL CITRATE 0.05 MG/ML IJ SOLN: 50.0000 ug | Freq: Once | INTRAMUSCULAR | Status: DC

## 2013-08-27 MED ORDER — BIOTENE DRY MOUTH MT LIQD: 15.0000 mL | Freq: Four times a day (QID) | OROMUCOSAL | Status: DC

## 2013-08-27 MED ORDER — LORAZEPAM 2 MG/ML IJ SOLN
2.0000 mg | Freq: Once | INTRAMUSCULAR | Status: AC
  Administered 2013-08-27: 2 mg via INTRAVENOUS
  Filled 2013-08-27: qty 1

## 2013-08-27 MED ORDER — FENTANYL BOLUS VIA INFUSION
25.0000 ug | INTRAVENOUS | Status: DC | PRN
  Administered 2013-08-27 (×3): 50 ug via INTRAVENOUS
  Filled 2013-08-27: qty 50

## 2013-08-27 MED ORDER — MIDAZOLAM HCL 2 MG/2ML IJ SOLN
INTRAMUSCULAR | Status: AC
Start: 2013-08-27 — End: 2013-08-27
  Filled 2013-08-27: qty 2

## 2013-08-27 MED ORDER — ETOMIDATE 2 MG/ML IV SOLN
20.0000 mg | Freq: Once | INTRAVENOUS | Status: AC
  Administered 2013-08-27: 20 mg via INTRAVENOUS
  Filled 2013-08-27: qty 10

## 2013-08-27 MED ORDER — SUCCINYLCHOLINE CHLORIDE 20 MG/ML IJ SOLN
INTRAMUSCULAR | Status: AC
  Filled 2013-08-27: qty 1

## 2013-08-27 MED ORDER — IMMUNE GLOBULIN (HUMAN) 20 GM/200ML IV SOLN
40.0000 g | INTRAVENOUS | Status: AC
  Administered 2013-08-27: 40 g via INTRAVENOUS
  Filled 2013-08-27: qty 400

## 2013-08-27 MED ORDER — FENTANYL CITRATE 0.05 MG/ML IJ SOLN
INTRAMUSCULAR | Status: AC
  Filled 2013-08-27: qty 2

## 2013-08-27 MED ORDER — BIOTENE DRY MOUTH MT LIQD
15.0000 mL | Freq: Four times a day (QID) | OROMUCOSAL | Status: DC
  Administered 2013-08-27 – 2013-09-03 (×24): 15 mL via OROMUCOSAL

## 2013-08-27 MED ORDER — ETOMIDATE 2 MG/ML IV SOLN
INTRAVENOUS | Status: AC
  Filled 2013-08-27: qty 20

## 2013-08-27 MED ORDER — DILTIAZEM HCL ER COATED BEADS 180 MG PO CP24
180.0000 mg | ORAL_CAPSULE | Freq: Every morning | ORAL | Status: DC
  Filled 2013-08-27: qty 1

## 2013-08-27 MED ORDER — CHLORHEXIDINE GLUCONATE 0.12 % MT SOLN: 15.0000 mL | Freq: Two times a day (BID) | OROMUCOSAL | Status: DC

## 2013-08-27 MED ORDER — FENTANYL CITRATE 0.05 MG/ML IJ SOLN
200.0000 ug | Freq: Once | INTRAMUSCULAR | Status: AC
  Administered 2013-08-27: 200 ug via INTRAVENOUS

## 2013-08-27 MED ORDER — PANTOPRAZOLE SODIUM 40 MG IV SOLR
40.0000 mg | Freq: Every day | INTRAVENOUS | Status: DC
  Administered 2013-08-27 – 2013-08-30 (×4): 40 mg via INTRAVENOUS
  Filled 2013-08-27 (×6): qty 40

## 2013-08-27 MED ORDER — HYDROMORPHONE HCL PF 2 MG/ML IJ SOLN
3.0000 mg | Freq: Once | INTRAMUSCULAR | Status: AC
  Administered 2013-08-27: 3 mg via INTRAVENOUS
  Filled 2013-08-27: qty 2

## 2013-08-27 MED ORDER — SODIUM CHLORIDE 0.9 % IV SOLN
0.0000 ug/h | INTRAVENOUS | Status: DC
  Administered 2013-08-27: 50 ug/h via INTRAVENOUS
  Administered 2013-08-27: 200 ug/h via INTRAVENOUS
  Administered 2013-08-27: 300 ug/h via INTRAVENOUS
  Administered 2013-08-28 (×2): 350 ug/h via INTRAVENOUS
  Administered 2013-08-28: 325 ug/h via INTRAVENOUS
  Filled 2013-08-27 (×7): qty 50

## 2013-08-27 MED ORDER — MIDAZOLAM HCL 2 MG/2ML IJ SOLN
1.0000 mg | Freq: Once | INTRAMUSCULAR | Status: AC
  Administered 2013-08-27: 1 mg via INTRAVENOUS

## 2013-08-27 MED ORDER — CHLORHEXIDINE GLUCONATE 0.12 % MT SOLN
15.0000 mL | Freq: Two times a day (BID) | OROMUCOSAL | Status: DC
  Administered 2013-08-27 – 2013-09-02 (×13): 15 mL via OROMUCOSAL
  Filled 2013-08-27 (×16): qty 15

## 2013-08-27 MED ORDER — MIDAZOLAM HCL 2 MG/2ML IJ SOLN
INTRAMUSCULAR | Status: AC
  Administered 2013-08-27: 4 mg
  Filled 2013-08-27: qty 4

## 2013-08-27 MED ORDER — MIDAZOLAM HCL 2 MG/2ML IJ SOLN
INTRAMUSCULAR | Status: AC
  Filled 2013-08-27: qty 2

## 2013-08-27 MED ORDER — FUROSEMIDE 10 MG/ML IJ SOLN
20.0000 mg | Freq: Once | INTRAMUSCULAR | Status: AC
  Administered 2013-08-27: 20 mg via INTRAVENOUS
  Filled 2013-08-27: qty 2

## 2013-08-27 NOTE — Progress Notes (Signed)
I have seen the patient and will leave a full note later.   Robert Berger

## 2013-08-27 NOTE — Progress Notes (Signed)
Pt was tachypneic with respiratory rate in the 30's and had accessory muscle use. RT talked pt into wearing Bi-PAP however pt was only able to tolerate Bi-PAP for about 25 mins. Pt is currently wearing a non-rebreather with SpO2 98%. RN notified.

## 2013-08-27 NOTE — Progress Notes (Signed)
Pt was unable to tolerate Bi-PAP with the settings 5/5 (NIV 10/5). Pt was given PO Ativan to help relax him while on Bi-PAP but still complains saying "it's too uncomfortable." Pt wore Bi-PAP for about 2 hours total. Pt is currently wearing a non-rebreather with SpO2 at 99%.

## 2013-08-27 NOTE — Consult Note (Signed)
Name: GRIER VU MRN: [id number] DOB: Apr 03, 1941    ADMISSION DATE:  08/25/2013 CONSULTATION DATE:  1/19  REFERRING MD :  Clementeen Graham  PRIMARY SERVICE: [facility]PCCM   CHIEF COMPLAINT:  Respiratory failure   BRIEF PATIENT DESCRIPTION:  73 year old male w/ recurrent IgG kappa myeloma. Last treated w/ Carfilzomib on 1/16 and cytoxan on 1/15. Admitted on 1/17 w/ progressive respiratory failure in setting of bilateral pulmonary infiltrates. PCCM asked to see on 1/19 as respiratory status continued to worsen in spite of broad spec abx.   SIGNIFICANT EVENTS / STUDIES:  Admitted 1/17, started broad spec abx for PNA CT chest 1/18: 1. There are new fluffy bilateral widespread pulmonary alveolar infiltrates consistent with pneumonia or pneumonitis. 2. There are new small bilateral pleural effusions layering posteriorly. 3. Stable findings of known skeletal and paravertebral involvement by the patient's known myeloma are demonstrated. 4. There is no evidence of an acute pulmonary embolism. 1/19: progressive respiratory failure, intubated.   LINES / TUBES: Port  CULTURES: u strep 1/17: neg u legionella 1/17 >>> Respiratory culture 1/19 >>> Resp viral panel 1/19 >>> PCP 1/19>>>  ANTIBIOTICS: azithro 1/17>>> Zovirax 1/17>>> azactam 1/17>>> vanc 1/17>>  HISTORY OF PRESENT ILLNESS:    73 y.o. male, history of multiple myeloma undergoing active chemotherapy and radiation under the care of Dr. 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 In ER had  chest x-ray was notable for bilateral fluffy infiltrates. Admitted for treatment of PNA. Got progressively worse in spite of broad spec abx. PCCM asked to see on 1/19 for worsening resp failure   PAST MEDICAL HISTORY :  Past Medical History  Diagnosis Date  . Arthritis   . GERD  (gastroesophageal reflux disease)   . Complication of anesthesia     aspiration during surgery, difficult to wake up  . Multiple myeloma   . Status post radiation therapy 03/23/12 - 04/05/12    T8 - L4/ 20 Gy/ 10 Fractions  . Dysphagia 05/06/12    ED Visit  . DVT (deep venous thrombosis)   . PE (pulmonary embolism)   . Cellulitis   . Abscess     Left arm and groin area   Past Surgical History  Procedure Laterality Date  . Hemorrhoid surgery    . Laparotomy  05/14/2012    Procedure: EXPLORATORY LAPAROTOMY;  Surgeon: Earnstine Regal, MD;  Location: [facility];  Service: General;  Laterality: N/A;  . Colostomy  05/14/2012    Procedure: COLOSTOMY;  Surgeon: Earnstine Regal, MD;  Location: [facility];  Service: General;  Laterality: N/A;  . Back surgery      47 years ago  . Fracture surgery      ankle   Prior to Admission medications   Medication Sig Start Date End Date Taking? Authorizing Provider  acyclovir (ZOVIRAX) 400 MG tablet Take 1 tablet (400 mg total) by mouth daily. 08/23/13  Yes Volanda Napoleon, MD  aspirin 81 MG tablet Take 162 mg by mouth every morning.    Yes Historical Provider, MD  baclofen (LIORESAL) 10 MG tablet Take 10 mg by mouth 3 (three) times daily. 02/26/13  Yes Debbrah Alar, NP  diltiazem (CARDIZEM CD) 120 MG 24 hr capsule Take 120 mg by mouth every morning. 02/26/13  Yes Debbrah Alar, NP  HYDROmorphone (DILAUDID) 2 MG tablet  Take 1 tablet (2 mg total) by mouth every 4 (four) hours as needed for severe pain. 08/24/13  Yes Volanda Napoleon, MD  lansoprazole (PREVACID) 30 MG capsule Take 30 mg by mouth daily at 12 noon.   Yes Historical Provider, MD  lidocaine-prilocaine (EMLA) cream Apply 1 application topically as needed (before chemo).   Yes Historical Provider, MD  lisinopril (PRINIVIL,ZESTRIL) 2.5 MG tablet Take 2.5 mg by mouth daily with breakfast.  01/30/13  Yes Historical Provider, MD  meloxicam (MOBIC) 15 MG tablet Take 15 mg by mouth every morning.  07/28/13  Yes  Volanda Napoleon, MD  ondansetron (ZOFRAN) 8 MG tablet Take 1 tablet (8 mg total) by mouth 2 (two) times daily. Take two times a day starting the day after chemo for 2 days. Then take two times a day as needed for nausea or vomiting. 08/23/13  Yes Volanda Napoleon, MD  predniSONE (DELTASONE) 20 MG tablet Take 10 mg by mouth daily with breakfast. Take 1 pill ady with food. 07/25/13  Yes Volanda Napoleon, MD  Pyridoxine HCl (VITAMIN B-6) 250 MG tablet Take 250 mg by mouth every morning.  06/21/13  Yes Volanda Napoleon, MD  fentaNYL (DURAGESIC) 25 MCG/HR patch Place 1 patch (25 mcg total) onto the skin every 3 (three) days. 08/23/13   Volanda Napoleon, MD  LORazepam (ATIVAN) 0.5 MG tablet Take 1 tablet (0.5 mg total) by mouth every 6 (six) hours as needed (Nausea or vomiting). 08/23/13   Volanda Napoleon, MD  prochlorperazine (COMPAZINE) 10 MG tablet Take 1 tablet (10 mg total) by mouth every 6 (six) hours as needed (Nausea or vomiting). 08/23/13   Volanda Napoleon, MD   Allergies  Allergen Reactions  . Penicillins Nausea And Vomiting, Swelling and Rash    FAMILY HISTORY:  Family History  Problem Relation Age of Onset  . Cancer Maternal Grandfather   . Cancer Maternal Grandmother    SOCIAL HISTORY:  reports that he quit smoking about 35 years ago. His smoking use included Cigarettes. He smoked 0.00 packs per day. He has never used smokeless tobacco. He reports that he does not drink alcohol or use illicit drugs.  REVIEW OF SYSTEMS:  unable  SUBJECTIVE:  In acute distress  VITAL SIGNS: Temp:  [97.7 F (36.5 C)-99.8 F (37.7 C)] 99.8 F (37.7 C) (01/19 1005) Pulse Rate:  [99-138] 124 (01/19 1005) Resp:  [20-32] 24 (01/19 1005) BP: (124-163)/(64-86) 129/71 mmHg (01/19 1005) SpO2:  [86 %-99 %] 94 % (01/19 1005) FiO2 (%):  [80 %-100 %] 100 % (01/19 0831) Weight:  [89.9 kg (198 lb 3.1 oz)] 89.9 kg (198 lb 3.1 oz) (01/19 0400) HEMODYNAMICS:   VENTILATOR SETTINGS: Vent Mode:  [-] BIPAP FiO2 (%):   [80 %-100 %] 100 % Set Rate:  [10 bmp] 10 bmp PEEP:  [5 cmH20] 5 cmH20 INTAKE / OUTPUT: Intake/Output     01/18 0701 - 01/19 0700 01/19 0701 - 01/20 0700   P.O. 960    I.V. (mL/kg) 223 (2.5)    Blood  162.5   IV Piggyback 1400    Total Intake(mL/kg) 2583 (28.7) 162.5 (1.8)   Urine (mL/kg/hr) 2450 (1.1)    Total Output 2450     Net +133 +162.5          PHYSICAL EXAMINATION: General:  Acutely ill appearing male in acute distress  Neuro:  Difficult to arouse follows commands  HEENT:  Mucous membranes moist, poor dentition  Cardiovascular:  Tachy  rrr  Lungs:  Marked accessory muscle use, crackles posteriorly  Abdomen:  Soft, non-tender, colostomy pink  Musculoskeletal:  Intact  Skin:  Dry w/out sig edema   LABS:  CBC  Recent Labs Lab 08/26/13 0258 08/26/13 1410 08/27/13 0540  WBC 7.0 7.4 7.3  HGB 7.7* 7.5* 7.2*  HCT 23.4* 22.9* 21.7*  PLT 17* 15* 13*   Coag's  Recent Labs Lab 08/21/13 1254 08/26/13 1410  APTT 28 28  INR 1.01 1.12   BMET  Recent Labs Lab 08/23/13 1229 08/25/13 1450 08/25/13 2140 08/26/13 0258  NA 135* 138  --  136*  K 3.9 4.8  --  4.2  CL  --  105  --  102  CO2 22 24  --  23  BUN 9.8 30*  --  23  CREATININE 0.9 0.90 0.83 0.82  GLUCOSE 153* 109*  --  119*   Electrolytes  Recent Labs Lab 08/23/13 1229 08/25/13 1450 08/26/13 0258  CALCIUM 8.8 8.0* 7.7*   Sepsis Markers  Recent Labs Lab 08/25/13 1349  LATICACIDVEN 2.80*   ABG  Recent Labs Lab 08/26/13 1825 08/27/13 0131 08/27/13 0807  PHART 7.455* 7.431 7.400  PCO2ART 34.8* 39.5 42.6  PO2ART 56.2* 245.0* 81.2   Liver Enzymes  Recent Labs Lab 08/23/13 1229 08/25/13 1450  AST 51* 33  ALT 34 23  ALKPHOS 176* 123*  BILITOT 0.63 0.3  ALBUMIN 2.5* 2.2*   Cardiac Enzymes  Recent Labs Lab 08/25/13 2140 08/25/13 2145 08/26/13 0233 08/26/13 0840  TROPONINI  --  <0.30 <0.30 <0.30  PROBNP 437.4*  --   --   --    Glucose No results found for this basename:  GLUCAP,  in the last 168 hours  Imaging    CXR: Progressive bilateral infiltrates on CT chest   ASSESSMENT / PLAN:  PULMONARY A: Acute respiratory failure in setting of progressive bilateral pulmonary infiltrates.  >highest on diff dx include progressive PNA vs drug related pneumonitis. He has failed NIPPV. Has progressive respiratory failure in spite of broad spec abx  P:   Intubate ARDS protocol Get BAL after platelets  Cont broad spec abx (see ID section) If no improvement in 24hrs will start  steroid trial   CARDIOVASCULAR A:  ST In the setting of respiratory failure  P:  Tele Supportive care   RENAL A:   No acute issue  P:   Renal dose meds  Keep even to neg volume status   GASTROINTESTINAL A:   H/o diverticular disease w/ colostomy  P:   SUP PPI   HEMATOLOGIC A:   Recurrent IgG kappa Meyloma H/o DVT/PE (lastest CT neg) Anemia  Thrombocytopenia  P:  Change PRBCs to 1 unit Transfuse platelets  PPI  SCDs   INFECTIOUS A:  Pneumonia  P:   F/u u legionella BAL after platelets  Cont broad spec abx  Cycle PCTs   ENDOCRINE A:   Chronic pred P:   Trend hemodynamics   NEUROLOGIC A:  Acute encephalopathy, drug and hypoxia related  P:   Supportive care   Mother updated at bedside  TODAY'S SUMMARY: Has gotten progressively worse. Not sure if this is infection or pneumonitis (drug induced). His thrombocytopenia will delay getting BAL. Will intubate. Get BAL. If not better in 24 hours will consider Steroids.   I have personally obtained a history, examined the patient, evaluated laboratory and imaging results, formulated the assessment and plan and placed orders. CRITICAL CARE: The patient is critically ill with  multiple organ systems failure and requires high complexity decision making for assessment and support, frequent evaluation and titration of therapies, application of advanced monitoring technologies and extensive interpretation of multiple  databases. Critical Care Time devoted to patient care services described in this note is 45 minutes.    Jillyn Hidden PCCM Pager: [phone] Cell: [phone] If no response, call [phone]   08/27/2013, 10:11 AM

## 2013-08-27 NOTE — Progress Notes (Signed)
[place]  [phone]    HOSPITAL PROGRESS NOTE  HPI: 73 year old white  male with a history of recurrent IgG kappa myeloma. Last treated w/ Carfilzomib on 1/16 and cytoxan on 1/15.  He was admitted on 1/17 with  progressive respiratory failure in setting of bilateral pulmonary infiltrates. Despite IV antibiotics, his pulmonary status continue to worsen, requiring CCM involvement. CT chest 1/18 confirmed fluffy bilateral widespread pulmonary alveolar infiltrates consistent with pneumonia or pneumonitis, new small bilateral pleural effusions layering posteriorly and stable  known skeletal and paravertebral involvement by the patient's known myeloma No evidence of an acute pulmonary embolism. Due to progressive respiratory failure on 1/19,  He was intubated. We have been informed of the patient's admission.     MEDICATIONS:  . acyclovir  400 mg Oral Daily  . antiseptic oral rinse  15 mL Mouth Rinse QID  . azithromycin  500 mg Intravenous Q24H  . aztreonam  1 g Intravenous Q8H  . chlorhexidine  15 mL Mouth Rinse BID  . etomidate      . etomidate      . etomidate  20 mg Intravenous Once  . fentaNYL      . fentaNYL      . fentaNYL  200 mcg Intravenous Once  . fentaNYL  50 mcg Intravenous Once  . lidocaine (cardiac) 100 mg/71ml      . lidocaine (cardiac) 100 mg/23ml      . midazolam      . midazolam  1 mg Intravenous Once  . pantoprazole (PROTONIX) IV  40 mg Intravenous Daily  . [START ON 08/28/2013] predniSONE  10 mg Per Tube Q breakfast  . rocuronium      . rocuronium      . sodium chloride  3 mL Intravenous Q12H  . succinylcholine      . succinylcholine      . vancomycin  1,500 mg Intravenous Q12H    albuterol, fentaNYL, ondansetron (ZOFRAN) IV, sodium chloride   ALLERGIES:   Allergies  Allergen Reactions  . Penicillins Nausea And Vomiting, Swelling and Rash     PHYSICAL EXAMINATION:   Filed Vitals:   08/27/13 1130  BP: 123/65  Pulse:  116  Temp:   Resp: 20   Filed Weights   08/25/13 1900 08/26/13 0725 08/27/13 0400  Weight: 198 lb 6.6 oz (90 kg) 202 lb 13.2 oz (92 kg) 198 lb 3.1 oz (89.74 kg)    73 year old  in no acute distress, ill appearing,intubated, sedated HEENT: Normocephalic, atraumatic,eyes closed. Oral cavity without thrush or lesions. Neck supple. no thyromegaly, no cervical or supraclavicular adenopathy  Lungs  No wheezing, rhonchi , rales at bases. accessory muscle use Cardiac: regular rate and rhythm,no murmur , rubs or gallops Abdomen soft nontender , bowel sounds x4. Colostomy site present. Extremities no clubbing cyanosis or edema. No bruising or petechial rash Neuro: non focal  LABORATORY/RADIOLOGY DATA:   Recent Labs Lab 08/25/13 1450 08/25/13 2140 08/26/13 0258 08/26/13 1410 08/27/13 0540  WBC 6.2 5.8 7.0 7.4 7.3  HGB 7.7* 7.5* 7.7* 7.5* 7.2*  HCT 23.2* 22.8* 23.4* 22.9* 21.7*  PLT 19* 20* 17* 15* 13*  MCV 95.5 95.4 94.7 95.0 95.2  MCH 31.7 31.4 31.2 31.1 31.6  MCHC 33.2 32.9 32.9 32.8 33.2  RDW 20.6* 20.3* 20.3* 20.1* 20.0*  LYMPHSABS 1.3  --   --   --   --   MONOABS 1.1*  --   --   --   --  EOSABS 0.0  --   --   --   --   BASOSABS 0.0  --   --   --   --     CMP    Recent Labs Lab 08/23/13 1229 08/25/13 1450 08/25/13 2140 08/26/13 0258  NA 135* 138  --  136*  K 3.9 4.8  --  4.2  CL  --  105  --  102  CO2 22 24  --  23  GLUCOSE 153* 109*  --  119*  BUN 9.8 30*  --  23  CREATININE 0.9 0.90 0.83 0.82  CALCIUM 8.8 8.0*  --  7.7*  AST 51* 33  --   --   ALT 34 23  --   --   ALKPHOS 176* 123*  --   --   BILITOT 0.63 0.3  --   --         Component Value Date/Time   BILITOT 0.3 08/25/2013 1450   BILITOT 0.63 08/23/2013 1229   BILITOT 0.70 08/13/2013 1317   BILIDIR 0.9* 03/18/2013 2339   IBILI 0.5 03/18/2013 2339    Anemia panel:    Recent Labs  08/25/13 2145  VITAMINB12 648  FOLATE 12.5  FERRITIN 1781*  TIBC 170*  IRON 73  RETICCTPCT 1.3    No results  found for this basename: TSH, T4TOTAL, FREET3, T3FREE, THYROIDAB,  in the last 72 hours   No results found for this basename: esrsedrate     Recent Labs Lab 08/21/13 1254 08/26/13 1410  INR 1.01 1.12      Urinalysis    Component Value Date/Time   COLORURINE AMBER* 08/20/2013 0204   APPEARANCEUR CLOUDY* 08/20/2013 0204   LABSPEC 1.026 08/20/2013 0204   PHURINE 5.5 08/20/2013 0204   GLUCOSEU 100* 08/20/2013 0204   HGBUR NEGATIVE 08/20/2013 0204   BILIRUBINUR NEGATIVE 08/20/2013 0204   KETONESUR NEGATIVE 08/20/2013 0204   PROTEINUR 100* 08/20/2013 0204   UROBILINOGEN 1.0 08/20/2013 0204   NITRITE NEGATIVE 08/20/2013 0204   LEUKOCYTESUR NEGATIVE 08/20/2013 0204    Drugs of Abuse  No results found for this basename: labopia, cocainscrnur, labbenz, amphetmu, thcu, labbarb     Liver Function Tests:  Recent Labs Lab 08/23/13 1229 08/25/13 1450  AST 51* 33  ALT 34 23  ALKPHOS 176* 123*  BILITOT 0.63 0.3  PROT 8.9* 7.9  ALBUMIN 2.5* 2.2*   No results found for this basename: LIPASE, AMYLASE,  in the last 168 hours No results found for this basename: AMMONIA,  in the last 168 hours  CBG:  Recent Labs Lab 08/27/13 1247  GLUCAP 100*   Hgb A1c No results found for this basename: HGBA1C,  in the last 72 hours  Cardiac Enzymes:  Recent Labs Lab 08/25/13 2145 08/26/13 0233 08/26/13 0840  TROPONINI <0.30 <0.30 <0.30    BNP: No components found with this basename: POCBNP,   D-Dimer No results found for this basename: DDIMER,  in the last 72 hours  Thyroid function studies No results found for this basename: TSH, T4TOTAL, FREET3, T3FREE, THYROIDAB,  in the last 72 hours  Radiology Studies:  Dg Chest 2 View  08/25/2013   CLINICAL DATA:  Hypoxia, dyspnea, and chest discomfort with history of multiple myeloma and pulmonary embolism. Marland Kitchen  EXAM: CHEST  2 VIEW  COMPARISON:  CT scan of the chest dated August 20, 2013.  FINDINGS: Since the previous study there have  developed fluffy alveolar infiltrates bilaterally. The cardiopericardial silhouette does not appear enlarged.  There is no pleural effusion. The mediastinum is normal in width. The Port-A-Cath appliance is unchanged in appearance. The patient has undergone kyphoplasty at the T10 through L1 levels.  IMPRESSION: 1. New fluffy alveolar densities in both lungs are worrisome for pneumonia. CHF is not problematic Lee excluded. 2. There is no evidence of a pleural effusion. 3. The patient has known paravertebral soft tissue masses consistent with myeloma. These are not clearly evident on today's study.   Electronically Signed   By: David  [place]   On: 08/25/2013 13:26   Ct Angio Chest Pe W/cm &/or Wo Cm  08/26/2013   CLINICAL DATA:  Unexplained persistent dyspnea and tachycardia, history of multiple myeloma and previous history of pulmonary embolism.  EXAM: CT ANGIOGRAPHY CHEST WITH CONTRAST  TECHNIQUE: Multidetector CT imaging of the chest was performed using the standard protocol during bolus administration of intravenous contrast. Multiplanar CT image reconstructions including MIPs were obtained to evaluate the vascular anatomy.  CONTRAST:  OMNIPAQUE IOHEXOL 350 MG/ML SOLN  COMPARISON:  PA and lateral chest x-ray of February 22, 2014 and CT scan of the chest dated August 20, 2013  FINDINGS: Contrast within the pulmonary arterial tree is normal in appearance. There are no filling defects to suggest an acute pulmonary embolism. Review of the MIP images confirms the above findings. 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 The caliber of the thoracic aorta is normal. Since the previous study small bilateral pleural effusions have developed. There is no pericardial effusion. The cardiac chambers are normal in size. There is no bulky mediastinal nor hilar lymphadenopathy. There is chronic enlargement of the left thyroid lobe. Again demonstrated is abnormal soft tissue density in the left paravertebral region at approximately  T8 and T9.  At lung window settings there has been interval development of fluffy partially confluent bilateral alveolar infiltrates. The upper lobes appear to be the most conspicuously involved.  Known skeletal involvement by the patient's multiple myeloma is again demonstrated.  Within the upper abdomen the observed portions of the liver appear normal. There are tiny radiodensities associated with the dependent portion of the gallbladder near the neck. The spleen is subjectively enlarged.  IMPRESSION: 1. There are new fluffy bilateral widespread pulmonary alveolar infiltrates consistent with pneumonia or pneumonitis. 2. There are new small bilateral pleural effusions layering posteriorly. 3. Stable findings of known skeletal and paravertebral involvement by the patient's known myeloma are demonstrated. 4. There is no evidence of an acute pulmonary embolism.   Electronically Signed   By: David  [place]   On: 08/26/2013 17:07   Ct Angio Chest Pe W/cm &/or Wo Cm  08/20/2013   CLINICAL DATA:  Shortness of breath and fatigue. History of DVT. Multiple myeloma.  EXAM: CT ANGIOGRAPHY CHEST WITH CONTRAST  TECHNIQUE: Multidetector CT imaging of the chest was performed using the standard protocol during bolus administration of intravenous contrast. Multiplanar CT image reconstructions including MIPs were obtained to evaluate the vascular anatomy.  CONTRAST:  OMNIPAQUE IOHEXOL 350 MG/ML SOLN  COMPARISON:  05/13/2012  FINDINGS: THORACIC INLET/BODY WALL:  Enlargement of the left lobe thyroid gland with coarse calcifications, stable from 2013.  MEDIASTINUM:  Normal heart size. No pericardial effusion. Multi focal coronary artery atherosclerosis. No acute vascular abnormality, including pulmonary embolism or aortic dissection. No adenopathy.  LUNG WINDOWS:  No consolidation.  No effusion.  No suspicious pulmonary nodule.  UPPER ABDOMEN:  No acute findings.  OSSEOUS:  There are scattered lytic lesions that the imaged  skeleton, consistent with  myeloma. No acute fracture appreciated. The most notable findings in the thoracic spine is extensive osteolysis of the T9, T10, and T11 bodies, encompassing previously placed bone cement. There is increasing paravertebral soft tissue mass, bilaterally T9-10 and on the left at T8. The nodule at the level of T8 measures 18 mm. Although anemic, the hemo below levels are not as low as would be expected for extramedullary hematopoiesis. Additionally, the paraspinal abnormalities are limited to levels with definite myeloma this involvement of the bone. Question new growth into the posterior canal at the level of T10. No acute fracture appreciated. Other notable lytic areas within the manubrium and T3 body.  Review of the MIP images confirms the above findings.  IMPRESSION: 1. Negative for pulmonary embolism or other acute intrathoracic abnormality. 2. Multiple myeloma. Compared to thoracic spine MRI 07/27/2013, new/larger paravertebral extension at the level of T8 and T9/10. If there are new thoracic cord symptoms, MRI could re-evaluate the spinal canal at T10.   Electronically Signed   By: Jorje Guild M.D.   On: 08/20/2013 03:41   Ct Forearm Left W Contrast  07/30/2013   CLINICAL DATA:  Forearm abscess.  Cellulitis.  Fasciitis.  EXAM: CT OF THE LEFT FOREARM WITH CONTRAST  TECHNIQUE: Multidetector CT imaging was performed following the standard protocol during bolus administration of intravenous contrast.  CONTRAST:  128mL OMNIPAQUE IOHEXOL 300 MG/ML  SOLN  COMPARISON:  None.  FINDINGS: Study is technically degraded. The arm had to be scanned over the patient's abdomen due to body habitus. There is no gas identified in the soft tissues of the forearm. No discrete fluid collection is identified. Radius, ulna and humerus appear normal. The visualize carpal bones also appear normal.  IMPRESSION: Technically degraded study by positioning and body habitus. No gas within the soft tissues to  suggest fasciitis. No abscess.   Electronically Signed   By: Dereck Ligas M.D.   On: 07/30/2013 23:24   US Venous Img Upper Uni Left  07/30/2013   CLINICAL DATA:  Left forearm pain swelling and redness history of pulmonary embolus and previous inferior vena caval filter placement.  EXAM: Left UPPER EXTREMITY VENOUS DOPPLER ULTRASOUND  TECHNIQUE: Gray-scale sonography with graded compression, as well as color Doppler and duplex ultrasound were performed to evaluate the upper extremity deep venous system from the level of the subclavian vein and including the jugular, axillary, basilic and upper cephalic vein. Spectral Doppler was utilized to evaluate flow at rest and with distal augmentation maneuvers.  COMPARISON:  None.  FINDINGS: Thrombus within deep veins:  None visualized.  Compressibility of deep veins:  Normal.  Duplex waveform respiratory phasicity:  Normal.  Duplex waveform response to augmentation:  Normal.  Venous reflux:  None visualized.  Other findings: There is mild superficial soft tissue edema over the forearm. Incidental note is made of possible nodules within the left thyroid lobe.  IMPRESSION: 1. There is no evidence of deep venous thrombosis within the left upper extremity. 2. There is subcutaneous edema. 3. There may be a nodule or nodules in the left thyroid lobe.   Electronically Signed   By: David  [place]   On: 07/30/2013 09:25   Ir Fluoro Guide Cv Line Right  08/21/2013   INDICATION: History of multiple myeloma, in need of intravenous access for the initiation of chemotherapy.  EXAM: IMPLANTED PORT A CATH PLACEMENT WITH ULTRASOUND AND FLUOROSCOPIC GUIDANCE  MEDICATIONS: Vancomycin 1 g IV; IV antibiotic was given in an appropriate time interval prior to  skin puncture.  ANESTHESIA/SEDATION: Versed 3 mg IV; Fentanyl 100 mcg IV;  Total Moderate Sedation Time  30  minutes.  CONTRAST:  None  COMPARISON:  None.  FLUOROSCOPY TIME:  48 seconds.  PROCEDURE: The procedure, risks, benefits,  and alternatives were explained to the patient. Questions regarding the procedure were encouraged and answered. The patient understands and consents to the procedure.  The right neck and chest were prepped with chlorhexidine in a sterile fashion, and a sterile drape was applied covering the operative field. Maximum barrier sterile technique with sterile gowns and gloves were used for the procedure. A timeout was performed prior to the initiation of the procedure. Local anesthesia was provided with 1% lidocaine with epinephrine.  After creating a small venotomy incision, a micropuncture kit was utilized to access the internal jugular vein under direct, real-time ultrasound guidance. Ultrasound image documentation was performed. The microwire was kinked to measure appropriate catheter length.  A subcutaneous port pocket was then created along the upper chest wall utilizing a combination of sharp and blunt dissection. The pocket was irrigated with sterile saline. A single lumen ISP power injectable port was chosen for placement. The 8 Fr catheter was tunneled from the port pocket site to the venotomy incision. The port was placed in the pocket. The external catheter was trimmed to appropriate length. At the venotomy, an 8 Fr peel-away sheath was placed over a guidewire under fluoroscopic guidance. The catheter was then placed through the sheath and the sheath was removed. Final catheter positioning was confirmed and documented with a fluoroscopic spot radiograph. The port was accessed with a Huber needle, aspirated and flushed with heparinized saline.  The venotomy site was closed with an interrupted 4-0 Vicryl suture. The port pocket incision was closed with interrupted 2-0 Vicryl suture and the skin was opposed with a running subcuticular 4-0 Vicryl suture. Dermabond and Steri-strips were applied to both incisions. Dressings were placed. The patient tolerated the procedure well without immediate post procedural  complication.  COMPLICATIONS: None immediate  FINDINGS: After catheter placement, the tip lies within the superior cavoatrial junction. The catheter aspirates and flushes normally and is ready for immediate use.  IMPRESSION: Successful placement of a right internal jugular approach power injectable Port-A-Cath. The catheter is ready for immediate use.   Electronically Signed   By: Sandi Mariscal M.D.   On: 08/21/2013 16:05   Ir US Guide Vasc Access Right  08/21/2013   INDICATION: History of multiple myeloma, in need of intravenous access for the initiation of chemotherapy.  EXAM: IMPLANTED PORT A CATH PLACEMENT WITH ULTRASOUND AND FLUOROSCOPIC GUIDANCE  MEDICATIONS: Vancomycin 1 g IV; IV antibiotic was given in an appropriate time interval prior to skin puncture.  ANESTHESIA/SEDATION: Versed 3 mg IV; Fentanyl 100 mcg IV;  Total Moderate Sedation Time  30  minutes.  CONTRAST:  None  COMPARISON:  None.  FLUOROSCOPY TIME:  48 seconds.  PROCEDURE: The procedure, risks, benefits, and alternatives were explained to the patient. Questions regarding the procedure were encouraged and answered. The patient understands and consents to the procedure.  The right neck and chest were prepped with chlorhexidine in a sterile fashion, and a sterile drape was applied covering the operative field. Maximum barrier sterile technique with sterile gowns and gloves were used for the procedure. A timeout was performed prior to the initiation of the procedure. Local anesthesia was provided with 1% lidocaine with epinephrine.  After creating a small venotomy incision, a micropuncture kit was utilized to access the  internal jugular vein under direct, real-time ultrasound guidance. Ultrasound image documentation was performed. The microwire was kinked to measure appropriate catheter length.  A subcutaneous port pocket was then created along the upper chest wall utilizing a combination of sharp and blunt dissection. The pocket was irrigated with  sterile saline. A single lumen ISP power injectable port was chosen for placement. The 8 Fr catheter was tunneled from the port pocket site to the venotomy incision. The port was placed in the pocket. The external catheter was trimmed to appropriate length. At the venotomy, an 8 Fr peel-away sheath was placed over a guidewire under fluoroscopic guidance. The catheter was then placed through the sheath and the sheath was removed. Final catheter positioning was confirmed and documented with a fluoroscopic spot radiograph. The port was accessed with a Huber needle, aspirated and flushed with heparinized saline.  The venotomy site was closed with an interrupted 4-0 Vicryl suture. The port pocket incision was closed with interrupted 2-0 Vicryl suture and the skin was opposed with a running subcuticular 4-0 Vicryl suture. Dermabond and Steri-strips were applied to both incisions. Dressings were placed. The patient tolerated the procedure well without immediate post procedural complication.  COMPLICATIONS: None immediate  FINDINGS: After catheter placement, the tip lies within the superior cavoatrial junction. The catheter aspirates and flushes normally and is ready for immediate use.  IMPRESSION: Successful placement of a right internal jugular approach power injectable Port-A-Cath. The catheter is ready for immediate use.   Electronically Signed   By: Simonne Come M.D.   On: 08/21/2013 16:05   Ct Biopsy  07/31/2013   INDICATION: Evaluate for progression of multiple myeloma  EXAM: CT BIOPSY  MEDICATIONS: Fentanyl 100 mcg IV; Versed 2 mg IV  ANESTHESIA/SEDATION: Sedation Time  10 minutes  CONTRAST:  None  PROCEDURE: Informed consent was obtained from the patient following an explanation of the procedure, risks, benefits and alternatives. The patient understands, agrees and consents for the procedure. All questions were addressed. A time out was performed prior to the initiation of the procedure. The patient was  positioned prone and noncontrast localization CT was performed of the pelvis to demonstrate the iliac marrow spaces. The operative site was prepped and draped in the usual sterile fashion.  Under sterile conditions and local anesthesia, an 11 gauge coaxial bone biopsy needle was advanced into the left iliac marrow space. Needle position was confirmed with CT imaging. Initially, bone marrow aspiration was performed. Next, a bone marrow biopsy was obtained with the 11 gauge outer bone marrow device. Samples were prepared with the cytotechnologist and deemed adequate. The needle was removed intact. Hemostasis was obtained with compression and a dressing was placed. The patient tolerated the procedure well without immediate post procedural complication.  COMPLICATIONS: None immediate.  IMPRESSION: Successful CT guided left iliac bone marrow aspiration and core biopsy.   Electronically Signed   By: Simonne Come M.D.   On: 07/31/2013 10:49   Portable Chest Xray  08/27/2013   CLINICAL DATA:  Acute respiratory failure. On ventilator. Healthcare associated pneumonia. Multiple myeloma.  EXAM: PORTABLE CHEST - 1 VIEW  COMPARISON:  08/25/2013  FINDINGS: Right-sided power port remains in place. New endotracheal tube is seen with tip approximately 2.5 cm above the carina.  Significant interval worsening of diffuse bilateral airspace disease is seen since previous study. Low lung volumes again noted. No evidence of pneumothorax or pleural effusion. Heart size is normal.  IMPRESSION: Endotracheal tube in appropriate position. Significant worsening of diffuse bilateral  airspace disease.   Electronically Signed   By: Earle Gell M.D.   On: 08/27/2013 11:58       ASSESSMENT AND PLAN:   1. Recurrent IgG kappa Myeloma. S/p Carfilzomib on 1/16 and cytoxan on 1/15. Dr. Marin Olp to write an addendum to this note 2. /o DVT/PE, with last films, CT negative for PE 3. Anemia and thrombocytopenia,in the setting of recent chemo and  infection as well as dilution and polypharmacy,  s/p 1 unit of blood. Platelets to be given today. DVT prophylaxis with SCDs 4. Acute respiratory failure in setting of progressive bilateral pulmonary infiltrates, on broad spectrum antibiotics. If no improvement to initiate trial steroids.He is intubated and sedated 5. Acute encephahlopathy, as per admitting team 5. History of diverticular disease s/p colostomy  WERTMAN,SARA E, PA-C 08/27/2013, 1:09 PM   ADDENDUM:  I have seen and examined Mr.  Wholey. I really assessment above. Mr. Marvin has had myeloma since July of 2013. He is done very well. When he presented, he basically was bedbound due to too extensive spinal metastases. He recovered nicely. He improved with therapy. He was actually doing very well. Then he had a very rapid relapse in December. He had extensive spinal involvement again. He had radiation therapy. I was considering kyphoplasty if possible.  His myeloma has clearly become much more aggressive. Cytogenetics on the most recent bone marrow showed about 13 different abnormalities. 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  He was started on systemic chemotherapy. I think he's had 2 weeks' worth.  His immune system is compromised significantly. As such, we'll give him IVIG.  He had a very good performance status before he got his recurrence. He is walking. He wanted back to work. His blood counts look really good. His myeloma numbers showed a very nice suppression of his M. protein.  This is a very difficult problem right now. I hope that he can get through this. If he can get through this, I'm not sure how well we will be able to treat the myeloma recurrence.  I had planned to do some scans on him while in the hospital. I wanted to see how his spine had a responded to his radiation.  I agree with being aggressive right now. I think this certainly would be  appropriate since Mr. Bovenzi has been doing fairly nicely.  I left a message for his "significant other" and has been very involved with his care. She is aware of his status. I left a message for her by phone this afternoon.  I very much appreciate the outstanding care from the hospitalist and critical care staff.  We will watch his blood counts closely and transfuse appropriately.  I talked to him this morning before he was intubated. I gave him an update as to what I thought was the issues. He is a very knowledgeable man and clearly has a good understanding of the difficult nature of his myeloma right now.  We will definitely follow along closely.  Harriette Ohara 17:14

## 2013-08-27 NOTE — Progress Notes (Addendum)
[facility] PROGRESS NOTE  Robert Berger BRA:[id number] DOB: March 20, 1941 DOA: 08/25/2013 PCP: Nance Pear., NP   Brief narrative  73 y.o. male, history of multiple myeloma undergoing active chemotherapy and radiation under the care of Dr. 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 he continued to get progressively more short of breath went to the ER again on 1/17 where chest x-ray was adjusted of bilateral fluffy infiltrate pneumonia was the main suspicion. He was then transferred to [facility]  for further care.     Assessment/Plan:  Acuter hypoxic respiratory failure  in the setting of underlying PNA. Flu PCR negative. No clear sign of CHF on exam and 2D echo normal. . Monitor I/O.  Patient has been persistently tachycardic and tachypnic with increased work of breathing. This is due to his underlying PNA and underlying back pain as well.  CT angio chest repeated and negative for PE.  Transferred to stepdown . Could not tolerate BiPAP. Now on NRB maintaining o2 days in mid 90s but still with increase work of breathing.  ordered another dose of dilaudid and ativan for anxiety. ABG normal continue empiric abx.  Follow blood cx, urine legionella ag. strep ag  -monitor in stepdown continue scheduled nebs  -will request PCCM consult   Multiple myeloma  s/p chemo and radiation. Currently getting chemo. follows with Dr Marin Olp  Continue prophylactic acyclovir, prednisone  and narcotics for back pain.  Dr Marin Olp saw pt this am.  Has thrombocytopenia on presentation. . Does not need transfusion yet. likely due to recent chemo. Will monitor.    Back pain  secondary  to MM  On dilaudid at home. Uncontrolled.  Placed on PRN IV dilaudid and long acting oxycodone. contineu fentanyl patch. May need PCA if pain control continues to be an issue.   Anemia and thrombocytopenia  secondary to MM and recent chemo. Hb 7.2 this am. Ordered 2 U PRBC. Monitor platelets   Code Status: full code  Family Communication: none at bedside  Disposition Plan: liekly home once improved  Consultants:  Dr Marin Olp   Procedures:  None   Antibiotics:  IV vanco, aztreonam and azithromycin ( 1/17>.)   Diet: cardiac  DVT prophylaxis: SCD    HPI/Subjective:  Patient tachypnic and tachycardic overnight with increased work of breathing. transferred to stepdown for BiPAP. Could not tolerate it and placed on NRB. patient continues to be tachypnic and tachycardic  and co back pain   Objective: Filed Vitals:   08/27/13 0600  BP: 163/68  Pulse: 115  Temp:   Resp: 32    Intake/Output Summary (Last 24 hours) at 08/27/13 0837 Last data filed at 08/27/13 0600  Gross per 24 hour  Intake   2583 ml  Output   2450 ml  Net    133 ml   Filed Weights   08/25/13 1900 08/26/13 0725 08/27/13 0400  Weight: 90 kg (198 lb 6.6 oz) 92 kg (202 lb 13.2 oz) 89.9 kg (198 lb 3.1 oz)    Exam: General: Elderly male lying in bed in distress due to pain , tachypnic  HEENT: no pallor, moist mucosa  chest:  , diminished breath sounds at bases. No  rhonchi or wheeze  Cardiovascular: S1&S2 tachycardic, No murmurs, rubs or gallop  abd: soft, NT, ND, colostomy bag in place, BS+  Ext: warm, no edema  CNS: AAOX3   Data Reviewed: Basic Metabolic Panel:  Recent Labs Lab 08/23/13 1229 08/25/13 1450 08/25/13 2140 08/26/13 0258  NA 135* 138  --  136*  K 3.9 4.8  --  4.2  CL  --  105  --  102  CO2 22 24  --  23  GLUCOSE 153* 109*  --  119*  BUN 9.8 30*  --  23  CREATININE 0.9 0.90 0.83 0.82  CALCIUM 8.8 8.0*  --  7.7*   Liver Function Tests:  Recent Labs Lab 08/23/13 1229  08/25/13 1450  AST 51* 33  ALT 34 23  ALKPHOS 176* 123*  BILITOT 0.63 0.3  PROT 8.9* 7.9  ALBUMIN 2.5* 2.2*   No results found for this basename: LIPASE, AMYLASE,  in the last 168 hours No results found for this basename: AMMONIA,  in the last 168 hours CBC:  Recent Labs Lab 08/25/13 1450 08/25/13 2140 08/26/13 0258 08/26/13 1410 08/27/13 0540  WBC 6.2 5.8 7.0 7.4 7.3  NEUTROABS 3.8  --   --   --   --   HGB 7.7* 7.5* 7.7* 7.5* 7.2*  HCT 23.2* 22.8* 23.4* 22.9* 21.7*  MCV 95.5 95.4 94.7 95.0 95.2  PLT 19* 20* 17* 15* PENDING   Cardiac Enzymes:  Recent Labs Lab 08/25/13 2145 08/26/13 0233 08/26/13 0840  TROPONINI <0.30 <0.30 <0.30   BNP (last 3 results)  Recent Labs  08/25/13 2140  PROBNP 437.4*   CBG: No results found for this basename: GLUCAP,  in the last 168 hours  No results found for this or any previous visit (from the past 240 hour(s)).   Studies: Dg Chest 2 View  08/25/2013   CLINICAL DATA:  Hypoxia, dyspnea, and chest discomfort with history of multiple myeloma and pulmonary embolism. Marland Kitchen  EXAM: CHEST  2 VIEW  COMPARISON:  CT scan of the chest dated August 20, 2013.  FINDINGS: Since the previous study there have developed fluffy alveolar infiltrates bilaterally. The cardiopericardial silhouette does not appear enlarged. There is no pleural effusion. The mediastinum is normal in width. The Port-A-Cath appliance is unchanged in appearance. The patient has undergone kyphoplasty at the T10 through L1 levels.  IMPRESSION: 1. New fluffy alveolar densities in both lungs are worrisome for pneumonia. CHF is not problematic Lee excluded. 2. There is no evidence of a pleural effusion. 3. The patient has known paravertebral soft tissue masses consistent with myeloma. These are not clearly evident on today's study.   Electronically Signed   By: David  Martinique   On: 08/25/2013 13:26   Ct Angio Chest Pe W/cm &/or Wo Cm  08/26/2013   CLINICAL DATA:  Unexplained persistent  dyspnea and tachycardia, history of multiple myeloma and previous history of pulmonary embolism.  EXAM: CT ANGIOGRAPHY CHEST WITH CONTRAST  TECHNIQUE: Multidetector CT imaging of the chest was performed using the standard protocol during bolus administration of intravenous contrast. Multiplanar CT image reconstructions including MIPs were obtained to evaluate the vascular anatomy.  CONTRAST:  155m OMNIPAQUE IOHEXOL 350 MG/ML SOLN  COMPARISON:  PA and lateral chest x-ray of February 22, 2014 and CT scan of the chest dated August 20, 2013  FINDINGS: Contrast within the pulmonary arterial tree is normal in appearance. There are no filling defects to suggest an acute pulmonary embolism. Review of the MIP  images confirms the above findings. 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 The caliber of the thoracic aorta is normal. Since the previous study small bilateral pleural effusions have developed. There is no pericardial effusion. The cardiac chambers are normal in size. There is no bulky mediastinal nor hilar lymphadenopathy. There is chronic enlargement of the left thyroid lobe. Again demonstrated is abnormal soft tissue density in the left paravertebral region at approximately T8 and T9.  At lung window settings there has been interval development of fluffy partially confluent bilateral alveolar infiltrates. The upper lobes appear to be the most conspicuously involved.  Known skeletal involvement by the patient's multiple myeloma is again demonstrated.  Within the upper abdomen the observed portions of the liver appear normal. There are tiny radiodensities associated with the dependent portion of the gallbladder near the neck. The spleen is subjectively enlarged.  IMPRESSION: 1. There are new fluffy bilateral widespread pulmonary alveolar infiltrates consistent with pneumonia or pneumonitis. 2. There are new small bilateral pleural effusions layering posteriorly. 3. Stable findings of known skeletal and paravertebral  involvement by the patient's known myeloma are demonstrated. 4. There is no evidence of an acute pulmonary embolism.   Electronically Signed   By: David  Martinique   On: 08/26/2013 17:07    Scheduled Meds: . acyclovir  400 mg Oral Daily  . azithromycin  500 mg Intravenous Q24H  . aztreonam  1 g Intravenous Q8H  . diltiazem  180 mg Oral q morning - 10a  . fentaNYL  25 mcg Transdermal Q72H  . ipratropium-albuterol  3 mL Nebulization Q6H  . nitroGLYCERIN  0.5 inch Topical Q6H  . OxyCODONE  10 mg Oral Q12H  . pantoprazole  20 mg Oral Daily  . predniSONE  10 mg Oral Q breakfast  . sodium chloride  3 mL Intravenous Q12H  . vancomycin  1,500 mg Intravenous Q12H   Continuous Infusions: . sodium chloride 1,000 mL (08/26/13 1300)      Time spent: 35 minutes    Lamoine Magallon  [facility] Pager [phone] If 7PM-7AM, please contact night-coverage at [email or web address], password [facility] 08/27/2013, 8:37 AM  LOS: 2 days

## 2013-08-27 NOTE — Procedures (Signed)
Bronchoscopy Procedure Note Robert Berger [id number] [birth year]  Procedure: Bronchoscopy Indications: Obtain specimens for culture and/or other diagnostic studies  Procedure Details Consent: Risks of procedure as well as the alternatives and risks of each were explained to the (patient/caregiver).  Consent for procedure obtained. Time Out: Verified patient identification, verified procedure, site/side was marked, verified correct patient position, special equipment/implants available, medications/allergies/relevent history reviewed, required imaging and test results available.  Performed  In preparation for procedure, patient was given 100% FiO2. Sedation: Benzodiazepines  Airway entered and the following bronchi were examined: RUL, RML, RLL, LUL and LLL.   Procedures performed: BAL and inspection. Airways appeared friable w/ easy bleeding.  Bronchoscope removed.    Evaluation Hemodynamic Status: BP stable throughout; O2 sats: stable throughout Patient's Current Condition: stable Specimens:  Sent serosanguinous fluid Complications: No apparent complications Patient did tolerate procedure well.   BABCOCK,PETE 08/27/2013  I was present for entire procedure.  Bronchoscope entered through ETT after placing pt on 100% FiO2.  He was given 4 mg versed for procedure.  Airways were friable, and easily bleed with minimal trauma.  Rt upper/middle/lower lobes and Lt upper/lingular/lower lobes visualized.  No other mucosal or endobronchial lesions.  Bronchoscope wedged into Rt upper lobe bronchus >> instilled 30 ml saline with 15 ml of cloudy, white to yellow fluid returned.    No obvious complications.  Bronchoscope removed.  Pt tolerated procedure well.  Will send fluid for gram stain/culture, AFB, fungal culture, PCP, galactomannin, and respiratory viral panel.  Chesley Mires, MD [facility] Pulmonary/Critical Care 08/27/2013, 4:39 PM Pager:  [phone] After 3pm call: [phone]

## 2013-08-27 NOTE — Procedures (Signed)
Intubation Procedure Note Robert Berger [id number] 21-Dec-1940  Procedure: Intubation Indications: Respiratory insufficiency  Procedure Details Consent: Unable to obtain consent because of altered level of consciousness. Time Out: Verified patient identification, verified procedure, site/side was marked, verified correct patient position, special equipment/implants available, medications/allergies/relevent history reviewed, required imaging and test results available.  Performed  Drugs etomidate 20mg , fentanyl 153mcg, versed 1mg  DL x 1 with GS3 blade Grade 1 view 7.5 ET tube passed through cords under direct visualization Placement confirmed with bilateral breath sounds, positive EtCO2 change and smoke in tube   Evaluation Hemodynamic Status: BP stable throughout; O2 sats: transiently fell during during procedure Patient's Current Condition: stable Complications: No apparent complications Patient did tolerate procedure well. Chest X-ray ordered to verify placement.  CXR: pending.   MCQUAID, DOUGLAS 08/27/2013

## 2013-08-27 NOTE — Progress Notes (Addendum)
Pt reports not being able to breathe, Pt the continuous  Pulse ox was 85% on 6L , RT  Placed pt on a 35% non re breather and pt O2sat continue to decline. Notified the on call Kathline Magic NP, who came right away to see the PT. Per Kathline Magic NP, Pt was transfer to the ICU. Pt and family member made aware, report call to [employer],

## 2013-08-28 ENCOUNTER — Inpatient Hospital Stay (HOSPITAL_COMMUNITY): Payer: Medicare Other

## 2013-08-28 DIAGNOSIS — Z86718 Personal history of other venous thrombosis and embolism: Secondary | ICD-10-CM

## 2013-08-28 DIAGNOSIS — D649 Anemia, unspecified: Secondary | ICD-10-CM

## 2013-08-28 DIAGNOSIS — Z86711 Personal history of pulmonary embolism: Secondary | ICD-10-CM

## 2013-08-28 DIAGNOSIS — D696 Thrombocytopenia, unspecified: Secondary | ICD-10-CM

## 2013-08-28 LAB — BLOOD GAS, ARTERIAL
ACID-BASE EXCESS: 0.7 mmol/L (ref 0.0–2.0)
BICARBONATE: 25 meq/L — AB (ref 20.0–24.0)
FIO2: 0.5 %
O2 Saturation: 92 %
PEEP: 8 cmH2O
Patient temperature: 98.6
RATE: 22 resp/min
TCO2: 23.7 mmol/L (ref 0–100)
VT: 460 mL
pCO2 arterial: 41.5 mmHg (ref 35.0–45.0)
pH, Arterial: 7.398 (ref 7.350–7.450)
pO2, Arterial: 65.2 mmHg — ABNORMAL LOW (ref 80.0–100.0)

## 2013-08-28 LAB — BASIC METABOLIC PANEL
BUN: 19 mg/dL (ref 6–23)
CO2: 25 mEq/L (ref 19–32)
Calcium: 7.3 mg/dL — ABNORMAL LOW (ref 8.4–10.5)
Chloride: 108 mEq/L (ref 96–112)
Creatinine, Ser: 0.67 mg/dL (ref 0.50–1.35)
GFR calc non Af Amer: >90 mL/min (ref >90)
GLUCOSE: 87 mg/dL (ref 70–99)
POTASSIUM: 4 meq/L (ref 3.7–5.3)
Sodium: 141 mEq/L (ref 137–147)

## 2013-08-28 LAB — PHOSPHORUS: Phosphorus: 2.7 mg/dL (ref 2.3–4.6)

## 2013-08-28 LAB — CBC
HCT: 22.4 % — ABNORMAL LOW (ref 39.0–52.0)
HEMOGLOBIN: 7.5 g/dL — AB (ref 13.0–17.0)
MCH: 31.4 pg (ref 26.0–34.0)
MCHC: 33.5 g/dL (ref 30.0–36.0)
MCV: 93.7 fL (ref 78.0–100.0)
Platelets: 34 10*3/uL — ABNORMAL LOW (ref 150–400)
RBC: 2.39 MIL/uL — ABNORMAL LOW (ref 4.22–5.81)
RDW: 20 % — ABNORMAL HIGH (ref 11.5–15.5)
WBC: 6 10*3/uL (ref 4.0–10.5)

## 2013-08-28 LAB — VANCOMYCIN, TROUGH: Vancomycin Tr: 21.1 ug/mL — ABNORMAL HIGH (ref 10.0–20.0)

## 2013-08-28 LAB — MAGNESIUM: MAGNESIUM: 2.5 mg/dL (ref 1.5–2.5)

## 2013-08-28 LAB — PREPARE PLATELET PHERESIS: UNIT DIVISION: 0

## 2013-08-28 LAB — GLUCOSE, CAPILLARY
GLUCOSE-CAPILLARY: 97 mg/dL (ref 70–99)
GLUCOSE-CAPILLARY: 124 mg/dL — AB (ref 70–99)
Glucose-Capillary: 97 mg/dL (ref 70–99)

## 2013-08-28 LAB — PNEUMOCYSTIS JIROVECI SMEAR BY DFA: Pneumocystis jiroveci Ag: NEGATIVE

## 2013-08-28 MED ORDER — PRO-STAT SUGAR FREE PO LIQD
60.0000 mL | Freq: Three times a day (TID) | ORAL | Status: DC
  Administered 2013-08-28 (×3): 60 mL
  Filled 2013-08-28 (×9): qty 60

## 2013-08-28 MED ORDER — SODIUM CHLORIDE 0.9 % IV SOLN
750.0000 mL | Freq: Once | INTRAVENOUS | Status: AC
  Administered 2013-08-28: 750 mL via INTRAVENOUS

## 2013-08-28 MED ORDER — ACETAMINOPHEN 160 MG/5ML PO SOLN: 650.0000 mg | Freq: Four times a day (QID) | ORAL | Status: DC | PRN

## 2013-08-28 MED ORDER — VANCOMYCIN HCL 10 G IV SOLR
1250.0000 mg | Freq: Two times a day (BID) | INTRAVENOUS | Status: DC
  Administered 2013-08-28 – 2013-09-01 (×9): 1250 mg via INTRAVENOUS
  Filled 2013-08-28 (×11): qty 1250

## 2013-08-28 MED ORDER — MIDAZOLAM HCL 2 MG/2ML IJ SOLN
1.0000 mg | INTRAMUSCULAR | Status: DC | PRN
  Administered 2013-08-28 (×4): 2 mg via INTRAVENOUS
  Filled 2013-08-28 (×4): qty 2

## 2013-08-28 MED ORDER — VITAL AF 1.2 CAL PO LIQD
1000.0000 mL | ORAL | Status: DC
  Filled 2013-08-28: qty 1000

## 2013-08-28 MED ORDER — INSULIN ASPART 100 UNIT/ML ~~LOC~~ SOLN
2.0000 [IU] | SUBCUTANEOUS | Status: DC
  Administered 2013-08-28 – 2013-08-30 (×9): 2 [IU] via SUBCUTANEOUS
  Administered 2013-08-31: 4 [IU] via SUBCUTANEOUS

## 2013-08-28 MED ORDER — OXEPA PO LIQD
1000.0000 mL | ORAL | Status: DC
  Filled 2013-08-28 (×4): qty 1000

## 2013-08-28 NOTE — Progress Notes (Signed)
ANTIBIOTIC CONSULT NOTE - FOLLOW UP  Pharmacy Consult for Vancomycin, Antibiotic renal dose adjustment Indication: pneumonia  Allergies  Allergen Reactions  . Penicillins Nausea And Vomiting, Swelling and Rash    Estimated Creatinine Clearance: 90.3 ml/min (by C-G formula based on Cr of 0.67).  Recent Labs  08/28/13 1500  VANCOTROUGH 21.1*     Assessment: 15 yoM with recurrent IgG kappa myeloma admitted 1/17 with progressive respiratory failure in setting of bilateral pulmonary infiltrates.  Please see pharmacist note from earlier today for full details.  Day #4 vancomycin 1500 mg IV q12h, aztreonam 1g IV q8h, azithromycin 500 mg IV q24h.  Patient also on acyclovir 400 mg daily for prophylaxis.  Vancomycin trough today slightly above therapeutic range at 21.1  SCr has remained stable since admission  UOP great at 1.1 ml/kg/hr   Goal of Therapy:  Vancomycin trough level 15-20 mcg/ml  Plan:  - decrease vancomycin dose to 1250mg  IV q12h to start 1/20 at 2000 - continue current doses of aztreonam, azithromycin, and acyclovir - vancomycin trough at steady state if indicated - follow-up clinical course, culture results, renal function - follow-up antibiotic de-escalation and length of therapy  Thank you for the consult. Johny Drilling, PharmD, BCPS Pager: [phone] Pharmacy: [phone] 08/28/2013 4:15 PM

## 2013-08-28 NOTE — Progress Notes (Signed)
[facility] PCCM    Name: Robert Berger MRN: [id number] DOB: [birth year]    ADMISSION DATE:  08/25/2013 CONSULTATION DATE:  1/19  REFERRING MD :  Clementeen Graham  PRIMARY SERVICE: [facility]M   CHIEF COMPLAINT:  Respiratory failure   BRIEF PATIENT DESCRIPTION:  73 year old male w/ recurrent IgG kappa myeloma. Last treated w/ Carfilzomib on 1/16 and cytoxan on 1/15. Admitted on 1/17 w/ progressive respiratory failure in setting of bilateral pulmonary infiltrates. PCCM asked to see on 1/19 as respiratory status continued to worsen in spite of broad spec abx.   SIGNIFICANT EVENTS / STUDIES:  Admitted 1/17, started broad spec abx for PNA CT chest 1/18: 1. There are new fluffy bilateral widespread pulmonary alveolar infiltrates consistent with pneumonia or pneumonitis. 2. There are new small bilateral pleural effusions layering posteriorly. 3. Stable findings of known skeletal and paravertebral involvement by the patient's known myeloma are demonstrated. 4. There is no evidence of an acute pulmonary embolism. 1/18 echo > LVEF 60-65%, LAE 1/19: progressive respiratory failure, intubated.  1/19 bronchoscopy > airway inflammation noted, no lesions, BAL   LINES / TUBES: Port   CULTURES: u strep 1/17: neg u legionella 1/17 >>> Blood 1/17 >> Respiratory culture 1/20>>> Resp viral panel 1/19 >>> BAL PCP 1/19>>> negative BAL culture 1/19 >> BAL fungal 1/19>> BAL AFB >>   ANTIBIOTICS: azithro 1/17>>> Zovirax 1/17>>> azactam 1/17>>> vanc 1/17>>  HISTORY OF PRESENT ILLNESS:    SUBJECTIVE: Intubated 1/19, awake on vent, remains hypoxemic; Febrile overnight   VITAL SIGNS: Temp:  [98.2 F (36.8 C)-101.9 F (38.8 C)] 99.3 F (37.4 C) (01/20 0400) Pulse Rate:  [97-152] 97 (01/20 0800) Resp:  [0-34] 17 (01/20 0800) BP: (101-181)/(54-86) 115/57 mmHg (01/20 0800) SpO2:  [85 %-97 %] 94 % (01/20 0800) FiO2 (%):  [40 %-100 %] 40 % (01/20 0828) Weight:  [92 kg (202 lb 13.2 oz)] 92 kg (202 lb 13.2  oz) (01/20 0400) HEMODYNAMICS:   VENTILATOR SETTINGS: Vent Mode:  [-] PRVC FiO2 (%):  [40 %-100 %] 40 % Set Rate:  [14 bmp-24 bmp] 22 bmp Vt Set:  [460 mL-530 mL] 460 mL PEEP:  [5 cmH20-10 cmH20] 8 cmH20 Plateau Pressure:  [15 cmH20-23 cmH20] 21 cmH20 INTAKE / OUTPUT: Intake/Output     01/19 0701 - 01/20 0700 01/20 0701 - 01/21 0700   P.O.     I.V. (mL/kg) 587.5 (6.4) 52.5 (0.6)   Blood 337.5    IV Piggyback 1400    Total Intake(mL/kg) 2325 (25.3) 52.5 (0.6)   Urine (mL/kg/hr) 2375 (1.1)    Emesis/NG output 75 (0)    Total Output 2450     Net -125 +52.5          PHYSICAL EXAMINATION:  Gen: comfortable on vent HEENT: NCAT, PERRL, ETT in place PULM; Crackles bilaterally CV: RRR, no mgr AB: BS+, soft, nontender Ext: warm, no edema Neuro: Awake and alert on vent, maew  LABS:  CBC  Recent Labs Lab 08/26/13 1410 08/27/13 0540 08/28/13 0530  WBC 7.4 7.3 6.0  HGB 7.5* 7.2* 7.5*  HCT 22.9* 21.7* 22.4*  PLT 15* 13* 34*   Coag's  Recent Labs Lab 08/21/13 1254 08/26/13 1410  APTT 28 28  INR 1.01 1.12   BMET  Recent Labs Lab 08/25/13 1450 08/25/13 2140 08/26/13 0258 08/28/13 0530  NA 138  --  136* 141  K 4.8  --  4.2 4.0  CL 105  --  102 108  CO2 24  --  23  25  BUN 30*  --  23 19  CREATININE 0.90 0.83 0.82 0.67  GLUCOSE 109*  --  119* 87   Electrolytes  Recent Labs Lab 08/25/13 1450 08/26/13 0258 08/28/13 0530  CALCIUM 8.0* 7.7* 7.3*  MG  --   --  2.5  PHOS  --   --  2.7   Sepsis Markers  Recent Labs Lab 08/25/13 1349  LATICACIDVEN 2.80*   ABG  Recent Labs Lab 08/27/13 0807 08/27/13 1218 08/28/13 0426  PHART 7.400 7.375 7.398  PCO2ART 42.6 44.8 41.5  PO2ART 81.2 153.0* 65.2*   Liver Enzymes  Recent Labs Lab 08/23/13 1229 08/25/13 1450  AST 51* 33  ALT 34 23  ALKPHOS 176* 123*  BILITOT 0.63 0.3  ALBUMIN 2.5* 2.2*   Cardiac Enzymes  Recent Labs Lab 08/25/13 2140 08/25/13 2145 08/26/13 0233 08/26/13 0840   TROPONINI  --  <0.30 <0.30 <0.30  PROBNP 437.4*  --   --   --    Glucose  Recent Labs Lab 08/27/13 1247  GLUCAP 100*    Imaging   CXR: Progressive bilateral infiltrates on CT chest   ASSESSMENT / PLAN:  PULMONARY A: Acute respiratory failure in setting of progressive bilateral pulmonary infiltrates.  >diff dx include progressive PNA (favor) vs drug related pneumonitis.  P:   ARDS protocol RASS -1 for vent synchrony No SBT/WUA today Consider steroid trial if cultures remain negative or if hypoxemia worsens  CARDIOVASCULAR A:  Sinus Tach P:  Tele Supportive care   RENAL A:   No acute issue  P:   Renal dose meds  Keep even to neg volume status   GASTROINTESTINAL A:   H/o diverticular disease w/ colostomy  P:   SUP PPI  Start tube feeding 1/20  HEMATOLOGIC A:   Recurrent IgG kappa Meyloma H/o DVT/PE x1, completed anticoagulation course prior to this admission Anemia  Thrombocytopenia  WBC dropping > due to chemo treatment last week? P:  Hgb goal > 7 PLT goal > 10 unless bleeding SCDs for DVT p[rohylaxis  INFECTIOUS A:  Pneumonia  P:   F/u u legionella BAL after platelets  Cont broad spec abx  Cycle PCTs   ENDOCRINE A:   Chronic pred P:   Continue 10mg  daily  NEUROLOGIC A:  Acute encephalopathy, drug and hypoxia related  P:   Supportive care  Add prn versed   TODAY'S SUMMARY: He is certainly not worsening today, I favor an infectious cause right now; hold off on steroids, start tube feeds, add versed  I have personally obtained a history, examined the patient, evaluated laboratory and imaging results, formulated the assessment and plan and placed orders. CRITICAL CARE: The patient is critically ill with multiple organ systems failure and requires high complexity decision making for assessment and support, frequent evaluation and titration of therapies, application of advanced monitoring technologies and extensive interpretation of  multiple databases. Critical Care Time devoted to patient care services described in this note is 40 minutes.    Robert Berger PCCM Pager: [phone] Cell: [phone] If no response, call [phone]   08/28/2013, 8:58 AM

## 2013-08-28 NOTE — Progress Notes (Signed)
INITIAL NUTRITION ASSESSMENT  DOCUMENTATION CODES Per approved criteria  -Obesity Unspecified   INTERVENTION: - Initiate TF of Oxepa via OGT start at 85ml/hr and increase by 55ml every 4 hours to goal of 69ml/hr with Prostat 56ml TID which will provide 1680 calories, 135g protein, and 579ml free water and meet 82% estimated calorie needs (25 kcal/kg of ideal body weight) and 100% estimated protein needs   - Initiate adult enteral protocol - Will continue to monitor   NUTRITION DIAGNOSIS: Inadequate oral intake related to inability to eat as evidenced by NPO, mechanical ventilation.    Goal: Enteral nutrition to provide 60-70% of estimated calorie needs (22-25 kcals/kg ideal body weight) and 100% of estimated protein needs, based on ASPEN guidelines for permissive underfeeding in critically ill obese individuals.  Monitor:  Weights, labs, TF tolerance/advancement, vent status  Reason for Assessment: Malnutrition screening tool, consult for TF management  73 y.o. male  Admitting Dx: Acute hypoxic respiratory failure   ASSESSMENT: Pt with history of multiple myeloma undergoing active chemotherapy and radiation under the care of Dr. 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 Was transferred to [facility] for care. Transferred to ICU 1/18 and PCCM asked to see pt 1/18 due to worsening respiratory status. Intubated yesterday, on ARDS protocol. Seen last month by inpatient RD  during admission who noted pt with variable appetite. Pt's weight stable since then.   Patient is currently intubated on ventilator support.  MV: 10.6 L/min Temp (24hrs), Avg:100.1 F (37.8 C), Min:98.8 F (37.1 C), Max:101.9 F (38.8 C)  Propofol: off    Height: Ht Readings from Last 1 Encounters:  08/27/13 $RemoveB'5\' 7"'dhVWKJeP$  (1.702 m)    Weight: Wt Readings from Last 1 Encounters:  08/28/13 202 lb 13.2 oz (92 kg)    Ideal Body Weight: 148 lb  % Ideal Body Weight: 136%  Wt Readings from Last 10 Encounters:  08/28/13 202 lb 13.2 oz (92 kg)  08/13/13 195 lb (88.451 kg)  08/07/13 194 lb 1.6 oz (88.043 kg)  07/30/13 200 lb (90.719 kg)  07/25/13 203 lb (92.08 kg)  07/24/13 202 lb 1.6 oz (91.672 kg)  07/12/13 200 lb 1.6 oz (90.765 kg)  07/06/13 203 lb (92.08 kg)  06/21/13 199 lb (90.266 kg)  05/25/13 198 lb 1.3 oz (89.848 kg)    Usual Body Weight: 200 lb  % Usual Body Weight: 101%  BMI:  Body mass index is 31.76 kg/(m^2). Class I obesity  Estimated Nutritional Needs: Kcal: 2051 Protein: 135g/day Fluid: >2L/day  Skin: Intact  Diet Order:  NPO  EDUCATION NEEDS: -No education needs identified at this time   Intake/Output Summary (Last 24 hours) at 08/28/13 0946 Last data filed at 08/28/13 0800  Gross per 24 hour  Intake   2215 ml  Output   2450 ml  Net   -235 ml    Last BM: PTA  Labs:   Recent Labs Lab 08/25/13 1450 08/25/13 2140 08/26/13 0258 08/28/13 0530  NA 138  --  136* 141  K 4.8  --  4.2 4.0  CL 105  --  102 108  CO2 24  --  23 25  BUN 30*  --  23 19  CREATININE 0.90 0.83 0.82 0.67  CALCIUM 8.0*  --  7.7* 7.3*  MG  --   --   --  2.5  PHOS  --   --   --  2.7  GLUCOSE 109*  --  119* 87    CBG (last 3)   Recent Labs  08/27/13 1247  GLUCAP 100*    Scheduled Meds: . acyclovir  400 mg Oral Daily  . antiseptic oral rinse  15 mL Mouth Rinse QID  . azithromycin  500 mg Intravenous Q24H  . aztreonam  1 g Intravenous Q8H  . chlorhexidine   15 mL Mouth Rinse BID  . fentaNYL  50 mcg Intravenous Once  . pantoprazole (PROTONIX) IV  40 mg Intravenous Daily  . predniSONE  10 mg Per Tube Q breakfast  . sodium chloride  3 mL Intravenous Q12H  . vancomycin  1,500 mg Intravenous Q12H    Continuous Infusions: . sodium chloride 1,000 mL (08/26/13 1300)  . feeding supplement (VITAL AF 1.2 CAL)    . fentaNYL infusion INTRAVENOUS 325 mcg/hr (08/28/13 0421)    Past Medical History  Diagnosis Date  . Arthritis   . GERD (gastroesophageal reflux disease)   . Complication of anesthesia     aspiration during surgery, difficult to wake up  . Multiple myeloma   . Status post radiation therapy 03/23/12 - 04/05/12    T8 - L4/ 20 Gy/ 10 Fractions  . Dysphagia 05/06/12    ED Visit  . DVT (deep venous thrombosis)   . PE (pulmonary embolism)   . Cellulitis   . Abscess     Left arm and groin area    Past Surgical History  Procedure Laterality Date  . Hemorrhoid surgery    . Laparotomy  05/14/2012    Procedure: EXPLORATORY LAPAROTOMY;  Surgeon: Earnstine Regal, MD;  Location: [facility];  Service: General;  Laterality: N/A;  . Colostomy  05/14/2012    Procedure: COLOSTOMY;  Surgeon: Earnstine Regal, MD;  Location: [facility];  Service: General;  Laterality: N/A;  . Back surgery      47 years ago  . Fracture surgery      ankle    Mikey College MS, RD, [place] Pager [phone] After Hours Pager

## 2013-08-28 NOTE — Progress Notes (Signed)
[name] was admitted yesterday. The study was probably a good thing. His CT of the chest really look trough. Hopefully, he will be able to improve. His oxygen level looks pretty good.  He did get blood and platelets. His platelet count is 34,000. His hemoglobin 7.5.  A chest x-ray today is unchanged. He continues on IV anti- biotics.  I did give him a dose of IVIG. I'm sure that he has significant compromise of his immune system.  He was somewhat alert this morning.  His temperature 99.3. Blood pressure 125 or 59. Oxygen saturation is 95%.  His white cell count is 6.  He is not to be much pain.  His lungs sound a better. He had a better breath sounds bilaterally. Air movement sound a good. Cardiac exam was tachycardic regular. Abdomen was soft. 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 Extremities shows no edema bilaterally.  I appreciate all the great help he is getting. We still have a long way to go before we do think that he is improving.  I think all the staff in the ICU for the outstanding care.  Pete E.  Romans 5:3-5

## 2013-08-28 NOTE — Progress Notes (Signed)
[id number]/MOLMBE Rosana Hoes, RN, BSN, CCM, [phone] Chart reviewed for update of needs and condition.  VENT DAY=2

## 2013-08-28 NOTE — Progress Notes (Signed)
ANTIBIOTIC CONSULT NOTE - FOLLOW UP  Pharmacy Consult for Vancomycin, Antibiotic renal dose adjustment Indication: pneumonia  Allergies  Allergen Reactions  . Penicillins Nausea And Vomiting, Swelling and Rash    Patient Measurements: Height: 5\' 7"  (170.2 cm) Weight: 202 lb 13.2 oz (92 kg) IBW/kg (Calculated) : 66.1  Vital Signs: Temp: 99.3 F (37.4 C) (01/20 0400) Temp src: Oral (01/20 0400) BP: 125/59 mmHg (01/20 0600) Pulse Rate: 103 (01/20 0600) Intake/Output from previous day: 01/19 0701 - 01/20 0700 In: 2305 [I.V.:567.5; Blood:337.5; IV Piggyback:1400] Out: 2450 [Urine:2375; Emesis/NG output:75] Intake/Output from this shift:    Labs:  Recent Labs  08/25/13 2140 08/26/13 0258 08/26/13 1410 08/27/13 0540 08/28/13 0530  WBC 5.8 7.0 7.4 7.3 6.0  HGB 7.5* 7.7* 7.5* 7.2* 7.5*  PLT 20* 17* 15* 13* 34*  CREATININE 0.83 0.82  --   --  0.67   Estimated Creatinine Clearance: 90.3 ml/min (by C-G formula based on Cr of 0.67). No results found for this basename: VANCOTROUGH, VANCOPEAK, VANCORANDOM, GENTTROUGH, GENTPEAK, GENTRANDOM, TOBRATROUGH, TOBRAPEAK, TOBRARND, AMIKACINPEAK, AMIKACINTROU, AMIKACIN,  in the last 72 hours   Microbiology: Recent Results (from the past 720 hour(s))  CULTURE, BLOOD (ROUTINE X 2)     Status: None   Collection Time    08/25/13  9:40 PM      Result Value Range Status   Specimen Description BLOOD RIGHT ARM   Final   Special Requests BOTTLES DRAWN AEROBIC AND ANAEROBIC [place]   Final   Culture  Setup Time     Final   Value: 08/26/2013 04:52     Performed at [facility]   Culture     Final   Value:        BLOOD CULTURE RECEIVED NO GROWTH TO DATE CULTURE WILL BE HELD FOR 5 DAYS BEFORE ISSUING A FINAL NEGATIVE REPORT     Performed at [facility]   Report Status PENDING   Incomplete  CULTURE, BLOOD (ROUTINE X 2)     Status: None   Collection Time    08/25/13  9:40 PM      Result Value Range Status   Specimen Description  BLOOD LEFT ARM   Final   Special Requests BOTTLES DRAWN AEROBIC AND ANAEROBIC [place]   Final   Culture  Setup Time     Final   Value: 08/26/2013 04:52     Performed at [facility]   Culture     Final   Value:        BLOOD CULTURE RECEIVED NO GROWTH TO DATE CULTURE WILL BE HELD FOR 5 DAYS BEFORE ISSUING A FINAL NEGATIVE REPORT     Performed at [facility]   Report Status PENDING   Incomplete  CULTURE, RESPIRATORY (NON-EXPECTORATED)     Status: None   Collection Time    08/27/13  4:35 PM      Result Value Range Status   Specimen Description BRONCHIAL ALVEOLAR LAVAGE   Final   Special Requests Normal   Final   Gram Stain     Final   Value: NO WBC SEEN     NO SQUAMOUS EPITHELIAL CELLS SEEN     NO ORGANISMS SEEN     Performed at [facility]   Culture PENDING   Incomplete   Report Status PENDING   Incomplete  PNEUMOCYSTIS JIROVECI SMEAR BY DFA     Status: None   Collection Time    08/27/13  4:35 PM  Result Value Range Status   Specimen Source-PJSRC BRONCHIAL ALVEOLAR LAVAGE   Final   Pneumocystis jiroveci Ag NEGATIVE   Final   Comment: Performed at [place] [facility]     Assessment: 61 yoM with recurrent IgG kappa myeloma admitted 1/17 with progressive respiratory failure in setting of bilateral pulmonary infiltrates.  CT chest 1/18 confirmed fluffy bilateral widespread pulmonary alveolar infiltrates consistent with pneumonia or pneumonitis.  Patient intubated 1/19.  BAL performed 1/19.  Day #4 vancomycin 1500 mg IV q12h, aztreonam 1g IV q8h, azithromycin 500 mg IV q24h.  Patient also on acyclovir 400 mg daily for prophylaxis.  Tmax 101.9  SCr 0.67, CrCl ~90 ml/min (CG), ~84 ml/min/1.46m2 (normalized)  Goal of Therapy:  Vancomycin trough level 15-20 mcg/ml  Plan:  1.  Check vancomycin trough this afternoon at 1500.  Dose will be adjusted based on level. 2.  No dose adjustments to Aztreonam or Azithromycin. 3.  F/u culture results, clinical  course.  Hershal Coria 08/28/2013,8:39 AM

## 2013-08-29 ENCOUNTER — Inpatient Hospital Stay (HOSPITAL_COMMUNITY): Payer: Medicare Other

## 2013-08-29 ENCOUNTER — Other Ambulatory Visit: Payer: BC Managed Care – PPO | Admitting: Lab

## 2013-08-29 ENCOUNTER — Ambulatory Visit: Payer: BC Managed Care – PPO

## 2013-08-29 DIAGNOSIS — D61818 Other pancytopenia: Secondary | ICD-10-CM

## 2013-08-29 LAB — BASIC METABOLIC PANEL
BUN: 24 mg/dL — ABNORMAL HIGH (ref 6–23)
CO2: 23 mEq/L (ref 19–32)
Calcium: 6.4 mg/dL — CL (ref 8.4–10.5)
Chloride: 110 mEq/L (ref 96–112)
Creatinine, Ser: 0.56 mg/dL (ref 0.50–1.35)
Glucose, Bld: 108 mg/dL — ABNORMAL HIGH (ref 70–99)
Potassium: 3.1 mEq/L — ABNORMAL LOW (ref 3.7–5.3)
SODIUM: 139 meq/L (ref 137–147)

## 2013-08-29 LAB — CBC WITH DIFFERENTIAL/PLATELET
BASOS ABS: 0.1 10*3/uL (ref 0.0–0.1)
BASOS PCT: 3 % — AB (ref 0–1)
Basophils Absolute: 0.1 10*3/uL (ref 0.0–0.1)
Basophils Relative: 3 % — ABNORMAL HIGH (ref 0–1)
EOS ABS: 0 10*3/uL (ref 0.0–0.7)
Eosinophils Absolute: 0 10*3/uL (ref 0.0–0.7)
Eosinophils Relative: 1 % (ref 0–5)
Eosinophils Relative: 0 % (ref 0–5)
HCT: 19.9 % — ABNORMAL LOW (ref 39.0–52.0)
HCT: 21.6 % — ABNORMAL LOW (ref 39.0–52.0)
Hemoglobin: 6.5 g/dL — CL (ref 13.0–17.0)
Hemoglobin: 7 g/dL — ABNORMAL LOW (ref 13.0–17.0)
LYMPHS ABS: 2.2 10*3/uL (ref 0.7–4.0)
LYMPHS PCT: 44 % (ref 12–46)
LYMPHS PCT: 54 % — AB (ref 12–46)
Lymphs Abs: 2 10*3/uL (ref 0.7–4.0)
MCH: 30.8 pg (ref 26.0–34.0)
MCH: 30.8 pg (ref 26.0–34.0)
MCHC: 32.7 g/dL (ref 30.0–36.0)
MCHC: 32.4 g/dL (ref 30.0–36.0)
MCV: 94.3 fL (ref 78.0–100.0)
MCV: 95.2 fL (ref 78.0–100.0)
MONO ABS: 0.6 10*3/uL (ref 0.1–1.0)
MONOS PCT: 20 % — AB (ref 3–12)
Monocytes Absolute: 0.8 10*3/uL (ref 0.1–1.0)
Monocytes Relative: 14 % — ABNORMAL HIGH (ref 3–12)
NEUTROS ABS: 1.2 10*3/uL — AB (ref 1.7–7.7)
Neutro Abs: 1.3 10*3/uL — ABNORMAL LOW (ref 1.7–7.7)
Neutrophils Relative %: 32 % — ABNORMAL LOW (ref 43–77)
Neutrophils Relative %: 30 % — ABNORMAL LOW (ref 43–77)
Platelets: 31 10*3/uL — ABNORMAL LOW (ref 150–400)
Platelets: 34 10*3/uL — ABNORMAL LOW (ref 150–400)
RBC: 2.11 MIL/uL — ABNORMAL LOW (ref 4.22–5.81)
RBC: 2.27 MIL/uL — AB (ref 4.22–5.81)
RDW: 20.1 % — ABNORMAL HIGH (ref 11.5–15.5)
RDW: 20.1 % — ABNORMAL HIGH (ref 11.5–15.5)
WBC: 4.2 10*3/uL (ref 4.0–10.5)
WBC: 4.2 10*3/uL (ref 4.0–10.5)

## 2013-08-29 LAB — PREPARE RBC (CROSSMATCH)

## 2013-08-29 LAB — TYPE AND SCREEN
ABO/RH(D): A POS
ANTIBODY SCREEN: NEGATIVE
UNIT DIVISION: 0
Unit division: 0

## 2013-08-29 LAB — BLOOD GAS, ARTERIAL
ACID-BASE EXCESS: 1 mmol/L (ref 0.0–2.0)
Bicarbonate: 25.2 mEq/L — ABNORMAL HIGH (ref 20.0–24.0)
Drawn by: 232811
FIO2: 0.4 %
O2 Saturation: 97.8 %
PEEP: 8 cmH2O
Patient temperature: 98.8
RATE: 22 resp/min
TCO2: 23.8 mmol/L (ref 0–100)
VT: 460 mL
pCO2 arterial: 40.9 mmHg (ref 35.0–45.0)
pH, Arterial: 7.407 (ref 7.350–7.450)
pO2, Arterial: 107 mmHg — ABNORMAL HIGH (ref 80.0–100.0)

## 2013-08-29 LAB — GLUCOSE, CAPILLARY
GLUCOSE-CAPILLARY: 102 mg/dL — AB (ref 70–99)
GLUCOSE-CAPILLARY: 115 mg/dL — AB (ref 70–99)
GLUCOSE-CAPILLARY: 133 mg/dL — AB (ref 70–99)
GLUCOSE-CAPILLARY: 141 mg/dL — AB (ref 70–99)
Glucose-Capillary: 122 mg/dL — ABNORMAL HIGH (ref 70–99)
Glucose-Capillary: 137 mg/dL — ABNORMAL HIGH (ref 70–99)
Glucose-Capillary: 145 mg/dL — ABNORMAL HIGH (ref 70–99)

## 2013-08-29 MED ORDER — FENTANYL CITRATE 0.05 MG/ML IJ SOLN
25.0000 ug | INTRAMUSCULAR | Status: DC | PRN
  Administered 2013-08-29 – 2013-08-30 (×6): 50 ug via INTRAVENOUS
  Filled 2013-08-29 (×6): qty 2

## 2013-08-29 MED ORDER — OXYCODONE-ACETAMINOPHEN 5-325 MG PO TABS
1.0000 | ORAL_TABLET | ORAL | Status: DC | PRN
  Administered 2013-08-29 – 2013-08-30 (×3): 1 via ORAL
  Filled 2013-08-29 (×2): qty 1

## 2013-08-29 MED ORDER — POTASSIUM CHLORIDE 20 MEQ/15ML (10%) PO LIQD
30.0000 meq | ORAL | Status: AC
  Administered 2013-08-29: 30 meq
  Filled 2013-08-29 (×2): qty 22.5

## 2013-08-29 MED ORDER — POTASSIUM CHLORIDE CRYS ER 20 MEQ PO TBCR
40.0000 meq | EXTENDED_RELEASE_TABLET | Freq: Once | ORAL | Status: AC
  Administered 2013-08-29: 40 meq via ORAL
  Filled 2013-08-29: qty 2

## 2013-08-29 MED ORDER — FENTANYL CITRATE 0.05 MG/ML IJ SOLN
12.5000 ug | INTRAMUSCULAR | Status: DC | PRN
  Administered 2013-08-29: 12.5 ug via INTRAVENOUS
  Administered 2013-08-29: 25 ug via INTRAVENOUS
  Filled 2013-08-29 (×2): qty 2

## 2013-08-29 NOTE — Progress Notes (Signed)
CRITICAL VALUE ALERT  Critical value received:  HgB = 6.5  Date of notification:  08/29/13  Time of notification:  05:15  Critical value read back:yes  Nurse who received alert:  Bethann Humble, RN  MD notified (1st page):  Dr Catha Gosselin  Time of first page:  05:15  MD notified (2nd page):  Time of second page:  Responding MD:  Dr. Catha Gosselin  Time MD responded:  05:17

## 2013-08-29 NOTE — Progress Notes (Signed)
[facility] PCCM    Name: Robert Berger MRN: [id number] DOB: 12-09-1940    ADMISSION DATE:  08/25/2013 CONSULTATION DATE:  1/19  REFERRING MD :  Clementeen Graham  PRIMARY SERVICE: [facility]-->PCCM   CHIEF COMPLAINT:  Respiratory failure   BRIEF PATIENT DESCRIPTION:  73 year old male w/ recurrent IgG kappa myeloma. Last treated w/ Carfilzomib on 1/16 and cytoxan on 1/15. Admitted on 1/17 w/ progressive respiratory failure in setting of bilateral pulmonary infiltrates. PCCM asked to see on 1/19 as respiratory status continued to worsen in spite of broad spec abx.   SIGNIFICANT EVENTS / STUDIES:  Admitted 1/17, started broad spec abx for PNA CT chest 1/18: 1. There are new fluffy bilateral widespread pulmonary alveolar infiltrates consistent with pneumonia or pneumonitis. 2. There are new small bilateral pleural effusions layering posteriorly. 3. Stable findings of known skeletal and paravertebral involvement by the patient's known myeloma are demonstrated. 4. There is no evidence of an acute pulmonary embolism. 1/18 echo > LVEF 60-65%, LAE 1/19: progressive respiratory failure, intubated.  1/19 bronchoscopy > airway inflammation noted, no lesions, BAL   LINES / TUBES: [place]   CULTURES: u strep 1/17: neg u legionella 1/17 >>> Blood 1/17 >> Respiratory culture 1/20>>> few c. albicans Resp viral panel 1/19 >>> BAL PCP 1/19>>> negative BAL culture 1/19 >> BAL fungal 1/19>> BAL AFB >>   ANTIBIOTICS: azithro 1/17>>> Zovirax 1/17>>> azactam 1/17>>> vanc 1/17>>   SUBJECTIVE: Looks great on SBP, fever curve improved, pancytopenic   VITAL SIGNS: Temp:  [98.3 F (36.8 C)-100.2 F (37.9 C)] 98.8 F (37.1 C) (01/21 0419) Pulse Rate:  [88-129] 94 (01/21 0800) Resp:  [0-26] 22 (01/21 0800) BP: (93-150)/(34-88) 119/60 mmHg (01/21 0800) SpO2:  [93 %-97 %] 96 % (01/21 0800) FiO2 (%):  [40 %] 40 % (01/21 0941) Weight:  [90.8 kg (200 lb 2.8 oz)] 90.8 kg (200 lb 2.8 oz) (01/21 0445) HEMODYNAMICS:    VENTILATOR SETTINGS: Vent Mode:  [-] CPAP FiO2 (%):  [40 %] 40 % Set Rate:  [22 bmp] 22 bmp Vt Set:  [460 mL] 460 mL PEEP:  [5 cmH20-8 cmH20] 5 cmH20 Pressure Support:  [5 cmH20] 5 cmH20 Plateau Pressure:  [20 cmH20-24 cmH20] 24 cmH20 INTAKE / OUTPUT: Intake/Output     01/20 0701 - 01/21 0700 01/21 0701 - 01/22 0700   I.V. (mL/kg) 1282.5 (14.1) 40 (0.4)   Blood     NG/GT 900 60   IV Piggyback 650    Total Intake(mL/kg) 2832.5 (31.2) 100 (1.1)   Urine (mL/kg/hr) 1500 (0.7) 275 (1.1)   Emesis/NG output     Total Output 1500 275   Net +1332.5 -175          PHYSICAL EXAMINATION:  Gen: comfortable on vent HEENT: NCAT, PERRL, ETT in place PULM; CTA B today CV: RRR, no mgr AB: BS+, soft, nontender Ext: warm, no edema Neuro: Awake and alert on vent, maew  LABS:  CBC  Recent Labs Lab 08/27/13 0540 08/28/13 0530 08/29/13 0446  WBC 7.3 6.0 4.2  HGB 7.2* 7.5* 6.5*  HCT 21.7* 22.4* 19.9*  PLT 13* 34* 31*   Coag's  Recent Labs Lab 08/26/13 1410  APTT 28  INR 1.12   BMET  Recent Labs Lab 08/26/13 0258 08/28/13 0530 08/29/13 0446  NA 136* 141 139  K 4.2 4.0 3.1*  CL 102 108 110  CO2 23 25 23   BUN 23 19 24*  CREATININE 0.82 0.67 0.56  GLUCOSE 119* 87 108*   Electrolytes  Recent Labs Lab 08/26/13 0258 08/28/13 0530 08/29/13 0446  CALCIUM 7.7* 7.3* 6.4*  MG  --  2.5  --   PHOS  --  2.7  --    Sepsis Markers  Recent Labs Lab 08/25/13 1349  LATICACIDVEN 2.80*   ABG  Recent Labs Lab 08/27/13 1218 08/28/13 0426 08/29/13 0420  PHART 7.375 7.398 7.407  PCO2ART 44.8 41.5 40.9  PO2ART 153.0* 65.2* 107.0*   Liver Enzymes  Recent Labs Lab 08/23/13 1229 08/25/13 1450  AST 51* 33  ALT 34 23  ALKPHOS 176* 123*  BILITOT 0.63 0.3  ALBUMIN 2.5* 2.2*   Cardiac Enzymes  Recent Labs Lab 08/25/13 2140 08/25/13 2145 08/26/13 0233 08/26/13 0840  TROPONINI  --  <0.30 <0.30 <0.30  PROBNP 437.4*  --   --   --    Glucose  Recent  Labs Lab 08/28/13 1223 08/28/13 1552 08/28/13 2042 08/28/13 2333 08/29/13 0416 08/29/13 0758  GLUCAP 97 97 124* 122* 115* 137*    Imaging   CXR: bilateral airspace disease unchanged   ASSESSMENT / PLAN:  PULMONARY A: Acute respiratory failure in setting of progressive bilateral pulmonary infiltrates.  >due to HCAP P:   Extubate O2 as needed Incentive spiro Advance diet based on bedside RN swallow eval  CARDIOVASCULAR A:  Sinus Tach> resolved P:  Tele Supportive care   RENAL A:   Hypokalemia P:   Replete K Repeat BMET  GASTROINTESTINAL A:   H/o diverticular disease w/ colostomy  P:   SUP PPI  Advance diet post extubation  HEMATOLOGIC A:   Recurrent IgG kappa Myeloma H/o DVT/PE x1, completed anticoagulation course prior to this admission Pancytopenia> likely due to chemo P:  Hgb goal > 7 Repeat CBC later today PLT goal > 10 unless bleeding SCDs for DVT prohylaxis  INFECTIOUS A:  Pneumonia  P:   F/u u legionella F/U all cultures/viral studies Cont broad spec abx for now as WBC dropping  ENDOCRINE A:   Chronic pred P:   Continue 10mg  daily  NEUROLOGIC A:  Acute encephalopathy, resolved P:   Prn fentanyl post extubation   TODAY'S SUMMARY: Improved, plan extubation today  I have personally obtained a history, examined the patient, evaluated laboratory and imaging results, formulated the assessment and plan and placed orders. CRITICAL CARE: The patient is critically ill with multiple organ systems failure and requires high complexity decision making for assessment and support, frequent evaluation and titration of therapies, application of advanced monitoring technologies and extensive interpretation of multiple databases. Critical Care Time devoted to patient care services described in this note is 40 minutes.    Jillyn Hidden PCCM Pager: [phone] Cell: [phone] If no response, call [phone]   08/29/2013, 9:53  AM

## 2013-08-29 NOTE — Progress Notes (Signed)
eLink Physician-Brief Progress Note Patient Name: Robert Berger DOB: 12-Aug-1940 MRN: [id number]  Date of Service  08/29/2013   HPI/Events of Note  Hgb down to 6.5 from 7.5  eICU Interventions  Plan: Transfuse 1 unit of pRBC Post-transfusion CBC   Intervention Category Intermediate Interventions: Bleeding - evaluation and treatment with blood products  Dexter Signor 08/29/2013, 5:17 AM

## 2013-08-29 NOTE — Progress Notes (Signed)
eLink Physician-Brief Progress Note Patient Name: JENCARLOS NICOLSON DOB: 04-13-1941 MRN: [id number]  Date of Service  08/29/2013   HPI/Events of Note   Pain, requesting dilaudid.  Hypokalemia.  eICU Interventions  Fentanyl increased.  Percocet ordered.  K-dur ordered.      YACOUB,WESAM 08/29/2013, 4:52 PM

## 2013-08-29 NOTE — Progress Notes (Signed)
CRITICAL VALUE ALERT  Critical value received:  Ca=6.4  Date of notification:  08/29/13  Time of notification:  05:35  Critical value read back:yes  Nurse who received alert:  Bethann Humble, RN  MD notified (1st page):  Dr. Catha Gosselin  Time of first page:  05:35  MD notified (2nd page):  Time of second page:  Responding MD:  Dr. Catha Gosselin  Time MD responded:  05:35

## 2013-08-29 NOTE — Procedures (Signed)
Extubation Procedure Note  Patient Details:   Name: Robert Berger DOB: 03-19-1941 MRN: [id number]   Airway Documentation:     Evaluation  O2 sats: stable throughout Complications: No apparent complications Patient did tolerate procedure well. Bilateral Breath Sounds: Clear;Diminished Suctioning: Airway Yes  Johnette Abraham 08/29/2013, 10:10 AM

## 2013-08-29 NOTE — Progress Notes (Signed)
CARE MANAGEMENT NOTE 08/29/2013  Patient:  Robert Berger, Robert Berger   Account Number:  [id number]  Date Initiated:  08/29/2013  Documentation initiated by:  Anahit Klumb  Subjective/Objective Assessment:   patient transferred to icu/sdu on [id number] from med surg floor due to resp failure, intubated [id number] and extubated to 40%02, [id number]/     Action/Plan:   lives at home with his spouse/will follow for hhc,snf or ltac needs   Anticipated DC Date:  09/01/2013   Anticipated DC Plan:  HOME/SELF CARE  In-house referral  NA      DC Planning Services  NA      [facility] Choice  NA   Choice offered to / List presented to:  NA   DME arranged  NA      DME agency  NA     [place] arranged  NA      [place] agency  NA   Status of service:  In process, will continue to follow Medicare Important Message given?  NA - LOS <3 / Initial given by admissions (If response is "NO", the following Medicare IM given date fields will be blank) Date Medicare IM given:   Date Additional Medicare IM given:    Discharge Disposition:    Per UR Regulation:  Reviewed for med. necessity/level of care/duration of stay  If discussed at [place] of Stay Meetings, dates discussed:    Comments:  [id number]/Nikoli Nasser Eldridge Dace, BSN, [place] [phone] Chart Reviewed for discharge and hospital needs. Discharge needs at time of review:  None present will follow for needs. Review of patient progress due on [id number].

## 2013-08-29 NOTE — Progress Notes (Signed)
[facility] ADULT ICU REPLACEMENT PROTOCOL FOR AM LAB REPLACEMENT ONLY  The patient does apply for the [facility] Adult ICU Electrolyte Replacment Protocol based on the criteria listed below:   1. Is GFR >/= 40 ml/min? yes  Patient's GFR today is >90 2. Is urine output >/= 0.5 ml/kg/hr for the last 6 hours? no Patient's UOP is 0.5 ml/kg/hr 3. Is BUN < 60 mg/dL? yes  Patient's BUN today is 24 4. Abnormal electrolyte(s):potassium 5. Ordered repletion with: Potassium per protocol  Robert Berger P 08/29/2013 6:14 AM

## 2013-08-29 NOTE — Progress Notes (Signed)
[facility]  Telephone:([phone]) [place] NOTE  Events since 1/20  Noted.the patient is now in extubated, he feels very tired, that he is able to interact, no acute mental status are noted. He denies any worsening shortness of breath postprocedure, no cardiac complaints. No abdominal pain, nausea vomiting. No other issues at this time. No acute bleeding   MEDICATIONS:  Scheduled Meds: . acyclovir  400 mg Oral Daily  . antiseptic oral rinse  15 mL Mouth Rinse QID  . azithromycin  500 mg Intravenous Q24H  . aztreonam  1 g Intravenous Q8H  . chlorhexidine  15 mL Mouth Rinse BID  . feeding supplement (OXEPA)  1,000 mL Per Tube Q24H  . feeding supplement (PRO-STAT SUGAR FREE 64)  60 mL Per Tube TID  . insulin aspart  2-6 Units Subcutaneous Q4H  . pantoprazole (PROTONIX) IV  40 mg Intravenous Daily  . potassium chloride  30 mEq Per Tube Q4H  . predniSONE  10 mg Per Tube Q breakfast  . sodium chloride  3 mL Intravenous Q12H  . vancomycin  1,250 mg Intravenous Q12H   Continuous Infusions: . sodium chloride 20 mL/hr at 08/28/13 1034   PRN Meds:.acetaminophen (TYLENOL) oral liquid 160 mg/5 mL, albuterol, fentaNYL, ondansetron (ZOFRAN) IV, sodium chloride  ALLERGIES:   Allergies  Allergen Reactions  . Penicillins Nausea And Vomiting, Swelling and Rash     PHYSICAL EXAMINATION:   Filed Vitals:   08/29/13 0800  BP: 119/60  Pulse: 94  Temp: 98.3 F (36.8 C)  Resp: 22   Filed Weights   08/27/13 0400 08/28/13 0400 08/29/13 0445  Weight: 198 lb 3.1 oz (89.9 kg) 202 lb 13.2 oz (92 kg) 200 lb 2.8 oz (90.4 kg)    73 year old  in no acute distress A. and O. X3. Now extubated. "exhausted"  HEENT: Normocephalic, atraumatic, PERRLA. Oral cavity without thrush or lesions. Lungs  Essentially clear bilaterally anterior chest . No wheezing, rhonchi or rales. Cardiac: regular rate and rhythm Abdomen soft nontender ,obese, non tender  bowel sounds x4. No  HSM Extremities no clubbing cyanosis or edema. multiple areas of echymmoses at the venipuncture sites, but no petechial rash.  bruising or petechial rash Neuro: non focal  LABORATORY/RADIOLOGY DATA:   Recent Labs Lab 08/25/13 1450  08/26/13 0258 08/26/13 1410 08/27/13 0540 08/28/13 0530 08/29/13 0446  WBC 6.2  < > 7.0 7.4 7.3 6.0 4.2  HGB 7.7*  < > 7.7* 7.5* 7.2* 7.5* 6.5*  HCT 23.2*  < > 23.4* 22.9* 21.7* 22.4* 19.9*  PLT 19*  < > 17* 15* 13* 34* 31*  MCV 95.5  < > 94.7 95.0 95.2 93.7 94.3  MCH 31.7  < > 31.2 31.1 31.6 31.4 30.8  MCHC 33.2  < > 32.9 32.8 33.2 33.5 32.7  RDW 20.6*  < > 20.3* 20.1* 20.0* 20.0* 20.1*  LYMPHSABS 1.3  --   --   --   --   --  2.0  MONOABS 1.1*  --   --   --   --   --  0.8  EOSABS 0.0  --   --   --   --   --  0.0  BASOSABS 0.0  --   --   --   --   --  0.1  < > = values in this interval not displayed.  [place]    Recent Labs Lab 08/23/13 1229 08/25/13 1450 08/25/13 2140 08/26/13 0258 08/28/13 0530  08/29/13 0446  NA 135* 138  --  136* 141 139  K 3.9 4.8  --  4.2 4.0 3.1*  CL  --  105  --  102 108 110  CO2 22 24  --  $R'23 25 23  'Iw$ GLUCOSE 153* 109*  --  119* 87 108*  BUN 9.8 30*  --  23 19 24*  CREATININE 0.9 0.90 0.83 0.82 0.67 0.56  CALCIUM 8.8 8.0*  --  7.7* 7.3* 6.4*  MG  --   --   --   --  2.5  --   AST 51* 33  --   --   --   --   ALT 34 23  --   --   --   --   ALKPHOS 176* 123*  --   --   --   --   BILITOT 0.63 0.3  --   --   --   --         Component Value Date/Time   BILITOT 0.3 08/25/2013 1450   BILITOT 0.63 08/23/2013 1229   BILITOT 0.70 08/13/2013 1317   BILIDIR 0.9* 03/18/2013 2339   IBILI 0.5 03/18/2013 2339      Recent Labs Lab 08/26/13 1410  INR 1.12      Urinalysis    Component Value Date/Time   COLORURINE AMBER* 08/20/2013 0204   APPEARANCEUR CLOUDY* 08/20/2013 0204   LABSPEC 1.026 08/20/2013 0204   PHURINE 5.5 08/20/2013 0204   GLUCOSEU 100* 08/20/2013 0204   HGBUR NEGATIVE 08/20/2013 0204   BILIRUBINUR  NEGATIVE 08/20/2013 0204   KETONESUR NEGATIVE 08/20/2013 0204   PROTEINUR 100* 08/20/2013 0204   UROBILINOGEN 1.0 08/20/2013 0204   NITRITE NEGATIVE 08/20/2013 0204   LEUKOCYTESUR NEGATIVE 08/20/2013 0204    Liver Function Tests:  Recent Labs Lab 08/23/13 1229 08/25/13 1450  AST 51* 33  ALT 34 23  ALKPHOS 176* 123*  BILITOT 0.63 0.3  PROT 8.9* 7.9  ALBUMIN 2.5* 2.2*    CBG:  Recent Labs Lab 08/28/13 1552 08/28/13 2042 08/28/13 2333 08/29/13 0416 08/29/13 0758  GLUCAP 97 124* 122* 115* 137*   Hgb A1c No results found for this basename: HGBA1C,  in the last 72 hours  Cardiac Enzymes:  Recent Labs Lab 08/25/13 2145 08/26/13 0233 08/26/13 0840  TROPONINI <0.30 <0.30 <0.30     Radiology Studies:  Dg Chest 2 View  08/25/2013   CLINICAL DATA:  Hypoxia, dyspnea, and chest discomfort with history of multiple myeloma and pulmonary embolism. Marland Kitchen  EXAM: CHEST  2 VIEW  COMPARISON:  CT scan of the chest dated August 20, 2013.  FINDINGS: Since the previous study there have developed fluffy alveolar infiltrates bilaterally. The cardiopericardial silhouette does not appear enlarged. There is no pleural effusion. The mediastinum is normal in width. The Port-A-Cath appliance is unchanged in appearance. The patient has undergone kyphoplasty at the T10 through L1 levels.  IMPRESSION: 1. New fluffy alveolar densities in both lungs are worrisome for pneumonia. CHF is not problematic Lee excluded. 2. There is no evidence of a pleural effusion. 3. The patient has known paravertebral soft tissue masses consistent with myeloma. These are not clearly evident on today's study.   Electronically Signed   By: David  Martin[place]   On: 08/25/2013 13:26   Ct Angio Chest Pe W/cm &/or Wo Cm  08/26/2013   CLINICAL DATA:  Unexplained persistent dyspnea and tachycardia, history of multiple myeloma and previous history of pulmonary embolism.  EXAM: CT ANGIOGRAPHY CHEST  WITH CONTRAST  TECHNIQUE: Multidetector CT  imaging of the chest was performed using the standard protocol during bolus administration of intravenous contrast. Multiplanar CT image reconstructions including MIPs were obtained to evaluate the vascular anatomy.  CONTRAST:  162mL OMNIPAQUE IOHEXOL 350 MG/ML SOLN  COMPARISON:  PA and lateral chest x-ray of February 22, 2014 and CT scan of the chest dated August 20, 2013  FINDINGS: Contrast within the pulmonary arterial tree is normal in appearance. There are no filling defects to suggest an acute pulmonary embolism. Review of the MIP images confirms the above findings. 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 The caliber of the thoracic aorta is normal. Since the previous study small bilateral pleural effusions have developed. There is no pericardial effusion. The cardiac chambers are normal in size. There is no bulky mediastinal nor hilar lymphadenopathy. There is chronic enlargement of the left thyroid lobe. Again demonstrated is abnormal soft tissue density in the left paravertebral region at approximately T8 and T9.  At lung window settings there has been interval development of fluffy partially confluent bilateral alveolar infiltrates. The upper lobes appear to be the most conspicuously involved.  Known skeletal involvement by the patient's multiple myeloma is again demonstrated.  Within the upper abdomen the observed portions of the liver appear normal. There are tiny radiodensities associated with the dependent portion of the gallbladder near the neck. The spleen is subjectively enlarged.  IMPRESSION: 1. There are new fluffy bilateral widespread pulmonary alveolar infiltrates consistent with pneumonia or pneumonitis. 2. There are new small bilateral pleural effusions layering posteriorly. 3. Stable findings of known skeletal and paravertebral involvement by the patient's known myeloma are demonstrated. 4. There is no evidence of an acute pulmonary embolism.   Electronically Signed   By: David  [place]   On:  08/26/2013 17:07   Ct Angio Chest Pe W/cm &/or Wo Cm  08/20/2013   CLINICAL DATA:  Shortness of breath and fatigue. History of DVT. Multiple myeloma.  EXAM: CT ANGIOGRAPHY CHEST WITH CONTRAST  TECHNIQUE: Multidetector CT imaging of the chest was performed using the standard protocol during bolus administration of intravenous contrast. Multiplanar CT image reconstructions including MIPs were obtained to evaluate the vascular anatomy.  CONTRAST:  190mL OMNIPAQUE IOHEXOL 350 MG/ML SOLN  COMPARISON:  05/13/2012  FINDINGS: THORACIC INLET/BODY WALL:  Enlargement of the left lobe thyroid gland with coarse calcifications, stable from 2013.  MEDIASTINUM:  Normal heart size. No pericardial effusion. Multi focal coronary artery atherosclerosis. No acute vascular abnormality, including pulmonary embolism or aortic dissection. No adenopathy.  LUNG WINDOWS:  No consolidation.  No effusion.  No suspicious pulmonary nodule.  UPPER ABDOMEN:  No acute findings.  OSSEOUS:  There are scattered lytic lesions that the imaged skeleton, consistent with myeloma. No acute fracture appreciated. The most notable findings in the thoracic spine is extensive osteolysis of the T9, T10, and T11 bodies, encompassing previously placed bone cement. There is increasing paravertebral soft tissue mass, bilaterally T9-10 and on the left at T8. The nodule at the level of T8 measures 18 mm. Although anemic, the hemo below levels are not as low as would be expected for extramedullary hematopoiesis. Additionally, the paraspinal abnormalities are limited to levels with definite myeloma this involvement of the bone. Question new growth into the posterior canal at the level of T10. No acute fracture appreciated. Other notable lytic areas within the manubrium and T3 body.  Review of the MIP images confirms the above findings.  IMPRESSION: 1. Negative for pulmonary embolism or  other acute intrathoracic abnormality. 2. Multiple myeloma. Compared to thoracic  spine MRI 07/27/2013, new/larger paravertebral extension at the level of T8 and T9/10. If there are new thoracic cord symptoms, MRI could re-evaluate the spinal canal at T10.   Electronically Signed   By: Jorje Guild M.D.   On: 08/20/2013 03:41   Ct Forearm Left W Contrast  07/30/2013   CLINICAL DATA:  Forearm abscess.  Cellulitis.  Fasciitis.  EXAM: CT OF THE LEFT FOREARM WITH CONTRAST  TECHNIQUE: Multidetector CT imaging was performed following the standard protocol during bolus administration of intravenous contrast.  CONTRAST:  112mL OMNIPAQUE IOHEXOL 300 MG/ML  SOLN  COMPARISON:  None.  FINDINGS: Study is technically degraded. The arm had to be scanned over the patient's abdomen due to body habitus. There is no gas identified in the soft tissues of the forearm. No discrete fluid collection is identified. Radius, ulna and humerus appear normal. The visualize carpal bones also appear normal.  IMPRESSION: Technically degraded study by positioning and body habitus. No gas within the soft tissues to suggest fasciitis. No abscess.   Electronically Signed   By: Dereck Ligas M.D.   On: 07/30/2013 23:24   Ir Fluoro Guide Cv Line Right  08/21/2013   INDICATION: History of multiple myeloma, in need of intravenous access for the initiation of chemotherapy.  EXAM: IMPLANTED PORT A CATH PLACEMENT WITH ULTRASOUND AND FLUOROSCOPIC GUIDANCE  MEDICATIONS: Vancomycin 1 g IV; IV antibiotic was given in an appropriate time interval prior to skin puncture.  ANESTHESIA/SEDATION: Versed 3 mg IV; Fentanyl 100 mcg IV;  Total Moderate Sedation Time  30  minutes.  CONTRAST:  None  COMPARISON:  None.  FLUOROSCOPY TIME:  48 seconds.  PROCEDURE: The procedure, risks, benefits, and alternatives were explained to the patient. Questions regarding the procedure were encouraged and answered. The patient understands and consents to the procedure.  The right neck and chest were prepped with chlorhexidine in a sterile fashion, and a  sterile drape was applied covering the operative field. Maximum barrier sterile technique with sterile gowns and gloves were used for the procedure. A timeout was performed prior to the initiation of the procedure. Local anesthesia was provided with 1% lidocaine with epinephrine.  After creating a small venotomy incision, a micropuncture kit was utilized to access the internal jugular vein under direct, real-time ultrasound guidance. Ultrasound image documentation was performed. The microwire was kinked to measure appropriate catheter length.  A subcutaneous port pocket was then created along the upper chest wall utilizing a combination of sharp and blunt dissection. The pocket was irrigated with sterile saline. A single lumen ISP power injectable port was chosen for placement. The 8 Fr catheter was tunneled from the port pocket site to the venotomy incision. The port was placed in the pocket. The external catheter was trimmed to appropriate length. At the venotomy, an 8 Fr peel-away sheath was placed over a guidewire under fluoroscopic guidance. The catheter was then placed through the sheath and the sheath was removed. Final catheter positioning was confirmed and documented with a fluoroscopic spot radiograph. The port was accessed with a Huber needle, aspirated and flushed with heparinized saline.  The venotomy site was closed with an interrupted 4-0 Vicryl suture. The port pocket incision was closed with interrupted 2-0 Vicryl suture and the skin was opposed with a running subcuticular 4-0 Vicryl suture. Dermabond and Steri-strips were applied to both incisions. Dressings were placed. The patient tolerated the procedure well without immediate post procedural complication.  COMPLICATIONS: None immediate  FINDINGS: After catheter placement, the tip lies within the superior cavoatrial junction. The catheter aspirates and flushes normally and is ready for immediate use.  IMPRESSION: Successful placement of a right  internal jugular approach power injectable Port-A-Cath. The catheter is ready for immediate use.   Electronically Signed   By: Sandi Mariscal M.D.   On: 08/21/2013 16:05   Ir US Guide Vasc Access Right  08/21/2013   INDICATION: History of multiple myeloma, in need of intravenous access for the initiation of chemotherapy.  EXAM: IMPLANTED PORT A CATH PLACEMENT WITH ULTRASOUND AND FLUOROSCOPIC GUIDANCE  MEDICATIONS: Vancomycin 1 g IV; IV antibiotic was given in an appropriate time interval prior to skin puncture.  ANESTHESIA/SEDATION: Versed 3 mg IV; Fentanyl 100 mcg IV;  Total Moderate Sedation Time  30  minutes.  CONTRAST:  None  COMPARISON:  None.  FLUOROSCOPY TIME:  48 seconds.  PROCEDURE: The procedure, risks, benefits, and alternatives were explained to the patient. Questions regarding the procedure were encouraged and answered. The patient understands and consents to the procedure.  The right neck and chest were prepped with chlorhexidine in a sterile fashion, and a sterile drape was applied covering the operative field. Maximum barrier sterile technique with sterile gowns and gloves were used for the procedure. A timeout was performed prior to the initiation of the procedure. Local anesthesia was provided with 1% lidocaine with epinephrine.  After creating a small venotomy incision, a micropuncture kit was utilized to access the internal jugular vein under direct, real-time ultrasound guidance. Ultrasound image documentation was performed. The microwire was kinked to measure appropriate catheter length.  A subcutaneous port pocket was then created along the upper chest wall utilizing a combination of sharp and blunt dissection. The pocket was irrigated with sterile saline. A single lumen ISP power injectable port was chosen for placement. The 8 Fr catheter was tunneled from the port pocket site to the venotomy incision. The port was placed in the pocket. The external catheter was trimmed to appropriate length.  At the venotomy, an 8 Fr peel-away sheath was placed over a guidewire under fluoroscopic guidance. The catheter was then placed through the sheath and the sheath was removed. Final catheter positioning was confirmed and documented with a fluoroscopic spot radiograph. The port was accessed with a Huber needle, aspirated and flushed with heparinized saline.  The venotomy site was closed with an interrupted 4-0 Vicryl suture. The port pocket incision was closed with interrupted 2-0 Vicryl suture and the skin was opposed with a running subcuticular 4-0 Vicryl suture. Dermabond and Steri-strips were applied to both incisions. Dressings were placed. The patient tolerated the procedure well without immediate post procedural complication.  COMPLICATIONS: None immediate  FINDINGS: After catheter placement, the tip lies within the superior cavoatrial junction. The catheter aspirates and flushes normally and is ready for immediate use.  IMPRESSION: Successful placement of a right internal jugular approach power injectable Port-A-Cath. The catheter is ready for immediate use.   Electronically Signed   By: Sandi Mariscal M.D.   On: 08/21/2013 16:05   Ct Biopsy  07/31/2013   INDICATION: Evaluate for progression of multiple myeloma  EXAM: CT BIOPSY  MEDICATIONS: Fentanyl 100 mcg IV; Versed 2 mg IV  ANESTHESIA/SEDATION: Sedation Time  10 minutes  CONTRAST:  None  PROCEDURE: Informed consent was obtained from the patient following an explanation of the procedure, risks, benefits and alternatives. The patient understands, agrees and consents for the procedure. All questions were addressed. A  time out was performed prior to the initiation of the procedure. The patient was positioned prone and noncontrast localization CT was performed of the pelvis to demonstrate the iliac marrow spaces. The operative site was prepped and draped in the usual sterile fashion.  Under sterile conditions and local anesthesia, an 11 gauge coaxial bone  biopsy needle was advanced into the left iliac marrow space. Needle position was confirmed with CT imaging. Initially, bone marrow aspiration was performed. Next, a bone marrow biopsy was obtained with the 11 gauge outer bone marrow device. Samples were prepared with the cytotechnologist and deemed adequate. The needle was removed intact. Hemostasis was obtained with compression and a dressing was placed. The patient tolerated the procedure well without immediate post procedural complication.  COMPLICATIONS: None immediate.  IMPRESSION: Successful CT guided left iliac bone marrow aspiration and core biopsy.   Electronically Signed   By: Sandi Mariscal M.D.   On: 07/31/2013 10:49   Dg Chest Port 1 View  08/29/2013   CLINICAL DATA:  Respiratory failure.  EXAM: PORTABLE CHEST - 1 VIEW  COMPARISON:  Single view of the chest 08/28/2013 and 08/27/2013.  FINDINGS: Support tubes and lines are unchanged. Extensive bilateral airspace disease persists without marked change. There is no pneumothorax or pleural effusion. Heart size is normal. Multilevel vertebral augmentation noted.  IMPRESSION: No change in extensive bilateral airspace disease.   Electronically Signed   By: Inge Rise M.D.   On: 08/29/2013 07:31   Dg Chest Port 1 View  08/28/2013   CLINICAL DATA:  Intubation.  EXAM: PORTABLE CHEST - 1 VIEW  COMPARISON:  08/27/2013.  FINDINGS: Endotracheal tube tip noted 2.3 cm above the carina. Central line in stable position. NG tube tip below the left hemidiaphragm. Bilateral dense pulmonary alveolar infiltrates are present and unchanged. No pleural effusion or pneumothorax. Heart size is normal. No acute osseous abnormality.  IMPRESSION: 1.  Stable line and tube positions.  2. Bilateral dense unchanged pulmonary alveolar infiltrates. This can't be seen with pulmonary edema, ARDS, and /or bilateral pneumonia .   Electronically Signed   By: Marcello Moores  Register   On: 08/28/2013 07:29   Portable Chest Xray  08/27/2013    CLINICAL DATA:  Acute respiratory failure. On ventilator. Healthcare associated pneumonia. Multiple myeloma.  EXAM: PORTABLE CHEST - 1 VIEW  COMPARISON:  08/25/2013  FINDINGS: Right-sided power port remains in place. New endotracheal tube is seen with tip approximately 2.5 cm above the carina.  Significant interval worsening of diffuse bilateral airspace disease is seen since previous study. Low lung volumes again noted. No evidence of pneumothorax or pleural effusion. Heart size is normal.  IMPRESSION: Endotracheal tube in appropriate position. Significant worsening of diffuse bilateral airspace disease.   Electronically Signed   By: Earle Gell M.D.   On: 08/27/2013 11:58       ASSESSMENT AND PLAN:   32. 73 year old white male with a history of recurrent IgG kappa myeloma. Last treated w/ Carfilzomib on 1/16 and cytoxan on 1/15. Was due for chemo today, which will be placed on hold due to these current hospitalization 2 respiratory failure requiring intubation. Currently extubated, in no acute distress, appreciate P.CCM involvement 3. HCAP, on oxygen,respiratory  therapy, and IV antibiotics with   azithromycin, Vanco, Azactam and antivirals with Zovirax, all since 11/17.Legionella and other cultures in process.  4. Pancytopenia in the setting of recent chemo with immunocompromise compromise , and infection, dilution and polypharmacy. Monitor counts closely. Maintain Hb >7, platelets >  10 unless if bleeding. May need a transfusion, as his hemoglobin is 6.5 today with a hematocrit of 19.9. His platelets are 31,000, from 34,000 on the day prior, but when compared to 08/27/2013, when his platelets were 13k, improvement is seen.his white count today is 4.2, continue to monitor closely.He did receive a dose of IVIG on 08/28/2048 5. History of DVT/PE, on SCDs  6. Acute encephalopathy, resolved. Dr.Ennever to write an addendum to thise note. Thank you very much for allowing [place] the opportunity to participate in  the care of this nice patient. Appreciate all specialties involved.   Deborah Chalk, PA-C 08/29/2013, 10:35 AM  ADDENDUM:  I agree with the above.  He looks better.  Getting tube feeds.  Is actually pretty alert.  CXR does not look any better.  Hgb is 6.7 -- to be transfused with 1 unit of blood..Platelets are stable.  No bleeding.  No obvious pain issues.    I hope that we will start to see some inprovment soon!!  On IV abx.  His exam is pretty non focal.  No real changes from yesterday.  We will continue to follow and help out with any heme issues that arise.  Lum Keas

## 2013-08-30 ENCOUNTER — Inpatient Hospital Stay (HOSPITAL_COMMUNITY): Payer: Medicare Other

## 2013-08-30 ENCOUNTER — Ambulatory Visit: Payer: BC Managed Care – PPO

## 2013-08-30 DIAGNOSIS — M549 Dorsalgia, unspecified: Secondary | ICD-10-CM

## 2013-08-30 LAB — CULTURE, RESPIRATORY
GRAM STAIN: NONE SEEN
SPECIAL REQUESTS: NORMAL

## 2013-08-30 LAB — ASPERGILLUS GALACTOMANNAN ANTIGEN
ASPERGILLUS GALACTOMANNAN INDEX: 0.13 (ref <0.50)
Aspergillus galactomannan Ag: NOT DETECTED

## 2013-08-30 LAB — BASIC METABOLIC PANEL
BUN: 18 mg/dL (ref 6–23)
CALCIUM: 7.5 mg/dL — AB (ref 8.4–10.5)
CO2: 25 meq/L (ref 19–32)
Chloride: 110 mEq/L (ref 96–112)
Creatinine, Ser: 0.56 mg/dL (ref 0.50–1.35)
GFR calc Af Amer: >90 mL/min (ref >90)
GFR calc non Af Amer: >90 mL/min (ref >90)
Glucose, Bld: 99 mg/dL (ref 70–99)
POTASSIUM: 4 meq/L (ref 3.7–5.3)
SODIUM: 140 meq/L (ref 137–147)

## 2013-08-30 LAB — RESPIRATORY VIRUS PANEL
Adenovirus: NOT DETECTED
INFLUENZA A: NOT DETECTED
INFLUENZA B 1: NOT DETECTED
Influenza A H1: NOT DETECTED
Influenza A H3: NOT DETECTED
METAPNEUMOVIRUS: NOT DETECTED
Parainfluenza 1: NOT DETECTED
Parainfluenza 2: NOT DETECTED
Parainfluenza 3: NOT DETECTED
RESPIRATORY SYNCYTIAL VIRUS A: NOT DETECTED
Respiratory Syncytial Virus B: NOT DETECTED
Rhinovirus: NOT DETECTED

## 2013-08-30 LAB — GLUCOSE, CAPILLARY
GLUCOSE-CAPILLARY: 89 mg/dL (ref 70–99)
GLUCOSE-CAPILLARY: 133 mg/dL — AB (ref 70–99)
Glucose-Capillary: 89 mg/dL (ref 70–99)
Glucose-Capillary: 136 mg/dL — ABNORMAL HIGH (ref 70–99)
Glucose-Capillary: 109 mg/dL — ABNORMAL HIGH (ref 70–99)

## 2013-08-30 LAB — CBC
HCT: 24.6 % — ABNORMAL LOW (ref 39.0–52.0)
HEMOGLOBIN: 8.1 g/dL — AB (ref 13.0–17.0)
MCH: 30.8 pg (ref 26.0–34.0)
MCHC: 32.9 g/dL (ref 30.0–36.0)
MCV: 93.5 fL (ref 78.0–100.0)
PLATELETS: 35 10*3/uL — AB (ref 150–400)
RBC: 2.63 MIL/uL — AB (ref 4.22–5.81)
RDW: 19.4 % — ABNORMAL HIGH (ref 11.5–15.5)
WBC: 4.7 10*3/uL (ref 4.0–10.5)

## 2013-08-30 LAB — TYPE AND SCREEN
ABO/RH(D): A POS
Antibody Screen: NEGATIVE
UNIT DIVISION: 0

## 2013-08-30 LAB — CULTURE, RESPIRATORY W GRAM STAIN

## 2013-08-30 MED ORDER — FENTANYL 25 MCG/HR TD PT72
25.0000 ug | MEDICATED_PATCH | TRANSDERMAL | Status: DC
  Administered 2013-08-30: 25 ug via TRANSDERMAL
  Filled 2013-08-30: qty 1

## 2013-08-30 MED ORDER — ENSURE COMPLETE PO LIQD
237.0000 mL | Freq: Three times a day (TID) | ORAL | Status: DC
  Administered 2013-08-30 – 2013-09-03 (×11): 237 mL via ORAL

## 2013-08-30 MED ORDER — OXYCODONE-ACETAMINOPHEN 5-325 MG PO TABS
1.0000 | ORAL_TABLET | Freq: Four times a day (QID) | ORAL | Status: DC | PRN
  Administered 2013-08-30 – 2013-08-31 (×2): 2 via ORAL
  Filled 2013-08-30 (×2): qty 2

## 2013-08-30 MED ORDER — POTASSIUM CHLORIDE CRYS ER 20 MEQ PO TBCR
40.0000 meq | EXTENDED_RELEASE_TABLET | Freq: Once | ORAL | Status: AC
  Administered 2013-08-30: 40 meq via ORAL
  Filled 2013-08-30: qty 2

## 2013-08-30 MED ORDER — HYDROMORPHONE HCL PF 2 MG/ML IJ SOLN
2.0000 mg | INTRAMUSCULAR | Status: DC | PRN
  Administered 2013-08-30 – 2013-09-03 (×33): 2 mg via INTRAVENOUS
  Filled 2013-08-30 (×33): qty 1

## 2013-08-30 MED ORDER — FUROSEMIDE 10 MG/ML IJ SOLN
40.0000 mg | Freq: Four times a day (QID) | INTRAMUSCULAR | Status: AC
  Administered 2013-08-30 (×2): 40 mg via INTRAVENOUS
  Filled 2013-08-30 (×2): qty 4

## 2013-08-30 NOTE — Progress Notes (Signed)
NUTRITION FOLLOW UP  Intervention:   - Ensure Complete TID - Encouraged increased meal intake - Will continue to monitor   Nutrition Dx:   Inadequate oral intake related to inability to eat as evidenced by NPO, mechanical ventilation - ongoing but now related to poor appetite as evidenced by <25% meal intake.    Goal:   Enteral nutrition to provide 60-70% of estimated calorie needs (22-25 kcals/kg ideal body weight) and 100% of estimated protein needs, based on ASPEN guidelines for permissive underfeeding in critically ill obese individuals - no longer appropriate, pt not on TF  New goal: Pt to consume >90% of meals/supplements    Monitor:   Weights, labs, intake  Assessment:   Pt with history of multiple myeloma undergoing active chemotherapy and radiation under the care of Dr. 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 Was transferred to [facility] for care. Transferred to ICU 1/18 and PCCM asked to see pt 1/18 due to worsening respiratory status.   1/20 - Intubated yesterday, on ARDS protocol. Seen last month by inpatient RD during admission who noted pt with variable appetite. Pt's weight stable since then.   1/22 - Pt extubated yesterday and TF d/c. Diet advanced to full liquid yesterday and regular diet today. Met with pt who reports he has just been "mumbling through" his meals, c/o no appetite. Eating a few bites of ice cream during visit. Agreeable to trying Ensure  Complete which was provided. Reports eating better PTA, 2 meals/day.   Height: Ht Readings from Last 1 Encounters:  08/28/13 _0  (1.702 m)    Weight Status:   Wt Readings from Last 1 Encounters:  08/30/13 205 lb 7.5 oz (93.2 kg)  Admit wt:        197 lb (89.3 kg)  Net I/Os: 1.1L  Re-estimated needs:  Kcal: 1700-1900 Protein: 80-100g Fluid: 1.7-1.9L/day  Skin: WNL  Diet Order: General   Intake/Output Summary (Last 24 hours) at 08/30/13 1021 Last data filed at 08/30/13 0900  Gross per 24 hour  Intake 1773.34 ml  Output   1425 ml  Net 348.34 ml    Last BM: 1/21   Labs:   Recent Labs Lab 08/28/13 0530 08/29/13 0446 08/30/13 0535  NA 141 139 140  K 4.0 3.1* 4.0  CL 108 110 110  CO2 _1 BUN 19 24* 18  CREATININE 0.67 0.56 0.56  CALCIUM 7.3* 6.4* 7.5*  MG 2.5  --   --   PHOS 2.7  --   --   GLUCOSE 87 108* 99    CBG (last 3)   Recent Labs  08/29/13 2336 08/30/13 0351 08/30/13 0805  GLUCAP 102* 89 89    Scheduled Meds: . acyclovir  400 mg Oral Daily  . antiseptic oral rinse  15 mL Mouth Rinse QID  . aztreonam  1 g Intravenous Q8H  . chlorhexidine  15 mL Mouth Rinse BID  . feeding supplement (OXEPA)  1,000 mL Per  Tube Q24H  . feeding supplement (PRO-STAT SUGAR FREE 64)  60 mL Per Tube TID  . fentaNYL  25 mcg Transdermal Q72H  . furosemide  40 mg Intravenous Q6H  . insulin aspart  2-6 Units Subcutaneous Q4H  . predniSONE  10 mg Per Tube Q breakfast  . sodium chloride  3 mL Intravenous Q12H  . vancomycin  1,250 mg Intravenous Q12H    Continuous Infusions: . sodium chloride 20 mL/hr at 08/28/13 [place], RD, [place] Pager [phone] After Hours Pager

## 2013-08-30 NOTE — Progress Notes (Signed)
[facility] PCCM    Name: Robert Berger MRN: [id number] DOB: [birth year]    ADMISSION DATE:  08/25/2013 CONSULTATION DATE:  1/19  REFERRING MD :  Clementeen Graham  PRIMARY SERVICE: [facility]-->PCCM   CHIEF COMPLAINT:  Respiratory failure   BRIEF PATIENT DESCRIPTION:  73 year old male w/ recurrent IgG kappa myeloma. Last treated w/ Carfilzomib on 1/16 and cytoxan on 1/15. Admitted on 1/17 w/ progressive respiratory failure in setting of bilateral pulmonary infiltrates. PCCM asked to see on 1/19 as respiratory status continued to worsen in spite of broad spec abx.   SIGNIFICANT EVENTS / STUDIES:  Admitted 1/17, started broad spec abx for PNA CT chest 1/18: 1. There are new fluffy bilateral widespread pulmonary alveolar infiltrates consistent with pneumonia or pneumonitis. 2. There are new small bilateral pleural effusions layering posteriorly. 3. Stable findings of known skeletal and paravertebral involvement by the patient's known myeloma are demonstrated. 4. There is no evidence of an acute pulmonary embolism. 1/18 echo > LVEF 60-65%, LAE 1/19: progressive respiratory failure, intubated.  1/19 bronchoscopy > airway inflammation noted, no lesions, BAL   LINES / TUBES: Port  1/19 ETT > 1/21  CULTURES: u strep 1/17: neg u legionella 1/17 >>> Blood 1/17 >> Respiratory culture 1/20>>> few c. albicans Resp viral panel 1/19 >>>never received BAL PCP 1/19>>> negative BAL culture 1/19 >> BAL fungal 1/19>> BAL AFB >> Respiratory viral panel (nasal swab) 1/22>>>   ANTIBIOTICS: azithro 1/17>>> Zovirax 1/17>>> azactam 1/17>>> vanc 1/17>>   SUBJECTIVE: Back pain, breathing is OK, wants to eat   VITAL SIGNS: Temp:  [97.2 F (36.2 C)-99.5 F (37.5 C)] 97.2 F (36.2 C) (01/22 0016) Pulse Rate:  [81-122] 122 (01/22 0600) Resp:  [13-33] 30 (01/22 0600) BP: (122-166)/(48-95) 166/93 mmHg (01/22 0600) SpO2:  [88 %-100 %] 95 % (01/22 0600) FiO2 (%):  [40 %-50 %] 50 % (01/22 0300) Weight:  [93.2 kg  (205 lb 7.5 oz)] 93.2 kg (205 lb 7.5 oz) (01/22 0500) HEMODYNAMICS:   VENTILATOR SETTINGS: Vent Mode:  [-] CPAP FiO2 (%):  [40 %-50 %] 50 % PEEP:  [5 cmH20] 5 cmH20 Pressure Support:  [5 cmH20] 5 cmH20 INTAKE / OUTPUT: Intake/Output     01/21 0701 - 01/22 0700 01/22 0701 - 01/23 0700   I.V. (mL/kg) 460 (4.9)    Blood 413.3    NG/GT 60    IV Piggyback 900    Total Intake(mL/kg) 1833.3 (19.7)    Urine (mL/kg/hr) 1700 (0.8)    Total Output 1700     Net +133.3          Urine Occurrence 1 x      PHYSICAL EXAMINATION:  Gen: lying in bed, no acute distress HEENT: NCAT, PERRL, PULM; CTA B CV: RRR, no mgr AB: BS+, soft, nontender Ext: warm, no edema Neuro: A&Ox4, Hip/knee extension and plantar flexion 5/5 bilaterally; plantar flexion 4/5 and symmetric  LABS:  CBC  Recent Labs Lab 08/29/13 0446 08/29/13 1730 08/30/13 0535  WBC 4.2 4.2 4.7  HGB 6.5* 7.0* 8.1*  HCT 19.9* 21.6* 24.6*  PLT 31* 34* 35*   Coag's  Recent Labs Lab 08/26/13 1410  APTT 28  INR 1.12   BMET  Recent Labs Lab 08/28/13 0530 08/29/13 0446 08/30/13 0535  NA 141 139 140  K 4.0 3.1* 4.0  CL 108 110 110  CO2 $Re'25 23 25  'rRC$ BUN 19 24* 18  CREATININE 0.67 0.56 0.56  GLUCOSE 87 108* 99   Electrolytes  Recent Labs Lab  08/28/13 0530 08/29/13 0446 08/30/13 0535  CALCIUM 7.3* 6.4* 7.5*  MG 2.5  --   --   PHOS 2.7  --   --    Sepsis Markers  Recent Labs Lab 08/25/13 1349  LATICACIDVEN 2.80*   ABG  Recent Labs Lab 08/27/13 1218 08/28/13 0426 08/29/13 0420  PHART 7.375 7.398 7.407  PCO2ART 44.8 41.5 40.9  PO2ART 153.0* 65.2* 107.0*   Liver Enzymes  Recent Labs Lab 08/23/13 1229 08/25/13 1450  AST 51* 33  ALT 34 23  ALKPHOS 176* 123*  BILITOT 0.63 0.3  ALBUMIN 2.5* 2.2*   Cardiac Enzymes  Recent Labs Lab 08/25/13 2140 08/25/13 2145 08/26/13 0233 08/26/13 0840  TROPONINI  --  <0.30 <0.30 <0.30  PROBNP 437.4*  --   --   --    Glucose  Recent Labs Lab  08/29/13 1222 08/29/13 1658 08/29/13 1953 08/29/13 2336 08/30/13 0351 08/30/13 0805  GLUCAP 133* 141* 145* 102* 89 75    Imaging   CXR: bilateral airspace disease unchanged   ASSESSMENT / PLAN:  PULMONARY A: Acute respiratory failure in setting of progressive bilateral pulmonary infiltrates > extubated 1/22 >due to HCAP P:   O2 as needed Incentive spiro Advance diet based on bedside RN swallow eval Lasix x2 today  CARDIOVASCULAR A:  Sinus Tach> resolved P:  Tele Supportive care   RENAL A:   No acute issues P:   Monitor BMET/UOP KCl today with lasix  GASTROINTESTINAL A:   H/o diverticular disease w/ colostomy  P:   D/C SUP PPI  Advance diet post   HEMATOLOGIC A:   Recurrent IgG kappa Myeloma H/o DVT/PE x1, completed anticoagulation course prior to this admission Pancytopenia> likely due to chemo P:  Hgb goal > 7 PLT goal > 10 unless bleeding SCDs for DVT prohylaxis  INFECTIOUS A: Health Care Associated Pneumonia  P:   Repeat respiratory viral panel nasal swab D/c azithro Would treat with Vanc/Aztreonam for 7 day course  ENDOCRINE A:   Chronic pred P:   Continue $RemoveB'10mg'serpTdiP$  daily  NEUROLOGIC A:  Acute encephalopathy, resolved Back pain acute on chronic, worrisome with multiple myeloma; neurologic exam normal P:   Fentanyl patch today Add percocet IV dilaudid breakthrough pain MRI spine today PT consult/OOB today  TODAY'S SUMMARY: diurese today, MRI spine today, change pain meds, PT consult; call [facility] for transfer to their service    Jillyn Hidden PCCM Pager: [phone] Cell: [phone] If no response, call [phone]   08/30/2013, 8:55 AM

## 2013-08-30 NOTE — Progress Notes (Signed)
Robert Berger is now extubated and tube feeds are off.  Pretty alert.  Now having a lot of back pain.  When stable, he needs an MRI of LS spine.  He was on Duragesic patch and dilaudid.  Need to get back on these.  I think that and overly mattress will help.  I'm sure that this pain is from the myeloma involvement it is back. Again, an MRI will help. I just do not think he is stable at to have this.  His oxygen saturation levels were very good. Blood pressure looks great. Heart rate is not too fast.  His blood pressures from is much better. It is now 166/93. He is afebrile. Heart rate is 104. His lungs sound better. He has decent breath sounds bilaterally. There is still some crackles. Cardiac exam is tachycardia regular. He has no murmurs rubs or bruits. Abdomen is soft. He has the colostomy doesn't tach. There is no fluid wave. He has no palpable hepatospleno megaly. Extremities shows some trace edema in his legs. He has decent range of motion and strength. Skin exam shows some scattered ecchymoses. Neurological exam shows no focal neurological deficits.    His lab work shows a white cell count 4.7. Hemoglobin 8.1. His platelet count is 35,000. His potassium is 4. BUN is 18 and creatinine 0.56.  He is getting better. Hopefully the chest x-ray will show improvement.  We have to be more aggressive with pain control. This will improve his status.  I suspect that he will be in the ICU for another day or so. Then we will probably get him onto the floor.  Pete E.  Rodman Key 7:7

## 2013-08-30 NOTE — Progress Notes (Signed)
Md Ennever ordered a overlay mattress. Due to Patient being in ICU specialty bed the overlay mattress cannot be used at this time.

## 2013-08-30 NOTE — Progress Notes (Signed)
Attempted patient was in too much pain, unable to tolerate exam

## 2013-08-30 NOTE — Progress Notes (Signed)
Patient unable to wean less than 40% on Venturi mask. Patient would Desat rapidly if venturi mask less than 40%. CCM MD Mcquaid aware.

## 2013-08-30 NOTE — Progress Notes (Signed)
PT Cancellation Note  Patient Details Name: Robert Berger MRN: [id number] DOB: November 11, 1940   Cancelled Treatment:    Reason Eval/Treat Not Completed: Medical issues which prohibited therapy (resp. distress,)   Claretha Cooper 08/30/2013, 4:12 PM Tresa Endo PT [phone]

## 2013-08-31 ENCOUNTER — Inpatient Hospital Stay (HOSPITAL_COMMUNITY): Payer: Medicare Other

## 2013-08-31 ENCOUNTER — Encounter: Payer: Self-pay | Admitting: Oncology

## 2013-08-31 ENCOUNTER — Ambulatory Visit: Payer: BC Managed Care – PPO | Admitting: Family

## 2013-08-31 DIAGNOSIS — R5381 Other malaise: Secondary | ICD-10-CM

## 2013-08-31 DIAGNOSIS — R5383 Other fatigue: Secondary | ICD-10-CM

## 2013-08-31 DIAGNOSIS — E119 Type 2 diabetes mellitus without complications: Secondary | ICD-10-CM

## 2013-08-31 LAB — BASIC METABOLIC PANEL
BUN: 17 mg/dL (ref 6–23)
CALCIUM: 7.6 mg/dL — AB (ref 8.4–10.5)
CO2: 28 meq/L (ref 19–32)
Chloride: 105 mEq/L (ref 96–112)
Creatinine, Ser: 0.57 mg/dL (ref 0.50–1.35)
GFR calc Af Amer: >90 mL/min (ref >90)
GFR calc non Af Amer: >90 mL/min (ref >90)
GLUCOSE: 116 mg/dL — AB (ref 70–99)
Potassium: 3.6 mEq/L — ABNORMAL LOW (ref 3.7–5.3)
Sodium: 139 mEq/L (ref 137–147)

## 2013-08-31 LAB — CBC WITH DIFFERENTIAL/PLATELET
Basophils Absolute: 0.1 10*3/uL (ref 0.0–0.1)
Basophils Relative: 3 % — ABNORMAL HIGH (ref 0–1)
EOS PCT: 0 % (ref 0–5)
Eosinophils Absolute: 0 10*3/uL (ref 0.0–0.7)
HCT: 25.5 % — ABNORMAL LOW (ref 39.0–52.0)
Hemoglobin: 8.2 g/dL — ABNORMAL LOW (ref 13.0–17.0)
LYMPHS ABS: 2.7 10*3/uL (ref 0.7–4.0)
Lymphocytes Relative: 60 % — ABNORMAL HIGH (ref 12–46)
MCH: 30.1 pg (ref 26.0–34.0)
MCHC: 32.2 g/dL (ref 30.0–36.0)
MCV: 93.8 fL (ref 78.0–100.0)
MONO ABS: 0.6 10*3/uL (ref 0.1–1.0)
Monocytes Relative: 15 % — ABNORMAL HIGH (ref 3–12)
NEUTROS PCT: 22 % — AB (ref 43–77)
Neutro Abs: 0.9 10*3/uL — ABNORMAL LOW (ref 1.7–7.7)
PLATELETS: 34 10*3/uL — AB (ref 150–400)
RBC: 2.72 MIL/uL — ABNORMAL LOW (ref 4.22–5.81)
RDW: 19.7 % — ABNORMAL HIGH (ref 11.5–15.5)
WBC: 4.3 10*3/uL (ref 4.0–10.5)

## 2013-08-31 LAB — RESPIRATORY VIRUS PANEL
Adenovirus: NOT DETECTED
INFLUENZA A H3: NOT DETECTED
INFLUENZA A: NOT DETECTED
Influenza A H1: NOT DETECTED
Influenza B: NOT DETECTED
METAPNEUMOVIRUS: NOT DETECTED
PARAINFLUENZA 1 A: NOT DETECTED
Parainfluenza 2: NOT DETECTED
Parainfluenza 3: NOT DETECTED
RESPIRATORY SYNCYTIAL VIRUS A: NOT DETECTED
Respiratory Syncytial Virus B: NOT DETECTED
Rhinovirus: NOT DETECTED

## 2013-08-31 LAB — GLUCOSE, CAPILLARY
GLUCOSE-CAPILLARY: 94 mg/dL (ref 70–99)
GLUCOSE-CAPILLARY: 91 mg/dL (ref 70–99)
Glucose-Capillary: 132 mg/dL — ABNORMAL HIGH (ref 70–99)
Glucose-Capillary: 108 mg/dL — ABNORMAL HIGH (ref 70–99)
Glucose-Capillary: 166 mg/dL — ABNORMAL HIGH (ref 70–99)

## 2013-08-31 MED ORDER — FENTANYL 50 MCG/HR TD PT72
50.0000 ug | MEDICATED_PATCH | TRANSDERMAL | Status: DC
  Administered 2013-08-31: 50 ug via TRANSDERMAL
  Filled 2013-08-31: qty 1

## 2013-08-31 MED ORDER — VITAMINS A & D EX OINT
TOPICAL_OINTMENT | CUTANEOUS | Status: AC
  Administered 2013-08-31: 5
  Filled 2013-08-31: qty 5

## 2013-08-31 MED ORDER — FUROSEMIDE 10 MG/ML IJ SOLN
40.0000 mg | Freq: Once | INTRAMUSCULAR | Status: AC
  Administered 2013-08-31: 40 mg via INTRAVENOUS
  Filled 2013-08-31: qty 4

## 2013-08-31 MED ORDER — INSULIN ASPART 100 UNIT/ML ~~LOC~~ SOLN
0.0000 [IU] | Freq: Three times a day (TID) | SUBCUTANEOUS | Status: DC
  Administered 2013-09-01 – 2013-09-03 (×4): 1 [IU] via SUBCUTANEOUS

## 2013-08-31 MED ORDER — POTASSIUM CHLORIDE CRYS ER 20 MEQ PO TBCR
20.0000 meq | EXTENDED_RELEASE_TABLET | ORAL | Status: AC
  Administered 2013-08-31 (×2): 20 meq via ORAL
  Filled 2013-08-31 (×2): qty 1

## 2013-08-31 NOTE — Progress Notes (Signed)
Shob with few steps to chair.  Two staff assist.  Up in chair for dinner.  Patient states "I cannot sit up an hour, maybe thirty minutes".

## 2013-08-31 NOTE — Progress Notes (Signed)
He is making improvement slowly but surely. Hopefully, he can come off the face mask oxygen. His oxygen saturations levels are pretty good.  He still incredibly weak. We cannot get and overlay matches for him right now.  His appetite is slowly coming back.  He still having a lot of pain in the lower back. We tried for an MRI. He cannot tolerate this.  Maybe we can get some plain x-rays and see how this looks.  Is no nausea vomiting. He's had no bleeding. There is some leg swelling. He continues on IV antibiotics. He is on low-dose prednisone.   On his physical exam , his vital signs are stable. His blood pressure is certainly quite good. He is afebrile. His lungs show better breath sounds. There is still some wheezing. Cardiac exam regular rate and rhythm with no murmurs rubs or bruits. Abdomen is soft. Has a colostomy. Bowel sounds are active. There is no guarding or rebound tenderness. He has no palpable hepatomegaly. Extremities shows some trace edema in his legs. He has decent range of motion of his joints. Skin exam shows ecchymoses bilaterally. Neurological exam shows no focal neurological deficits.  His labs show his hemoglobin to be 8.2. Platelet count 34. White cell count 4.3. BUN 17 creatinine 0.57.   His chest x-ray shows partial clearing of the pulmonary infiltrates.  I suspect that he probably will need some tablet physical therapy. We really need to see what is going on with his lower back. Hopefully, he will be able to do an MRI over the weekend. We will see what some plain films show. It's possible that he may need kyphoplasty if there is fractures.  I also suspect that he probably will need some type a skilled facility or rehabilitation facility when it does him time for discharge.  We will continue to hold on the myeloma therapy. He just is in no condition to tolerate this right now.  I do appreciate the great care that he has received in the ICU.  Robert E.  2  Thessalonians 5:16-18

## 2013-08-31 NOTE — Progress Notes (Signed)
[facility] PROGRESS NOTE  Robert Berger MOQ:[id number] DOB: 05/14/1941 DOA: 08/25/2013 PCP: Nance Pear., NP  Interval summary 73 year old male with a history of myeloma, DVT/PE, was admitted on 08/25/2013 with progressive respiratory failure in the setting of bilateral pulmonary infiltrates. The patient was started on azithromycin, vancomycin and aztreonam on 08/25/2013. CT angiogram of the chest on 08/26/2013 showed bilateral fluffy pulmonary alveolar infiltrates and new bilateral small pleural effusions. It was negative for pulmonary embolus. The patient experienced progressive respiratory failure and was intubated on 08/27/2013. PCCM was consulted. Bronchoscopy was performed on 08/27/2013. All  BAL cultures including PCP are nondiagnostic.  Bronchoscopy revealed airway inflammation without any lesions. The patient was treated for HCAP and gradually improved. He was extubated on 08/29/2013. The patient was given furosemide x2 on 08/30/2013   Assessment/Plan: Acute respiratory failure -Due to HCAP -Extubated 08/29/2013 -Continue incentives spirometry -proBNP 437 -1/18 echo > LVEF 60-65%, LAE -Influenza PCR negative -Patient was weaned off of the interim mask after 2 intravenous dose of furosemide-->neg 4L -Repeat CXR today HCAP -Continue vancomycin and aztreonam -all BAL cultures nondiagnostic--only shows Candida sp. -blood cultures neg Myeloma/pancytopenia -Last treated w/ Carfilzomib on 1/16 and cytoxan on 1/15 -Ennever following -History of PE/DVT--complete a course of Coumadin -Received 1 unit PRBC 08/29/2013 Chronic back pain -Patient experienced worsening -No patch added with IV Dilaudid for breakthrough pain -MRI ordered by Dr. Filomena Jungling could not tolerate -increase fentanyl  Family Communication:   wife at beside Disposition Plan:   Home when medically stable   Antibiotics:  Vancomycin 08/25/2013>>>  Aztreonam  08/25/2013>>>    Procedures/Studies: Dg Chest 2 View  08/25/2013   CLINICAL DATA:  Hypoxia, dyspnea, and chest discomfort with history of multiple myeloma and pulmonary embolism. Marland Kitchen  EXAM: CHEST  2 VIEW  COMPARISON:  CT scan of the chest dated August 20, 2013.  FINDINGS: Since the previous study there have developed fluffy alveolar infiltrates bilaterally. The cardiopericardial silhouette does not appear enlarged. There is no pleural effusion. The mediastinum is normal in width. The Port-A-Cath appliance is unchanged in appearance. The patient has undergone kyphoplasty at the T10 through L1 levels.  IMPRESSION: 1. New fluffy alveolar densities in both lungs are worrisome for pneumonia. CHF is not problematic Lee excluded. 2. There is no evidence of a pleural effusion. 3. The patient has known paravertebral soft tissue masses consistent with myeloma. These are not clearly evident on today's study.   Electronically Signed   By: Ronella Plunk  [place]   On: 08/25/2013 13:26   Ct Angio Chest Pe W/cm &/or Wo Cm  08/26/2013   CLINICAL DATA:  Unexplained persistent dyspnea and tachycardia, history of multiple myeloma and previous history of pulmonary embolism.  EXAM: CT ANGIOGRAPHY CHEST WITH CONTRAST  TECHNIQUE: Multidetector CT imaging of the chest was performed using the standard protocol during bolus administration of intravenous contrast. Multiplanar CT image reconstructions including MIPs were obtained to evaluate the vascular anatomy.  CONTRAST:  170mL OMNIPAQUE IOHEXOL 350 MG/ML SOLN  COMPARISON:  PA and lateral chest x-ray of February 22, 2014 and CT scan of the chest dated August 20, 2013  FINDINGS: Contrast within the pulmonary arterial tree is normal in appearance. There are no filling defects to suggest an acute pulmonary embolism. Review of the MIP images confirms the above findings. 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 The caliber of the thoracic aorta is normal. Since the previous study small bilateral pleural  effusions have developed. There is no pericardial effusion. The cardiac chambers are normal  in size. There is no bulky mediastinal nor hilar lymphadenopathy. There is chronic enlargement of the left thyroid lobe. Again demonstrated is abnormal soft tissue density in the left paravertebral region at approximately T8 and T9.  At lung window settings there has been interval development of fluffy partially confluent bilateral alveolar infiltrates. The upper lobes appear to be the most conspicuously involved.  Known skeletal involvement by the patient's multiple myeloma is again demonstrated.  Within the upper abdomen the observed portions of the liver appear normal. There are tiny radiodensities associated with the dependent portion of the gallbladder near the neck. The spleen is subjectively enlarged.  IMPRESSION: 1. There are new fluffy bilateral widespread pulmonary alveolar infiltrates consistent with pneumonia or pneumonitis. 2. There are new small bilateral pleural effusions layering posteriorly. 3. Stable findings of known skeletal and paravertebral involvement by the patient's known myeloma are demonstrated. 4. There is no evidence of an acute pulmonary embolism.   Electronically Signed   By: Jeffory Snelgrove  [place]   On: 08/26/2013 17:07   Ct Angio Chest Pe W/cm &/or Wo Cm  08/20/2013   CLINICAL DATA:  Shortness of breath and fatigue. History of DVT. Multiple myeloma.  EXAM: CT ANGIOGRAPHY CHEST WITH CONTRAST  TECHNIQUE: Multidetector CT imaging of the chest was performed using the standard protocol during bolus administration of intravenous contrast. Multiplanar CT image reconstructions including MIPs were obtained to evaluate the vascular anatomy.  CONTRAST:  OMNIPAQUE IOHEXOL 350 MG/ML SOLN  COMPARISON:  05/13/2012  FINDINGS: THORACIC INLET/BODY WALL:  Enlargement of the left lobe thyroid gland with coarse calcifications, stable from 2013.  MEDIASTINUM:  Normal heart size. No pericardial effusion. Multi focal  coronary artery atherosclerosis. No acute vascular abnormality, including pulmonary embolism or aortic dissection. No adenopathy.  LUNG WINDOWS:  No consolidation.  No effusion.  No suspicious pulmonary nodule.  UPPER ABDOMEN:  No acute findings.  OSSEOUS:  There are scattered lytic lesions that the imaged skeleton, consistent with myeloma. No acute fracture appreciated. The most notable findings in the thoracic spine is extensive osteolysis of the T9, T10, and T11 bodies, encompassing previously placed bone cement. There is increasing paravertebral soft tissue mass, bilaterally T9-10 and on the left at T8. The nodule at the level of T8 measures 18 mm. Although anemic, the hemo below levels are not as low as would be expected for extramedullary hematopoiesis. Additionally, the paraspinal abnormalities are limited to levels with definite myeloma this involvement of the bone. Question new growth into the posterior canal at the level of T10. No acute fracture appreciated. Other notable lytic areas within the manubrium and T3 body.  Review of the MIP images confirms the above findings.  IMPRESSION: 1. Negative for pulmonary embolism or other acute intrathoracic abnormality. 2. Multiple myeloma. Compared to thoracic spine MRI 07/27/2013, new/larger paravertebral extension at the level of T8 and T9/10. If there are new thoracic cord symptoms, MRI could re-evaluate the spinal canal at T10.   Electronically Signed   By: Tiburcio Pea M.D.   On: 08/20/2013 03:41   Ir Fluoro Guide Cv Line Right  08/21/2013   INDICATION: History of multiple myeloma, in need of intravenous access for the initiation of chemotherapy.  EXAM: IMPLANTED PORT A CATH PLACEMENT WITH ULTRASOUND AND FLUOROSCOPIC GUIDANCE  MEDICATIONS: Vancomycin 1 g IV; IV antibiotic was given in an appropriate time interval prior to skin puncture.  ANESTHESIA/SEDATION: Versed 3 mg IV; Fentanyl 100 mcg IV;  Total Moderate Sedation Time  30  minutes.  CONTRAST:  None   COMPARISON:  None.  FLUOROSCOPY TIME:  48 seconds.  PROCEDURE: The procedure, risks, benefits, and alternatives were explained to the patient. Questions regarding the procedure were encouraged and answered. The patient understands and consents to the procedure.  The right neck and chest were prepped with chlorhexidine in a sterile fashion, and a sterile drape was applied covering the operative field. Maximum barrier sterile technique with sterile gowns and gloves were used for the procedure. A timeout was performed prior to the initiation of the procedure. Local anesthesia was provided with 1% lidocaine with epinephrine.  After creating a small venotomy incision, a micropuncture kit was utilized to access the internal jugular vein under direct, real-time ultrasound guidance. Ultrasound image documentation was performed. The microwire was kinked to measure appropriate catheter length.  A subcutaneous port pocket was then created along the upper chest wall utilizing a combination of sharp and blunt dissection. The pocket was irrigated with sterile saline. A single lumen ISP power injectable port was chosen for placement. The 8 Fr catheter was tunneled from the port pocket site to the venotomy incision. The port was placed in the pocket. The external catheter was trimmed to appropriate length. At the venotomy, an 8 Fr peel-away sheath was placed over a guidewire under fluoroscopic guidance. The catheter was then placed through the sheath and the sheath was removed. Final catheter positioning was confirmed and documented with a fluoroscopic spot radiograph. The port was accessed with a Huber needle, aspirated and flushed with heparinized saline.  The venotomy site was closed with an interrupted 4-0 Vicryl suture. The port pocket incision was closed with interrupted 2-0 Vicryl suture and the skin was opposed with a running subcuticular 4-0 Vicryl suture. Dermabond and Steri-strips were applied to both incisions.  Dressings were placed. The patient tolerated the procedure well without immediate post procedural complication.  COMPLICATIONS: None immediate  FINDINGS: After catheter placement, the tip lies within the superior cavoatrial junction. The catheter aspirates and flushes normally and is ready for immediate use.  IMPRESSION: Successful placement of a right internal jugular approach power injectable Port-A-Cath. The catheter is ready for immediate use.   Electronically Signed   By: Sandi Mariscal M.D.   On: 08/21/2013 16:05   Ir US Guide Vasc Access Right  08/21/2013   INDICATION: History of multiple myeloma, in need of intravenous access for the initiation of chemotherapy.  EXAM: IMPLANTED PORT A CATH PLACEMENT WITH ULTRASOUND AND FLUOROSCOPIC GUIDANCE  MEDICATIONS: Vancomycin 1 g IV; IV antibiotic was given in an appropriate time interval prior to skin puncture.  ANESTHESIA/SEDATION: Versed 3 mg IV; Fentanyl 100 mcg IV;  Total Moderate Sedation Time  30  minutes.  CONTRAST:  None  COMPARISON:  None.  FLUOROSCOPY TIME:  48 seconds.  PROCEDURE: The procedure, risks, benefits, and alternatives were explained to the patient. Questions regarding the procedure were encouraged and answered. The patient understands and consents to the procedure.  The right neck and chest were prepped with chlorhexidine in a sterile fashion, and a sterile drape was applied covering the operative field. Maximum barrier sterile technique with sterile gowns and gloves were used for the procedure. A timeout was performed prior to the initiation of the procedure. Local anesthesia was provided with 1% lidocaine with epinephrine.  After creating a small venotomy incision, a micropuncture kit was utilized to access the internal jugular vein under direct, real-time ultrasound guidance. Ultrasound image documentation was performed. The microwire was kinked to measure appropriate catheter length.  A  subcutaneous port pocket was then created along the  upper chest wall utilizing a combination of sharp and blunt dissection. The pocket was irrigated with sterile saline. A single lumen ISP power injectable port was chosen for placement. The 8 Fr catheter was tunneled from the port pocket site to the venotomy incision. The port was placed in the pocket. The external catheter was trimmed to appropriate length. At the venotomy, an 8 Fr peel-away sheath was placed over a guidewire under fluoroscopic guidance. The catheter was then placed through the sheath and the sheath was removed. Final catheter positioning was confirmed and documented with a fluoroscopic spot radiograph. The port was accessed with a Huber needle, aspirated and flushed with heparinized saline.  The venotomy site was closed with an interrupted 4-0 Vicryl suture. The port pocket incision was closed with interrupted 2-0 Vicryl suture and the skin was opposed with a running subcuticular 4-0 Vicryl suture. Dermabond and Steri-strips were applied to both incisions. Dressings were placed. The patient tolerated the procedure well without immediate post procedural complication.  COMPLICATIONS: None immediate  FINDINGS: After catheter placement, the tip lies within the superior cavoatrial junction. The catheter aspirates and flushes normally and is ready for immediate use.  IMPRESSION: Successful placement of a right internal jugular approach power injectable Port-A-Cath. The catheter is ready for immediate use.   Electronically Signed   By: Sandi Mariscal M.D.   On: 08/21/2013 16:05   Dg Chest Port 1 View  08/29/2013   CLINICAL DATA:  Respiratory failure.  EXAM: PORTABLE CHEST - 1 VIEW  COMPARISON:  Single view of the chest 08/28/2013 and 08/27/2013.  FINDINGS: Support tubes and lines are unchanged. Extensive bilateral airspace disease persists without marked change. There is no pneumothorax or pleural effusion. Heart size is normal. Multilevel vertebral augmentation noted.  IMPRESSION: No change in extensive  bilateral airspace disease.   Electronically Signed   By: Inge Rise M.D.   On: 08/29/2013 07:31   Dg Chest Port 1 View  08/28/2013   CLINICAL DATA:  Intubation.  EXAM: PORTABLE CHEST - 1 VIEW  COMPARISON:  08/27/2013.  FINDINGS: Endotracheal tube tip noted 2.3 cm above the carina. Central line in stable position. NG tube tip below the left hemidiaphragm. Bilateral dense pulmonary alveolar infiltrates are present and unchanged. No pleural effusion or pneumothorax. Heart size is normal. No acute osseous abnormality.  IMPRESSION: 1.  Stable line and tube positions.  2. Bilateral dense unchanged pulmonary alveolar infiltrates. This can't be seen with pulmonary edema, ARDS, and /or bilateral pneumonia .   Electronically Signed   By: Marcello Moores  [place]   On: 08/28/2013 07:29   Portable Chest Xray  08/27/2013   CLINICAL DATA:  Acute respiratory failure. On ventilator. Healthcare associated pneumonia. Multiple myeloma.  EXAM: PORTABLE CHEST - 1 VIEW  COMPARISON:  08/25/2013  FINDINGS: Right-sided power port remains in place. New endotracheal tube is seen with tip approximately 2.5 cm above the carina.  Significant interval worsening of diffuse bilateral airspace disease is seen since previous study. Low lung volumes again noted. No evidence of pneumothorax or pleural effusion. Heart size is normal.  IMPRESSION: Endotracheal tube in appropriate position. Significant worsening of diffuse bilateral airspace disease.   Electronically Signed   By: Earle Gell M.D.   On: 08/27/2013 11:58         Subjective: Overall, patient is breathing better. Denies any fevers, chills, chest discomfort, hemoptysis, nausea, vomiting, diarrhea, abdominal pain, dysuria.   Objective: Filed Vitals:  08/31/13 0400 08/31/13 0500 08/31/13 0501 08/31/13 0600  BP: 140/86 156/57  164/73  Pulse: 74 95  119  Temp:   97.4 F (36.3 C)   TempSrc:   Oral   Resp: $Remo'19 19  22  'oMYRJ$ Height:      Weight:   87.8 kg (193 lb 9 oz)   SpO2:  98% 97%  85%    Intake/Output Summary (Last 24 hours) at 08/31/13 0631 Last data filed at 08/31/13 9833  Gross per 24 hour  Intake   1140 ml  Output   5275 ml  Net  -4135 ml   Weight change: -5.4 kg (-11 lb 14.5 oz) Exam:   General:  Pt is alert, follows commands appropriately, not in acute distress  HEENT: No icterus, No thrush,  [place]/AT  Cardiovascular: RRR, S1/S2, no rubs, no gallops  Respiratory: Bilateral crackles, left greater than right. No wheezing. Good air movement.  Abdomen: Soft/+BS, non tender, non distended, no guarding  Extremities: trace LE edema, No lymphangitis, No petechiae, No rashes, no synovitis  Data Reviewed: Basic Metabolic Panel:  Recent Labs Lab 08/26/13 0258 08/28/13 0530 08/29/13 0446 08/30/13 0535 08/31/13 0515  NA 136* 141 139 140 139  K 4.2 4.0 3.1* 4.0 3.6*  CL 102 108 110 110 105  CO2 $Re'23 25 23 25 28  'kSb$ GLUCOSE 119* 87 108* 99 116*  BUN 23 19 24* 18 17  CREATININE 0.82 0.67 0.56 0.56 0.57  CALCIUM 7.7* 7.3* 6.4* 7.5* 7.6*  MG  --  2.5  --   --   --   PHOS  --  2.7  --   --   --    Liver Function Tests:  Recent Labs Lab 08/25/13 1450  AST 33  ALT 23  ALKPHOS 123*  BILITOT 0.3  PROT 7.9  ALBUMIN 2.2*   No results found for this basename: LIPASE, AMYLASE,  in the last 168 hours No results found for this basename: AMMONIA,  in the last 168 hours CBC:  Recent Labs Lab 08/25/13 1450  08/28/13 0530 08/29/13 0446 08/29/13 1730 08/30/13 0535 08/31/13 0515  WBC 6.2  < > 6.0 4.2 4.2 4.7 4.3  NEUTROABS 3.8  --   --  1.3* 1.2*  --  0.9*  HGB 7.7*  < > 7.5* 6.5* 7.0* 8.1* 8.2*  HCT 23.2*  < > 22.4* 19.9* 21.6* 24.6* 25.5*  MCV 95.5  < > 93.7 94.3 95.2 93.5 93.8  PLT 19*  < > 34* 31* 34* 35* 34*  < > = values in this interval not displayed. Cardiac Enzymes:  Recent Labs Lab 08/25/13 2145 08/26/13 0233 08/26/13 0840  TROPONINI <0.30 <0.30 <0.30   BNP: No components found with this basename: POCBNP,  CBG:  Recent  Labs Lab 08/30/13 0351 08/30/13 0805 08/30/13 1218 08/30/13 1456 08/30/13 1941  GLUCAP 89 89 136* 109* 133*    Recent Results (from the past 240 hour(s))  CULTURE, BLOOD (ROUTINE X 2)     Status: None   Collection Time    08/25/13  9:40 PM      Result Value Range Status   Specimen Description BLOOD RIGHT ARM   Final   Special Requests BOTTLES DRAWN AEROBIC AND ANAEROBIC 8CC   Final   Culture  Setup Time     Final   Value: 08/26/2013 04:52     Performed at [facility]   Culture     Final   Value:  BLOOD CULTURE RECEIVED NO GROWTH TO DATE CULTURE WILL BE HELD FOR 5 DAYS BEFORE ISSUING A FINAL NEGATIVE REPORT     Performed at [facility]   Report Status PENDING   Incomplete  CULTURE, BLOOD (ROUTINE X 2)     Status: None   Collection Time    08/25/13  9:40 PM      Result Value Range Status   Specimen Description BLOOD LEFT ARM   Final   Special Requests BOTTLES DRAWN AEROBIC AND ANAEROBIC 8CC   Final   Culture  Setup Time     Final   Value: 08/26/2013 04:52     Performed at [facility]   Culture     Final   Value:        BLOOD CULTURE RECEIVED NO GROWTH TO DATE CULTURE WILL BE HELD FOR 5 DAYS BEFORE ISSUING A FINAL NEGATIVE REPORT     Performed at [facility]   Report Status PENDING   Incomplete  CULTURE, RESPIRATORY (NON-EXPECTORATED)     Status: None   Collection Time    08/27/13  4:35 PM      Result Value Range Status   Specimen Description BRONCHIAL ALVEOLAR LAVAGE   Final   Special Requests Normal   Final   Gram Stain     Final   Value: NO WBC SEEN     NO SQUAMOUS EPITHELIAL CELLS SEEN     NO ORGANISMS SEEN     Performed at [facility]   Culture     Final   Value: RARE YEAST CONSISTENT WITH CANDIDA SPECIES     Performed at [facility]   Report Status 08/30/2013 FINAL   Final  PNEUMOCYSTIS JIROVECI SMEAR BY DFA     Status: None   Collection Time    08/27/13  4:35 PM      Result Value Range Status    Specimen Source-PJSRC BRONCHIAL ALVEOLAR LAVAGE   Final   Pneumocystis jiroveci Ag NEGATIVE   Final   Comment: Performed at [facility]  RESPIRATORY VIRUS PANEL     Status: None   Collection Time    08/27/13  4:50 PM      Result Value Range Status   Source - RVPAN NASOPHARYNGEAL   Final   Respiratory Syncytial Virus A NOT DETECTED   Final   Respiratory Syncytial Virus B NOT DETECTED   Final   Influenza A NOT DETECTED   Final   Influenza B NOT DETECTED   Final   Parainfluenza 1 NOT DETECTED   Final   Parainfluenza 2 NOT DETECTED   Final   Parainfluenza 3 NOT DETECTED   Final   Metapneumovirus NOT DETECTED   Final   Rhinovirus NOT DETECTED   Final   Adenovirus NOT DETECTED   Final   Influenza A H1 NOT DETECTED   Final   Influenza A H3 NOT DETECTED   Final   Comment: (NOTE)           Normal Reference Range for each Analyte: NOT DETECTED     Testing performed using the Luminex xTAG Respiratory Viral Panel test     kit.     This test was developed and its performance characteristics determined     by [facility]. It has not been cleared or approved by the [place]     Food and Drug Administration. This test is used for clinical purposes.     It  should not be regarded as investigational or for research. This     laboratory is certified under the [place] (CLIA) as qualified to perform high complexity     clinical laboratory testing.     Performed at [facility], RESPIRATORY (NON-EXPECTORATED)     Status: None   Collection Time    08/28/13  8:19 AM      Result Value Range Status   Specimen Description TRACHEAL ASPIRATE   Final   Special Requests NONE   Final   Gram Stain     Final   Value: ABUNDANT WBC PRESENT, PREDOMINANTLY PMN     NO SQUAMOUS EPITHELIAL CELLS SEEN     RARE GRAM POSITIVE COCCI     IN PAIRS RARE YEAST     Performed at [facility]   Culture     Final   Value: FEW CANDIDA  ALBICANS     Performed at [facility]   Report Status 08/30/2013 FINAL   Final  AFB CULTURE WITH SMEAR     Status: None   Collection Time    08/28/13  8:20 AM      Result Value Range Status   Specimen Description TRACHEAL ASPIRATE   Final   Special Requests NONE   Final   ACID FAST SMEAR     Final   Value: NO ACID FAST BACILLI SEEN     Performed at [facility]   Culture     Final   Value: CULTURE WILL BE EXAMINED FOR 6 WEEKS BEFORE ISSUING A FINAL REPORT     Performed at [facility]   Report Status PENDING   Incomplete  FUNGUS CULTURE W SMEAR     Status: None   Collection Time    08/28/13  8:21 AM      Result Value Range Status   Specimen Description TRACHEAL ASPIRATE   Final   Special Requests NONE   Final   Fungal Smear     Final   Value: RARE YEAST WITH PSEUDOHYPHAE     Performed at [facility]   Culture     Final   Value: CANDIDA ALBICANS     Performed at [facility]   Report Status PENDING   Incomplete     Scheduled Meds: . acyclovir  400 mg Oral Daily  . antiseptic oral rinse  15 mL Mouth Rinse QID  . aztreonam  1 g Intravenous Q8H  . chlorhexidine  15 mL Mouth Rinse BID  . feeding supplement (ENSURE COMPLETE)  237 mL Oral TID WC  . fentaNYL  25 mcg Transdermal Q72H  . insulin aspart  2-6 Units Subcutaneous Q4H  . potassium chloride  20 mEq Oral Q4H  . predniSONE  10 mg Per Tube Q breakfast  . sodium chloride  3 mL Intravenous Q12H  . vancomycin  1,250 mg Intravenous Q12H   Continuous Infusions: . sodium chloride 20 mL/hr at 08/30/13 2022     Panagiota Perfetti, DO  [facility] Pager [phone]  If 7PM-7AM, please contact night-coverage [email or web address] Password [facility] 08/31/2013, 6:31 AM   LOS: 6 days

## 2013-08-31 NOTE — Progress Notes (Signed)
Patient has required dilaudid 2 mg IV every 2 to 2.5 hours today.  Pain is in back rated from a 5 to 10.

## 2013-08-31 NOTE — Progress Notes (Signed)
[facility] ADULT ICU REPLACEMENT PROTOCOL FOR AM LAB REPLACEMENT ONLY  The patient does apply for the [facility] Adult ICU Electrolyte Replacment Protocol based on the criteria listed below:   1. Is GFR >/= 40 ml/min? yes  Patient's GFR today is >90 2. Is urine output >/= 0.5 ml/kg/hr for the last 6 hours? yes Patient's UOP is 0.8 ml/kg/hr 3. Is BUN < 60 mg/dL? yes  Patient's BUN today is 17 4. Abnormal electrolyte(s): Potassium 5. Ordered repletion with: Potassium per Protocol  Clemente Dewey P 08/31/2013 6:18 AM

## 2013-08-31 NOTE — Progress Notes (Signed)
Pt was lying in bed when I arrived. Pt's mother was bedside. Pt said he was doing ok - although he'd rather be somewhere else. Pt and mother were pleasant and appreciated the visit. Ernest Haber Chaplain  08/31/13 1300  Clinical Encounter Type  Visited With Patient and family together

## 2013-08-31 NOTE — Progress Notes (Signed)
ANTIBIOTIC CONSULT NOTE - FOLLOW UP  Pharmacy Consult for Vancomycin, Antibiotic renal dose adjustment Indication: pneumonia  Allergies  Allergen Reactions  . Penicillins Nausea And Vomiting, Swelling and Rash    Patient Measurements: Height: 5\' 7"  (170.2 cm) Weight: 193 lb 9 oz (87.8 kg) IBW/kg (Calculated) : 66.1  Vital Signs: Temp: 97.6 F (36.4 C) (01/23 0800) Temp src: Oral (01/23 0800) BP: 149/72 mmHg (01/23 0900) Pulse Rate: 88 (01/23 0900) Intake/Output from previous day: 01/22 0701 - 01/23 0700 In: 1140 [I.V.:490; IV Piggyback:650] Out: 5275 [Urine:5275] Intake/Output from this shift: Total I/O In: 290 [I.V.:40; IV Piggyback:250] Out: -   Labs:  Recent Labs  08/29/13 0446 08/29/13 1730 08/30/13 0535 08/31/13 0515  WBC 4.2 4.2 4.7 4.3  HGB 6.5* 7.0* 8.1* 8.2*  PLT 31* 34* 35* 34*  CREATININE 0.56  --  0.56 0.57   Estimated Creatinine Clearance: 88.3 ml/min (by C-G formula based on Cr of 0.57).  Recent Labs  08/28/13 1500  VANCOTROUGH 21.1*      Assessment: 67 yoM with recurrent IgG kappa myeloma admitted 1/17 with progressive respiratory failure in setting of bilateral pulmonary infiltrates.  Last treated with cyclophosphamide and carfilzomib 1/15. CT chest 1/18 confirmed fluffy bilateral widespread pulmonary alveolar infiltrates consistent with pneumonia or pneumonitis.  Patient intubated 1/19, extubated 1/21.    Day #7 vancomycin 1500 mg IV q12h and aztreonam 1g IV q8h.  Patient also on acyclovir 400 mg daily for prophylaxis.  Noted CCM planned for 7 day course of antibiotics  Antiinfectives 1/17 >> vancomycin >> 1/17 >> aztreonam >>  1/17 >> Zithromax >> 1/22  Tmax: AF now WBCs: 4.3, ANC down to 0.9 Renal: SCr stable 0.57, CrCl 85 CG 84 N, UOP great  Microbiology 1/17 blood: ngtd 1/17 sputum: no result 1/17 legionella, s.pneumo ag: both negative 1/17 influenza pcr: negative 1/19 pneumocystitis smear: negative 1/19 respiratory virus  panel: none detected 1/19 s.pneumo, legionella ur ag: both negative 1/19 BAL cx: rare yeast consistent with candida sp, pending 1/19 Aspergillus galactomannan smear: not detected 1/20 trach aspirate cx: few candida albicans 1/19 pneumocystis smear (from BAL): negative 1/20 AFB: ngtd    Goal of Therapy:  Vancomycin trough level 15-20 mcg/ml  Plan:  - continue vancomycin at 1250mg  IV q12h   - continue current doses of aztreonam, azithromycin, and acyclovir  - vancomycin trough at steady state if indicated  - follow-up clinical course, culture results, renal function  - follow-up antibiotic de-escalation and length of therapy    Thank you for the consult.  Johny Drilling, PharmD, BCPS  Pager: [phone]  Pharmacy: [phone]  08/28/2013 4:15 PM

## 2013-08-31 NOTE — Progress Notes (Signed)
Clinical Social Work Department BRIEF PSYCHOSOCIAL ASSESSMENT 08/31/2013  Patient:  Robert Berger, Robert Berger     Account Number:  [id number]     Admit date:  08/25/2013  Clinical Social Worker:  Ulyess Blossom  Date/Time:  08/31/2013 11:00 AM  Referred by:  Physician  Date Referred:  08/31/2013 Referred for  Advanced Directives   Other Referral:   Interview type:  Patient Other interview type:   and patient wife at bedside    PSYCHOSOCIAL DATA Living Status:  WIFE Admitted from facility:   Level of care:   Primary support name:  Nunzio Cory Hotelling/[phone] Primary support relationship to patient:  SPOUSE Degree of support available:   strong    CURRENT CONCERNS Current Concerns  Post-Acute Placement   Other Concerns:    SOCIAL WORK ASSESSMENT / PLAN CSW received referral for Advanced Directives.    CSW met with pt and pt spouse at bedside.    CSW introduced self and explained role.    CSW provided pt with Garment/textile technologist.    CSW discussed contents of packet and encouraged pt to review packet and complete parts A and B at his convenience.    CSW provided contact information for CSW, Winfred Leeds for pt to contact when ready for notary to [employer]    CSW clarified pt and pt spouse questions.    CSW to continue to follow to assist with Advanced Directives.   Assessment/plan status:  Psychosocial Support/Ongoing Assessment of Needs Other assessment/ plan:   Information/referral to community resources:   Ecologist    PATIENT'S/FAMILY'S RESPONSE TO PLAN OF CARE: Pt alert and oriented x 4. Pt and pt spouse appreciative of CSW visit and providing Advanced Directives. Pt plans to review packet and complete packet. Pt and pt spouse understand to contact CSW when ready for document to be notarized.    Alison Murray, MSW, LCSW Clinical Social Work Coverage for [employer], [place]

## 2013-09-01 ENCOUNTER — Inpatient Hospital Stay (HOSPITAL_COMMUNITY): Payer: Medicare Other

## 2013-09-01 LAB — GLUCOSE, CAPILLARY
GLUCOSE-CAPILLARY: 91 mg/dL (ref 70–99)
GLUCOSE-CAPILLARY: 129 mg/dL — AB (ref 70–99)
GLUCOSE-CAPILLARY: 91 mg/dL (ref 70–99)
GLUCOSE-CAPILLARY: 107 mg/dL — AB (ref 70–99)
Glucose-Capillary: 88 mg/dL (ref 70–99)
Glucose-Capillary: 84 mg/dL (ref 70–99)

## 2013-09-01 LAB — CULTURE, BLOOD (ROUTINE X 2)
Culture: NO GROWTH
Culture: NO GROWTH

## 2013-09-01 LAB — CBC
HCT: 25.1 % — ABNORMAL LOW (ref 39.0–52.0)
Hemoglobin: 8.4 g/dL — ABNORMAL LOW (ref 13.0–17.0)
MCH: 30.9 pg (ref 26.0–34.0)
MCHC: 33.5 g/dL (ref 30.0–36.0)
MCV: 92.3 fL (ref 78.0–100.0)
Platelets: DECREASED 10*3/uL (ref 150–400)
RBC: 2.72 MIL/uL — ABNORMAL LOW (ref 4.22–5.81)
RDW: 19.3 % — ABNORMAL HIGH (ref 11.5–15.5)
WBC: 4.3 10*3/uL (ref 4.0–10.5)

## 2013-09-01 LAB — BASIC METABOLIC PANEL
BUN: 14 mg/dL (ref 6–23)
CALCIUM: 7.8 mg/dL — AB (ref 8.4–10.5)
CO2: 28 meq/L (ref 19–32)
CREATININE: 0.5 mg/dL (ref 0.50–1.35)
Chloride: 102 mEq/L (ref 96–112)
GFR calc Af Amer: >90 mL/min (ref >90)
GFR calc non Af Amer: >90 mL/min (ref >90)
Glucose, Bld: 89 mg/dL (ref 70–99)
Potassium: 4.2 mEq/L (ref 3.7–5.3)
SODIUM: 136 meq/L — AB (ref 137–147)

## 2013-09-01 LAB — PREALBUMIN: Prealbumin: 13 mg/dL — ABNORMAL LOW (ref 17.0–34.0)

## 2013-09-01 MED ORDER — METOPROLOL TARTRATE 12.5 MG HALF TABLET
12.5000 mg | ORAL_TABLET | Freq: Two times a day (BID) | ORAL | Status: DC
  Administered 2013-09-01 – 2013-09-03 (×5): 12.5 mg via ORAL
  Filled 2013-09-01 (×6): qty 1

## 2013-09-01 MED ORDER — SALINE SPRAY 0.65 % NA SOLN
1.0000 | NASAL | Status: DC | PRN
  Administered 2013-09-01 (×2): 1 via NASAL
  Filled 2013-09-01: qty 44

## 2013-09-01 MED ORDER — FUROSEMIDE 10 MG/ML IJ SOLN
40.0000 mg | Freq: Once | INTRAMUSCULAR | Status: AC
  Administered 2013-09-01: 40 mg via INTRAVENOUS
  Filled 2013-09-01: qty 4

## 2013-09-01 NOTE — Progress Notes (Signed)
[facility] PROGRESS NOTE  Robert Berger WJX:[id number] DOB: Oct 15, 1940 DOA: 08/25/2013 PCP: Nance Pear., NP  Assessment/Plan: Acute respiratory failure  -Due to HCAP  -Extubated 08/29/2013  -Continue incentives spirometry  -proBNP 437  -1/18 echo > LVEF 60-65%, LAE  -Influenza PCR negative  -Patient was weaned off of the interim mask after 2 intravenous dose of furosemide-->neg 4.4L  -Repeat CXR 08/31/13--some improvement in b/l infiltrates -suspect degree of fluid overload as pt is clinically improving with neg fluid balance--repeat lasix IV -08/26/13 CTA chest negative for PE HCAP  -Continue vancomycin and aztreonam--continue abx through 09/01/13 -all BAL cultures nondiagnostic--only shows Candida sp.  -blood cultures neg  Myeloma/pancytopenia  -Last treated w/ Carfilzomib on 1/16 and cytoxan on 1/15  -Ennever following  -History of PE/DVT--complete a course of Coumadin  -Received 1 unit PRBC 08/29/2013  Chronic back pain  -Patient experienced worsening-->improving slowly with fentanyl 30mg and dilaudid 237mevery 2 hours  -MRI ordered by Dr. EnFilomena Junglingould not tolerate  -increased fentanyl 08/31/13 -add TLSO brace -PT eval Family Communication: wife at beside  Disposition Plan: Home when medically stable  Antibiotics:  Vancomycin 08/25/2013>>>  Aztreonam 08/25/2013>>>         Procedures/Studies: Dg Chest 2 View  08/25/2013   CLINICAL DATA:  Hypoxia, dyspnea, and chest discomfort with history of multiple myeloma and pulmonary embolism. . Marland KitchenEXAM: CHEST  2 VIEW  COMPARISON:  CT scan of the chest dated August 20, 2013.  FINDINGS: Since the previous study there have developed fluffy alveolar infiltrates bilaterally. The cardiopericardial silhouette does not appear enlarged. There is no pleural effusion. The mediastinum is normal in width. The Port-A-Cath appliance is unchanged in appearance. The patient has undergone kyphoplasty at the T10 through L1  levels.  IMPRESSION: 1. New fluffy alveolar densities in both lungs are worrisome for pneumonia. CHF is not problematic Lee excluded. 2. There is no evidence of a pleural effusion. 3. The patient has known paravertebral soft tissue masses consistent with myeloma. These are not clearly evident on today's study.   Electronically Signed   By: Chaseton Yepiz  [place] On: 08/25/2013 13:26   Ct Angio Chest Pe W/cm &/or Wo Cm  08/26/2013   CLINICAL DATA:  Unexplained persistent dyspnea and tachycardia, history of multiple myeloma and previous history of pulmonary embolism.  EXAM: CT ANGIOGRAPHY CHEST WITH CONTRAST  TECHNIQUE: Multidetector CT imaging of the chest was performed using the standard protocol during bolus administration of intravenous contrast. Multiplanar CT image reconstructions including MIPs were obtained to evaluate the vascular anatomy.  CONTRAST:  10071mMNIPAQUE IOHEXOL 350 MG/ML SOLN  COMPARISON:  PA and lateral chest x-ray of February 22, 2014 and CT scan of the chest dated August 20, 2013  FINDINGS: Contrast within the pulmonary arterial tree is normal in appearance. There are no filling defects to suggest an acute pulmonary embolism. Review of the MIP images confirms the above findings. 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 The caliber of the thoracic aorta is normal. Since the previous study small bilateral pleural effusions have developed. There is no pericardial effusion. The cardiac chambers are normal in size. There is no bulky mediastinal nor hilar lymphadenopathy. There is chronic enlargement of the left thyroid lobe. Again demonstrated is abnormal soft tissue density in the left paravertebral region at approximately T8 and T9.  At lung window settings there has been interval development of fluffy partially confluent bilateral alveolar infiltrates. The upper lobes appear to be the most conspicuously involved.  Known skeletal  involvement by the patient's multiple myeloma is again demonstrated.  Within  the upper abdomen the observed portions of the liver appear normal. There are tiny radiodensities associated with the dependent portion of the gallbladder near the neck. The spleen is subjectively enlarged.  IMPRESSION: 1. There are new fluffy bilateral widespread pulmonary alveolar infiltrates consistent with pneumonia or pneumonitis. 2. There are new small bilateral pleural effusions layering posteriorly. 3. Stable findings of known skeletal and paravertebral involvement by the patient's known myeloma are demonstrated. 4. There is no evidence of an acute pulmonary embolism.   Electronically Signed   By: Romari Gasparro  [place]   On: 08/26/2013 17:07   Ct Angio Chest Pe W/cm &/or Wo Cm  08/20/2013   CLINICAL DATA:  Shortness of breath and fatigue. History of DVT. Multiple myeloma.  EXAM: CT ANGIOGRAPHY CHEST WITH CONTRAST  TECHNIQUE: Multidetector CT imaging of the chest was performed using the standard protocol during bolus administration of intravenous contrast. Multiplanar CT image reconstructions including MIPs were obtained to evaluate the vascular anatomy.  CONTRAST:  142m OMNIPAQUE IOHEXOL 350 MG/ML SOLN  COMPARISON:  05/13/2012  FINDINGS: THORACIC INLET/BODY WALL:  Enlargement of the left lobe thyroid gland with coarse calcifications, stable from 2013.  MEDIASTINUM:  Normal heart size. No pericardial effusion. Multi focal coronary artery atherosclerosis. No acute vascular abnormality, including pulmonary embolism or aortic dissection. No adenopathy.  LUNG WINDOWS:  No consolidation.  No effusion.  No suspicious pulmonary nodule.  UPPER ABDOMEN:  No acute findings.  OSSEOUS:  There are scattered lytic lesions that the imaged skeleton, consistent with myeloma. No acute fracture appreciated. The most notable findings in the thoracic spine is extensive osteolysis of the T9, T10, and T11 bodies, encompassing previously placed bone cement. There is increasing paravertebral soft tissue mass, bilaterally T9-10 and on the  left at T8. The nodule at the level of T8 measures 18 mm. Although anemic, the hemo below levels are not as low as would be expected for extramedullary hematopoiesis. Additionally, the paraspinal abnormalities are limited to levels with definite myeloma this involvement of the bone. Question new growth into the posterior canal at the level of T10. No acute fracture appreciated. Other notable lytic areas within the manubrium and T3 body.  Review of the MIP images confirms the above findings.  IMPRESSION: 1. Negative for pulmonary embolism or other acute intrathoracic abnormality. 2. Multiple myeloma. Compared to thoracic spine MRI 07/27/2013, new/larger paravertebral extension at the level of T8 and T9/10. If there are new thoracic cord symptoms, MRI could re-evaluate the spinal canal at T10.   Electronically Signed   By: JJorje GuildM.D.   On: 08/20/2013 03:41   Ir Fluoro Guide Cv Line Right  08/21/2013   INDICATION: History of multiple myeloma, in need of intravenous access for the initiation of chemotherapy.  EXAM: IMPLANTED PORT A CATH PLACEMENT WITH ULTRASOUND AND FLUOROSCOPIC GUIDANCE  MEDICATIONS: Vancomycin 1 g IV; IV antibiotic was given in an appropriate time interval prior to skin puncture.  ANESTHESIA/SEDATION: Versed 3 mg IV; Fentanyl 100 mcg IV;  Total Moderate Sedation Time  30  minutes.  CONTRAST:  None  COMPARISON:  None.  FLUOROSCOPY TIME:  48 seconds.  PROCEDURE: The procedure, risks, benefits, and alternatives were explained to the patient. Questions regarding the procedure were encouraged and answered. The patient understands and consents to the procedure.  The right neck and chest were prepped with chlorhexidine in a sterile fashion, and a sterile drape was applied covering the operative field.  Maximum barrier sterile technique with sterile gowns and gloves were used for the procedure. A timeout was performed prior to the initiation of the procedure. Local anesthesia was provided with 1%  lidocaine with epinephrine.  After creating a small venotomy incision, a micropuncture kit was utilized to access the internal jugular vein under direct, real-time ultrasound guidance. Ultrasound image documentation was performed. The microwire was kinked to measure appropriate catheter length.  A subcutaneous port pocket was then created along the upper chest wall utilizing a combination of sharp and blunt dissection. The pocket was irrigated with sterile saline. A single lumen ISP power injectable port was chosen for placement. The 8 Fr catheter was tunneled from the port pocket site to the venotomy incision. The port was placed in the pocket. The external catheter was trimmed to appropriate length. At the venotomy, an 8 Fr peel-away sheath was placed over a guidewire under fluoroscopic guidance. The catheter was then placed through the sheath and the sheath was removed. Final catheter positioning was confirmed and documented with a fluoroscopic spot radiograph. The port was accessed with a Huber needle, aspirated and flushed with heparinized saline.  The venotomy site was closed with an interrupted 4-0 Vicryl suture. The port pocket incision was closed with interrupted 2-0 Vicryl suture and the skin was opposed with a running subcuticular 4-0 Vicryl suture. Dermabond and Steri-strips were applied to both incisions. Dressings were placed. The patient tolerated the procedure well without immediate post procedural complication.  COMPLICATIONS: None immediate  FINDINGS: After catheter placement, the tip lies within the superior cavoatrial junction. The catheter aspirates and flushes normally and is ready for immediate use.  IMPRESSION: Successful placement of a right internal jugular approach power injectable Port-A-Cath. The catheter is ready for immediate use.   Electronically Signed   By: Sandi Mariscal M.D.   On: 08/21/2013 16:05   Ir US Guide Vasc Access Right  08/21/2013   INDICATION: History of multiple  myeloma, in need of intravenous access for the initiation of chemotherapy.  EXAM: IMPLANTED PORT A CATH PLACEMENT WITH ULTRASOUND AND FLUOROSCOPIC GUIDANCE  MEDICATIONS: Vancomycin 1 g IV; IV antibiotic was given in an appropriate time interval prior to skin puncture.  ANESTHESIA/SEDATION: Versed 3 mg IV; Fentanyl 100 mcg IV;  Total Moderate Sedation Time  30  minutes.  CONTRAST:  None  COMPARISON:  None.  FLUOROSCOPY TIME:  48 seconds.  PROCEDURE: The procedure, risks, benefits, and alternatives were explained to the patient. Questions regarding the procedure were encouraged and answered. The patient understands and consents to the procedure.  The right neck and chest were prepped with chlorhexidine in a sterile fashion, and a sterile drape was applied covering the operative field. Maximum barrier sterile technique with sterile gowns and gloves were used for the procedure. A timeout was performed prior to the initiation of the procedure. Local anesthesia was provided with 1% lidocaine with epinephrine.  After creating a small venotomy incision, a micropuncture kit was utilized to access the internal jugular vein under direct, real-time ultrasound guidance. Ultrasound image documentation was performed. The microwire was kinked to measure appropriate catheter length.  A subcutaneous port pocket was then created along the upper chest wall utilizing a combination of sharp and blunt dissection. The pocket was irrigated with sterile saline. A single lumen ISP power injectable port was chosen for placement. The 8 Fr catheter was tunneled from the port pocket site to the venotomy incision. The port was placed in the pocket. The external catheter was trimmed  to appropriate length. At the venotomy, an 8 Fr peel-away sheath was placed over a guidewire under fluoroscopic guidance. The catheter was then placed through the sheath and the sheath was removed. Final catheter positioning was confirmed and documented with a  fluoroscopic spot radiograph. The port was accessed with a Huber needle, aspirated and flushed with heparinized saline.  The venotomy site was closed with an interrupted 4-0 Vicryl suture. The port pocket incision was closed with interrupted 2-0 Vicryl suture and the skin was opposed with a running subcuticular 4-0 Vicryl suture. Dermabond and Steri-strips were applied to both incisions. Dressings were placed. The patient tolerated the procedure well without immediate post procedural complication.  COMPLICATIONS: None immediate  FINDINGS: After catheter placement, the tip lies within the superior cavoatrial junction. The catheter aspirates and flushes normally and is ready for immediate use.  IMPRESSION: Successful placement of a right internal jugular approach power injectable Port-A-Cath. The catheter is ready for immediate use.   Electronically Signed   By: Sandi Mariscal M.D.   On: 08/21/2013 16:05   Dg Chest Port 1 View  08/31/2013   CLINICAL DATA:  Pulmonary infiltrates. Cough and shortness of breath.  EXAM: PORTABLE CHEST - 1 VIEW  COMPARISON:  08/29/2013, 08/28/2013, and 08/25/2013 and chest CT dated 08/26/2013  FINDINGS: Power port is in place unchanged. NG tube and endotracheal tube a been removed. There is significant partial clearing of the bilateral pulmonary infiltrates. Heart size and vascularity are normal. No visible effusions.  IMPRESSION: Partial clearing of the extensive bilateral pulmonary infiltrates.   Electronically Signed   By: Rozetta Nunnery M.D.   On: 08/31/2013 13:32   Dg Chest Port 1 View  08/29/2013   CLINICAL DATA:  Respiratory failure.  EXAM: PORTABLE CHEST - 1 VIEW  COMPARISON:  Single view of the chest 08/28/2013 and 08/27/2013.  FINDINGS: Support tubes and lines are unchanged. Extensive bilateral airspace disease persists without marked change. There is no pneumothorax or pleural effusion. Heart size is normal. Multilevel vertebral augmentation noted.  IMPRESSION: No change in  extensive bilateral airspace disease.   Electronically Signed   By: Inge Rise M.D.   On: 08/29/2013 07:31   Dg Chest Port 1 View  08/28/2013   CLINICAL DATA:  Intubation.  EXAM: PORTABLE CHEST - 1 VIEW  COMPARISON:  08/27/2013.  FINDINGS: Endotracheal tube tip noted 2.3 cm above the carina. Central line in stable position. NG tube tip below the left hemidiaphragm. Bilateral dense pulmonary alveolar infiltrates are present and unchanged. No pleural effusion or pneumothorax. Heart size is normal. No acute osseous abnormality.  IMPRESSION: 1.  Stable line and tube positions.  2. Bilateral dense unchanged pulmonary alveolar infiltrates. This can't be seen with pulmonary edema, ARDS, and /or bilateral pneumonia .   Electronically Signed   By: Marcello Moores  [place]   On: 08/28/2013 07:29   Portable Chest Xray  08/27/2013   CLINICAL DATA:  Acute respiratory failure. On ventilator. Healthcare associated pneumonia. Multiple myeloma.  EXAM: PORTABLE CHEST - 1 VIEW  COMPARISON:  08/25/2013  FINDINGS: Right-sided power port remains in place. New endotracheal tube is seen with tip approximately 2.5 cm above the carina.  Significant interval worsening of diffuse bilateral airspace disease is seen since previous study. Low lung volumes again noted. No evidence of pneumothorax or pleural effusion. Heart size is normal.  IMPRESSION: Endotracheal tube in appropriate position. Significant worsening of diffuse bilateral airspace disease.   Electronically Signed   By: Sharrie Rothman.D.  On: 08/27/2013 11:58         Subjective: Patient states that back pain is about 40% better when he receives intravenous opioids. Denies any fevers, chills, chest discomfort, nausea, vomiting, diarrhea. Denies any headaches, dizziness, rashes. He states that he has no reserve regarding dyspnea on exertion.  Objective: Filed Vitals:   09/01/13 0800 09/01/13 0810 09/01/13 1124 09/01/13 1323  BP:  151/68 143/88   Pulse: 93 97 129 130   Temp: 98.4 F (36.9 C)     TempSrc: Oral     Resp: _0 Height:      Weight:      SpO2: 97% 97% 100% 98%    Intake/Output Summary (Last 24 hours) at 09/01/13 1331 Last data filed at 09/01/13 1318  Gross per 24 hour  Intake 1552.17 ml  Output   2925 ml  Net -1372.83 ml   Weight change: -1.6 kg (-3 lb 8.4 oz) Exam:   General:  Pt is alert, follows commands appropriately, not in acute distress  HEENT: No icterus, No thrush, [place]/AT  Cardiovascular: RRR, S1/S2, no rubs, no gallops  Respiratory: Bilateral crackles. No wheezing.  Abdomen: Soft/+BS, non tender, non distended, no guarding  Extremities: trace LE edema, No lymphangitis, No petechiae, No rashes, no synovitis  Data Reviewed: Basic Metabolic Panel:  Recent Labs Lab 08/28/13 0530 08/29/13 0446 08/30/13 0535 08/31/13 0515 09/01/13 0700  NA 141 139 140 139 136*  K 4.0 3.1* 4.0 3.6* 4.2  CL 108 110 110 105 102  CO2 _1 GLUCOSE 87 108* 99 116* 89  BUN 19 24* _2 CREATININE 0.67 0.56 0.56 0.57 0.50  CALCIUM 7.3* 6.4* 7.5* 7.6* 7.8*  MG 2.5  --   --   --   --   PHOS 2.7  --   --   --   --    Liver Function Tests:  Recent Labs Lab 08/25/13 1450  AST 33  ALT 23  ALKPHOS 123*  BILITOT 0.3  PROT 7.9  ALBUMIN 2.2*   No results found for this basename: LIPASE, AMYLASE,  in the last 168 hours No results found for this basename: AMMONIA,  in the last 168 hours CBC:  Recent Labs Lab 08/25/13 1450  08/29/13 0446 08/29/13 1730 08/30/13 0535 08/31/13 0515 09/01/13 0700  WBC 6.2  < > 4.2 4.2 4.7 4.3 4.3  NEUTROABS 3.8  --  1.3* 1.2*  --  0.9*  --   HGB 7.7*  < > 6.5* 7.0* 8.1* 8.2* 8.4*  HCT 23.2*  < > 19.9* 21.6* 24.6* 25.5* 25.1*  MCV 95.5  < > 94.3 95.2 93.5 93.8 92.3  PLT 19*  < > 31* 34* 35* 34* PLATELET CLUMPS NOTED ON SMEAR, COUNT APPEARS DECREASED  < > = values in this interval not displayed. Cardiac Enzymes:  Recent Labs Lab 08/25/13 2145 08/26/13 0233  08/26/13 0840  TROPONINI <0.30 <0.30 <0.30   BNP: No components found with this basename: POCBNP,  CBG:  Recent Labs Lab 08/31/13 0813 08/31/13 1210 08/31/13 1607 08/31/13 2147 09/01/13 0737  GLUCAP 108* 166* 88 91 84    Recent Results (from the past 240 hour(s))  CULTURE, BLOOD (ROUTINE X 2)     Status: None   Collection Time    08/25/13  9:40 PM      Result Value Range Status   Specimen Description BLOOD RIGHT ARM   Final   Special Requests BOTTLES DRAWN AEROBIC  AND ANAEROBIC 8CC   Final   Culture  Setup Time     Final   Value: 08/26/2013 04:52     Performed at [facility]   Culture     Final   Value: NO GROWTH 5 DAYS     Performed at [facility]   Report Status 09/01/2013 FINAL   Final  CULTURE, BLOOD (ROUTINE X 2)     Status: None   Collection Time    08/25/13  9:40 PM      Result Value Range Status   Specimen Description BLOOD LEFT ARM   Final   Special Requests BOTTLES DRAWN AEROBIC AND ANAEROBIC 8CC   Final   Culture  Setup Time     Final   Value: 08/26/2013 04:52     Performed at [facility]   Culture     Final   Value: NO GROWTH 5 DAYS     Performed at [facility]   Report Status 09/01/2013 FINAL   Final  CULTURE, RESPIRATORY (NON-EXPECTORATED)     Status: None   Collection Time    08/27/13  4:35 PM      Result Value Range Status   Specimen Description BRONCHIAL ALVEOLAR LAVAGE   Final   Special Requests Normal   Final   Gram Stain     Final   Value: NO WBC SEEN     NO SQUAMOUS EPITHELIAL CELLS SEEN     NO ORGANISMS SEEN     Performed at [facility]   Culture     Final   Value: RARE YEAST CONSISTENT WITH CANDIDA SPECIES     Performed at [facility]   Report Status 08/30/2013 FINAL   Final  PNEUMOCYSTIS JIROVECI SMEAR BY DFA     Status: None   Collection Time    08/27/13  4:35 PM      Result Value Range Status   Specimen Source-PJSRC BRONCHIAL ALVEOLAR LAVAGE   Final   Pneumocystis jiroveci  Ag NEGATIVE   Final   Comment: Performed at [facility]  RESPIRATORY VIRUS PANEL     Status: None   Collection Time    08/27/13  4:50 PM      Result Value Range Status   Source - RVPAN NASOPHARYNGEAL   Final   Respiratory Syncytial Virus A NOT DETECTED   Final   Respiratory Syncytial Virus B NOT DETECTED   Final   Influenza A NOT DETECTED   Final   Influenza B NOT DETECTED   Final   Parainfluenza 1 NOT DETECTED   Final   Parainfluenza 2 NOT DETECTED   Final   Parainfluenza 3 NOT DETECTED   Final   Metapneumovirus NOT DETECTED   Final   Rhinovirus NOT DETECTED   Final   Adenovirus NOT DETECTED   Final   Influenza A H1 NOT DETECTED   Final   Influenza A H3 NOT DETECTED   Final   Comment: (NOTE)           Normal Reference Range for each Analyte: NOT DETECTED     Testing performed using the Luminex xTAG Respiratory Viral Panel test     kit.     This test was developed and its performance characteristics determined     by [facility]. It has not been cleared or approved by the [place]     Food and Drug Administration. This test is used for clinical purposes.  It should not be regarded as investigational or for research. This     laboratory is certified under the [place] (CLIA) as qualified to perform high complexity     clinical laboratory testing.     Performed at [facility], RESPIRATORY (NON-EXPECTORATED)     Status: None   Collection Time    08/28/13  8:19 AM      Result Value Range Status   Specimen Description TRACHEAL ASPIRATE   Final   Special Requests NONE   Final   Gram Stain     Final   Value: ABUNDANT WBC PRESENT, PREDOMINANTLY PMN     NO SQUAMOUS EPITHELIAL CELLS SEEN     RARE GRAM POSITIVE COCCI     IN PAIRS RARE YEAST     Performed at [facility]   Culture     Final   Value: FEW CANDIDA ALBICANS     Performed at [facility]   Report Status 08/30/2013 FINAL    Final  AFB CULTURE WITH SMEAR     Status: None   Collection Time    08/28/13  8:20 AM      Result Value Range Status   Specimen Description TRACHEAL ASPIRATE   Final   Special Requests NONE   Final   ACID FAST SMEAR     Final   Value: NO ACID FAST BACILLI SEEN     Performed at [facility]   Culture     Final   Value: CULTURE WILL BE EXAMINED FOR 6 WEEKS BEFORE ISSUING A FINAL REPORT     Performed at [facility]   Report Status PENDING   Incomplete  FUNGUS CULTURE W SMEAR     Status: None   Collection Time    08/28/13  8:21 AM      Result Value Range Status   Specimen Description TRACHEAL ASPIRATE   Final   Special Requests NONE   Final   Fungal Smear     Final   Value: RARE YEAST WITH PSEUDOHYPHAE     Performed at [facility]   Culture     Final   Value: CANDIDA ALBICANS     Performed at [facility]   Report Status PENDING   Incomplete  RESPIRATORY VIRUS PANEL     Status: None   Collection Time    08/30/13  9:50 PM      Result Value Range Status   Source - RVPAN NOSE   Final   Respiratory Syncytial Virus A NOT DETECTED   Final   Respiratory Syncytial Virus B NOT DETECTED   Final   Influenza A NOT DETECTED   Final   Influenza B NOT DETECTED   Final   Parainfluenza 1 NOT DETECTED   Final   Parainfluenza 2 NOT DETECTED   Final   Parainfluenza 3 NOT DETECTED   Final   Metapneumovirus NOT DETECTED   Final   Rhinovirus NOT DETECTED   Final   Adenovirus NOT DETECTED   Final   Influenza A H1 NOT DETECTED   Final   Influenza A H3 NOT DETECTED   Final   Comment: (NOTE)           Normal Reference Range for each Analyte: NOT DETECTED     Testing performed using the Luminex xTAG Respiratory Viral Panel test     kit.     This test was  developed and its performance characteristics determined     by [facility]. It has not been cleared or approved by the [place]     Food and Drug Administration. This test is used for clinical purposes.      It should not be regarded as investigational or for research. This     laboratory is certified under the [place] (CLIA) as qualified to perform high complexity     clinical laboratory testing.     Performed at [facility]     Scheduled Meds: . acyclovir  400 mg Oral Daily  . antiseptic oral rinse  15 mL Mouth Rinse QID  . aztreonam  1 g Intravenous Q8H  . chlorhexidine  15 mL Mouth Rinse BID  . feeding supplement (ENSURE COMPLETE)  237 mL Oral TID WC  . fentaNYL  50 mcg Transdermal Q72H  . insulin aspart  0-9 Units Subcutaneous TID WC  . predniSONE  10 mg Per Tube Q breakfast  . sodium chloride  3 mL Intravenous Q12H  . vancomycin  1,250 mg Intravenous Q12H   Continuous Infusions: . sodium chloride 10 mL/hr at 09/01/13 0313     Lenyx Boody, DO  [facility] Pager [phone]  If 7PM-7AM, please contact night-coverage [email or web address] Password [email or web address] 09/01/2013, 1:31 PM   LOS: 7 days

## 2013-09-01 NOTE — Progress Notes (Signed)
More alert.  O2 levels being maintained on nasal cannula. Appetitie is better.  Biggest problem is back pain. Plain X-rays are pending.  I looked at them and possibly an issue with L1? He started the Duragesic patch yesterday - this should "kick in" today. We really need to try for another MRI. I would like to think that he may qualify for another kyphoplasty.  He REALLY want to get out of the ICU. He hates his bed and once on the floor, we can use an air mattress overlay. This worked very well for him with his last admission.  Labs look ok. WBC is stable. Hbg slowly improving. He has had no breathing issues.  Calcium level is being well controilled. No evidence clinically of bleeding.  VSS. Lungs have better breath sounds. Cardianc exam is RRR.  ABD is soft.  (+) BS  (-) HSM. EXTREMITIES show some mild edema.     Hopefully, he can move out of the ICU.  He will need a lot of PT.  I don't know if he needs a back brace.    I suspect that he will need Rehab upon d/c.  The myeloma still needs to be treated.  Pete E.

## 2013-09-01 NOTE — Progress Notes (Signed)
Report called to Lind, Therapist, sports.  Patient transferring to room 1419 via hospital bed.  Patient called his wife and notified her of the room number.

## 2013-09-01 NOTE — Progress Notes (Signed)
PT Cancellation Note  Patient Details Name: Robert Berger MRN: [id number] DOB: [birth year]   Cancelled Treatment:    Reason Eval/Treat Not Completed: Medical issues which prohibited therapy Pt with increased back pain requiring IV meds and xrays still pending.  TLSO brace also ordered and not yet delivered.   Robert Berger,Robert Berger 09/01/2013, 2:46 PM Carmelia Bake, PT, DPT 09/01/2013 Pager: [phone]

## 2013-09-01 NOTE — Progress Notes (Signed)
Patient removed huber needle from his port-a-cath.  Patient stated "I thought that it was a sticker so I pulled it off".  The site had no bleeding.  Wiped out with alcohol wipe and placed a dry gauze dressing. Notified the IV team.  IV team RN came and reassessed the port a cath shortly after it was removed.

## 2013-09-02 ENCOUNTER — Inpatient Hospital Stay (HOSPITAL_COMMUNITY): Payer: Medicare Other

## 2013-09-02 DIAGNOSIS — D6481 Anemia due to antineoplastic chemotherapy: Secondary | ICD-10-CM

## 2013-09-02 DIAGNOSIS — T451X5A Adverse effect of antineoplastic and immunosuppressive drugs, initial encounter: Secondary | ICD-10-CM

## 2013-09-02 DIAGNOSIS — I5031 Acute diastolic (congestive) heart failure: Secondary | ICD-10-CM

## 2013-09-02 DIAGNOSIS — G8929 Other chronic pain: Secondary | ICD-10-CM

## 2013-09-02 DIAGNOSIS — I509 Heart failure, unspecified: Secondary | ICD-10-CM

## 2013-09-02 DIAGNOSIS — I10 Essential (primary) hypertension: Secondary | ICD-10-CM

## 2013-09-02 LAB — BASIC METABOLIC PANEL
BUN: 16 mg/dL (ref 6–23)
CO2: 31 meq/L (ref 19–32)
Calcium: 8.4 mg/dL (ref 8.4–10.5)
Chloride: 101 mEq/L (ref 96–112)
Creatinine, Ser: 0.72 mg/dL (ref 0.50–1.35)
GFR calc Af Amer: >90 mL/min (ref >90)
GFR calc non Af Amer: >90 mL/min (ref >90)
Glucose, Bld: 121 mg/dL — ABNORMAL HIGH (ref 70–99)
POTASSIUM: 4.1 meq/L (ref 3.7–5.3)
SODIUM: 138 meq/L (ref 137–147)

## 2013-09-02 LAB — CBC
HCT: 25.6 % — ABNORMAL LOW (ref 39.0–52.0)
Hemoglobin: 8.3 g/dL — ABNORMAL LOW (ref 13.0–17.0)
MCH: 30.2 pg (ref 26.0–34.0)
MCHC: 32.4 g/dL (ref 30.0–36.0)
MCV: 93.1 fL (ref 78.0–100.0)
PLATELETS: 38 10*3/uL — AB (ref 150–400)
RBC: 2.75 MIL/uL — ABNORMAL LOW (ref 4.22–5.81)
RDW: 19 % — ABNORMAL HIGH (ref 11.5–15.5)
WBC: 4.6 10*3/uL (ref 4.0–10.5)

## 2013-09-02 LAB — GLUCOSE, CAPILLARY
GLUCOSE-CAPILLARY: 149 mg/dL — AB (ref 70–99)
GLUCOSE-CAPILLARY: 126 mg/dL — AB (ref 70–99)
Glucose-Capillary: 91 mg/dL (ref 70–99)

## 2013-09-02 MED ORDER — PREDNISONE 5 MG PO TABS
5.0000 mg | ORAL_TABLET | Freq: Every day | ORAL | Status: DC
  Filled 2013-09-02 (×2): qty 1

## 2013-09-02 MED ORDER — FUROSEMIDE 40 MG PO TABS
40.0000 mg | ORAL_TABLET | Freq: Every day | ORAL | Status: DC
  Administered 2013-09-02: 40 mg via ORAL
  Filled 2013-09-02 (×2): qty 1

## 2013-09-02 NOTE — Discharge Summary (Addendum)
Physician Discharge Summary  Robert Berger:[id number] DOB: 07/30/1941 DOA: 08/25/2013  PCP: Nance Pear., NP  Admit date: 08/25/2013 Discharge date: 09/03/13 Recommendations for Outpatient Follow-up:  1. Pt will need to follow up with PCP in 2 weeks post discharge 2. Please obtain BMP to evaluate electrolytes and kidney function in 1 week 3. Please also check CBC to evaluate Hg and Hct levels   Discharge Diagnoses:  Active Problems:   Lytic bone lesions on xray   GERD (gastroesophageal reflux disease)   Multiple myeloma   HTN (hypertension)   Physical deconditioning   HCAP (healthcare-associated pneumonia)   Acute diastolic CHF (congestive heart failure) Acute respiratory failure  -Due to HCAP and CHF  -Extubated 08/29/2013  -Continue incentives spirometry  -proBNP 437  -1/18 echo > LVEF 60-65%, LAE  -Influenza PCR negative  -Repeat CXR 08/31/13--some improvement in b/l infiltrates  -suspect degree of fluid overload as pt is clinically improving with neg fluid balance--repeat lasix IV  -08/26/13 CTA chest negative for PE  HCAP  -Continue vancomycin and aztreonam--continue abx through 09/01/13  -all BAL cultures nondiagnostic--only shows Candida sp.  -blood cultures neg  -d/c antibiotics--has finished 7 full days  Acute Diastolic CHF  -9/56/21 Echo--EF 60-65%  -rapid improvement clinically and rapid improvement of CXR with diuresis suggests pulmonary edema  -appears more euvolemic now  -negative 6.4kg, neg 5.8L  -switch to po lasix--pt to go home with lasix $RemoveBe'40mg'SIFIrTxPJ$  daily -metoprolol tartrate 25 mg bid -The patient was instructed to weigh himself on a daily basis and to contact his primary care physician if he gains more than 3 pounds on successive days or more than 5 pounds over a period of 3 days. Myeloma/pancytopenia  -Last treated w/ Carfilzomib on 1/16 and cytoxan on 1/15  -Ennever following  -History of PE/DVT--complete a course of Coumadin  -Received 1 unit  PRBC 08/29/2013  Chronic back pain  -Patient experienced worsening-->improving slowly with fentanyl 75mcg and dilaudid $RemoveBefo'2mg'OhmBSJoUmdv$  every 2 hours  -MRI ordered by Dr. Filomena Jungling could not tolerate  -increased fentanyl 08/31/13 to 32mcg  -add TLSO brace  -PT eval  -When allowed to elaborate on his opioid needs, the patient has some opioid seeking behavior--Dr. Marin Olp increased his fentanyl patch to 44mcg on the day of d/c Family Communication: wife at beside  Disposition Plan: Home 09/03/13 if stable  Antibiotics:  Vancomycin 08/25/2013>>> 09/02/13  Aztreonam 08/25/2013>>>09/02/13   Discharge Condition: Stable  Disposition:  home  Diet: Heart healthy Wt Readings from Last 3 Encounters:  09/03/13 84.46 kg (186 lb 3.2 oz)  08/13/13 88.451 kg (195 lb)  08/07/13 88.043 kg (194 lb 1.6 oz)   Interval summary  73 year old male with a history of myeloma, DVT/PE, was admitted on 08/25/2013 with progressive respiratory failure in the setting of bilateral pulmonary infiltrates. The patient was started on azithromycin, vancomycin and aztreonam on 08/25/2013. CT angiogram of the chest on 08/26/2013 showed bilateral fluffy pulmonary alveolar infiltrates and new bilateral small pleural effusions. It was negative for pulmonary embolus. The patient experienced progressive respiratory failure and was intubated on 08/27/2013. PCCM was consulted. Bronchoscopy was performed on 08/27/2013. All BAL cultures including PCP are nondiagnostic. Bronchoscopy revealed airway inflammation without any lesions. The patient was treated for HCAP and gradually improved. He was extubated on 08/29/2013. The patient was given furosemide x2 on 08/30/2013 with continued improvement of his respiratory status. The patient had significant improvement on his chest x-ray to diuresis. Given his elevated proBNP and clinical response, the patient was felt to  have acute diastolic CHF. He was continued on oral furosemide. His renal function remains  stable. The patient was not able to tolerate an MRI of his back. Lumbar spine x-rays were negative for any acute fractures. A TLSO brace was ordered for patient and PT was asked to evaluate and treat patient.  When allowed to elaborate on his opioid needs, the patient exhibited some opioid seeking behavior. Overall, his pain improved and remained stable. His antibiotics were discontinued after 7 complete days. He remained afebrile and hemodynamically stable.      Consultants: Med Onc--Ennever  Discharge Exam: Filed Vitals:   09/03/13 1329  BP: 136/68  Pulse: 97  Temp: 98 F (36.7 C)  Resp: 22   Filed Vitals:   09/02/13 2130 09/03/13 0505 09/03/13 1012 09/03/13 1329  BP: 133/72 124/79 103/63 136/68  Pulse: 86 106 107 97  Temp: 98.2 F (36.8 C) 97.5 F (36.4 C)  98 F (36.7 C)  TempSrc: Oral Oral  Oral  Resp: $Remo'20 20  22  'efXST$ Height:      Weight:  84.46 kg (186 lb 3.2 oz)    SpO2: 100% 100%  98%   General: A&O x 3, NAD, pleasant, cooperative Cardiovascular: RRR, no rub, no gallop, no S3 Respiratory: CTAB, no wheeze, no rhonchi Abdomen:soft, nontender, nondistended, positive bowel sounds Extremities: trace LE edema, No lymphangitis, no petechiae  Discharge Instructions      Discharge Orders   Future Appointments Provider Department Dept Phone   09/05/2013 10:45 AM Gwendolyn Carrizales [phone]   09/05/2013 11:15 AM [facility] [place] [phone]   09/06/2013 1:30 PM [facility] [place] [phone]   09/19/2013 8:15 AM Gwendolyn [place] [phone]   09/19/2013 8:30 AM Volanda Napoleon, MD [place] [phone]   09/19/2013 9:30 AM [facility] [place] [phone]   09/20/2013 1:00 PM [facility] [place] [phone]   Future  Orders Complete By Expires   Diet - low sodium heart healthy  As directed    Diet - low sodium heart healthy  As directed    Discharge instructions  As directed    Comments:     Weigh yoursefl daily.  Call your primary care physician if you gain more than 3 pounds any given day or more than 5 pounds over 3 days.   Increase activity slowly  As directed    Increase activity slowly  As directed        Medication List    STOP taking these medications       diltiazem 120 MG 24 hr capsule  Commonly known as:  CARDIZEM CD     fentaNYL 25 MCG/HR patch  Commonly known as:  DURAGESIC  Replaced by:  fentaNYL 75 MCG/HR     meloxicam 15 MG tablet  Commonly known as:  MOBIC      TAKE these medications       acyclovir 400 MG tablet  Commonly known as:  ZOVIRAX  Take 1 tablet (400 mg total) by mouth daily.     aspirin 81 MG tablet  Take 162 mg by mouth every morning.     baclofen 10 MG tablet  Commonly known as:  LIORESAL  Take 10 mg by mouth 3 (three) times  daily.     fentaNYL 75 MCG/HR  Commonly known as:  DURAGESIC - dosed mcg/hr  Place 1 patch (75 mcg total) onto the skin every 3 (three) days.     furosemide 40 MG tablet  Commonly known as:  LASIX  Take 1 tablet (40 mg total) by mouth daily.     HYDROmorphone 2 MG tablet  Commonly known as:  DILAUDID  Take 1 tablet (2 mg total) by mouth every 4 (four) hours as needed for severe pain.     lansoprazole 30 MG capsule  Commonly known as:  PREVACID  Take 30 mg by mouth daily at 12 noon.     lidocaine-prilocaine cream  Commonly known as:  EMLA  Apply 1 application topically as needed (before chemo).     lisinopril 2.5 MG tablet  Commonly known as:  PRINIVIL,ZESTRIL  Take 2.5 mg by mouth daily with breakfast.     LORazepam 0.5 MG tablet  Commonly known as:  ATIVAN  Take 1 tablet (0.5 mg total) by mouth every 6 (six) hours as needed (Nausea or vomiting).     metoprolol tartrate 25 MG tablet  Commonly known as:   LOPRESSOR  Take 1 tablet (25 mg total) by mouth 2 (two) times daily.     ondansetron 8 MG tablet  Commonly known as:  ZOFRAN  Take 1 tablet (8 mg total) by mouth 2 (two) times daily. Take two times a day starting the day after chemo for 2 days. Then take two times a day as needed for nausea or vomiting.     predniSONE 20 MG tablet  Commonly known as:  DELTASONE  Take 10 mg by mouth daily with breakfast. Take 1 pill ady with food.     prochlorperazine 10 MG tablet  Commonly known as:  COMPAZINE  Take 1 tablet (10 mg total) by mouth every 6 (six) hours as needed (Nausea or vomiting).     vitamin B-6 250 MG tablet  Take 250 mg by mouth every morning.         The results of significant diagnostics from this hospitalization (including imaging, microbiology, ancillary and laboratory) are listed below for reference.    Significant Diagnostic Studies: Dg Chest 2 View  08/25/2013   CLINICAL DATA:  Hypoxia, dyspnea, and chest discomfort with history of multiple myeloma and pulmonary embolism. Marland Kitchen  EXAM: CHEST  2 VIEW  COMPARISON:  CT scan of the chest dated August 20, 2013.  FINDINGS: Since the previous study there have developed fluffy alveolar infiltrates bilaterally. The cardiopericardial silhouette does not appear enlarged. There is no pleural effusion. The mediastinum is normal in width. The Port-A-Cath appliance is unchanged in appearance. The patient has undergone kyphoplasty at the T10 through L1 levels.  IMPRESSION: 1. New fluffy alveolar densities in both lungs are worrisome for pneumonia. CHF is not problematic Lee excluded. 2. There is no evidence of a pleural effusion. 3. The patient has known paravertebral soft tissue masses consistent with myeloma. These are not clearly evident on today's study.   Electronically Signed   By: Nastasha Reising  [place]   On: 08/25/2013 13:26   Dg Lumbar Spine 2-3 Views  09/01/2013   CLINICAL DATA:  Low back pain.  Multiple myeloma.  EXAM: LUMBAR SPINE - 2-3 VIEW   COMPARISON:  Lumbar spine MRI on 07/07/2013  FINDINGS: No evidence of acute lumbar spine fracture or subluxation. Old vertebral body compression fractures are seen in the lower thoracic spine with previous vertebroplasties noted at these  levels. Previous vertebroplasty also noted at L3. Mild degenerative disc disease is seen at all lumbar levels. No new lytic or destructive bone lesions visualized.  IMPRESSION: No acute findings.  Mild multilevel degenerative disc disease.  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   Electronically Signed   By: Earle Gell M.D.   On: 09/01/2013 15:12   Ct Angio Chest Pe W/cm &/or Wo Cm  08/26/2013   CLINICAL DATA:  Unexplained persistent dyspnea and tachycardia, history of multiple myeloma and previous history of pulmonary embolism.  EXAM: CT ANGIOGRAPHY CHEST WITH CONTRAST  TECHNIQUE: Multidetector CT imaging of the chest was performed using the standard protocol during bolus administration of intravenous contrast. Multiplanar CT image reconstructions including MIPs were obtained to evaluate the vascular anatomy.  CONTRAST:  111mL OMNIPAQUE IOHEXOL 350 MG/ML SOLN  COMPARISON:  PA and lateral chest x-ray of February 22, 2014 and CT scan of the chest dated August 20, 2013  FINDINGS: Contrast within the pulmonary arterial tree is normal in appearance. There are no filling defects to suggest an acute pulmonary embolism. Review of the MIP images confirms the above findings. 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 The caliber of the thoracic aorta is normal. Since the previous study small bilateral pleural effusions have developed. There is no pericardial effusion. The cardiac chambers are normal in size. There is no bulky mediastinal nor hilar lymphadenopathy. There is chronic enlargement of the left thyroid lobe. Again demonstrated is abnormal soft tissue density in the left paravertebral region at approximately T8 and T9.  At lung window  settings there has been interval development of fluffy partially confluent bilateral alveolar infiltrates. The upper lobes appear to be the most conspicuously involved.  Known skeletal involvement by the patient's multiple myeloma is again demonstrated.  Within the upper abdomen the observed portions of the liver appear normal. There are tiny radiodensities associated with the dependent portion of the gallbladder near the neck. The spleen is subjectively enlarged.  IMPRESSION: 1. There are new fluffy bilateral widespread pulmonary alveolar infiltrates consistent with pneumonia or pneumonitis. 2. There are new small bilateral pleural effusions layering posteriorly. 3. Stable findings of known skeletal and paravertebral involvement by the patient's known myeloma are demonstrated. 4. There is no evidence of an acute pulmonary embolism.   Electronically Signed   By: Zacharie Portner  [place]   On: 08/26/2013 17:07   Ct Angio Chest Pe W/cm &/or Wo Cm  08/20/2013   CLINICAL DATA:  Shortness of breath and fatigue. History of DVT. Multiple myeloma.  EXAM: CT ANGIOGRAPHY CHEST WITH CONTRAST  TECHNIQUE: Multidetector CT imaging of the chest was performed using the standard protocol during bolus administration of intravenous contrast. Multiplanar CT image reconstructions including MIPs were obtained to evaluate the vascular anatomy.  CONTRAST:  134mL OMNIPAQUE IOHEXOL 350 MG/ML SOLN  COMPARISON:  05/13/2012  FINDINGS: THORACIC INLET/BODY WALL:  Enlargement of the left lobe thyroid gland with coarse calcifications, stable from 2013.  MEDIASTINUM:  Normal heart size. No pericardial effusion. Multi focal coronary artery atherosclerosis. No acute vascular abnormality, including pulmonary embolism or aortic dissection. No adenopathy.  LUNG WINDOWS:  No consolidation.  No effusion.  No suspicious pulmonary nodule.  UPPER ABDOMEN:  No acute findings.  OSSEOUS:  There are scattered lytic lesions that the imaged skeleton, consistent with  myeloma. No acute fracture appreciated. The most notable findings in the thoracic spine is extensive osteolysis of the T9, T10, and T11 bodies, encompassing previously placed bone cement. There is  increasing paravertebral soft tissue mass, bilaterally T9-10 and on the left at T8. The nodule at the level of T8 measures 18 mm. Although anemic, the hemo below levels are not as low as would be expected for extramedullary hematopoiesis. Additionally, the paraspinal abnormalities are limited to levels with definite myeloma this involvement of the bone. Question new growth into the posterior canal at the level of T10. No acute fracture appreciated. Other notable lytic areas within the manubrium and T3 body.  Review of the MIP images confirms the above findings.  IMPRESSION: 1. Negative for pulmonary embolism or other acute intrathoracic abnormality. 2. Multiple myeloma. Compared to thoracic spine MRI 07/27/2013, new/larger paravertebral extension at the level of T8 and T9/10. If there are new thoracic cord symptoms, MRI could re-evaluate the spinal canal at T10.   Electronically Signed   By: Jorje Guild M.D.   On: 08/20/2013 03:41   Ir Fluoro Guide Cv Line Right  08/21/2013   INDICATION: History of multiple myeloma, in need of intravenous access for the initiation of chemotherapy.  EXAM: IMPLANTED PORT A CATH PLACEMENT WITH ULTRASOUND AND FLUOROSCOPIC GUIDANCE  MEDICATIONS: Vancomycin 1 g IV; IV antibiotic was given in an appropriate time interval prior to skin puncture.  ANESTHESIA/SEDATION: Versed 3 mg IV; Fentanyl 100 mcg IV;  Total Moderate Sedation Time  30  minutes.  CONTRAST:  None  COMPARISON:  None.  FLUOROSCOPY TIME:  48 seconds.  PROCEDURE: The procedure, risks, benefits, and alternatives were explained to the patient. Questions regarding the procedure were encouraged and answered. The patient understands and consents to the procedure.  The right neck and chest were prepped with chlorhexidine in a sterile  fashion, and a sterile drape was applied covering the operative field. Maximum barrier sterile technique with sterile gowns and gloves were used for the procedure. A timeout was performed prior to the initiation of the procedure. Local anesthesia was provided with 1% lidocaine with epinephrine.  After creating a small venotomy incision, a micropuncture kit was utilized to access the internal jugular vein under direct, real-time ultrasound guidance. Ultrasound image documentation was performed. The microwire was kinked to measure appropriate catheter length.  A subcutaneous port pocket was then created along the upper chest wall utilizing a combination of sharp and blunt dissection. The pocket was irrigated with sterile saline. A single lumen ISP power injectable port was chosen for placement. The 8 Fr catheter was tunneled from the port pocket site to the venotomy incision. The port was placed in the pocket. The external catheter was trimmed to appropriate length. At the venotomy, an 8 Fr peel-away sheath was placed over a guidewire under fluoroscopic guidance. The catheter was then placed through the sheath and the sheath was removed. Final catheter positioning was confirmed and documented with a fluoroscopic spot radiograph. The port was accessed with a Huber needle, aspirated and flushed with heparinized saline.  The venotomy site was closed with an interrupted 4-0 Vicryl suture. The port pocket incision was closed with interrupted 2-0 Vicryl suture and the skin was opposed with a running subcuticular 4-0 Vicryl suture. Dermabond and Steri-strips were applied to both incisions. Dressings were placed. The patient tolerated the procedure well without immediate post procedural complication.  COMPLICATIONS: None immediate  FINDINGS: After catheter placement, the tip lies within the superior cavoatrial junction. The catheter aspirates and flushes normally and is ready for immediate use.  IMPRESSION: Successful  placement of a right internal jugular approach power injectable Port-A-Cath. The catheter is ready for immediate  use.   Electronically Signed   By: Sandi Mariscal M.D.   On: 08/21/2013 16:05   Ir US Guide Vasc Access Right  08/21/2013   INDICATION: History of multiple myeloma, in need of intravenous access for the initiation of chemotherapy.  EXAM: IMPLANTED PORT A CATH PLACEMENT WITH ULTRASOUND AND FLUOROSCOPIC GUIDANCE  MEDICATIONS: Vancomycin 1 g IV; IV antibiotic was given in an appropriate time interval prior to skin puncture.  ANESTHESIA/SEDATION: Versed 3 mg IV; Fentanyl 100 mcg IV;  Total Moderate Sedation Time  30  minutes.  CONTRAST:  None  COMPARISON:  None.  FLUOROSCOPY TIME:  48 seconds.  PROCEDURE: The procedure, risks, benefits, and alternatives were explained to the patient. Questions regarding the procedure were encouraged and answered. The patient understands and consents to the procedure.  The right neck and chest were prepped with chlorhexidine in a sterile fashion, and a sterile drape was applied covering the operative field. Maximum barrier sterile technique with sterile gowns and gloves were used for the procedure. A timeout was performed prior to the initiation of the procedure. Local anesthesia was provided with 1% lidocaine with epinephrine.  After creating a small venotomy incision, a micropuncture kit was utilized to access the internal jugular vein under direct, real-time ultrasound guidance. Ultrasound image documentation was performed. The microwire was kinked to measure appropriate catheter length.  A subcutaneous port pocket was then created along the upper chest wall utilizing a combination of sharp and blunt dissection. The pocket was irrigated with sterile saline. A single lumen ISP power injectable port was chosen for placement. The 8 Fr catheter was tunneled from the port pocket site to the venotomy incision. The port was placed in the pocket. The external catheter was trimmed  to appropriate length. At the venotomy, an 8 Fr peel-away sheath was placed over a guidewire under fluoroscopic guidance. The catheter was then placed through the sheath and the sheath was removed. Final catheter positioning was confirmed and documented with a fluoroscopic spot radiograph. The port was accessed with a Huber needle, aspirated and flushed with heparinized saline.  The venotomy site was closed with an interrupted 4-0 Vicryl suture. The port pocket incision was closed with interrupted 2-0 Vicryl suture and the skin was opposed with a running subcuticular 4-0 Vicryl suture. Dermabond and Steri-strips were applied to both incisions. Dressings were placed. The patient tolerated the procedure well without immediate post procedural complication.  COMPLICATIONS: None immediate  FINDINGS: After catheter placement, the tip lies within the superior cavoatrial junction. The catheter aspirates and flushes normally and is ready for immediate use.  IMPRESSION: Successful placement of a right internal jugular approach power injectable Port-A-Cath. The catheter is ready for immediate use.   Electronically Signed   By: Sandi Mariscal M.D.   On: 08/21/2013 16:05   Dg Chest Port 1 View  09/02/2013   CLINICAL DATA:  Acute respiratory failure. Pneumonia. Pulmonary edema. Multiple myeloma.  EXAM: PORTABLE CHEST - 1 VIEW  COMPARISON:  08/31/2013  FINDINGS: Decrease and bilateral airspace disease is seen compared to previous study. Low lung volumes are again noted. No evidence of pleural effusion. Heart size is normal. Right-sided power port remains in appropriate position.  IMPRESSION: Continued improvement in bilateral airspace disease.   Electronically Signed   By: Earle Gell M.D.   On: 09/02/2013 07:28   Dg Chest Port 1 View  08/31/2013   CLINICAL DATA:  Pulmonary infiltrates. Cough and shortness of breath.  EXAM: PORTABLE CHEST - 1 VIEW  COMPARISON:  08/29/2013, 08/28/2013, and 08/25/2013 and chest CT dated  08/26/2013  FINDINGS: Power port is in place unchanged. NG tube and endotracheal tube a been removed. There is significant partial clearing of the bilateral pulmonary infiltrates. Heart size and vascularity are normal. No visible effusions.  IMPRESSION: Partial clearing of the extensive bilateral pulmonary infiltrates.   Electronically Signed   By: Rozetta Nunnery M.D.   On: 08/31/2013 13:32   Dg Chest Port 1 View  08/29/2013   CLINICAL DATA:  Respiratory failure.  EXAM: PORTABLE CHEST - 1 VIEW  COMPARISON:  Single view of the chest 08/28/2013 and 08/27/2013.  FINDINGS: Support tubes and lines are unchanged. Extensive bilateral airspace disease persists without marked change. There is no pneumothorax or pleural effusion. Heart size is normal. Multilevel vertebral augmentation noted.  IMPRESSION: No change in extensive bilateral airspace disease.   Electronically Signed   By: Inge Rise M.D.   On: 08/29/2013 07:31   Dg Chest Port 1 View  08/28/2013   CLINICAL DATA:  Intubation.  EXAM: PORTABLE CHEST - 1 VIEW  COMPARISON:  08/27/2013.  FINDINGS: Endotracheal tube tip noted 2.3 cm above the carina. Central line in stable position. NG tube tip below the left hemidiaphragm. Bilateral dense pulmonary alveolar infiltrates are present and unchanged. No pleural effusion or pneumothorax. Heart size is normal. No acute osseous abnormality.  IMPRESSION: 1.  Stable line and tube positions.  2. Bilateral dense unchanged pulmonary alveolar infiltrates. This can't be seen with pulmonary edema, ARDS, and /or bilateral pneumonia .   Electronically Signed   By: Marcello Moores  [place]   On: 08/28/2013 07:29   Portable Chest Xray  08/27/2013   CLINICAL DATA:  Acute respiratory failure. On ventilator. Healthcare associated pneumonia. Multiple myeloma.  EXAM: PORTABLE CHEST - 1 VIEW  COMPARISON:  08/25/2013  FINDINGS: Right-sided power port remains in place. New endotracheal tube is seen with tip approximately 2.5 cm above the  carina.  Significant interval worsening of diffuse bilateral airspace disease is seen since previous study. Low lung volumes again noted. No evidence of pneumothorax or pleural effusion. Heart size is normal.  IMPRESSION: Endotracheal tube in appropriate position. Significant worsening of diffuse bilateral airspace disease.   Electronically Signed   By: Earle Gell M.D.   On: 08/27/2013 11:58     Microbiology: Recent Results (from the past 240 hour(s))  CULTURE, BLOOD (ROUTINE X 2)     Status: None   Collection Time    08/25/13  9:40 PM      Result Value Range Status   Specimen Description BLOOD RIGHT ARM   Final   Special Requests BOTTLES DRAWN AEROBIC AND ANAEROBIC 8CC   Final   Culture  Setup Time     Final   Value: 08/26/2013 04:52     Performed at [facility]   Culture     Final   Value: NO GROWTH 5 DAYS     Performed at [facility]   Report Status 09/01/2013 FINAL   Final  CULTURE, BLOOD (ROUTINE X 2)     Status: None   Collection Time    08/25/13  9:40 PM      Result Value Range Status   Specimen Description BLOOD LEFT ARM   Final   Special Requests BOTTLES DRAWN AEROBIC AND ANAEROBIC 8CC   Final   Culture  Setup Time     Final   Value: 08/26/2013 04:52     Performed at [facility]  Culture     Final   Value: NO GROWTH 5 DAYS     Performed at [facility]   Report Status 09/01/2013 FINAL   Final  CULTURE, RESPIRATORY (NON-EXPECTORATED)     Status: None   Collection Time    08/27/13  4:35 PM      Result Value Range Status   Specimen Description BRONCHIAL ALVEOLAR LAVAGE   Final   Special Requests Normal   Final   Gram Stain     Final   Value: NO WBC SEEN     NO SQUAMOUS EPITHELIAL CELLS SEEN     NO ORGANISMS SEEN     Performed at [facility]   Culture     Final   Value: RARE YEAST CONSISTENT WITH CANDIDA SPECIES     Performed at [facility]   Report Status 08/30/2013 FINAL   Final  PNEUMOCYSTIS JIROVECI SMEAR  BY DFA     Status: None   Collection Time    08/27/13  4:35 PM      Result Value Range Status   Specimen Source-PJSRC BRONCHIAL ALVEOLAR LAVAGE   Final   Pneumocystis jiroveci Ag NEGATIVE   Final   Comment: Performed at [facility]  RESPIRATORY VIRUS PANEL     Status: None   Collection Time    08/27/13  4:50 PM      Result Value Range Status   Source - RVPAN NASOPHARYNGEAL   Final   Respiratory Syncytial Virus A NOT DETECTED   Final   Respiratory Syncytial Virus B NOT DETECTED   Final   Influenza A NOT DETECTED   Final   Influenza B NOT DETECTED   Final   Parainfluenza 1 NOT DETECTED   Final   Parainfluenza 2 NOT DETECTED   Final   Parainfluenza 3 NOT DETECTED   Final   Metapneumovirus NOT DETECTED   Final   Rhinovirus NOT DETECTED   Final   Adenovirus NOT DETECTED   Final   Influenza A H1 NOT DETECTED   Final   Influenza A H3 NOT DETECTED   Final   Comment: (NOTE)           Normal Reference Range for each Analyte: NOT DETECTED     Testing performed using the Luminex xTAG Respiratory Viral Panel test     kit.     This test was developed and its performance characteristics determined     by [facility]. It has not been cleared or approved by the [place]     Food and Drug Administration. This test is used for clinical purposes.     It should not be regarded as investigational or for research. This     laboratory is certified under the Clinical Laboratory Improvement     Amendments of 1988 (CLIA) as qualified to perform high complexity     clinical laboratory testing.     Performed at [facility]  CULTURE, RESPIRATORY (NON-EXPECTORATED)     Status: None   Collection Time    08/28/13  8:19 AM      Result Value Range Status   Specimen Description TRACHEAL ASPIRATE   Final   Special Requests NONE   Final   Gram Stain     Final   Value: ABUNDANT WBC PRESENT, PREDOMINANTLY PMN     NO SQUAMOUS EPITHELIAL CELLS SEEN     RARE GRAM POSITIVE COCCI     IN  PAIRS RARE  YEAST     Performed at [facility]   Culture     Final   Value: FEW CANDIDA ALBICANS     Performed at [facility]   Report Status 08/30/2013 FINAL   Final  AFB CULTURE WITH SMEAR     Status: None   Collection Time    08/28/13  8:20 AM      Result Value Range Status   Specimen Description TRACHEAL ASPIRATE   Final   Special Requests NONE   Final   ACID FAST SMEAR     Final   Value: NO ACID FAST BACILLI SEEN     Performed at [facility]   Culture     Final   Value: CULTURE WILL BE EXAMINED FOR 6 WEEKS BEFORE ISSUING A FINAL REPORT     Performed at [facility]   Report Status PENDING   Incomplete  FUNGUS CULTURE W SMEAR     Status: None   Collection Time    08/28/13  8:21 AM      Result Value Range Status   Specimen Description TRACHEAL ASPIRATE   Final   Special Requests NONE   Final   Fungal Smear     Final   Value: RARE YEAST WITH PSEUDOHYPHAE     Performed at [facility]   Culture     Final   Value: CANDIDA ALBICANS     Performed at [facility]   Report Status PENDING   Incomplete  RESPIRATORY VIRUS PANEL     Status: None   Collection Time    08/30/13  9:50 PM      Result Value Range Status   Source - RVPAN NOSE   Final   Respiratory Syncytial Virus A NOT DETECTED   Final   Respiratory Syncytial Virus B NOT DETECTED   Final   Influenza A NOT DETECTED   Final   Influenza B NOT DETECTED   Final   Parainfluenza 1 NOT DETECTED   Final   Parainfluenza 2 NOT DETECTED   Final   Parainfluenza 3 NOT DETECTED   Final   Metapneumovirus NOT DETECTED   Final   Rhinovirus NOT DETECTED   Final   Adenovirus NOT DETECTED   Final   Influenza A H1 NOT DETECTED   Final   Influenza A H3 NOT DETECTED   Final   Comment: (NOTE)           Normal Reference Range for each Analyte: NOT DETECTED     Testing performed using the Luminex xTAG Respiratory Viral Panel test     kit.     This test was developed and its performance  characteristics determined     by [facility]. It has not been cleared or approved by the [place]     Food and Drug Administration. This test is used for clinical purposes.     It should not be regarded as investigational or for research. This     laboratory is certified under the [place] (CLIA) as qualified to perform high complexity     clinical laboratory testing.     Performed at [facility]: Basic Metabolic Panel:  Recent Labs Lab 08/28/13 0530  08/30/13 0535 08/31/13 0515 09/01/13 0700 09/02/13 0415 09/03/13 0610  NA 141  < > 140 139 136* 138 135*  K 4.0  < > 4.0 3.6* 4.2  4.1 4.4  CL 108  < > 110 105 102 101 96  CO2 25  < > $R'25 28 28 31 'Id$ 33*  GLUCOSE 87  < > 99 116* 89 121* 116*  BUN 19  < > $R'18 17 14 16 16  'gf$ CREATININE 0.67  < > 0.56 0.57 0.50 0.72 0.61  CALCIUM 7.3*  < > 7.5* 7.6* 7.8* 8.4 9.3  MG 2.5  --   --   --   --   --  2.1  PHOS 2.7  --   --   --   --   --  4.7*  < > = values in this interval not displayed. Liver Function Tests: No results found for this basename: AST, ALT, ALKPHOS, BILITOT, PROT, ALBUMIN,  in the last 168 hours No results found for this basename: LIPASE, AMYLASE,  in the last 168 hours No results found for this basename: AMMONIA,  in the last 168 hours CBC:  Recent Labs Lab 08/29/13 0446 08/29/13 1730 08/30/13 0535 08/31/13 0515 09/01/13 0700 09/02/13 0415 09/03/13 0610  WBC 4.2 4.2 4.7 4.3 4.3 4.6 5.8  NEUTROABS 1.3* 1.2*  --  0.9*  --   --   --   HGB 6.5* 7.0* 8.1* 8.2* 8.4* 8.3* 9.0*  HCT 19.9* 21.6* 24.6* 25.5* 25.1* 25.6* 27.4*  MCV 94.3 95.2 93.5 93.8 92.3 93.1 93.2  PLT 31* 34* 35* 34* PLATELET CLUMPS NOTED ON SMEAR, COUNT APPEARS DECREASED 38* 44*   Cardiac Enzymes: No results found for this basename: CKTOTAL, CKMB, CKMBINDEX, TROPONINI,  in the last 168 hours BNP: No components found with this basename: POCBNP,  CBG:  Recent Labs Lab 09/02/13 1210  09/02/13 1650 09/02/13 2128 09/03/13 0741 09/03/13 1150  GLUCAP 149* 126* 135* 107* 129*    Time coordinating discharge:  Greater than 30 minutes  Signed:  Laroy Mustard, DO [facility] Pager: [phone] 09/03/2013, 2:07 PM

## 2013-09-02 NOTE — Progress Notes (Signed)
Robert Berger   DOB:Dec 03, 1940   NO#:[id number]   QHU#:[id number]  Subjective: Nurse Maudie Mercury reports no acute overnight events.  Patient states pain is a bit better.  Family at bedside.   Objective:  Filed Vitals:   09/02/13 0621  BP: 150/66  Pulse: 91  Temp: 98.3 F (36.8 C)  Resp: 20    Body mass index is 29.31 kg/(m^2).  Intake/Output Summary (Last 24 hours) at 09/02/13 1416 Last data filed at 09/02/13 1224  Gross per 24 hour  Intake    770 ml  Output   1575 ml  Net   -805 ml    Non-toxic appearing, laying in bed.   Sclerae unicteric  Oropharynx clear  Lungs clear -- no rales or rhonchi  Heart regular rate and rhythm  Abdomen benign, + BS   MSK trace peripheral edema  Neuro nonfocal    CBG (last 3)   Recent Labs  09/01/13 2144 09/02/13 0743 09/02/13 1210  GLUCAP 107* 91 149*   Labs:  Lab Results  Component Value Date   WBC 4.6 09/02/2013   HGB 8.3* 09/02/2013   HCT 25.6* 09/02/2013   MCV 93.1 09/02/2013   PLT 38* 09/02/2013   NEUTROABS 0.9* 4/65/6812   Basic Metabolic Panel:  Recent Labs Lab 08/28/13 0530 08/29/13 0446 08/30/13 0535 08/31/13 0515 09/01/13 0700 09/02/13 0415  NA 141 139 140 139 136* 138  K 4.0 3.1* 4.0 3.6* 4.2 4.1  CL 108 110 110 105 102 101  CO2 $Re'25 23 25 28 28 31  'pPX$ GLUCOSE 87 108* 99 116* 89 121*  BUN 19 24* $Remo'18 17 14 16  'AHihq$ CREATININE 0.67 0.56 0.56 0.57 0.50 0.72  CALCIUM 7.3* 6.4* 7.5* 7.6* 7.8* 8.4  MG 2.5  --   --   --   --   --   PHOS 2.7  --   --   --   --   --    CBC:  Recent Labs Lab 08/29/13 0446 08/29/13 1730 08/30/13 0535 08/31/13 0515 09/01/13 0700 09/02/13 0415  WBC 4.2 4.2 4.7 4.3 4.3 4.6  NEUTROABS 1.3* 1.2*  --  0.9*  --   --   HGB 6.5* 7.0* 8.1* 8.2* 8.4* 8.3*  HCT 19.9* 21.6* 24.6* 25.5* 25.1* 25.6*  MCV 94.3 95.2 93.5 93.8 92.3 93.1  PLT 31* 34* 35* 34* PLATELET CLUMPS NOTED ON SMEAR, COUNT APPEARS DECREASED 38*   CBG:  Recent Labs Lab 09/01/13 1242 09/01/13 1756 09/01/13 2144 09/02/13 0743  09/02/13 1210  GLUCAP 129* 91 107* 91 149*   Microbiology Recent Results (from the past 240 hour(s))  CULTURE, BLOOD (ROUTINE X 2)     Status: None   Collection Time    08/25/13  9:40 PM      Result Value Range Status   Specimen Description BLOOD RIGHT ARM   Final   Special Requests BOTTLES DRAWN AEROBIC AND ANAEROBIC 8CC   Final   Culture  Setup Time     Final   Value: 08/26/2013 04:52     Performed at [facility]   Culture     Final   Value: NO GROWTH 5 DAYS     Performed at [facility]   Report Status 09/01/2013 FINAL   Final  CULTURE, BLOOD (ROUTINE X 2)     Status: None   Collection Time    08/25/13  9:40 PM      Result Value Range Status   Specimen Description BLOOD LEFT ARM  Final   Special Requests BOTTLES DRAWN AEROBIC AND ANAEROBIC 8CC   Final   Culture  Setup Time     Final   Value: 08/26/2013 04:52     Performed at [facility]   Culture     Final   Value: NO GROWTH 5 DAYS     Performed at [facility]   Report Status 09/01/2013 FINAL   Final  CULTURE, RESPIRATORY (NON-EXPECTORATED)     Status: None   Collection Time    08/27/13  4:35 PM      Result Value Range Status   Specimen Description BRONCHIAL ALVEOLAR LAVAGE   Final   Special Requests Normal   Final   Gram Stain     Final   Value: NO WBC SEEN     NO SQUAMOUS EPITHELIAL CELLS SEEN     NO ORGANISMS SEEN     Performed at [facility]   Culture     Final   Value: RARE YEAST CONSISTENT WITH CANDIDA SPECIES     Performed at [facility]   Report Status 08/30/2013 FINAL   Final  PNEUMOCYSTIS JIROVECI SMEAR BY DFA     Status: None   Collection Time    08/27/13  4:35 PM      Result Value Range Status   Specimen Source-PJSRC BRONCHIAL ALVEOLAR LAVAGE   Final   Pneumocystis jiroveci Ag NEGATIVE   Final   Comment: Performed at [place]  RESPIRATORY VIRUS PANEL     Status: None   Collection Time    08/27/13  4:50 PM      Result Value  Range Status   Source - RVPAN NASOPHARYNGEAL   Final   Respiratory Syncytial Virus A NOT DETECTED   Final   Respiratory Syncytial Virus B NOT DETECTED   Final   Influenza A NOT DETECTED   Final   Influenza B NOT DETECTED   Final   Parainfluenza 1 NOT DETECTED   Final   Parainfluenza 2 NOT DETECTED   Final   Parainfluenza 3 NOT DETECTED   Final   Metapneumovirus NOT DETECTED   Final   Rhinovirus NOT DETECTED   Final   Adenovirus NOT DETECTED   Final   Influenza A H1 NOT DETECTED   Final   Influenza A H3 NOT DETECTED   Final   Comment: (NOTE)           Normal Reference Range for each Analyte: NOT DETECTED     Testing performed using the Luminex xTAG Respiratory Viral Panel test     kit.     This test was developed and its performance characteristics determined     by [facility]. It has not been cleared or approved by the [place]     Food and Drug Administration. This test is used for clinical purposes.     It should not be regarded as investigational or for research. This     laboratory is certified under the [place] (CLIA) as qualified to perform high complexity     clinical laboratory testing.     Performed at [place], RESPIRATORY (NON-EXPECTORATED)     Status: None   Collection Time    08/28/13  8:19 AM      Result Value Range Status   Specimen Description TRACHEAL ASPIRATE   Final   Special Requests NONE   Final  Gram Stain     Final   Value: ABUNDANT WBC PRESENT, PREDOMINANTLY PMN     NO SQUAMOUS EPITHELIAL CELLS SEEN     RARE GRAM POSITIVE COCCI     IN PAIRS RARE YEAST     Performed at [facility]   Culture     Final   Value: FEW CANDIDA ALBICANS     Performed at [facility]   Report Status 08/30/2013 FINAL   Final  AFB CULTURE WITH SMEAR     Status: None   Collection Time    08/28/13  8:20 AM      Result Value Range Status   Specimen Description TRACHEAL ASPIRATE   Final    Special Requests NONE   Final   ACID FAST SMEAR     Final   Value: NO ACID FAST BACILLI SEEN     Performed at [facility]   Culture     Final   Value: CULTURE WILL BE EXAMINED FOR 6 WEEKS BEFORE ISSUING A FINAL REPORT     Performed at [facility]   Report Status PENDING   Incomplete  FUNGUS CULTURE W SMEAR     Status: None   Collection Time    08/28/13  8:21 AM      Result Value Range Status   Specimen Description TRACHEAL ASPIRATE   Final   Special Requests NONE   Final   Fungal Smear     Final   Value: RARE YEAST WITH PSEUDOHYPHAE     Performed at [facility]   Culture     Final   Value: CANDIDA ALBICANS     Performed at [facility]   Report Status PENDING   Incomplete  RESPIRATORY VIRUS PANEL     Status: None   Collection Time    08/30/13  9:50 PM      Result Value Range Status   Source - RVPAN NOSE   Final   Respiratory Syncytial Virus A NOT DETECTED   Final   Respiratory Syncytial Virus B NOT DETECTED   Final   Influenza A NOT DETECTED   Final   Influenza B NOT DETECTED   Final   Parainfluenza 1 NOT DETECTED   Final   Parainfluenza 2 NOT DETECTED   Final   Parainfluenza 3 NOT DETECTED   Final   Metapneumovirus NOT DETECTED   Final   Rhinovirus NOT DETECTED   Final   Adenovirus NOT DETECTED   Final   Influenza A H1 NOT DETECTED   Final   Influenza A H3 NOT DETECTED   Final   Comment: (NOTE)           Normal Reference Range for each Analyte: NOT DETECTED     Testing performed using the Luminex xTAG Respiratory Viral Panel test     kit.     This test was developed and its performance characteristics determined     by [facility]. It has not been cleared or approved by the [place]     Food and Drug Administration. This test is used for clinical purposes.     It should not be regarded as investigational or for research. This     laboratory is certified under the [place] (CLIA) as  qualified to perform high complexity     clinical laboratory testing.     Performed at [facility]    Studies:  Dg Lumbar Spine 2-3 Views  09/01/2013   CLINICAL DATA:  Low back pain.  Multiple myeloma.  EXAM: LUMBAR SPINE - 2-3 VIEW  COMPARISON:  Lumbar spine MRI on 07/07/2013  FINDINGS: No evidence of acute lumbar spine fracture or subluxation. 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 Previous vertebroplasty also noted at L3. Mild degenerative disc disease is seen at all lumbar levels. No new lytic or destructive bone lesions visualized.  IMPRESSION: No acute findings.  Mild multilevel degenerative disc disease.  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   Electronically Signed   By: Earle Gell M.D.   On: 09/01/2013 15:12   Dg Chest Port 1 View  09/02/2013   CLINICAL DATA:  Acute respiratory failure. Pneumonia. Pulmonary edema. Multiple myeloma.  EXAM: PORTABLE CHEST - 1 VIEW  COMPARISON:  08/31/2013  FINDINGS: Decrease and bilateral airspace disease is seen compared to previous study. Low lung volumes are again noted. No evidence of pleural effusion. Heart size is normal. Right-sided power port remains in appropriate position.  IMPRESSION: Continued improvement in bilateral airspace disease.   Electronically Signed   By: Earle Gell M.D.   On: 09/02/2013 07:28    Assessment/Plan: 73 y.o.  #1. Myeloma/pancytopenia  -Last treated w/ Carfilzomib on 1/16 and cytoxan on 1/15   #2. HCAP complicated by ARF --Continue vancomycin and aztreonam per primary team -- S/p extubation 01/21 --He was worried about being sent home on oxygen.   #3. Thrombocytopenia/ Anemia secondary #1. -- Transfuse pRBC conservately during acute illness and based on symptoms.  Hope to maintain a hemoglobin greater than 7.  Transfuse plts based on less than 10,000 or presence of  bleeding.   #4. Chronic back pain.  --Fentanyl 50 mcg (increased on 01/23) and dilaudid 2 mg every 2 hours prn.  Aggressive stool regiment. Pain ok this afternoon.   Luisalberto Beegle, MD 09/02/2013  2:16 PM

## 2013-09-02 NOTE — Progress Notes (Signed)
[facility] PROGRESS NOTE  JADIEN LEHIGH RXV:[id number] DOB: 10/18/1940 DOA: 08/25/2013 PCP: Nance Pear., NP  Assessment/Plan: Acute respiratory failure  -Due to HCAP and CHF -Extubated 08/29/2013  -Continue incentives spirometry  -proBNP 437  -1/18 echo > LVEF 60-65%, LAE  -Influenza PCR negative  -Repeat CXR 08/31/13--some improvement in b/l infiltrates  -suspect degree of fluid overload as pt is clinically improving with neg fluid balance--repeat lasix IV  -08/26/13 CTA chest negative for PE  HCAP  -Continue vancomycin and aztreonam--continue abx through 09/01/13  -all BAL cultures nondiagnostic--only shows Candida sp.  -blood cultures neg  -d/c antibiotics--has finished 7 full days Acute Diastolic CHF -12/16/30 Echo--EF 60-65% -rapid improvement clinically and rapid improvement of CXR with diuresis suggests pulmonary edema -appears more euvolemic now -negative 5.9kg, neg 5.5L -switch to po lasix -metoprolol started Myeloma/pancytopenia  -Last treated w/ Carfilzomib on 1/16 and cytoxan on 1/15  -Ennever following  -History of PE/DVT--complete a course of Coumadin  -Received 1 unit PRBC 08/29/2013  Chronic back pain  -Patient experienced worsening-->improving slowly with fentanyl 47mcg and dilaudid $RemoveBefo'2mg'yPbsrxrbtgT$  every 2 hours  -MRI ordered by Dr. Filomena Jungling could not tolerate  -increased fentanyl 08/31/13 to 66mcg -add TLSO brace  -PT eval  -When allowed to elaborate on his opioid needs, the patient has some opioid seeking behavior  Family Communication: wife at beside  Disposition Plan: Home 09/03/13 if stable Antibiotics:  Vancomycin 08/25/2013>>> 09/02/13 Aztreonam 08/25/2013>>>09/02/13          Procedures/Studies: Dg Chest 2 View  08/25/2013   CLINICAL DATA:  Hypoxia, dyspnea, and chest discomfort with history of multiple myeloma and pulmonary embolism. Marland Kitchen  EXAM: CHEST  2 VIEW  COMPARISON:  CT scan of the chest dated August 20, 2013.  FINDINGS: Since  the previous study there have developed fluffy alveolar infiltrates bilaterally. The cardiopericardial silhouette does not appear enlarged. There is no pleural effusion. The mediastinum is normal in width. The Port-A-Cath appliance is unchanged in appearance. The patient has undergone kyphoplasty at the T10 through L1 levels.  IMPRESSION: 1. New fluffy alveolar densities in both lungs are worrisome for pneumonia. CHF is not problematic Lee excluded. 2. There is no evidence of a pleural effusion. 3. The patient has known paravertebral soft tissue masses consistent with myeloma. These are not clearly evident on today's study.   Electronically Signed   By: Minnie Legros  [place]   On: 08/25/2013 13:26   Dg Lumbar Spine 2-3 Views  09/01/2013   CLINICAL DATA:  Low back pain.  Multiple myeloma.  EXAM: LUMBAR SPINE - 2-3 VIEW  COMPARISON:  Lumbar spine MRI on 07/07/2013  FINDINGS: No evidence of acute lumbar spine fracture or subluxation. 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 Previous vertebroplasty also noted at L3. Mild degenerative disc disease is seen at all lumbar levels. No new lytic or destructive bone lesions visualized.  IMPRESSION: No acute findings.  Mild multilevel degenerative disc disease.  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   Electronically Signed   By: Earle Gell M.D.   On: 09/01/2013 15:12   Ct Angio Chest Pe W/cm &/or Wo Cm  08/26/2013   CLINICAL DATA:  Unexplained persistent dyspnea and tachycardia, history of multiple myeloma and previous history of pulmonary embolism.  EXAM: CT ANGIOGRAPHY CHEST WITH CONTRAST  TECHNIQUE: Multidetector CT imaging of the chest was performed using the standard protocol during bolus administration of intravenous contrast. Multiplanar  CT image reconstructions including MIPs were obtained to evaluate the vascular anatomy.  CONTRAST:   OMNIPAQUE IOHEXOL 350 MG/ML SOLN  COMPARISON:  PA and lateral chest x-ray of February 22, 2014 and CT scan of the chest dated August 20, 2013  FINDINGS: Contrast within the pulmonary arterial tree is normal in appearance. There are no filling defects to suggest an acute pulmonary embolism. Review of the MIP images confirms the above findings. 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 The caliber of the thoracic aorta is normal. Since the previous study small bilateral pleural effusions have developed. There is no pericardial effusion. The cardiac chambers are normal in size. There is no bulky mediastinal nor hilar lymphadenopathy. There is chronic enlargement of the left thyroid lobe. Again demonstrated is abnormal soft tissue density in the left paravertebral region at approximately T8 and T9.  At lung window settings there has been interval development of fluffy partially confluent bilateral alveolar infiltrates. The upper lobes appear to be the most conspicuously involved.  Known skeletal involvement by the patient's multiple myeloma is again demonstrated.  Within the upper abdomen the observed portions of the liver appear normal. There are tiny radiodensities associated with the dependent portion of the gallbladder near the neck. The spleen is subjectively enlarged.  IMPRESSION: 1. There are new fluffy bilateral widespread pulmonary alveolar infiltrates consistent with pneumonia or pneumonitis. 2. There are new small bilateral pleural effusions layering posteriorly. 3. Stable findings of known skeletal and paravertebral involvement by the patient's known myeloma are demonstrated. 4. There is no evidence of an acute pulmonary embolism.   Electronically Signed   By: Dejon Jungman  [place]   On: 08/26/2013 17:07   Ct Angio Chest Pe W/cm &/or Wo Cm  08/20/2013   CLINICAL DATA:  Shortness of breath and fatigue. History of DVT. Multiple myeloma.  EXAM: CT ANGIOGRAPHY CHEST WITH CONTRAST  TECHNIQUE: Multidetector CT imaging of  the chest was performed using the standard protocol during bolus administration of intravenous contrast. Multiplanar CT image reconstructions including MIPs were obtained to evaluate the vascular anatomy.  CONTRAST:  OMNIPAQUE IOHEXOL 350 MG/ML SOLN  COMPARISON:  05/13/2012  FINDINGS: THORACIC INLET/BODY WALL:  Enlargement of the left lobe thyroid gland with coarse calcifications, stable from 2013.  MEDIASTINUM:  Normal heart size. No pericardial effusion. Multi focal coronary artery atherosclerosis. No acute vascular abnormality, including pulmonary embolism or aortic dissection. No adenopathy.  LUNG WINDOWS:  No consolidation.  No effusion.  No suspicious pulmonary nodule.  UPPER ABDOMEN:  No acute findings.  OSSEOUS:  There are scattered lytic lesions that the imaged skeleton, consistent with myeloma. No acute fracture appreciated. The most notable findings in the thoracic spine is extensive osteolysis of the T9, T10, and T11 bodies, encompassing previously placed bone cement. There is increasing paravertebral soft tissue mass, bilaterally T9-10 and on the left at T8. The nodule at the level of T8 measures 18 mm. Although anemic, the hemo below levels are not as low as would be expected for extramedullary hematopoiesis. Additionally, the paraspinal abnormalities are limited to levels with definite myeloma this involvement of the bone. Question new growth into the posterior canal at the level of T10. No acute fracture appreciated. Other notable lytic areas within the manubrium and T3 body.  Review of the MIP images confirms the above findings.  IMPRESSION: 1. Negative for pulmonary embolism or other acute intrathoracic abnormality. 2. Multiple myeloma. Compared to thoracic spine MRI 07/27/2013, new/larger paravertebral extension at the level of T8 and T9/10.  If there are new thoracic cord symptoms, MRI could re-evaluate the spinal canal at T10.   Electronically Signed   By: Jorje Guild M.D.   On:  08/20/2013 03:41   Ir Fluoro Guide Cv Line Right  08/21/2013   INDICATION: History of multiple myeloma, in need of intravenous access for the initiation of chemotherapy.  EXAM: IMPLANTED PORT A CATH PLACEMENT WITH ULTRASOUND AND FLUOROSCOPIC GUIDANCE  MEDICATIONS: Vancomycin 1 g IV; IV antibiotic was given in an appropriate time interval prior to skin puncture.  ANESTHESIA/SEDATION: Versed 3 mg IV; Fentanyl 100 mcg IV;  Total Moderate Sedation Time  30  minutes.  CONTRAST:  None  COMPARISON:  None.  FLUOROSCOPY TIME:  48 seconds.  PROCEDURE: The procedure, risks, benefits, and alternatives were explained to the patient. Questions regarding the procedure were encouraged and answered. The patient understands and consents to the procedure.  The right neck and chest were prepped with chlorhexidine in a sterile fashion, and a sterile drape was applied covering the operative field. Maximum barrier sterile technique with sterile gowns and gloves were used for the procedure. A timeout was performed prior to the initiation of the procedure. Local anesthesia was provided with 1% lidocaine with epinephrine.  After creating a small venotomy incision, a micropuncture kit was utilized to access the internal jugular vein under direct, real-time ultrasound guidance. Ultrasound image documentation was performed. The microwire was kinked to measure appropriate catheter length.  A subcutaneous port pocket was then created along the upper chest wall utilizing a combination of sharp and blunt dissection. The pocket was irrigated with sterile saline. A single lumen ISP power injectable port was chosen for placement. The 8 Fr catheter was tunneled from the port pocket site to the venotomy incision. The port was placed in the pocket. The external catheter was trimmed to appropriate length. At the venotomy, an 8 Fr peel-away sheath was placed over a guidewire under fluoroscopic guidance. The catheter was then placed through the sheath  and the sheath was removed. Final catheter positioning was confirmed and documented with a fluoroscopic spot radiograph. The port was accessed with a Huber needle, aspirated and flushed with heparinized saline.  The venotomy site was closed with an interrupted 4-0 Vicryl suture. The port pocket incision was closed with interrupted 2-0 Vicryl suture and the skin was opposed with a running subcuticular 4-0 Vicryl suture. Dermabond and Steri-strips were applied to both incisions. Dressings were placed. The patient tolerated the procedure well without immediate post procedural complication.  COMPLICATIONS: None immediate  FINDINGS: After catheter placement, the tip lies within the superior cavoatrial junction. The catheter aspirates and flushes normally and is ready for immediate use.  IMPRESSION: Successful placement of a right internal jugular approach power injectable Port-A-Cath. The catheter is ready for immediate use.   Electronically Signed   By: Sandi Mariscal M.D.   On: 08/21/2013 16:05   Ir US Guide Vasc Access Right  08/21/2013   INDICATION: History of multiple myeloma, in need of intravenous access for the initiation of chemotherapy.  EXAM: IMPLANTED PORT A CATH PLACEMENT WITH ULTRASOUND AND FLUOROSCOPIC GUIDANCE  MEDICATIONS: Vancomycin 1 g IV; IV antibiotic was given in an appropriate time interval prior to skin puncture.  ANESTHESIA/SEDATION: Versed 3 mg IV; Fentanyl 100 mcg IV;  Total Moderate Sedation Time  30  minutes.  CONTRAST:  None  COMPARISON:  None.  FLUOROSCOPY TIME:  48 seconds.  PROCEDURE: The procedure, risks, benefits, and alternatives were explained to the patient. Questions regarding  the procedure were encouraged and answered. The patient understands and consents to the procedure.  The right neck and chest were prepped with chlorhexidine in a sterile fashion, and a sterile drape was applied covering the operative field. Maximum barrier sterile technique with sterile gowns and gloves were  used for the procedure. A timeout was performed prior to the initiation of the procedure. Local anesthesia was provided with 1% lidocaine with epinephrine.  After creating a small venotomy incision, a micropuncture kit was utilized to access the internal jugular vein under direct, real-time ultrasound guidance. Ultrasound image documentation was performed. The microwire was kinked to measure appropriate catheter length.  A subcutaneous port pocket was then created along the upper chest wall utilizing a combination of sharp and blunt dissection. The pocket was irrigated with sterile saline. A single lumen ISP power injectable port was chosen for placement. The 8 Fr catheter was tunneled from the port pocket site to the venotomy incision. The port was placed in the pocket. The external catheter was trimmed to appropriate length. At the venotomy, an 8 Fr peel-away sheath was placed over a guidewire under fluoroscopic guidance. The catheter was then placed through the sheath and the sheath was removed. Final catheter positioning was confirmed and documented with a fluoroscopic spot radiograph. The port was accessed with a Huber needle, aspirated and flushed with heparinized saline.  The venotomy site was closed with an interrupted 4-0 Vicryl suture. The port pocket incision was closed with interrupted 2-0 Vicryl suture and the skin was opposed with a running subcuticular 4-0 Vicryl suture. Dermabond and Steri-strips were applied to both incisions. Dressings were placed. The patient tolerated the procedure well without immediate post procedural complication.  COMPLICATIONS: None immediate  FINDINGS: After catheter placement, the tip lies within the superior cavoatrial junction. The catheter aspirates and flushes normally and is ready for immediate use.  IMPRESSION: Successful placement of a right internal jugular approach power injectable Port-A-Cath. The catheter is ready for immediate use.   Electronically Signed   By:  Sandi Mariscal M.D.   On: 08/21/2013 16:05   Dg Chest Port 1 View  09/02/2013   CLINICAL DATA:  Acute respiratory failure. Pneumonia. Pulmonary edema. Multiple myeloma.  EXAM: PORTABLE CHEST - 1 VIEW  COMPARISON:  08/31/2013  FINDINGS: Decrease and bilateral airspace disease is seen compared to previous study. Low lung volumes are again noted. No evidence of pleural effusion. Heart size is normal. Right-sided power port remains in appropriate position.  IMPRESSION: Continued improvement in bilateral airspace disease.   Electronically Signed   By: Earle Gell M.D.   On: 09/02/2013 07:28   Dg Chest Port 1 View  08/31/2013   CLINICAL DATA:  Pulmonary infiltrates. Cough and shortness of breath.  EXAM: PORTABLE CHEST - 1 VIEW  COMPARISON:  08/29/2013, 08/28/2013, and 08/25/2013 and chest CT dated 08/26/2013  FINDINGS: Power port is in place unchanged. NG tube and endotracheal tube a been removed. There is significant partial clearing of the bilateral pulmonary infiltrates. Heart size and vascularity are normal. No visible effusions.  IMPRESSION: Partial clearing of the extensive bilateral pulmonary infiltrates.   Electronically Signed   By: Rozetta Nunnery M.D.   On: 08/31/2013 13:32   Dg Chest Port 1 View  08/29/2013   CLINICAL DATA:  Respiratory failure.  EXAM: PORTABLE CHEST - 1 VIEW  COMPARISON:  Single view of the chest 08/28/2013 and 08/27/2013.  FINDINGS: Support tubes and lines are unchanged. Extensive bilateral airspace disease persists without marked change.  There is no pneumothorax or pleural effusion. Heart size is normal. Multilevel vertebral augmentation noted.  IMPRESSION: No change in extensive bilateral airspace disease.   Electronically Signed   By: Inge Rise M.D.   On: 08/29/2013 07:31   Dg Chest Port 1 View  08/28/2013   CLINICAL DATA:  Intubation.  EXAM: PORTABLE CHEST - 1 VIEW  COMPARISON:  08/27/2013.  FINDINGS: Endotracheal tube tip noted 2.3 cm above the carina. Central line in  stable position. NG tube tip below the left hemidiaphragm. Bilateral dense pulmonary alveolar infiltrates are present and unchanged. No pleural effusion or pneumothorax. Heart size is normal. No acute osseous abnormality.  IMPRESSION: 1.  Stable line and tube positions.  2. Bilateral dense unchanged pulmonary alveolar infiltrates. This can't be seen with pulmonary edema, ARDS, and /or bilateral pneumonia .   Electronically Signed   By: Marcello Moores  [place]   On: 08/28/2013 07:29   Portable Chest Xray  08/27/2013   CLINICAL DATA:  Acute respiratory failure. On ventilator. Healthcare associated pneumonia. Multiple myeloma.  EXAM: PORTABLE CHEST - 1 VIEW  COMPARISON:  08/25/2013  FINDINGS: Right-sided power port remains in place. New endotracheal tube is seen with tip approximately 2.5 cm above the carina.  Significant interval worsening of diffuse bilateral airspace disease is seen since previous study. Low lung volumes again noted. No evidence of pneumothorax or pleural effusion. Heart size is normal.  IMPRESSION: Endotracheal tube in appropriate position. Significant worsening of diffuse bilateral airspace disease.   Electronically Signed   By: Earle Gell M.D.   On: 08/27/2013 11:58         Subjective: Patient denies fevers, chills, chest pain, nausea, vomiting, diarrhea. He has dyspnea on exertion. No cough or hemoptysis. No abdominal pain. No headache or dizziness.  Objective: Filed Vitals:   09/01/13 1621 09/01/13 1913 09/01/13 2053 09/02/13 0621  BP: 133/74 99/62 129/68 150/66  Pulse: 84 84 107 91  Temp:  98.2 F (36.8 C) 97.5 F (36.4 C) 98.3 F (36.8 C)  TempSrc:  Oral Oral Oral  Resp: $Remo'22 20  20  'vWldx$ Height:      Weight:    84.9 kg (187 lb 2.7 oz)  SpO2: 99% 97% 97% 97%    Intake/Output Summary (Last 24 hours) at 09/02/13 1220 Last data filed at 09/02/13 8115  Gross per 24 hour  Intake    860 ml  Output   2025 ml  Net  -1165 ml   Weight change: -1.3 kg (-2 lb 13.9  oz) Exam:   General:  Pt is alert, follows commands appropriately, not in acute distress  [place]/AT  Cardiovascular: RRR, S1/S2, no rubs, no gallops  Respiratory: CTA bilaterally, no wheezing, no crackles, no rhonchi  Abdomen: Soft/+BS, non tender, non distended, no guarding  Extremities: trace LE edema, No lymphangitis, No petechiae, No rashes, no synovitis  Data Reviewed: Basic Metabolic Panel:  Recent Labs Lab 08/28/13 0530 08/29/13 0446 08/30/13 0535 08/31/13 0515 09/01/13 0700 09/02/13 0415  NA 141 139 140 139 136* 138  K 4.0 3.1* 4.0 3.6* 4.2 4.1  CL 108 110 110 105 102 101  CO2 $Re'25 23 25 28 28 31  'MAv$ GLUCOSE 87 108* 99 116* 89 121*  BUN 19 24* $Remo'18 17 14 16  'zjlPp$ CREATININE 0.67 0.56 0.56 0.57 0.50 0.72  CALCIUM 7.3* 6.4* 7.5* 7.6* 7.8* 8.4  MG 2.5  --   --   --   --   --   PHOS 2.7  --   --   --   --   --  Liver Function Tests: No results found for this basename: AST, ALT, ALKPHOS, BILITOT, PROT, ALBUMIN,  in the last 168 hours No results found for this basename: LIPASE, AMYLASE,  in the last 168 hours No results found for this basename: AMMONIA,  in the last 168 hours CBC:  Recent Labs Lab 08/29/13 0446 08/29/13 1730 08/30/13 0535 08/31/13 0515 09/01/13 0700 09/02/13 0415  WBC 4.2 4.2 4.7 4.3 4.3 4.6  NEUTROABS 1.3* 1.2*  --  0.9*  --   --   HGB 6.5* 7.0* 8.1* 8.2* 8.4* 8.3*  HCT 19.9* 21.6* 24.6* 25.5* 25.1* 25.6*  MCV 94.3 95.2 93.5 93.8 92.3 93.1  PLT 31* 34* 35* 34* PLATELET CLUMPS NOTED ON SMEAR, COUNT APPEARS DECREASED 38*   Cardiac Enzymes: No results found for this basename: CKTOTAL, CKMB, CKMBINDEX, TROPONINI,  in the last 168 hours BNP: No components found with this basename: POCBNP,  CBG:  Recent Labs Lab 09/01/13 1242 09/01/13 1756 09/01/13 2144 09/02/13 0743 09/02/13 1210  GLUCAP 129* 91 107* 91 149*    Recent Results (from the past 240 hour(s))  CULTURE, BLOOD (ROUTINE X 2)     Status: None   Collection Time    08/25/13  9:40 PM       Result Value Range Status   Specimen Description BLOOD RIGHT ARM   Final   Special Requests BOTTLES DRAWN AEROBIC AND ANAEROBIC 8CC   Final   Culture  Setup Time     Final   Value: 08/26/2013 04:52     Performed at [facility]   Culture     Final   Value: NO GROWTH 5 DAYS     Performed at [facility]   Report Status 09/01/2013 FINAL   Final  CULTURE, BLOOD (ROUTINE X 2)     Status: None   Collection Time    08/25/13  9:40 PM      Result Value Range Status   Specimen Description BLOOD LEFT ARM   Final   Special Requests BOTTLES DRAWN AEROBIC AND ANAEROBIC 8CC   Final   Culture  Setup Time     Final   Value: 08/26/2013 04:52     Performed at [facility]   Culture     Final   Value: NO GROWTH 5 DAYS     Performed at [facility]   Report Status 09/01/2013 FINAL   Final  CULTURE, RESPIRATORY (NON-EXPECTORATED)     Status: None   Collection Time    08/27/13  4:35 PM      Result Value Range Status   Specimen Description BRONCHIAL ALVEOLAR LAVAGE   Final   Special Requests Normal   Final   Gram Stain     Final   Value: NO WBC SEEN     NO SQUAMOUS EPITHELIAL CELLS SEEN     NO ORGANISMS SEEN     Performed at [facility]   Culture     Final   Value: RARE YEAST CONSISTENT WITH CANDIDA SPECIES     Performed at [facility]   Report Status 08/30/2013 FINAL   Final  PNEUMOCYSTIS JIROVECI SMEAR BY DFA     Status: None   Collection Time    08/27/13  4:35 PM      Result Value Range Status   Specimen Source-PJSRC BRONCHIAL ALVEOLAR LAVAGE   Final   Pneumocystis jiroveci Ag NEGATIVE   Final   Comment: Performed at [facility]  RESPIRATORY VIRUS PANEL     Status: None   Collection Time    08/27/13  4:50 PM      Result Value Range Status   Source - RVPAN NASOPHARYNGEAL   Final   Respiratory Syncytial Virus A NOT DETECTED   Final   Respiratory Syncytial Virus B NOT DETECTED   Final   Influenza A NOT DETECTED    Final   Influenza B NOT DETECTED   Final   Parainfluenza 1 NOT DETECTED   Final   Parainfluenza 2 NOT DETECTED   Final   Parainfluenza 3 NOT DETECTED   Final   Metapneumovirus NOT DETECTED   Final   Rhinovirus NOT DETECTED   Final   Adenovirus NOT DETECTED   Final   Influenza A H1 NOT DETECTED   Final   Influenza A H3 NOT DETECTED   Final   Comment: (NOTE)           Normal Reference Range for each Analyte: NOT DETECTED     Testing performed using the Luminex xTAG Respiratory Viral Panel test     kit.     This test was developed and its performance characteristics determined     by [facility]. It has not been cleared or approved by the [place]     Food and Drug Administration. This test is used for clinical purposes.     It should not be regarded as investigational or for research. This     laboratory is certified under the [place] (CLIA) as qualified to perform high complexity     clinical laboratory testing.     Performed at [employer] [facility], RESPIRATORY (NON-EXPECTORATED)     Status: None   Collection Time    08/28/13  8:19 AM      Result Value Range Status   Specimen Description TRACHEAL ASPIRATE   Final   Special Requests NONE   Final   Gram Stain     Final   Value: ABUNDANT WBC PRESENT, PREDOMINANTLY PMN     NO SQUAMOUS EPITHELIAL CELLS SEEN     RARE GRAM POSITIVE COCCI     IN PAIRS RARE YEAST     Performed at [facility]   Culture     Final   Value: FEW CANDIDA ALBICANS     Performed at [facility]   Report Status 08/30/2013 FINAL   Final  AFB CULTURE WITH SMEAR     Status: None   Collection Time    08/28/13  8:20 AM      Result Value Range Status   Specimen Description TRACHEAL ASPIRATE   Final   Special Requests NONE   Final   ACID FAST SMEAR     Final   Value: NO ACID FAST BACILLI SEEN     Performed at [facility]   Culture     Final   Value: CULTURE WILL BE EXAMINED  FOR 6 WEEKS BEFORE ISSUING A FINAL REPORT     Performed at [facility]   Report Status PENDING   Incomplete  FUNGUS CULTURE W SMEAR     Status: None   Collection Time    08/28/13  8:21 AM      Result Value Range Status   Specimen Description TRACHEAL ASPIRATE   Final   Special Requests NONE   Final   Fungal Smear     Final   Value:  RARE YEAST WITH PSEUDOHYPHAE     Performed at [facility]   Culture     Final   Value: CANDIDA ALBICANS     Performed at [facility]   Report Status PENDING   Incomplete  RESPIRATORY VIRUS PANEL     Status: None   Collection Time    08/30/13  9:50 PM      Result Value Range Status   Source - RVPAN NOSE   Final   Respiratory Syncytial Virus A NOT DETECTED   Final   Respiratory Syncytial Virus B NOT DETECTED   Final   Influenza A NOT DETECTED   Final   Influenza B NOT DETECTED   Final   Parainfluenza 1 NOT DETECTED   Final   Parainfluenza 2 NOT DETECTED   Final   Parainfluenza 3 NOT DETECTED   Final   Metapneumovirus NOT DETECTED   Final   Rhinovirus NOT DETECTED   Final   Adenovirus NOT DETECTED   Final   Influenza A H1 NOT DETECTED   Final   Influenza A H3 NOT DETECTED   Final   Comment: (NOTE)           Normal Reference Range for each Analyte: NOT DETECTED     Testing performed using the Luminex xTAG Respiratory Viral Panel test     kit.     This test was developed and its performance characteristics determined     by [facility]. It has not been cleared or approved by the [place]     Food and Drug Administration. This test is used for clinical purposes.     It should not be regarded as investigational or for research. This     laboratory is certified under the [place] (CLIA) as qualified to perform high complexity     clinical laboratory testing.     Performed at [facility]     Scheduled Meds: . acyclovir  400 mg Oral Daily  . antiseptic oral rinse  15  mL Mouth Rinse QID  . chlorhexidine  15 mL Mouth Rinse BID  . feeding supplement (ENSURE COMPLETE)  237 mL Oral TID WC  . fentaNYL  50 mcg Transdermal Q72H  . insulin aspart  0-9 Units Subcutaneous TID WC  . metoprolol tartrate  12.5 mg Oral BID  . predniSONE  10 mg Per Tube Q breakfast  . sodium chloride  3 mL Intravenous Q12H   Continuous Infusions: . sodium chloride 10 mL/hr at 09/01/13 0313     Tereasa Yilmaz, DO  [facility] Pager [phone]  If 7PM-7AM, please contact night-coverage [email or web address] Password [email or web address] 09/02/2013, 12:20 PM   LOS: 8 days

## 2013-09-02 NOTE — Progress Notes (Signed)
PT Cancellation Note  Patient Details Name: Robert Berger MRN: [id number] DOB: [birth year]   Cancelled Treatment:    Reason Eval/Treat Not Completed: Medical issues which prohibited therapy (TLSO has not arrived yet) Will check back tomorrow to see if TLSO has arrived to complete evaluation.   Erikah Thumm LUBECK 09/02/2013, 2:27 PM

## 2013-09-03 ENCOUNTER — Ambulatory Visit: Payer: BC Managed Care – PPO | Admitting: Radiation Oncology

## 2013-09-03 ENCOUNTER — Telehealth: Payer: Self-pay | Admitting: Family

## 2013-09-03 LAB — BASIC METABOLIC PANEL
BUN: 16 mg/dL (ref 6–23)
CHLORIDE: 96 meq/L (ref 96–112)
CO2: 33 mEq/L — ABNORMAL HIGH (ref 19–32)
Calcium: 9.3 mg/dL (ref 8.4–10.5)
Creatinine, Ser: 0.61 mg/dL (ref 0.50–1.35)
GFR calc Af Amer: >90 mL/min (ref >90)
Glucose, Bld: 116 mg/dL — ABNORMAL HIGH (ref 70–99)
POTASSIUM: 4.4 meq/L (ref 3.7–5.3)
SODIUM: 135 meq/L — AB (ref 137–147)

## 2013-09-03 LAB — CBC
HEMATOCRIT: 27.4 % — AB (ref 39.0–52.0)
HEMOGLOBIN: 9 g/dL — AB (ref 13.0–17.0)
MCH: 30.6 pg (ref 26.0–34.0)
MCHC: 32.8 g/dL (ref 30.0–36.0)
MCV: 93.2 fL (ref 78.0–100.0)
Platelets: 44 10*3/uL — ABNORMAL LOW (ref 150–400)
RBC: 2.94 MIL/uL — ABNORMAL LOW (ref 4.22–5.81)
RDW: 18.8 % — ABNORMAL HIGH (ref 11.5–15.5)
WBC: 5.8 10*3/uL (ref 4.0–10.5)

## 2013-09-03 LAB — PHOSPHORUS: PHOSPHORUS: 4.7 mg/dL — AB (ref 2.3–4.6)

## 2013-09-03 LAB — GLUCOSE, CAPILLARY
GLUCOSE-CAPILLARY: 135 mg/dL — AB (ref 70–99)
Glucose-Capillary: 107 mg/dL — ABNORMAL HIGH (ref 70–99)
Glucose-Capillary: 129 mg/dL — ABNORMAL HIGH (ref 70–99)

## 2013-09-03 LAB — MAGNESIUM: Magnesium: 2.1 mg/dL (ref 1.5–2.5)

## 2013-09-03 MED ORDER — HYDROMORPHONE HCL 2 MG PO TABS: 4.0000 mg | ORAL_TABLET | ORAL | Status: DC | PRN

## 2013-09-03 MED ORDER — FENTANYL 75 MCG/HR TD PT72: 75.0000 ug | MEDICATED_PATCH | TRANSDERMAL | Status: AC

## 2013-09-03 MED ORDER — FUROSEMIDE 40 MG PO TABS: 40.0000 mg | ORAL_TABLET | Freq: Every day | ORAL | Status: AC

## 2013-09-03 MED ORDER — HYDROMORPHONE HCL PF 2 MG/ML IJ SOLN
3.0000 mg | INTRAMUSCULAR | Status: DC | PRN
  Administered 2013-09-03 (×3): 3 mg via INTRAVENOUS
  Filled 2013-09-03 (×3): qty 2

## 2013-09-03 MED ORDER — FUROSEMIDE 40 MG PO TABS
40.0000 mg | ORAL_TABLET | Freq: Every day | ORAL | Status: DC
  Filled 2013-09-03: qty 1

## 2013-09-03 MED ORDER — FUROSEMIDE 20 MG PO TABS
20.0000 mg | ORAL_TABLET | Freq: Every day | ORAL | Status: DC
Start: 2013-09-03 — End: 2013-09-03

## 2013-09-03 MED ORDER — HEPARIN SOD (PORK) LOCK FLUSH 100 UNIT/ML IV SOLN
500.0000 [IU] | INTRAVENOUS | Status: AC | PRN
  Administered 2013-09-03: 500 [IU]

## 2013-09-03 MED ORDER — METOPROLOL TARTRATE 25 MG PO TABS
25.0000 mg | ORAL_TABLET | Freq: Two times a day (BID) | ORAL | Status: DC
  Filled 2013-09-03: qty 1

## 2013-09-03 MED ORDER — METOPROLOL TARTRATE 25 MG PO TABS: 25.0000 mg | ORAL_TABLET | Freq: Two times a day (BID) | ORAL | Status: AC

## 2013-09-03 MED ORDER — FENTANYL 75 MCG/HR TD PT72
75.0000 ug | MEDICATED_PATCH | TRANSDERMAL | Status: DC
  Administered 2013-09-03: 75 ug via TRANSDERMAL
  Filled 2013-09-03: qty 1

## 2013-09-03 NOTE — Evaluation (Signed)
Physical Therapy Evaluation Patient Details Name: Robert Berger MRN: [id number] DOB: 14-Apr-1941 Today's Date: 09/03/2013 Time: 5400-8676 PT Time Calculation (min): 14 min  PT Assessment / Plan / Recommendation History of Present Illness  73 yo male admitted with Pna, back pain. Hx of multiple myeloma, DVT, PE, KP T10-L1.   Clinical Impression  On eval, pt required Mod assist for mobility-able to ambulate a short distance to bathroom with walker. Pt is deconditioned. Discussed d/c plan-pt states he plans to d/c home. Recommend HHPT, 24/7 care as long as wife can provide current level of assist.    PT Assessment  Patient needs continued PT services    Follow Up Recommendations  Home health PT;Supervision/Assistance - 24 hour (as long as wife can provide current level of care)    Does the patient have the potential to tolerate intense rehabilitation      Barriers to Discharge        Equipment Recommendations  None recommended by PT    Recommendations for Other Services OT consult   Frequency Min 3X/week    Precautions / Restrictions Precautions Precautions: Fall Required Braces or Orthoses:  (no TLSO in room at time of eval.) Restrictions Weight Bearing Restrictions: No   Pertinent Vitals/Pain 7/10 back      Mobility  Bed Mobility Overal bed mobility: Needs Assistance Bed Mobility: Sit to Sidelying Sit to sidelying: Min assist General bed mobility comments: Pt sitting EOB with nurse tech at start of session (about to ambulate to bathroom). Min assist for LEs back onto bed.  Transfers Overall transfer level: Needs assistance Equipment used: Rolling walker (2 wheeled) Transfers: Sit to/from Stand Sit to Stand: Mod assist;From elevated surface General transfer comment: Assist to rise, stabilize, control descent. VCS safety, technique, hand placement Ambulation/Gait Ambulation/Gait assistance: Min assist Ambulation Distance (Feet): 15 Feet (x 2) Assistive device:  Rolling walker (2 wheeled) Gait Pattern/deviations: Step-through pattern General Gait Details: Assisted pt with ambulation to bathroom. O2 line unable to reach-removed O2-sats 87% on RA. dyspnea 3-4/4 with activity. Replaced [place] O2 once closer to bed.     Exercises     PT Diagnosis: Difficulty walking;Generalized weakness  PT Problem List: Decreased strength;Decreased activity tolerance;Decreased balance;Decreased mobility;Decreased knowledge of use of DME PT Treatment Interventions: DME instruction;Gait training;Functional mobility training;Therapeutic activities;Therapeutic exercise;Balance training;Patient/family education     PT Goals(Current goals can be found in the care plan section) Acute Rehab PT Goals Patient Stated Goal: home soon PT Goal Formulation: With patient Time For Goal Achievement: 09/17/13 Potential to Achieve Goals: Fair  Visit Information  Last PT Received On: 09/03/13 Assistance Needed: +2 (safety) History of Present Illness: 73 yo male admitted with Pna, back pain. Hx of multiple myeloma, DVT, PE, KP T10-L1.        Prior [place] expects to be discharged to:: Private residence Living Arrangements: Spouse/significant other Available Help at Discharge: Family Type of Home: House Home Access: Stairs to enter Technical brewer of Steps: 2 Entrance Stairs-Rails: None Home Layout: One level Home Equipment: Environmental consultant - 2 wheels;Wheelchair - manual;Bedside commode;Cane - single point Prior Function Level of Independence: Needs assistance Comments: wife can provide limited assistance.  Communication Communication: No difficulties    Cognition  Cognition Arousal/Alertness: Awake/alert Behavior During Therapy: WFL for tasks assessed/performed Overall Cognitive Status: Within Functional Limits for tasks assessed    Extremity/Trunk Assessment Upper Extremity Assessment Upper Extremity Assessment: Generalized weakness Lower  Extremity Assessment Lower Extremity Assessment: Generalized weakness Cervical / Trunk Assessment Cervical /  Trunk Assessment: Normal   Balance Balance Overall balance assessment: Needs assistance Sitting-balance support: Feet supported;Bilateral upper extremity supported Sitting balance-Leahy Scale: Good Standing balance support: Bilateral upper extremity supported;During functional activity Standing balance-Leahy Scale: Poor  End of Session PT - End of Session Equipment Utilized During Treatment: Gait belt;Oxygen Activity Tolerance: Patient limited by fatigue (Limited by dyspnea) Patient left: in bed;with call bell/phone within reach  GP     Weston Anna, MPT Pager: [phone]

## 2013-09-03 NOTE — Progress Notes (Signed)
Mr. Eggenberger actually looks pretty good.  He is sitting in a chair. He says she just cannot tolerate mattress that he has. I re\re ordered the air overlay mattress. This worked well for him when he was in the hospital with his last admission.  He really wants to go home. He says she will do better at home. I a lot always I can agree with this.  He still is on oxygen. I'm not sure if you'll need this at home.  I will discontinue his prednisone. I don't think he really needs this now.  He has completed IV antibiotics.  There is still a lot of lower back pain. He just does not think that he can do an MRI at [facility]. He needs an open MRI.  I adjusted his fentanyl patch up to 75 mcg. I also got him back on oral Dilaudid.  He is eating okay.  He does have quite a few ecchymoses.  His vital signs look good. Temperature 97.6. Blood pressure 124/79. Pulse is 106. Lungs are clear bilaterally. I don't hear any wheezes or crackles. Cardiac exam is slightly tachycardic but regular. Abdomen is soft. Has good bowel sounds. There is no fluid wave. Extremities shows no edema. Muscle strength is 4+/5 in his legs by laterally. Neurological exam shows no focal neurological deficits.  His labs today show a white cell count 5.8. Hemoglobin 9 and platelet count 44.   Again, he really wants to try to go home today. From my point of view, he could if this can be arranged. We can certainly do the MRI as an outpatient.  We will resume chemotherapy as an outpatient.  He really has made a very nice come back. I'm just thankful for all the great care that he is received.   Pete E.  Hebrews 12:12

## 2013-09-03 NOTE — Care Management Note (Addendum)
    Page 1 of 2   09/03/2013     3:55:54 PM   CARE MANAGEMENT NOTE 09/03/2013  Patient:  Robert Berger, Robert Berger   Account Number:  [id number]  Date Initiated:  08/29/2013  Documentation initiated by:  DAVIS,RHONDA  Subjective/Objective Assessment:   patient transferred to icu/sdu on [id number] from med surg floor due to resp failure, intubated [id number] and extubated to 40%02, [id number]/     Action/Plan:   lives at home with his spouse/will follow for hhc,snf or ltac needs   Anticipated DC Date:  09/03/2013   Anticipated DC Plan:  [place]  In-house referral  NA      DC Planning Services  CM consult      [facility] Choice  NA   Choice offered to / List presented to:  C-1 Patient   DME arranged  [facility]      DME agency  [place] arranged  HH-1 RN  [place] agency  [place]   Status of service:  Completed, signed off Medicare Important Message given?  NA - LOS <3 / Initial given by admissions (If response is "NO", the following Medicare IM given date fields will be blank) Date Medicare IM given:   Date Additional Medicare IM given:    Discharge Disposition:  Robert Berger  Per UR Regulation:  Reviewed for med. necessity/level of care/duration of stay  If discussed at [place] of Stay Meetings, dates discussed:    Comments:  09/03/13 Sumner Kirchman RN,BSN NCM [place].HOME 02 ORDERED.TC [facility] REP KRISTEN AWARE OF HOME 02 ORDER.WILL BE DELIVERED TO PATIENT'S RM.  [place].TC MARY [phone] XBMWU OF REFERRAL,& D/C HHC ORDERS,& D/C.  [id number] Rosana Hoes, RN, BSN, [place] [phone] Chart Reviewed for discharge and hospital needs. Discharge needs at time of review:  None present will follow for needs. Review of patient progress due on [id number].

## 2013-09-03 NOTE — Telephone Encounter (Signed)
Please call pt and arrange a 2 week hospital follow up.

## 2013-09-03 NOTE — Progress Notes (Signed)
Pt's O2 sats were 84% at rest on room air. Callie Fielding RN

## 2013-09-03 NOTE — Progress Notes (Signed)
[facility]  [facility] is providing the following services: Oxygen  If patient discharges after hours, please call [phone].   Linward Headland 09/03/2013, 3:58 PM

## 2013-09-04 NOTE — Telephone Encounter (Signed)
Appointment scheduled for 09/17/13

## 2013-09-05 ENCOUNTER — Ambulatory Visit: Payer: BC Managed Care – PPO

## 2013-09-05 ENCOUNTER — Telehealth: Payer: Self-pay | Admitting: Hematology & Oncology

## 2013-09-05 ENCOUNTER — Other Ambulatory Visit: Payer: BC Managed Care – PPO | Admitting: Lab

## 2013-09-05 NOTE — Telephone Encounter (Signed)
Pt aware of 2-5 and 2-6 appointments

## 2013-09-06 ENCOUNTER — Ambulatory Visit: Payer: BC Managed Care – PPO

## 2013-09-07 ENCOUNTER — Ambulatory Visit (HOSPITAL_BASED_OUTPATIENT_CLINIC_OR_DEPARTMENT_OTHER): Payer: Medicare Other | Admitting: Hematology & Oncology

## 2013-09-07 ENCOUNTER — Inpatient Hospital Stay (HOSPITAL_COMMUNITY): Payer: Medicare Other

## 2013-09-07 ENCOUNTER — Ambulatory Visit (HOSPITAL_BASED_OUTPATIENT_CLINIC_OR_DEPARTMENT_OTHER): Payer: Medicare Other

## 2013-09-07 ENCOUNTER — Encounter: Payer: Self-pay | Admitting: Hematology & Oncology

## 2013-09-07 ENCOUNTER — Ambulatory Visit: Payer: BC Managed Care – PPO | Admitting: Physician Assistant

## 2013-09-07 ENCOUNTER — Encounter (HOSPITAL_COMMUNITY): Payer: Self-pay | Admitting: *Deleted

## 2013-09-07 ENCOUNTER — Inpatient Hospital Stay (HOSPITAL_COMMUNITY)
Admission: AD | Admit: 2013-09-07 | Discharge: 2013-10-07 | DRG: 840 | Disposition: E | Payer: Medicare Other | Source: Ambulatory Visit | Attending: Hematology & Oncology | Admitting: Hematology & Oncology

## 2013-09-07 ENCOUNTER — Other Ambulatory Visit: Payer: Self-pay | Admitting: Hematology & Oncology

## 2013-09-07 ENCOUNTER — Telehealth: Payer: Self-pay | Admitting: Nurse Practitioner

## 2013-09-07 VITALS — BP 112/64 | HR 83 | Temp 98.2°F | Resp 20 | Ht 70.0 in

## 2013-09-07 DIAGNOSIS — G4700 Insomnia, unspecified: Secondary | ICD-10-CM | POA: Diagnosis present

## 2013-09-07 DIAGNOSIS — Q998 Other specified chromosome abnormalities: Secondary | ICD-10-CM

## 2013-09-07 DIAGNOSIS — G893 Neoplasm related pain (acute) (chronic): Secondary | ICD-10-CM

## 2013-09-07 DIAGNOSIS — F411 Generalized anxiety disorder: Secondary | ICD-10-CM | POA: Diagnosis not present

## 2013-09-07 DIAGNOSIS — R918 Other nonspecific abnormal finding of lung field: Secondary | ICD-10-CM | POA: Diagnosis present

## 2013-09-07 DIAGNOSIS — I5031 Acute diastolic (congestive) heart failure: Secondary | ICD-10-CM

## 2013-09-07 DIAGNOSIS — F32A Depression, unspecified: Secondary | ICD-10-CM

## 2013-09-07 DIAGNOSIS — M129 Arthropathy, unspecified: Secondary | ICD-10-CM | POA: Diagnosis present

## 2013-09-07 DIAGNOSIS — K449 Diaphragmatic hernia without obstruction or gangrene: Secondary | ICD-10-CM

## 2013-09-07 DIAGNOSIS — I499 Cardiac arrhythmia, unspecified: Secondary | ICD-10-CM | POA: Diagnosis not present

## 2013-09-07 DIAGNOSIS — Z515 Encounter for palliative care: Secondary | ICD-10-CM

## 2013-09-07 DIAGNOSIS — C9002 Multiple myeloma in relapse: Secondary | ICD-10-CM

## 2013-09-07 DIAGNOSIS — Z880 Allergy status to penicillin: Secondary | ICD-10-CM

## 2013-09-07 DIAGNOSIS — R579 Shock, unspecified: Secondary | ICD-10-CM

## 2013-09-07 DIAGNOSIS — I10 Essential (primary) hypertension: Secondary | ICD-10-CM

## 2013-09-07 DIAGNOSIS — M545 Low back pain, unspecified: Secondary | ICD-10-CM

## 2013-09-07 DIAGNOSIS — E1142 Type 2 diabetes mellitus with diabetic polyneuropathy: Secondary | ICD-10-CM | POA: Diagnosis present

## 2013-09-07 DIAGNOSIS — Z923 Personal history of irradiation: Secondary | ICD-10-CM

## 2013-09-07 DIAGNOSIS — K5900 Constipation, unspecified: Secondary | ICD-10-CM | POA: Diagnosis present

## 2013-09-07 DIAGNOSIS — K209 Esophagitis, unspecified without bleeding: Secondary | ICD-10-CM | POA: Diagnosis present

## 2013-09-07 DIAGNOSIS — E119 Type 2 diabetes mellitus without complications: Secondary | ICD-10-CM

## 2013-09-07 DIAGNOSIS — M549 Dorsalgia, unspecified: Secondary | ICD-10-CM

## 2013-09-07 DIAGNOSIS — R0902 Hypoxemia: Secondary | ICD-10-CM

## 2013-09-07 DIAGNOSIS — G8929 Other chronic pain: Secondary | ICD-10-CM | POA: Diagnosis present

## 2013-09-07 DIAGNOSIS — Z87891 Personal history of nicotine dependence: Secondary | ICD-10-CM

## 2013-09-07 DIAGNOSIS — Z933 Colostomy status: Secondary | ICD-10-CM

## 2013-09-07 DIAGNOSIS — M8440XA Pathological fracture, unspecified site, initial encounter for fracture: Secondary | ICD-10-CM | POA: Diagnosis present

## 2013-09-07 DIAGNOSIS — E875 Hyperkalemia: Secondary | ICD-10-CM | POA: Diagnosis not present

## 2013-09-07 DIAGNOSIS — D61818 Other pancytopenia: Secondary | ICD-10-CM | POA: Diagnosis present

## 2013-09-07 DIAGNOSIS — G959 Disease of spinal cord, unspecified: Secondary | ICD-10-CM | POA: Diagnosis present

## 2013-09-07 DIAGNOSIS — Z86711 Personal history of pulmonary embolism: Secondary | ICD-10-CM

## 2013-09-07 DIAGNOSIS — C9010 Plasma cell leukemia not having achieved remission: Secondary | ICD-10-CM | POA: Diagnosis present

## 2013-09-07 DIAGNOSIS — E872 Acidosis, unspecified: Secondary | ICD-10-CM | POA: Diagnosis not present

## 2013-09-07 DIAGNOSIS — E010 Iodine-deficiency related diffuse (endemic) goiter: Secondary | ICD-10-CM

## 2013-09-07 DIAGNOSIS — E871 Hypo-osmolality and hyponatremia: Secondary | ICD-10-CM

## 2013-09-07 DIAGNOSIS — Z66 Do not resuscitate: Secondary | ICD-10-CM | POA: Diagnosis not present

## 2013-09-07 DIAGNOSIS — Z86718 Personal history of other venous thrombosis and embolism: Secondary | ICD-10-CM

## 2013-09-07 DIAGNOSIS — G9340 Encephalopathy, unspecified: Secondary | ICD-10-CM | POA: Diagnosis present

## 2013-09-07 DIAGNOSIS — K219 Gastro-esophageal reflux disease without esophagitis: Secondary | ICD-10-CM | POA: Diagnosis present

## 2013-09-07 DIAGNOSIS — C9000 Multiple myeloma not having achieved remission: Secondary | ICD-10-CM

## 2013-09-07 DIAGNOSIS — M899 Disorder of bone, unspecified: Secondary | ICD-10-CM

## 2013-09-07 DIAGNOSIS — E1149 Type 2 diabetes mellitus with other diabetic neurological complication: Secondary | ICD-10-CM | POA: Diagnosis present

## 2013-09-07 DIAGNOSIS — R4182 Altered mental status, unspecified: Secondary | ICD-10-CM

## 2013-09-07 DIAGNOSIS — N179 Acute kidney failure, unspecified: Secondary | ICD-10-CM | POA: Diagnosis present

## 2013-09-07 DIAGNOSIS — Z79899 Other long term (current) drug therapy: Secondary | ICD-10-CM

## 2013-09-07 DIAGNOSIS — F329 Major depressive disorder, single episode, unspecified: Secondary | ICD-10-CM

## 2013-09-07 DIAGNOSIS — J9600 Acute respiratory failure, unspecified whether with hypoxia or hypercapnia: Secondary | ICD-10-CM

## 2013-09-07 DIAGNOSIS — I2510 Atherosclerotic heart disease of native coronary artery without angina pectoris: Secondary | ICD-10-CM

## 2013-09-07 DIAGNOSIS — B370 Candidal stomatitis: Secondary | ICD-10-CM | POA: Diagnosis present

## 2013-09-07 DIAGNOSIS — R5381 Other malaise: Secondary | ICD-10-CM

## 2013-09-07 DIAGNOSIS — Z9981 Dependence on supplemental oxygen: Secondary | ICD-10-CM

## 2013-09-07 DIAGNOSIS — Z638 Other specified problems related to primary support group: Secondary | ICD-10-CM

## 2013-09-07 DIAGNOSIS — J189 Pneumonia, unspecified organism: Secondary | ICD-10-CM | POA: Diagnosis present

## 2013-09-07 HISTORY — DX: Unspecified cord compression: G95.20

## 2013-09-07 LAB — COMPREHENSIVE METABOLIC PANEL
ALT: 41 U/L (ref 0–53)
AST: 40 U/L — ABNORMAL HIGH (ref 0–37)
Albumin: 2.5 g/dL — ABNORMAL LOW (ref 3.5–5.2)
Alkaline Phosphatase: 195 U/L — ABNORMAL HIGH (ref 39–117)
BILIRUBIN TOTAL: 0.6 mg/dL (ref 0.3–1.2)
BUN: 15 mg/dL (ref 6–23)
CHLORIDE: 100 meq/L (ref 96–112)
CO2: 28 meq/L (ref 19–32)
CREATININE: 0.74 mg/dL (ref 0.50–1.35)
Calcium: 8.8 mg/dL (ref 8.4–10.5)
GFR calc Af Amer: >90 mL/min (ref >90)
GFR, EST NON AFRICAN AMERICAN: 90 mL/min — AB (ref >90)
Glucose, Bld: 156 mg/dL — ABNORMAL HIGH (ref 70–99)
POTASSIUM: 4.3 meq/L (ref 3.7–5.3)
Sodium: 136 mEq/L — ABNORMAL LOW (ref 137–147)
Total Protein: 9.6 g/dL — ABNORMAL HIGH (ref 6.0–8.3)

## 2013-09-07 LAB — CBC
HEMATOCRIT: 22.1 % — AB (ref 39.0–52.0)
Hemoglobin: 7.2 g/dL — ABNORMAL LOW (ref 13.0–17.0)
MCH: 30.5 pg (ref 26.0–34.0)
MCHC: 32.6 g/dL (ref 30.0–36.0)
MCV: 93.6 fL (ref 78.0–100.0)
PLATELETS: 58 10*3/uL — AB (ref 150–400)
RBC: 2.36 MIL/uL — AB (ref 4.22–5.81)
RDW: 19.8 % — ABNORMAL HIGH (ref 11.5–15.5)
WBC: 13.3 10*3/uL — AB (ref 4.0–10.5)

## 2013-09-07 LAB — PROTIME-INR
INR: 1.08 (ref 0.00–1.49)
PROTHROMBIN TIME: 13.8 s (ref 11.6–15.2)

## 2013-09-07 LAB — MAGNESIUM: Magnesium: 2.1 mg/dL (ref 1.5–2.5)

## 2013-09-07 LAB — APTT: APTT: 28 s (ref 24–37)

## 2013-09-07 MED ORDER — IOHEXOL 300 MG/ML  SOLN
100.0000 mL | Freq: Once | INTRAMUSCULAR | Status: AC | PRN
  Administered 2013-09-07: 100 mL via INTRAVENOUS

## 2013-09-07 MED ORDER — SODIUM CHLORIDE 0.9 % IV SOLN
Freq: Once | INTRAVENOUS | Status: AC
  Administered 2013-09-07: 12:00:00 via INTRAVENOUS

## 2013-09-07 MED ORDER — HYDROMORPHONE HCL PF 1 MG/ML IJ SOLN
2.0000 mg | INTRAMUSCULAR | Status: DC | PRN
  Administered 2013-09-07 – 2013-09-09 (×10): 2 mg via INTRAVENOUS
  Administered 2013-09-09: 3 mg via INTRAVENOUS
  Administered 2013-09-09 – 2013-09-10 (×5): 2 mg via INTRAVENOUS
  Filled 2013-09-07 (×12): qty 2
  Filled 2013-09-07: qty 3
  Filled 2013-09-07 (×2): qty 2

## 2013-09-07 MED ORDER — PREDNISONE 10 MG PO TABS
10.0000 mg | ORAL_TABLET | Freq: Every day | ORAL | Status: DC
  Filled 2013-09-07 (×2): qty 1

## 2013-09-07 MED ORDER — HYDROMORPHONE HCL 2 MG PO TABS
2.0000 mg | ORAL_TABLET | ORAL | Status: DC | PRN
  Administered 2013-09-11 – 2013-09-15 (×4): 2 mg via ORAL
  Filled 2013-09-07 (×4): qty 1

## 2013-09-07 MED ORDER — BACLOFEN 10 MG PO TABS
10.0000 mg | ORAL_TABLET | Freq: Three times a day (TID) | ORAL | Status: DC
  Administered 2013-09-08 – 2013-09-11 (×13): 10 mg via ORAL
  Filled 2013-09-07 (×16): qty 1

## 2013-09-07 MED ORDER — SODIUM CHLORIDE 0.9 % IV SOLN
INTRAVENOUS | Status: DC
Start: 2013-09-07 — End: 2013-09-22
  Administered 2013-09-07 – 2013-09-12 (×5): via INTRAVENOUS
  Administered 2013-09-14: 50 mL/h via INTRAVENOUS
  Administered 2013-09-18: 20 mL/h via INTRAVENOUS
  Administered 2013-09-19: 20 mL via INTRAVENOUS

## 2013-09-07 MED ORDER — VITAMIN B-6 50 MG PO TABS
250.0000 mg | ORAL_TABLET | Freq: Every morning | ORAL | Status: DC
  Administered 2013-09-08 – 2013-09-11 (×4): 250 mg via ORAL
  Filled 2013-09-07 (×5): qty 1

## 2013-09-07 MED ORDER — PANTOPRAZOLE SODIUM 40 MG PO TBEC
40.0000 mg | DELAYED_RELEASE_TABLET | Freq: Every day | ORAL | Status: DC
  Administered 2013-09-08 – 2013-09-17 (×11): 40 mg via ORAL
  Filled 2013-09-07 (×14): qty 1

## 2013-09-07 MED ORDER — PROCHLORPERAZINE MALEATE 10 MG PO TABS
10.0000 mg | ORAL_TABLET | Freq: Four times a day (QID) | ORAL | Status: DC | PRN
  Administered 2013-09-15: 10 mg via ORAL
  Filled 2013-09-07: qty 1

## 2013-09-07 MED ORDER — LORAZEPAM 0.5 MG PO TABS
0.5000 mg | ORAL_TABLET | Freq: Four times a day (QID) | ORAL | Status: DC | PRN
  Administered 2013-09-16: 0.5 mg via ORAL
  Filled 2013-09-07: qty 1

## 2013-09-07 MED ORDER — METOPROLOL TARTRATE 25 MG PO TABS
25.0000 mg | ORAL_TABLET | Freq: Two times a day (BID) | ORAL | Status: DC
  Administered 2013-09-08 – 2013-09-14 (×14): 25 mg via ORAL
  Filled 2013-09-07 (×16): qty 1

## 2013-09-07 MED ORDER — FUROSEMIDE 10 MG/ML IJ SOLN
20.0000 mg | Freq: Once | INTRAMUSCULAR | Status: AC
  Administered 2013-09-08: 20 mg via INTRAVENOUS
  Filled 2013-09-07: qty 2

## 2013-09-07 MED ORDER — HYDROMORPHONE HCL PF 1 MG/ML IJ SOLN
2.0000 mg | INTRAMUSCULAR | Status: DC | PRN
  Filled 2013-09-07: qty 2

## 2013-09-07 MED ORDER — HYDROMORPHONE HCL PF 2 MG/ML IJ SOLN
INTRAMUSCULAR | Status: AC
  Filled 2013-09-07: qty 1

## 2013-09-07 MED ORDER — ACYCLOVIR 400 MG PO TABS
400.0000 mg | ORAL_TABLET | Freq: Every day | ORAL | Status: DC
  Administered 2013-09-08 – 2013-09-16 (×9): 400 mg via ORAL
  Filled 2013-09-07 (×10): qty 1

## 2013-09-07 MED ORDER — FENTANYL 75 MCG/HR TD PT72
75.0000 ug | MEDICATED_PATCH | TRANSDERMAL | Status: DC
  Administered 2013-09-09: 75 ug via TRANSDERMAL
  Filled 2013-09-07: qty 1

## 2013-09-07 MED ORDER — HYDROMORPHONE HCL PF 2 MG/ML IJ SOLN
2.0000 mg | Freq: Once | INTRAMUSCULAR | Status: AC
Start: 2013-09-07 — End: 2013-09-07
  Administered 2013-09-07: 2 mg via INTRAVENOUS

## 2013-09-07 NOTE — H&P (Signed)
#   [id number] is admit note.  Pete E.  Hebrews 12:12

## 2013-09-07 NOTE — Telephone Encounter (Signed)
Pt called stating he was having severe back pain despite the Dilaudid and Fentanyl patches. Per Dr. Marin Olp pt was advised to come into the office to be assessed. He verbalized understanding.

## 2013-09-07 NOTE — Progress Notes (Signed)
Patient admitted to [address] Marin Olp [facility] here to pick patient up and transport to [place].

## 2013-09-07 NOTE — Patient Instructions (Signed)
Pain Medicine Instructions You have been given a prescription for pain medicines. These medicines may affect your ability to think clearly. They may also affect your ability to perform physical activities. Take these medicines only as needed for pain. You do not need to take them if you are not having pain, unless directed by your caregiver. You can take less than the prescribed dose if you find a smaller amount of medicine controls the pain. It may not be possible to make all of your pain go away, but you should be comfortable enough to move, breathe, and take care of yourself. After you start taking pain medicines, while taking the medicines, and for 8 hours after stopping the medicines:  Do not drive.  Do not operate machinery.  Do not operate power tools.  Do not sign legal documents.  Do not supervise children by yourself.  Do not participate in activities that require climbing or being in high places.  Do not enter a body of water (lake, river, ocean, spa, swimming pool) without an adult nearby who can help you. You may have been prescribed a pain medicine that contains acetaminophen (paracetamol). If so, take only the amount directed by your caregiver. Do not take any other acetaminophen while taking this medicine. An overdose of acetaminophen can result in severe liver damage. If you are taking other medicines, check the active ingredients for acetaminophen. Acetaminophen is found in hundreds of over-the-counter and prescription medicines. These include cold relief products, menstrual cramp relief medicines, fever-reducing medicines, acid indigestion relief products, and pain relief products. HOME CARE INSTRUCTIONS   Do not drink alcohol, take sleeping pills, or take other medicines until at least 8 hours after your last dose of pain medicine, or as directed by your caregiver.  Use a bulk stool softener if you become constipated from your pain medicines. Increasing your intake of fruits  and vegetables will also help.  Write down the times when you take your medicines. Look at the times before taking your next dose of medicine. It is easy to become confused while on pain medicines. Recording the times helps you to avoid an overdose. SEEK MEDICAL CARE IF:  Your medicine is not helping the pain go away.  You vomit or have diarrhea shortly after taking the medicine.  You develop new pain in areas that did not hurt before. SEEK IMMEDIATE MEDICAL CARE IF:  You feel dizzy or faint.  You feel there are other problems that might be caused by your medicine. MAKE SURE YOU:   Understand these instructions.  Will watch your condition.  Will get help right away if you are not doing well or get worse. Document Released: 11/01/2000 Document Revised: 11/20/2012 Document Reviewed: 07/10/2010 ExitCare Patient Information 2014 [employer].  

## 2013-09-08 ENCOUNTER — Other Ambulatory Visit: Payer: Self-pay

## 2013-09-08 LAB — GLUCOSE, CAPILLARY: Glucose-Capillary: 89 mg/dL (ref 70–99)

## 2013-09-08 MED ORDER — PREDNISONE 5 MG PO TABS
5.0000 mg | ORAL_TABLET | Freq: Every day | ORAL | Status: DC
  Administered 2013-09-09 – 2013-09-11 (×3): 5 mg via ORAL
  Filled 2013-09-08 (×4): qty 1

## 2013-09-08 NOTE — Progress Notes (Signed)
Mr. Apuzzo feels a whole lot better. He now is on air mattress. We've adjusted his hydromorphone.  He had his CAT scan done. Really did not help [place] all that much. We really will need to get an MRI done. I don't think is ready for that yet.  He got 2 units of blood. His hemoglobin was 7.2.  Is breathing better. He's feeling stronger.  His chest x-ray on admission did not show any obvious infiltrates. There is no effusions.  There's been no diarrhea. He's had no fever. He's had no bleeding. He's had no problems with his colostomy.  His vital signs are stable. Temperature 97.8. Pulse 70. Blood pressure 119/65. Lungs are clear. Cardiac exam regular rate and rhythm. Abdomen is soft. Has good bowel sounds. Colostomy intact. Extremities shows no clubbing cyanosis or edema. He has stable strength in his legs. He has decent range of motion of his legs. Neurological exam shows no focal neurological deficits.  For now, we will plan for an MRI early in the week. We will try sedation on him.  We're monitoring his blood sugars. They been under fairly good control.  We will continue to adjust his pain medications.  We'll try to increase his level of activity today.  We might consider to continue his treatment for her myeloma while he is an inpatient.  We'll check his labs tomorrow.  Hopefully, we can get some physical therapy started next week.  Lum Keas  ! Samuel 16:7

## 2013-09-08 NOTE — H&P (Signed)
NAMEMarland Kitchen  DEAGEN, KR[place] NO.:  [id number]  MEDICAL RECORD NO.:  [id number]  LOCATION:  3536                         FACILITY:  [facility]  PHYSICIAN:  Volanda Napoleon, M.D.  DATE OF BIRTH:  1941-06-06  DATE OF ADMISSION:  08/16/2013 DATE OF DISCHARGE:                             HISTORY & PHYSICAL   REASON FOR ADMISSION: 1. Severe lower back pain. 2. Relapsed multiple myeloma. 3. Recent bilateral community-acquired pneumonia. 4. History of diverticulum. 5. History of pulmonary embolism. 6. Non-insulin-dependent diabetes.  HISTORY OF PRESENT ILLNESS:  Mr. Roepke is a 73 year old white gentleman.  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  He was doing very well with respect to maintenance therapy for myeloma.  In November, however, he had rapid recurrence.  He had a large lumbosacral mass.  He had disease all throughout the spine.  Bone marrow was done which showed relapsed myeloma.  He had a very poor cytogenetics with the relapse.  Again, he has had radiation therapy for symptom management.  He was started on salvage chemotherapy with Kyprolis/Cytoxan/Decadron. He has been getting Zometa.  He was admitted to the hospital back on January 17th with community- acquired pneumonia.  He was intubated for this.  He recovered from this.  He was discharged on the 26th.  He called the office and he was having severe low back pain.  This was not radiating.  He had no problems with bowels or bladder.  He is on a fentanyl patch, had 75 mcg every 3 days.  He is on oral Dilaudid.  This was not controlling the pain.  In the office, we accessed the Port-A-Cath.  We started him on some fluids and some IV pain medication.  I felt that we needed to admit him for management.  When he was last hospitalized, we tried to get him an MRI, but he cannot do this because of pain.  I think that he probably needs a kyphoplasty.  I also thought about the possibility of surgery  depending on what we find.  Again, he is being admitted for pain control.  He has had no fever.  He has had no bleeding.  His colostomy is working well.  He has had no asymmetric leg weakness.  There is no leg swelling.  ALLERGIES:  Penicillin.  PAST MEDICAL HISTORY:  As stated in medical record.  MEDICATIONS:  __________ 400 mg p.o. daily, aspirin 162 mg p.o. daily, baclofen 10 mg p.o. t.i.d., fentanyl patch 75 mcg every 3 days, Lasix 40 mg p.o. daily, Dilaudid 2-4 mg p.o. q.4 hours p.r.n. pain, Prevacid 30 mg p.o. daily, Zestril 2.5 mg p.o. daily, Ativan 0.5 mg p.o. q.6 hours p.r.n., metoprolol 25 mg p.o. b.i.d., prednisone 10 mg p.o. daily, Compazine 10 mg p.o. q.6 hours p.r.n., vitamin B6 of 250 mg p.o. daily.  SOCIAL HISTORY:  Negative for tobacco or alcohol use.  PHYSICAL EXAMINATION:  This is a chronically ill-appearing white gentleman, appears in moderate distress secondary to back pain.  Vital Signs:  Temperature of 97.5, pulse 81, respiratory rate 20, blood pressure 131/68.  Head and Neck:  Shows a normocephalic, atraumatic skull.  There are no ocular or oral lesions.  He has no thrush.  He has no adenopathy in the neck.  Lungs:  Clear bilaterally.  Cardiac: Regular rate and rhythm with normal S1, S2.  There are no murmurs, rubs, or bruits.  Abdomen:  Soft.  He has a colostomy that is intact.  He has no fluid wave.  There is no palpable hepatosplenomegaly.  Extremities: No clubbing, cyanosis, or edema.  He has 4/5 strength in his legs.  He has decent range of motion of his joints.  Skin:  Scattered ecchymosis. Neurological:  Shows no focal neurological deficits.  LABORATORY STUDIES:  White cell count is 13.3, hemoglobin 7.2, hematocrit 22.1.  Platelet count 58,000.  IMPRESSION:  Mr. Xiang is a 73 year old gentleman.  He is being admitted for pain control secondary to myeloma.  We really have to get an MRI on him ultimately.  Again, the last MRI that was done was  back in late November.  This showed progressive multiple myeloma at L5.  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  He had myelomatous progression of the lumbar levels.  Again, I think that he is probably going to need at least kyphoplasty for L5.  I am not sure what could be done with respect to the sacral tumor.  It does not __________ kind of radicular pain referred to the sacral nerve roots.  I will see about a CT scan initially.  I think he can probably do a CT scan.  We did do spinal x-rays on January 24.  No acute findings were noted. There was vertebroplasty at L3.  No new lytic or destructive lesions were noted.  We will continue him with pain management.  We will have to see about his medications and see how we can adjust his medications to try to make little bit easier for him.  For now, he is a full code.  He has his girlfriend who he has been with forever.  She is very involved with his care.     Volanda Napoleon, M.D.     PRE/MEDQ  D:  08/14/2013  T:  09/08/2013  Job:  [id number]

## 2013-09-09 LAB — CBC
HCT: 25.9 % — ABNORMAL LOW (ref 39.0–52.0)
Hemoglobin: 8.4 g/dL — ABNORMAL LOW (ref 13.0–17.0)
MCH: 29.6 pg (ref 26.0–34.0)
MCHC: 32.4 g/dL (ref 30.0–36.0)
MCV: 91.2 fL (ref 78.0–100.0)
Platelets: 45 10*3/uL — ABNORMAL LOW (ref 150–400)
RBC: 2.84 MIL/uL — AB (ref 4.22–5.81)
RDW: 20.1 % — ABNORMAL HIGH (ref 11.5–15.5)
WBC: 11 10*3/uL — ABNORMAL HIGH (ref 4.0–10.5)

## 2013-09-09 LAB — COMPREHENSIVE METABOLIC PANEL
ALT: 37 U/L (ref 0–53)
AST: 40 U/L — ABNORMAL HIGH (ref 0–37)
Albumin: 2.2 g/dL — ABNORMAL LOW (ref 3.5–5.2)
Alkaline Phosphatase: 155 U/L — ABNORMAL HIGH (ref 39–117)
BILIRUBIN TOTAL: 0.5 mg/dL (ref 0.3–1.2)
BUN: 11 mg/dL (ref 6–23)
CHLORIDE: 107 meq/L (ref 96–112)
CO2: 28 meq/L (ref 19–32)
CREATININE: 0.84 mg/dL (ref 0.50–1.35)
Calcium: 7.7 mg/dL — ABNORMAL LOW (ref 8.4–10.5)
GFR, EST NON AFRICAN AMERICAN: 85 mL/min — AB (ref >90)
Glucose, Bld: 94 mg/dL (ref 70–99)
Potassium: 3.7 mEq/L (ref 3.7–5.3)
Sodium: 141 mEq/L (ref 137–147)
Total Protein: 8.7 g/dL — ABNORMAL HIGH (ref 6.0–8.3)

## 2013-09-09 LAB — GLUCOSE, CAPILLARY: Glucose-Capillary: 82 mg/dL (ref 70–99)

## 2013-09-09 NOTE — Progress Notes (Signed)
Robert Berger   DOB:15-Feb-1941   UG#:[id number]   UUE#:[id number]  Subjective: Patient was admitted on 08/25/2013 with severe low back pain. He was started on IV pain medications and admitted to the hospital. Today he states he is feeling better. He is status post 2 units of packed red cells which has helped him to feel stronger. There is planned to get an MRI of the back sometime next week. Patient is without any complaints today.   Objective:  Filed Vitals:   09/09/13 0523  BP: 119/64  Pulse: 69  Temp: 98.3 F (36.8 C)  Resp: 18    Body mass index is 27.76 kg/(m^2).  Intake/Output Summary (Last 24 hours) at 09/09/13 1309 Last data filed at 09/09/13 0244  Gross per 24 hour  Intake    400 ml  Output    700 ml  Net   -300 ml     Sclerae unicteric  Oropharynx clear  No peripheral adenopathy  Lungs clear -- no rales or rhonchi  Heart regular rate and rhythm  Abdomen benign  MSK no focal spinal tenderness, no peripheral edema  Neuro nonfocal    CBG (last 3)   Recent Labs  09/08/13 0737 09/09/13 0750  GLUCAP 89 82     Labs:  Lab Results  Component Value Date   WBC 11.0* 09/09/2013   HGB 8.4* 09/09/2013   HCT 25.9* 09/09/2013   MCV 91.2 09/09/2013   PLT 45* 09/09/2013   NEUTROABS 0.9* 08/31/2013    Urine Studies No results found for this basename: UACOL, UAPR, USPG, UPH, UTP, UGL, UKET, UBIL, UHGB, UNIT, UROB, ULEU, UEPI, UWBC, URBC, UBAC, CAST, CRYS, UCOM, BILUA,  in the last 72 hours  Basic Metabolic Panel:  Recent Labs Lab 09/03/13 0610 09/06/2013 1735 09/09/13 0455  NA 135* 136* 141  K 4.4 4.3 3.7  CL 96 100 107  CO2 33* 28 28  GLUCOSE 116* 156* 94  BUN _0 CREATININE 0.61 0.74 0.84  CALCIUM 9.3 8.8 7.7*  MG 2.1 2.1  --   PHOS 4.7*  --   --    GFR Estimated Creatinine Clearance: 88.7 ml/min (by C-G formula based on Cr of 0.84). Liver Function Tests:  Recent Labs Lab 08/30/2013 1735 09/09/13 0455  AST 40* 40*  ALT 41 37  ALKPHOS 195* 155*   BILITOT 0.6 0.5  PROT 9.6* 8.7*  ALBUMIN 2.5* 2.2*   No results found for this basename: LIPASE, AMYLASE,  in the last 168 hours No results found for this basename: AMMONIA,  in the last 168 hours Coagulation profile  Recent Labs Lab 08/12/2013 1735  INR 1.08    CBC:  Recent Labs Lab 09/03/13 0610 09/08/2013 1735 09/09/13 0455  WBC 5.8 13.3* 11.0*  HGB 9.0* 7.2* 8.4*  HCT 27.4* 22.1* 25.9*  MCV 93.2 93.6 91.2  PLT 44* 58* 45*   Cardiac Enzymes: No results found for this basename: CKTOTAL, CKMB, CKMBINDEX, TROPONINI,  in the last 168 hours BNP: No components found with this basename: POCBNP,  CBG:  Recent Labs Lab 09/02/13 2128 09/03/13 0741 09/03/13 1150 09/08/13 0737 09/09/13 0750  GLUCAP 135* 107* 129* 89 82   D-Dimer No results found for this basename: DDIMER,  in the last 72 hours Hgb A1c No results found for this basename: HGBA1C,  in the last 72 hours Lipid Profile No results found for this basename: CHOL, HDL, LDLCALC, TRIG, CHOLHDL, LDLDIRECT,  in the last 72 hours Thyroid function studies No  results found for this basename: TSH, T4TOTAL, FREET3, T3FREE, THYROIDAB,  in the last 72 hours Anemia work up No results found for this basename: VITAMINB12, FOLATE, FERRITIN, TIBC, IRON, RETICCTPCT,  in the last 72 hours Microbiology Recent Results (from the past 240 hour(s))  RESPIRATORY VIRUS PANEL     Status: None   Collection Time    08/30/13  9:50 PM      Result Value Range Status   Source - RVPAN NOSE   Final   Respiratory Syncytial Virus A NOT DETECTED   Final   Respiratory Syncytial Virus B NOT DETECTED   Final   Influenza A NOT DETECTED   Final   Influenza B NOT DETECTED   Final   Parainfluenza 1 NOT DETECTED   Final   Parainfluenza 2 NOT DETECTED   Final   Parainfluenza 3 NOT DETECTED   Final   Metapneumovirus NOT DETECTED   Final   Rhinovirus NOT DETECTED   Final   Adenovirus NOT DETECTED   Final   Influenza A H1 NOT DETECTED   Final    Influenza A H3 NOT DETECTED   Final   Comment: (NOTE)           Normal Reference Range for each Analyte: NOT DETECTED     Testing performed using the Luminex xTAG Respiratory Viral Panel test     kit.     This test was developed and its performance characteristics determined     by [facility]. It has not been cleared or approved by the [place]     Food and Drug Administration. This test is used for clinical purposes.     It should not be regarded as investigational or for research. This     laboratory is certified under the [place] (CLIA) as qualified to perform high complexity     clinical laboratory testing.     Performed at [facility]      Studies:  Ct Lumbar Spine W Contrast  08/12/2013   CLINICAL DATA:  Severe back pain and history of myeloma. Unable to have MRI.  EXAM: CT LUMBAR SPINE WITH CONTRAST  TECHNIQUE: Multidetector CT imaging of the lumbar spine was performed with intravenous contrast administration. Multiplanar CT image reconstructions were also generated.  CONTRAST:  143m OMNIPAQUE IOHEXOL 300 MG/ML  SOLN  COMPARISON:  Abdominal CT 03/19/2013  FINDINGS: Myeloma lesions present throughout the sacrum and lumbar spine are better seen on previous MR imaging. The largest deposits, located in the anteroinferior L5 body and in the S1 body are largely inconspicuous by CT. Extra osseous extension of the S1/S2 body lesion is only partly imaged currently, although there is clear infiltration of the left anterior sacral foramina at S1, infiltrating the perineural fat. There is no evidence of other areas of progressive extra osseous extension or canal/foraminal compromise.  No evidence of interval fracture. Unchanged T12 body height loss, where there was previous extensive comminuted fracturing, status post bone augmentation. Bone augmentation was also performed in the T11 and L3 bodies.  There is diffuse spondylotic spurring of  the posterior endplates, narrowing the ventral canal at L1-2, L2-3, L3-4, L4-5. Disc narrowing and endplate spurring crowds the bilateral foramina, predominately at L3-4 and L4-5. Degenerative changes are stable from prior.  IMPRESSION: 1. No acute findings, including fracture. 2. 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 Reference lumbar  spine MRI 07/07/2013.   Electronically Signed   By: Jorje Guild M.D.   On: 08/31/2013 22:10   Portable Chest 1 View  08/20/2013   CLINICAL DATA:  Cough, weakness, recent pneumonia, history pulmonary embolism, smoking, multiple myeloma  EXAM: PORTABLE CHEST - 1 VIEW  COMPARISON:  Portable exam 1958 hr compared to 09/02/2013  FINDINGS: Right jugular Port-A-Cath tip projects over cavoatrial junction.  Upper normal heart size, accentuated by hypoinflation.  Low lung volumes with right basilar atelectasis and crowding of perihilar markings.  Minimal peribronchial thickening.  No definite infiltrate, pleural effusion or pneumothorax.  Prior spinal augmentation procedures at 4 contiguous levels of the thoracolumbar spine.  IMPRESSION: Bronchitic changes with basilar hypoinflation and right basilar atelectasis.   Electronically Signed   By: Lavonia Dana M.D.   On: 09/01/2013 20:40    Assessment/plan: 73 y.o. with history of relapsed multiple myeloma admitted on January 30 with severe low back pain for better pain control. Patient did have CT which did not help to decipher why he is having back pain. He is currently receiving pain medications and is responding to them very nicely. He is on her air mattress and this is helping as well. Plan is to get an MRI of the back during the week. He will also continue his present pain medications. His hemoglobin is 8.4 he is asymptomatic we will continue to monitor her.   Humphrey Rolls, Shaeley Segall 09/09/2013

## 2013-09-09 DEATH — deceased

## 2013-09-10 ENCOUNTER — Inpatient Hospital Stay (HOSPITAL_COMMUNITY): Payer: Medicare Other

## 2013-09-10 DIAGNOSIS — G893 Neoplasm related pain (acute) (chronic): Secondary | ICD-10-CM

## 2013-09-10 DIAGNOSIS — E119 Type 2 diabetes mellitus without complications: Secondary | ICD-10-CM

## 2013-09-10 DIAGNOSIS — R05 Cough: Secondary | ICD-10-CM

## 2013-09-10 DIAGNOSIS — C9002 Multiple myeloma in relapse: Principal | ICD-10-CM

## 2013-09-10 DIAGNOSIS — R059 Cough, unspecified: Secondary | ICD-10-CM

## 2013-09-10 DIAGNOSIS — M545 Low back pain, unspecified: Secondary | ICD-10-CM

## 2013-09-10 LAB — TYPE AND SCREEN
ABO/RH(D): A POS
Antibody Screen: NEGATIVE
UNIT DIVISION: 0
Unit division: 0

## 2013-09-10 LAB — ERYTHROPOIETIN: ERYTHROPOIETIN: 91.4 m[IU]/mL — AB (ref 2.6–18.5)

## 2013-09-10 LAB — GLUCOSE, CAPILLARY: Glucose-Capillary: 84 mg/dL (ref 70–99)

## 2013-09-10 MED ORDER — DRONABINOL 2.5 MG PO CAPS
2.5000 mg | ORAL_CAPSULE | Freq: Three times a day (TID) | ORAL | Status: DC
  Administered 2013-09-10 – 2013-09-13 (×11): 2.5 mg via ORAL
  Filled 2013-09-10 (×11): qty 1

## 2013-09-10 MED ORDER — LIP MEDEX EX OINT
TOPICAL_OINTMENT | CUTANEOUS | Status: DC | PRN
  Filled 2013-09-10: qty 7

## 2013-09-10 MED ORDER — IPRATROPIUM-ALBUTEROL 0.5-2.5 (3) MG/3ML IN SOLN
3.0000 mL | Freq: Three times a day (TID) | RESPIRATORY_TRACT | Status: DC
  Administered 2013-09-10 (×3): 3 mL via RESPIRATORY_TRACT
  Filled 2013-09-10 (×3): qty 3

## 2013-09-10 MED ORDER — HYDROMORPHONE HCL PF 1 MG/ML IJ SOLN
2.0000 mg | INTRAMUSCULAR | Status: DC | PRN
  Administered 2013-09-10: 2 mg via INTRAVENOUS
  Administered 2013-09-10: 3 mg via INTRAVENOUS
  Administered 2013-09-10 (×2): 2 mg via INTRAVENOUS
  Administered 2013-09-11: 3 mg via INTRAVENOUS
  Administered 2013-09-11: 2 mg via INTRAVENOUS
  Administered 2013-09-11 (×3): 3 mg via INTRAVENOUS
  Administered 2013-09-12 – 2013-09-19 (×24): 2 mg via INTRAVENOUS
  Filled 2013-09-10: qty 3
  Filled 2013-09-10 (×4): qty 2
  Filled 2013-09-10: qty 3
  Filled 2013-09-10 (×23): qty 2
  Filled 2013-09-10: qty 3
  Filled 2013-09-10: qty 2
  Filled 2013-09-10: qty 3
  Filled 2013-09-10 (×2): qty 2
  Filled 2013-09-10: qty 3

## 2013-09-10 MED ORDER — FENTANYL 100 MCG/HR TD PT72
100.0000 ug | MEDICATED_PATCH | TRANSDERMAL | Status: DC
  Administered 2013-09-12 – 2013-09-18 (×3): 100 ug via TRANSDERMAL
  Filled 2013-09-10 (×4): qty 1

## 2013-09-10 NOTE — Care Management Note (Signed)
Cm spoke with patient and spouse at bedside concerning discharge needs per MD order. Per pt currently active with [facility] for RN/PT. Pt states [facility] provides home oxygen. Per pt's spouse unable to transport pt to physician appointments. Cm offered pt and spouse information concerning private duty care. Pt's spouse states unable to pay out of pocket cost. Cm questioned pt if considering SNF placement, pt states unsure at this time. Awaiting PT eval. If HH recommending, MD please order PT/OT/RN/Aide/Sw. Sw can assist pt with transition into SNF if further declines. Will continue to monitor.   Venita Lick Avangeline Stockburger,MSN,RN [phone]

## 2013-09-10 NOTE — Progress Notes (Signed)
[name] is doing okay. We're still trying to adjust is a pain medication. He still requiring every 2-3 hour breakthrough pain medication. We will increase his fentanyl patch up to 100 mcg.  We now need to work on getting his back brace. Once he has this, then we can see about physical therapy.  Will see about his MRI tomorrow.  He is coughing a little more. I will check a chest x-ray. I will start him on some nebulizers.  He still has a decreased appetite. We will try some Marinol on him.  His colostomy is still working well.  His blood sugars are under fairly good control.  On physical exam, his vital signs are temperature of 98.7. Pulse 79. Blood pressure 129/79. oxygen saturation 100%. Lungs show some wheezes bilaterally. There is some dry crackles. Cardiac exam regular rate and rhythm with no murmurs rubs or bruits. Abdomen is soft. There is no palpable hepatospleno splenomegaly. 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 Extremities shows no cyanosis or edema. He is diabetic changes in his legs. Neurological exam shows no focal neurological deficits.  With his labs, hemoglobin is 8.4. Port-A-Cath 45. Creatinine 0.8. Calcium 7.7 with an albumin of 2.2.  We will check an erythropoietin level on him. This may be needed for his anemia.  Again, is not ready for discharge. We still need to work on him a few issues so that he will be ready for discharge.  Pete E.  [place] 4:4

## 2013-09-10 NOTE — Care Management Note (Signed)
   CARE MANAGEMENT NOTE 09/10/2013  Patient:  Robert Berger, Robert Berger   Account Number:  [id number]  Date Initiated:  09/10/2013  Documentation initiated by:  Iretta Mangrum  Subjective/Objective Assessment:   73 yo male admitted with severe low back pain.     Action/Plan:   Home when stable   Anticipated DC Date:     Anticipated DC Plan:  [place]  CM consult      Choice offered to / List presented to:  C-1 Patient   DME arranged  NA      DME agency  NA     [place] arranged  NA      [place] agency  NA   Status of service:  In process, will continue to follow Medicare Important Message given?   (If response is "NO", the following Medicare IM given date fields will be blank) Date Medicare IM given:   Date Additional Medicare IM given:    Discharge Disposition:    Per UR Regulation:  Reviewed for med. necessity/level of care/duration of stay  If discussed at [place] of Stay Meetings, dates discussed:    Comments:  09/10/13 [place] Chart reviewed for utilization of services. Cm to consult concerning HH needs per MD order. Awaiting PT eval.

## 2013-09-11 ENCOUNTER — Inpatient Hospital Stay (HOSPITAL_COMMUNITY): Payer: Medicare Other

## 2013-09-11 ENCOUNTER — Telehealth: Payer: Self-pay | Admitting: Hematology & Oncology

## 2013-09-11 LAB — IGG, IGA, IGM
IgA: 64 mg/dL — ABNORMAL LOW (ref 68–379)
IgG (Immunoglobin G), Serum: 3490 mg/dL — ABNORMAL HIGH (ref 650–1600)
IgM, Serum: 138 mg/dL (ref 41–251)

## 2013-09-11 LAB — IMMUNOFIXATION ADD-ON

## 2013-09-11 LAB — COMPREHENSIVE METABOLIC PANEL
ALBUMIN: 2.1 g/dL — AB (ref 3.5–5.2)
ALT: 25 U/L (ref 0–53)
AST: 29 U/L (ref 0–37)
Alkaline Phosphatase: 136 U/L — ABNORMAL HIGH (ref 39–117)
BUN: 8 mg/dL (ref 6–23)
CO2: 26 mEq/L (ref 19–32)
Calcium: 7.5 mg/dL — ABNORMAL LOW (ref 8.4–10.5)
Chloride: 106 mEq/L (ref 96–112)
Creatinine, Ser: 0.87 mg/dL (ref 0.50–1.35)
GFR calc Af Amer: >90 mL/min (ref >90)
GFR calc non Af Amer: 84 mL/min — ABNORMAL LOW (ref >90)
Glucose, Bld: 117 mg/dL — ABNORMAL HIGH (ref 70–99)
POTASSIUM: 3.8 meq/L (ref 3.7–5.3)
Sodium: 137 mEq/L (ref 137–147)
TOTAL PROTEIN: 8.8 g/dL — AB (ref 6.0–8.3)
Total Bilirubin: 0.7 mg/dL (ref 0.3–1.2)

## 2013-09-11 LAB — KAPPA/LAMBDA LIGHT CHAINS
Kappa free light chain: 71.6 mg/dL — ABNORMAL HIGH (ref 0.33–1.94)
Lambda free light chains: 0.02 mg/dL — ABNORMAL LOW (ref 0.57–2.63)

## 2013-09-11 LAB — GLUCOSE, CAPILLARY
GLUCOSE-CAPILLARY: 97 mg/dL (ref 70–99)
GLUCOSE-CAPILLARY: 108 mg/dL — AB (ref 70–99)

## 2013-09-11 LAB — CBC
HEMATOCRIT: 24 % — AB (ref 39.0–52.0)
HEMOGLOBIN: 7.8 g/dL — AB (ref 13.0–17.0)
MCH: 29.8 pg (ref 26.0–34.0)
MCHC: 32.5 g/dL (ref 30.0–36.0)
MCV: 91.6 fL (ref 78.0–100.0)
Platelets: 42 10*3/uL — ABNORMAL LOW (ref 150–400)
RBC: 2.62 MIL/uL — ABNORMAL LOW (ref 4.22–5.81)
RDW: 19.9 % — AB (ref 11.5–15.5)
WBC: 12.4 10*3/uL — ABNORMAL HIGH (ref 4.0–10.5)

## 2013-09-11 LAB — PROTEIN ELECTROPH W RFLX QUANT IMMUNOGLOBULINS
ALPHA-2-GLOBULIN: 11.1 % (ref 7.1–11.8)
Albumin ELP: 37.9 % — ABNORMAL LOW (ref 55.8–66.1)
Alpha-1-Globulin: 6.8 % — ABNORMAL HIGH (ref 2.9–4.9)
Beta 2: 2.2 % — ABNORMAL LOW (ref 3.2–6.5)
Beta Globulin: 4.5 % — ABNORMAL LOW (ref 4.7–7.2)
GAMMA GLOBULIN: 37.5 % — AB (ref 11.1–18.8)
M-Spike, %: 2.81 g/dL
TOTAL PROTEIN ELP: 8.1 g/dL (ref 6.0–8.3)

## 2013-09-11 MED ORDER — DARBEPOETIN ALFA-POLYSORBATE 300 MCG/0.6ML IJ SOLN
300.0000 ug | Freq: Once | INTRAMUSCULAR | Status: DC
  Filled 2013-09-11: qty 0.6

## 2013-09-11 MED ORDER — GADOBENATE DIMEGLUMINE 529 MG/ML IV SOLN
18.0000 mL | Freq: Once | INTRAVENOUS | Status: AC | PRN
  Administered 2013-09-11: 18 mL via INTRAVENOUS

## 2013-09-11 MED ORDER — DIAZEPAM 5 MG/ML IJ SOLN
10.0000 mg | INTRAMUSCULAR | Status: AC
  Administered 2013-09-11: 5 mg via INTRAVENOUS
  Filled 2013-09-11: qty 2

## 2013-09-11 MED ORDER — IPRATROPIUM-ALBUTEROL 0.5-2.5 (3) MG/3ML IN SOLN
3.0000 mL | Freq: Three times a day (TID) | RESPIRATORY_TRACT | Status: DC
  Administered 2013-09-11 – 2013-09-13 (×7): 3 mL via RESPIRATORY_TRACT
  Filled 2013-09-11 (×7): qty 3

## 2013-09-11 MED ORDER — DEXAMETHASONE SODIUM PHOSPHATE 4 MG/ML IJ SOLN
4.0000 mg | Freq: Four times a day (QID) | INTRAMUSCULAR | Status: DC
  Administered 2013-09-12 (×2): 4 mg via INTRAVENOUS
  Filled 2013-09-11 (×6): qty 1

## 2013-09-11 NOTE — Progress Notes (Signed)
Brace delivered by [employer]

## 2013-09-11 NOTE — Progress Notes (Signed)
[name] is a little better. His pain control is getting a little bit better. I think we do his MRI today.  I ordered the back brace for him. I don't think PT will see him until the back brace arrives.  He is eating a little better. He is on oral Marinol.  He has had a little cough. Chest x-ray showed some bronchitis. His intake and output are pretty eating.  Marland Kitchen Has had no problems with his colostomy. There's been no nausea vomiting.  I talked to about ESA. I think that this might help with his anemia. His erythropoietin level is 91. I talked about potential side effects. I told him about high blood pressure and the risk of stroke. I think this risk is less than 5%. He agrees to the Aranesp.  On his vital signs, his temperature is 97.6. Pulse is 74. Blood pressure 124/72. His lungs are clear. There's no crackles. I hear no wheezes. Cardiac exam regular in rhythm with a normal S1-S2. Abdomen is soft. 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 There is no tenderness. He has good bowel sounds. Extremities shows no clubbing cyanosis or edema.  His blood sugars have been doing pretty well. His labs show a white cell count to be 12.4. Hemoglobin 7.8. Platelet count 42,000. His creatinine is 0.87. Calcium 7.5 with an albumin of 2.1.  Again, the MRI will be the key. Hopefully, we can consider kyphoplasty for him depend on what the MRI shows.  I were to the back brace yesterday. This hopefully will come today and physical therapy will then be able to work with him.  I appreciate the great care that he is getting on [place]!!  2 Cor 12:9-10

## 2013-09-11 NOTE — Progress Notes (Signed)
[employer] [place] called regarding LSO-L /S support Brace as ordered by Dr Jonette Eva.

## 2013-09-11 NOTE — Progress Notes (Signed)
Patient is not seen. Was notified MRI showed new findings of cord compression. I placed him on IV dexamethasone and notified the nurse. I also consulted radiation oncologist for possible radiation therapy and discussed with radiation oncologist over the phone tonight

## 2013-09-11 NOTE — Progress Notes (Signed)
  Radiation Oncology         [phone] ________________________________  Name: Robert Berger MRN: [id number]  Date: 09/01/2013  DOB: [birth year]  On Call Chart Note:  I received a call from Dr. Alvy Bimler this evening and we reviewed this patient's history of myeloma and most recent findings.    He is a 73 yo man with h/o multiple myeloma and previous localized therapy:  1.  03/21/12 Dr. Estanislado Pandy coblated and augmented T9-12  2.  8/15-28/13   Dr. Isidore Moos treated T8-L4 spine to 20 Gy in 10 fractions  3.  01/10/13 Dr. Estanislado Pandy augmented L3  4.  12/16-31/14  Dr. 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 Positive for mild cord compression at the T10 level (series 9, image 8). No definite associated spinal cord signal abnormality. Epidural tumor here is contiguous with Mild lumbar dural thickening and increased enhancement which tracks caudally to the L1 level. Superimposed lateral paraspinal tumor extension most pronounced at T10 and greater on the left (series 8, image 7)."  Given the previous dose of radiation employed, re-irradiation may be feasible for palliation.  At this time, the patient will proceed with initiation of decadron which should provide some reduction in any spinal cord edema.  We will see him for consideration of other treatment options.    Plan:  At this point, the patient is set up to proceed with radiation oncology re-evaluation on 09/12/13 in the morning.  ________________________________  Sheral Apley. Tammi Klippel, M.D.

## 2013-09-11 NOTE — Telephone Encounter (Signed)
Pt's sign other Tiana [place]) brought back all the financial assistance apps today. Pt has applied for [employer] Patient Support Program (Juanna Cao), [employer] [place] Program.  All apps are being forwarded to appropriate programs accordingly at this time.

## 2013-09-11 NOTE — Evaluation (Signed)
Physical Therapy Evaluation Patient Details Name: Robert Berger MRN: [id number] DOB: 06-27-1941 Today's Date: 09/11/2013 Time: 3818-2993 PT Time Calculation (min): 34 min  PT Assessment / Plan / Recommendation History of Present Illness  Robert Berger  is a 73 y.o. male, history of multiple myeloma undergoing active chemotherapy and radiation under the care of Dr. Marin Olp, hypertension, pain due to multiple myeloma, admitted with asevere back pain.. CT spine shows extraosseous mass affecting S1.S2. 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 He was then transferred here for further care. Scheduled for MRI of spine today.  Clinical Impression  Pt ver willing to mobilize to the edge of bed and stand. LSO donned/doffed while in bed. Pt was able to stand briefly. Pt had IV medication just prior to session and tolerated well. Pt's wife present. Pt is requiring 2 person assist at present. Pt will benefit from PT to address problems listed to improve in functional mobility, safety and wear of LSO.    PT Assessment  Patient needs continued PT services    Follow Up Recommendations  Home health PT;SNF (vs. HHPT if pain is controlled and wife can asist in/out of house on steps. needs a ramp. wife reports HHPT assessed and wiould not e adequate , still would have 1 step.)    Does the patient have the potential to tolerate intense rehabilitation      Barriers to Discharge Decreased caregiver support      Equipment Recommendations  None  recommended by PT    Recommendations for Other Services OT consult   Frequency Min 3X/week    Precautions / Restrictions Precautions Precautions: Fall Precaution Comments: on O2 Required Braces or Orthoses: Spinal Brace Spinal Brace: Lumbar corset Other Brace/Splint: place on when in supine if able for pain control.   Pertinent Vitals/Pain Pt reports pain is 10 before IV meds effective.  Decreased during mobility.      Mobility  Bed Mobility Overal bed mobility: Needs Assistance;+2 for physical assistance;+ 2 for safety/equipment Bed Mobility: Rolling;Sidelying to Sit;Sit to Sidelying Rolling: Min assist, Long sleeved shirt placed on in sideling before brace applied.  Sidelying to sit: +2 for physical assistance;+2 for safety/equipment;Total assist;HOB elevated Sit to supine: HOB elevated;+2 for physical assistance;+2 for safety/equipment;Max assist Sit to sidelying: +2 for physical assistance;+2 for safety/equipment;Max assist General bed mobility comments: pt uses HOB to assist with trunk  in sitting and back to sidlying. Support to get trunk into upright. Pt sat a second time without the brace to remove his shirt. Transfers Overall transfer level: Needs assistance Equipment used: Rolling walker (2 wheeled) Transfers: Sit to/from Stand Sit to Stand: +2 physical assistance;+2 safety/equipment;Max assist;From elevated surface General transfer comment: practiced x 2.  Ambulation/Gait Ambulation/Gait assistance: +2 physical assistance;+2 safety/equipment;Max assist Gait Pattern/deviations: Step-to pattern General Gait Details: took 2 steps forward and back, marched in place x 5  steps.    Exercises     PT Diagnosis: Difficulty walking;Generalized weakness;Acute pain  PT Problem List: Decreased strength;Decreased activity tolerance;Decreased mobility;Decreased knowledge of  use of DME;Impaired sensation;Pain PT Treatment Interventions: DME instruction;Functional mobility  training;Therapeutic activities;Therapeutic exercise;Patient/family education;Gait training     PT Goals(Current goals can be found in the care plan section) Acute Rehab PT Goals Patient Stated Goal: to not have back pain PT Goal Formulation: With patient Time For Goal Achievement: 09/25/13 Potential to Achieve Goals: Fair  Visit Information  Last PT Received On: 09/11/13 Assistance Needed: +2 History of Present Illness: Robert Berger  is a 73 y.o. male, history of multiple myeloma undergoing active chemotherapy and radiation under the care of Dr. Marin Olp, hypertension, pain due to multiple myeloma, admitted with asevere back pain.. CT spine shows extraosseous mass affecting S1.S2. 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 He was then transferred here for further care.       Prior [place] expects to be discharged to:: Private residence Living Arrangements: Spouse/significant other Available Help at Discharge: Family Type of Home: House Home Access: Stairs to enter Technical brewer of Steps: 2 Entrance Stairs-Rails: None Home Layout: One level Home Equipment: Environmental consultant - 2 wheels;Wheelchair - manual;Bedside commode;Cane - single point Additional Comments: pt well known to PT  Prior Function Level of Independence: Needs assistance Gait / Transfers Assistance Needed: has been using WC mostly except has to go up/down 2 steps.    Cognition  Cognition Arousal/Alertness:  Awake/alert Behavior During Therapy: WFL for tasks assessed/performed Overall Cognitive Status: Within Functional Limits for tasks assessed    Extremity/Trunk Assessment Upper Extremity Assessment Upper Extremity Assessment: Overall WFL for tasks assessed Lower Extremity Assessment Lower Extremity Assessment: LLE deficits/detail;RLE deficits/detail RLE Deficits / Details: able to stand and take a few small steps. RLE Sensation: history of peripheral neuropathy LLE Deficits / Details: same as r LLE Sensation: history of peripheral neuropathy   Balance Balance Overall balance assessment: Needs assistance Sitting-balance support: Bilateral upper extremity supported;Feet supported Sitting balance-Leahy Scale: Fair  End of Session PT - End of Session Equipment Utilized During Treatment: Gait belt Activity Tolerance: Patient limited by fatigue Patient left: in bed;with call bell/phone within reach;with family/visitor present Nurse Communication: Mobility status  GP     Claretha Cooper 09/11/2013, 12:51 PM Tresa Endo PT [phone]

## 2013-09-12 ENCOUNTER — Ambulatory Visit
Admit: 2013-09-12 | Discharge: 2013-09-12 | Disposition: A | Discharge status: Home / Self Care | Payer: BC Managed Care – PPO | Attending: Radiation Oncology | Admitting: Radiation Oncology

## 2013-09-12 ENCOUNTER — Encounter (HOSPITAL_COMMUNITY): Payer: Self-pay | Admitting: *Deleted

## 2013-09-12 ENCOUNTER — Inpatient Hospital Stay
Admit: 2013-09-12 | Discharge: 2013-09-12 | Disposition: A | Discharge status: Home / Self Care | Payer: Self-pay | Attending: Radiation Oncology | Admitting: Radiation Oncology

## 2013-09-12 ENCOUNTER — Encounter: Payer: Self-pay | Admitting: Hematology & Oncology

## 2013-09-12 ENCOUNTER — Inpatient Hospital Stay (HOSPITAL_COMMUNITY): Payer: Medicare Other

## 2013-09-12 ENCOUNTER — Telehealth: Payer: Self-pay | Admitting: *Deleted

## 2013-09-12 ENCOUNTER — Encounter: Payer: Self-pay | Admitting: Radiation Oncology

## 2013-09-12 DIAGNOSIS — R52 Pain, unspecified: Secondary | ICD-10-CM

## 2013-09-12 DIAGNOSIS — C9000 Multiple myeloma not having achieved remission: Secondary | ICD-10-CM

## 2013-09-12 DIAGNOSIS — C9002 Multiple myeloma in relapse: Secondary | ICD-10-CM

## 2013-09-12 DIAGNOSIS — M8448XA Pathological fracture, other site, initial encounter for fracture: Secondary | ICD-10-CM

## 2013-09-12 LAB — GLUCOSE, CAPILLARY: Glucose-Capillary: 145 mg/dL — ABNORMAL HIGH (ref 70–99)

## 2013-09-12 MED ORDER — DEXAMETHASONE SODIUM PHOSPHATE 10 MG/ML IJ SOLN
10.0000 mg | Freq: Two times a day (BID) | INTRAMUSCULAR | Status: DC
  Administered 2013-09-12 – 2013-09-13 (×4): 10 mg via INTRAVENOUS
  Filled 2013-09-12 (×2): qty 1
  Filled 2013-09-12: qty 2.5
  Filled 2013-09-12 (×2): qty 1
  Filled 2013-09-12: qty 2.5

## 2013-09-12 MED ORDER — DIAZEPAM 5 MG/ML IJ SOLN
10.0000 mg | INTRAMUSCULAR | Status: AC
  Administered 2013-09-12: 10 mg via INTRAVENOUS
  Filled 2013-09-12: qty 2

## 2013-09-12 MED ORDER — BENZONATATE 100 MG PO CAPS
100.0000 mg | ORAL_CAPSULE | Freq: Three times a day (TID) | ORAL | Status: DC | PRN
  Filled 2013-09-12: qty 1

## 2013-09-12 NOTE — Consult Note (Signed)
Radiation Oncology         [phone] ________________________________  Inpatient Re-Consultation  Name: Robert Berger MRN: [id number]  Date: 08/10/2013  DOB: 1940/12/28  CC:O'SULLIVAN,MELISSA S., NP  Arlan Organ MD   REFERRING PHYSICIAN: Artis Delay MD  DIAGNOSIS: Multiple Myeloma, relapsed, spine, with cord compression  HISTORY OF PRESENT ILLNESS::Robert Berger is a 73 y.o. male who has  multiple myeloma and previous localized therapy:  1. 03/21/12 Dr. Corliss Skains coblated and augmented T9-12  2. 8/15-28/13 Dr. Basilio Cairo treated T8-L4 spine to 20 Gy in 10 fractions  3. 01/10/13 Dr. Corliss Skains augmented L3  4. 12/16-31/14 Dr. Roselind Messier treated lower lumbar spine and upper sacrum to 25 Gy in 10 fractions   He was recently admitted with severe low back pain. A lumbar MRI showed: "epidural tumor throughout the visible thoracic levels T8 - T11. Positive for mild cord compression at the T10 level (series 9, image 8). No definite associated spinal cord signal abnormality. Epidural tumor here is contiguous with Mild lumbar dural thickening and increased enhancement which tracks caudally to the L1 level. Superimposed lateral paraspinal tumor extension most pronounced at T10 and greater on the left (series 8, image 7)." There is also possible dural involvement in the upper lumbar region (tracks caudally to L1), but not epidural tumor there.  He has proceeded with initiation of decadron already which should provide some reduction in any spinal cord edema. I am seeing him for consideration of other treatment options.  Dr. Myna Hidalgo suspects that he is  "going to need to increase the "intensity" of the chemotherapy. He is behaving like plasma cell leukemia. As such, I think we are going to need to try a regimen that is almost leukemic-like." The patient reports that his pain is well alleviated by Dilaudid. He reports that he was able to ambulate remission, but this is extremely difficult due to the pain  that it caused. He denies any increased weakness or any numbness. He reports that recently he has had some mild urinary incontinence. He has a colostomy and this is producing stool as usual. He reports that his back pain is concentrated in the mid lumbar region, particularly on the left. He denies pain in the mid back. He reports stable and chronic lower extremity peripheral neuropathy.    PREVIOUS RADIATION THERAPY: Yes as above. I reviewed his prior radiotherapy and superimposed the plans upon one another to see where there is significant overlap of dose. There is no significant overlap of dose above L4.  PAST MEDICAL HISTORY:  has a past medical history of Arthritis; GERD (gastroesophageal reflux disease); Complication of anesthesia; Multiple myeloma; Status post radiation therapy (03/23/12 - 04/05/12); Dysphagia (05/06/12); DVT (deep venous thrombosis); PE (pulmonary embolism); Cellulitis; Abscess; History of radiation therapy (07/24/13-08/08/13); and Spinal cord compression.    PAST SURGICAL HISTORY: Past Surgical History  Procedure Laterality Date  . Hemorrhoid surgery    . Laparotomy  05/14/2012    Procedure: EXPLORATORY LAPAROTOMY;  Surgeon: Velora Heckler, MD;  Location: [facility];  Service: General;  Laterality: N/A;  . Colostomy  05/14/2012    Procedure: COLOSTOMY;  Surgeon: Velora Heckler, MD;  Location: [facility];  Service: General;  Laterality: N/A;  . Back surgery      47 years ago  . Fracture surgery      ankle    FAMILY HISTORY: family history includes Cancer in his maternal grandfather and maternal grandmother.  SOCIAL HISTORY:  reports that he quit smoking about 35  years ago. His smoking use included Cigarettes. He started smoking about 56 years ago. He has a 30 pack-year smoking history. He has never used smokeless tobacco. He reports that he does not drink alcohol or use illicit drugs.  ALLERGIES: Penicillins  MEDICATIONS:  Current Facility-Administered Medications  Medication  Dose Route Frequency Provider Last Rate Last Dose  . 0.9 %  sodium chloride infusion   Intravenous Continuous Volanda Napoleon, MD 50 mL/hr at 09/10/13 1015    . acyclovir (ZOVIRAX) tablet 400 mg  400 mg Oral Daily Volanda Napoleon, MD   400 mg at 09/12/13 1000  . benzonatate (TESSALON) capsule 100 mg  100 mg Oral TID PRN Volanda Napoleon, MD      . dexamethasone (DECADRON) injection 10 mg  10 mg Intravenous Q12H Volanda Napoleon, MD   10 mg at 09/12/13 1032  . dronabinol (MARINOL) capsule 2.5 mg  2.5 mg Oral TID AC Volanda Napoleon, MD   2.5 mg at 09/12/13 0830  . fentaNYL (DURAGESIC - dosed mcg/hr) 100 mcg  100 mcg Transdermal Q72H Volanda Napoleon, MD   100 mcg at 09/12/13 1021  . HYDROmorphone (DILAUDID) injection 2-3 mg  2-3 mg Intravenous Q2H PRN Volanda Napoleon, MD   2 mg at 09/12/13 1022  . HYDROmorphone (DILAUDID) tablet 2 mg  2 mg Oral Q4H PRN Volanda Napoleon, MD   2 mg at 09/11/13 1853  . ipratropium-albuterol (DUONEB) 0.5-2.5 (3) MG/3ML nebulizer solution 3 mL  3 mL Nebulization TID Volanda Napoleon, MD   3 mL at 09/12/13 0810  . lip balm (CARMEX) ointment   Topical PRN Volanda Napoleon, MD      . LORazepam (ATIVAN) tablet 0.5 mg  0.5 mg Oral Q6H PRN Volanda Napoleon, MD      . metoprolol tartrate (LOPRESSOR) tablet 25 mg  25 mg Oral BID Volanda Napoleon, MD   25 mg at 09/12/13 1022  . pantoprazole (PROTONIX) EC tablet 40 mg  40 mg Oral Daily Volanda Napoleon, MD   40 mg at 09/12/13 1022  . prochlorperazine (COMPAZINE) tablet 10 mg  10 mg Oral Q6H PRN Volanda Napoleon, MD        REVIEW OF SYSTEMS:  Notable for that above.   PHYSICAL EXAM:  height is $RemoveB'5\' 10"'HUflklYf$  (1.778 m) and weight is 193 lb 8 oz (87.771 kg). His oral temperature is 97.8 F (36.6 C). His blood pressure is 119/64 and his pulse is 78. His respiration is 18 and oxygen saturation is 98%.   General: Alert and oriented, in no acute distress. Lying in bed. Heart: Regular in rate and rhythm with no murmurs, rubs, or gallops. Chest: Clear  to auscultation anteriorly Abdomen: Soft, nontender, nondistended, with no rigidity or guarding. + Colostomy bag. Normal active bowel sounds Extremities: Muscle atrophy of all extremities. No edema Skin: Diffuse bruising and erythematous lesions over his extremities  Musculoskeletal: symmetric strength and muscle tone throughout. Pain to palpation concentrated in the left mid lumbar region of his back. He denies tenderness to palpation in the thoracic region Neurologic: Cranial nerves II through XII are grossly intact. No obvious focalities. Speech is fluent. Coordination is intact. No numbness in his extremities Psychiatric: Judgment and insight are intact. Affect is appropriate.   ECOG = 3  0 - Asymptomatic (Fully active, able to carry on all predisease activities without restriction)  1 - Symptomatic but completely ambulatory (Restricted in physically strenuous activity but ambulatory and  able to carry out work of a light or sedentary nature. For example, light housework, office work)  2 - Symptomatic, <50% in bed during the day (Ambulatory and capable of all self care but unable to carry out any work activities. Up and about more than 50% of waking hours)  3 - Symptomatic, >50% in bed, but not bedbound (Capable of only limited self-care, confined to bed or chair 50% or more of waking hours)  4 - Bedbound (Completely disabled. Cannot carry on any self-care. Totally confined to bed or chair)  5 - Death   Robert Berger, Creech RH, Tormey DC, et al. [phone]). "Toxicity and response criteria of the [facility]". [place] Oncol. 5 (6): 649-55   LABORATORY DATA:  Lab Results  Component Value Date   WBC 12.4* 09/11/2013   HGB 7.8* 09/11/2013   HCT 24.0* 09/11/2013   MCV 91.6 09/11/2013   PLT 42* 09/11/2013   CMP     Component Value Date/Time   NA 137 09/11/2013 0455   NA 135* 08/23/2013 1229   NA 143 08/13/2013 1317   K 3.8 09/11/2013 0455   K 3.9 08/23/2013 1229   K 4.1  08/13/2013 1317   CL 106 09/11/2013 0455   CL 103 08/13/2013 1317   CO2 26 09/11/2013 0455   CO2 22 08/23/2013 1229   CO2 32 08/13/2013 1317   GLUCOSE 117* 09/11/2013 0455   GLUCOSE 153* 08/23/2013 1229   GLUCOSE 139* 08/13/2013 1317   BUN 8 09/11/2013 0455   BUN 9.8 08/23/2013 1229   BUN 15 08/13/2013 1317   CREATININE 0.87 09/11/2013 0455   CREATININE 0.9 08/23/2013 1229   CREATININE 0.7 08/13/2013 1317   CALCIUM 7.5* 09/11/2013 0455   CALCIUM 8.8 08/23/2013 1229   CALCIUM 8.9 08/13/2013 1317   PROT 8.8* 09/11/2013 0455   PROT 8.9* 08/23/2013 1229   PROT 7.2 08/13/2013 1317   ALBUMIN 2.1* 09/11/2013 0455   ALBUMIN 2.5* 08/23/2013 1229   AST 29 09/11/2013 0455   AST 51* 08/23/2013 1229   AST 36 08/13/2013 1317   ALT 25 09/11/2013 0455   ALT 34 08/23/2013 1229   ALT 41 08/13/2013 1317   ALKPHOS 136* 09/11/2013 0455   ALKPHOS 176* 08/23/2013 1229   ALKPHOS 108* 08/13/2013 1317   BILITOT 0.7 09/11/2013 0455   BILITOT 0.63 08/23/2013 1229   BILITOT 0.70 08/13/2013 1317   GFRNONAA 84* 09/11/2013 0455   GFRAA >90 09/11/2013 0455         RADIOGRAPHY: Dg Chest 2 View  08/25/2013   CLINICAL DATA:  Hypoxia, dyspnea, and chest discomfort with history of multiple myeloma and pulmonary embolism. Marland Kitchen  EXAM: CHEST  2 VIEW  COMPARISON:  CT scan of the chest dated August 20, 2013.  FINDINGS: Since the previous study there have developed fluffy alveolar infiltrates bilaterally. The cardiopericardial silhouette does not appear enlarged. There is no pleural effusion. The mediastinum is normal in width. The Port-A-Cath appliance is unchanged in appearance. The patient has undergone kyphoplasty at the T10 through L1 levels.  IMPRESSION: 1. New fluffy alveolar densities in both lungs are worrisome for pneumonia. CHF is not problematic Lee excluded. 2. There is no evidence of a pleural effusion. 3. The patient has known paravertebral soft tissue masses consistent with myeloma. These are not clearly evident on today's study.   Electronically Signed   By: David   [place]   On: 08/25/2013 13:26   Dg Lumbar Spine 2-3 Views  09/01/2013  CLINICAL DATA:  Low back pain.  Multiple myeloma.  EXAM: LUMBAR SPINE - 2-3 VIEW  COMPARISON:  Lumbar spine MRI on 07/07/2013  FINDINGS: No evidence of acute lumbar spine fracture or subluxation. 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 Previous vertebroplasty also noted at L3. Mild degenerative disc disease is seen at all lumbar levels. No new lytic or destructive bone lesions visualized.  IMPRESSION: No acute findings.  Mild multilevel degenerative disc disease.  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   Electronically Signed   By: Earle Gell M.D.   On: 09/01/2013 15:12   Ct Angio Chest Pe W/cm &/or Wo Cm  08/26/2013   CLINICAL DATA:  Unexplained persistent dyspnea and tachycardia, history of multiple myeloma and previous history of pulmonary embolism.  EXAM: CT ANGIOGRAPHY CHEST WITH CONTRAST  TECHNIQUE: Multidetector CT imaging of the chest was performed using the standard protocol during bolus administration of intravenous contrast. Multiplanar CT image reconstructions including MIPs were obtained to evaluate the vascular anatomy.  CONTRAST:  163mL OMNIPAQUE IOHEXOL 350 MG/ML SOLN  COMPARISON:  PA and lateral chest x-ray of February 22, 2014 and CT scan of the chest dated August 20, 2013  FINDINGS: Contrast within the pulmonary arterial tree is normal in appearance. There are no filling defects to suggest an acute pulmonary embolism. Review of the MIP images confirms the above findings. 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 The caliber of the thoracic aorta is normal. Since the previous study small bilateral pleural effusions have developed. There is no pericardial effusion. The cardiac chambers are normal in size. There is no bulky mediastinal nor hilar lymphadenopathy. There is chronic enlargement of  the left thyroid lobe. Again demonstrated is abnormal soft tissue density in the left paravertebral region at approximately T8 and T9.  At lung window settings there has been interval development of fluffy partially confluent bilateral alveolar infiltrates. The upper lobes appear to be the most conspicuously involved.  Known skeletal involvement by the patient's multiple myeloma is again demonstrated.  Within the upper abdomen the observed portions of the liver appear normal. There are tiny radiodensities associated with the dependent portion of the gallbladder near the neck. The spleen is subjectively enlarged.  IMPRESSION: 1. There are new fluffy bilateral widespread pulmonary alveolar infiltrates consistent with pneumonia or pneumonitis. 2. There are new small bilateral pleural effusions layering posteriorly. 3. Stable findings of known skeletal and paravertebral involvement by the patient's known myeloma are demonstrated. 4. There is no evidence of an acute pulmonary embolism.   Electronically Signed   By: David  Martin[place]   On: 08/26/2013 17:07   Ct Angio Chest Pe W/cm &/or Wo Cm  08/20/2013   CLINICAL DATA:  Shortness of breath and fatigue. History of DVT. Multiple myeloma.  EXAM: CT ANGIOGRAPHY CHEST WITH CONTRAST  TECHNIQUE: Multidetector CT imaging of the chest was performed using the standard protocol during bolus administration of intravenous contrast. Multiplanar CT image reconstructions including MIPs were obtained to evaluate the vascular anatomy.  CONTRAST:  176mL OMNIPAQUE IOHEXOL 350 MG/ML SOLN  COMPARISON:  05/13/2012  FINDINGS: THORACIC INLET/BODY WALL:  Enlargement of the left lobe thyroid gland with coarse calcifications, stable from 2013.  MEDIASTINUM:  Normal heart size. No pericardial effusion. Multi focal coronary artery atherosclerosis. No acute vascular abnormality, including pulmonary embolism or aortic dissection. No adenopathy.  LUNG WINDOWS:  No consolidation.  No effusion.  No  suspicious  pulmonary nodule.  UPPER ABDOMEN:  No acute findings.  OSSEOUS:  There are scattered lytic lesions that the imaged skeleton, consistent with myeloma. No acute fracture appreciated. The most notable findings in the thoracic spine is extensive osteolysis of the T9, T10, and T11 bodies, encompassing previously placed bone cement. There is increasing paravertebral soft tissue mass, bilaterally T9-10 and on the left at T8. The nodule at the level of T8 measures 18 Berger. Although anemic, the hemo below levels are not as low as would be expected for extramedullary hematopoiesis. Additionally, the paraspinal abnormalities are limited to levels with definite myeloma this involvement of the bone. Question new growth into the posterior canal at the level of T10. No acute fracture appreciated. Other notable lytic areas within the manubrium and T3 body.  Review of the MIP images confirms the above findings.  IMPRESSION: 1. Negative for pulmonary embolism or other acute intrathoracic abnormality. 2. Multiple myeloma. Compared to thoracic spine MRI 07/27/2013, new/larger paravertebral extension at the level of T8 and T9/10. If there are new thoracic cord symptoms, MRI could re-evaluate the spinal canal at T10.   Electronically Signed   By: Jorje Guild M.D.   On: 08/20/2013 03:41   Ct Lumbar Spine W Contrast  09/08/2013   CLINICAL DATA:  Severe back pain and history of myeloma. Unable to have MRI.  EXAM: CT LUMBAR SPINE WITH CONTRAST  TECHNIQUE: Multidetector CT imaging of the lumbar spine was performed with intravenous contrast administration. Multiplanar CT image reconstructions were also generated.  CONTRAST:  127mL OMNIPAQUE IOHEXOL 300 MG/ML  SOLN  COMPARISON:  Abdominal CT 03/19/2013  FINDINGS: Myeloma lesions present throughout the sacrum and lumbar spine are better seen on previous MR imaging. The largest deposits, located in the anteroinferior L5 body and in the S1 body are largely inconspicuous by CT.  Extra osseous extension of the S1/S2 body lesion is only partly imaged currently, although there is clear infiltration of the left anterior sacral foramina at S1, infiltrating the perineural fat. There is no evidence of other areas of progressive extra osseous extension or canal/foraminal compromise.  No evidence of interval fracture. Unchanged T12 body height loss, where there was previous extensive comminuted fracturing, status post bone augmentation. Bone augmentation was also performed in the T11 and L3 bodies.  There is diffuse spondylotic spurring of the posterior endplates, narrowing the ventral canal at L1-2, L2-3, L3-4, L4-5. Disc narrowing and endplate spurring crowds the bilateral foramina, predominately at L3-4 and L4-5. Degenerative changes are stable from prior.  IMPRESSION: 1. No acute findings, including fracture. 2. 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 Reference lumbar spine MRI 07/07/2013.   Electronically Signed   By: Jorje Guild M.D.   On: 09/05/2013 22:10   Mr Lumbar Spine W Wo Contrast  09/11/2013   ADDENDUM REPORT: 09/11/2013 20:50  ADDENDUM: RN Bary Richard on [facility] advised of the Critical Value/emergent results by telephone on 09/11/2013 at 8:49 PM.   Electronically Signed   By: Lars Pinks M.D.   On: 09/11/2013 20:50   09/11/2013   CLINICAL DATA:  73 year old male with severe low back pain. Initial encounter. Multiple myeloma status post chemotherapy and radiation. History of pathologic fractures status post augmentation.  EXAM: MRI LUMBAR SPINE WITHOUT AND WITH CONTRAST  TECHNIQUE: Multiplanar and multiecho pulse sequences of the lumbar spine were obtained without and with intravenous contrast.  CONTRAST:  36mL MULTIHANCE GADOBENATE DIMEGLUMINE  529 MG/ML IV SOLN  COMPARISON:  CT lumbar spine 08/13/2013. Lumbar MRI 07/07/2013 and CTA chest 08/26/2013.  FINDINGS: Widespread loss of the remaining normal bone  marrow signal in the visible spine and pelvis. Only minimal residual fatty marrow in the L4 vertebra (compare series 4 today with series 3 on 07/07/2013). Previous vertebral body augmentation at T9, T10, T11, and T12. There does appear to be mild residual marrow edema and enhancement at T9 and T10, in areas which were highly lytic on 08/26/2013.  Interval regressed central sacral tumor at S1. Subsequently, improved patency of the epidural space at the left S1 and S2 nerve root/nerve levels. There is residual signal abnormality along the course of the left S1 nerve.  However, there is epidural tumor throughout the visible thoracic levels T8 - T11. Positive for mild cord compression at the T10 level (series 9, image 8). No definite associated spinal cord signal abnormality. Epidural tumor here is contiguous with Mild lumbar dural thickening and increased enhancement which tracks caudally to the L1 level. Superimposed lateral paraspinal tumor extension most pronounced at T10 and greater on the left (series 8, image 7).  The conus medullaris appears normal at L1. No abnormal intradural enhancement identified.  No lumbar epidural tumor.  Lumbar degenerative changes are stable.  Stable visualized abdominal viscera.  IMPRESSION: 1. Interval loss of residual normal bone marrow signal throughout the visible spine and pelvis. Burtis Junes this reflects interval progression of myelomatous involvement. Myeloma with superimposed treatment effect (red marrow reactivation) is felt less likely. 2. Visible lower thoracic spine positive for diffuse involvement with epidural and paraspinal tumor. There is cord compression without spinal cord edema at T10 (see series 9, image 8). 3. At the same time, regressed central sacral tumor, and improved appearance of the course of the left S1 and S2 nerves. 4. Suspect upper lumbar early dural involvement by tumor, but no lumbar epidural tumor or malignant spinal stenosis.  Dr. Burney Gauze was paged  regarding the above Critical Value/emergent results on 09/11/2013 at 6:52PM.  Electronically Signed: By: Lars Pinks M.D. On: 09/11/2013 19:02   Ir Fluoro Guide Cv Line Right  08/21/2013   INDICATION: History of multiple myeloma, in need of intravenous access for the initiation of chemotherapy.  EXAM: IMPLANTED PORT A CATH PLACEMENT WITH ULTRASOUND AND FLUOROSCOPIC GUIDANCE  MEDICATIONS: Vancomycin 1 g IV; IV antibiotic was given in an appropriate time interval prior to skin puncture.  ANESTHESIA/SEDATION: Versed 3 mg IV; Fentanyl 100 mcg IV;  Total Moderate Sedation Time  30  minutes.  CONTRAST:  None  COMPARISON:  None.  FLUOROSCOPY TIME:  48 seconds.  PROCEDURE: The procedure, risks, benefits, and alternatives were explained to the patient. Questions regarding the procedure were encouraged and answered. The patient understands and consents to the procedure.  The right neck and chest were prepped with chlorhexidine in a sterile fashion, and a sterile drape was applied covering the operative field. Maximum barrier sterile technique with sterile gowns and gloves were used for the procedure. A timeout was performed prior to the initiation of the procedure. Local anesthesia was provided with 1% lidocaine with epinephrine.  After creating a small venotomy incision, a micropuncture kit was utilized to access the internal jugular vein under direct, real-time ultrasound guidance. Ultrasound image documentation was performed. The microwire was kinked to measure appropriate catheter length.  A subcutaneous port pocket was then created along the upper chest wall utilizing a combination of sharp and blunt dissection. The pocket was irrigated with  sterile saline. A single lumen ISP power injectable port was chosen for placement. The 8 Fr catheter was tunneled from the port pocket site to the venotomy incision. The port was placed in the pocket. The external catheter was trimmed to appropriate length. At the venotomy, an 8 Fr  peel-away sheath was placed over a guidewire under fluoroscopic guidance. The catheter was then placed through the sheath and the sheath was removed. Final catheter positioning was confirmed and documented with a fluoroscopic spot radiograph. The port was accessed with a Huber needle, aspirated and flushed with heparinized saline.  The venotomy site was closed with an interrupted 4-0 Vicryl suture. The port pocket incision was closed with interrupted 2-0 Vicryl suture and the skin was opposed with a running subcuticular 4-0 Vicryl suture. Dermabond and Steri-strips were applied to both incisions. Dressings were placed. The patient tolerated the procedure well without immediate post procedural complication.  COMPLICATIONS: None immediate  FINDINGS: After catheter placement, the tip lies within the superior cavoatrial junction. The catheter aspirates and flushes normally and is ready for immediate use.  IMPRESSION: Successful placement of a right internal jugular approach power injectable Port-A-Cath. The catheter is ready for immediate use.   Electronically Signed   By: Sandi Mariscal M.D.   On: 08/21/2013 16:05   Ir US Guide Vasc Access Right  08/21/2013   INDICATION: History of multiple myeloma, in need of intravenous access for the initiation of chemotherapy.  EXAM: IMPLANTED PORT A CATH PLACEMENT WITH ULTRASOUND AND FLUOROSCOPIC GUIDANCE  MEDICATIONS: Vancomycin 1 g IV; IV antibiotic was given in an appropriate time interval prior to skin puncture.  ANESTHESIA/SEDATION: Versed 3 mg IV; Fentanyl 100 mcg IV;  Total Moderate Sedation Time  30  minutes.  CONTRAST:  None  COMPARISON:  None.  FLUOROSCOPY TIME:  48 seconds.  PROCEDURE: The procedure, risks, benefits, and alternatives were explained to the patient. Questions regarding the procedure were encouraged and answered. The patient understands and consents to the procedure.  The right neck and chest were prepped with chlorhexidine in a sterile fashion, and a  sterile drape was applied covering the operative field. Maximum barrier sterile technique with sterile gowns and gloves were used for the procedure. A timeout was performed prior to the initiation of the procedure. Local anesthesia was provided with 1% lidocaine with epinephrine.  After creating a small venotomy incision, a micropuncture kit was utilized to access the internal jugular vein under direct, real-time ultrasound guidance. Ultrasound image documentation was performed. The microwire was kinked to measure appropriate catheter length.  A subcutaneous port pocket was then created along the upper chest wall utilizing a combination of sharp and blunt dissection. The pocket was irrigated with sterile saline. A single lumen ISP power injectable port was chosen for placement. The 8 Fr catheter was tunneled from the port pocket site to the venotomy incision. The port was placed in the pocket. The external catheter was trimmed to appropriate length. At the venotomy, an 8 Fr peel-away sheath was placed over a guidewire under fluoroscopic guidance. The catheter was then placed through the sheath and the sheath was removed. Final catheter positioning was confirmed and documented with a fluoroscopic spot radiograph. The port was accessed with a Huber needle, aspirated and flushed with heparinized saline.  The venotomy site was closed with an interrupted 4-0 Vicryl suture. The port pocket incision was closed with interrupted 2-0 Vicryl suture and the skin was opposed with a running subcuticular 4-0 Vicryl suture. Dermabond and Steri-strips were  applied to both incisions. Dressings were placed. The patient tolerated the procedure well without immediate post procedural complication.  COMPLICATIONS: None immediate  FINDINGS: After catheter placement, the tip lies within the superior cavoatrial junction. The catheter aspirates and flushes normally and is ready for immediate use.  IMPRESSION: Successful placement of a right  internal jugular approach power injectable Port-A-Cath. The catheter is ready for immediate use.   Electronically Signed   By: Sandi Mariscal M.D.   On: 08/21/2013 16:05   Dg Chest Port 1 View  09/10/2013   CLINICAL DATA:  Cough.  EXAM: PORTABLE CHEST - 1 VIEW  COMPARISON:  08/18/2013  FINDINGS: Right Port-A-Cath is in place, unchanged. Low lung volumes. Mild peribronchial thickening and interstitial prominence, similar to prior study. No confluent airspace opacities or effusions. Heart is normal size.  IMPRESSION: Low lung volumes with persistent peribronchial thickening and interstitial prominence, likely bronchitis. No real change.   Electronically Signed   By: Rolm Baptise M.D.   On: 09/10/2013 08:25   Portable Chest 1 View  09/04/2013   CLINICAL DATA:  Cough, weakness, recent pneumonia, history pulmonary embolism, smoking, multiple myeloma  EXAM: PORTABLE CHEST - 1 VIEW  COMPARISON:  Portable exam 1958 hr compared to 09/02/2013  FINDINGS: Right jugular Port-A-Cath tip projects over cavoatrial junction.  Upper normal heart size, accentuated by hypoinflation.  Low lung volumes with right basilar atelectasis and crowding of perihilar markings.  Minimal peribronchial thickening.  No definite infiltrate, pleural effusion or pneumothorax.  Prior spinal augmentation procedures at 4 contiguous levels of the thoracolumbar spine.  IMPRESSION: Bronchitic changes with basilar hypoinflation and right basilar atelectasis.   Electronically Signed   By: Lavonia Dana M.D.   On: 08/20/2013 20:40   Dg Chest Port 1 View  09/02/2013   CLINICAL DATA:  Acute respiratory failure. Pneumonia. Pulmonary edema. Multiple myeloma.  EXAM: PORTABLE CHEST - 1 VIEW  COMPARISON:  08/31/2013  FINDINGS: Decrease and bilateral airspace disease is seen compared to previous study. Low lung volumes are again noted. No evidence of pleural effusion. Heart size is normal. Right-sided power port remains in appropriate position.  IMPRESSION: Continued  improvement in bilateral airspace disease.   Electronically Signed   By: Earle Gell M.D.   On: 09/02/2013 07:28   Dg Chest Port 1 View  08/31/2013   CLINICAL DATA:  Pulmonary infiltrates. Cough and shortness of breath.  EXAM: PORTABLE CHEST - 1 VIEW  COMPARISON:  08/29/2013, 08/28/2013, and 08/25/2013 and chest CT dated 08/26/2013  FINDINGS: Power port is in place unchanged. NG tube and endotracheal tube a been removed. There is significant partial clearing of the bilateral pulmonary infiltrates. Heart size and vascularity are normal. No visible effusions.  IMPRESSION: Partial clearing of the extensive bilateral pulmonary infiltrates.   Electronically Signed   By: Rozetta Nunnery M.D.   On: 08/31/2013 13:32   Dg Chest Port 1 View  08/29/2013   CLINICAL DATA:  Respiratory failure.  EXAM: PORTABLE CHEST - 1 VIEW  COMPARISON:  Single view of the chest 08/28/2013 and 08/27/2013.  FINDINGS: Support tubes and lines are unchanged. Extensive bilateral airspace disease persists without marked change. There is no pneumothorax or pleural effusion. Heart size is normal. Multilevel vertebral augmentation noted.  IMPRESSION: No change in extensive bilateral airspace disease.   Electronically Signed   By: Inge Rise M.D.   On: 08/29/2013 07:31   Dg Chest Port 1 View  08/28/2013   CLINICAL DATA:  Intubation.  EXAM: PORTABLE CHEST -  1 VIEW  COMPARISON:  08/27/2013.  FINDINGS: Endotracheal tube tip noted 2.3 cm above the carina. Central line in stable position. NG tube tip below the left hemidiaphragm. Bilateral dense pulmonary alveolar infiltrates are present and unchanged. No pleural effusion or pneumothorax. Heart size is normal. No acute osseous abnormality.  IMPRESSION: 1.  Stable line and tube positions.  2. Bilateral dense unchanged pulmonary alveolar infiltrates. This can't be seen with pulmonary edema, ARDS, and /or bilateral pneumonia .   Electronically Signed   By: Marcello Moores  Register   On: 08/28/2013 07:29    Portable Chest Xray  08/27/2013   CLINICAL DATA:  Acute respiratory failure. On ventilator. Healthcare associated pneumonia. Multiple myeloma.  EXAM: PORTABLE CHEST - 1 VIEW  COMPARISON:  08/25/2013  FINDINGS: Right-sided power port remains in place. New endotracheal tube is seen with tip approximately 2.5 cm above the carina.  Significant interval worsening of diffuse bilateral airspace disease is seen since previous study. Low lung volumes again noted. No evidence of pneumothorax or pleural effusion. Heart size is normal.  IMPRESSION: Endotracheal tube in appropriate position. Significant worsening of diffuse bilateral airspace disease.   Electronically Signed   By: Earle Gell M.D.   On: 08/27/2013 11:58      IMPRESSION/PLAN: This is a lovely 73 year old gentleman with relapsed multiple myeloma. 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 I recommend the following  #1 I will call Dr. Marin Olp to see if a T spine MRI can be conducted  while the patient is in the hospital to verify the extent of significant epidural involvement above what was detected on his lumbar spine MRI.  #2 continue Decadron and pain management as currently planned  #3 I scheduled a simulation for the patient this afternoon to treat his cord compression and adjacent epidural tumor. Although his pain on physical exam appears to be in the mid lumbar region, my best hypothesis is that this is radiating from disease in the low thoracic spine. The lumbar regions appear comparably unremarkable on his MRI. I plan to treat from roughly T6 through L2.  I may need to extend his field superiorly if impressive findings are revealed on a T spine MRI.  The risks benefits and side effects of thoracic lumbar radiation were discussed in detail w/ the patient. He understands increased risks of late side effects due to prior exposure to radiotherapy. The most  likely acute side effects would be some GI upset or esophagitis, fatigue and possible skin irritation. The risk of permanent injury to internal organs was discussed as well, including the risk of spinal cord injury. Consent form has been signed and placed in his chart. He is enthusiastic to proceed with treatment. I aim to begin his radiotherapy early this evening in light of the cord compression, and deliver a total of 10 fractions.  I spent 30 minutes  face to face with the patient and more than 50% of that time was spent in counseling and/or coordination of care.   __________________________________________   Eppie Gibson, MD

## 2013-09-12 NOTE — Telephone Encounter (Signed)
Robert Berger canceled his appointment today after conferring with Dr. Ennever And decided he did not need any radiation therapy because he is not having any pain, and he knows that having treatment will not "increase his life expectancy." He stated that if he begins to have pain he will notify Dr. Ennever for a referral back to Radiation Therapy.       

## 2013-09-12 NOTE — Progress Notes (Signed)
Simulation Verification Note Inpatient Treating T6-L3 spine  The patient was brought to the treatment unit and placed in the planned treatment position. The clinical setup was verified. Then port films were obtained and uploaded to the radiation oncology medical record software.  The treatment beams were carefully compared against the planned radiation fields. The position location and shape of the radiation fields was reviewed. They targeted volume of tissue appears to be appropriately covered by the radiation beams. Organs at risk appear to be excluded as planned.  Based on my personal review, I approved the simulation verification. The patient's first fraction was delivered as planned.  -----------------------------------  Eppie Gibson, MD

## 2013-09-12 NOTE — Care Management Note (Signed)
Cm continues to follow concerning discharge needs. 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 Per physician awaiting results from a diagnostic test prior to ordering the chemo. Cm spoke with patient concerning PT recommendations for HHPT vs SNF. Pt states previously at [facility]. Pt informed of process concerning SNF placement with payer. Will continue to follow.     Venita Lick Jamika Sadek,MSN,RN [phone]

## 2013-09-12 NOTE — Progress Notes (Signed)
Error:  Documented note at 11:59am.  Note not related to Mr. Stuard

## 2013-09-12 NOTE — Progress Notes (Signed)
  Radiation Oncology         [phone] ________________________________  Name: Robert Berger MRN: [id number]  Date: 08/27/2013  DOB: Aug 31, 1940  SIMULATION AND TREATMENT PLANNING NOTE  Inpatient  DIAGNOSIS:  relapsed spine myeloma  NARRATIVE:  The patient was brought to the [facility].  Identity was confirmed.  All relevant records and images related to the planned course of therapy were reviewed.  The patient freely provided informed written consent to proceed with treatment after reviewing the details related to the planned course of therapy. The consent form was witnessed and verified by the simulation staff.    Then, the patient was set-up in a stable reproducible  prone position for radiation therapy.  CT images were obtained.  Surface markings were placed.  The CT images were loaded into the planning software.    TREATMENT PLANNING NOTE: Treatment planning then occurred.  The radiation prescription was entered and confirmed.    A total of 3 medically necessary complex treatment devices were fabricated and supervised by me- AP/PA fields with MLCs to block kidneys, liver, bowel, lung, heart and a VAC LOC. I have requested : 3D Simulation;  I have requested a DVH of the following structures: kidneys, cord, lungs, heart.   The patient will receive 22.5 Gy in 10 fractions. Preliminary intention is to treat from T6-L3.  Special Treatment Procedure Note: The patient received prior radiotherapy to his spine. There will be some overlap of radiation dose.  Prior regional radiotherapy increases the risk of side effects from treatment. I have considered this in the treatment planning process.  This increases the complexity of this patient's treatment and therefore this constitutes a special treatment procedure.  -----------------------------------  Eppie Gibson, MD

## 2013-09-12 NOTE — Progress Notes (Signed)
Mr. Hellenbrand his MRI last night. I have reviewed the report. There does seem to be compression at T10. He has no neurological issues.  Decadron was started last night.  We will see if radiation oncology can help out with any additional radiation therapy.  I suspect that we're going to need to increase the "intensity" of the chemotherapy. He is behaving like plasma cell leukemia. As such, I think we are going to need to try a regimen that is almost leukemic-like.  His had good pain control. He is out of bed a little bit. We are will awaiting the back brace to be brought. Once that is brought, then we can move ahead with physical therapy.  His appetite still pretty well.  Is sleeping well.  His cough is about the same. It is nonproductive. He does get some nebulizers which have helped a little bit.  His vital signs are stable. Blood pressure 119/64. Temperature is 97.8. His lungs are clear. Oral exam shows no thrush. Cardiac exam regular rate and rhythm. Abdomen is soft. Has good bowel sounds. There is no palpable hepatospleno megaly. Extremities shows 4+/5 strength in both legs. He has a little swelling in the lower legs. He has decent range of motion of his joints. Neurological exam shows no focal deficits.  No labs were done today.  We will see about radiation therapy now. Again, I appreciate the efforts of Dr. Alvy Bimler and radiation oncology.Marland Kitchen  He is clearly not ready for discharge. I don't expect discharge for him now for a least 7-10 days.  Robert E.  Hebrews 12:12

## 2013-09-13 ENCOUNTER — Other Ambulatory Visit: Payer: BC Managed Care – PPO | Admitting: Lab

## 2013-09-13 ENCOUNTER — Ambulatory Visit
Admit: 2013-09-13 | Discharge: 2013-09-13 | Disposition: A | Discharge status: Home / Self Care | Payer: BC Managed Care – PPO | Attending: Radiation Oncology | Admitting: Radiation Oncology

## 2013-09-13 ENCOUNTER — Ambulatory Visit: Payer: BC Managed Care – PPO | Admitting: Hematology & Oncology

## 2013-09-13 ENCOUNTER — Ambulatory Visit: Payer: BC Managed Care – PPO

## 2013-09-13 LAB — COMPREHENSIVE METABOLIC PANEL
ALT: 68 U/L — AB (ref 0–53)
AST: 91 U/L — ABNORMAL HIGH (ref 0–37)
Albumin: 2.1 g/dL — ABNORMAL LOW (ref 3.5–5.2)
Alkaline Phosphatase: 347 U/L — ABNORMAL HIGH (ref 39–117)
BUN: 11 mg/dL (ref 6–23)
CO2: 26 meq/L (ref 19–32)
CREATININE: 0.77 mg/dL (ref 0.50–1.35)
Calcium: 7.8 mg/dL — ABNORMAL LOW (ref 8.4–10.5)
Chloride: 105 mEq/L (ref 96–112)
GFR, EST NON AFRICAN AMERICAN: 89 mL/min — AB (ref >90)
Glucose, Bld: 173 mg/dL — ABNORMAL HIGH (ref 70–99)
Potassium: 3.8 mEq/L (ref 3.7–5.3)
SODIUM: 136 meq/L — AB (ref 137–147)
Total Bilirubin: 2.2 mg/dL — ABNORMAL HIGH (ref 0.3–1.2)
Total Protein: 8.9 g/dL — ABNORMAL HIGH (ref 6.0–8.3)

## 2013-09-13 LAB — PREALBUMIN: Prealbumin: 11.2 mg/dL — ABNORMAL LOW (ref 17.0–34.0)

## 2013-09-13 LAB — CBC
HEMATOCRIT: 23.5 % — AB (ref 39.0–52.0)
Hemoglobin: 7.6 g/dL — ABNORMAL LOW (ref 13.0–17.0)
MCH: 29.6 pg (ref 26.0–34.0)
MCHC: 32.3 g/dL (ref 30.0–36.0)
MCV: 91.4 fL (ref 78.0–100.0)
Platelets: 37 10*3/uL — ABNORMAL LOW (ref 150–400)
RBC: 2.57 MIL/uL — AB (ref 4.22–5.81)
RDW: 19.8 % — ABNORMAL HIGH (ref 11.5–15.5)
WBC: 17.8 10*3/uL — ABNORMAL HIGH (ref 4.0–10.5)

## 2013-09-13 LAB — GLUCOSE, CAPILLARY: Glucose-Capillary: 150 mg/dL — ABNORMAL HIGH (ref 70–99)

## 2013-09-13 MED ORDER — IPRATROPIUM-ALBUTEROL 0.5-2.5 (3) MG/3ML IN SOLN
3.0000 mL | Freq: Two times a day (BID) | RESPIRATORY_TRACT | Status: DC
  Administered 2013-09-13 – 2013-09-17 (×8): 3 mL via RESPIRATORY_TRACT
  Filled 2013-09-13 (×8): qty 3

## 2013-09-13 MED ORDER — BOOST / RESOURCE BREEZE PO LIQD
1.0000 | Freq: Three times a day (TID) | ORAL | Status: DC
  Administered 2013-09-13 – 2013-09-14 (×2): 1 via ORAL

## 2013-09-13 NOTE — Progress Notes (Signed)
PT Cancellation Note  Patient Details Name: Robert Berger MRN: [id number] DOB: 05-03-1941   Cancelled Treatment:    Reason Eval/Treat Not Completed: Patient at procedure or test/unavailable (also would like MD to advise if any mobility  limitatations or need for more supportive back brace given new MRI results.)   Claretha Cooper 09/13/2013, 3:56 PM Tresa Endo PT [phone]

## 2013-09-13 NOTE — Progress Notes (Signed)
[place]  Telephone:([phone]) [place] NOTE  Events since  2/4   Noted. No new events overnight. Pain well controlled, no respiratory or cardiac complaints. On decadron day 3. Appetite adequate.     MEDICATIONS:  Scheduled Meds: . acyclovir  400 mg Oral Daily  . dexamethasone  10 mg Intravenous Q12H  . dronabinol  2.5 mg Oral TID AC  . fentaNYL  100 mcg Transdermal Q72H  . ipratropium-albuterol  3 mL Nebulization TID  . metoprolol tartrate  25 mg Oral BID  . pantoprazole  40 mg Oral Daily   Continuous Infusions: . sodium chloride 50 mL/hr at 09/12/13 1414   PRN Meds:.benzonatate, HYDROmorphone, HYDROmorphone, lip balm, LORazepam, prochlorperazine ALLERGIES:   Allergies  Allergen Reactions  . Penicillins Nausea And Vomiting, Swelling and Rash     PHYSICAL EXAMINATION:   Filed Vitals:   09/13/13 0556  BP: 126/72  Pulse: 79  Temp: 97.7 F (36.5 C)  Resp: 18   Filed Weights   08/25/2013 1744 09/09/13 0523  Weight: 186 lb 6.4 oz (84.55 kg) 193 lb 8 oz (87.48 kg)    73 year old  in no acute distress A. and O. x3 General well-developed and well-nourished  HEENT: Normocephalic, atraumatic, PERRLA. Oral cavity without thrush or lesions. Neck supple. no thyromegaly, no cervical or supraclavicular adenopathy  Lungs clear bilaterally . No wheezing, rhonchi or rales. Cardiac: regular rate and rhythm,no murmur , rubs or gallops Abdomen soft nontender , bowel sounds x4. No HSM Extremities no clubbing cyanosis or edema. No bruising or petechial rash Neuro: non focal  LABORATORY/RADIOLOGY DATA:   Recent Labs Lab 09/06/2013 1735 09/09/13 0455 09/11/13 0455 09/13/13 0510  WBC 13.3* 11.0* 12.4* 17.8*  HGB 7.2* 8.4* 7.8* 7.6*  HCT 22.1* 25.9* 24.0* 23.5*  PLT 58* 45* 42* 37*  MCV 93.6 91.2 91.6 91.4  MCH 30.5 29.6 29.8 29.6  MCHC 32.6 32.4 32.5 32.3  RDW 19.8* 20.1* 19.9* 19.8*    CMP    Recent Labs Lab 09/06/2013 1735  09/09/13 0455 09/11/13 0455 09/13/13 0510  NA 136* 141 137 136*  K 4.3 3.7 3.8 3.8  CL 100 107 106 105  CO2 $Re'28 28 26 26  'bfo$ GLUCOSE 156* 94 117* 173*  BUN $Re'15 11 8 11  'gjt$ CREATININE 0.74 0.84 0.87 0.77  CALCIUM 8.8 7.7* 7.5* 7.8*  MG 2.1  --   --   --   AST 40* 40* 29 91*  ALT 41 37 25 68*  ALKPHOS 195* 155* 136* 347*  BILITOT 0.6 0.5 0.7 2.2*        Component Value Date/Time   BILITOT 2.2* 09/13/2013 0510   BILITOT 0.63 08/23/2013 1229   BILITOT 0.70 08/13/2013 1317   BILIDIR 0.9* 03/18/2013 2339   IBILI 0.5 03/18/2013 2339    Anemia panel:   No results found for this basename: VITAMINB12, FOLATE, FERRITIN, TIBC, IRON, RETICCTPCT,  in the last 72 hours  No results found for this basename: TSH, T4TOTAL, FREET3, T3FREE, THYROIDAB,  in the last 72 hours   No results found for this basename: esrsedrate     Recent Labs Lab 09/06/2013 1735  INR 1.08      Urinalysis    Component Value Date/Time   COLORURINE AMBER* 08/20/2013 0204   APPEARANCEUR CLOUDY* 08/20/2013 0204   LABSPEC 1.026 08/20/2013 0204   PHURINE 5.5 08/20/2013 0204   GLUCOSEU 100* 08/20/2013 0204   HGBUR NEGATIVE 08/20/2013 [place] 08/20/2013  [place] 08/20/2013 0204   PROTEINUR 100* 08/20/2013 0204   UROBILINOGEN 1.0 08/20/2013 0204   NITRITE NEGATIVE 08/20/2013 0204   LEUKOCYTESUR NEGATIVE 08/20/2013 0204    Drugs of Abuse  No results found for this basename: labopia, cocainscrnur, labbenz, amphetmu, thcu, labbarb     Liver Function Tests:  Recent Labs Lab 08/20/2013 1735 09/09/13 0455 09/11/13 0455 09/13/13 0510  AST 40* 40* 29 91*  ALT 41 37 25 68*  ALKPHOS 195* 155* 136* 347*  BILITOT 0.6 0.5 0.7 2.2*  PROT 9.6* 8.7* 8.8* 8.9*  ALBUMIN 2.5* 2.2* 2.1* 2.1*   No results found for this basename: LIPASE, AMYLASE,  in the last 168 hours No results found for this basename: AMMONIA,  in the last 168 hours  CBG:  Recent Labs Lab 09/10/13 0727 09/11/13 0744  09/11/13 1150 09/12/13 0804 09/13/13 0805  GLUCAP 84 97 108* 145* 150*   Hgb A1c No results found for this basename: HGBA1C,  in the last 72 hours  Cardiac Enzymes: No results found for this basename: CKTOTAL, CKMB, CKMBINDEX, TROPONINI,  in the last 168 hours  BNP: No components found with this basename: POCBNP,   D-Dimer No results found for this basename: DDIMER,  in the last 72 hours  Thyroid function studies No results found for this basename: TSH, T4TOTAL, FREET3, T3FREE, THYROIDAB,  in the last 72 hours  Radiology Studies:  Dg Chest 2 View  08/25/2013   CLINICAL DATA:  Hypoxia, dyspnea, and chest discomfort with history of multiple myeloma and pulmonary embolism. Marland Kitchen  EXAM: CHEST  2 VIEW  COMPARISON:  CT scan of the chest dated August 20, 2013.  FINDINGS: Since the previous study there have developed fluffy alveolar infiltrates bilaterally. The cardiopericardial silhouette does not appear enlarged. There is no pleural effusion. The mediastinum is normal in width. The Port-A-Cath appliance is unchanged in appearance. The patient has undergone kyphoplasty at the T10 through L1 levels.  IMPRESSION: 1. New fluffy alveolar densities in both lungs are worrisome for pneumonia. CHF is not problematic Lee excluded. 2. There is no evidence of a pleural effusion. 3. The patient has known paravertebral soft tissue masses consistent with myeloma. These are not clearly evident on today's study.   Electronically Signed   By: David  Martinique   On: 08/25/2013 13:26   Dg Lumbar Spine 2-3 Views  09/01/2013   CLINICAL DATA:  Low back pain.  Multiple myeloma.  EXAM: LUMBAR SPINE - 2-3 VIEW  COMPARISON:  Lumbar spine MRI on 07/07/2013  FINDINGS: No evidence of acute lumbar spine fracture or subluxation. 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 Previous vertebroplasty also noted at L3. Mild degenerative disc disease is  seen at all lumbar levels. No new lytic or destructive bone lesions visualized.  IMPRESSION: No acute findings.  Mild multilevel degenerative disc disease.  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   Electronically Signed   By: Earle Gell M.D.   On: 09/01/2013 15:12   Ct Angio Chest Pe W/cm &/or Wo Cm  08/26/2013   CLINICAL DATA:  Unexplained persistent dyspnea and tachycardia, history of multiple myeloma and previous history of pulmonary embolism.  EXAM: CT ANGIOGRAPHY CHEST WITH CONTRAST  TECHNIQUE: Multidetector CT imaging of the chest was performed using the standard protocol during bolus administration of intravenous contrast. Multiplanar CT image reconstructions including MIPs were obtained to evaluate the vascular anatomy.  CONTRAST:  156mL OMNIPAQUE IOHEXOL 350 MG/ML SOLN  COMPARISON:  PA and lateral chest x-ray of February 22, 2014 and CT scan of the chest dated August 20, 2013  FINDINGS: Contrast within the pulmonary arterial tree is normal in appearance. There are no filling defects to suggest an acute pulmonary embolism. Review of the MIP images confirms the above findings. 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 The caliber of the thoracic aorta is normal. Since the previous study small bilateral pleural effusions have developed. There is no pericardial effusion. The cardiac chambers are normal in size. There is no bulky mediastinal nor hilar lymphadenopathy. There is chronic enlargement of the left thyroid lobe. Again demonstrated is abnormal soft tissue density in the left paravertebral region at approximately T8 and T9.  At lung window settings there has been interval development of fluffy partially confluent bilateral alveolar infiltrates. The upper lobes appear to be the most conspicuously involved.  Known skeletal involvement by the patient's multiple myeloma is again demonstrated.  Within the upper abdomen the observed portions of the liver appear  normal. There are tiny radiodensities associated with the dependent portion of the gallbladder near the neck. The spleen is subjectively enlarged.  IMPRESSION: 1. There are new fluffy bilateral widespread pulmonary alveolar infiltrates consistent with pneumonia or pneumonitis. 2. There are new small bilateral pleural effusions layering posteriorly. 3. Stable findings of known skeletal and paravertebral involvement by the patient's known myeloma are demonstrated. 4. There is no evidence of an acute pulmonary embolism.   Electronically Signed   By: David  Martinique   On: 08/26/2013 17:07   Ct Angio Chest Pe W/cm &/or Wo Cm  08/20/2013   CLINICAL DATA:  Shortness of breath and fatigue. History of DVT. Multiple myeloma.  EXAM: CT ANGIOGRAPHY CHEST WITH CONTRAST  TECHNIQUE: Multidetector CT imaging of the chest was performed using the standard protocol during bolus administration of intravenous contrast. Multiplanar CT image reconstructions including MIPs were obtained to evaluate the vascular anatomy.  CONTRAST:  157mL OMNIPAQUE IOHEXOL 350 MG/ML SOLN  COMPARISON:  05/13/2012  FINDINGS: THORACIC INLET/BODY WALL:  Enlargement of the left lobe thyroid gland with coarse calcifications, stable from 2013.  MEDIASTINUM:  Normal heart size. No pericardial effusion. Multi focal coronary artery atherosclerosis. No acute vascular abnormality, including pulmonary embolism or aortic dissection. No adenopathy.  LUNG WINDOWS:  No consolidation.  No effusion.  No suspicious pulmonary nodule.  UPPER ABDOMEN:  No acute findings.  OSSEOUS:  There are scattered lytic lesions that the imaged skeleton, consistent with myeloma. No acute fracture appreciated. The most notable findings in the thoracic spine is extensive osteolysis of the T9, T10, and T11 bodies, encompassing previously placed bone cement. There is increasing paravertebral soft tissue mass, bilaterally T9-10 and on the left at T8. The nodule at the level of T8 measures 18 mm.  Although anemic, the hemo below levels are not as low as would be expected for extramedullary hematopoiesis. Additionally, the paraspinal abnormalities are limited to levels with definite myeloma this involvement of the bone. Question new growth into the posterior canal at the level of T10. No acute fracture appreciated. Other notable lytic areas within the manubrium and T3 body.  Review of the MIP images confirms the above findings.  IMPRESSION: 1. Negative for pulmonary embolism or other acute intrathoracic abnormality. 2. Multiple myeloma. Compared to thoracic spine MRI 07/27/2013, new/larger paravertebral extension at the level of T8 and T9/10. If there are new thoracic cord symptoms, MRI could re-evaluate the spinal canal  at T10.   Electronically Signed   By: Jorje Guild M.D.   On: 08/20/2013 03:41   Ct Lumbar Spine W Contrast  08/09/2013   CLINICAL DATA:  Severe back pain and history of myeloma. Unable to have MRI.  EXAM: CT LUMBAR SPINE WITH CONTRAST  TECHNIQUE: Multidetector CT imaging of the lumbar spine was performed with intravenous contrast administration. Multiplanar CT image reconstructions were also generated.  CONTRAST:  181mL OMNIPAQUE IOHEXOL 300 MG/ML  SOLN  COMPARISON:  Abdominal CT 03/19/2013  FINDINGS: Myeloma lesions present throughout the sacrum and lumbar spine are better seen on previous MR imaging. The largest deposits, located in the anteroinferior L5 body and in the S1 body are largely inconspicuous by CT. Extra osseous extension of the S1/S2 body lesion is only partly imaged currently, although there is clear infiltration of the left anterior sacral foramina at S1, infiltrating the perineural fat. There is no evidence of other areas of progressive extra osseous extension or canal/foraminal compromise.  No evidence of interval fracture. Unchanged T12 body height loss, where there was previous extensive comminuted fracturing, status post bone augmentation. Bone augmentation was  also performed in the T11 and L3 bodies.  There is diffuse spondylotic spurring of the posterior endplates, narrowing the ventral canal at L1-2, L2-3, L3-4, L4-5. Disc narrowing and endplate spurring crowds the bilateral foramina, predominately at L3-4 and L4-5. Degenerative changes are stable from prior.  IMPRESSION: 1. No acute findings, including fracture. 2. 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 Reference lumbar spine MRI 07/07/2013.   Electronically Signed   By: Jorje Guild M.D.   On: 09/03/2013 22:10   Mr Thoracic Spine Wo Contrast  09/13/2013   CLINICAL DATA:  History of multiple myeloma, evaluate for cord compression  EXAM: MRI THORACIC SPINE WITHOUT CONTRAST  TECHNIQUE: Multiplanar, multisequence MR imaging was performed. No intravenous contrast was administered. Please note that the patient was unable to tolerate the length of time for the scan, and did not receive IV contrast.  COMPARISON:  Prior MRI from 07/27/2013  FINDINGS: The vertebral bodies are normally aligned with preservation of the normal thoracic kyphosis. Diffusely flowing osteophytes again seen fusing the thoracic spine from T3 through T11. Marked did disc height loss at T5-6 through T10-11 again seen, unchanged.  Bone marrow signal intensity is diffusely abnormal, compatible with history of multiple myeloma. Again, there are multiple lesions within the thoracic spine involving the T2, T5, T6, T9, T10, T11 as well as the L1, and L2 vertebral bodies. Sequela of prior vertebroplasty again seen at T9 through T12  Remote pathologic fracture of T12 with associated 5 mm of retropulsion is unchanged. No new compression fracture identified. There is new extra-axial is soft tissue tumor within the epidural space at the level of T10 (series 15, image 30). Tumor involves both the right and left ventral aspects of the thecal sac as well as the left posterior aspect of  the thecal sac, medial to the left lamina of T10. There is resultant moderate canal stenosis at this level. The thoracic spinal cord itself demonstrates normal signal intensity at this level. The posterior elements from essentially the T8 through on the L1 levels are abnormal with osseous expansion due to tumor. In addition, there is increased extra osseous soft tissue density about the T9, and T10 vertebral bodies, compatible with worsening tumor burden. This is most evident at T10 (series 15, image 31) the posterior  ribs are involved at these levels as well.  Right paracentral disc protrusion at T12-L1 is noted indenting the ventral thecal sac without significant canal or foraminal stenosis.  Moderate bilateral pleural effusions are present. Paraspinous soft tissues are within normal limits.  IMPRESSION: 1. Interval worsening of disease with new extraosseous tumor involving the epidural space at the level of T10 with associated moderate central canal stenosis. No other areas of cord compression identified. 2. Moderate bilateral pleural effusions. 3. Right paracentral disc protrusion at T12-L1 without significant canal or foraminal stenosis. Critical Value/emergent results were called by telephone at the time of interpretation on 09/13/2013 at 12:06 AM to Dr. Ruffin Pyo, who verbally acknowledged these results.   Electronically Signed   By: Jeannine Boga M.D.   On: 09/13/2013 00:11   Mr Lumbar Spine W Wo Contrast  09/11/2013   ADDENDUM REPORT: 09/11/2013 20:50  ADDENDUM: RN Bary Richard on [facility] advised of the Critical Value/emergent results by telephone on 09/11/2013 at 8:49 PM.   Electronically Signed   By: Lars Pinks M.D.   On: 09/11/2013 20:50   09/11/2013   CLINICAL DATA:  73 year old male with severe low back pain. Initial encounter. Multiple myeloma status post chemotherapy and radiation. History of pathologic fractures status post augmentation.  EXAM: MRI LUMBAR SPINE WITHOUT AND WITH CONTRAST   TECHNIQUE: Multiplanar and multiecho pulse sequences of the lumbar spine were obtained without and with intravenous contrast.  CONTRAST:  74mL MULTIHANCE GADOBENATE DIMEGLUMINE 529 MG/ML IV SOLN  COMPARISON:  CT lumbar spine 08/19/2013. Lumbar MRI 07/07/2013 and CTA chest 08/26/2013.  FINDINGS: Widespread loss of the remaining normal bone marrow signal in the visible spine and pelvis. Only minimal residual fatty marrow in the L4 vertebra (compare series 4 today with series 3 on 07/07/2013). Previous vertebral body augmentation at T9, T10, T11, and T12. There does appear to be mild residual marrow edema and enhancement at T9 and T10, in areas which were highly lytic on 08/26/2013.  Interval regressed central sacral tumor at S1. Subsequently, improved patency of the epidural space at the left S1 and S2 nerve root/nerve levels. There is residual signal abnormality along the course of the left S1 nerve.  However, there is epidural tumor throughout the visible thoracic levels T8 - T11. Positive for mild cord compression at the T10 level (series 9, image 8). No definite associated spinal cord signal abnormality. Epidural tumor here is contiguous with Mild lumbar dural thickening and increased enhancement which tracks caudally to the L1 level. Superimposed lateral paraspinal tumor extension most pronounced at T10 and greater on the left (series 8, image 7).  The conus medullaris appears normal at L1. No abnormal intradural enhancement identified.  No lumbar epidural tumor.  Lumbar degenerative changes are stable.  Stable visualized abdominal viscera.  IMPRESSION: 1. Interval loss of residual normal bone marrow signal throughout the visible spine and pelvis. Burtis Junes this reflects interval progression of myelomatous involvement. Myeloma with superimposed treatment effect (red marrow reactivation) is felt less likely. 2. Visible lower thoracic spine positive for diffuse involvement with epidural and paraspinal tumor. There is  cord compression without spinal cord edema at T10 (see series 9, image 8). 3. At the same time, regressed central sacral tumor, and improved appearance of the course of the left S1 and S2 nerves. 4. Suspect upper lumbar early dural involvement by tumor, but no lumbar epidural tumor or malignant spinal stenosis.  Dr. Burney Gauze was paged regarding the above Critical Value/emergent results on 09/11/2013 at  6:52PM.  Electronically Signed: By: Lars Pinks M.D. On: 09/11/2013 19:02   Ir Fluoro Guide Cv Line Right  08/21/2013   INDICATION: History of multiple myeloma, in need of intravenous access for the initiation of chemotherapy.  EXAM: IMPLANTED PORT A CATH PLACEMENT WITH ULTRASOUND AND FLUOROSCOPIC GUIDANCE  MEDICATIONS: Vancomycin 1 g IV; IV antibiotic was given in an appropriate time interval prior to skin puncture.  ANESTHESIA/SEDATION: Versed 3 mg IV; Fentanyl 100 mcg IV;  Total Moderate Sedation Time  30  minutes.  CONTRAST:  None  COMPARISON:  None.  FLUOROSCOPY TIME:  48 seconds.  PROCEDURE: The procedure, risks, benefits, and alternatives were explained to the patient. Questions regarding the procedure were encouraged and answered. The patient understands and consents to the procedure.  The right neck and chest were prepped with chlorhexidine in a sterile fashion, and a sterile drape was applied covering the operative field. Maximum barrier sterile technique with sterile gowns and gloves were used for the procedure. A timeout was performed prior to the initiation of the procedure. Local anesthesia was provided with 1% lidocaine with epinephrine.  After creating a small venotomy incision, a micropuncture kit was utilized to access the internal jugular vein under direct, real-time ultrasound guidance. Ultrasound image documentation was performed. The microwire was kinked to measure appropriate catheter length.  A subcutaneous port pocket was then created along the upper chest wall utilizing a combination of  sharp and blunt dissection. The pocket was irrigated with sterile saline. A single lumen ISP power injectable port was chosen for placement. The 8 Fr catheter was tunneled from the port pocket site to the venotomy incision. The port was placed in the pocket. The external catheter was trimmed to appropriate length. At the venotomy, an 8 Fr peel-away sheath was placed over a guidewire under fluoroscopic guidance. The catheter was then placed through the sheath and the sheath was removed. Final catheter positioning was confirmed and documented with a fluoroscopic spot radiograph. The port was accessed with a Huber needle, aspirated and flushed with heparinized saline.  The venotomy site was closed with an interrupted 4-0 Vicryl suture. The port pocket incision was closed with interrupted 2-0 Vicryl suture and the skin was opposed with a running subcuticular 4-0 Vicryl suture. Dermabond and Steri-strips were applied to both incisions. Dressings were placed. The patient tolerated the procedure well without immediate post procedural complication.  COMPLICATIONS: None immediate  FINDINGS: After catheter placement, the tip lies within the superior cavoatrial junction. The catheter aspirates and flushes normally and is ready for immediate use.  IMPRESSION: Successful placement of a right internal jugular approach power injectable Port-A-Cath. The catheter is ready for immediate use.   Electronically Signed   By: Sandi Mariscal M.D.   On: 08/21/2013 16:05   Ir US Guide Vasc Access Right  08/21/2013   INDICATION: History of multiple myeloma, in need of intravenous access for the initiation of chemotherapy.  EXAM: IMPLANTED PORT A CATH PLACEMENT WITH ULTRASOUND AND FLUOROSCOPIC GUIDANCE  MEDICATIONS: Vancomycin 1 g IV; IV antibiotic was given in an appropriate time interval prior to skin puncture.  ANESTHESIA/SEDATION: Versed 3 mg IV; Fentanyl 100 mcg IV;  Total Moderate Sedation Time  30  minutes.  CONTRAST:  None  COMPARISON:   None.  FLUOROSCOPY TIME:  48 seconds.  PROCEDURE: The procedure, risks, benefits, and alternatives were explained to the patient. Questions regarding the procedure were encouraged and answered. The patient understands and consents to the procedure.  The right neck and  chest were prepped with chlorhexidine in a sterile fashion, and a sterile drape was applied covering the operative field. Maximum barrier sterile technique with sterile gowns and gloves were used for the procedure. A timeout was performed prior to the initiation of the procedure. Local anesthesia was provided with 1% lidocaine with epinephrine.  After creating a small venotomy incision, a micropuncture kit was utilized to access the internal jugular vein under direct, real-time ultrasound guidance. Ultrasound image documentation was performed. The microwire was kinked to measure appropriate catheter length.  A subcutaneous port pocket was then created along the upper chest wall utilizing a combination of sharp and blunt dissection. The pocket was irrigated with sterile saline. A single lumen ISP power injectable port was chosen for placement. The 8 Fr catheter was tunneled from the port pocket site to the venotomy incision. The port was placed in the pocket. The external catheter was trimmed to appropriate length. At the venotomy, an 8 Fr peel-away sheath was placed over a guidewire under fluoroscopic guidance. The catheter was then placed through the sheath and the sheath was removed. Final catheter positioning was confirmed and documented with a fluoroscopic spot radiograph. The port was accessed with a Huber needle, aspirated and flushed with heparinized saline.  The venotomy site was closed with an interrupted 4-0 Vicryl suture. The port pocket incision was closed with interrupted 2-0 Vicryl suture and the skin was opposed with a running subcuticular 4-0 Vicryl suture. Dermabond and Steri-strips were applied to both incisions. Dressings were  placed. The patient tolerated the procedure well without immediate post procedural complication.  COMPLICATIONS: None immediate  FINDINGS: After catheter placement, the tip lies within the superior cavoatrial junction. The catheter aspirates and flushes normally and is ready for immediate use.  IMPRESSION: Successful placement of a right internal jugular approach power injectable Port-A-Cath. The catheter is ready for immediate use.   Electronically Signed   By: Sandi Mariscal M.D.   On: 08/21/2013 16:05   Dg Chest Port 1 View  09/10/2013   CLINICAL DATA:  Cough.  EXAM: PORTABLE CHEST - 1 VIEW  COMPARISON:  08/19/2013  FINDINGS: Right Port-A-Cath is in place, unchanged. Low lung volumes. Mild peribronchial thickening and interstitial prominence, similar to prior study. No confluent airspace opacities or effusions. Heart is normal size.  IMPRESSION: Low lung volumes with persistent peribronchial thickening and interstitial prominence, likely bronchitis. No real change.   Electronically Signed   By: Rolm Baptise M.D.   On: 09/10/2013 08:25   Portable Chest 1 View  08/23/2013   CLINICAL DATA:  Cough, weakness, recent pneumonia, history pulmonary embolism, smoking, multiple myeloma  EXAM: PORTABLE CHEST - 1 VIEW  COMPARISON:  Portable exam 1958 hr compared to 09/02/2013  FINDINGS: Right jugular Port-A-Cath tip projects over cavoatrial junction.  Upper normal heart size, accentuated by hypoinflation.  Low lung volumes with right basilar atelectasis and crowding of perihilar markings.  Minimal peribronchial thickening.  No definite infiltrate, pleural effusion or pneumothorax.  Prior spinal augmentation procedures at 4 contiguous levels of the thoracolumbar spine.  IMPRESSION: Bronchitic changes with basilar hypoinflation and right basilar atelectasis.   Electronically Signed   By: Lavonia Dana M.D.   On: 08/29/2013 20:40   Dg Chest Port 1 View  09/02/2013   CLINICAL DATA:  Acute respiratory failure. Pneumonia.  Pulmonary edema. Multiple myeloma.  EXAM: PORTABLE CHEST - 1 VIEW  COMPARISON:  08/31/2013  FINDINGS: Decrease and bilateral airspace disease is seen compared to previous study. Low lung volumes  are again noted. No evidence of pleural effusion. Heart size is normal. Right-sided power port remains in appropriate position.  IMPRESSION: Continued improvement in bilateral airspace disease.   Electronically Signed   By: Earle Gell M.D.   On: 09/02/2013 07:28   Dg Chest Port 1 View  08/31/2013   CLINICAL DATA:  Pulmonary infiltrates. Cough and shortness of breath.  EXAM: PORTABLE CHEST - 1 VIEW  COMPARISON:  08/29/2013, 08/28/2013, and 08/25/2013 and chest CT dated 08/26/2013  FINDINGS: Power port is in place unchanged. NG tube and endotracheal tube a been removed. There is significant partial clearing of the bilateral pulmonary infiltrates. Heart size and vascularity are normal. No visible effusions.  IMPRESSION: Partial clearing of the extensive bilateral pulmonary infiltrates.   Electronically Signed   By: Rozetta Nunnery M.D.   On: 08/31/2013 13:32   Dg Chest Port 1 View  08/29/2013   CLINICAL DATA:  Respiratory failure.  EXAM: PORTABLE CHEST - 1 VIEW  COMPARISON:  Single view of the chest 08/28/2013 and 08/27/2013.  FINDINGS: Support tubes and lines are unchanged. Extensive bilateral airspace disease persists without marked change. There is no pneumothorax or pleural effusion. Heart size is normal. Multilevel vertebral augmentation noted.  IMPRESSION: No change in extensive bilateral airspace disease.   Electronically Signed   By: Inge Rise M.D.   On: 08/29/2013 07:31   Dg Chest Port 1 View  08/28/2013   CLINICAL DATA:  Intubation.  EXAM: PORTABLE CHEST - 1 VIEW  COMPARISON:  08/27/2013.  FINDINGS: Endotracheal tube tip noted 2.3 cm above the carina. Central line in stable position. NG tube tip below the left hemidiaphragm. Bilateral dense pulmonary alveolar infiltrates are present and unchanged. No  pleural effusion or pneumothorax. Heart size is normal. No acute osseous abnormality.  IMPRESSION: 1.  Stable line and tube positions.  2. Bilateral dense unchanged pulmonary alveolar infiltrates. This can't be seen with pulmonary edema, ARDS, and /or bilateral pneumonia .   Electronically Signed   By: Marcello Moores  Register   On: 08/28/2013 07:29   Portable Chest Xray  08/27/2013   CLINICAL DATA:  Acute respiratory failure. On ventilator. Healthcare associated pneumonia. Multiple myeloma.  EXAM: PORTABLE CHEST - 1 VIEW  COMPARISON:  08/25/2013  FINDINGS: Right-sided power port remains in place. New endotracheal tube is seen with tip approximately 2.5 cm above the carina.  Significant interval worsening of diffuse bilateral airspace disease is seen since previous study. Low lung volumes again noted. No evidence of pneumothorax or pleural effusion. Heart size is normal.  IMPRESSION: Endotracheal tube in appropriate position. Significant worsening of diffuse bilateral airspace disease.   Electronically Signed   By: Earle Gell M.D.   On: 08/27/2013 11:58       ASSESSMENT AND PLAN:  4.  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 Positive for mild cord compression at the T10 level.No definite associated spinal cord signal abnormality. Epidural tumor here is contiguous with Mild lumbar dural thickening and increased enhancement which tracks caudally to the L1 level. Superimposed lateral paraspinal tumor extension most pronounced at T10 and greater on the left " He received Decadron IV, radiation initiated on 2/4  for reduction of spinal  Cord edema .  Continue PT today    WERTMAN,SARA E, PA-C 09/13/2013, 11:00 AM  ADDENDUM:  I agree with the above. This is a very difficult problem.  He has very aggressive myeloma. He is getting radiation therapy right now.  We need to get him a different brace. We need to get a thoracic and lumbar brace. Once he gets this, that he can probably ambulate better and do physical therapy. He cannot bend over.  I understand that we need to think about getting into rehabilitation. My concern is that with this cord compression, he is at significant risk for the development of neurological issues. These cannot probably be assessed, in my mind, in a rehabilitation facility. I think that if he has any neurological problems, these will happen within the next week. At that point, then radiation should be able to start to show some effect.  He is quite weak. His appetite is slowly coming back. He is having no nausea.  His labs are pretty stable. His platelet count is low at 37. We have to watch this. His hemoglobin is 7.6. We probably will have to transfuse him.  His prealbumin is dropping. This also can be a concern.  We still just need to stabilize him better. He is at risk for neurologic compromise. We just need to be diligent and watch for this.  I do appreciate everybody's help with Mr. Valeriano. He wants a be aggressive right now. As such, I do not see a role for palliative care.   Pete E.

## 2013-09-13 NOTE — Progress Notes (Signed)
[facility] Therapy Treatment Record Phone [phone]   [id number] Therapy was administered to Robert Berger on: 09/13/2013  3:58 PM and was treatment # 2 out of a planned course of 10 treatments.

## 2013-09-13 NOTE — Care Management Note (Signed)
Patient discussed in LOS. MD Advisor Dr. Burnard Bunting informed of pt's anticipated LOS per Oncology's note 7-10 days. Cm noted no orders or plans for chemo in recent progress note. Per MD Advisor,will  Further discuss plan of care with attending. Will continue to follow.    Venita Lick Flara Storti,MSN,RN [phone]

## 2013-09-13 NOTE — Significant Event (Signed)
Rapid Response Event Note  Overview: Time Called: 2015 Arrival Time: 2020 Event Type: Unknown  Initial Focused Assessment:Pt. Had 5 minute episode of anterior chest discomfort "like a  Band" with SOB . Episode resolved without treatment. Dr. Benay Spice called. Pt very inquisitive of previous hospitalization and discussing his disease and treatments.   Interventions: EKG done and pt. Assessed.    Event Summary:v/s stable. Dr.Sherrill called updated and requested cardiac enzymes Name of Physician Notified: Dr. Benay Spice at 2035    at    Outcome: Stayed in room and stabalized     Pricilla Riffle

## 2013-09-14 ENCOUNTER — Inpatient Hospital Stay (HOSPITAL_COMMUNITY): Payer: Medicare Other

## 2013-09-14 ENCOUNTER — Encounter: Payer: Self-pay | Admitting: *Deleted

## 2013-09-14 ENCOUNTER — Ambulatory Visit: Payer: BC Managed Care – PPO

## 2013-09-14 ENCOUNTER — Ambulatory Visit
Admit: 2013-09-14 | Discharge: 2013-09-14 | Disposition: A | Discharge status: Home / Self Care | Payer: BC Managed Care – PPO | Attending: Radiation Oncology | Admitting: Radiation Oncology

## 2013-09-14 LAB — CBC
HEMATOCRIT: 22.2 % — AB (ref 39.0–52.0)
HEMOGLOBIN: 7.4 g/dL — AB (ref 13.0–17.0)
MCH: 30 pg (ref 26.0–34.0)
MCHC: 33.3 g/dL (ref 30.0–36.0)
MCV: 89.9 fL (ref 78.0–100.0)
Platelets: 37 10*3/uL — ABNORMAL LOW (ref 150–400)
RBC: 2.47 MIL/uL — AB (ref 4.22–5.81)
RDW: 19.9 % — ABNORMAL HIGH (ref 11.5–15.5)
WBC: 24.4 10*3/uL — AB (ref 4.0–10.5)

## 2013-09-14 LAB — COMPREHENSIVE METABOLIC PANEL
ALT: 57 U/L — ABNORMAL HIGH (ref 0–53)
AST: 43 U/L — ABNORMAL HIGH (ref 0–37)
Albumin: 2.1 g/dL — ABNORMAL LOW (ref 3.5–5.2)
Alkaline Phosphatase: 303 U/L — ABNORMAL HIGH (ref 39–117)
BILIRUBIN TOTAL: 1 mg/dL (ref 0.3–1.2)
BUN: 15 mg/dL (ref 6–23)
CHLORIDE: 105 meq/L (ref 96–112)
CO2: 25 mEq/L (ref 19–32)
Calcium: 7.9 mg/dL — ABNORMAL LOW (ref 8.4–10.5)
Creatinine, Ser: 0.77 mg/dL (ref 0.50–1.35)
GFR calc non Af Amer: 89 mL/min — ABNORMAL LOW (ref >90)
GLUCOSE: 149 mg/dL — AB (ref 70–99)
POTASSIUM: 3.8 meq/L (ref 3.7–5.3)
SODIUM: 135 meq/L — AB (ref 137–147)
TOTAL PROTEIN: 9.2 g/dL — AB (ref 6.0–8.3)

## 2013-09-14 LAB — GLUCOSE, CAPILLARY: GLUCOSE-CAPILLARY: 126 mg/dL — AB (ref 70–99)

## 2013-09-14 MED ORDER — ENSURE COMPLETE PO LIQD
237.0000 mL | ORAL | Status: DC
  Administered 2013-09-15 – 2013-09-18 (×4): 237 mL via ORAL

## 2013-09-14 MED ORDER — NITROGLYCERIN 0.4 MG SL SUBL
SUBLINGUAL_TABLET | SUBLINGUAL | Status: AC
  Filled 2013-09-14: qty 25

## 2013-09-14 MED ORDER — FUROSEMIDE 10 MG/ML IJ SOLN
40.0000 mg | Freq: Once | INTRAMUSCULAR | Status: AC
  Administered 2013-09-14: 40 mg via INTRAVENOUS
  Filled 2013-09-14: qty 4

## 2013-09-14 MED ORDER — METOPROLOL TARTRATE 50 MG PO TABS
50.0000 mg | ORAL_TABLET | Freq: Two times a day (BID) | ORAL | Status: DC
  Administered 2013-09-14 – 2013-09-19 (×9): 50 mg via ORAL
  Filled 2013-09-14 (×12): qty 1

## 2013-09-14 MED ORDER — FUROSEMIDE 40 MG PO TABS
40.0000 mg | ORAL_TABLET | Freq: Every day | ORAL | Status: DC
  Administered 2013-09-15 – 2013-09-19 (×3): 40 mg via ORAL
  Filled 2013-09-14 (×5): qty 1

## 2013-09-14 MED ORDER — SUCRALFATE 1 GM/10ML PO SUSP
1.0000 g | Freq: Three times a day (TID) | ORAL | Status: DC
  Administered 2013-09-14 – 2013-09-19 (×19): 1 g via ORAL
  Filled 2013-09-14 (×25): qty 10

## 2013-09-14 MED ORDER — METOPROLOL TARTRATE 25 MG PO TABS
25.0000 mg | ORAL_TABLET | Freq: Once | ORAL | Status: AC
  Administered 2013-09-14: 25 mg via ORAL
  Filled 2013-09-14 (×2): qty 1

## 2013-09-14 MED ORDER — DEXAMETHASONE SODIUM PHOSPHATE 4 MG/ML IJ SOLN
4.0000 mg | Freq: Four times a day (QID) | INTRAMUSCULAR | Status: DC
  Administered 2013-09-14 – 2013-09-21 (×27): 4 mg via INTRAVENOUS
  Filled 2013-09-14 (×8): qty 1
  Filled 2013-09-14 (×2): qty 0.4
  Filled 2013-09-14 (×4): qty 1
  Filled 2013-09-14: qty 0.4
  Filled 2013-09-14 (×13): qty 1
  Filled 2013-09-14: qty 0.4
  Filled 2013-09-14 (×3): qty 1

## 2013-09-14 MED ORDER — POLYETHYLENE GLYCOL 3350 17 G PO PACK
17.0000 g | PACK | Freq: Every day | ORAL | Status: DC
  Administered 2013-09-14 – 2013-09-19 (×5): 17 g via ORAL
  Filled 2013-09-14 (×6): qty 1

## 2013-09-14 MED ORDER — BOOST / RESOURCE BREEZE PO LIQD
1.0000 | Freq: Two times a day (BID) | ORAL | Status: DC
  Administered 2013-09-14 – 2013-09-17 (×7): 1 via ORAL

## 2013-09-14 NOTE — Significant Event (Signed)
Rapid Response Event Note  Overview: Time Called: 2015 Arrival Time: 2020 Event Type: Unknown  Initial Focused Assessment: chest discomfort    Interventions: 12 ld EKG, VS, O2 at 4L. Dilaudid 2mg  given IV. Pt states he has had googd results with it in the past. Am lopressor given. PCXR obtained.   Event Summary: bedside RN discusses with Dr Marin Olp 12 Ld results and VS,. Dr Benay Spice made aware as well.          Outcome: Stayed in room and stabalized. See orders     Landry Mellow, Hilario Quarry

## 2013-09-14 NOTE — Progress Notes (Signed)
PT Cancellation Note  Patient Details Name: Robert Berger MRN: [id number] DOB: 1940/12/29   Cancelled Treatment:    Reason Eval/Treat Not Completed: noted MD recommending new TLSO brace-brace is not in room at this present time. Also pt states he prefers to wait until after his radiation treatment at 3:00 pm. Explained to pt that we will likely have to check back on tomorrow to attempt OOB.    Weston Anna, MPT Pager: [phone]

## 2013-09-14 NOTE — Progress Notes (Signed)
Dr.Ennever notified re: results of PCXR.- Sandie Ano RN

## 2013-09-14 NOTE — Progress Notes (Signed)
Informed Dr. Marin Olp of CXR result that was received by inpatient nurse Butch Penny on 3E "Pericardial thickening and vascular crowding"  Dr. Marin Olp ordered Lasix 40 mg IV once then put patient back on Lasix 40 mg po everyday.  Order called to Butch Penny.

## 2013-09-14 NOTE — Progress Notes (Signed)
INITIAL NUTRITION ASSESSMENT  DOCUMENTATION CODES Per approved criteria  -Not Applicable   INTERVENTION: -Recommend Resource Breeze po BID, each supplement provides 250 kcal and 9 grams of protein. Monitor CBG's and modify supplement frequency as needed -Recommend Ensure Complete once daily -Continue with liberalized diet to encourage PO intake -Encouraged PO intake -Will continue to monitor  NUTRITION DIAGNOSIS: Increased nutrient needs (protein/kcal) related to increased demand for nutrients as evidenced by chronic disease-multiple myeloma, undergoing radiation  Goal: Pt to meet >/= 90% of their estimated nutrition needs   Monitor:  Total protein/energy intake, labs, weights  Reason for Assessment: Consult to Assess  73 y.o. male  Admitting Dx: <principal problem not specified>  ASSESSMENT: Robert Berger is a 73 year old white gentleman. 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 He was doing very well with respect to maintenance therapy for myeloma. In November, however, he had rapid recurrence. He had a large lumbosacral mass. He had disease all throughout the spine. Bone marrow was done which showed relapsed myeloma. He had a very poor cytogenetics with the relapse.  -Pt reported decreased appetite and weight fluctuations related to chemo/radiation treatments and frequent hospitalizations -Prior to myeloma dx, pt's usual weight was 270 lbs. Decreased to 200 lbs over a period of several months.  -Pt's lowest weight was 182 lbs, but was able to regain weight back and has maintained 200 lbs until recent hospitalization in 07/2013. -Had been d/c to SNF for temporary rehab at end of 07/2013, PO intake significantly declined d/t limited food options and its decreased food palatability.  -Was started on Marinol on 2/02, but d/c on 2/06 d/t medication possibly causing "floaters" per MD note -Pt reported an improvement in appetite past several days, is able to eat 75% or more of meals.  Noted the food is slightly bland, but it is able to use seasonings to increase its appeal. Denied any nausea -Enjoys [employer] supplement over [employer]. Will modify to BID between meals as pt did not have preference for when to receive supplement, preferred to have available as needed on unit.  -RN also noted pt asking for Boost/Ensure occasionally. Will add once daily to assist in meeting protein needs -Will monitor intake of Resource Breeze and increase Ensure as needed -Has hx of non insulin dependent DM. Will continue with liberalized diet to encourage PO intake. Will modify as needed -Nutrition Focused Physical Exam:  Subcutaneous Fat:  Orbital Region: WNL Upper Arm Region: WNL Thoracic and Lumbar Region: WNL  Muscle:  Temple Region: WNL Clavicle Bone Region: WNL Clavicle and Acromion Bone Region: WNL Scapular Bone Region: WNL Dorsal Hand: WNL Patellar Region: WNL Anterior Thigh Region: moderate Posterior Calf Region: moderate  Edema: N/A    Height: Ht Readings from Last 1 Encounters:  08/28/2013 $RemoveB'5\' 10"'mnAWplIH$  (1.778 m)    Weight: Wt Readings from Last 1 Encounters:  09/09/13 193 lb 8 oz (87.771 kg)    Ideal Body Weight:166 lbs  % Ideal Body Weight: 116%  Wt Readings from Last 10 Encounters:  09/09/13 193 lb 8 oz (87.771 kg)  09/03/13 186 lb 3.2 oz (84.46 kg)  08/13/13 195 lb (88.451 kg)  08/07/13 194 lb 1.6 oz (88.043 kg)  07/30/13 200 lb (90.719 kg)  07/25/13 203 lb (92.08 kg)  07/24/13 202 lb 1.6 oz (91.672 kg)  07/12/13 200 lb 1.6 oz (90.765 kg)  07/06/13 203 lb (92.08 kg)  06/21/13 199 lb (90.266 kg)    Usual Body Weight: 200 lbs  % Usual Body  Weight: 97%  BMI:  Body mass index is 27.76 kg/(m^2). Overweight  Estimated Nutritional Needs: Kcal: 1850-2050 Protein: 105-115 gram Fluid: >/= 2000 ml/daily  Skin: WDL  Diet Order: General  EDUCATION NEEDS: -No education needs identified at this time   Intake/Output Summary (Last 24 hours) at  09/14/13 1035 Last data filed at 09/14/13 0602  Gross per 24 hour  Intake    600 ml  Output   1250 ml  Net   -650 ml    Last BM: 2/05  Labs:   Recent Labs Lab 08/28/2013 1735  09/11/13 0455 09/13/13 0510 09/14/13 0505  NA 136*  < > 137 136* 135*  K 4.3  < > 3.8 3.8 3.8  CL 100  < > 106 105 105  CO2 28  < > $R'26 26 25  'pP$ BUN 15  < > $R'8 11 15  'oO$ CREATININE 0.74  < > 0.87 0.77 0.77  CALCIUM 8.8  < > 7.5* 7.8* 7.9*  MG 2.1  --   --   --   --   GLUCOSE 156*  < > 117* 173* 149*  < > = values in this interval not displayed.  CBG (last 3)   Recent Labs  09/12/13 0804 09/13/13 0805 09/14/13 0605  GLUCAP 145* 150* 126*    Scheduled Meds: . acyclovir  400 mg Oral Daily  . dexamethasone  4 mg Intravenous Q6H  . feeding supplement (RESOURCE BREEZE)  1 Container Oral BID BM  . fentaNYL  100 mcg Transdermal Q72H  . ipratropium-albuterol  3 mL Nebulization BID  . metoprolol tartrate  50 mg Oral BID  . nitroGLYCERIN      . pantoprazole  40 mg Oral Daily  . sucralfate  1 g Oral TID WC & HS    Continuous Infusions: . sodium chloride 50 mL/hr (09/14/13 9381)    Past Medical History  Diagnosis Date  . Arthritis   . GERD (gastroesophageal reflux disease)   . Complication of anesthesia     aspiration during surgery, difficult to wake up  . Multiple myeloma   . Status post radiation therapy 03/23/12 - 04/05/12    T8 - L4/ 20 Gy/ 10 Fractions  . Dysphagia 05/06/12    ED Visit  . DVT (deep venous thrombosis)   . PE (pulmonary embolism)   . Cellulitis   . Abscess     Left arm and groin area  . History of radiation therapy 07/24/13-08/08/13    25 gray to lower lumbar spine and upper sacrum region  . Spinal cord compression     T10    Past Surgical History  Procedure Laterality Date  . Hemorrhoid surgery    . Laparotomy  05/14/2012    Procedure: EXPLORATORY LAPAROTOMY;  Surgeon: Earnstine Regal, MD;  Location: [facility];  Service: General;  Laterality: N/A;  . Colostomy  05/14/2012     Procedure: COLOSTOMY;  Surgeon: Earnstine Regal, MD;  Location: [facility];  Service: General;  Laterality: N/A;  . Back surgery      47 years ago  . Fracture surgery      ankle    Atlee Abide MS RD LDN Clinical Dietitian [phone]

## 2013-09-14 NOTE — Progress Notes (Signed)
Patient c/o "Band" of chest pain at 0755 pain level of 8-9, it happened when he was eating breakfast and also c/o SOB. Vital signs checked BP 173/97,PR121,RR 20,temp 98.3.Rapid response nurse notified.12 Lead EKG obtained, I gave  Metoprolol 25 mg. Dr Marin Olp was notified. Rapid response nurse gave 2mg  Dialudid IV and stat PCxR was obtained. His BP is trending down and also his chest pain down to 5.He is on 2L of oxygen, oxygen sat of 97%. Will continue to monitor the patient.- Sandie Ano RN

## 2013-09-14 NOTE — Progress Notes (Signed)
Patient stated he felt better, chest pain level down to 5 and he said hi SOB if okay. In bed resting.- Sandie Ano RN

## 2013-09-14 NOTE — Progress Notes (Signed)
[facility] Therapy Treatment Record Phone [phone]   [id number] Therapy was administered to Robert Berger on: 09/14/2013  3:18 PM and was treatment # 3 out of a planned course of 10 treatments.

## 2013-09-14 NOTE — Progress Notes (Signed)
Mr. Rorke has a little SOB last night.  EKG was (-).  Good O2 sat. Will check CXR.   Getting XRT.  Doing ok with this.  No neurological issues. No bleeding.  Appetite is doing ok.  C/o "floaters" with his vision.  ?? If this is due to meds.  I will d/c Marinol.  I do not think this would be due to hyperviscosity.  I did order a new back brace.  The only issue with this might be his colostomy, and whether the brace will fit around this.  His VSS are stable.  BP is 146/73.  Lungs are clear.  Good breath sounds bilaterally. Cor: RRR.  (-) m/r/b ABD:  Soft, Good BS.  (-) tender.  (-) HSM. EXT:  (-) c/c/e. Good strength bilaterally. CNS: (-) focal deficits.  His labs look ok.  Hgb is slowly dropping -- will hold on transfusing him.  I may think about starting chemo next week.  I know that this will overlap XRT, but I do not think there will be any issues, but I will look into this.  We need to try to improve his level of activity. I think that a new back brace might help this.  He has bad myeloma and I think that the only hope for a decent response is aggressive chemo. I expect to use VD-PACE.  Pete E.  Ephesians 6:9-10

## 2013-09-14 NOTE — Progress Notes (Signed)
New orders received from Dr. Dicie Beam' nurse for Lasix 40 mg IV once and to placed patient on Lasix 40 mg po daily.- Sandie Ano RN

## 2013-09-15 DIAGNOSIS — Z86711 Personal history of pulmonary embolism: Secondary | ICD-10-CM

## 2013-09-15 DIAGNOSIS — D61818 Other pancytopenia: Secondary | ICD-10-CM

## 2013-09-15 DIAGNOSIS — D72829 Elevated white blood cell count, unspecified: Secondary | ICD-10-CM

## 2013-09-15 LAB — CBC
HCT: 21.9 % — ABNORMAL LOW (ref 39.0–52.0)
HEMOGLOBIN: 7.1 g/dL — AB (ref 13.0–17.0)
MCH: 29.7 pg (ref 26.0–34.0)
MCHC: 32.4 g/dL (ref 30.0–36.0)
MCV: 91.6 fL (ref 78.0–100.0)
PLATELETS: 24 10*3/uL — AB (ref 150–400)
RBC: 2.39 MIL/uL — AB (ref 4.22–5.81)
RDW: 20.3 % — ABNORMAL HIGH (ref 11.5–15.5)
WBC: 25.8 10*3/uL — ABNORMAL HIGH (ref 4.0–10.5)

## 2013-09-15 LAB — COMPREHENSIVE METABOLIC PANEL
ALT: 46 U/L (ref 0–53)
AST: 34 U/L (ref 0–37)
Albumin: 2.1 g/dL — ABNORMAL LOW (ref 3.5–5.2)
Alkaline Phosphatase: 265 U/L — ABNORMAL HIGH (ref 39–117)
BUN: 18 mg/dL (ref 6–23)
CO2: 29 meq/L (ref 19–32)
CREATININE: 0.79 mg/dL (ref 0.50–1.35)
Calcium: 7.8 mg/dL — ABNORMAL LOW (ref 8.4–10.5)
Chloride: 103 mEq/L (ref 96–112)
GFR, EST NON AFRICAN AMERICAN: 88 mL/min — AB (ref >90)
GLUCOSE: 146 mg/dL — AB (ref 70–99)
Potassium: 4.1 mEq/L (ref 3.7–5.3)
SODIUM: 135 meq/L — AB (ref 137–147)
TOTAL PROTEIN: 9 g/dL — AB (ref 6.0–8.3)
Total Bilirubin: 1 mg/dL (ref 0.3–1.2)

## 2013-09-15 LAB — GLUCOSE, CAPILLARY: Glucose-Capillary: 149 mg/dL — ABNORMAL HIGH (ref 70–99)

## 2013-09-15 LAB — PREPARE RBC (CROSSMATCH)

## 2013-09-15 MED ORDER — SODIUM CHLORIDE 0.9 % IV SOLN: 250.0000 mL | Freq: Once | INTRAVENOUS | Status: AC

## 2013-09-15 MED ORDER — BIOTENE DRY MOUTH MT LIQD
15.0000 mL | Freq: Two times a day (BID) | OROMUCOSAL | Status: DC
  Administered 2013-09-15 – 2013-09-17 (×3): 15 mL via OROMUCOSAL

## 2013-09-15 MED ORDER — SODIUM CHLORIDE 0.9 % IJ SOLN: 3.0000 mL | INTRAMUSCULAR | Status: DC | PRN

## 2013-09-15 MED ORDER — DOCUSATE SODIUM 100 MG PO CAPS
200.0000 mg | ORAL_CAPSULE | Freq: Two times a day (BID) | ORAL | Status: AC
  Administered 2013-09-15 – 2013-09-17 (×3): 200 mg via ORAL
  Filled 2013-09-15 (×4): qty 2

## 2013-09-15 MED ORDER — TEMAZEPAM 7.5 MG PO CAPS: 7.5000 mg | ORAL_CAPSULE | Freq: Every evening | ORAL | Status: DC | PRN

## 2013-09-15 MED ORDER — HYDROMORPHONE HCL 2 MG PO TABS
2.0000 mg | ORAL_TABLET | ORAL | Status: DC | PRN
  Administered 2013-09-15: 4 mg via ORAL
  Administered 2013-09-16: 2 mg via ORAL
  Administered 2013-09-17 – 2013-09-19 (×2): 4 mg via ORAL
  Filled 2013-09-15: qty 2
  Filled 2013-09-15: qty 1
  Filled 2013-09-15 (×3): qty 2

## 2013-09-15 MED ORDER — SODIUM CHLORIDE 0.9 % IJ SOLN: 10.0000 mL | INTRAMUSCULAR | Status: DC | PRN

## 2013-09-15 MED ORDER — ACETAMINOPHEN 325 MG PO TABS
650.0000 mg | ORAL_TABLET | Freq: Once | ORAL | Status: AC
  Administered 2013-09-15: 650 mg via ORAL
  Filled 2013-09-15: qty 2

## 2013-09-15 MED ORDER — HEPARIN SOD (PORK) LOCK FLUSH 100 UNIT/ML IV SOLN
500.0000 [IU] | Freq: Every day | INTRAVENOUS | Status: DC | PRN
  Filled 2013-09-15: qty 5

## 2013-09-15 MED ORDER — DIPHENHYDRAMINE HCL 25 MG PO CAPS
25.0000 mg | ORAL_CAPSULE | Freq: Once | ORAL | Status: AC
  Administered 2013-09-15: 25 mg via ORAL
  Filled 2013-09-15: qty 1

## 2013-09-15 MED ORDER — HEPARIN SOD (PORK) LOCK FLUSH 100 UNIT/ML IV SOLN
250.0000 [IU] | INTRAVENOUS | Status: DC | PRN
  Filled 2013-09-15: qty 3

## 2013-09-15 NOTE — Progress Notes (Signed)
Robert Berger   DOB:October 08, 1940   GY#:[id number]   RFF#:[id number]  Subjective: patient is impatient "to get things going." Talked about his children (from earlier marriage) who live far away and (some) are estranged; and his wife's struggles with bipolar disorder; no ha/, N/V; vision "better" when he takes po instead of IV dilaudid; managing BMs (does not want to take MiraLAX daily); insomnia related to steroids but dislikes ambien and benadryl not helpful; denies cough, phlegm, but SOB, O2 in place; no dysuria; bruises but no over bleeding; nof amily in room   Objective: middle aged White man examined in bed Filed Vitals:   09/15/13 0538  BP: 131/75  Pulse: 80  Temp: 97.4 F (36.3 C)  Resp: 18    Body mass index is 27.76 kg/(m^2).  Intake/Output Summary (Last 24 hours) at 09/15/13 0946 Last data filed at 09/14/13 2235  Gross per 24 hour  Intake    120 ml  Output   1950 ml  Net  -1830 ml     Sclerae unicteric, pupils round and equal  Oropharynx clear  No peripheral adenopathy  Lungs clear upper lobes--auscultated anterolaterally  Heart regular rate and rhythm  Abdomen benign, colostomy LLQ  Neuro nonfocal, well oriented    CBG (last 3)   Recent Labs  09/13/13 0805 09/14/13 0605 09/15/13 0723  GLUCAP 150* 126* 149*     Labs:  Lab Results  Component Value Date   WBC 25.8* 09/15/2013   HGB 7.1* 09/15/2013   HCT 21.9* 09/15/2013   MCV 91.6 09/15/2013   PLT 24* 09/15/2013   NEUTROABS 0.9* 08/31/2013    _0 @  Urine Studies No results found for this basename: UACOL, UAPR, USPG, UPH, UTP, UGL, UKET, UBIL, UHGB, UNIT, UROB, ULEU, UEPI, UWBC, URBC, UBAC, CAST, CRYS, UCOM, BILUA,  in the last 72 hours  Basic Metabolic Panel:  Recent Labs Lab 09/09/13 0455 09/11/13 0455 09/13/13 0510 09/14/13 0505 09/15/13 0510  NA 141 137 136* 135* 135*  K 3.7 3.8 3.8 3.8 4.1  CL 107 106 105 105 103  CO2 _1 GLUCOSE 94 117* 173* 149* 146*  BUN _2 CREATININE 0.84 0.87 0.77 0.77 0.79  CALCIUM 7.7* 7.5* 7.8* 7.9* 7.8*   GFR Estimated Creatinine Clearance: 93.1 ml/min (by C-G formula based on Cr of 0.79). Liver Function Tests:  Recent Labs Lab 09/09/13 0455 09/11/13 0455 09/13/13 0510 09/14/13 0505 09/15/13 0510  AST 40* 29 91* 43* 34  ALT 37 25 68* 57* 46  ALKPHOS 155* 136* 347* 303* 265*  BILITOT 0.5 0.7 2.2* 1.0 1.0  PROT 8.7* 8.8* 8.9* 9.2* 9.0*  ALBUMIN 2.2* 2.1* 2.1* 2.1* 2.1*   No results found for this basename: LIPASE, AMYLASE,  in the last 168 hours No results found for this basename: AMMONIA,  in the last 168 hours Coagulation profile No results found for this basename: INR, PROTIME,  in the last 168 hours  CBC:  Recent Labs Lab 09/09/13 0455 09/11/13 0455 09/13/13 0510 09/14/13 0505 09/15/13 0510  WBC 11.0* 12.4* 17.8* 24.4* 25.8*  HGB 8.4* 7.8* 7.6* 7.4* 7.1*  HCT 25.9* 24.0* 23.5* 22.2* 21.9*  MCV 91.2 91.6 91.4 89.9 91.6  PLT 45* 42* 37* 37* 24*   Cardiac Enzymes: No results found for this basename: CKTOTAL, CKMB, CKMBINDEX, TROPONINI,  in the last 168 hours BNP: No components found with this basename: POCBNP,  CBG:  Recent Labs Lab 09/11/13 1150 09/12/13 0804 09/13/13  0805 10/14/2013 0605 09/15/13 0723  GLUCAP 108* 145* 150* 126* 149*   D-Dimer No results found for this basename: DDIMER,  in the last 72 hours Hgb A1c No results found for this basename: HGBA1C,  in the last 72 hours Lipid Profile No results found for this basename: CHOL, HDL, LDLCALC, TRIG, CHOLHDL, LDLDIRECT,  in the last 72 hours Thyroid function studies No results found for this basename: TSH, T4TOTAL, FREET3, T3FREE, THYROIDAB,  in the last 72 hours Anemia work up No results found for this basename: VITAMINB12, FOLATE, FERRITIN, TIBC, IRON, RETICCTPCT,  in the last 72 hours Microbiology No results found for this or any previous visit (from the past 240 hour(s)).    Studies:  Dg Chest Port 1  View  2013-10-14   CLINICAL DATA:  Shortness of breath and chest pain. Question pulmonary edema.  EXAM: PORTABLE CHEST - 1 VIEW  COMPARISON:  09/10/2013  FINDINGS: Right jugular Port-A-Cath remains in place with tip near the cavoatrial junction. The cardiomediastinal silhouette is unchanged and within normal limits. Lung volumes remain diminished with similar appearance of vascular crowding and mild peribronchial thickening. No segmental airspace consolidation is seen. Focal pleural thickening laterally in the right lung base is unchanged. No definite pleural effusion or pneumothorax is identified. Prior vertebral augmentation is noted at multiple levels in the lower thoracic spine.  IMPRESSION: Unchanged appearance of low lung volumes with vascular crowding and peribronchial thickening.   Electronically Signed   By: Logan Bores   On: 10/14/2013 08:39   THORACIC SPINE MRI 09/12/2013 1. Interval worsening of disease with new extraosseous tumor  involving the epidural space at the level of T10 with associated  moderate central canal stenosis. No other areas of cord compression  identified.  2. Moderate bilateral pleural effusions.  3. Right paracentral disc protrusion at T12-L1 without significant  canal or foraminal stenosis.  Critical Value/emergent results were called by telephone at the time  of interpretation on 09/13/2013 at 12:06 AM to Dr. Ruffin Pyo, who  verbally acknowledged these results.   Assessment:/Plan:  73 y.o. [place] man with IgG kappa myeloma in relapse, with unfavorable cytogenetics  1. Severe lower back pain -- receiving XRT, to be completed 09/25/2013; on fentanyl 100 mcg/h, plus PRN dilaudid available both IV and po; bowel prophylaxis in place  2. Relapsed multiple myeloma.   A. Pancytopenia-- Hb continues to drop, will transfuse today; platelets also dropping-- not clear how much marrow reserve patient has to tolerate aggressive chemotherapy  B. Leukocytosis: no evidence  of infection, may be secondary to steroids; will check blood film--leukemic transformation?  3. Recent bilateral community-acquired pneumonia.   4. 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 History of pulmonary embolism-- with platelets <50K, not on prophylactic lovenox; SCDs in place  6. Non-insulin-dependent diabetes.  Results for Robert, Berger (MRN [id number]) as of 09/15/2013 09:45  Ref. Range 09/11/2013 11:50 09/12/2013 08:04 09/13/2013 08:05 10-14-2013 06:05 09/15/2013 07:23  Glucose-Capillary Latest Range: 70-99 mg/dL 108 (H) 145 (H) 150 (H) 126 (H) 149 (H)   7. Advanced directives-- full code per Dr. Arville Lime, MD 09/15/2013  9:46 AM

## 2013-09-15 NOTE — Progress Notes (Signed)
09/15/13 1750  Pt c/o pain at stoma.  MD notified. MD ordered Colace 200mg  BID.

## 2013-09-15 NOTE — Progress Notes (Signed)
Physical Therapy Treatment Patient Details Name: Robert Berger MRN: [id number] DOB: [birth year] Today's Date: 09/15/2013 Time: 7824-2353 PT Time Calculation (min): 23 min  PT Assessment / Plan / Recommendation  History of Present Illness Robert Berger  is a 73 y.o. male, history of multiple myeloma undergoing active chemotherapy and radiation under the care of Dr. Marin Olp, hypertension, pain due to multiple myeloma, admitted with asevere back pain.. CT spine shows extraosseous mass affecting S1.S2. 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 He was then transferred here for further care.Marland Kitchen MRI  reveals T10 enhanced tumor.   PT Comments   TLSO is in room. Pt sat at edge of bed to don/doff brace. IIf MD prefers in supine, please order specifics.  Pt tolerated well and stood and took some steps. Pt gets dyspneic with activity on 3 l. Pt is very motivated. Pt may benefit from post acute rehab as pt reports wife is having a hard time with his current situation.  Follow Up Recommendations  SNF     Does the patient have the potential to tolerate intense rehabilitation     Barriers to Discharge        Equipment Recommendations  None recommended by PT    Recommendations for Other Services OT consult  Frequency Min 3X/week   Progress towards PT Goals Progress towards PT goals: Progressing toward goals  Plan Current plan remains appropriate    Precautions / Restrictions Precautions Precautions: Fall Precaution Comments: on  O2 Required Braces or Orthoses: Spinal Brace Spinal Brace: Thoracolumbosacral orthotic Other Brace/Splint: place on when in supine if able for pain control. Pt sat up to don brace .encouraged to try supine but pt declined.No orders specific for brace.   Pertinent Vitals/Pain Back pain 6-7, sats > 100% dyspnea 3-4 /4   Mobility  Bed Mobility Overal bed mobility: Needs Assistance;+2 for physical assistance;+ 2 for safety/equipment Bed Mobility: Rolling;Sidelying to Sit;Sit to Sidelying Rolling: Min assist Sidelying to sit: +2 for physical assistance;+2 for safety/equipment;HOB elevated;Max assist Sit to supine: HOB elevated;+2 for safety/equipment;Max assist Sit to sidelying: Mod assist;+2 for safety/equipment General bed mobility comments: pt uses HOB to assist with trunk  in sitting and back to sidlying. Support to get trunk into upright. Brace donned/doffed in sitting.multimodal cues for safety and back precautions. Transfers Overall transfer level: Needs assistance Equipment used: Rolling walker (2 wheeled) Transfers: Sit to/from Stand Sit to Stand: +2 physical assistance;+2 safety/equipment;From elevated surface;Mod assist General transfer comment:  donned in sitting.  Ambulation/Gait Ambulation/Gait assistance: +2 physical assistance;+2 safety/equipment;Mod assist Ambulation Distance (Feet): 5 Feet Assistive device: Rolling walker (2 wheeled) General Gait Details: took several steps forward and back, then sideways.    Exercises     PT Diagnosis:    PT Problem List:   PT Treatment Interventions:     PT Goals (current goals can now be found in the care plan section)    Visit Information  Last PT Received On: 09/15/13 Assistance Needed: +2 History of Present Illness: Robert Berger  is a  73 y.o. male, history of multiple myeloma undergoing active chemotherapy and radiation under the care of Dr. Marin Olp, hypertension, pain due to multiple myeloma, admitted with asevere back  pain.. CT spine shows extraosseous mass affecting S1.S2. 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 He was then transferred here for further care.Marland Kitchen MRI  reveals T10 enhanced tumor.    Subjective Data      Cognition  Cognition Arousal/Alertness: Awake/alert Behavior During Therapy: WFL for tasks assessed/performed Overall Cognitive Status: Within Functional Limits for tasks assessed    Balance  Balance Sitting-balance support: Bilateral upper extremity supported;Feet supported Sitting balance-Leahy Scale: Fair Sitting balance - Comments: brace limiting  End of Session PT - End of Session Equipment Utilized During Treatment: Gait belt Activity Tolerance: Patient tolerated treatment well;Patient limited by fatigue Patient left: in bed;with call bell/phone within reach;with family/visitor present Nurse Communication: Mobility status   GP     Claretha Cooper 09/15/2013, 3:24 PM Tresa Endo PT [phone]

## 2013-09-16 DIAGNOSIS — B370 Candidal stomatitis: Secondary | ICD-10-CM

## 2013-09-16 DIAGNOSIS — K5900 Constipation, unspecified: Secondary | ICD-10-CM

## 2013-09-16 LAB — COMPREHENSIVE METABOLIC PANEL
ALBUMIN: 2 g/dL — AB (ref 3.5–5.2)
ALK PHOS: 235 U/L — AB (ref 39–117)
ALT: 52 U/L (ref 0–53)
AST: 46 U/L — ABNORMAL HIGH (ref 0–37)
BUN: 28 mg/dL — ABNORMAL HIGH (ref 6–23)
CALCIUM: 7.6 mg/dL — AB (ref 8.4–10.5)
CO2: 27 mEq/L (ref 19–32)
Chloride: 101 mEq/L (ref 96–112)
Creatinine, Ser: 0.92 mg/dL (ref 0.50–1.35)
GFR calc Af Amer: >90 mL/min (ref >90)
GFR calc non Af Amer: 82 mL/min — ABNORMAL LOW (ref >90)
Glucose, Bld: 213 mg/dL — ABNORMAL HIGH (ref 70–99)
POTASSIUM: 4.1 meq/L (ref 3.7–5.3)
Sodium: 135 mEq/L — ABNORMAL LOW (ref 137–147)
Total Bilirubin: 1.5 mg/dL — ABNORMAL HIGH (ref 0.3–1.2)
Total Protein: 8.2 g/dL (ref 6.0–8.3)

## 2013-09-16 LAB — CBC WITH DIFFERENTIAL/PLATELET
BASOS ABS: 0 10*3/uL (ref 0.0–0.1)
Basophils Relative: 0 % (ref 0–1)
Eosinophils Absolute: 0 10*3/uL (ref 0.0–0.7)
Eosinophils Relative: 0 % (ref 0–5)
HCT: 23.7 % — ABNORMAL LOW (ref 39.0–52.0)
Hemoglobin: 8.2 g/dL — ABNORMAL LOW (ref 13.0–17.0)
LYMPHS ABS: 20.2 10*3/uL — AB (ref 0.7–4.0)
LYMPHS PCT: 65 % — AB (ref 12–46)
MCH: 30.3 pg (ref 26.0–34.0)
MCHC: 34.6 g/dL (ref 30.0–36.0)
MCV: 87.5 fL (ref 78.0–100.0)
MONOS PCT: 4 % (ref 3–12)
Monocytes Absolute: 1.2 10*3/uL — ABNORMAL HIGH (ref 0.1–1.0)
NEUTROS ABS: 9.6 10*3/uL — AB (ref 1.7–7.7)
Neutrophils Relative %: 31 % — ABNORMAL LOW (ref 43–77)
Platelets: 16 10*3/uL — CL (ref 150–400)
RBC: 2.71 MIL/uL — ABNORMAL LOW (ref 4.22–5.81)
RDW: 18.3 % — AB (ref 11.5–15.5)
WBC: 31 10*3/uL — AB (ref 4.0–10.5)

## 2013-09-16 LAB — GLUCOSE, CAPILLARY: GLUCOSE-CAPILLARY: 172 mg/dL — AB (ref 70–99)

## 2013-09-16 MED ORDER — FLUCONAZOLE 100 MG PO TABS
100.0000 mg | ORAL_TABLET | Freq: Every day | ORAL | Status: DC
  Administered 2013-09-16 – 2013-09-18 (×3): 100 mg via ORAL
  Filled 2013-09-16 (×4): qty 1

## 2013-09-16 NOTE — Progress Notes (Signed)
Chaplain paged at [phone] by staff to say that Robert Berger has received bad medical news which has upset him greatly.   When the chaplain entered the room Robert Berger was alone awaiting his significant other and his mother. He read a note written by a physician that stated cancer had entered his blood and he should plan for the worst. Robert Berger added that the physician stated he had a month (give or take) to live. He is in shock and it trying to come to terms with this new knowledge. Among the things he feels he needs to accomplish is execute Medical Advanced Directives, have his will notarized and plan for the distribution of his property. A copy of the Medical Advance Directive was given to him and he was told to page a chaplain to assist him in documenting his wishes and/or discuss the matter with a Education officer, museum.  Chaplain was present when Robert Berger told each of them about his terminal condition. Spiritual Care was provided for both.   Robert. Berger says he is a spiritual person generally, he is not against any religion or faith group, but has little to do with such. He recognizes the chaplain as a [place] Provider, but is not interest at present to deal with "anything religious." Robert Berger, the patient mother, is a Chief of Staff and member of [employer]. She requested that the church be contacted if possible to send someone to provide pastoral care to her and her son.  Some records indicate that Robert Berger has a wife, it is not clear if this reference is to his significant other. Robert Berger indicates he and his significant other have not married because of social security concerns. Pending the execution of a Medical Power of Attorney the clarification of wife is clarified.  Current spiritual and emotional issues include pain tolerance, end-of-life planning, rewriting his list of things he wishes to accomplish before dying, taking care of his significant other and  his mother. He and his significant other are discussing whether to be legally married.  RECOMMEND: A Social Work Scientific laboratory technician and continued [employer]. Chaplain follow-up by weekday chaplains is strongly encouraged to deal with MPA and marriage plans.  PLAN OF CARE: Follow up visits to assist in acceptance of the diagnosis, quality of life planning, and End-of-Life issues. Daily visits are recommended if possible.  Robert Berger. Robert Berger, [place]

## 2013-09-16 NOTE — Progress Notes (Signed)
Clinical Social Work Department BRIEF PSYCHOSOCIAL ASSESSMENT 09/16/2013  Patient:  Robert Berger, Robert Berger     Account Number:  [id number]     Admit date:  09/12/2013  Clinical Social Worker:  Levie Heritage  Date/Time:  09/16/2013 02:18 PM  Referred by:  Physician  Date Referred:  09/16/2013 Referred for  Other - See comment   Other Referral:   End-of-Life questions   Interview type:  Patient Other interview type:   Significant other and mother at bedside    PSYCHOSOCIAL DATA Living Status:  FAMILY Admitted from facility:   Level of care:   Primary support name:  Tiana Loft Primary support relationship to patient:  PARTNER Degree of support available:   strong    CURRENT CONCERNS Current Concerns  Adjustment to Illness   Other Concerns:    SOCIAL WORK ASSESSMENT / PLAN Pt requested to meet with CSW.    Chart reviewed.  Noted that MD told Pt that his medical condition has worsened and it's likely that he only has 1 month to live.  Noted that Chaplain met with Pt and provided Adv [employer].  Noted that MD deferred to Attending MD with regard to a PMT referral.    Pt reported to CSW that he has approx 1 month to live and that he has a lot of questions re: end-of-life and placement.  CSW discussed with Pt the possibility of having the PMT involved with him so that they can assist Pt with these difficult decisions.    CSW answered questions re: residential Hospice facilities, SNF and the PMT's role in end-of-life care.  Pt stated that he will discuss this with his MD tomorrow to determine if he could benefit from this team's service.    Pt had his Adv Dir packet in his room and expressed his intent to complete this while in the hospital.    CSW thanked Pt and his family for their time.   Assessment/plan status:  Psychosocial Support/Ongoing Assessment of Needs Other assessment/ plan:   Information/referral to community resources:   Not appropriate, at this time.     PATIENT'S/FAMILY'S RESPONSE TO PLAN OF CARE: Pt and his family were calm, cooperative and pleasant.    Pt and his family were coping well, considering that they have just learned that Pt doesn't have long to live.  Pt and family trying to be diligent in tieing up loose ends and ensuring that all of Pt's affairs are in order.    CSW provided emotional support, answered questions as best as possible but mainly deferred to Pt's MD.    Pt and his family thanked CSW for time and assistance.   Bernita Raisin, [place] [facility] [phone]

## 2013-09-16 NOTE — Progress Notes (Signed)
LONNY EISEN   DOB:07-20-1941   IO#:[id number]   GHW#:[id number]  Subjective: patient had significant constipation problems yesterday; he does not like dulcolax or daily miraLAX; agrees to increasing stool softeners; the continuous pulse ox is 'driving me crazy" and he wants it off. His wife has not been well and has not been able to visit past several days. Pain moderately controlled on current meds and does not want them increased because he wants to be alert and not 'doped up." Denies h/a, N/V, bleeding.   Objective: middle aged White man examined in bed Filed Vitals:   09/16/13 0451  BP: 121/71  Pulse: 92  Temp: 98.6 F (37 C)  Resp: 18    Body mass index is 27.76 kg/(m^2).  Intake/Output Summary (Last 24 hours) at 09/16/13 0846 Last data filed at 09/16/13 0453  Gross per 24 hour  Intake 1329.58 ml  Output   1300 ml  Net  29.58 ml     Sclerae unicteric, pupils round and equal  Oropharynx shows thrush  No peripheral adenopathy  Lungs clear upper lobes--auscultated anterolaterally  Heart regular rate and rhythm  Abdomen benign, colostomy LLQ  Neuro nonfocal, well oriented x 3, appropriate affect    CBG (last 3)   Recent Labs  09/14/13 0605 09/15/13 0723 09/16/13 0745  GLUCAP 126* 149* 172*     Labs:  Lab Results  Component Value Date   WBC 31.0* 09/16/2013   HGB 8.2* 09/16/2013   HCT 23.7* 09/16/2013   MCV 87.5 09/16/2013   PLT 16* 09/16/2013   NEUTROABS 9.6* 09/16/2013    _0 @  Urine Studies No results found for this basename: UACOL, UAPR, USPG, UPH, UTP, UGL, UKET, UBIL, UHGB, UNIT, UROB, ULEU, UEPI, UWBC, URBC, UBAC, CAST, CRYS, UCOM, BILUA,  in the last 72 hours  Basic Metabolic Panel:  Recent Labs Lab 09/11/13 0455 09/13/13 0510 09/14/13 0505 09/15/13 0510 09/16/13 0452  NA 137 136* 135* 135* 135*  K 3.8 3.8 3.8 4.1 4.1  CL 106 105 105 103 101  CO2 _1 GLUCOSE 117* 173* 149* 146* 213*  BUN _2 28*  CREATININE 0.87  0.77 0.77 0.79 0.92  CALCIUM 7.5* 7.8* 7.9* 7.8* 7.6*   GFR Estimated Creatinine Clearance: 81 ml/min (by C-G formula based on Cr of 0.92). Liver Function Tests:  Recent Labs Lab 09/11/13 0455 09/13/13 0510 09/14/13 0505 09/15/13 0510 09/16/13 0452  AST 29 91* 43* 34 46*  ALT 25 68* 57* 46 52  ALKPHOS 136* 347* 303* 265* 235*  BILITOT 0.7 2.2* 1.0 1.0 1.5*  PROT 8.8* 8.9* 9.2* 9.0* 8.2  ALBUMIN 2.1* 2.1* 2.1* 2.1* 2.0*   No results found for this basename: LIPASE, AMYLASE,  in the last 168 hours No results found for this basename: AMMONIA,  in the last 168 hours Coagulation profile No results found for this basename: INR, PROTIME,  in the last 168 hours  CBC:  Recent Labs Lab 09/11/13 0455 09/13/13 0510 09/14/13 0505 09/15/13 0510 09/16/13 0452  WBC 12.4* 17.8* 24.4* 25.8* 31.0*  NEUTROABS  --   --   --   --  9.6*  HGB 7.8* 7.6* 7.4* 7.1* 8.2*  HCT 24.0* 23.5* 22.2* 21.9* 23.7*  MCV 91.6 91.4 89.9 91.6 87.5  PLT 42* 37* 37* 24* 16*   Cardiac Enzymes: No results found for this basename: CKTOTAL, CKMB, CKMBINDEX, TROPONINI,  in the last 168 hours BNP: No components found with this basename: POCBNP,  CBG:  Recent Labs Lab 09/12/13 0804 09/13/13 0805 09/14/13 0605 09/15/13 0723 09/16/13 0745  GLUCAP 145* 150* 126* 149* 172*   D-Dimer No results found for this basename: DDIMER,  in the last 72 hours Hgb A1c No results found for this basename: HGBA1C,  in the last 72 hours Lipid Profile No results found for this basename: CHOL, HDL, LDLCALC, TRIG, CHOLHDL, LDLDIRECT,  in the last 72 hours Thyroid function studies No results found for this basename: TSH, T4TOTAL, FREET3, T3FREE, THYROIDAB,  in the last 72 hours Anemia work up No results found for this basename: VITAMINB12, FOLATE, FERRITIN, TIBC, IRON, RETICCTPCT,  in the last 72 hours Microbiology No results found for this or any previous visit (from the past 240 hour(s)).    Studies:  No results  found. THORACIC SPINE MRI 09/12/2013 1. Interval worsening of disease with new extraosseous tumor  involving the epidural space at the level of T10 with associated  moderate central canal stenosis. No other areas of cord compression  identified.  2. Moderate bilateral pleural effusions.  3. Right paracentral disc protrusion at T12-L1 without significant  canal or foraminal stenosis.  Critical Value/emergent results were called by telephone at the time  of interpretation on 09/13/2013 at 12:06 AM to Dr. Ruffin Pyo, who  verbally acknowledged these results.   Assessment:/Plan:  73 y.o. 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 Severe lower back pain -- receiving XRT, to be completed 09/25/2013; on fentanyl 100 mcg/h, plus PRN dilaudid available both IV and po; bowel prophylaxis in place  2. Relapsed multiple myeloma.   A. Pancytopenia-- Hb continues to drop, will transfuse today; platelets also dropping-- not clear how much marrow reserve patient has to tolerate aggressive chemotherapy  B. Leukocytosis: review of blood film c/w leukemic transformation of myeloma; path review pending  3. Recent bilateral community-acquired pneumonia.   4. 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 History of pulmonary embolism-- with platelets <50K, not on prophylactic lovenox; SCDs in place  6. Non-insulin-dependent diabetes.   7. Thrush-- diflucan tarted 09/16/2013  7. Advanced directives-- full code currently. -- I spent approximately one hour discussing Mr Panther situation with him. I explained his myeloma appears to be in leukemic transformation. In my experience this is a terminal development. I asked him if his affairs were in order and they appear to be anything but. It will be important for the patient to make sure he has a valid will in place, and that he lets his family know (his children all live far away and have been estranged). He may want  to continue to fight this cancer, but things may go badly and he may become less competent than he is currently. I did not discuss advanced directives with Mr Bushnell but I would recommend a DNR status as we are dealing with an incurable likely terminal situation--Dr Marin Olp may address this tomorrow at his discretion and may consider a palliative care consult to help the patient define and achieve his goals     Chauncey Cruel, MD 09/16/2013  8:46 AM

## 2013-09-17 ENCOUNTER — Encounter: Payer: Self-pay | Admitting: Radiation Oncology

## 2013-09-17 ENCOUNTER — Ambulatory Visit
Admit: 2013-09-17 | Discharge: 2013-09-17 | Disposition: A | Discharge status: Home / Self Care | Payer: BC Managed Care – PPO | Attending: Radiation Oncology | Admitting: Radiation Oncology

## 2013-09-17 ENCOUNTER — Ambulatory Visit: Payer: BC Managed Care – PPO | Admitting: Family

## 2013-09-17 DIAGNOSIS — M549 Dorsalgia, unspecified: Secondary | ICD-10-CM

## 2013-09-17 LAB — COMPREHENSIVE METABOLIC PANEL
ALBUMIN: 2 g/dL — AB (ref 3.5–5.2)
ALK PHOS: 265 U/L — AB (ref 39–117)
ALT: 56 U/L — ABNORMAL HIGH (ref 0–53)
AST: 41 U/L — ABNORMAL HIGH (ref 0–37)
BUN: 34 mg/dL — ABNORMAL HIGH (ref 6–23)
CHLORIDE: 101 meq/L (ref 96–112)
CO2: 27 meq/L (ref 19–32)
Calcium: 7.4 mg/dL — ABNORMAL LOW (ref 8.4–10.5)
Creatinine, Ser: 0.88 mg/dL (ref 0.50–1.35)
GFR, EST NON AFRICAN AMERICAN: 84 mL/min — AB (ref >90)
GLUCOSE: 187 mg/dL — AB (ref 70–99)
POTASSIUM: 4.1 meq/L (ref 3.7–5.3)
Sodium: 135 mEq/L — ABNORMAL LOW (ref 137–147)
Total Bilirubin: 1.1 mg/dL (ref 0.3–1.2)
Total Protein: 8.6 g/dL — ABNORMAL HIGH (ref 6.0–8.3)

## 2013-09-17 LAB — CBC
HCT: 21.8 % — ABNORMAL LOW (ref 39.0–52.0)
HEMOGLOBIN: 7.3 g/dL — AB (ref 13.0–17.0)
MCH: 29.3 pg (ref 26.0–34.0)
MCHC: 33.5 g/dL (ref 30.0–36.0)
MCV: 87.6 fL (ref 78.0–100.0)
Platelets: 11 10*3/uL — CL (ref 150–400)
RBC: 2.49 MIL/uL — ABNORMAL LOW (ref 4.22–5.81)
RDW: 18.8 % — AB (ref 11.5–15.5)
WBC: 29.5 10*3/uL — AB (ref 4.0–10.5)

## 2013-09-17 LAB — GLUCOSE, CAPILLARY: Glucose-Capillary: 151 mg/dL — ABNORMAL HIGH (ref 70–99)

## 2013-09-17 MED ORDER — SODIUM CHLORIDE 0.9 % IV SOLN
8.0000 mg/m2 | INTRAVENOUS | Status: DC
  Administered 2013-09-17 – 2013-09-18 (×2): 16 mg via INTRAVENOUS
  Filled 2013-09-17 (×4): qty 8

## 2013-09-17 MED ORDER — BORTEZOMIB CHEMO IV INJECTION 3.5 MG
1.3000 mg/m2 | INTRAMUSCULAR | Status: DC
  Administered 2013-09-17: 2.7 mg via INTRAVENOUS
  Filled 2013-09-17 (×2): qty 2.7

## 2013-09-17 MED ORDER — HEPARIN SOD (PORK) LOCK FLUSH 100 UNIT/ML IV SOLN: 500.0000 [IU] | Freq: Once | INTRAVENOUS | Status: AC | PRN

## 2013-09-17 MED ORDER — SODIUM CHLORIDE 0.9 % IV SOLN
INTRAVENOUS | Status: DC
  Administered 2013-09-20: 20:00:00 via INTRAVENOUS
  Administered 2013-09-20: 60 mL via INTRAVENOUS

## 2013-09-17 MED ORDER — LORAZEPAM 0.5 MG PO TABS: 0.5000 mg | ORAL_TABLET | Freq: Four times a day (QID) | ORAL | Status: DC | PRN

## 2013-09-17 MED ORDER — ACYCLOVIR 200 MG PO CAPS
400.0000 mg | ORAL_CAPSULE | Freq: Two times a day (BID) | ORAL | Status: DC
  Administered 2013-09-17 – 2013-09-21 (×8): 400 mg via ORAL
  Filled 2013-09-17 (×12): qty 2

## 2013-09-17 MED ORDER — ONDANSETRON HCL 8 MG PO TABS: 8.0000 mg | ORAL_TABLET | Freq: Two times a day (BID) | ORAL | Status: DC

## 2013-09-17 MED ORDER — SODIUM CHLORIDE 0.9 % IV SOLN
INTRAVENOUS | Status: DC
  Administered 2013-09-17 – 2013-09-18 (×2): 17 mg via INTRAVENOUS
  Filled 2013-09-17 (×4): qty 17

## 2013-09-17 MED ORDER — HOT PACK MISC ONCOLOGY
1.0000 | Freq: Once | Status: AC | PRN
  Filled 2013-09-17: qty 1

## 2013-09-17 MED ORDER — HEPARIN SOD (PORK) LOCK FLUSH 100 UNIT/ML IV SOLN: 250.0000 [IU] | Freq: Once | INTRAVENOUS | Status: AC | PRN

## 2013-09-17 MED ORDER — SODIUM CHLORIDE 0.9 % IJ SOLN: 10.0000 mL | INTRAMUSCULAR | Status: DC | PRN

## 2013-09-17 MED ORDER — SODIUM CHLORIDE 0.9 % IJ SOLN: 3.0000 mL | INTRAMUSCULAR | Status: DC | PRN

## 2013-09-17 MED ORDER — ALTEPLASE 2 MG IJ SOLR
2.0000 mg | Freq: Once | INTRAMUSCULAR | Status: AC | PRN
  Filled 2013-09-17: qty 2

## 2013-09-17 MED ORDER — MAGNESIUM SULFATE 50 % IJ SOLN
INTRAMUSCULAR | Status: AC
Start: 2013-09-17 — End: 2013-09-21
  Administered 2013-09-17 – 2013-09-19 (×3): via INTRAVENOUS
  Filled 2013-09-17 (×4): qty 10

## 2013-09-17 MED ORDER — DEXAMETHASONE 4 MG PO TABS: 8.0000 mg | ORAL_TABLET | Freq: Two times a day (BID) | ORAL | Status: DC

## 2013-09-17 MED ORDER — PALONOSETRON HCL INJECTION 0.25 MG/5ML
0.2500 mg | INTRAVENOUS | Status: AC
  Administered 2013-09-17: 0.25 mg via INTRAVENOUS
  Filled 2013-09-17 (×2): qty 5

## 2013-09-17 MED ORDER — SODIUM CHLORIDE 0.9 % IV SOLN
150.0000 mg | Freq: Once | INTRAVENOUS | Status: AC
  Administered 2013-09-17: 150 mg via INTRAVENOUS
  Filled 2013-09-17: qty 5

## 2013-09-17 MED ORDER — COLD PACK MISC ONCOLOGY
1.0000 | Freq: Once | Status: AC | PRN
  Filled 2013-09-17: qty 1

## 2013-09-17 MED ORDER — PROCHLORPERAZINE MALEATE 10 MG PO TABS: 10.0000 mg | ORAL_TABLET | Freq: Four times a day (QID) | ORAL | Status: DC | PRN

## 2013-09-17 MED ORDER — IPRATROPIUM-ALBUTEROL 0.5-2.5 (3) MG/3ML IN SOLN
3.0000 mL | Freq: Four times a day (QID) | RESPIRATORY_TRACT | Status: DC | PRN
  Administered 2013-09-18: 3 mL via RESPIRATORY_TRACT
  Filled 2013-09-17: qty 3

## 2013-09-17 MED ORDER — ACYCLOVIR 400 MG PO TABS: 400.0000 mg | ORAL_TABLET | Freq: Two times a day (BID) | ORAL | Status: DC

## 2013-09-17 NOTE — Progress Notes (Signed)
Chemotherapy ordered to begin today, after PICC line placed. Pre-meds given, Velcade given through [facility] after checking blood return, then Adri and Cisplatin mixture administered through new PICC line with good blood return noted. Patient is tolerating well so far.

## 2013-09-17 NOTE — Progress Notes (Signed)
09/17/13 1100  Clinical Encounter Type  Visited With Patient;Patient and family together (mother Inez Catalina)  Visit Type Follow-up;Spiritual support;Social support  Referral From Chaplain (Tylan Lumpkin, DMin)  Spiritual Encounters  Spiritual Needs Emotional (pt begining to work through [employer] questions)  Stress Factors  Patient Stress Factors Loss of control;Major life changes;Health changes (facing EOL concerns)   Followed up with Mr Summons per referral from [place] to offer further spiritual, emotional, and Advance Directives support.  Per pt, he was heartened to hear from a second physician that the initial one-month prognosis might not be accurate; he is working to process the initial news of the severity of his condition, as well as concerns for EOL financial/legal arrangements and meaning-making to promote quality of life with the time he has.  Sources of meaning include his SO/wife, his mother Inez Catalina, his three dogs and cat, his career in Mining engineer, and his current project of building a computer.  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  He has selected two healthcare agents, but needs more time/assistance to discern about his preferences for the Living Will portion of his Advance Directives.  [facility] will follow up on these questions today, if possible, or tomorrow, but please also page as needs arise:  [phone].  Thank you.  [place], [place]

## 2013-09-17 NOTE — Progress Notes (Signed)
PT Cancellation Note  Patient Details Name: [name] MRN: [id number] DOB: 10/01/1940   Cancelled Treatment:    Reason Eval/Treat Not Completed: Patient at procedure or test/unavailable   Tripton Ned,KATHrine E 09/17/2013, 2:10 PM Carmelia Bake, PT, DPT 09/17/2013 Pager: [phone]

## 2013-09-17 NOTE — Progress Notes (Signed)
   Weekly Management Note:  Inpatient Current Dose:  9 Gy  Projected Dose: 22.5 Gy   Narrative:  The patient presents for routine under treatment assessment.  CBCT/MVCT images/Port film x-rays were reviewed.  The chart was checked. He denies nausea or diarrhea. Had some constipation relieved w/ Miralax.  No discernible pain relief so far from RT. He reports Dr. Marin Olp may start chemotherapy soon due to aggressiveness of his cancer. 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   Physical Findings:  height is 5\' 10"  (1.778 m) and weight is 193 lb 8 oz (87.771 kg). His oral temperature is 97.8 F (36.6 C). His blood pressure is 138/72 and his pulse is 112. His respiration is 20 and oxygen saturation is 97%.  NAD, lying in gurney, no skin changes anteriorly in treatment fields  CBC    Component Value Date/Time   WBC 29.5* 09/17/2013 0535   WBC 18.6* 08/23/2013 1017   RBC 2.49* 09/17/2013 0535   RBC 2.45* 08/25/2013 2145   HGB 7.3* 09/17/2013 0535   HGB 9.0* 08/23/2013 1017   HCT 21.8* 09/17/2013 0535   HCT 27.3* 08/23/2013 1017   PLT 11* 09/17/2013 0535   PLT 45* 08/23/2013 1017   MCV 87.6 09/17/2013 0535   MCV 97 08/23/2013 1017   MCH 29.3 09/17/2013 0535   MCH 31.9 08/23/2013 1017   MCHC 33.5 09/17/2013 0535   MCHC 33.0 08/23/2013 1017   RDW 18.8* 09/17/2013 0535   RDW 19.7* 08/23/2013 1017   LYMPHSABS 20.2* 09/16/2013 0452   LYMPHSABS 0.3* 07/25/2013 1033   MONOABS 1.2* 09/16/2013 0452   EOSABS 0.0 09/16/2013 0452   EOSABS 0.2 07/25/2013 1033   BASOSABS 0.0 09/16/2013 0452   BASOSABS 0.0 07/25/2013 1033     CMP     Component Value Date/Time   NA 135* 09/17/2013 0535   NA 135* 08/23/2013 1229   NA 143 08/13/2013 1317   K 4.1 09/17/2013 0535   K 3.9 08/23/2013 1229   K 4.1 08/13/2013 1317   CL 101 09/17/2013 0535   CL 103 08/13/2013 1317   CO2 27 09/17/2013 0535   CO2 22 08/23/2013 1229   CO2 32 08/13/2013 1317   GLUCOSE 187* 09/17/2013 0535   GLUCOSE 153* 08/23/2013 1229   GLUCOSE 139* 08/13/2013 1317   BUN 34*  09/17/2013 0535   BUN 9.8 08/23/2013 1229   BUN 15 08/13/2013 1317   CREATININE 0.88 09/17/2013 0535   CREATININE 0.9 08/23/2013 1229   CREATININE 0.7 08/13/2013 1317   CALCIUM 7.4* 09/17/2013 0535   CALCIUM 8.8 08/23/2013 1229   CALCIUM 8.9 08/13/2013 1317   PROT 8.6* 09/17/2013 0535   PROT 8.9* 08/23/2013 1229   PROT 7.2 08/13/2013 1317   ALBUMIN 2.0* 09/17/2013 0535   ALBUMIN 2.5* 08/23/2013 1229   AST 41* 09/17/2013 0535   AST 51* 08/23/2013 1229   AST 36 08/13/2013 1317   ALT 56* 09/17/2013 0535   ALT 34 08/23/2013 1229   ALT 41 08/13/2013 1317   ALKPHOS 265* 09/17/2013 0535   ALKPHOS 176* 08/23/2013 1229   ALKPHOS 108* 08/13/2013 1317   BILITOT 1.1 09/17/2013 0535   BILITOT 0.63 08/23/2013 1229   BILITOT 0.70 08/13/2013 1317   GFRNONAA 84* 09/17/2013 0535   GFRAA >90 09/17/2013 0535     Impression:  The patient is tolerating radiotherapy.   Plan:  Continue radiotherapy as planned.    -----------------------------------  Eppie Gibson, MD

## 2013-09-17 NOTE — Progress Notes (Addendum)
Mr. Hufford has received 4 fractions to his T6-L3 spine.  He is lying on a hospital bed at the present time.  He c/p pain in his lumbar spine as alevel 8 on a scale of 0-10 despite receiving IV dilaudid prior to his radiation therapy.  IV 0.9 NS infusing via his right chest port-a-cath at 20 ml/ hour.  O2 via [place] at 4 liters/min.   He also reports that he feels confused today.  He reports that he had constipation relieved with Miralax, but denies any N&V.

## 2013-09-17 NOTE — Progress Notes (Signed)
Peripherally Inserted Central Catheter/Midline Placement  The IV Nurse has discussed with the patient and/or persons authorized to consent for the patient, the purpose of this procedure and the potential benefits and risks involved with this procedure.  The benefits include less needle sticks, lab draws from the catheter and patient may be discharged home with the catheter.  Risks include, but not limited to, infection, bleeding, blood clot (thrombus formation), and puncture of an artery; nerve damage and irregular heat beat.  Alternatives to this procedure were also discussed.  PICC/Midline Placement Documentation  PICC / Midline Double Lumen 09/17/13 PICC Left Brachial 45 cm 2 cm (Active)  Indication for Insertion or Continuance of Line Administration of hyperosmolar/irritating solutions (i.e. TPN, Vancomycin, etc.) 09/17/2013  4:05 PM  Exposed Catheter (cm) 2 cm 09/17/2013  4:05 PM  Dressing Change Due 09/24/13 09/17/2013  4:05 PM       Poff, Mordecai Rasmussen 09/17/2013, 4:20 PM

## 2013-09-17 NOTE — Progress Notes (Signed)
I had a long talk with Robert Berger today. He did okay over the weekend. He's not had any further chest pain. He was getting a little volume overloaded. We restarted his Lasix.   He is still quite weak. He's still having the back pain. We may have to increase his Duragesic patch. He started radiation therapy last week to his thoracic spine.  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 His blood counts continue to drop. He was transfused with 2 units of blood over the weekend. His hemoglobin was back down. His platelet count is dropping. He's had no bleeding but a lot of bruising.  He understands that this situation is not looking too good. He knows that whatever we do will not cure this myeloma. He's not a candidate for stem cell transplantation. He has a performance status of ECOG 2. He did get a new brace that hopefully will allow him to be a little more mobile. I appreciate physical therapy helping [place] out. Hopefully he can be a little bit more ambulatory  His appetite is improving. He's had no nausea vomiting.  His vital signs look okay. Blood pressure 140/78. Pulse is 91. Temperature 97.6. Lungs are clear bilaterally. Cardiac exam shows a slightly tachycardic rate with no murmurs rubs or bruits. Abdomen is soft. His colostomy is intact. Bowel sounds are active. Extremities shows some muscle atrophy in upper or lower extremities. Strength is 4/5 in his legs. He has decent range of motion of his joints. Neurological exam shows no focal neurological deficits.  With his labs, his BUN is 34 and creatinine 0.9. Albumin is 2. White cell count 29.5. Hemoglobin 7.3. Platelet count 11.  We will go ahead and start him on what I really feel his last line chemotherapy. This will be quite aggressive. His myeloma now has multiple chromosomal abnormalities. We see plasma cells in the blood.  I went over the regimen. I will use VD-PACE which will have to  be dose reduced because of his age and his performance status. He will lose his hair. He will become profoundly pancytopenic. He will need Neupogen. He will need to be transfused. We will have to be careful with his fluid balance. We will need to watch his kidney function closely.  I doubt that he will be discharged any time soon.  He will continue radiation. I don't see any problems with the chemotherapy overlapping radiation.  I will need to talk to his significant other about his status.  IP we will know within a month whether or not he is going to improve or continue to decline.  Robert E.

## 2013-09-17 NOTE — Progress Notes (Signed)
09/17/12 0836  PICC  Team refuses to do PICC until MD states more specific reasons for PICC.

## 2013-09-18 ENCOUNTER — Ambulatory Visit
Admit: 2013-09-18 | Discharge: 2013-09-18 | Disposition: A | Discharge status: Home / Self Care | Payer: BC Managed Care – PPO | Attending: Radiation Oncology | Admitting: Radiation Oncology

## 2013-09-18 ENCOUNTER — Encounter: Payer: Self-pay | Admitting: Radiation Oncology

## 2013-09-18 ENCOUNTER — Inpatient Hospital Stay (HOSPITAL_COMMUNITY): Payer: Medicare Other

## 2013-09-18 DIAGNOSIS — D649 Anemia, unspecified: Secondary | ICD-10-CM

## 2013-09-18 LAB — COMPREHENSIVE METABOLIC PANEL
ALK PHOS: 267 U/L — AB (ref 39–117)
ALT: 56 U/L — ABNORMAL HIGH (ref 0–53)
AST: 42 U/L — AB (ref 0–37)
Albumin: 2 g/dL — ABNORMAL LOW (ref 3.5–5.2)
BUN: 38 mg/dL — ABNORMAL HIGH (ref 6–23)
CO2: 26 mEq/L (ref 19–32)
Calcium: 7.3 mg/dL — ABNORMAL LOW (ref 8.4–10.5)
Chloride: 104 mEq/L (ref 96–112)
Creatinine, Ser: 0.82 mg/dL (ref 0.50–1.35)
GFR calc non Af Amer: 86 mL/min — ABNORMAL LOW (ref >90)
GLUCOSE: 179 mg/dL — AB (ref 70–99)
POTASSIUM: 4.4 meq/L (ref 3.7–5.3)
Sodium: 136 mEq/L — ABNORMAL LOW (ref 137–147)
Total Bilirubin: 0.9 mg/dL (ref 0.3–1.2)
Total Protein: 8.4 g/dL — ABNORMAL HIGH (ref 6.0–8.3)

## 2013-09-18 LAB — CBC
HCT: 19.4 % — ABNORMAL LOW (ref 39.0–52.0)
HEMOGLOBIN: 6.6 g/dL — AB (ref 13.0–17.0)
MCH: 29.9 pg (ref 26.0–34.0)
MCHC: 34 g/dL (ref 30.0–36.0)
MCV: 87.8 fL (ref 78.0–100.0)
PLATELETS: 24 10*3/uL — AB (ref 150–400)
RBC: 2.21 MIL/uL — AB (ref 4.22–5.81)
RDW: 19.2 % — ABNORMAL HIGH (ref 11.5–15.5)
WBC: 27.8 10*3/uL — ABNORMAL HIGH (ref 4.0–10.5)

## 2013-09-18 LAB — PREPARE PLATELET PHERESIS: Unit division: 0

## 2013-09-18 LAB — PREPARE RBC (CROSSMATCH)

## 2013-09-18 LAB — GLUCOSE, CAPILLARY: Glucose-Capillary: 159 mg/dL — ABNORMAL HIGH (ref 70–99)

## 2013-09-18 MED ORDER — BIOTENE DRY MOUTH MT LIQD
15.0000 mL | OROMUCOSAL | Status: DC
  Administered 2013-09-18 – 2013-09-19 (×4): 15 mL via OROMUCOSAL

## 2013-09-18 MED ORDER — SODIUM CHLORIDE 0.9 % IV SOLN
500.0000 mg | Freq: Four times a day (QID) | INTRAVENOUS | Status: DC
  Administered 2013-09-18 – 2013-09-21 (×11): 500 mg via INTRAVENOUS
  Filled 2013-09-18 (×14): qty 500

## 2013-09-18 MED ORDER — LORAZEPAM 2 MG/ML IJ SOLN
INTRAMUSCULAR | Status: AC
  Administered 2013-09-18: 0.5 mg
  Filled 2013-09-18: qty 1

## 2013-09-18 MED ORDER — SODIUM BICARBONATE/SODIUM CHLORIDE MOUTHWASH
OROMUCOSAL | Status: DC
  Administered 2013-09-18 – 2013-09-19 (×5): via OROMUCOSAL
  Filled 2013-09-18: qty 1000

## 2013-09-18 MED ORDER — IPRATROPIUM-ALBUTEROL 0.5-2.5 (3) MG/3ML IN SOLN
3.0000 mL | Freq: Four times a day (QID) | RESPIRATORY_TRACT | Status: DC
  Administered 2013-09-18 – 2013-09-21 (×10): 3 mL via RESPIRATORY_TRACT
  Filled 2013-09-18 (×10): qty 3

## 2013-09-18 MED ORDER — FUROSEMIDE 10 MG/ML IJ SOLN
40.0000 mg | Freq: Once | INTRAMUSCULAR | Status: AC
  Administered 2013-09-18: 40 mg via INTRAVENOUS
  Filled 2013-09-18 (×2): qty 4

## 2013-09-18 MED ORDER — LORAZEPAM 2 MG/ML IJ SOLN
0.5000 mg | Freq: Once | INTRAMUSCULAR | Status: AC
  Administered 2013-09-18: 0.5 mg via INTRAVENOUS

## 2013-09-18 NOTE — Progress Notes (Signed)
PT Cancellation Note  ___Treatment cancelled today due to medical issues with patient which prohibited therapy  ___ Treatment cancelled today due to patient receiving procedure or test   ___ Treatment cancelled today due to patient's refusal to participate   _X_ Treatment cancelled today due to multiple issues: am c/o chest pains and SOB.           Will check back tomorrow  Rica Koyanagi  PTA [facility]  Acute  Rehab Pager      [phone]

## 2013-09-18 NOTE — Progress Notes (Signed)
At 10:15 this am, pt complained of inability to breathe and stated he wanted chemotherapy stopped. O2 sat measured at 99% but pulse 124. Chemo stopped and MD informed. Respiratory treatment given, 0.5 ativan given and 2 mg dilaudid given at 1030. X-ray ordered and showed patch bilateral infiltrates. Patient gradually became more calm and coherent. Lengthy discussion resulted in chemo being restarted at 1:30 pm with the intention of re-evaluating whether he is experiencing a side-effect of the chemo. MD aware of the episode.

## 2013-09-18 NOTE — Progress Notes (Signed)
Rt called to give neb tx. Pt appears to be SOB. Vitals stable, rn at bedside , x-ray order, bs wh. Rt will monitor as needed.

## 2013-09-18 NOTE — Progress Notes (Signed)
Robert Berger started chemo yesterday. So far, it is done well with it.  He also radiation therapy yesterday.  He wants try to have physical therapy in the morning if possible.  His hemoglobin is 6.6. We'll have to transfuse him 2 units of blood. I don't see any source of the bleeding. There is no abdominal pain. He still has the back pain but this does not seem to be as bad. His appetite is improving. There's no nausea vomiting. There is had no fever. He's had no mouth sores.  On his physical exam, temperature 97.4. Blood pressure 137/74. Pulse is 84. Head and neck exam shows no ocular or oral lesions. There is no scleral icterus. He has no thrush. There is no adenopathy in the neck. Lungs are clear bilaterally. Cardiac exam regular rate and rhythm with no murmurs rubs or bruits. Abdomen is soft. He has decent bowel sounds. 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 There is no guarding or rebound tenderness. There is no palpable hepatospleno megaly extremities shows no clubbing cyanosis or edema. He has decent strength in his legs. He has good range of motion of his joints. Neurological exam no focal neurological deficits.  We will continue his chemotherapy. We will transfuse with 2 units of packed red blood cells.  We have to watch his intake and output closely.  [place] 55:22

## 2013-09-18 NOTE — Progress Notes (Signed)
CRITICAL VALUE ALERT  Critical value received:  hgb 6.6, platelet 24  Date of notification:  09/18/13  Time of notification:  0550  Critical value read back:yes  Nurse who received alert:  Harlow Asa  MD notified (1st page):  Ennever  Time of first page:  0645  MD notified (2nd page):  Time of second page:  Responding MD:  Marin Olp  Time MD responded:  [phone]

## 2013-09-19 ENCOUNTER — Other Ambulatory Visit: Payer: BC Managed Care – PPO | Admitting: Lab

## 2013-09-19 ENCOUNTER — Inpatient Hospital Stay (HOSPITAL_COMMUNITY): Payer: Medicare Other

## 2013-09-19 ENCOUNTER — Ambulatory Visit: Payer: BC Managed Care – PPO | Admitting: Hematology & Oncology

## 2013-09-19 ENCOUNTER — Ambulatory Visit: Payer: BC Managed Care – PPO

## 2013-09-19 ENCOUNTER — Ambulatory Visit: Admit: 2013-09-19 | Payer: BC Managed Care – PPO

## 2013-09-19 DIAGNOSIS — I5031 Acute diastolic (congestive) heart failure: Secondary | ICD-10-CM

## 2013-09-19 DIAGNOSIS — R0902 Hypoxemia: Secondary | ICD-10-CM

## 2013-09-19 DIAGNOSIS — I509 Heart failure, unspecified: Secondary | ICD-10-CM

## 2013-09-19 DIAGNOSIS — R042 Hemoptysis: Secondary | ICD-10-CM

## 2013-09-19 DIAGNOSIS — E8589 Other amyloidosis: Secondary | ICD-10-CM

## 2013-09-19 DIAGNOSIS — J9600 Acute respiratory failure, unspecified whether with hypoxia or hypercapnia: Secondary | ICD-10-CM

## 2013-09-19 DIAGNOSIS — R4182 Altered mental status, unspecified: Secondary | ICD-10-CM

## 2013-09-19 DIAGNOSIS — D696 Thrombocytopenia, unspecified: Secondary | ICD-10-CM

## 2013-09-19 DIAGNOSIS — J99 Respiratory disorders in diseases classified elsewhere: Secondary | ICD-10-CM

## 2013-09-19 LAB — BLOOD GAS, ARTERIAL
Acid-base deficit: 3.9 mmol/L — ABNORMAL HIGH (ref 0.0–2.0)
Acid-base deficit: 6.2 mmol/L — ABNORMAL HIGH (ref 0.0–2.0)
BICARBONATE: 18.6 meq/L — AB (ref 20.0–24.0)
Bicarbonate: 20.9 mEq/L (ref 20.0–24.0)
DRAWN BY: 277551
Drawn by: 257701
FIO2: 100 %
LHR: 18 {breaths}/min
MECHVT: 560 mL
O2 Content: 4 L/min
O2 SAT: 99.4 %
O2 Saturation: 93.3 %
PATIENT TEMPERATURE: 98.6
PEEP/CPAP: 5 cmH2O
PH ART: 7.336 — AB (ref 7.350–7.450)
Patient temperature: 36.4
TCO2: 20.3 mmol/L (ref 0–100)
TCO2: 18.2 mmol/L (ref 0–100)
pCO2 arterial: 39.5 mmHg (ref 35.0–45.0)
pCO2 arterial: 35.5 mmHg (ref 35.0–45.0)
pH, Arterial: 7.342 — ABNORMAL LOW (ref 7.350–7.450)
pO2, Arterial: 71.5 mmHg — ABNORMAL LOW (ref 80.0–100.0)
pO2, Arterial: 267 mmHg — ABNORMAL HIGH (ref 80.0–100.0)

## 2013-09-19 LAB — TYPE AND SCREEN
ABO/RH(D): A POS
Antibody Screen: NEGATIVE
UNIT DIVISION: 0
UNIT DIVISION: 0
UNIT DIVISION: 0
Unit division: 0

## 2013-09-19 LAB — GLUCOSE, CAPILLARY
GLUCOSE-CAPILLARY: 242 mg/dL — AB (ref 70–99)
Glucose-Capillary: 197 mg/dL — ABNORMAL HIGH (ref 70–99)

## 2013-09-19 LAB — COMPREHENSIVE METABOLIC PANEL
ALK PHOS: 240 U/L — AB (ref 39–117)
ALT: 55 U/L — ABNORMAL HIGH (ref 0–53)
AST: 44 U/L — ABNORMAL HIGH (ref 0–37)
Albumin: 2 g/dL — ABNORMAL LOW (ref 3.5–5.2)
BILIRUBIN TOTAL: 0.7 mg/dL (ref 0.3–1.2)
BUN: 37 mg/dL — AB (ref 6–23)
CO2: 25 meq/L (ref 19–32)
Calcium: 6.8 mg/dL — ABNORMAL LOW (ref 8.4–10.5)
Chloride: 104 mEq/L (ref 96–112)
Creatinine, Ser: 0.79 mg/dL (ref 0.50–1.35)
GFR calc non Af Amer: 88 mL/min — ABNORMAL LOW (ref >90)
Glucose, Bld: 229 mg/dL — ABNORMAL HIGH (ref 70–99)
POTASSIUM: 4.5 meq/L (ref 3.7–5.3)
Sodium: 136 mEq/L — ABNORMAL LOW (ref 137–147)
Total Protein: 7.8 g/dL (ref 6.0–8.3)

## 2013-09-19 LAB — CBC
HEMATOCRIT: 23.3 % — AB (ref 39.0–52.0)
Hemoglobin: 7.8 g/dL — ABNORMAL LOW (ref 13.0–17.0)
MCH: 29.1 pg (ref 26.0–34.0)
MCHC: 33.5 g/dL (ref 30.0–36.0)
MCV: 86.9 fL (ref 78.0–100.0)
Platelets: 13 10*3/uL — CL (ref 150–400)
RBC: 2.68 MIL/uL — ABNORMAL LOW (ref 4.22–5.81)
RDW: 17.1 % — ABNORMAL HIGH (ref 11.5–15.5)
WBC: 20.9 10*3/uL — AB (ref 4.0–10.5)

## 2013-09-19 LAB — LACTATE DEHYDROGENASE: LDH: 366 U/L — ABNORMAL HIGH (ref 94–250)

## 2013-09-19 MED ORDER — SODIUM CHLORIDE 0.9 % IV SOLN
1.0000 g/h | Freq: Once | INTRAVENOUS | Status: AC
  Administered 2013-09-19: 1 g/h via INTRAVENOUS
  Filled 2013-09-19: qty 20

## 2013-09-19 MED ORDER — SODIUM CHLORIDE 0.9 % IV SOLN
25.0000 ug/h | INTRAVENOUS | Status: DC
  Administered 2013-09-19: 100 ug/h via INTRAVENOUS
  Administered 2013-09-20: 200 ug/h via INTRAVENOUS
  Administered 2013-09-20: 400 ug/h via INTRAVENOUS
  Administered 2013-09-21 (×4): 350 ug/h via INTRAVENOUS
  Filled 2013-09-19 (×7): qty 50

## 2013-09-19 MED ORDER — SUCCINYLCHOLINE CHLORIDE 20 MG/ML IJ SOLN
INTRAMUSCULAR | Status: AC
  Filled 2013-09-19: qty 1

## 2013-09-19 MED ORDER — BIOTENE DRY MOUTH MT LIQD
15.0000 mL | Freq: Four times a day (QID) | OROMUCOSAL | Status: DC
  Administered 2013-09-20 – 2013-09-21 (×6): 15 mL via OROMUCOSAL

## 2013-09-19 MED ORDER — NOREPINEPHRINE BITARTRATE 1 MG/ML IJ SOLN
2.0000 ug/min | INTRAMUSCULAR | Status: DC
  Administered 2013-09-19: 20 ug/min via INTRAVENOUS
  Filled 2013-09-19: qty 4

## 2013-09-19 MED ORDER — FENTANYL CITRATE 0.05 MG/ML IJ SOLN
INTRAMUSCULAR | Status: AC
  Administered 2013-09-19: 100 ug
  Filled 2013-09-19: qty 4

## 2013-09-19 MED ORDER — PANTOPRAZOLE SODIUM 40 MG IV SOLR
40.0000 mg | Freq: Two times a day (BID) | INTRAVENOUS | Status: DC
  Administered 2013-09-19 – 2013-09-21 (×5): 40 mg via INTRAVENOUS
  Filled 2013-09-19 (×8): qty 40

## 2013-09-19 MED ORDER — AMINOCAPROIC ACID 250 MG/ML IV SOLN
1.0000 g/h | Freq: Three times a day (TID) | INTRAVENOUS | Status: DC
  Administered 2013-09-19 – 2013-09-21 (×4): 1 g/h via INTRAVENOUS
  Filled 2013-09-19 (×6): qty 20

## 2013-09-19 MED ORDER — CHLORHEXIDINE GLUCONATE 0.12 % MT SOLN
15.0000 mL | Freq: Two times a day (BID) | OROMUCOSAL | Status: DC
  Administered 2013-09-19 – 2013-09-20 (×3): 15 mL via OROMUCOSAL
  Filled 2013-09-19 (×3): qty 15

## 2013-09-19 MED ORDER — ROCURONIUM BROMIDE 50 MG/5ML IV SOLN
INTRAVENOUS | Status: AC
  Administered 2013-09-19: 10 mg
  Filled 2013-09-19: qty 2

## 2013-09-19 MED ORDER — NOREPINEPHRINE BITARTRATE 1 MG/ML IJ SOLN
2.0000 ug/min | INTRAVENOUS | Status: AC
  Administered 2013-09-19: 10 ug/min via INTRAVENOUS
  Administered 2013-09-20: 15 ug/min via INTRAVENOUS
  Administered 2013-09-20: 20 ug/min via INTRAVENOUS
  Filled 2013-09-19 (×3): qty 4

## 2013-09-19 MED ORDER — LIDOCAINE HCL (CARDIAC) 20 MG/ML IV SOLN
INTRAVENOUS | Status: AC
  Filled 2013-09-19: qty 5

## 2013-09-19 MED ORDER — MIDAZOLAM HCL 2 MG/2ML IJ SOLN
INTRAMUSCULAR | Status: AC
  Administered 2013-09-19: 3 mg
  Filled 2013-09-19: qty 4

## 2013-09-19 MED ORDER — INSULIN ASPART 100 UNIT/ML ~~LOC~~ SOLN
0.0000 [IU] | SUBCUTANEOUS | Status: DC
  Administered 2013-09-19 – 2013-09-20 (×2): 5 [IU] via SUBCUTANEOUS
  Administered 2013-09-20: 8 [IU] via SUBCUTANEOUS
  Administered 2013-09-20: 5 [IU] via SUBCUTANEOUS
  Administered 2013-09-20 (×3): 8 [IU] via SUBCUTANEOUS
  Administered 2013-09-20: 3 [IU] via SUBCUTANEOUS
  Administered 2013-09-21: 5 [IU] via SUBCUTANEOUS
  Administered 2013-09-21: 8 [IU] via SUBCUTANEOUS

## 2013-09-19 MED ORDER — ETOMIDATE 2 MG/ML IV SOLN
INTRAVENOUS | Status: AC
  Administered 2013-09-19: 20 mg
  Filled 2013-09-19: qty 20

## 2013-09-19 MED ORDER — SODIUM CHLORIDE 0.9 % IV SOLN
1.0000 mg/h | INTRAVENOUS | Status: DC
  Administered 2013-09-20 (×2): 2 mg/h via INTRAVENOUS
  Filled 2013-09-19 (×2): qty 10

## 2013-09-19 MED ORDER — LORAZEPAM 2 MG/ML IJ SOLN
0.5000 mg | Freq: Three times a day (TID) | INTRAMUSCULAR | Status: DC | PRN
  Administered 2013-09-19: 0.5 mg via INTRAVENOUS

## 2013-09-19 MED ORDER — LORAZEPAM 2 MG/ML IJ SOLN: 0.5000 mg | Freq: Four times a day (QID) | INTRAMUSCULAR | Status: DC | PRN

## 2013-09-19 MED ORDER — LORAZEPAM 2 MG/ML IJ SOLN
INTRAMUSCULAR | Status: AC
  Administered 2013-09-19: 0.5 mg via INTRAVENOUS
  Filled 2013-09-19: qty 1

## 2013-09-19 NOTE — Consult Note (Signed)
Name: Robert Berger MRN: [id number] DOB: [birth year]    ADMISSION DATE:  08/31/2013 CONSULTATION DATE:  09/19/13  REFERRING MD :  Dr. Marin Olp PRIMARY SERVICE:  Oncology  CHIEF COMPLAINT:  Abnormal CT Chest  BRIEF PATIENT DESCRIPTION: 73 y/o M admitted on 1/30 after relapse of Multiple Myeloma (on salvage chemo) and low back pain.  Recent admit with CAP from 1/17-1/26.    SIGNIFICANT EVENTS / STUDIES:  1/17-1/26 - Admit with CAP ................................................................................................ 1/30 - Admit from [place] office for back pain management in setting of relapsed MM on salvage chemo    CULTURES:   ANTIBIOTICS: Primaxin 2/10>>> Diflucan 2/8>>> Acyclovir 2/9>>>  HISTORY OF PRESENT ILLNESS:  73 y/o M with PMH of GERD, PE/DVT, Severe low back pain, Ruptured Diverticulum s/p colostomy, NIDDM and relapsed IgG kappa myeloma who was admitted on 1/30 from the [place] office with complaints of severe low back pain.  He was recently admitted from 1/17-1/26 with CAP. CT scan on 1/18 demonstrated bilateral patchy airspace disease concerning for infectious etiology. He was previously doing well with maintenence therapy for MM.  However, he had relapse with rapid recurrence with lumbosacral mass and disease throughout the spine.  He was started on salvage chemotherapy with Kyprolis/Cytoxan/Decadron.    2/11 patient was noted to have decreased mental status and cxr with worsening of bilateral infiltrates. He was given ativan after an episode of anxiety.  PCCM consulted for bilateral pulmonary infiltrates.    Patient is altered on exam.  Medical history and information obtained from girlfriend of 73 years and mother at bedside as well as previous medical records.  PAST MEDICAL HISTORY :  Past Medical History  Diagnosis Date  . Arthritis   . GERD (gastroesophageal reflux disease)   . Complication of anesthesia     aspiration during surgery, difficult to wake  up  . Multiple myeloma   . Status post radiation therapy 03/23/12 - 04/05/12    T8 - L4/ 20 Gy/ 10 Fractions  . Dysphagia 05/06/12    ED Visit  . DVT (deep venous thrombosis)   . PE (pulmonary embolism)   . Cellulitis   . Abscess     Left arm and groin area  . History of radiation therapy 07/24/13-08/08/13    25 gray to lower lumbar spine and upper sacrum region  . Spinal cord compression     T10   Past Surgical History  Procedure Laterality Date  . Hemorrhoid surgery    . Laparotomy  05/14/2012    Procedure: EXPLORATORY LAPAROTOMY;  Surgeon: Earnstine Regal, MD;  Location: [facility];  Service: General;  Laterality: N/A;  . Colostomy  05/14/2012    Procedure: COLOSTOMY;  Surgeon: Earnstine Regal, MD;  Location: [facility];  Service: General;  Laterality: N/A;  . Back surgery      47 years ago  . Fracture surgery      ankle   Prior to Admission medications   Medication Sig Start Date End Date Taking? Authorizing Provider  acyclovir (ZOVIRAX) 400 MG tablet Take 1 tablet (400 mg total) by mouth daily. 08/23/13  Yes Volanda Napoleon, MD  aspirin 81 MG tablet Take 162 mg by mouth every morning.    Yes Historical Provider, MD  baclofen (LIORESAL) 10 MG tablet Take 10 mg by mouth 3 (three) times daily. 02/26/13  Yes Debbrah Alar, NP  fentaNYL (DURAGESIC - DOSED MCG/HR) 75 MCG/HR Place 1 patch (75 mcg total) onto the skin every 3 (three) days. 09/03/13  Yes Orson Eva, MD  furosemide (LASIX) 40 MG tablet Take 1 tablet (40 mg total) by mouth daily. 09/03/13  Yes Orson Eva, MD  HYDROmorphone (DILAUDID) 2 MG tablet Take 1 tablet (2 mg total) by mouth every 4 (four) hours as needed for severe pain. 08/24/13  Yes Volanda Napoleon, MD  lansoprazole (PREVACID) 30 MG capsule Take 30 mg by mouth daily at 12 noon.   Yes Historical Provider, MD  lidocaine-prilocaine (EMLA) cream Apply 1 application topically as needed (before chemo).   Yes Historical Provider, MD  LORazepam (ATIVAN) 0.5 MG tablet Take 1 tablet (0.5  mg total) by mouth every 6 (six) hours as needed (Nausea or vomiting). 08/23/13  Yes Volanda Napoleon, MD  metoprolol tartrate (LOPRESSOR) 25 MG tablet Take 1 tablet (25 mg total) by mouth 2 (two) times daily. 09/03/13  Yes Orson Eva, MD  ondansetron (ZOFRAN) 8 MG tablet Take 1 tablet (8 mg total) by mouth 2 (two) times daily. Take two times a day starting the day after chemo for 2 days. Then take two times a day as needed for nausea or vomiting. 08/23/13  Yes Volanda Napoleon, MD  predniSONE (DELTASONE) 20 MG tablet Take 10 mg by mouth daily with breakfast. Take 1 pill a day with food. 07/25/13  Yes Volanda Napoleon, MD  prochlorperazine (COMPAZINE) 10 MG tablet Take 1 tablet (10 mg total) by mouth every 6 (six) hours as needed (Nausea or vomiting). 08/23/13  Yes Volanda Napoleon, MD  Pyridoxine HCl (VITAMIN B-6) 250 MG tablet Take 250 mg by mouth every morning.  06/21/13  Yes Volanda Napoleon, MD  acyclovir (ZOVIRAX) 400 MG tablet Take 1 tablet (400 mg total) by mouth 2 (two) times daily. 09/17/13   Volanda Napoleon, MD  dexamethasone (DECADRON) 4 MG tablet Take 2 tablets (8 mg total) by mouth 2 (two) times daily with a meal. Start the day after chemotherapy for 3 days. 09/17/13   Volanda Napoleon, MD  LORazepam (ATIVAN) 0.5 MG tablet Take 1 tablet (0.5 mg total) by mouth every 6 (six) hours as needed (Nausea or vomiting). 09/17/13   Volanda Napoleon, MD  ondansetron (ZOFRAN) 8 MG tablet Take 1 tablet (8 mg total) by mouth 2 (two) times daily. Start the day after chemo for 3 days. Then take as needed for nausea or vomiting. 09/17/13   Volanda Napoleon, MD  prochlorperazine (COMPAZINE) 10 MG tablet Take 1 tablet (10 mg total) by mouth every 6 (six) hours as needed (Nausea or vomiting). 09/17/13   Volanda Napoleon, MD   Allergies  Allergen Reactions  . Penicillins Nausea And Vomiting, Swelling and Rash    FAMILY HISTORY:  Family History  Problem Relation Age of Onset  . Cancer Maternal Grandfather   . Cancer Maternal  Grandmother    SOCIAL HISTORY:  reports that he quit smoking about 35 years ago. His smoking use included Cigarettes. He started smoking about 56 years ago. He has a 30 pack-year smoking history. He has never used smokeless tobacco. He reports that he does not drink alcohol or use illicit drugs.  REVIEW OF SYSTEMS:  Unable to complete as patient is altered.   SUBJECTIVE:   VITAL SIGNS: Temp:  [97.1 F (36.2 C)-98 F (36.7 C)] 97.7 F (36.5 C) (02/11 0621) Pulse Rate:  [78-90] 80 (02/11 0621) Resp:  [20] 20 (02/11 0621) BP: (128-141)/(60-77) 129/67 mmHg (02/11 0621) SpO2:  [96 %-100 %] 96 % (02/11 0621)  PHYSICAL  EXAMINATION: General:  Chronically ill adult male Neuro:  Altered post ativan, moans intermittently  HEENT:  Mm pink/dry, no jvd Cardiovascular:  s1s2 rrr, no m/r/g, distant tones Lungs:  Tachypnea, mild abdominal accessory muscle usage, lungs bilaterally diminished with faint crackles lateral posterior Abdomen:  Obese,soft, L colostomy Musculoskeletal:  No acute deformities Skin:  Pale, warm/dry, trace LE edema   Recent Labs Lab 09/17/13 0535 09/18/13 0530 09/19/13 0500  NA 135* 136* 136*  K 4.1 4.4 4.5  CL 101 104 104  CO2 $Re'27 26 25  'QEC$ BUN 34* 38* 37*  CREATININE 0.88 0.82 0.79  GLUCOSE 187* 179* 229*    Recent Labs Lab 09/17/13 0535 09/18/13 0530 09/19/13 0500  HGB 7.3* 6.6* 7.8*  HCT 21.8* 19.4* 23.3*  WBC 29.5* 27.8* 20.9*  PLT 11* 24* 13*   Dg Chest Port 1 View  09/19/2013   CLINICAL DATA:  Pulmonary infiltrates. History of relapsed multiple myeloma with leukemic transformation.  EXAM: PORTABLE CHEST - 1 VIEW  COMPARISON:  DG CHEST 1V PORT dated 09/18/2013; DG CHEST 1V PORT dated 09/14/2013  FINDINGS: Diffuse bilateral airspace disease has worsened since the prior study which may be a combination of infection and pulmonary edema. A small right pleural effusion is suspected. The heart size and mediastinal contours are within normal limits. There is stable  appearance of a porta cath as well as a left-sided PICC line.  IMPRESSION: Worsening of diffuse bilateral pulmonary airspace disease.   Electronically Signed   By: Aletta Edouard M.D.   On: 09/19/2013 08:10   Dg Chest Port 1 View  09/18/2013   CLINICAL DATA:  Shortness of breath increased seen  EXAM: PORTABLE CHEST - 1 VIEW  COMPARISON:  09/14/2013 chest radiograph and chest CT 08/26/2013  FINDINGS: Right IJ Port-A-Cath terminates at the junction of the superior vena cava and the right atrium. Left upper extremity PICC terminates in the distal superior vena cava. The heart and mediastinal contours are stable. Lung volumes are low bilaterally. There are patchy bilateral airspace opacities most prominent in the upper lung fields bilaterally and at the left lung base. Aeration of both lungs has decreased since chest radiograph of 09/14/2013. There is focal pleural thickening or pleural fluid laterally at the right lung base that is stable. There is slight blunting of both costophrenic angles, for which tiny pleural effusions are not excluded.  There are vertebroplasty changes in the lower thoracic and upper lumbar spine. No acute osseous abnormality identified.  IMPRESSION: 1. Patchy bilateral airspace disease is suspicious for multifocal infection. Pulmonary edema is felt to be less likely.  2. Small area of pleural thickening at the right lateral lung base appears similar to prior chest CT of January 2015 where there is focal pleural thickening or fluid.  3. Suspect small bilateral pleural effusions.   Electronically Signed   By: Curlene Dolphin M.D.   On: 09/18/2013 11:10    ASSESSMENT / PLAN:  A: Bilateral Infiltrates - concern for COP vs pneumonitis  Acute Respiratory Failure O2 Dependence  DDx is DAH, BOOP, viral alveolitis, fungal infection or bacterial pneumonia P: - Transfer to ICU. - Intubate and mechanically ventilate. - F/U ABG and CXR. - Bronch post intubation and sent for bacterial, fungal  and viral cultures. - BD's  - Abx as above - Continue decadron for ?BOOP. - Assess LDH, ?PCP. - Discontinue lasix.  A: Relapsed Multiple Myeloma Spinal Mass Anemia  Thrombocytopenia  P: - Chemo per Oncology. - Decadron $RemoveBe'4mg'[id number]$  Q6. -  Empiric acyclovir. - Bleeding precautions.  A: Acute Encephalopathy  Chronic Pain  Insomnia  P: - Fentanyl patch 100 mcg - Decadron as above - Sedation protocol as ordered.  A: Hyperglycemia  P: - SSI with steroids  A: GERD Constipation   P:  - Protonix  - Bowel regiment  Noe Gens, NP-C [facility] Pgr: [phone] or [phone]   09/19/2013, 1:55 PM  Goals of Care Discussed with mother & girlfriend at bedside current clinical situation and concerns for respiratory failure with bilateral infiltrates and altered mental status.  They indicate that they feel they have done everything medically possible to attempt to heal the patient and have no regrets.  Family is amendable to mechanical ventilation if needed but only short term and to allow for chemo / rads effect to be determined.  They do not feel that CPR/cardioversion would be in the patient's best interest but will make LCB.  Post intubation will bronch and send BAL.  CC time 45 min.  Patient seen and examined, agree with above note.  I dictated the care and orders written for this patient under my direction.  Rush Farmer, MD [phone]

## 2013-09-19 NOTE — Progress Notes (Signed)
Pt intubated by MD with RT at bedside with no complications. ETT placement confirmed with bronch. Placed on vent with no complications

## 2013-09-19 NOTE — Plan of Care (Signed)
Problem: Phase I Progression Outcomes Goal: VTE prophylaxis Outcome: Completed/Met Date Met:  09/19/13 Bilateral Lower Legs SCDs     

## 2013-09-19 NOTE — Progress Notes (Signed)
Pharmacy: Brief Chemo Note  Per Dr. Marin Olp,  Do not give chemo tonight 09/19/13.    BorgerdingGaye Alken PharmD Pager #: [phone] 5:12 PM 09/19/2013

## 2013-09-19 NOTE — Procedures (Signed)
Intubation Procedure Note DICKEY CAAMANO [id number] 02-Dec-1940  Procedure: Intubation Indications: Respiratory insufficiency  Procedure Details Consent: Risks of procedure as well as the alternatives and risks of each were explained to the (patient/caregiver).  Consent for procedure obtained. Time Out: Verified patient identification, verified procedure, site/side was marked, verified correct patient position, special equipment/implants available, medications/allergies/relevent history reviewed, required imaging and test results available.  Performed  Maximum sterile technique was used including antiseptics, gloves, hand hygiene and mask.  MAC    Evaluation Hemodynamic Status: BP stable throughout; O2 sats: stable throughout Patient's Current Condition: stable Complications: No apparent complications Patient did tolerate procedure well. Chest X-ray ordered to verify placement.  CXR: pending.   [name] 09/19/2013

## 2013-09-19 NOTE — Procedures (Signed)
Bedside Bronchoscopy Procedure Note Robert Berger [id number] 01/08/1941  Procedure: Bronchoscopy Indications: airway bleeding  Procedure Details: ET Tube Size:8.0 ET Tube secured at lip (cm):21 Bite block in place: Yes In preparation for procedure, Patient hyper-oxygenated with 100 % FiO2 and Saline given via ETT (40 ml) Airway entered and the following bronchi were examined: RUL, RML, RLL, LUL, LLL and Bronchi.   Bronchoscope removed.  , Patient placed back on 100% FiO2 at conclusion of procedure.    Evaluation BP 105/75  Pulse 107  Temp(Src) 97.3 F (36.3 C) (Oral)  Resp 20  Ht 5\' 9"  (1.753 m)  Wt 193 lb 8 oz (87.771 kg)  BMI 27.76 kg/m2  SpO2 95% Breath Sounds:Diminished and Rhonch O2 sats: stable throughout Patient's Current Condition: stable Specimens:  Sent Complications: No apparent complications Patient did tolerate procedure well.   [name] 09/19/2013, 4:52 PM

## 2013-09-19 NOTE — Plan of Care (Signed)
Problem: Consults Goal: Diabetes Guidelines if Diabetic/Glucose > 140 If diabetic or lab glucose is > 140 mg/dl - Initiate Diabetes/Hyperglycemia Guidelines & Document Interventions  Outcome: Completed/Met Date Met:  09/19/13 Pt on Q4 CBG checks, with SSI

## 2013-09-19 NOTE — Progress Notes (Signed)
Left radial aline placed by Dr Nelda Marseille. Consent-emergent. Labs reviewed by MD. Lowella Dell dressed and hung to 1046ml NS. Aline good blood flow. Aline zero and good waveform

## 2013-09-19 NOTE — Progress Notes (Signed)
Mr. Oscar La, unfortunately, is now in the ICU and intubated. He developed fairly progressive pulmonary infiltrates. 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 He has thrombocytopenia. We transfusing 2 days ago. He had very little response. I did start him on Amicar.  He, unfortunately, is just going to decline. His prealbumin is only 4. I do not see how he will recover from this. I just don't think that he has the reserves to be able to recover.  I do not see that chemotherapy will have enough time to help. I thought that we could give it a try in hopes that we might be able to reverse the myeloma progression. Unfortunately I think that he has declared himself.  I very much appreciate all the outstanding care that he is getting in the ICU and that he received on [place].  I talked to him this morning. I told him that I just do not think that he was going to be able to handle any further stress to his body. He understood this. He still wanted to keep trying.  I spoke to his significant other and his mom. His girlfriend has been with him for over 20 years. I talked to them about his current status. I told them that I do not see that he was ever going to take any more treatment. He does not be any more radiation. I think with further chemotherapy, his blood counts will worsen and then he would have more issues to go up against.  I talked to his girlfriend and mom about end of life issues. I told them that if he were to develop cardiac issues or blood pressure issues while on the ventilator that we were not going to improve his outcome by trying to treat these issues. We were just to prolong his existence on life support. They agreed with me in new that he would not want to exist at a machine. They agreed that no further measures be taken to keep him alive if he were to have a cardiac event.  He has a daughter who is going to come in tomorrow. She is from  out of town. We will maintain our  current level of care for now.  If we can get him off life support, then we would be looking at comfort care and quality of life. We would then get hospice involved.  I told his girlfriend and mother that the next 2 or 3 days would be critical and that he either will stabilize or his chest x-ray will worsen and he would require more oxygen support. They understand this.  Again, I appreciate critical care managing his ventilator issues and pressure issues for right now.  Pete E.

## 2013-09-19 NOTE — Progress Notes (Signed)
Inpatient Diabetes Program Recommendations  AACE/ADA: New Consensus Statement on Inpatient Glycemic Control (2013)  Target Ranges:  Prepandial:   less than 140 mg/dL      Peak postprandial:   less than 180 mg/dL (1-2 hours)      Critically ill patients:  140 - 180 mg/dL   Hyperglycemia Diabetes history: Type 2 Outpatient Diabetes medications: none noted Current orders for Inpatient glycemic control: none noted  Inpatient Diabetes Program Recommendations Correction (SSI): Please consider  adding some correction insulin while here.  Sensitive tidwc.  Thank you, Rosita Kea, RN, CNS, Diabetes Coordinator [phone])

## 2013-09-19 NOTE — Procedures (Signed)
Arterial Catheter Insertion Procedure Note Robert Berger [id number] 01-14-1941  Procedure: Insertion of Arterial Catheter  Indications: Blood pressure monitoring  Procedure Details Consent: Unable to obtain consent because of emergent medical necessity. Time Out: Verified patient identification, verified procedure, site/side was marked, verified correct patient position, special equipment/implants available, medications/allergies/relevent history reviewed, required imaging and test results available.  Performed  Maximum sterile technique was used including antiseptics, cap, gloves, gown, hand hygiene, mask and sheet. Skin prep: Chlorhexidine; local anesthetic administered 20 gauge catheter was inserted into left radial artery using the Seldinger technique.  Evaluation Blood flow good; BP tracing good. Complications: No apparent complications.   [name] 09/19/2013

## 2013-09-19 NOTE — Progress Notes (Addendum)
PATIENT'S PLATELETS 13 THIS AM, DOWN FROM 24 YESTERDAY, I WILL LET DR. Marin Olp KNOW THE  RESULTS, Joslynn Jamroz, RN

## 2013-09-19 NOTE — Procedures (Signed)
Bronchoscopy Procedure Note Robert Berger [id number] 03-21-1941  Procedure: Bronchoscopy Indications: Obtain specimens for culture and/or other diagnostic studies  Procedure Details Consent: Risks of procedure as well as the alternatives and risks of each were explained to the (patient/caregiver).  Consent for procedure obtained. Time Out: Verified patient identification, verified procedure, site/side was marked, verified correct patient position, special equipment/implants available, medications/allergies/relevent history reviewed, required imaging and test results available.  Performed  In preparation for procedure, patient was given 100% FiO2 and bronchoscope lubricated. Sedation: Benzodiazepines, Muscle relaxants and Etomidate  Airway entered and the following bronchi were examined: RUL, RML, RLL, LUL, LLL and Bronchi.   Bronchoscope removed.    Evaluation Hemodynamic Status: BP stable throughout; O2 sats: stable throughout Patient's Current Condition: stable. Specimens:  Sent purulent fluid sent for cultures. Complications: No apparent complications. Patient did  tolerate procedure well.   Robert Berger 09/19/2013

## 2013-09-20 ENCOUNTER — Inpatient Hospital Stay (HOSPITAL_COMMUNITY): Payer: Medicare Other

## 2013-09-20 ENCOUNTER — Ambulatory Visit: Payer: BC Managed Care – PPO

## 2013-09-20 DIAGNOSIS — R579 Shock, unspecified: Secondary | ICD-10-CM

## 2013-09-20 LAB — COMPREHENSIVE METABOLIC PANEL
ALBUMIN: 1.8 g/dL — AB (ref 3.5–5.2)
ALT: 43 U/L (ref 0–53)
AST: 65 U/L — ABNORMAL HIGH (ref 0–37)
Alkaline Phosphatase: 187 U/L — ABNORMAL HIGH (ref 39–117)
BUN: 63 mg/dL — ABNORMAL HIGH (ref 6–23)
CALCIUM: 5.9 mg/dL — AB (ref 8.4–10.5)
CO2: 17 meq/L — AB (ref 19–32)
Chloride: 107 mEq/L (ref 96–112)
Creatinine, Ser: 1.49 mg/dL — ABNORMAL HIGH (ref 0.50–1.35)
GFR calc Af Amer: 52 mL/min — ABNORMAL LOW (ref >90)
GFR, EST NON AFRICAN AMERICAN: 45 mL/min — AB (ref >90)
Glucose, Bld: 226 mg/dL — ABNORMAL HIGH (ref 70–99)
Potassium: 6 mEq/L — ABNORMAL HIGH (ref 3.7–5.3)
SODIUM: 137 meq/L (ref 137–147)
Total Bilirubin: 0.7 mg/dL (ref 0.3–1.2)
Total Protein: 7 g/dL (ref 6.0–8.3)

## 2013-09-20 LAB — RESPIRATORY VIRUS PANEL
ADENOVIRUS: NOT DETECTED
INFLUENZA A: NOT DETECTED
INFLUENZA B 1: NOT DETECTED
Influenza A H1: NOT DETECTED
Influenza A H3: NOT DETECTED
Metapneumovirus: NOT DETECTED
PARAINFLUENZA 1 A: NOT DETECTED
PARAINFLUENZA 3 A: NOT DETECTED
Parainfluenza 2: NOT DETECTED
RESPIRATORY SYNCYTIAL VIRUS B: NOT DETECTED
Respiratory Syncytial Virus A: NOT DETECTED
Rhinovirus: NOT DETECTED

## 2013-09-20 LAB — GLUCOSE, CAPILLARY
GLUCOSE-CAPILLARY: 215 mg/dL — AB (ref 70–99)
GLUCOSE-CAPILLARY: 260 mg/dL — AB (ref 70–99)
GLUCOSE-CAPILLARY: 191 mg/dL — AB (ref 70–99)
Glucose-Capillary: 216 mg/dL — ABNORMAL HIGH (ref 70–99)
Glucose-Capillary: 252 mg/dL — ABNORMAL HIGH (ref 70–99)
Glucose-Capillary: 268 mg/dL — ABNORMAL HIGH (ref 70–99)

## 2013-09-20 LAB — URIC ACID, RANDOM URINE: Uric Acid, Urine: 15.7 mg/dL

## 2013-09-20 LAB — CBC
HCT: 16.2 % — ABNORMAL LOW (ref 39.0–52.0)
Hemoglobin: 5.5 g/dL — CL (ref 13.0–17.0)
MCH: 29.3 pg (ref 26.0–34.0)
MCHC: 34 g/dL (ref 30.0–36.0)
MCV: 86.2 fL (ref 78.0–100.0)
Platelets: 13 10*3/uL — CL (ref 150–400)
RBC: 1.88 MIL/uL — AB (ref 4.22–5.81)
RDW: 17.4 % — ABNORMAL HIGH (ref 11.5–15.5)
WBC: 42.5 10*3/uL — ABNORMAL HIGH (ref 4.0–10.5)

## 2013-09-20 LAB — BLOOD GAS, ARTERIAL
ACID-BASE DEFICIT: 9.3 mmol/L — AB (ref 0.0–2.0)
Bicarbonate: 15.8 mEq/L — ABNORMAL LOW (ref 20.0–24.0)
DRAWN BY: 317871
FIO2: 0.4 %
O2 SAT: 96.5 %
PEEP: 5 cmH2O
Patient temperature: 98.2
RATE: 18 resp/min
TCO2: 15.8 mmol/L (ref 0–100)
VT: 560 mL
pCO2 arterial: 32.1 mmHg — ABNORMAL LOW (ref 35.0–45.0)
pH, Arterial: 7.311 — ABNORMAL LOW (ref 7.350–7.450)
pO2, Arterial: 91.2 mmHg (ref 80.0–100.0)

## 2013-09-20 LAB — BASIC METABOLIC PANEL
BUN: 74 mg/dL — ABNORMAL HIGH (ref 6–23)
CHLORIDE: 106 meq/L (ref 96–112)
CO2: 14 mEq/L — ABNORMAL LOW (ref 19–32)
Calcium: 5.9 mg/dL — CL (ref 8.4–10.5)
Creatinine, Ser: 1.96 mg/dL — ABNORMAL HIGH (ref 0.50–1.35)
GFR, EST AFRICAN AMERICAN: 38 mL/min — AB (ref >90)
GFR, EST NON AFRICAN AMERICAN: 32 mL/min — AB (ref >90)
Glucose, Bld: 258 mg/dL — ABNORMAL HIGH (ref 70–99)
POTASSIUM: 5.8 meq/L — AB (ref 3.7–5.3)
SODIUM: 137 meq/L (ref 137–147)

## 2013-09-20 LAB — PNEUMOCYSTIS JIROVECI SMEAR BY DFA: Pneumocystis jiroveci Ag: NEGATIVE

## 2013-09-20 LAB — TROPONIN I: Troponin I: <0.3 ng/mL (ref <0.30)

## 2013-09-20 LAB — LACTIC ACID, PLASMA: Lactic Acid, Venous: 3.6 mmol/L — ABNORMAL HIGH (ref 0.5–2.2)

## 2013-09-20 LAB — MAGNESIUM: Magnesium: 3 mg/dL — ABNORMAL HIGH (ref 1.5–2.5)

## 2013-09-20 LAB — PHOSPHORUS: Phosphorus: 8.8 mg/dL — ABNORMAL HIGH (ref 2.3–4.6)

## 2013-09-20 LAB — URIC ACID: Uric Acid, Serum: 16.1 mg/dL — ABNORMAL HIGH (ref 4.0–7.8)

## 2013-09-20 LAB — PROCALCITONIN: Procalcitonin: 1.9 ng/mL

## 2013-09-20 LAB — PREPARE RBC (CROSSMATCH)

## 2013-09-20 MED ORDER — NOREPINEPHRINE BITARTRATE 1 MG/ML IJ SOLN
2.0000 ug/min | INTRAVENOUS | Status: DC
  Administered 2013-09-20: 10 ug/min via INTRAVENOUS
  Filled 2013-09-20: qty 8

## 2013-09-20 MED ORDER — SODIUM CHLORIDE 0.9 % IV BOLUS (SEPSIS)
500.0000 mL | INTRAVENOUS | Status: AC
  Administered 2013-09-20: 500 mL via INTRAVENOUS

## 2013-09-20 MED ORDER — PHENYLEPHRINE HCL 10 MG/ML IJ SOLN
30.0000 ug/min | INTRAVENOUS | Status: DC
  Administered 2013-09-20: 75 ug/min via INTRAVENOUS
  Administered 2013-09-20: 100 ug/min via INTRAVENOUS
  Filled 2013-09-20 (×3): qty 1

## 2013-09-20 MED ORDER — DEXTROSE 50 % IV SOLN
INTRAVENOUS | Status: AC
  Filled 2013-09-20: qty 50

## 2013-09-20 MED ORDER — SODIUM POLYSTYRENE SULFONATE 15 GM/60ML PO SUSP
60.0000 g | Freq: Once | ORAL | Status: AC
  Administered 2013-09-20: 60 g
  Filled 2013-09-20: qty 240

## 2013-09-20 MED ORDER — SODIUM CHLORIDE 0.9 % IV SOLN
1.0000 g | Freq: Once | INTRAVENOUS | Status: AC
  Administered 2013-09-20: 1 g via INTRAVENOUS
  Filled 2013-09-20: qty 10

## 2013-09-20 MED ORDER — VITAL HIGH PROTEIN PO LIQD
1000.0000 mL | ORAL | Status: DC
  Filled 2013-09-20: qty 1000

## 2013-09-20 MED ORDER — INSULIN ASPART 100 UNIT/ML IV SOLN
5.0000 [IU] | Freq: Once | INTRAVENOUS | Status: AC
  Administered 2013-09-20: 5 [IU] via INTRAVENOUS
  Filled 2013-09-20: qty 0.05

## 2013-09-20 MED ORDER — PRO-STAT SUGAR FREE PO LIQD
30.0000 mL | Freq: Two times a day (BID) | ORAL | Status: DC
  Administered 2013-09-20 (×2): 30 mL
  Filled 2013-09-20 (×4): qty 30

## 2013-09-20 MED ORDER — VITAL 1.5 CAL PO LIQD
1000.0000 mL | ORAL | Status: DC
  Administered 2013-09-20: 1000 mL
  Filled 2013-09-20 (×2): qty 1000

## 2013-09-20 MED ORDER — DEXTROSE 50 % IV SOLN
1.0000 | Freq: Once | INTRAVENOUS | Status: AC
  Administered 2013-09-20: 50 mL via INTRAVENOUS

## 2013-09-20 NOTE — Progress Notes (Signed)
NUTRITION FOLLOW UP/CONSULT FOR TF MANAGEMENT  Intervention:   - Initiate TF via OGT of Vital 1.5 start at 64ml/hr and increase by 40ml every 4 hours to goal of 17ml/hr with Prostat 41ml BID. Goal rate will provide 2000 calories, 111g protein, and 981ml free water and meet 103% estimated calorie needs and 100% estimated protein needs. If IVF d/c, recommend 15ml water flushes 6 times/day.  - Continue adult enteral protocol - Will continue to monitor   Nutrition Dx:   Increased nutrient needs (protein/kcal) related to increased demand for nutrients as evidenced by chronic disease-multiple myeloma, undergoing radiation - ongoing   Goal:   Pt to meet >/= 90% of their estimated nutrition needs - not met yet but will meet once TF at goal rate   Monitor:   Weights, labs, TF tolerance/advancement, vent status  Assessment:   [name] is a 73 year old white gentleman. 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 He was doing very well with respect to maintenance therapy for myeloma. In November, however, he had rapid recurrence. He had a large lumbosacral mass. He had disease all throughout the spine. Bone marrow was done which showed relapsed myeloma. He had a very poor cytogenetics with the relapse.   2/6  -Pt reported decreased appetite and weight fluctuations related to chemo/radiation treatments and frequent hospitalizations  -Prior to myeloma dx, pt's usual weight was 270 lbs. Decreased to 200 lbs over a period of several months.  -Pt's lowest weight was 182 lbs, but was able to regain weight back and has maintained 200 lbs until recent hospitalization in 07/2013.  -Had been d/c to SNF for temporary rehab at end of 07/2013, PO intake significantly declined d/t limited food options and its decreased food palatability.  -Was started on Marinol on 2/02, but d/c on 2/06 d/t medication possibly causing "floaters" per MD note  -Pt reported an improvement in appetite past several days, is able to  eat 75% or more of meals. Noted the food is slightly bland, but it is able to use seasonings to increase its appeal. Denied any nausea  -Enjoys [employer] supplement over [employer]. Will modify to BID between meals as pt did not have preference for when to receive supplement, preferred to have available as needed on unit.  -RN also noted pt asking for Boost/Ensure occasionally. Will add once daily to assist in meeting protein needs  -Will monitor intake of [place] and increase Ensure as needed  -Has hx of non insulin dependent DM. Will continue with liberalized diet to encourage PO intake. Will modify as needed  2/12 - Chemotherapy started 2/9.  - C/o inability to breathe 2/10, was noted to have decreased mental status with worsening of bilateral infiltrates 2/11 and was transferred to ICU and intubated. Chemotherapy not given 2/11. Noted pt's intake before intubation was around 50% of meals.  - Plan is to start TF today. Pt alone in room, has mittens on both hands r/t pt almost pulling out ETT tube last night. Pt's weight up 7 pounds since admission, noted pt with generalized edema.   Patient is currently intubated on ventilator support.  MV: 10.8 L/min Temp (24hrs), Avg:98.5 F (36.9 C), Min:96.6 F (35.9 C), Max:99.9 F (37.7 C)  Propofol: not ordered   Height: Ht Readings from Last 1 Encounters:  09/19/13 $RemoveB'5\' 9"'GdwCcOCI$  (1.753 m)    Weight Status:   Wt Readings from Last 1 Encounters:  09/09/13 193 lb 8 oz (87.771 kg)  Admit wt:  186 lb 6.4 oz (84.5 kg)    Re-estimated needs:  Kcal: 1946 Protein: 105-120g Fluid: > 1.9L/day  Skin: Generalized edema  Diet Order:  NPO   Intake/Output Summary (Last 24 hours) at 09/20/13 1056 Last data filed at 09/20/13 0900  Gross per 24 hour  Intake 3787.5 ml  Output    320 ml  Net 3467.5 ml    Last BM: 2/11   Labs:   Recent Labs Lab 09/18/13 0530 09/19/13 0500 09/20/13 0412  NA 136* 136* 137  K 4.4 4.5 6.0*   CL 104 104 107  CO2 26 25 17*  BUN 38* 37* 63*  CREATININE 0.82 0.79 1.49*  CALCIUM 7.3* 6.8* 5.9*  MG  --   --  3.0*  PHOS  --   --  8.8*  GLUCOSE 179* 229* 226*    CBG (last 3)   Recent Labs  09/20/13 0014 09/20/13 0421 09/20/13 0732  GLUCAP 216* 215* 260*    Scheduled Meds: . acyclovir  400 mg Oral BID  . aminocaproic acid (AMICAR) IVPB  1 g/hr Intravenous 3 times per day  . antiseptic oral rinse  15 mL Mouth Rinse QID  . bortezomib IV  1.3 mg/m2 (Treatment Plan Actual) Intravenous Q72H  . chlorhexidine  15 mL Mouth Rinse BID  . CISplatin with cyclophosphamide and etoposide chemo infusion   Intravenous Q24H  . dexamethasone  4 mg Intravenous 4 times per day  . D5-1/2NS + KCL + MG +/- MANNITOL IV CISplatin hydration   Intravenous Q24H  . DOXOrubicin (ADRIAMYCIN) CHEMO for Inpatient continous infusion  8 mg/m2 (Treatment Plan Actual) Intravenous Q24H  . feeding supplement (VITAL HIGH PROTEIN)  1,000 mL Per Tube Q24H  . fentaNYL  100 mcg Transdermal Q72H  . imipenem-cilastatin  500 mg Intravenous Q6H  . insulin aspart  0-15 Units Subcutaneous 6 times per day  . ipratropium-albuterol  3 mL Nebulization Q6H  . palonosetron  0.25 mg Intravenous Q48H  . pantoprazole (PROTONIX) IV  40 mg Intravenous Q12H    Continuous Infusions: . sodium chloride 20 mL (09/19/13 2021)  . sodium chloride 60 mL (09/20/13 0051)  . fentaNYL infusion INTRAVENOUS 200 mcg/hr (09/20/13 0857)  . midazolam (VERSED) infusion 1 mg/hr (09/20/13 0857)  . norepinephrine (LEVOPHED) Adult infusion 10 mcg/min (09/20/13 0958)    Mikey College MS, RD, [place] Pager [phone] After Hours Pager

## 2013-09-20 NOTE — Progress Notes (Signed)
Chaplain responded to consult for spiritual support for pt's family.  Family not present at time of chaplain arrival.  Consulted with RN and NP.  Will follow up in order to make contact with pt's family and assess for support needs.   Frederic, [place]

## 2013-09-20 NOTE — Progress Notes (Signed)
CSW was following for potential d/c needs when pt was on [facility]. CSW reviewed chart and noted that pt has had medical decline and currently on ventilator and transferred to ICU.  CSW signing off at this time.  Please re-consult social work as social work needs arise.   Alison Murray, MSW, [place] Work [phone]

## 2013-09-20 NOTE — Progress Notes (Signed)
At about 0215 when pt was being repositioned Pt pulled on his ETT tube - resulting in Air leak, before this incident Pt was on 230mcg of Fentanly and seemed comfortable on the vent. E-Link MD was notified, order for a stat chest xray was received and completed. Xray report  adviced that ETT be advanced 2cm this was done by Respiratory Therapist.  Pt oxygen saturation was in the high 90s thru this incident. Pt currently on 481mcg of Fentany and 2mg  of Versed.

## 2013-09-20 NOTE — Therapy (Signed)
Pt transported to CT and back with ventilator

## 2013-09-20 NOTE — Consult Note (Signed)
Name: Robert Berger MRN: [id number] DOB: 04-12-1941    ADMISSION DATE:  08/31/2013 CONSULTATION DATE:  09/19/13  REFERRING MD :  Dr. Marin Olp PRIMARY SERVICE:  Oncology  CHIEF COMPLAINT:  Abnormal CT Chest  BRIEF PATIENT DESCRIPTION:   73 y/o M with PMH of GERD, PE/DVT, Severe low back pain, Ruptured Diverticulum s/p colostomy, NIDDM and relapsed IgG kappa myeloma who was admitted on 1/30 from the [place] office with complaints of severe low back pain.  He was recently admitted from 1/17-1/26 with CAP. CT scan on 1/18 demonstrated bilateral patchy airspace disease concerning for infectious etiology. He was previously doing well with maintenence therapy for MM.  However, he had relapse with rapid recurrence with lumbosacral mass and disease throughout the spine.  He was started on salvage chemotherapy with Kyprolis/Cytoxan/Decadron.    2/11 patient was noted to have decreased mental status and cxr with worsening of bilateral infiltrates. He was given ativan after an episode of anxiety.  PCCM consulted for bilateral pulmonary infiltrates.    DEVICES ETT  >>09/19/13 Bronch 09/19/13>> A line 09/19/13>>    CULTURES: Results for orders placed during the hospital encounter of 08/14/2013  CULTURE, BAL-QUANTITATIVE     Status: None   Collection Time    09/19/13  5:15 PM      Result Value Ref Range Status   Specimen Description BRONCHIAL ALVEOLAR LAVAGE   Final   Special Requests Immunocompromised   Final   Gram Stain     Final   Value: RARE WBC PRESENT, PREDOMINANTLY PMN     NO SQUAMOUS EPITHELIAL CELLS SEEN     NO ORGANISMS SEEN     Performed at [facility] PENDING   Incomplete   Culture PENDING   Incomplete   Report Status PENDING   Incomplete  PNEUMOCYSTIS JIROVECI SMEAR BY DFA     Status: None   Collection Time    09/19/13  5:15 PM      Result Value Ref Range Status   Specimen Source-[facility] BRONCHIAL ALVEOLAR LAVAGE   Final   Pneumocystis jiroveci Ag NEGATIVE    Final   Comment: Performed at [facility]: Primaxin 2/10>>> Diflucan 2/8>>> Acyclovir 2/9>>>    SIGNIFICANT EVENTS / STUDIES:  1/17-1/26 - Admit with CAP ................................................................................................ 1/30 - Admit from [facility] office for back pain management in setting of relapsed MM on salvage chemo  09/19/13 - Intubated. Subsequentt goals of care with Dr Marin Olp with family: full medical care + DNAR + short term vent only   SUBJECTIVE:   2/12: 40% fio2, Levophed 10, Not waking up in partial wua, developing renal failure (low calcium), got transfused  VITAL SIGNS: Temp:  [96.6 F (35.9 C)-99.9 F (37.7 C)] 99 F (37.2 C) (02/12 0734) Pulse Rate:  [79-115] 98 (02/12 0800) Resp:  [8-32] 18 (02/12 0800) BP: (80-120)/(53-76) 111/64 mmHg (02/12 0800) SpO2:  [94 %-100 %] 98 % (02/12 0800) FiO2 (%):  [40 %-100 %] 40 % (02/12 0800)  PHYSICAL EXAMINATION: General:  Chronically ill adult male Neuro:  Altered post ativan, moans intermittently  HEENT:  Mm pink/dry, no jvd Cardiovascular:  s1s2 rrr, no m/r/g, distant tones Lungs:  Tachypnea, mild abdominal accessory muscle usage, lungs bilaterally diminished with faint crackles lateral posterior Abdomen:  Obese,soft, L colostomy Musculoskeletal:  No acute deformities Skin:  Pale, warm/dry, trace LE edema   PULMONARY  Recent Labs Lab 09/19/13 1510 09/19/13 1830 09/20/13 0426  PHART 7.342*  7.336* 7.311*  PCO2ART 39.5 35.5 32.1*  PO2ART 71.5* 267.0* 91.2  HCO3 20.9 18.6* 15.8*  TCO2 20.3 18.2 15.8  O2SAT 93.3 99.4 96.5    CBC  Recent Labs Lab 09/18/13 0530 09/19/13 0500 09/20/13 0412  HGB 6.6* 7.8* 5.5*  HCT 19.4* 23.3* 16.2*  WBC 27.8* 20.9* 42.5*  PLT 24* 13* 13*    COAGULATION No results found for this basename: INR,  in the last 168 hours  CARDIAC  No results found for this basename: TROPONINI,  in the last 168 hours No results  found for this basename: PROBNP,  in the last 168 hours   CHEMISTRY  Recent Labs Lab 09/16/13 0452 09/17/13 0535 09/18/13 0530 09/19/13 0500 09/20/13 0412  NA 135* 135* 136* 136* 137  K 4.1 4.1 4.4 4.5 6.0*  CL 101 101 104 104 107  CO2 _0 17*  GLUCOSE 213* 187* 179* 229* 226*  BUN 28* 34* 38* 37* 63*  CREATININE 0.92 0.88 0.82 0.79 1.49*  CALCIUM 7.6* 7.4* 7.3* 6.8* 5.9*  MG  --   --   --   --  3.0*  PHOS  --   --   --   --  8.8*   Estimated Creatinine Clearance: 49.1 ml/min (by C-G formula based on Cr of 1.49).   LIVER  Recent Labs Lab 09/16/13 0452 09/17/13 0535 09/18/13 0530 09/19/13 0500 09/20/13 0412  AST 46* 41* 42* 44* 65*  ALT 52 56* 56* 55* 43  ALKPHOS 235* 265* 267* 240* 187*  BILITOT 1.5* 1.1 0.9 0.7 0.7  PROT 8.2 8.6* 8.4* 7.8 7.0  ALBUMIN 2.0* 2.0* 2.0* 2.0* 1.8*     INFECTIOUS No results found for this basename: LATICACIDVEN, PROCALCITON,  in the last 168 hours   ENDOCRINE CBG (last 3)   Recent Labs  09/20/13 0014 09/20/13 0421 09/20/13 0732  GLUCAP 216* 215* 260*         IMAGING x48h  Dg Chest Port 1 View  09/20/2013   CLINICAL DATA:  Repositioning of endotracheal tube.  EXAM: PORTABLE CHEST - 1 VIEW  COMPARISON:  DG CHEST 1V PORT dated 09/20/2013  FINDINGS: The endotracheal tube is been advanced substantially. It is now 12 mm from the carina and can be retracted 1 cm. The lungs appear unchanged. Other support apparatus is unchanged.  IMPRESSION: Endotracheal tube advancement, now 12 mm from the carina. Retraction of 1 cm should place the tube in good position.   Electronically Signed   By: Dereck Ligas M.D.   On: 09/20/2013 03:51   Dg Chest Port 1 View  09/20/2013   CLINICAL DATA:  Evaluate endotracheal tube.  EXAM: PORTABLE CHEST - 1 VIEW  COMPARISON:  DG CHEST 1V PORT dated 09/19/2013  FINDINGS: The endotracheal tube tip is 57 mm from the carina. This could be advanced 2 cm for better positioning. Enteric tube is  present with the tip in the stomach. Low volume chest with bilateral airspace disease that appears minimally improved compared to the recent prior examination.Right IJ power port unchanged.  IMPRESSION: Endotracheal tube tip 57 mm from the carina, the tube could be advanced 2 cm for more secure positioning. Enteric tube in the stomach. Low volume chest with minimal improvement in bilateral airspace disease, likely representing improving pulmonary edema.   Electronically Signed   By: Dereck Ligas M.D.   On: 09/20/2013 03:03   Dg Chest Port 1 View  09/19/2013   CLINICAL DATA:  Post intubation, evaluate OG  tube placement  EXAM: PORTABLE CHEST - 1 VIEW  COMPARISON:  Prior chest x-ray 09/19/2013 at 5:36 a.m.  FINDINGS: The patient has been intubated. The tip of the ET tube is 1.7 cm above the carinal. A nasogastric tube is placed. The proximal side port projects over the body of the stomach in good position. There is a right IJ approach single-lumen port catheter. Catheter tip at the superior cavoatrial junction. Left upper extremity PICC also with catheter tip at the superior cavoatrial junction.  No pneumothorax. Lower inspiratory volumes with progression of a bilateral interstitial and airspace opacities.  IMPRESSION: 1. Interval intubation. Tip of the ET tube is 1.7 cm above the carina. 2. Placement of an OG tube. The proximal side port is in good position overlying the gastric bubble. 3. Progression of diffuse bilateral interstitial and airspace opacities concerning for acute pulmonary edema versus multifocal pneumonia.   Electronically Signed   By: Jacqulynn Cadet M.D.   On: 09/19/2013 17:38   Dg Chest Port 1 View  09/19/2013   CLINICAL DATA:  Pulmonary infiltrates. History of relapsed multiple myeloma with leukemic transformation.  EXAM: PORTABLE CHEST - 1 VIEW  COMPARISON:  DG CHEST 1V PORT dated 09/18/2013; DG CHEST 1V PORT dated 09/14/2013  FINDINGS: Diffuse bilateral airspace disease has worsened since  the prior study which may be a combination of infection and pulmonary edema. A small right pleural effusion is suspected. The heart size and mediastinal contours are within normal limits. There is stable appearance of a porta cath as well as a left-sided PICC line.  IMPRESSION: Worsening of diffuse bilateral pulmonary airspace disease.   Electronically Signed   By: Aletta Edouard M.D.   On: 09/19/2013 08:10   Dg Chest Port 1 View  09/18/2013   CLINICAL DATA:  Shortness of breath increased seen  EXAM: PORTABLE CHEST - 1 VIEW  COMPARISON:  09/14/2013 chest radiograph and chest CT 08/26/2013  FINDINGS: Right IJ Port-A-Cath terminates at the junction of the superior vena cava and the right atrium. Left upper extremity PICC terminates in the distal superior vena cava. The heart and mediastinal contours are stable. Lung volumes are low bilaterally. There are patchy bilateral airspace opacities most prominent in the upper lung fields bilaterally and at the left lung base. Aeration of both lungs has decreased since chest radiograph of 09/14/2013. There is focal pleural thickening or pleural fluid laterally at the right lung base that is stable. There is slight blunting of both costophrenic angles, for which tiny pleural effusions are not excluded.  There are vertebroplasty changes in the lower thoracic and upper lumbar spine. No acute osseous abnormality identified.  IMPRESSION: 1. Patchy bilateral airspace disease is suspicious for multifocal infection. Pulmonary edema is felt to be less likely.  2. Small area of pleural thickening at the right lateral lung base appears similar to prior chest CT of January 2015 where there is focal pleural thickening or fluid.  3. Suspect small bilateral pleural effusions.   Electronically Signed   By: Curlene Dolphin M.D.   On: 09/18/2013 11:10    ASSESSMENT / PLAN:  PULMONARY A: Bilateral Infiltrates - concern for COP vs pneumonitis  Acute Respiratory Failure O2 Dependence    DDx is DAH, BOOP, viral alveolitis, fungal infection or bacterial pneumonia  09/20/13: VDRF, 40% fio2 P:   - Full vent support  CARDIOVASCULAR A: Nil acute P:  Check bnp Check trop  RENAL A:  deveopliong acute renal failure P:   Renal US Check  serumn and urine uric acid  GASTROINTESTINAL A:  gerd P:   ppi Start tube feeds  HEMATOLOGIC A:  HEME Relapsed Multiple Myeloma Spinal Mass Anemia  Thrombocytopenia P:  - PRBC only forg hgb < 7gm% (s/p 2 unit prbc 21/2/15, not c.w bleed( - Platelet on 09/20/13 - Chemo per Oncology. - Decadron 109m Q6. - Empiric acyclovir. - Bleeding precautions.   INFECTIOUS A:  Possible septic shock P:   Abc as above checkpct and lactate  ENDOCRINE A:  dm   P:   ssi  NEUROLOGIC Acute Encephalopathy  Chronic Pain  Insomnia  09/20/13: Possibly unresponsive  P:  - CT head rule out bleed - Fentanyl patch 100 mcg - Decadron as above - Sedation protocol as ordered.      Goals of Care 09/19/13: Discussed with mother & girlfriend at bedside current clinical situation and concerns for respiratory failure with bilateral infiltrates and altered mental status.  They indicate that they feel they have done everything medically possible to attempt to heal the patient and have no regrets.  Family is amendable to mechanical ventilation if needed but only short term and to allow for chemo / rads effect to be determined.  They do not feel that CPR/cardioversion would be in the patient's best interest but will make LCB.  Post intubation will bronch and send BAL.  09/20/13: No family at [facility]   The patient is critically ill with multiple organ systems failure and requires high complexity decision making for assessment and support, frequent evaluation and titration of therapies, application of advanced monitoring technologies and extensive interpretation of multiple databases.   Critical Care Time devoted to patient care services described  in this note is  35  Minutes.  Dr. MBrand Males M.D., FSurgcenter Of Greater Phoenix LLCC.P Pulmonary and Critical Care Medicine Staff Physician [facility]ada[facility]ulmonary and Critical Care Pager: [phone] If no answer or between  15:00h - 7:00h: call [phone]  09/20/2013 10:47 AM

## 2013-09-20 NOTE — Progress Notes (Signed)
Physical Therapy Discharge Patient Details Name: Robert Berger MRN: [id number] DOB: [birth year] Today's Date: 09/20/2013 Time:  -     Patient discharged from PT services secondary to medical decline - will need to re-order PT to resume therapy services. Pt is on ventilator.  Please see latest therapy progress note for current level of functioning and progress toward goals.      GP     Marcelino Freestone PT [phone]  09/20/2013, 7:26 AM

## 2013-09-20 NOTE — Progress Notes (Signed)
  Pt getting tube feeds/supplemental nutrition with glucose steadily in 200's. May want to increase correction to resistant correction scale q 4hrs  while on Decadron.  Thank you, Rosita Kea, RN, CNS, Diabetes Coordinator [phone])

## 2013-09-20 NOTE — Progress Notes (Signed)
eLink Physician-Brief Progress Note Patient Name: Robert Berger DOB: 11/17/1940 MRN: [id number]  Date of Service  09/20/2013   HPI/Events of Note   CVT  eICU Interventions   Levophed d/c'd Neo-synephrine ordered    Intervention Category Major Interventions: Arrhythmia - evaluation and management  Amyra Vantuyl 09/20/2013, 3:18 PM

## 2013-09-20 NOTE — Progress Notes (Signed)
eLink Physician-Brief Progress Note Patient Name: Robert Berger DOB: 1941-04-25 MRN: [id number]  Date of Service  09/20/2013   HPI/Events of Note  Hyperkalemia with K of 6.0 and hypocalcemia   eICU Interventions  Plan: Insulin/Dextrose Calcium gluconate Kayexalate Repeat labs this afternoon   Intervention Category Intermediate Interventions: Electrolyte abnormality - evaluation and management  DETERDING,ELIZABETH 09/20/2013, 6:01 AM

## 2013-09-20 NOTE — Progress Notes (Signed)
CRITICAL VALUE ALERT  Critical value received:  Ca 5.9  Date of notification:  09/20/13  Time of notification:  0555  Critical value read back:yes  Nurse who received alert:  Jari Favre RN  MD notified (1st page):  Dr Johnette Abraham.  Deterding   Time of first page:  0559  MD notified (2nd page):  Time of second page:  Responding MD:  Dr. Jimmy Footman  Time MD responded: [phone]

## 2013-09-20 NOTE — Progress Notes (Signed)
eLink Physician-Brief Progress Note Patient Name: Robert Berger DOB: August 10, 1940 MRN: [id number]  Date of Service  09/20/2013   HPI/Events of Note  Hgb of 5.5 down from 7.8   eICU Interventions  Plan: Transfuse 2 units of pRBC Post-transfusion CBC   Intervention Category Intermediate Interventions: Bleeding - evaluation and treatment with blood products  DETERDING,ELIZABETH 09/20/2013, 5:29 AM

## 2013-09-20 NOTE — Progress Notes (Signed)
Pt was noted to have heart rate of 165-170. Dr. Chase Caller on floor and consulted about pts status. MD said to get an EKG and show to [facility] NP to evaluate. Pt was in SVT. Order from [place] NP to give 1L NS bolus. Bolus given. Heart rate decreased into the 130s

## 2013-09-20 NOTE — Progress Notes (Signed)
[place]  [phone]) Central NOTE  Events since 2/11  Noted.  He is on ventilator. His Hb dropped from 7.8 to 5.5 requiring transfusion-patient bleeding-He is on ventilator. CT of the head is pending.Days 1 through 5, Cycle 1 had been started, but now, plans may have to be placed on  hold due to all the current events including septic shock, and bleeding. Dr. Marin Olp has discussed with the family the complications of chemo (see his note). Family to discuss today plan. His status has declined. Not responding to sound.   MEDICATIONS:  Scheduled Meds: . acyclovir  400 mg Oral BID  . aminocaproic acid (AMICAR) IVPB  1 g/hr Intravenous 3 times per day  . antiseptic oral rinse  15 mL Mouth Rinse QID  . bortezomib IV  1.3 mg/m2 (Treatment Plan Actual) Intravenous Q72H  . chlorhexidine  15 mL Mouth Rinse BID  . CISplatin with cyclophosphamide and etoposide chemo infusion   Intravenous Q24H  . dexamethasone  4 mg Intravenous 4 times per day  . D5-1/2NS + KCL + MG +/- MANNITOL IV CISplatin hydration   Intravenous Q24H  . DOXOrubicin (ADRIAMYCIN) CHEMO for Inpatient continous infusion  8 mg/m2 (Treatment Plan Actual) Intravenous Q24H  . feeding supplement (RESOURCE BREEZE)  1 Container Oral BID BM  . fentaNYL  100 mcg Transdermal Q72H  . imipenem-cilastatin  500 mg Intravenous Q6H  . insulin aspart  0-15 Units Subcutaneous 6 times per day  . ipratropium-albuterol  3 mL Nebulization Q6H  . palonosetron  0.25 mg Intravenous Q48H  . pantoprazole (PROTONIX) IV  40 mg Intravenous Q12H  . sodium polystyrene  60 g Per Tube Once   Continuous Infusions: . sodium chloride 20 mL (09/19/13 2021)  . sodium chloride 60 mL (09/20/13 0051)  . fentaNYL infusion INTRAVENOUS 400 mcg/hr (09/20/13 0600)  . midazolam (VERSED) infusion 2 mg/hr (09/20/13 0600)  . norepinephrine (LEVOPHED) Adult infusion 20 mcg/min (09/20/13 0851)   PRN Meds:.heparin lock flush,  heparin lock flush, lip balm, sodium chloride, sodium chloride, sodium chloride, sodium chloride  ALLERGIES:   Allergies  Allergen Reactions  . Penicillins Nausea And Vomiting, Swelling and Rash     PHYSICAL EXAMINATION:   Filed Vitals:   09/20/13 0734  BP:   Pulse:   Temp: 99 F (37.2 C)  Resp:    Filed Weights   08/18/2013 1744 09/09/13 0523  Weight: 186 lb 6.4 oz (84.55 kg) 193 lb 8 oz (87.8 kg)    73 year old white male. ill appearing male. Sedated. On ventilator.Does not respond to name.  Lungs decreased breath sounds at the bases anteriorilly, trace rales. No wheezing. Mild accessory muscle use. Cardiac: regular rate and rhythm,no murmur , rubs or gallops Abdomen  Obese, soft nontender, left colostomy. Extremities no clubbing cyanosis, trace  Edema. Neuro: patient not responding to sound.   LABORATORY/RADIOLOGY DATA:   Recent Labs Lab 09/16/13 0452 09/17/13 0535 09/18/13 0530 09/19/13 0500 09/20/13 0412  WBC 31.0* 29.5* 27.8* 20.9* 42.5*  HGB 8.2* 7.3* 6.6* 7.8* 5.5*  HCT 23.7* 21.8* 19.4* 23.3* 16.2*  PLT 16* 11* 24* 13* 13*  MCV 87.5 87.6 87.8 86.9 86.2  MCH 30.3 29.3 29.9 29.1 29.3  MCHC 34.6 33.5 34.0 33.5 34.0  RDW 18.3* 18.8* 19.2* 17.1* 17.4*  LYMPHSABS 20.2*  --   --   --   --   MONOABS 1.2*  --   --   --   --   EOSABS  0.0  --   --   --   --   BASOSABS 0.0  --   --   --   --     CMP    Recent Labs Lab 09/16/13 0452 09/17/13 0535 09/18/13 0530 09/19/13 0500 2013/10/06 0412  NA 135* 135* 136* 136* 137  K 4.1 4.1 4.4 4.5 6.0*  CL 101 101 104 104 107  CO2 27 27 26 25  17*  GLUCOSE 213* 187* 179* 229* 226*  BUN 28* 34* 38* 37* 63*  CREATININE 0.92 0.88 0.82 0.79 1.49*  CALCIUM 7.6* 7.4* 7.3* 6.8* 5.9*  MG  --   --   --   --  3.0*  AST 46* 41* 42* 44* 65*  ALT 52 56* 56* 55* 43  ALKPHOS 235* 265* 267* 240* 187*  BILITOT 1.5* 1.1 0.9 0.7 0.7        Component Value Date/Time   BILITOT 0.7 Oct 06, 2013 0412   BILITOT 0.63 08/23/2013  1229   BILITOT 0.70 08/13/2013 1317   BILIDIR 0.9* 03/18/2013 2339   IBILI 0.5 03/18/2013 2339        CBG:  Recent Labs Lab 09/19/13 0800 09/19/13 2019 10-06-13 0014 10-06-13 0421 2013/10/06 0732  GLUCAP 197* 242* 216* 215* 260*    Radiology Studies:   Dg Chest Port 1 View  06-Oct-2013   CLINICAL DATA:  Repositioning of endotracheal tube.  EXAM: PORTABLE CHEST - 1 VIEW  COMPARISON:  DG CHEST 1V PORT dated 10-06-2013  FINDINGS: The endotracheal tube is been advanced substantially. It is now 12 mm from the carina and can be retracted 1 cm. The lungs appear unchanged. Other support apparatus is unchanged.  IMPRESSION: Endotracheal tube advancement, now 12 mm from the carina. Retraction of 1 cm should place the tube in good position.   Electronically Signed   By: Dereck Ligas M.D.   On: 06-Oct-2013 03:51   Dg Chest Port 1 View  Oct 06, 2013   CLINICAL DATA:  Evaluate endotracheal tube.  EXAM: PORTABLE CHEST - 1 VIEW  COMPARISON:  DG CHEST 1V PORT dated 09/19/2013  FINDINGS: The endotracheal tube tip is 57 mm from the carina. This could be advanced 2 cm for better positioning. Enteric tube is present with the tip in the stomach. Low volume chest with bilateral airspace disease that appears minimally improved compared to the recent prior examination.Right IJ power port unchanged.  IMPRESSION: Endotracheal tube tip 57 mm from the carina, the tube could be advanced 2 cm for more secure positioning. Enteric tube in the stomach. Low volume chest with minimal improvement in bilateral airspace disease, likely representing improving pulmonary edema.   Electronically Signed   By: Dereck Ligas M.D.   On: October 06, 2013 03:03      ASSESSMENT AND PLAN:  1. IgG kappa myeloma admitted on 1/30 for T10 cord compression with extensive epidural involvement of the thoracic spine and lumbar spine in setting of relapsed MM on salvage chemo with Kyprolis/Cytoxan/Decadron.Days 1 through 5, Cycle 1 was started on  09/17/13,  As per discussion among Dr. Marin Olp, significant other and mother, plans for chemo may have to be placed on  hold due to overall clinical decline including decreased mental status, worsening bilateral infiltrates in the setting of vent support. Patient will not be able to tolerate chemo without worsening of his counts. Daughter to discuss with MD. If agreed,Hospice Consultation to be obtained. 2. Severe anemia in the setting of bleeding, Hb 5.5, transfusing 2 units of PRBCs as per  admitting team. 2. Recent hospitalization for CAP 1/17-1/26 2. History of Perforated diverticulum, LLQ colostomy 3.  History of Pulmonary embolism/deep venous thrombosis 4. Appreciate Nutrition follow up.  Other medical issues as per admitting team.  Rondel Jumbo, PA-C 09/20/2013, 8:38 AM  ADDENDUM:  He is still on the ventilator. I appreciate all the help from critical care. His chest x-ray looks about the same. He is still on pressors support. He is on sedation. He is on fentanyl pain control.  I still think that we are in a "comfort mode" setting and hopefully we can get him extubated so that he can spend his time with his family.  It does not look like he is bleeding from his lungs.  I don't see that we have to transfuse him right now.  I will be in touch with his family as he continued to move ahead.

## 2013-09-20 NOTE — Progress Notes (Signed)
CRITICAL VALUE ALERT  Critical value received:  Hgb 5.5  Date of notification:  09/20/13  Time of notification:  0520  Critical value read back:yes  Nurse who received alert:  Jari Favre RN  MD notified (1st page): Dr.  Patricia Nettle.  Time of first page:  0226  MD notified (2nd page):  Time of second page:  Responding MD:  Dr Jimmy Footman  Time MD responded:  0226, new order received

## 2013-09-21 ENCOUNTER — Inpatient Hospital Stay (HOSPITAL_COMMUNITY): Payer: Medicare Other

## 2013-09-21 ENCOUNTER — Ambulatory Visit: Payer: BC Managed Care – PPO

## 2013-09-21 DIAGNOSIS — J9600 Acute respiratory failure, unspecified whether with hypoxia or hypercapnia: Secondary | ICD-10-CM

## 2013-09-21 DIAGNOSIS — Z515 Encounter for palliative care: Secondary | ICD-10-CM

## 2013-09-21 LAB — CBC WITH DIFFERENTIAL/PLATELET
BAND NEUTROPHILS: 0 % (ref 0–10)
BASOS ABS: 0 10*3/uL (ref 0.0–0.1)
BASOS PCT: 0 % (ref 0–1)
Blasts: 0 %
Eosinophils Absolute: 0 10*3/uL (ref 0.0–0.7)
Eosinophils Relative: 0 % (ref 0–5)
HEMATOCRIT: 18.2 % — AB (ref 39.0–52.0)
HEMOGLOBIN: 6.4 g/dL — AB (ref 13.0–17.0)
LYMPHS PCT: 37 % (ref 12–46)
Lymphs Abs: 2.7 10*3/uL (ref 0.7–4.0)
MCH: 29.6 pg (ref 26.0–34.0)
MCHC: 35.2 g/dL (ref 30.0–36.0)
MCV: 84.3 fL (ref 78.0–100.0)
METAMYELOCYTES PCT: 0 %
MONO ABS: 0.8 10*3/uL (ref 0.1–1.0)
Monocytes Relative: 11 % (ref 3–12)
Myelocytes: 0 %
Neutro Abs: 3.7 10*3/uL (ref 1.7–7.7)
Neutrophils Relative %: 52 % (ref 43–77)
PROMYELOCYTES ABS: 0 %
Platelets: 20 10*3/uL — CL (ref 150–400)
RBC: 2.16 MIL/uL — ABNORMAL LOW (ref 4.22–5.81)
RDW: 16 % — ABNORMAL HIGH (ref 11.5–15.5)
WBC: 7.2 10*3/uL (ref 4.0–10.5)
nRBC: 0 /100 WBC

## 2013-09-21 LAB — PREPARE PLATELET PHERESIS: Unit division: 0

## 2013-09-21 LAB — BLOOD GAS, ARTERIAL
ACID-BASE DEFICIT: 13.8 mmol/L — AB (ref 0.0–2.0)
Bicarbonate: 12.5 mEq/L — ABNORMAL LOW (ref 20.0–24.0)
DRAWN BY: 317871
FIO2: 0.4 %
LHR: 18 {breaths}/min
O2 SAT: 97.8 %
PATIENT TEMPERATURE: 98.6
PEEP: 5 cmH2O
PO2 ART: 115 mmHg — AB (ref 80.0–100.0)
TCO2: 12.6 mmol/L (ref 0–100)
VT: 560 mL
pCO2 arterial: 32 mmHg — ABNORMAL LOW (ref 35.0–45.0)
pH, Arterial: 7.214 — ABNORMAL LOW (ref 7.350–7.450)

## 2013-09-21 LAB — TYPE AND SCREEN
ABO/RH(D): A POS
Antibody Screen: NEGATIVE
UNIT DIVISION: 0
Unit division: 0

## 2013-09-21 LAB — COMPREHENSIVE METABOLIC PANEL
ALBUMIN: 1.8 g/dL — AB (ref 3.5–5.2)
ALT: 25 U/L (ref 0–53)
AST: 64 U/L — AB (ref 0–37)
Alkaline Phosphatase: 200 U/L — ABNORMAL HIGH (ref 39–117)
BUN: 89 mg/dL — AB (ref 6–23)
CO2: 12 mEq/L — ABNORMAL LOW (ref 19–32)
CREATININE: 2.47 mg/dL — AB (ref 0.50–1.35)
Calcium: 5.7 mg/dL — CL (ref 8.4–10.5)
Chloride: 107 mEq/L (ref 96–112)
GFR calc Af Amer: 28 mL/min — ABNORMAL LOW (ref >90)
GFR calc non Af Amer: 24 mL/min — ABNORMAL LOW (ref >90)
Glucose, Bld: 267 mg/dL — ABNORMAL HIGH (ref 70–99)
Potassium: 4.8 mEq/L (ref 3.7–5.3)
Sodium: 138 mEq/L (ref 137–147)
TOTAL PROTEIN: 6.6 g/dL (ref 6.0–8.3)
Total Bilirubin: 0.6 mg/dL (ref 0.3–1.2)

## 2013-09-21 LAB — MAGNESIUM: Magnesium: 2.6 mg/dL — ABNORMAL HIGH (ref 1.5–2.5)

## 2013-09-21 LAB — PROCALCITONIN: Procalcitonin: 1.19 ng/mL

## 2013-09-21 LAB — GLUCOSE, CAPILLARY
GLUCOSE-CAPILLARY: 286 mg/dL — AB (ref 70–99)
GLUCOSE-CAPILLARY: 263 mg/dL — AB (ref 70–99)
GLUCOSE-CAPILLARY: 246 mg/dL — AB (ref 70–99)

## 2013-09-21 LAB — LACTIC ACID, PLASMA: LACTIC ACID, VENOUS: 2 mmol/L (ref 0.5–2.2)

## 2013-09-21 LAB — PRO B NATRIURETIC PEPTIDE: Pro B Natriuretic peptide (BNP): 558.7 pg/mL — ABNORMAL HIGH (ref 0–125)

## 2013-09-21 LAB — TROPONIN I

## 2013-09-21 LAB — PHOSPHORUS: Phosphorus: 9.6 mg/dL — ABNORMAL HIGH (ref 2.3–4.6)

## 2013-09-21 MED ORDER — METOPROLOL TARTRATE 1 MG/ML IV SOLN
5.0000 mg | Freq: Once | INTRAVENOUS | Status: AC
  Administered 2013-09-21: 5 mg via INTRAVENOUS
  Filled 2013-09-21: qty 5

## 2013-09-21 MED ORDER — SODIUM CHLORIDE 0.9 % IV SOLN
1.0000 g | Freq: Once | INTRAVENOUS | Status: DC
  Filled 2013-09-21: qty 10

## 2013-09-21 MED ORDER — LORAZEPAM 2 MG/ML IJ SOLN
1.0000 mg/h | INTRAVENOUS | Status: DC
  Administered 2013-09-21: 5 mg/h via INTRAVENOUS
  Administered 2013-09-21 (×2): 8 mg/h via INTRAVENOUS
  Filled 2013-09-21 (×3): qty 25

## 2013-09-21 MED ORDER — SODIUM BICARBONATE 650 MG PO TABS
650.0000 mg | ORAL_TABLET | Freq: Three times a day (TID) | ORAL | Status: DC
  Administered 2013-09-21: 650 mg
  Filled 2013-09-21 (×4): qty 1

## 2013-09-21 NOTE — Progress Notes (Signed)
Extensive discussion with family regarding patients current clinical condition.  Family (girlfriend, mother, step-daughter) at bedside and are in agreement to provide comfort focused care with withdrawal of ventilator support.     Plan: -continue fentanyl gtt for pain -initiate ativan gtt, once pt comfortable, will extubate to [place] -no new interventions -up titrate medications for normal respiratory rate -unrestricted family visitation -withdrawal of care orders put in place   Noe Gens, NP-C [place] Pgr: [phone] or [phone]

## 2013-09-21 NOTE — Progress Notes (Signed)
20cc of Versed, concentration 1mg /71ml in a 38ml bag, wasted in sink with Nelva Bush, RN

## 2013-09-21 NOTE — Progress Notes (Addendum)
Name: Robert Berger MRN: [id number] DOB: 04-14-1941    ADMISSION DATE:  08/17/2013 CONSULTATION DATE:  09/19/13  REFERRING MD :  Dr. Marin Olp PRIMARY SERVICE:  Oncology  CHIEF COMPLAINT:  Abnormal CT Chest  BRIEF PATIENT DESCRIPTION:   73 y/o M with PMH of GERD, PE/DVT, Severe low back pain, Ruptured Diverticulum s/p colostomy, NIDDM and relapsed IgG kappa myeloma who was admitted on 1/30 from the [place] office with complaints of severe low back pain.  He was recently admitted from 1/17-1/26 with CAP. CT scan on 1/18 demonstrated bilateral patchy airspace disease concerning for infectious etiology. He was previously doing well with maintenence therapy for MM.  However, he had relapse with rapid recurrence with lumbosacral mass and disease throughout the spine.  He was started on salvage chemotherapy with Kyprolis/Cytoxan/Decadron.    2/11 patient was noted to have decreased mental status and cxr with worsening of bilateral infiltrates. He was given ativan after an episode of anxiety.  PCCM consulted for bilateral pulmonary infiltrates.    DEVICES ETT  >>09/19/13 Bronch 09/19/13>> A line 09/19/13>>2/13    CULTURES: Results for orders placed during the hospital encounter of 09/01/2013  AFB CULTURE WITH SMEAR     Status: None   Collection Time    09/19/13  5:15 PM      Result Value Ref Range Status   Specimen Description BRONCHIAL ALVEOLAR LAVAGE   Final   Special Requests Immunocompromised   Final   ACID FAST SMEAR     Final   Value: NO ACID FAST BACILLI SEEN     Performed at [facility]   Culture     Final   Value: CULTURE WILL BE EXAMINED FOR 6 WEEKS BEFORE ISSUING A FINAL REPORT     Performed at [facility]   Report Status PENDING   Incomplete  CULTURE, BAL-QUANTITATIVE     Status: None   Collection Time    09/19/13  5:15 PM      Result Value Ref Range Status   Specimen Description BRONCHIAL ALVEOLAR LAVAGE   Final   Special Requests Immunocompromised    Final   Gram Stain     Final   Value: RARE WBC PRESENT, PREDOMINANTLY PMN     NO SQUAMOUS EPITHELIAL CELLS SEEN     NO ORGANISMS SEEN     Performed at [facility]     Final   Value: 500 COLONIES/ML     Performed at [facility]   Culture     Final   Value: CANDIDA ALBICANS     Performed at [facility]   Report Status PENDING   Incomplete  FUNGUS CULTURE W SMEAR     Status: None   Collection Time    09/19/13  5:15 PM      Result Value Ref Range Status   Specimen Description BRONCHIAL WASHINGS   Final   Special Requests Immunocompromised   Final   Fungal Smear     Final   Value: NO YEAST OR FUNGAL ELEMENTS SEEN     Performed at [facility]   Culture     Final   Value: CULTURE IN PROGRESS FOR FOUR WEEKS     Performed at [facility]   Report Status PENDING   Incomplete  LEGIONELLA CULTURE     Status: None   Collection Time    09/19/13  5:15 PM      Result Value Ref Range  Status   Specimen Description BRONCHIAL ALVEOLAR LAVAGE   Final   Special Requests Immunocompromised   Final   Culture     Final   Value: NO LEGIONELLA ISOLATED, CULTURE IN PROGRESS FOR 5 DAYS     Performed at [facility]   Report Status PENDING   Incomplete  PNEUMOCYSTIS JIROVECI SMEAR BY DFA     Status: None   Collection Time    09/19/13  5:15 PM      Result Value Ref Range Status   Specimen Source-[facility] BRONCHIAL ALVEOLAR LAVAGE   Final   Pneumocystis jiroveci Ag NEGATIVE   Final   Comment: Performed at  [facility]  RESPIRATORY VIRUS PANEL     Status: None   Collection Time    09/19/13  5:53 PM      Result Value Ref Range Status   Source - [facility]AN BRONCHIAL ALVEOLAR LAVAGE   Corrected   Comment: CORRECTED ON 02/12 AT 1824: PREVIOUSLY REPORTED AS BRONCHIAL ALVEOLAR LAVAGE   Respiratory Syncytial Virus A NOT DETECTED   Final   Respiratory Syncytial Virus B NOT DETECTED   Final   Influenza A NOT DETECTED   Final   Influenza B NOT  DETECTED   Final   Parainfluenza 1 NOT DETECTED   Final   Parainfluenza 2 NOT DETECTED   Final   Parainfluenza 3 NOT DETECTED   Final   Metapneumovirus NOT DETECTED   Final   Rhinovirus NOT DETECTED   Final   Adenovirus NOT DETECTED   Final   Influenza A H1 NOT DETECTED   Final   Influenza A H3 NOT DETECTED   Final   Comment: (NOTE)           Normal Reference Range for each Analyte: NOT DETECTED     Testing performed using the Luminex xTAG Respiratory Viral Panel test     kit.     This test was developed and its performance characteristics determined     by [facility]. It has not been cleared or approved by the [place]     Food and Drug Administration. This test is used for clinical purposes.     It should not be regarded as investigational or for research. This     laboratory is certified under the Clinical Laboratory Improvement     Amendments of 1988 (CLIA) as qualified to perform high complexity     clinical laboratory testing.     Performed at [facility]      ANTIBIOTICS: Primaxin 2/10>>> Diflucan 2/8>>> Acyclovir 2/9>>>    SIGNIFICANT EVENTS / STUDIES:  1/17-1/26 - Admit with CAP ................................................................................................ 1/30 - Admit from ONC office for back pain management in setting of relapsed MM on salvage chemo  09/19/13 - Intubated. Subsequentt goals of care with Dr Ennever with family: full medical care + DNAR + short term vent only     2/12: 40% fio2, Levophed 10, Not waking up in partial wua, developing renal failure (low calcium), got transfused    SUBJECTIVE/OVERNIGHT/INTERVAL HX 09/21/13: Off pressors. Got various blood products. Dr Enenrver thinks patient is terminal. Family updated per Dr Ennever. Need terminal wean discussion  VITAL SIGNS: Temp:  [97.4 F (36.3 C)-98.8 F (37.1 C)] 98.6 F (37 C) (02/13 0400) Pulse Rate:  [110-165] 111 (02/13 0800) Resp:  [11-24] 24 (02/13  0800) BP: (100-139)/(57-99) 126/70 mmHg (02/13 0800) SpO2:  [95 %-100 %] 97 % (02/13 0800) FiO2 (%):  [40 %] 40 % (02/13 0847)   Weight:  [101.2 kg (223 lb 1.7 oz)] 101.2 kg (223 lb 1.7 oz) (02/13 0453)  PHYSICAL EXAMINATION: General:  Chronically ill adult male Neuro:  Altered post ativan, moans intermittently  HEENT:  Mm pink/dry, no jvd Cardiovascular:  s1s2 rrr, no m/r/g, distant tones Lungs:  Tachypnea, mild abdominal accessory muscle usage, lungs bilaterally diminished with faint crackles lateral posterior Abdomen:  Obese,soft, L colostomy Musculoskeletal:  No acute deformities Skin:  Pale, warm/dry, trace LE edema   PULMONARY  Recent Labs Lab 09/19/13 1510 09/19/13 1830 09/20/13 0426 09/21/13 0445  PHART 7.342* 7.336* 7.311* 7.214*  PCO2ART 39.5 35.5 32.1* 32.0*  PO2ART 71.5* 267.0* 91.2 115.0*  HCO3 20.9 18.6* 15.8* 12.5*  TCO2 20.3 18.2 15.8 12.6  O2SAT 93.3 99.4 96.5 97.8    CBC  Recent Labs Lab 09/19/13 0500 09/20/13 0412 09/21/13 0514  HGB 7.8* 5.5* 6.4*  HCT 23.3* 16.2* 18.2*  WBC 20.9* 42.5* 7.2  PLT 13* 13* 20*    COAGULATION No results found for this basename: INR,  in the last 168 hours  CARDIAC    Recent Labs Lab 09/20/13 1120 09/20/13 1800 09/20/13 2242 09/21/13 0514  TROPONINI <0.30 <0.30 <0.30 <0.30    Recent Labs Lab 09/21/13 0514  PROBNP 558.7*     CHEMISTRY  Recent Labs Lab 09/18/13 0530 09/19/13 0500 09/20/13 0412 09/20/13 1500 09/21/13 0514  NA 136* 136* 137 137 138  K 4.4 4.5 6.0* 5.8* 4.8  CL 104 104 107 106 107  CO2 26 25 17* 14* 12*  GLUCOSE 179* 229* 226* 258* 267*  BUN 38* 37* 63* 74* 89*  CREATININE 0.82 0.79 1.49* 1.96* 2.47*  CALCIUM 7.3* 6.8* 5.9* 5.9* 5.7*  MG  --   --  3.0*  --  2.6*  PHOS  --   --  8.8*  --  9.6*   Estimated Creatinine Clearance: 31.7 ml/min (by C-G formula based on Cr of 2.47).   LIVER  Recent Labs Lab 09/17/13 0535 09/18/13 0530 09/19/13 0500 09/20/13 0412  09/21/13 0514  AST 41* 42* 44* 65* 64*  ALT 56* 56* 55* 43 25  ALKPHOS 265* 267* 240* 187* 200*  BILITOT 1.1 0.9 0.7 0.7 0.6  PROT 8.6* 8.4* 7.8 7.0 6.6  ALBUMIN 2.0* 2.0* 2.0* 1.8* 1.8*     INFECTIOUS  Recent Labs Lab 09/20/13 1120 09/21/13 0514  LATICACIDVEN 3.6* 2.0  PROCALCITON 1.90 1.19     ENDOCRINE CBG (last 3)   Recent Labs  09/20/13 2341 09/21/13 0354 09/21/13 0715  GLUCAP 252* 263* 246*         IMAGING x48h  Ct Head Wo Contrast  09/20/2013   CLINICAL DATA:  Loss consciousness  EXAM: CT HEAD WITHOUT CONTRAST  TECHNIQUE: Contiguous axial images were obtained from the base of the skull through the vertex without intravenous contrast.  COMPARISON:  None.  FINDINGS: Mild diffuse global atrophy. No acute intracranial abnormality. Specifically, no hemorrhage, hydrocephalus, mass lesion, acute infarction, or significant intracranial injury. No acute calvarial abnormality. The visualized paranasal sinuses and mastoid air cells are patent  IMPRESSION: Mild involutional changes. No acute intracranial abnormality.   Electronically Signed   By: Margaree Mackintosh M.D.   On: 09/20/2013 16:33   US Renal  09/20/2013   CLINICAL DATA:  Hypertension and obesity. Renal failure. Evaluate for obstruction.  EXAM: RENAL/URINARY TRACT ULTRASOUND COMPLETE  COMPARISON:  CT of the abdomen and pelvis on 03/19/2013.  FINDINGS: Right Kidney:  Length: 10.0 cm. There is renal parenchymal thinning. Renal  parenchyma is slightly echogenic without focal mass. No hydronephrosis.  Left Kidney:  Length: 11.0 cm in length. Renal parenchyma is slightly echogenic without focal mass. No hydronephrosis.  Bladder:  The bladder is decompressed by a Foley and not visible.  IMPRESSION: 1. Bilateral renal parenchymal thinning and echogenic renal parenchyma. 2. No hydronephrosis.   Electronically Signed   By: Beth  Brown M.D.   On: 09/20/2013 15:19   Dg Chest Port 1 View  09/21/2013   CLINICAL DATA:  Acute  respiratory failure.  EXAM: PORTABLE CHEST - 1 VIEW  COMPARISON:  09/20/2013 and 09/19/2013  FINDINGS: Endotracheal tube is at the origin of the right mainstem bronchus and needs to be retracted slightly. Power port and PICC appear in good position.  There is min improvement in the bilateral pulmonary infiltrates. There is a small loculated area of fluid at the right base laterally.  IMPRESSION: 1. Endotracheal tube is at the level of the right mainstem bronchus and needs to be retracted. 2. Significant improvement in the bilateral pulmonary infiltrates. Critical Value/emergent results were called by telephone at the time of interpretation on 09/21/2013 at 7:20 AM to Lisa, RN, who verbally acknowledged these results.   Electronically Signed   By: Jim  Maxwell M.D.   On: 09/21/2013 07:21   Dg Chest Port 1 View  09/20/2013   CLINICAL DATA:  Repositioning of endotracheal tube.  EXAM: PORTABLE CHEST - 1 VIEW  COMPARISON:  DG CHEST 1V PORT dated 09/20/2013  FINDINGS: The endotracheal tube is been advanced substantially. It is now 12 mm from the carina and can be retracted 1 cm. The lungs appear unchanged. Other support apparatus is unchanged.  IMPRESSION: Endotracheal tube advancement, now 12 mm from the carina. Retraction of 1 cm should place the tube in good position.   Electronically Signed   By: Geoffrey  Lamke M.D.   On: 09/20/2013 03:51   Dg Chest Port 1 View  09/20/2013   CLINICAL DATA:  Evaluate endotracheal tube.  EXAM: PORTABLE CHEST - 1 VIEW  COMPARISON:  DG CHEST 1V PORT dated 09/19/2013  FINDINGS: The endotracheal tube tip is 57 mm from the carina. This could be advanced 2 cm for better positioning. Enteric tube is present with the tip in the stomach. Low volume chest with bilateral airspace disease that appears minimally improved compared to the recent prior examination.Right IJ power port unchanged.  IMPRESSION: Endotracheal tube tip 57 mm from the carina, the tube could be advanced 2 cm for more secure  positioning. Enteric tube in the stomach. Low volume chest with minimal improvement in bilateral airspace disease, likely representing improving pulmonary edema.   Electronically Signed   By: Geoffrey  Lamke M.D.   On: 09/20/2013 03:03   Dg Chest Port 1 View  09/19/2013   CLINICAL DATA:  Post intubation, evaluate OG tube placement  EXAM: PORTABLE CHEST - 1 VIEW  COMPARISON:  Prior chest x-ray 09/19/2013 at 5:36 a.m.  FINDINGS: The patient has been intubated. The tip of the ET tube is 1.7 cm above the carinal. A nasogastric tube is placed. The proximal side port projects over the body of the stomach in good position. There is a right IJ approach single-lumen port catheter. Catheter tip at the superior cavoatrial junction. Left upper extremity PICC also with catheter tip at the superior cavoatrial junction.  No pneumothorax. Lower inspiratory volumes with progression of a bilateral interstitial and airspace opacities.  IMPRESSION: 1. Interval intubation. Tip of the ET tube is 1.7 cm above   the carina. 2. Placement of an OG tube. The proximal side port is in good position overlying the gastric bubble. 3. Progression of diffuse bilateral interstitial and airspace opacities concerning for acute pulmonary edema versus multifocal pneumonia.   Electronically Signed   By: Heath  McCullough M.D.   On: 09/19/2013 17:38    ASSESSMENT / PLAN:  PULMONARY A: Bilateral Infiltrates - concern for COP vs pneumonitis  Acute Respiratory Failure O2 Dependence  DDx is DAH, BOOP, viral alveolitis, fungal infection or bacterial pneumonia  09/20/13: VDRF, 40% fio2 P:   - needs terminal wean;   CARDIOVASCULAR A: Nil acute. Off pressors P:   RENAL A: wosening  acute renal failure P:   Will not surivve  GASTROINTESTINAL A:  gerd P:   ppi Dc tube feeds done by heme  HEMATOLOGIC A:  HEME Relapsed Multiple Myeloma Spinal Mass Anemia  Thrombocytopenia   P:  - will not surivive; no more blood  products  INFECTIOUS A:  Possible septic shock. Off pressors P:   Will not surivde  ENDOCRINE A:  dm   P:   ssi  NEUROLOGIC Acute Encephalopathy  Chronic Pain  Insomnia  09/20/13: Possibly unresponsive. Ct h ead negative  P:  - monitor.      Goals of Care 09/19/13: Discussed with mother & girlfriend at bedside current clinical situation and concerns for respiratory failure with bilateral infiltrates and altered mental status.  They indicate that they feel they have done everything medically possible to attempt to heal the patient and have no regrets.  Family is amendable to mechanical ventilation if needed but only short term and to allow for chemo / rads effect to be determined.  They do not feel that CPR/cardioversion would be in the patient's best interest but will make LCB.  Post intubation will bronch and send BAL.  09/20/13: No family at [place]  09/21/13: family has been called for terminal wean goals of care discussion. Brandi Ollis NP to lead it. Essentially comfort care  The patient is critically ill with multiple organ systems failure and requires high complexity decision making for assessment and support, frequent evaluation and titration of therapies, application of advanced monitoring technologies and extensive interpretation of multiple databases.   Critical Care Time devoted to patient care services described in this note is  35  Minutes.  Dr. Murali Ramaswamy, M.D., F.C.C.P Pulmonary and Critical Care Medicine Staff Physician [facility] Pager: [phone], If no answer or between  15:00h - 7:00h: call [phone]  09/21/2013 11:02 AM             

## 2013-09-21 NOTE — Care Management Note (Signed)
    Page 1 of 2   09/21/2013     2:26:52 PM   CARE MANAGEMENT NOTE 09/21/2013  Patient:  Robert Berger, Robert Berger   Account Number:  [id number]  Date Initiated:  09/10/2013  Documentation initiated by:  DAVIS,TYMEEKA  Subjective/Objective Assessment:   73 yo male admitted with severe low back pain.     Action/Plan:   Home when stable   Anticipated DC Date:  09/15/2013   Anticipated DC Plan:  [place]  CM consult      Choice offered to / List presented to:  C-1 Patient   DME arranged  NA      DME agency  NA     [place] arranged  NA      [place] agency  NA   Status of service:  In process, will continue to follow Medicare Important Message given?   (If response is "NO", the following Medicare IM given date fields will be blank) Date Medicare IM given:   Date Additional Medicare IM given:    Discharge Disposition:    Per UR Regulation:  Reviewed for med. necessity/level of care/duration of stay  If discussed at [place] of Stay Meetings, dates discussed:    Comments:  09/21/13 Seraj Dunnam RN,BSN NCM [phone] MD HAD LONG DISCUSSION W/FAMILY.NOTED FENTANYL GTT,ATIVAN GTT,ONCE COMFORTABLE WILL EXTUBATE TO [place].DNR.  Mechele Claude, RN Registered Nurse Signed CASE MANAGEMENT Care Management Note Service date: 09/12/2013 11:36 AM Cm continues to follow concerning discharge needs. 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 Per physician awaiting results from a diagnostic test prior to ordering the chemo. Cm spoke with patient concerning PT recommendations for HHPT vs SNF. Pt states previously at [employer]. Pt informed of process concerning SNF placement with payer. Will continue to follow.    Mechele Claude, RN Registered Nurse Signed CASE MANAGEMENT Care Management Note Service date: 09/10/2013 12:53 PM Cm spoke with patient and spouse at bedside concerning discharge needs per MD order. Per pt  currently active with [facility] for RN/PT. Pt states [facility] provides home oxygen. Per pt's spouse unable to transport pt to physician appointments. Cm offered pt and spouse information concerning private duty care. Pt's spouse states unable to pay out of pocket cost. Cm questioned pt if considering SNF placement, pt states unsure at this time. Awaiting PT eval. If HH recommending, MD please order PT/OT/RN/Aide/Sw. Sw can assist pt with transition into SNF if further declines. Will continue to monitor.    09/10/13 [place] Chart reviewed for utilization of services. Cm to consult concerning HH needs per MD order. Awaiting PT eval.

## 2013-09-21 NOTE — Progress Notes (Signed)
[place] Progress Note Patient Name: Robert Berger DOB: [birth year] MRN: [id number]  Date of Service  09/21/2013   HPI/Events of Note  Call from nurse reporting tachycardia with HR to the 160s.  Had received fluid bolus earlier.  Now with HR in the 120s with BP of 115/57 (73).  Is net positive for greater than 6 liters over the past 48 hours.   eICU Interventions  Plan: One time dose of lopressor 5 mg IV and assess response.   Intervention Category Intermediate Interventions: Arrhythmia - evaluation and management  Jania Steinke 09/21/2013, 12:39 AM

## 2013-09-21 NOTE — Progress Notes (Signed)
CRITICAL VALUE ALERT  Critical value received:Hgb 6.4, Ca 5.7, Platelets 20, phos 9.6  Date of notification:  09/21/13  Time of notification:  0615  Critical value read back:yes  Nurse who received alert:  Jari Favre RN  MD notified (1st page):  E-Link  Time of first page:  0622  MD notified (2nd page):  Time of second page:  Responding MD:  Dr Jimmy Footman  Time MD responded:  [phone]

## 2013-09-21 NOTE — Progress Notes (Signed)
[place] Progress Note Patient Name: Robert Berger DOB: Apr 15, 1941 MRN: [id number]  Date of Service  09/21/2013   HPI/Events of Note  Hgd remains low at 6.4 with plt of 20.  No obvious bleeding.   eICU Interventions  Plan: Transfuse 1 u pRBC Post-transfusion CBC and BMET   Intervention Category Intermediate Interventions: Bleeding - evaluation and treatment with blood products  DETERDING,ELIZABETH 09/21/2013, 6:51 AM

## 2013-09-21 NOTE — Progress Notes (Signed)
Spoke with pt's mother, significant other at bedside concerning questions about cremation, death certificates, urns and health insurances. Instructed they to call health insurance to check on policy. Pt's mother request [facility].  A call to [facility] [phone], spoke with Rosaura Carpenter who state they will assist the family with all their needs, death certificates, urn and arrnagements. However, [facility] is not open on the weekends, but will come pick the body up. Contact numbers given to [facility] with [employer].  This information was given to pt's mother and significant other at bedside.

## 2013-09-21 NOTE — Progress Notes (Signed)
Provided spiritual support with family around anticipatory grief.  Engaged in life review, memorialized pt's relationship with family, provided grief education and planning around next steps in grief process.

## 2013-09-21 NOTE — Progress Notes (Signed)
Dr. Brand Males, M.D., [facility].C.P Pulmonary and Critical Care Medicine Staff Physician [place] [facility] Pager: [phone], If no answer or between  15:00h - 7:00h: call [phone]  09/21/2013 1:21 PM

## 2013-09-21 NOTE — Progress Notes (Signed)
eLink Physician-Brief Progress Note Patient Name: Robert Berger DOB: 08-17-1940 MRN: [id number]  Date of Service  09/21/2013   HPI/Events of Note  Patient with metabolic acidosis with pH of 7.21/32/115/12.5.  Also with calcium of 5.7 uncorrected.   eICU Interventions  Plan: Increase RR on vent from 18 to 24 Add Na Bicarb tablets 650 mg TID via tube  Replace Calcium   Intervention Category Major Interventions: Acid-Base disturbance - evaluation and management  Monya Kozakiewicz 09/21/2013, 6:21 AM

## 2013-09-21 NOTE — Progress Notes (Addendum)
Unfortunately, is apparent that [name] is not going to survive this crisis. He's going into renal failure. He is becoming immunized to blood products.  His chest x-ray looks about the same.  I spoke to his girlfriend. She is wondering really is making the health care decisions for him. She and his mother both feel that he would not want to be like this and would not want to be an invalid upon any recovery.  I spoke to them about end of life issues now. They do not want him to be kept alive on a machine. They do not want him to have any further transfusions. They feel that we get him off the ventilator and let him pass is what he would want to have happen.  I agree with this. I just do not see that his condition is going to improve. He has a refractory hematologic malignancy. He has progressive renal failure. 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  I feel that a terminal wean from the ventilator is very appropriate.  I will speak with critical care so that they may initiate this.  We will discontinue his medications. I'm sure critical care well manage his sedation as he is weaned from the vent.  It is really hate that [name] has declined so quickly and critically.  He is tried as much as anybody could and his myeloma has just converted to an incredibly aggressive malignancy.  I do thank the staff in the ICU for there outstanding and compassionate care.  Lum Keas  2 Timothy 4:18

## 2013-09-21 NOTE — Progress Notes (Signed)
The radiologist called and said the et tube was in the right bronchus and needed to be retracted. I pulled tube back from 25 to 23.

## 2013-09-22 LAB — CULTURE, BAL-QUANTITATIVE W GRAM STAIN

## 2013-09-22 LAB — CULTURE, BAL-QUANTITATIVE

## 2013-09-24 ENCOUNTER — Ambulatory Visit: Payer: BC Managed Care – PPO

## 2013-09-24 LAB — FUNGUS CULTURE W SMEAR

## 2013-09-25 ENCOUNTER — Ambulatory Visit: Payer: BC Managed Care – PPO

## 2013-09-25 LAB — LEGIONELLA CULTURE

## 2013-09-26 ENCOUNTER — Ambulatory Visit: Payer: BC Managed Care – PPO

## 2013-09-27 ENCOUNTER — Ambulatory Visit: Payer: BC Managed Care – PPO

## 2013-10-01 IMAGING — CT CT ABD-PELV W/ CM
1 of 3 series · 14 of 32 positions shown, 19 images · IV contrast (100 ML OMNI 300)
Comparison: 05/13/2012.  12/20/2012

CLINICAL DATA: Abdominal pain.Multiple myeloma.

CT ABDOMEN AND PELVIS WITH CONTRAST
TECHNIQUE: Multidetector CT imaging of the abdomen and pelvis was
performed following the standard protocol during bolus
administration of intravenous contrast.
Contrast: 100mL OMNIPAQUE IOHEXOL 300 MG/ML  SOLN

[Series 2: abd/pel with · axial · 0.79mm/px · z∈[+1315,+1720]mm · 14 of 91 slices shown, 19 images]
[im 5/91  soft-tissue]
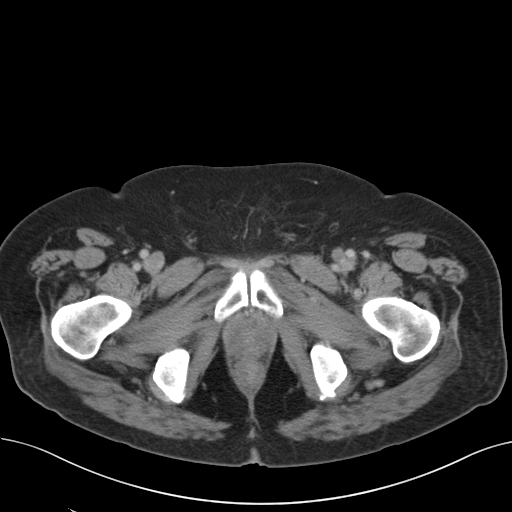
[im 5/91  bone]
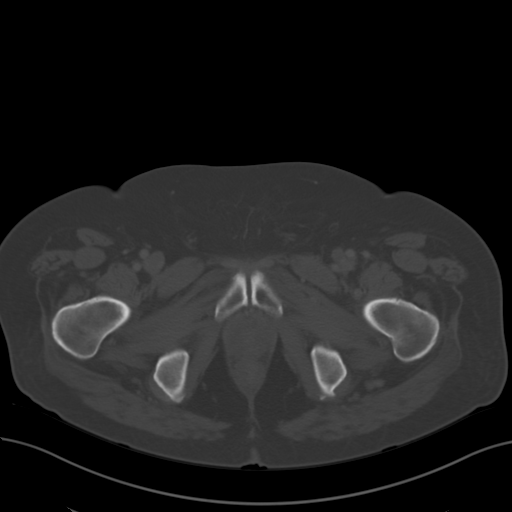
[im 15/91  soft-tissue]
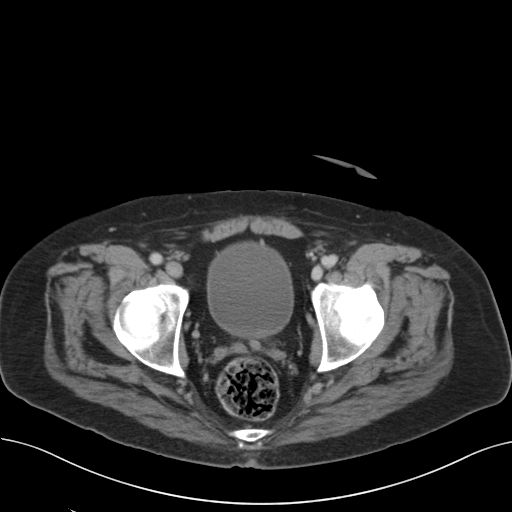
[im 19/91  soft-tissue]
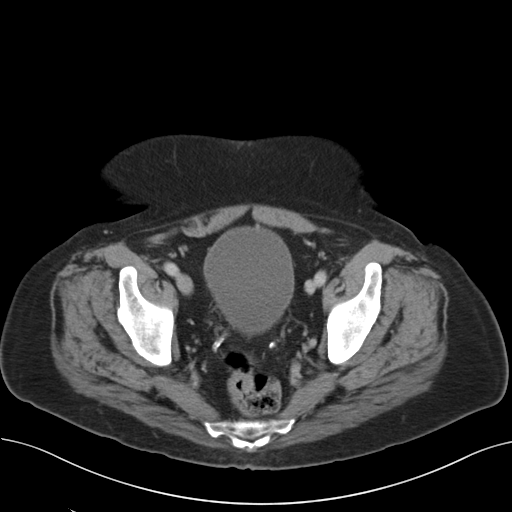
[im 24/91  soft-tissue]
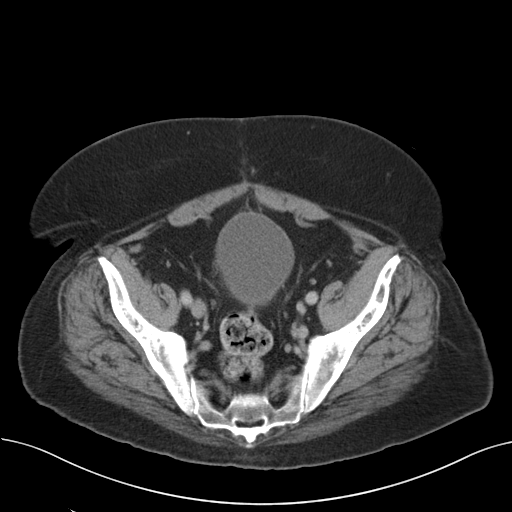
[im 34/91  soft-tissue]
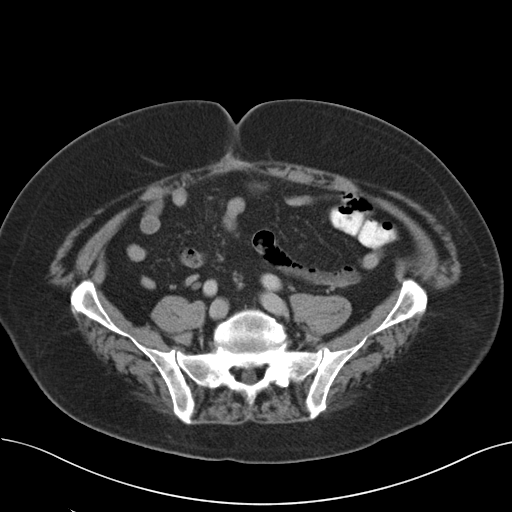
[im 38/91  soft-tissue]
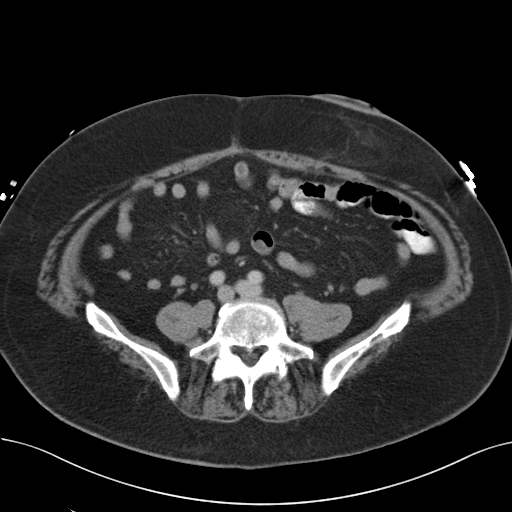
[im 48/91  soft-tissue]
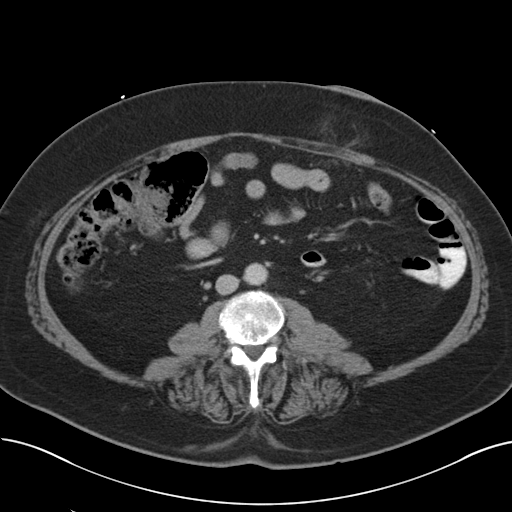
[im 53/91  soft-tissue]
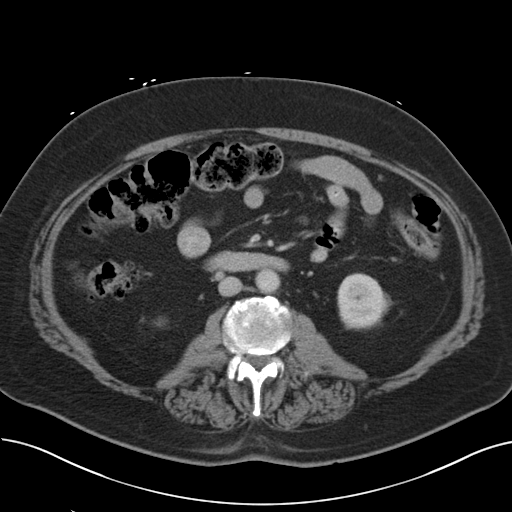
[im 57/91  soft-tissue]
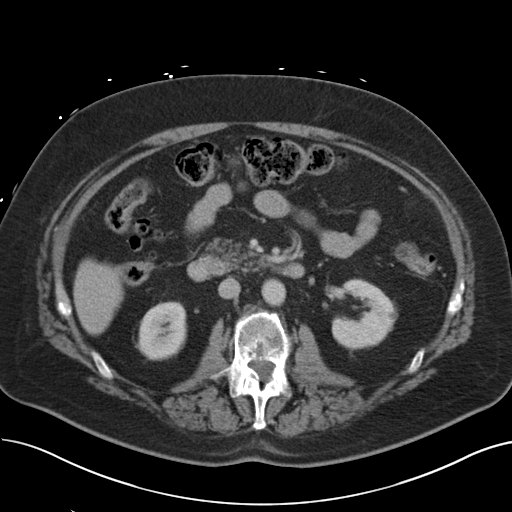
[im 57/91  bone]
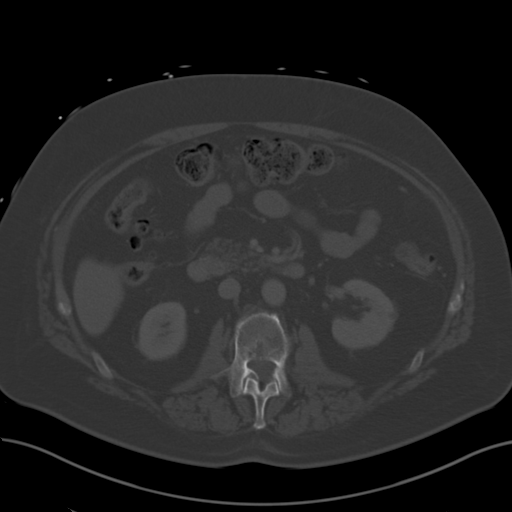
[im 67/91  soft-tissue]
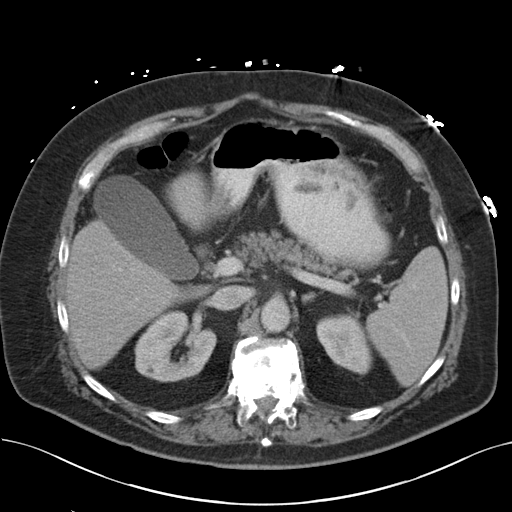
[im 72/91  soft-tissue]
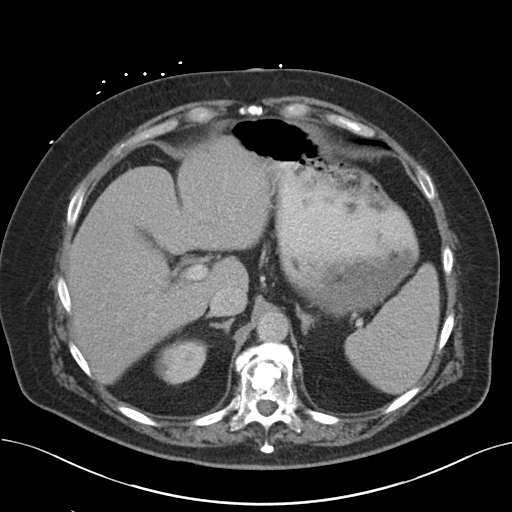
[im 72/91  lung]
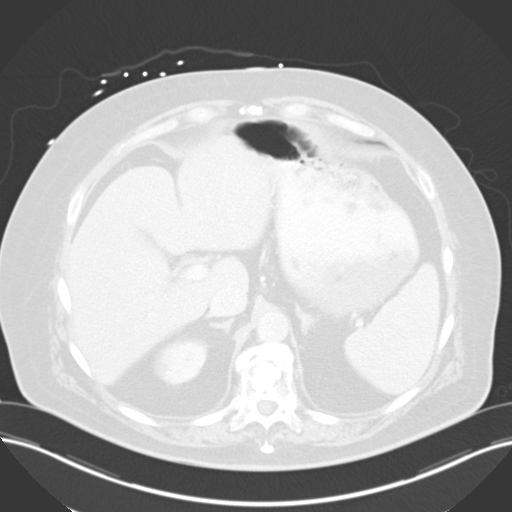
[im 76/91  soft-tissue]
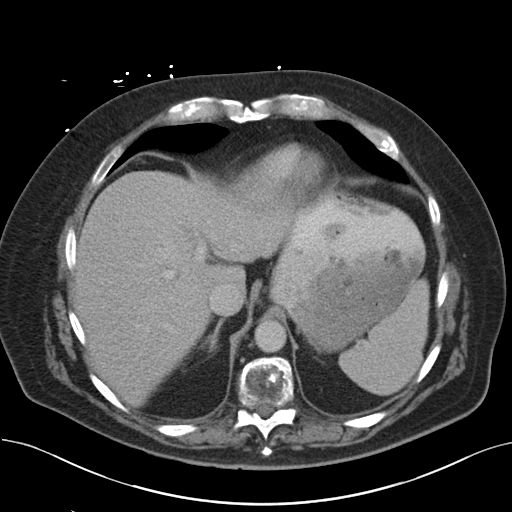
[im 76/91  lung]
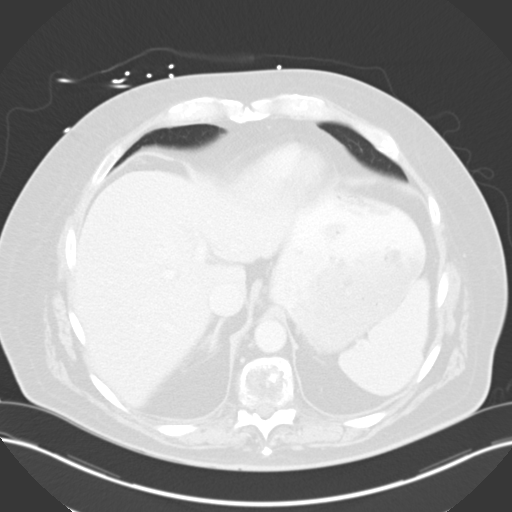
[im 81/91  lung]
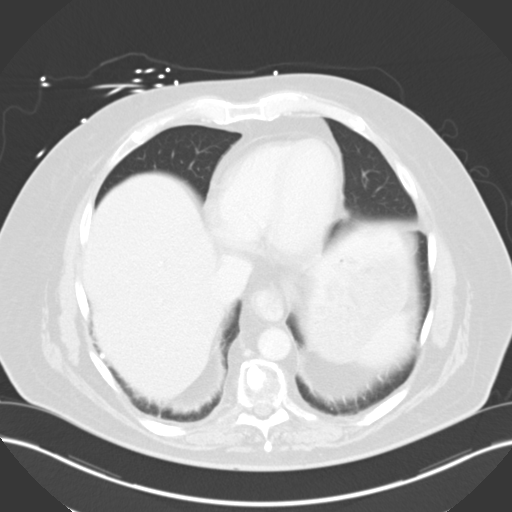
[im 86/91  soft-tissue]
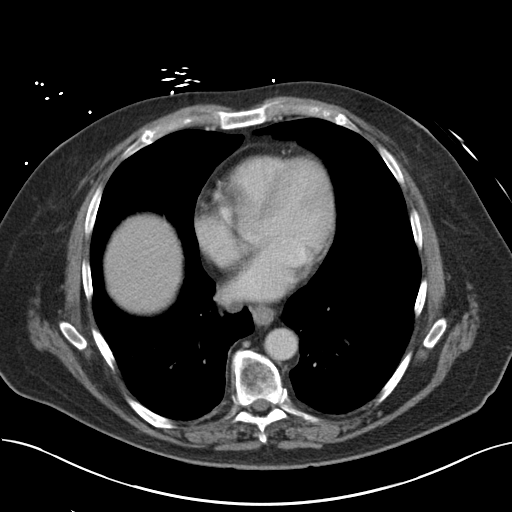
[im 86/91  lung]
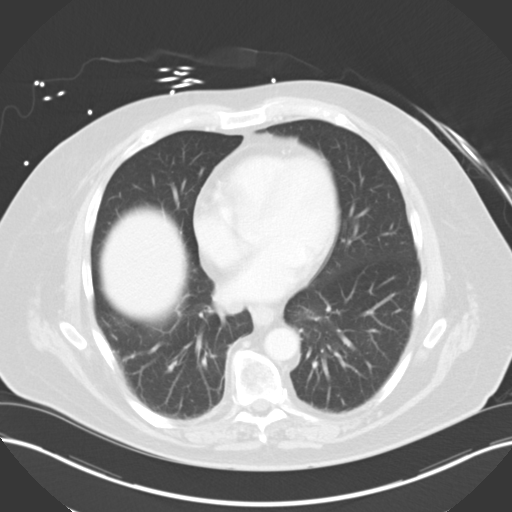

[14 of 32 positions shown; findings below may reference images not displayed]

FINDINGS: Lung Bases: Dependent atelectasis. Coronary artery
atherosclerosis is present. If office based assessment of coronary
risk factors has not been performed, it is now recommended.

Liver:  Normal.

Spleen:  Normal.

Gallbladder:  Distended, likely secondary to fasting state.

Common bile duct:  Normal.

Pancreas:  Normal.

Adrenal glands:  Normal.

Kidneys:  Normal enhancement.  Good delayed excretion of contrast.
The ureters appear within normal limits.  Tiny bilateral renal
cysts are present.

Stomach:  Small hiatal hernia.  Patulous gastroesophageal junction.

Small bowel:  Normal.  No obstruction. No mesenteric adenopathy.
Small parastomal hernia without complicating features.

Colon:   Descending end colostomy with Hartmann's pouch. Colonic
diverticulosis.

Pelvic Genitourinary:  Urinary bladder appears normal.
Prostatomegaly.  No free fluid.  No pelvic adenopathy.

Bones:  Vertebral augmentation at multiple levels of the
thoracolumbar spine.  Faint osteolytic lesions in the iliac bones
bilaterally.  This is compatible with the given history of multiple
myeloma, presumably treated.

Vasculature: No acute vascular abnormality.  Mild atherosclerosis.

Body Wall: Scarring in the midline.  Fat containing left lower
quadrant parastomal hernia.
IMPRESSION: 1.  No acute abnormality.
2.  Left lower quadrant end colostomy with Hartmann's pouch.  Small
parastomal hernia containing small bowel without obstruction.
3.  Small hiatal hernia.
4.  Faint osteolytic lesions of multiple myeloma along with
thoracolumbar compression fractures and vertebral augmentation.

## 2013-10-07 NOTE — Progress Notes (Signed)
Spoke with pt 2 estranged daughters whom say they live [place].  They were crying and voiced that they hadn't seen their father in 15 years.  Informed them that there had been no change in pt status or care.  Informed them that I could only give out limited information over the phone d/t HIPPA.  Encouraged them to call pt emergency contacts to get any updates.  Agreed that they would do so, mainly they were concerned if they had enough time to get here to see pt before he expires. Expressed to them that I did not have that answer.  However, informed them they were welcome to visit pt if they were here.    Dyann Ruddle, RN

## 2013-10-07 NOTE — Discharge Summary (Signed)
NAMEMarland Berger  QUITMAN, NORBERTO NO.:  [id number]  MEDICAL RECORD NO.:  [id number]  LOCATION:  33                         FACILITY:  [facility]  PHYSICIAN:  Volanda Napoleon, M.D.  DATE OF BIRTH:  1940-10-20  DATE OF ADMISSION:  08/21/2013 DATE OF DISCHARGE:  01-Oct-2013                              DISCHARGE SUMMARY   DIAGNOSES UPON DEATH: 1. Relapsed multiple myeloma/plasma cell leukemia. 2. Respiratory failure secondary to pneumonia versus pneumonitis. 3. Compression fractures of the spine secondary to myeloma recurrence. 4. Progressive renal failure. 5. Pancytopenia secondary to myeloma. 6. Radiation therapy for pain control. 7. Chemotherapy for potential symptom palliation.  HOSPITAL COURSE:  Robert Berger was admitted from my office on January 30. We admitted him initially because of severe pain issues.  He just was not doing well at home.  He was having a lot of pain.  He could not be managed at home.  We admitted him.  He already had a Port-A-Cath in place.  So, we went ahead and accessed his Port-A-Cath.  He was on a fentanyl patch.  We increased his patch.  We also gave him some IV Dilaudid.  We were finally able to get an MRI on his back.  This was done on February 3.  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  He had multiple areas of involvement by myelomatous lesions.  MRI of the thoracic spine done on February 4 confirmed the T10 soft tissue tumor. Again, there is no frank cord compression causing neurological symptoms.  We got Radiation/Oncology involved.  They were to again take him down to start palliative radiation.  His blood counts began to decline.  Again, I felt this was all reflective of myelomatous activity.  On his blood smear, plasma cells were noted.  It became apparent that his myeloma was transforming, if not already transformed to plasma cell leukemia.  Based on this, I felt that we needed to try chemotherapy to try to  help with symptom management and to try to help with some kind of regression.  His myeloma had a multiple, multiple chromosomal abnormalities, which indicated a highly aggressive nature of his myeloma.  Again, I felt that he likely was going into plasma cell leukemia.  We went ahead and started him on chemotherapy.  We gave him Velcade/Decadron-Cisplatin/Adriamycin/Cytoxan/etoposide.  He started chemotherapy on February 9.  He was getting transfused.  He was thrombocytopenic.  His platelet count began to drop significantly.  Again, we were trying to transfuse him to minimize bleeding risk.  Of note, we did do influenza studies on him.  These were all negative.  He then began to develop some respiratory issues.  A chest x-ray done on February 10, this started show pulmonary airspace disease bilaterally. Of note, he had previously been hospitalized because of bilateral pneumonia.  He actually was intubated at that time.  He wish to maintain an aggressive nature of therapy.  We started him on IV antibiotics.  He got Primaxin.  Unfortunately, the pulmonary infiltrates progressed.  On February 11, they worsened.  I got Pulmonary involved.  Thankfully, Robert Berger was seen by them right before he declined.  He had  to be intubated on February 11.  I very much appreciated Pulmonary's help.  They did a bronchoscopy on him.  I was worried about him having alveolar hemorrhaging from his myeloma.  Cytology that was done on the bronchoscopy showed no malignant cells that were identified.  He had been placed on a pressor support.  He was placed on broad- spectrum antibiotics in the ICU.  I spoke to his common-law wife.  His girlfriend had been with him for over 20 years.  She and his mother and I talked about end of life issues.  They felt that he would not want to be kept alive if he knew that he had very little chance of improving.  I did not feel that he was going to have any  improvement in his quality of life given the aggressive nature of his myeloma and the fact that it transformed into a plasma cell leukemia.  We decided on February 13th to begin withdrawal of life support measures.  He was made a do not resuscitate.  I very much appreciated Critical Care helping [place] out with his ventilation issues.  It also became apparent that his kidneys were starting to fail him.  On February 11, his BUN and creatinine were 37 and 0.79.  On February 13, his BUN and creatinine were 89 and 2.47.  He had a uric acid of 16.1.  Of note, his pre-albumin that we checked was 4.  This was a very ominous prognostic feature.  He was on sedation during his ventilation.  He was maintained on sedation.  He was subsequently extubated on February 13.  His family was with him.  He did have 2 daughters who unfortunately could not make it to the hospital.  He was kept comfortable.  He subsequently passed on the morning of Oct 01, 2022 at 04:45 a.m.  Mr. Limones certainly tried his best.  He had incredibly aggressive myeloma once it relapsed and he just was not able to benefit from any of our interventions.  Again, I appreciate everybody's help in his care during his final hospitalization.  With regrets, this is a death summary for [employer].     Volanda Napoleon, M.D.     PRE/MEDQ  D:  Oct 01, 2013  T:  October 01, 2013  Job:  [id number]

## 2013-10-07 NOTE — Progress Notes (Signed)
Expected death occurred at 0455 am pt has no spontaneous signs of life.  Asystole on monitor and no heart sounds auscultated verified by 2 RN's.  Family at the bedside and MD made aware. Lorretta Harp, RN and Stacy Gardner, RN.

## 2013-10-07 NOTE — Progress Notes (Signed)
#   [id number] is death summary.  Pete E.

## 2013-10-07 NOTE — Progress Notes (Signed)
Pt family at bedside, called d/t pt SBP started to decrease to the 70's. Dyann Ruddle, RN

## 2013-10-07 NOTE — Progress Notes (Signed)
Fentanyl IV gtt remainder wasted 80 ml (72mcg/ml) and Ativan gtt remainder wasted 15 ml (1mg /ml). Lorretta Harp, RN and Chanda Busing.

## 2013-10-07 DEATH — deceased

## 2013-10-10 LAB — AFB CULTURE WITH SMEAR (NOT AT ARMC): ACID FAST SMEAR: NONE SEEN

## 2013-10-15 LAB — FUNGUS CULTURE W SMEAR: Fungal Smear: NONE SEEN

## 2013-10-22 NOTE — Progress Notes (Signed)
[facility] End of Treatment Note  Name:Robert Berger  Date: 09/18/2013 VHQ:[id number] DOB:Sep 08, 1940     DIAGNOSIS: multiple myeloma    INDICATION FOR TREATMENT: Palliative    TREATMENT DATES:  2-4 to 09-18-13                         SITE/DOSE:            T6-L3 spine / 22.5 Gray in 10 fractions planned; he only received 11.25 Gray in 5 fractions                BEAMS/ENERGY:     AP/PA /    15 MV photons          NARRATIVE:       Sadly, Mr. Solivan died before treatment was completed, due to the aggressiveness of his relapsed systemic disease.                    -----------------------------------  Eppie Gibson, MD

## 2013-11-01 LAB — AFB CULTURE WITH SMEAR (NOT AT ARMC): Acid Fast Smear: NONE SEEN

## 2014-01-01 ENCOUNTER — Encounter (HOSPITAL_COMMUNITY): Payer: Self-pay

## 2014-04-03 ENCOUNTER — Other Ambulatory Visit: Payer: Self-pay | Admitting: Pharmacist
